# Patient Record
Sex: Male | Born: 1960 | Race: White | Hispanic: No | Marital: Married | Smoking: Former smoker
Health system: Southern US, Community
[De-identification: ages and names within clinical notes are randomized; demographics above are authoritative.]

## PROBLEM LIST (undated history)

## (undated) DIAGNOSIS — J84112 Idiopathic pulmonary fibrosis: Secondary | ICD-10-CM

## (undated) DIAGNOSIS — I639 Cerebral infarction, unspecified: Secondary | ICD-10-CM

## (undated) DIAGNOSIS — C801 Malignant (primary) neoplasm, unspecified: Secondary | ICD-10-CM

## (undated) DIAGNOSIS — Z9289 Personal history of other medical treatment: Secondary | ICD-10-CM

## (undated) DIAGNOSIS — Z85118 Personal history of other malignant neoplasm of bronchus and lung: Secondary | ICD-10-CM

## (undated) DIAGNOSIS — G43909 Migraine, unspecified, not intractable, without status migrainosus: Secondary | ICD-10-CM

## (undated) DIAGNOSIS — I2510 Atherosclerotic heart disease of native coronary artery without angina pectoris: Secondary | ICD-10-CM

## (undated) DIAGNOSIS — J8410 Pulmonary fibrosis, unspecified: Secondary | ICD-10-CM

## (undated) DIAGNOSIS — J449 Chronic obstructive pulmonary disease, unspecified: Secondary | ICD-10-CM

## (undated) DIAGNOSIS — T63301A Toxic effect of unspecified spider venom, accidental (unintentional), initial encounter: Secondary | ICD-10-CM

## (undated) DIAGNOSIS — J45909 Unspecified asthma, uncomplicated: Secondary | ICD-10-CM

## (undated) DIAGNOSIS — M549 Dorsalgia, unspecified: Secondary | ICD-10-CM

## (undated) DIAGNOSIS — R1310 Dysphagia, unspecified: Secondary | ICD-10-CM

## (undated) DIAGNOSIS — G1221 Amyotrophic lateral sclerosis: Secondary | ICD-10-CM

## (undated) DIAGNOSIS — M5136 Other intervertebral disc degeneration, lumbar region: Secondary | ICD-10-CM

## (undated) DIAGNOSIS — F329 Major depressive disorder, single episode, unspecified: Secondary | ICD-10-CM

## (undated) DIAGNOSIS — M51369 Other intervertebral disc degeneration, lumbar region without mention of lumbar back pain or lower extremity pain: Secondary | ICD-10-CM

## (undated) DIAGNOSIS — F32A Depression, unspecified: Secondary | ICD-10-CM

## (undated) HISTORY — DX: Migraine, unspecified, not intractable, without status migrainosus: G43.909

## (undated) HISTORY — PX: CARDIAC CATHETERIZATION: SHX172

## (undated) HISTORY — DX: Other intervertebral disc degeneration, lumbar region without mention of lumbar back pain or lower extremity pain: M51.369

## (undated) HISTORY — DX: Idiopathic pulmonary fibrosis: J84.112

## (undated) HISTORY — DX: Amyotrophic lateral sclerosis: G12.21

## (undated) HISTORY — DX: Malignant (primary) neoplasm, unspecified: C80.1

## (undated) HISTORY — DX: Personal history of other malignant neoplasm of bronchus and lung: Z85.118

## (undated) HISTORY — DX: Other intervertebral disc degeneration, lumbar region: M51.36

## (undated) HISTORY — DX: Cerebral infarction, unspecified: I63.9

## (undated) HISTORY — PX: BACK SURGERY: SHX140

## (undated) HISTORY — DX: Atherosclerotic heart disease of native coronary artery without angina pectoris: I25.10

---
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
## 1988-08-01 HISTORY — PX: OTHER SURGICAL HISTORY: SHX169

## 1988-08-01 HISTORY — PX: VASECTOMY: SHX75

## 1998-08-01 HISTORY — PX: LUMBAR FUSION: SHX111

## 1999-07-11 ENCOUNTER — Encounter: Payer: Self-pay | Admitting: Emergency Medicine

## 1999-07-11 ENCOUNTER — Emergency Department (HOSPITAL_COMMUNITY): Admission: EM | Admit: 1999-07-11 | Discharge: 1999-07-11 | Payer: Self-pay | Admitting: Emergency Medicine

## 1999-08-02 HISTORY — PX: CORONARY ANGIOPLASTY: SHX604

## 1999-09-27 ENCOUNTER — Encounter: Admission: RE | Admit: 1999-09-27 | Discharge: 1999-09-27 | Payer: Self-pay | Admitting: *Deleted

## 1999-10-27 ENCOUNTER — Encounter: Payer: Self-pay | Admitting: Neurosurgery

## 1999-10-29 ENCOUNTER — Encounter: Payer: Self-pay | Admitting: Neurosurgery

## 1999-10-29 ENCOUNTER — Inpatient Hospital Stay (HOSPITAL_COMMUNITY): Admission: RE | Admit: 1999-10-29 | Discharge: 1999-11-01 | Payer: Self-pay | Admitting: Neurosurgery

## 1999-10-31 ENCOUNTER — Encounter: Payer: Self-pay | Admitting: Neurosurgery

## 1999-12-14 ENCOUNTER — Encounter: Payer: Self-pay | Admitting: Neurosurgery

## 1999-12-14 ENCOUNTER — Encounter: Admission: RE | Admit: 1999-12-14 | Discharge: 1999-12-14 | Payer: Self-pay | Admitting: Neurosurgery

## 2000-04-25 ENCOUNTER — Encounter: Payer: Self-pay | Admitting: Neurosurgery

## 2000-04-25 ENCOUNTER — Encounter: Admission: RE | Admit: 2000-04-25 | Discharge: 2000-04-25 | Payer: Self-pay | Admitting: Neurosurgery

## 2000-06-05 ENCOUNTER — Encounter: Payer: Self-pay | Admitting: Neurosurgery

## 2000-06-05 ENCOUNTER — Encounter: Admission: RE | Admit: 2000-06-05 | Discharge: 2000-06-05 | Payer: Self-pay | Admitting: Neurosurgery

## 2000-06-20 ENCOUNTER — Encounter: Admission: RE | Admit: 2000-06-20 | Discharge: 2000-06-20 | Payer: Self-pay | Admitting: Neurosurgery

## 2000-06-20 ENCOUNTER — Encounter: Payer: Self-pay | Admitting: Neurosurgery

## 2001-02-06 ENCOUNTER — Emergency Department (HOSPITAL_COMMUNITY): Admission: EM | Admit: 2001-02-06 | Discharge: 2001-02-06 | Payer: Self-pay | Admitting: *Deleted

## 2001-02-16 ENCOUNTER — Emergency Department (HOSPITAL_COMMUNITY): Admission: EM | Admit: 2001-02-16 | Discharge: 2001-02-16 | Payer: Self-pay

## 2001-05-28 ENCOUNTER — Emergency Department (HOSPITAL_COMMUNITY): Admission: EM | Admit: 2001-05-28 | Discharge: 2001-05-29 | Payer: Self-pay | Admitting: Unknown Physician Specialty

## 2003-07-09 ENCOUNTER — Emergency Department (HOSPITAL_COMMUNITY): Admission: EM | Admit: 2003-07-09 | Discharge: 2003-07-09 | Payer: Self-pay | Admitting: Emergency Medicine

## 2004-08-04 ENCOUNTER — Ambulatory Visit: Payer: Self-pay | Admitting: Cardiology

## 2004-11-01 ENCOUNTER — Emergency Department (HOSPITAL_COMMUNITY): Admission: EM | Admit: 2004-11-01 | Discharge: 2004-11-01 | Payer: Self-pay | Admitting: Emergency Medicine

## 2005-04-21 ENCOUNTER — Emergency Department (HOSPITAL_COMMUNITY): Admission: EM | Admit: 2005-04-21 | Discharge: 2005-04-22 | Payer: Self-pay | Admitting: Emergency Medicine

## 2007-08-02 HISTORY — PX: CHOLECYSTECTOMY: SHX55

## 2010-08-01 DIAGNOSIS — T63301A Toxic effect of unspecified spider venom, accidental (unintentional), initial encounter: Secondary | ICD-10-CM

## 2010-08-01 HISTORY — DX: Toxic effect of unspecified spider venom, accidental (unintentional), initial encounter: T63.301A

## 2011-08-29 ENCOUNTER — Encounter (HOSPITAL_COMMUNITY): Payer: Self-pay | Admitting: *Deleted

## 2011-08-29 ENCOUNTER — Emergency Department (HOSPITAL_COMMUNITY)
Admission: EM | Admit: 2011-08-29 | Discharge: 2011-08-30 | Disposition: A | Discharge status: Home / Self Care | Payer: BC Managed Care – PPO | Attending: Emergency Medicine | Admitting: Emergency Medicine

## 2011-08-29 DIAGNOSIS — Z7982 Long term (current) use of aspirin: Secondary | ICD-10-CM | POA: Insufficient documentation

## 2011-08-29 DIAGNOSIS — W108XXA Fall (on) (from) other stairs and steps, initial encounter: Secondary | ICD-10-CM | POA: Insufficient documentation

## 2011-08-29 DIAGNOSIS — M549 Dorsalgia, unspecified: Secondary | ICD-10-CM

## 2011-08-29 DIAGNOSIS — M545 Low back pain, unspecified: Secondary | ICD-10-CM | POA: Insufficient documentation

## 2011-08-29 DIAGNOSIS — G8929 Other chronic pain: Secondary | ICD-10-CM | POA: Insufficient documentation

## 2011-08-29 DIAGNOSIS — W19XXXA Unspecified fall, initial encounter: Secondary | ICD-10-CM

## 2011-08-29 DIAGNOSIS — F17200 Nicotine dependence, unspecified, uncomplicated: Secondary | ICD-10-CM | POA: Insufficient documentation

## 2011-08-29 DIAGNOSIS — Z981 Arthrodesis status: Secondary | ICD-10-CM | POA: Insufficient documentation

## 2011-08-29 HISTORY — DX: Dorsalgia, unspecified: M54.9

## 2011-08-29 MED ORDER — IBUPROFEN 800 MG PO TABS
800.0000 mg | ORAL_TABLET | Freq: Once | ORAL | Status: AC
  Administered 2011-08-30: 800 mg via ORAL
  Filled 2011-08-29: qty 1

## 2011-08-29 MED ORDER — OXYCODONE-ACETAMINOPHEN 5-325 MG PO TABS
2.0000 | ORAL_TABLET | Freq: Once | ORAL | Status: AC
  Administered 2011-08-30: 2 via ORAL
  Filled 2011-08-29: qty 2

## 2011-08-29 NOTE — ED Notes (Signed)
C/o lower back pain s/p fall down six steps this afternoon; hx of chronic back pain/back surgery

## 2011-08-30 ENCOUNTER — Emergency Department (HOSPITAL_COMMUNITY): Payer: BC Managed Care – PPO

## 2011-08-30 MED ORDER — OXYCODONE-ACETAMINOPHEN 5-325 MG PO TABS: 1.0000 | ORAL_TABLET | ORAL | Status: AC | PRN

## 2011-08-30 MED ORDER — IBUPROFEN 800 MG PO TABS: 800.0000 mg | ORAL_TABLET | Freq: Three times a day (TID) | ORAL | Status: DC

## 2011-08-30 NOTE — ED Notes (Signed)
Pt reports he fell down stairs.  Reports severe pain in lower back.  Pt does grimace with movement and reports sharp stabbing pain with position changes. Medicated for pain.

## 2011-08-30 NOTE — ED Provider Notes (Addendum)
History     CSN: [id number]  Arrival date & time 08/29/11  2335   First MD Initiated Contact with Patient 08/29/11 2350      Chief Complaint  Patient presents with  . Fall  . Back Pain    lower    (Consider location/radiation/quality/duration/timing/severity/associated sxs/prior treatment) HPI Bradley Rice is a 51 y.o. male who presents to the Emergency Department complaining of back pain after falling down 6 stairs. He has a h/o chronic pain to the lower back after having back surgery.Has not taken any OTC medicines.  Past Medical History  Diagnosis Date  . Back pain     Past Surgical History  Procedure Date  . Cholecystectomy     No family history on file.  History  Substance Use Topics  . Smoking status: Current Everyday Smoker -- 0.5 packs/day    Types: Cigarettes  . Smokeless tobacco: Not on file  . Alcohol Use: No      Review of Systems A 10 review of systems reviewed and are negative for acute change except as noted in the HPI. Allergies  Review of patient's allergies indicates no known allergies.  Home Medications   Current Outpatient Rx  Name Route Sig Dispense Refill  . ASPIRIN 325 MG PO TABS Oral Take 325 mg by mouth daily.      Ht 6' (1.829 m)  Wt 185 lb (83.915 kg)  BMI 25.09 kg/m2  Physical Exam  Nursing note and vitals reviewed. Constitutional: He is oriented to person, place, and time. He appears well-developed and well-nourished.  HENT:  Head: Normocephalic.  Right Ear: External ear normal.  Nose: Nose normal.  Mouth/Throat: Oropharynx is clear and moist.  Eyes: EOM are normal. Pupils are equal, round, and reactive to light.  Neck: Normal range of motion. Neck supple.  Cardiovascular: Normal rate, normal heart sounds and intact distal pulses.   Pulmonary/Chest: Effort normal and breath sounds normal.  Abdominal: Soft. Bowel sounds are normal.  Musculoskeletal:       No spinal pain to percussion. Mild lumbar paraspinal tenderness  left greater than right with palpation.  Neurological: He is alert and oriented to person, place, and time. He has normal reflexes.  Skin: Skin is warm and dry.    ED Course  Procedures (including critical care time)  Labs Reviewed - No data to display Dg Lumbar Spine Complete  08/30/2011  *RADIOLOGY REPORT*  Clinical Data: Larey Seat on steps, prior surgery  LUMBAR SPINE - COMPLETE 4+ VIEW  Comparison: None available  Findings: Five non-rib bearing lumbar vertebrae. Osseous demineralization. Prior L5-S1 fusion and L5 laminectomy. Mild scattered end plate spur formation. Disc space narrowing L2-L3, L3-L4. Vertebral body heights maintained without fracture or subluxation. Hardware appears intact. No bone destruction. SI joints symmetric.  IMPRESSION: Prior L5-S1 fusion. Mild degenerative disc disease changes as above. No acute bony abnormalities.  Original Report Authenticated By: Lollie Marrow, M.D.       MDM  Patient with chronic back pain, s/p fusion, who fell and is having lowe back pain. Xray with no acute findings. Given analgesics and antiinflammatory.Pt stable in ED with no significant deterioration in condition.The patient appears reasonably screened and/or stabilized for discharge and I doubt any other medical condition or other [facility] requiring further screening, evaluation, or treatment in the ED at this time prior to discharge. Referral to orthopedics if needed.  MDM Reviewed: nursing note and vitals Interpretation: x-ray          Nicoletta Dress.  Colon Branch, MD 08/30/11 0050  Nicoletta Dress. Colon Branch, MD 09/20/11 1724

## 2011-08-30 NOTE — ED Notes (Signed)
Patient transported to X-ray 

## 2011-09-02 ENCOUNTER — Emergency Department (HOSPITAL_COMMUNITY): Payer: BC Managed Care – PPO

## 2011-09-02 ENCOUNTER — Emergency Department (HOSPITAL_COMMUNITY)
Admission: EM | Admit: 2011-09-02 | Discharge: 2011-09-02 | Disposition: A | Discharge status: Home / Self Care | Payer: BC Managed Care – PPO | Attending: Emergency Medicine | Admitting: Emergency Medicine

## 2011-09-02 ENCOUNTER — Encounter (HOSPITAL_COMMUNITY): Payer: Self-pay | Admitting: *Deleted

## 2011-09-02 DIAGNOSIS — M545 Low back pain, unspecified: Secondary | ICD-10-CM | POA: Insufficient documentation

## 2011-09-02 DIAGNOSIS — F17200 Nicotine dependence, unspecified, uncomplicated: Secondary | ICD-10-CM | POA: Insufficient documentation

## 2011-09-02 DIAGNOSIS — M25559 Pain in unspecified hip: Secondary | ICD-10-CM | POA: Insufficient documentation

## 2011-09-02 DIAGNOSIS — R52 Pain, unspecified: Secondary | ICD-10-CM | POA: Insufficient documentation

## 2011-09-02 MED ORDER — PREDNISONE 20 MG PO TABS
60.0000 mg | ORAL_TABLET | Freq: Once | ORAL | Status: AC
  Administered 2011-09-02: 60 mg via ORAL
  Filled 2011-09-02: qty 3

## 2011-09-02 MED ORDER — OXYCODONE-ACETAMINOPHEN 7.5-325 MG PO TABS: 1.0000 | ORAL_TABLET | ORAL | Status: DC | PRN

## 2011-09-02 MED ORDER — PREDNISONE 20 MG PO TABS: ORAL_TABLET | ORAL | Status: DC

## 2011-09-02 MED ORDER — OXYCODONE-ACETAMINOPHEN 5-325 MG PO TABS
2.0000 | ORAL_TABLET | Freq: Once | ORAL | Status: AC
  Administered 2011-09-02: 2 via ORAL
  Filled 2011-09-02: qty 2

## 2011-09-02 MED ORDER — HYDROMORPHONE HCL PF 1 MG/ML IJ SOLN
1.0000 mg | Freq: Once | INTRAMUSCULAR | Status: AC
  Administered 2011-09-02: 1 mg via INTRAMUSCULAR
  Filled 2011-09-02: qty 1

## 2011-09-02 NOTE — ED Notes (Signed)
Pt to return on Monday at 4 pm for MRI. Pt verbalized understanding.

## 2011-09-02 NOTE — ED Notes (Signed)
Pt a/ox4. Resp even and unlabored. NAD at this time. D/C instructions reviewed with pt. Pt verbalized understanding. Pt ambulated to lobby with steady gate.  

## 2011-09-02 NOTE — ED Notes (Signed)
Pt states that he fell on Monday, states that he was he coming down steps and fell, was seen in er on Monday, but is not able to be seen by [facility] specialist in [place] until two weeks from today, pt states that he continues to have back pain,

## 2011-09-04 NOTE — ED Provider Notes (Signed)
History     CSN: [id number]  Arrival date & time 09/02/11  7829   First MD Initiated Contact with Patient 09/02/11 1934      Chief Complaint  Patient presents with  . Back Pain    (Consider location/radiation/quality/duration/timing/severity/associated sxs/prior treatment) Patient is a 51 y.o. male presenting with back pain. The history is provided by the patient.  Back Pain  Chronicity: History of prior back injury and surgery, fell 4 days ago coming down a flight of 6 steps.  He landed first on his feet,  then fell backward onto his buttocks. The problem occurs constantly. The problem has not changed (He was seen here the day of the fall,  back xrays negative for acute injury,  but reports oxycodone prescribed has not relieved his pain.  He is scheduled to see [facility] neurosurgeon in 2 weeks.) since onset.The pain is present in the lumbar spine. The quality of the pain is described as stabbing and shooting. The pain radiates to the left foot (He also reports that if he sits straight with pressure on his coccyx,  he has pain that radiates to his posterior head.). The pain is at a severity of 10/10. The pain is severe. The symptoms are aggravated by certain positions. The pain is the same all the time. Pertinent negatives include no chest pain, no fever, no numbness, no headaches, no abdominal pain, no bowel incontinence, no perianal numbness, no bladder incontinence, no dysuria, no paresthesias, no paresis and no weakness. He has tried bed rest and analgesics for the symptoms. The treatment provided no relief.    Past Medical History  Diagnosis Date  . Back pain     Past Surgical History  Procedure Date  . Cholecystectomy   . Back surgery     History reviewed. No pertinent family history.  History  Substance Use Topics  . Smoking status: Current Everyday Smoker -- 0.5 packs/day    Types: Cigarettes  . Smokeless tobacco: Not on file  . Alcohol Use: No      Review of Systems   Constitutional: Negative for fever.  HENT: Negative for congestion, sore throat and neck pain.   Eyes: Negative.   Respiratory: Negative for chest tightness and shortness of breath.   Cardiovascular: Negative for chest pain.  Gastrointestinal: Negative for nausea, abdominal pain and bowel incontinence.  Genitourinary: Negative.  Negative for bladder incontinence and dysuria.  Musculoskeletal: Positive for back pain. Negative for joint swelling and arthralgias.  Skin: Negative.  Negative for rash and wound.  Neurological: Negative for dizziness, weakness, light-headedness, numbness, headaches and paresthesias.  Hematological: Negative.   Psychiatric/Behavioral: Negative.     Allergies  Review of patient's allergies indicates no known allergies.  Home Medications   Current Outpatient Rx  Name Route Sig Dispense Refill  . ASPIRIN EC 325 MG PO TBEC Oral Take 325 mg by mouth daily.    . IBUPROFEN 200 MG PO TABS Oral Take 800 mg by mouth 3 (three) times daily as needed. For pain    . OXYCODONE-ACETAMINOPHEN 5-325 MG PO TABS Oral Take 1 tablet by mouth every 4 (four) hours as needed for pain. 20 tablet 0  . OXYCODONE-ACETAMINOPHEN 7.5-325 MG PO TABS Oral Take 1 tablet by mouth every 4 (four) hours as needed for pain. 30 tablet 0  . PREDNISONE 20 MG PO TABS  Take 6 tabs daily by mouth for 2 day,  Then 5 tabs daily for 2 days,  4 tabs daily for 2 days,  3 tabs daily for 2 days,  2 tabs daily for 2 days,  Then 1 tab daily for 2 days. 42 tablet 0    BP 114/73  Pulse 92  Temp(Src) 98.1 F (36.7 C) (Oral)  Resp 20  Ht 6' (1.829 m)  Wt 180 lb (81.647 kg)  BMI 24.41 kg/m2  SpO2 98%  Physical Exam  Nursing note and vitals reviewed. Constitutional: He is oriented to person, place, and time. He appears well-developed and well-nourished.  HENT:  Head: Normocephalic and atraumatic.  Eyes: Conjunctivae are normal.  Neck: Normal range of motion. Neck supple.  Cardiovascular: Normal rate,  regular rhythm, normal heart sounds and intact distal pulses.        Pedal pulses normal.  Pulmonary/Chest: Effort normal and breath sounds normal. He has no wheezes.  Abdominal: Soft. Bowel sounds are normal. He exhibits no distension and no mass. There is no tenderness.  Musculoskeletal: Normal range of motion. He exhibits no edema.       Lumbar back: He exhibits tenderness. He exhibits no swelling, no edema and no spasm.  Neurological: He is alert and oriented to person, place, and time. He has normal strength. He displays no atrophy, no tremor and normal reflexes. No cranial nerve deficit or sensory deficit. Coordination and gait normal.  Reflex Scores:      Patellar reflexes are 2+ on the right side and 2+ on the left side.      Achilles reflexes are 2+ on the right side and 2+ on the left side.      No strength deficit noted in hip and knee flexor and extensor muscle groups.  Ankle flexion and extension intact.  Skin: Skin is warm and dry.  Psychiatric: He has a normal mood and affect.    ED Course  Procedures (including critical care time)  Labs Reviewed - No data to display Dg Hip Bilateral W/pelvis  09/02/2011  *RADIOLOGY REPORT*  Clinical Data: Bilateral hip pain status post fall.  BILATERAL HIP WITH PELVIS - 4+ VIEW  Comparison: 08/30/2011 lumbar spine radiograph  Findings: Partially imaged L5 S1 fusion hardware.  No acute fracture or aggressive osseous lesion.  Bone island within the left femoral neck.  No dislocation.  IMPRESSION: No acute osseous abnormality identified.  Original Report Authenticated By: Waneta Martins, M.D.     1. Lumbosacral pain   2. Pain    Patient given Dilaudid 1 mg IM injection with moderate relief of symptoms with pain 6/10 from 10 out of 10 at arrival. Prednisone 60 mg by mouth, oxycodone 2 tablets given prior to discharge home.  MDM  The patient was scheduled for outpatient MRI in 2 days here. Plan for follow up with a [facility] neurology as  already scheduled. Strict instructions on symptoms which should prompt an emergent reevaluation.        Candis Musa, PA 09/04/11 [phone]

## 2011-09-05 ENCOUNTER — Ambulatory Visit (HOSPITAL_COMMUNITY)
Admit: 2011-09-05 | Discharge: 2011-09-05 | Disposition: A | Discharge status: Home / Self Care | Payer: BC Managed Care – PPO | Source: Ambulatory Visit | Attending: Emergency Medicine | Admitting: Emergency Medicine

## 2011-09-05 DIAGNOSIS — M545 Low back pain, unspecified: Secondary | ICD-10-CM | POA: Insufficient documentation

## 2011-09-05 DIAGNOSIS — IMO0002 Reserved for concepts with insufficient information to code with codable children: Secondary | ICD-10-CM | POA: Insufficient documentation

## 2011-09-05 DIAGNOSIS — M5126 Other intervertebral disc displacement, lumbar region: Secondary | ICD-10-CM | POA: Insufficient documentation

## 2011-09-05 DIAGNOSIS — W19XXXA Unspecified fall, initial encounter: Secondary | ICD-10-CM | POA: Insufficient documentation

## 2011-09-05 NOTE — ED Provider Notes (Signed)
Patient with fall several days PTA that is not improving. He has chronic back pain and is scheduled to see a neurologist this coming week. Cor: RRR Chest: Clear Back: pulls away with percussion to the lower spine, no pain with palpation.   Medical screening examination/treatment/procedure(s) were conducted as a shared visit with non-physician practitioner(s) and myself.  I personally evaluated the patient during the encounter  Nicoletta Dress. Colon Branch, MD 09/05/11 8119

## 2011-12-16 ENCOUNTER — Encounter (HOSPITAL_COMMUNITY): Payer: Self-pay | Admitting: Pharmacy Technician

## 2011-12-19 ENCOUNTER — Inpatient Hospital Stay (HOSPITAL_COMMUNITY): Admission: RE | Admit: 2011-12-19 | Discharge: 2011-12-19 | Payer: BC Managed Care – PPO | Source: Ambulatory Visit

## 2011-12-19 ENCOUNTER — Encounter (HOSPITAL_COMMUNITY): Payer: Self-pay

## 2011-12-19 HISTORY — DX: Toxic effect of unspecified spider venom, accidental (unintentional), initial encounter: T63.301A

## 2011-12-19 NOTE — H&P (Signed)
Bradley Rice 12/19/2011 9:15 AM Location: [facility] Patient #: [id number] DOB: 11-Apr-1961 Married / Language: English / Race: White Male   History of Present Illness(Bradley Rice [name], PA-C; 12/19/2011 9:58 AM) The patient is a 51 year old male who comes in today for a preoperative History and Physical. The patient is scheduled for a ACDF C5- T1 (cervical spondylotic radiculopathy) to be performed by Dr. Debria Garret D. Shon Baton, MD at [facility] on Thursday, Dec 22, 2011 at 1130AM . Please see the hospital record for complete dictated history and physical.    Allergies(Lori Zipporah Plants; 12/19/2011 9:18 AM) No Known Drug Allergies. 09/13/2011   Social History(Lori W Randa Lynn; 12/19/2011 9:18 AM) Tobacco use. current every day smoker; smoke(d) 1 pack(s) per day Alcohol use. never consumed alcohol Children. 2 Current work status. working full time Drug/Alcohol Rehab (Currently). no Exercise. Exercises rarely; does running / walking Illicit drug use. no Living situation. live with spouse Marital status. married Most recent primary occupation. Warehouse Work Number of flights of stairs before winded. 2-3 Pain Contract. no Previously in rehab. no Tobacco / smoke exposure. yes   Medication History(Lori W Randa Lynn; 12/19/2011 9:19 AM) Percocet (5-325MG  Tablet, 1 (one) Oral every 8 hours, Taken starting 12/09/2011) Active. Aspirin EC (325MG  Tablet DR, Oral) Active. (was taken on 051913; instrusted not to take another dose)   Past Surgical History(Lori W Lamb; 12/19/2011 9:18 AM) Gallbladder Surgery. laporoscopic Spinal Fusion. lower back Spinal Surgery   Review of Systems(Donnisha Besecker J [facility], PA-C; 12/19/2011 9:59 AM) General:Present- Appetite Loss and Weight Loss. Not Present- Chills, Fever, Night Sweats, Fatigue, Feeling sick and Weight Gain. Skin:Not Present- Itching, Rash, Skin Color Changes, Ulcer, Psoriasis and Change in Hair or Nails. HEENT:Not Present-  Sensitivity to light, Nose Bleed, Visual Loss, Decreased Hearing and Ringing in the Ears. Neck:Not Present- Swollen Glands and Neck Mass. Cardiovascular:Present- Swelling of Extremities. Not Present- Shortness of Breath, Chest Pain, Leg Cramps and Palpitations. Gastrointestinal:Not Present- Bloody Stool, Heartburn, Abdominal Pain, Vomiting, Nausea and Incontinence of Stool. Male Genitourinary:Not Present- Blood in Urine, Frequency, Incontinence and Nocturia. Musculoskeletal:Present- Muscle Pain, Joint Pain and Back Pain. Not Present- Muscle Weakness, Joint Stiffness and Joint Swelling. Neurological:Present- Tingling, Numbness and Headaches. Not Present- Burning, Tremor and Dizziness. Psychiatric:Not Present- Anxiety, Depression and Memory Loss. Endocrine:Not Present- Cold Intolerance, Heat Intolerance, Excessive hunger and Excessive Thirst. Hematology:Not Present- Abnormal Bleeding, Abnormal bruising, Anemia and Blood Clots.   Vitals(Lori W Lamb; 12/19/2011 9:21 AM) 12/19/2011 9:19 AM Weight: 172 lb Height: 72 in Body Surface Area: 1.99 m Body Mass Index: 23.33 kg/m Pulse: 90 (Regular) BP: 119/85 (Sitting, Left Arm, Standard)    Physical Exam(Noella Kipnis J Clarisse Rodriges, PA-C; 12/19/2011 10:40 AM) The physical exam findings are as follows:   General General Appearance- pleasant. Not in acute distress. Orientation- Oriented X3. Build & Nutrition- Well nourished and Well developed. Posture- Normal posture. Gait- Normal. Mental Status- Alert.   Integumentary General Characteristics:Surgical Scars- no surgical scar evidence of previous cervical surgery. Cervical Spine- Skin examination of the cervical spine is without deformity, skin lesions, lacerations or abrasions.   Head and Neck Neck Global Assessment- supple. no lymphadenopathy and no nucchal rigidty.   Eye Pupil- Bilateral- Normal, Direct reaction to light normal, Equal and Regular. Motion-  Bilateral- EOMI.   Chest and Lung Exam Auscultation: Breath sounds:- Clear.   Cardiovascular Auscultation:Rhythm- Regular rate and rhythm. Heart Sounds- Normal heart sounds.   Abdomen Palpation/Percussion:Palpation and Percussion of the abdomen reveal - Non Tender, No Rebound tenderness and Soft.  Peripheral Vascular Upper Extremity: Palpation:Radial pulse- Bilateral- 2+. Lower Extremity:Inspection- Bilateral- Inspection Normal. Palpation:Posterior tibial pulse- Bilateral- 2+. Dorsalis pedis pulse- Bilateral- 2+.   Neurologic Sensation:Upper Extremity- Bilateral- sensation is intact in the upper extremity. Lower Extremity- Bilateral- sensation is intact in the lower extremity. Reflexes:Biceps Reflex- Bilateral- 2+. Brachioradialis Reflex- Bilateral- 2+. Triceps Reflex- Bilateral- 2+. Patellar Reflex- Bilateral- 2+. Achilles Reflex- Bilateral- 2+. Babinski- Bilateral- Babinski not present. Clonus- Bilateral- clonus not present. Hoffman's Sign- Bilateral- Hoffman's sign not present.   Musculoskeletal Spine/Ribs/Pelvis Cervical Spine : Inspection and Palpation:Tenderness- no soft tissue tenderness to palpation and no bony tenderness to palpation. bony/soft tissue palpation of the cervical spine and shoulders does not recreate their typical pain. Strength and Tone: Strength:Deltoid- Left- 4/5. Right- 5/5. Biceps- Left- 4/5. Right- 5/5. Triceps- Left- 4/5. Right- 5/5. Wrist Extension- Bilateral- 5/5. Hand Grip- Bilateral- 5/5. Heel walk- Bilateral- able to heel walk without difficulty. Toe Walk- Bilateral- able to walk on toes without difficulty. Heel-Toe Walk- Bilateral- able to heel-toe walk without difficulty. ROM- Flexion- Moderately Decreased and painful. Extension- Moderately Decreased and painful. Left Rotation - Moderately Decreased and painful. Right Rotation - Moderately Decreased and painful. Pain:-  neither flexion or extension is more painful than the other. Non-Anatomic Signs- No non-anatomic signs present. Upper Extremity Range of Motion:- No truesholder pain with IR/ER of the shoulders.   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  Pain, Cervical (723.1)  Note: unfortunately conservative measures consisting of observation, activity modification, physical therapy, oral pain medications and injection therapy have failed to alleviate his symptoms and given the ongoing nature of his pain and the decrease in his quality of life, he wishes to proceed with surgery. Risks/benefits/alternatives to surgery/ expectations following the procedure have been discussed with the patient by D. Brooks.   MRI of the cervical spine dated 09/22/11 demonstrates C5-6 mild disc bulge with endplate spur effaces the thecal sac without cord deformity. Moderate to severe left foraminal narrowing due to uncovertebral hypertrophy affects the left C7 dorsal root ganglion. C6-7 disc bulging with endplate spurs does not deform the cord. Moderate to severe left foraminal narrowing due to uncovertebral hypertrophy with mass effect on the left C7 dorsal root ganglion. C7-T1 small left posterolateral and lateral disc herniation with moderate left foraminal stenosis likely affects the left C8 root. Asymmetric disc bulging with endplate spur to the right with moderate right foraminal stenosis affects the C8 root. No midline canal stenosis or cord deformity. Severe disc degeneration.  He has not been fitted for an ASPEN collar and is scheduled to do so tomorrow at 2pm. He will also complete his pre-op hospital requirements tomorrow at 3 PM. All of his questions have been encouraged, addressed and answered. Plan, at this time is to proceed with surgery as  scheduled.   Signed electronically by Gwinda Maine, PA-C (12/19/2011 10:50 AM)    Bradley Rice 11/14/2011 9:43 AM Location: [facility] Patient #: [id number] DOB: [birth year] Married / Language: [place] / Race: White Male   History of Present Illness(Lori Zipporah Plants; 11/14/2011 9:48 AM) The patient is a 51 year old male who presents today for follow up of their neck. The patient is being followed for their central (radiating into the right upper ext. to the level of the elbow. Also states the pain radiates into the lumbar region and left lower ext. ) neck pain.  Symptoms reported today include: pain, weakness, numbness (radiating into the left upper ext. the level of the elbow) and leg pain (left ). The patient feels that they are doing poorly. Current treatment includes: activity modification and pain medications. The following medication has been used for pain control: Percocet (5/325). The patient presents today following ESI (cervical [id number] performed by Dr. Ethelene Hal ). The patient has not gotten any relief of their symptoms with Cortisone injections (had headache after injection with no relief ).    Subjective Transcription(DAHARI Sheela Stack, MD; 11/15/2011 1:51 PM)  Bradley Rice returns today for forward flexion. The injection (cervical epidural steroid injection) did not provide any significant relief, in fact, he states that he had a significant headache following the injection. At this point, because of the failure of conservative management which has consisted of physical therapy, injection therapy, observation, pain medication and activity modification, the patient would like to proceed with a surgical solution for his neck.    Allergies(Lori W Randa Lynn; 11/14/2011 9:47 AM) No Known Drug Allergies. 09/13/2011   Social History(Lori W Randa Lynn; 11/14/2011 9:47 AM) Tobacco use. current every day smoker; smoke(d) 1 pack(s) per day   Medication History(Lori W Lamb; 11/14/2011  9:47 AM) Percocet (5-325MG  Tablet, 1 (one) Oral every 8 hours, Taken starting 10/10/2011) Active.   Past Surgical History(Lori Zipporah Plants; 11/14/2011 9:48 AM) Gallbladder Surgery. laporoscopic Spinal Fusion. lower back Spinal Surgery   Objective Transcription(DAHARI Sheela Stack, MD; 11/15/2011 1:51 PM)  RADIOGRAPHS:  We did get plain x-rays today. They show loss of cervical lordosis with multilevel degenerative cervical spondylitic disease C5-6, C6-7, C7-T1.    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  Pain, Cervical (723.1)   Assessments Transcription(DAHARI D BROOKS, MD; 11/15/2011 1:51 PM)  At this point, I do think the patient has symptomatic C5-6, C6-7 disease and possible C7-T1 disease.    Plans Transcription(DAHARI D BROOKS, MD; 11/15/2011 1:51 PM)  I would like to get a nerve conduction study to the upper extremity simply to rule out the evidence of a peripheral neuropathy. If the patient does have a double crush phenomenon, then we may need to consider a staged procedure where we address the peripheral neuropathy first and then the central neuropathy. Right now the patient would require either a two-level or three-level anterior cervical diskectomy and fusion C5-6, C6-7 and possibly C7-T1. The risks, as I have explained to the patient, include infection, bleeding, nerve damage, death, stroke, paralysis, loss of bowel and bladder control, need for further surgery, ongoing or worse pain, nonunion meaning it does not fuse, the levels above and below can breakdown and the requirement for additional surgery including posterior cervical surgery. The goal of this operation would be to reduce not eliminate the patient's pain so that we could improve his quality of life.  At this point, all of the patient's questions were encouraged and addressed. I will see the patient back after the nerve conduction study. If that does not demonstrate any significant evidence of peripheral neuropathy, then we will proceed with the cervical multilevel anterior cervical diskectomy and fusion. Since the patient is a smoker and this is a multilevel procedure he will need an external bone stimulator following the procedure.    Miscellaneous Transcription(DAHARI Sheela Stack, MD; 11/15/2011 1:51 PM)  Debria Garret D. Shon Baton, MD/kro  T: 11/15/2011  D: 11/14/2011    Signed electronically by Alvy Beal, MD (11/14/2011 4:17 PM)

## 2011-12-20 ENCOUNTER — Encounter (HOSPITAL_COMMUNITY)
Admission: RE | Admit: 2011-12-20 | Discharge: 2011-12-20 | Disposition: A | Discharge status: Home / Self Care | Payer: BC Managed Care – PPO | Source: Ambulatory Visit | Attending: Orthopedic Surgery | Admitting: Orthopedic Surgery

## 2011-12-20 ENCOUNTER — Encounter (HOSPITAL_COMMUNITY)
Admission: RE | Admit: 2011-12-20 | Discharge: 2011-12-20 | Disposition: A | Discharge status: Home / Self Care | Payer: BC Managed Care – PPO | Source: Ambulatory Visit | Attending: Physician Assistant | Admitting: Physician Assistant

## 2011-12-20 LAB — BASIC METABOLIC PANEL
BUN: 6 mg/dL (ref 6–23)
Chloride: 100 mEq/L (ref 96–112)
GFR calc Af Amer: >90 mL/min (ref >90)
Glucose, Bld: 81 mg/dL (ref 70–99)
Potassium: 3.7 mEq/L (ref 3.5–5.1)
Sodium: 137 mEq/L (ref 135–145)

## 2011-12-20 LAB — SURGICAL PCR SCREEN
MRSA, PCR: NEGATIVE
Staphylococcus aureus: NEGATIVE

## 2011-12-20 LAB — CBC
HCT: 42 % (ref 39.0–52.0)
Hemoglobin: 14.6 g/dL (ref 13.0–17.0)
MCH: 32.8 pg (ref 26.0–34.0)
MCHC: 34.8 g/dL (ref 30.0–36.0)
RBC: 4.45 MIL/uL (ref 4.22–5.81)

## 2011-12-20 LAB — ABO/RH: ABO/RH(D): O POS

## 2011-12-20 NOTE — Pre-Procedure Instructions (Signed)
20 Bradley Rice  12/20/2011   Your procedure is scheduled on:  Thursday, May 23  Report to [facility] at 9:30 AM.  Call this number if you have problems the morning of surgery: [phone]   Remember:   Do not eat food:After Midnight.  May have clear liquids: up to 4 Hours before arrival.(0530 am)  Clear liquids include soda, tea, black coffee, apple or grape juice, broth.  Take these medicines the morning of surgery with A SIP OF WATER: Percocet, if needed   Do not wear jewelry, make-up or nail polish.  Do not wear lotions, powders, or perfumes. You may wear deodorant.  Do not shave 48 hours prior to surgery. Men may shave face and neck.  Do not bring valuables to the hospital.  Contacts, dentures or bridgework may not be worn into surgery.  Leave suitcase in the car. After surgery it may be brought to your room.  For patients admitted to the hospital, checkout time is 11:00 AM the day of discharge.   Patients discharged the day of surgery will not be allowed to drive home.  Name and phone number of your driver: n/a  Special Instructions: Incentive Spirometry - Practice and bring it with you on the day of surgery. and CHG Shower Use Special Wash: 1/2 bottle night before surgery and 1/2 bottle morning of surgery.   Please read over the following fact sheets that you were given: Pain Booklet, Coughing and Deep Breathing, Blood Transfusion Information and Surgical Site Infection Prevention

## 2011-12-21 LAB — TYPE AND SCREEN
ABO/RH(D): O POS
Antibody Screen: NEGATIVE

## 2011-12-21 MED ORDER — CEFAZOLIN SODIUM-DEXTROSE 2-3 GM-% IV SOLR
2.0000 g | INTRAVENOUS | Status: AC
  Administered 2011-12-22: 2 g via INTRAVENOUS
  Filled 2011-12-21: qty 50

## 2011-12-21 MED ORDER — LACTATED RINGERS IV SOLN: INTRAVENOUS | Status: DC

## 2011-12-22 ENCOUNTER — Ambulatory Visit (HOSPITAL_COMMUNITY): Payer: BC Managed Care – PPO

## 2011-12-22 ENCOUNTER — Encounter (HOSPITAL_COMMUNITY): Payer: Self-pay | Admitting: Anesthesiology

## 2011-12-22 ENCOUNTER — Ambulatory Visit (HOSPITAL_COMMUNITY)
Admission: RE | Admit: 2011-12-22 | Discharge: 2011-12-23 | DRG: 865 | Disposition: A | Discharge status: Home / Self Care | Payer: BC Managed Care – PPO | Source: Ambulatory Visit | Attending: Orthopedic Surgery | Admitting: Orthopedic Surgery

## 2011-12-22 ENCOUNTER — Inpatient Hospital Stay (HOSPITAL_COMMUNITY): Payer: BC Managed Care – PPO

## 2011-12-22 ENCOUNTER — Encounter (HOSPITAL_COMMUNITY)
Admission: RE | Disposition: A | Discharge status: Home / Self Care | Payer: Self-pay | Source: Ambulatory Visit | Attending: Orthopedic Surgery

## 2011-12-22 ENCOUNTER — Ambulatory Visit (HOSPITAL_COMMUNITY): Payer: BC Managed Care – PPO | Admitting: Anesthesiology

## 2011-12-22 DIAGNOSIS — M47812 Spondylosis without myelopathy or radiculopathy, cervical region: Secondary | ICD-10-CM | POA: Insufficient documentation

## 2011-12-22 DIAGNOSIS — M5412 Radiculopathy, cervical region: Secondary | ICD-10-CM

## 2011-12-22 DIAGNOSIS — Z01812 Encounter for preprocedural laboratory examination: Secondary | ICD-10-CM | POA: Insufficient documentation

## 2011-12-22 DIAGNOSIS — Z01818 Encounter for other preprocedural examination: Secondary | ICD-10-CM | POA: Insufficient documentation

## 2011-12-22 DIAGNOSIS — F17200 Nicotine dependence, unspecified, uncomplicated: Secondary | ICD-10-CM | POA: Insufficient documentation

## 2011-12-22 HISTORY — PX: ANTERIOR CERVICAL DECOMP/DISCECTOMY FUSION: SHX1161

## 2011-12-22 SURGERY — ANTERIOR CERVICAL DECOMPRESSION/DISCECTOMY FUSION 3 LEVELS
Anesthesia: General | Site: Neck | Wound class: Clean

## 2011-12-22 MED ORDER — ACETAMINOPHEN 10 MG/ML IV SOLN
1000.0000 mg | Freq: Four times a day (QID) | INTRAVENOUS | Status: DC
  Administered 2011-12-22 – 2011-12-23 (×3): 1000 mg via INTRAVENOUS
  Filled 2011-12-22 (×4): qty 100

## 2011-12-22 MED ORDER — DEXAMETHASONE 4 MG PO TABS
4.0000 mg | ORAL_TABLET | Freq: Four times a day (QID) | ORAL | Status: DC
  Administered 2011-12-23: 4 mg via ORAL
  Filled 2011-12-22 (×7): qty 1

## 2011-12-22 MED ORDER — LIDOCAINE HCL (CARDIAC) 20 MG/ML IV SOLN
INTRAVENOUS | Status: DC | PRN
  Administered 2011-12-22: 80 mg via INTRAVENOUS
  Administered 2011-12-22: 50 mg via INTRAVENOUS

## 2011-12-22 MED ORDER — MENTHOL 3 MG MT LOZG
1.0000 | LOZENGE | OROMUCOSAL | Status: DC | PRN
  Filled 2011-12-22: qty 9

## 2011-12-22 MED ORDER — MIDAZOLAM HCL 5 MG/5ML IJ SOLN
INTRAMUSCULAR | Status: DC | PRN
  Administered 2011-12-22: 2 mg via INTRAVENOUS

## 2011-12-22 MED ORDER — ROCURONIUM BROMIDE 100 MG/10ML IV SOLN: INTRAVENOUS | Status: DC | PRN

## 2011-12-22 MED ORDER — GLYCOPYRROLATE 0.2 MG/ML IJ SOLN
INTRAMUSCULAR | Status: DC | PRN
  Administered 2011-12-22: .8 mg via INTRAVENOUS

## 2011-12-22 MED ORDER — ONDANSETRON HCL 4 MG/2ML IJ SOLN
INTRAMUSCULAR | Status: DC | PRN
  Administered 2011-12-22: 4 mg via INTRAVENOUS

## 2011-12-22 MED ORDER — NEOSTIGMINE METHYLSULFATE 1 MG/ML IJ SOLN
INTRAMUSCULAR | Status: DC | PRN
  Administered 2011-12-22: 5 mg via INTRAVENOUS

## 2011-12-22 MED ORDER — MORPHINE SULFATE 4 MG/ML IJ SOLN: 0.0500 mg/kg | INTRAMUSCULAR | Status: DC | PRN

## 2011-12-22 MED ORDER — PHENOL 1.4 % MT LIQD: 1.0000 | OROMUCOSAL | Status: DC | PRN

## 2011-12-22 MED ORDER — FENTANYL CITRATE 0.05 MG/ML IJ SOLN
INTRAMUSCULAR | Status: DC | PRN
  Administered 2011-12-22: 50 ug via INTRAVENOUS
  Administered 2011-12-22: 250 ug via INTRAVENOUS
  Administered 2011-12-22: 50 ug via INTRAVENOUS

## 2011-12-22 MED ORDER — VECURONIUM BROMIDE 10 MG IV SOLR
INTRAVENOUS | Status: DC | PRN
  Administered 2011-12-22: 8 mg via INTRAVENOUS
  Administered 2011-12-22: 9 mg via INTRAVENOUS
  Administered 2011-12-22 (×2): 2 mg via INTRAVENOUS

## 2011-12-22 MED ORDER — OXYCODONE HCL 5 MG PO TABS
10.0000 mg | ORAL_TABLET | ORAL | Status: DC | PRN
  Administered 2011-12-22 – 2011-12-23 (×3): 10 mg via ORAL
  Filled 2011-12-22 (×4): qty 2

## 2011-12-22 MED ORDER — ACETAMINOPHEN 10 MG/ML IV SOLN
1000.0000 mg | Freq: Once | INTRAVENOUS | Status: AC
  Filled 2011-12-22: qty 100

## 2011-12-22 MED ORDER — EPHEDRINE SULFATE 50 MG/ML IJ SOLN
INTRAMUSCULAR | Status: DC | PRN
  Administered 2011-12-22 (×3): 10 mg via INTRAVENOUS

## 2011-12-22 MED ORDER — METHOCARBAMOL 500 MG PO TABS
500.0000 mg | ORAL_TABLET | Freq: Four times a day (QID) | ORAL | Status: DC | PRN
  Administered 2011-12-23 (×2): 500 mg via ORAL
  Filled 2011-12-22 (×3): qty 1

## 2011-12-22 MED ORDER — SODIUM CHLORIDE 0.9 % IJ SOLN: 3.0000 mL | INTRAMUSCULAR | Status: DC | PRN

## 2011-12-22 MED ORDER — LACTATED RINGERS IV SOLN
INTRAVENOUS | Status: DC | PRN
  Administered 2011-12-22 (×3): via INTRAVENOUS

## 2011-12-22 MED ORDER — ZOLPIDEM TARTRATE 10 MG PO TABS
10.0000 mg | ORAL_TABLET | Freq: Every evening | ORAL | Status: DC | PRN
  Administered 2011-12-23: 10 mg via ORAL
  Filled 2011-12-22: qty 1

## 2011-12-22 MED ORDER — SODIUM CHLORIDE 0.9 % IV SOLN: 250.0000 mL | INTRAVENOUS | Status: DC

## 2011-12-22 MED ORDER — ACETAMINOPHEN 10 MG/ML IV SOLN
INTRAVENOUS | Status: DC | PRN
  Administered 2011-12-22: 1000 mg via INTRAVENOUS

## 2011-12-22 MED ORDER — 0.9 % SODIUM CHLORIDE (POUR BTL) OPTIME
TOPICAL | Status: DC | PRN
  Administered 2011-12-22: 1000 mL

## 2011-12-22 MED ORDER — ALPRAZOLAM 0.5 MG PO TABS
0.5000 mg | ORAL_TABLET | Freq: Three times a day (TID) | ORAL | Status: DC
  Administered 2011-12-22 – 2011-12-23 (×3): 0.5 mg via ORAL
  Filled 2011-12-22 (×3): qty 1

## 2011-12-22 MED ORDER — HYDROMORPHONE HCL PF 1 MG/ML IJ SOLN
0.2500 mg | INTRAMUSCULAR | Status: DC | PRN
  Administered 2011-12-22 (×4): 0.5 mg via INTRAVENOUS

## 2011-12-22 MED ORDER — LACTATED RINGERS IV SOLN: INTRAVENOUS | Status: DC

## 2011-12-22 MED ORDER — SODIUM CHLORIDE 0.9 % IV SOLN
10.0000 mg | INTRAVENOUS | Status: DC | PRN
  Administered 2011-12-22: 50 ug/min via INTRAVENOUS

## 2011-12-22 MED ORDER — ONDANSETRON HCL 4 MG/2ML IJ SOLN: 4.0000 mg | INTRAMUSCULAR | Status: DC | PRN

## 2011-12-22 MED ORDER — METHOCARBAMOL 100 MG/ML IJ SOLN
500.0000 mg | Freq: Four times a day (QID) | INTRAVENOUS | Status: DC | PRN
  Administered 2011-12-22: 500 mg via INTRAVENOUS
  Filled 2011-12-22 (×2): qty 5

## 2011-12-22 MED ORDER — DEXAMETHASONE SODIUM PHOSPHATE 4 MG/ML IJ SOLN
4.0000 mg | Freq: Four times a day (QID) | INTRAMUSCULAR | Status: DC
  Administered 2011-12-22 (×2): 4 mg via INTRAVENOUS
  Filled 2011-12-22 (×7): qty 1

## 2011-12-22 MED ORDER — SODIUM CHLORIDE 0.9 % IJ SOLN: 3.0000 mL | Freq: Two times a day (BID) | INTRAMUSCULAR | Status: DC

## 2011-12-22 MED ORDER — PROPOFOL 10 MG/ML IV EMUL
INTRAVENOUS | Status: DC | PRN
  Administered 2011-12-22: 200 mg via INTRAVENOUS
  Administered 2011-12-22: 100 mg via INTRAVENOUS

## 2011-12-22 MED ORDER — THROMBIN 20000 UNITS EX KIT
PACK | CUTANEOUS | Status: DC | PRN
  Administered 2011-12-22: 12:00:00 via TOPICAL

## 2011-12-22 MED ORDER — MORPHINE SULFATE 2 MG/ML IJ SOLN
1.0000 mg | INTRAMUSCULAR | Status: DC | PRN
  Administered 2011-12-22: 2 mg via INTRAVENOUS
  Administered 2011-12-22 – 2011-12-23 (×2): 4 mg via INTRAVENOUS
  Filled 2011-12-22 (×2): qty 2
  Filled 2011-12-22: qty 1

## 2011-12-22 MED ORDER — DEXAMETHASONE SODIUM PHOSPHATE 10 MG/ML IJ SOLN
10.0000 mg | Freq: Once | INTRAMUSCULAR | Status: DC
  Filled 2011-12-22: qty 1

## 2011-12-22 MED ORDER — CEFAZOLIN SODIUM 1-5 GM-% IV SOLN
1.0000 g | Freq: Three times a day (TID) | INTRAVENOUS | Status: AC
  Administered 2011-12-22 (×2): 1 g via INTRAVENOUS
  Filled 2011-12-22 (×2): qty 50

## 2011-12-22 MED ORDER — BUPIVACAINE HCL (PF) 0.25 % IJ SOLN
INTRAMUSCULAR | Status: DC | PRN
  Administered 2011-12-22: 4 mL

## 2011-12-22 SURGICAL SUPPLY — 72 items
ADH SKN CLS APL DERMABOND .7 (GAUZE/BANDAGES/DRESSINGS) ×1
BLADE SURG ROTATE 9660 (MISCELLANEOUS) IMPLANT
BUR EGG ELITE 4.0 (BURR) IMPLANT
BUR MATCHSTICK NEURO 3.0 LAGG (BURR) IMPLANT
CANISTER SUCTION 2500CC (MISCELLANEOUS) ×2 IMPLANT
CLOTH BEACON ORANGE TIMEOUT ST (SAFETY) ×2 IMPLANT
CLSR STERI-STRIP ANTIMIC 1/2X4 (GAUZE/BANDAGES/DRESSINGS) ×1 IMPLANT
COLLAR CERV LO CONTOUR FIRM DE (SOFTGOODS) IMPLANT
CORDS BIPOLAR (ELECTRODE) ×2 IMPLANT
COVER SURGICAL LIGHT HANDLE (MISCELLANEOUS) ×4 IMPLANT
CRADLE DONUT ADULT HEAD (MISCELLANEOUS) ×2 IMPLANT
DERMABOND ADVANCED (GAUZE/BANDAGES/DRESSINGS) ×1
DERMABOND ADVANCED .7 DNX12 (GAUZE/BANDAGES/DRESSINGS) ×1 IMPLANT
DEVICE ENDSKLTN MED 6 7MM (Orthopedic Implant) IMPLANT
DEVICE ENDSKLTN TC MED 8MM (Orthopedic Implant) IMPLANT
DRAPE C-ARM 42X72 X-RAY (DRAPES) ×2 IMPLANT
DRAPE POUCH INSTRU U-SHP 10X18 (DRAPES) ×2 IMPLANT
DRAPE SURG 17X23 STRL (DRAPES) ×2 IMPLANT
DRAPE U-SHAPE 47X51 STRL (DRAPES) ×2 IMPLANT
DRILL BIT ×1 IMPLANT
DRSG MEPILEX BORDER 4X4 (GAUZE/BANDAGES/DRESSINGS) ×2 IMPLANT
DURAPREP 26ML APPLICATOR (WOUND CARE) ×2 IMPLANT
ELECT COATED BLADE 2.86 ST (ELECTRODE) ×2 IMPLANT
ELECT REM PT RETURN 9FT ADLT (ELECTROSURGICAL) ×2
ELECTRODE REM PT RTRN 9FT ADLT (ELECTROSURGICAL) ×1 IMPLANT
ENDOSKELETON MED 6 7MM (Orthopedic Implant) ×4 IMPLANT
ENDOSKELTON TC IMPLANT 8MM MED (Orthopedic Implant) ×2 IMPLANT
GLOVE BIOGEL PI IND STRL 6.5 (GLOVE) ×1 IMPLANT
GLOVE BIOGEL PI IND STRL 8.5 (GLOVE) ×1 IMPLANT
GLOVE BIOGEL PI INDICATOR 6.5 (GLOVE) ×1
GLOVE BIOGEL PI INDICATOR 8.5 (GLOVE) ×1
GLOVE ECLIPSE 6.0 STRL STRAW (GLOVE) ×2 IMPLANT
GLOVE ECLIPSE 8.5 STRL (GLOVE) ×2 IMPLANT
GOWN PREVENTION PLUS XXLARGE (GOWN DISPOSABLE) ×1 IMPLANT
GOWN STRL NON-REIN LRG LVL3 (GOWN DISPOSABLE) ×2 IMPLANT
KIT BASIN OR (CUSTOM PROCEDURE TRAY) ×2 IMPLANT
KIT ROOM TURNOVER OR (KITS) ×2 IMPLANT
MIX DBX 10CC 35% BONE (Bone Implant) ×1 IMPLANT
NDL SPNL 18GX3.5 QUINCKE PK (NEEDLE) ×1 IMPLANT
NEEDLE SPNL 18GX3.5 QUINCKE PK (NEEDLE) ×2 IMPLANT
NS IRRIG 1000ML POUR BTL (IV SOLUTION) ×2 IMPLANT
PACK ORTHO CERVICAL (CUSTOM PROCEDURE TRAY) ×2 IMPLANT
PACK UNIVERSAL I (CUSTOM PROCEDURE TRAY) ×2 IMPLANT
PAD ARMBOARD 7.5X6 YLW CONV (MISCELLANEOUS) ×4 IMPLANT
PATTIES SURGICAL .5 X.5 (GAUZE/BANDAGES/DRESSINGS) IMPLANT
PIN FIXATION TEMP (PIN) ×1 IMPLANT
PIN RETAINER PRODISC 14 MM (PIN) ×2 IMPLANT
RETAINER SCREW ×2 IMPLANT
SCREW 4.0X14MM (Screw) ×4 IMPLANT
SCREW BN 14X4XSLF DRL VA SLF (Screw) IMPLANT
SCREW SELF DRILL 14MM (Screw) ×4 IMPLANT
SCREW SELF DRILL 4.0X16MM (Screw) ×2 IMPLANT
SPONGE INTESTINAL PEANUT (DISPOSABLE) ×2 IMPLANT
SPONGE LAP 4X18 X RAY DECT (DISPOSABLE) IMPLANT
SPONGE SURGIFOAM ABS GEL 100 (HEMOSTASIS) ×2 IMPLANT
STRIP CLOSURE SKIN 1/2X4 (GAUZE/BANDAGES/DRESSINGS) ×2 IMPLANT
SURGIFLO TRUKIT (HEMOSTASIS) IMPLANT
SUT MNCRL AB 3-0 PS2 18 (SUTURE) ×2 IMPLANT
SUT SILK 2 0 (SUTURE) ×2
SUT SILK 2-0 18XBRD TIE 12 (SUTURE) ×1 IMPLANT
SUT VIC AB 2-0 CT1 18 (SUTURE) ×2 IMPLANT
SUT VIC AB 3-0 54X BRD REEL (SUTURE) IMPLANT
SUT VIC AB 3-0 BRD 54 (SUTURE)
SYR BULB IRRIGATION 50ML (SYRINGE) ×2 IMPLANT
SYR CONTROL 10ML LL (SYRINGE) ×2 IMPLANT
TAPE CLOTH 4X10 WHT NS (GAUZE/BANDAGES/DRESSINGS) ×2 IMPLANT
TAPE UMBILICAL COTTON 1/8X30 (MISCELLANEOUS) ×2 IMPLANT
TOWEL OR 17X24 6PK STRL BLUE (TOWEL DISPOSABLE) ×1 IMPLANT
TOWEL OR 17X26 10 PK STRL BLUE (TOWEL DISPOSABLE) ×1 IMPLANT
TRAY FOLEY CATH 14FR (SET/KITS/TRAYS/PACK) IMPLANT
Vectra-T Plate 54mm ×1 IMPLANT
WATER STERILE IRR 1000ML POUR (IV SOLUTION) ×2 IMPLANT

## 2011-12-22 NOTE — Op Note (Signed)
NAMEADEL, BURCH NO.:  [id number]  MEDICAL RECORD NO.:  [id number]  LOCATION:  5018                         FACILITY:  [facility]  PHYSICIAN:  Alvy Beal, MD    DATE OF BIRTH:  July 22, 1961  DATE OF PROCEDURE:  12/22/2011 DATE OF DISCHARGE:                              OPERATIVE REPORT   PREOPERATIVE DIAGNOSIS:  Cervical spondylotic radiculopathy.  POSTOPERATIVE DIAGNOSIS:  Cervical spondylotic radiculopathy.  OPERATIVE PROCEDURE:  Anterior cervical diskectomy and fusion C5-6, C6- 7, C7-T1.  FIRST ASSISTANT:  Norval Gable, PA.  INSTRUMENTATION SYSTEM USED:  Titan Titanium intervertebral spacer at C5- 6, was a 7 mm lordotic medium spacer and C6-7, was an 8 mm medium lordotic, and at C7-T1, was a 7 mm medium lordotic.  All cages were packed with DBX mix.  Anterior cervical Vectra translational plate (Vectra-T) was used, was locked superiorly with 16 mm fixed-angle screws, inferiorly with 14 mm angle screws and then 14 mm screws in the intervening segment.  COMPLICATIONS:  None.  CONDITION:  Stable.  HISTORY:  This is a very pleasant gentleman who has been having severe debilitating neck, shoulder, scapular, and arm pain.  Despite conservative management, his symptoms persisted.  Because of the ongoing severe pain, he elected to proceed with surgery.  All appropriate risks, benefits, and alternatives were discussed with the patient.  Consent was obtained.  OPERATIVE NOTE:  The patient was brought to the operating room and placed supine on the operating table.  After successful induction of general anesthesia and endotracheal intubation, TEDs, SCDs, and Foley were inserted.  Rolled towels were placed between the shoulder blades. The neck was properly positioned for an anterior cervical diskectomy and fusion.  The patient's neck was then prepped and draped in standard fashion.  Time-out was done to confirm patient, procedure, and all other  pertinent important data.  Once this was completed, we proceeded with surgery.  A longitudinal incision was made on the left side of the neck in line with the sternocleidomastoid.  Incision started at the superior aspect of the midbody of C5 and extended into the midbody of the T1.  A sharp dissection was carried out down to and through the platysma.  Once the platysmas was divided, I then sharply dissected through the deep cervical and prevertebral fascia, sweeping the carotid sheath laterally and protecting with a finger.  The esophagus and trachea were swept to the right and protected with a retractor.  At this point, once I was through the deep cervical and prevertebral fascia, I could visualize the anterior cervical spine.  A needle was placed into the C5-6 disk space, an x-ray was taken and confirmed that I was at the appropriate level.  Using bipolar electrocautery, I then mobilized the longus colli muscles from the midbody of C5 to the midbody of the T1.  Once I had mobilized this, I then resected the anterior osteophytes from the 5-6, 6-7, 7-1 disk space with a Leksell rongeur.  Once this was done, a Caspar retracting blade was placed underneath the longus coli muscle and expanded.  The endotracheal cuff was deflated and reinflated after the retractor was placed.  Distraction pins were placed at the 6-7 vertebral body and the disk space was distracted.  An annulotomy was performed with a 15 blade scalpel, then using a combination of pituitary rongeurs, curettes, and Kerrison rongeurs, I removed all the disk material.  I then released the anulus posteriorly with a small nerve hook and I then used a 1 mm Kerrison to resect the osteophytes from the uncovertebral joint and from the posterior aspect of the vertebral bodies.  Once I had the osteophytes trimmed down from C7 to T1, I rasped the endplates so I had bleeding subchondral bone.  Once this was accomplished, I then trialed  intervertebral spacer and used 7 medium lordotic spacer.  I obtained the implant and packed it with DBX mix and malleted it to the appropriate depth.  Once this level was done, I removed the distraction pins and I then placed them into the T1 vertebral body and moved into the C6 vertebral body.  I now distracted the C6-7 disk space and using the same technique that I used at C7-T1, I performed a complete diskectomy.  Again, care was taken to resect the posterior osteophytes with a 1 mm Kerrison and to ensure that I had an adequate decompression and diskectomy.  I rasped the endplates again and then at this time, I placed an 8 mm medium lordotic spacer as this provided the best fit.  Again it was packed with DBX mix and malleted to the appropriate depth.  I repositioned the retractors to the C5-6 disk space and again performed the diskectomy in a similar fashion.  Once I had the complete disk out and I released the osteophytes and the posterior anulus, I reamed the endplates and placed a 7-mm lordotic medium spacer packed with DBX mixed.  The wound was then copiously irrigated.  I then contoured a 54- mm anterior cervical Vectra-T plate and secured it with self-drilling fixed-angle screws.  16 mm screws were used to the body of C5.  14 mm screws were used to the body of T1.  Once the superior and inferior ends of the plate were fixed, I then took an x-ray to confirm I had satisfactory position.  Once this was confirmed, I then placed my intervening segments.  Once all 8 screws were properly placed, they were all tightened down appropriately so that they were locked to the plate. I then checked to ensure the esophagus and trachea were not inadvertently entrapped beneath the plate and they were not.  Once I was sure that all soft tissue was freed from underneath the plate, I removed the translational blocks on the plate and then irrigated copiously with normal saline.  I then returned the  trachea and then esophagus back to midline.  I closed the platysma with interrupted 2-0 Vicryl suture and a 3-0 Monocryl for the skin.  Steri-Strips and dry dressing were applied. The patient was extubated, transferred to PACU without incident.  At the end of the case, all needle and sponge counts were correct.  There was no adverse intraoperative events.     Alvy Beal, MD     DDB/MEDQ  D:  12/22/2011  T:  12/22/2011  Job:  [id number]

## 2011-12-22 NOTE — Transfer of Care (Signed)
Immediate Anesthesia Transfer of Care Note  Patient: Bradley Rice  Procedure(s) Performed: Procedure(s) (LRB): ANTERIOR CERVICAL DECOMPRESSION/DISCECTOMY FUSION 3 LEVELS (N/A)  Patient Location: PACU  Anesthesia Type: General  Level of Consciousness: sedated  Airway & Oxygen Therapy: Patient Spontanous Breathing and Patient connected to face mask oxygen  Post-op Assessment: Report given to PACU RN, Post -op Vital signs reviewed and stable and Patient moving all extremities X 4  Post vital signs: Reviewed and stable  Complications: No apparent anesthesia complications

## 2011-12-22 NOTE — Brief Op Note (Signed)
12/22/2011  2:49 PM  PATIENT:  [name]  51 y.o. male  PRE-OPERATIVE DIAGNOSIS:  CERVICLE SPONDYLOTIC RADICULOPATHY  POST-OPERATIVE DIAGNOSIS:  CERVICLE SPONDYLOTIC RADICULOPATH  PROCEDURE:  Procedure(s) (LRB): ANTERIOR CERVICAL DECOMPRESSION/DISCECTOMY FUSION 3 LEVELS (N/A)  SURGEON:  Surgeon(s) and Role:    * Venita Lick, MD - Primary  PHYSICIAN ASSISTANT:   ASSISTANTS: Norval Gable   ANESTHESIA:   none  EBL:  Total I/O In: 2600 [I.V.:2600] Out: 600 [Urine:500; Blood:100]  BLOOD ADMINISTERED:none  DRAINS: none   LOCAL MEDICATIONS USED:  MARCAINE     SPECIMEN:  No Specimen  DISPOSITION OF SPECIMEN:  N/A  COUNTS:  YES  TOURNIQUET:  * No tourniquets in log *  DICTATION: .Other Dictation: Dictation Number [id number]  PLAN OF CARE: Admit to inpatient   PATIENT DISPOSITION:  PACU - hemodynamically stable.

## 2011-12-22 NOTE — Preoperative (Signed)
Beta Blockers   Reason not to administer Beta Blockers:Not Applicable 

## 2011-12-22 NOTE — Anesthesia Procedure Notes (Signed)
Procedure Name: Intubation Date/Time: 12/22/2011 10:54 AM Performed by: Carmela Rima Pre-anesthesia Checklist: Emergency Drugs available, Patient identified, Timeout performed, Suction available and Patient being monitored Patient Re-evaluated:Patient Re-evaluated prior to inductionOxygen Delivery Method: Circle system utilized Preoxygenation: Pre-oxygenation with 100% oxygen Intubation Type: IV induction Ventilation: Mask ventilation without difficulty Grade View: Grade I Tube size: 7.5 mm Number of attempts: 1 Airway Equipment and Method: Video-laryngoscopy Placement Confirmation: ETT inserted through vocal cords under direct vision,  breath sounds checked- equal and bilateral and positive ETCO2 Secured at: 23 cm Tube secured with: Tape Dental Injury: Teeth and Oropharynx as per pre-operative assessment

## 2011-12-22 NOTE — H&P (Signed)
No change in clinical exam H+P reviewed  

## 2011-12-22 NOTE — Anesthesia Preprocedure Evaluation (Addendum)
Anesthesia Evaluation  Patient identified by MRN, date of birth, ID band Patient awake    Reviewed: Allergy & Precautions, H&P , NPO status , Patient's Chart, lab work & pertinent test results  Airway Mallampati: II      Dental  (+) Dental Advidsory Given   Pulmonary Current Smoker,  breath sounds clear to auscultation        Cardiovascular negative cardio ROS  Rhythm:Regular Rate:Normal     Neuro/Psych    GI/Hepatic negative GI ROS, Neg liver ROS,   Endo/Other  negative endocrine ROS  Renal/GU negative Renal ROS     Musculoskeletal   Abdominal   Peds  Hematology negative hematology ROS (+)   Anesthesia Other Findings   Reproductive/Obstetrics                          Anesthesia Physical Anesthesia Plan  ASA: II  Anesthesia Plan: General   Post-op Pain Management:    Induction: Intravenous  Airway Management Planned: Oral ETT  Additional Equipment:   Intra-op Plan:   Post-operative Plan: Extubation in OR  Informed Consent:   Dental Advisory Given and History available from chart only  Plan Discussed with: CRNA and Anesthesiologist  Anesthesia Plan Comments:        Anesthesia Quick Evaluation

## 2011-12-22 NOTE — Anesthesia Postprocedure Evaluation (Signed)
  Anesthesia Post-op Note  Patient: Bradley Rice  Procedure(s) Performed: Procedure(s) (LRB): ANTERIOR CERVICAL DECOMPRESSION/DISCECTOMY FUSION 3 LEVELS (N/A)  Patient Location: PACU  Anesthesia Type: General  Level of Consciousness: awake  Airway and Oxygen Therapy: Patient Spontanous Breathing  Post-op Pain: mild  Post-op Assessment: Post-op Vital signs reviewed  Post-op Vital Signs: Reviewed  Complications: No apparent anesthesia complications

## 2011-12-23 MED ORDER — POLYETHYLENE GLYCOL 3350 17 G PO PACK: 17.0000 g | PACK | Freq: Every day | ORAL | Status: AC

## 2011-12-23 MED ORDER — METHOCARBAMOL 500 MG PO TABS: 500.0000 mg | ORAL_TABLET | Freq: Three times a day (TID) | ORAL | Status: AC

## 2011-12-23 MED ORDER — OXYCODONE-ACETAMINOPHEN 10-325 MG PO TABS: 1.0000 | ORAL_TABLET | Freq: Four times a day (QID) | ORAL | Status: AC | PRN

## 2011-12-23 MED ORDER — ONDANSETRON HCL 4 MG PO TABS: 4.0000 mg | ORAL_TABLET | Freq: Three times a day (TID) | ORAL | Status: AC | PRN

## 2011-12-23 NOTE — Care Management Note (Signed)
    Page 1 of 2   12/23/2011     10:10:21 AM   CARE MANAGEMENT NOTE 12/23/2011  Patient:  Bradley Rice, Bradley Rice   Account Number:  [id number]  Date Initiated:  12/23/2011  Documentation initiated by:  Anette Guarneri  Subjective/Objective Assessment:   POD#1 s/p ACDF,  OT recommends HHOT     Action/Plan:   obtained order from physician for HHOT/PT  contact HH agency   Anticipated DC Date:  12/23/2011   Anticipated DC Plan:  HOME W HOME HEALTH SERVICES      DC Planning Services  CM consult      [facility] Choice  HOME HEALTH   Choice offered to / List presented to:  C-1 Patient        HH arranged  HH-2 PT  HH-3 OT      [facility] agency  [facility]   Status of service:  Completed, signed off Medicare Important Message given?  NO (If response is "NO", the following Medicare IM given date fields will be blank) Date Medicare IM given:   Date Additional Medicare IM given:    Discharge Disposition:  HOME W HOME HEALTH SERVICES  Per UR Regulation:  Reviewed for med. necessity/level of care/duration of stay  If discussed at Long Length of Stay Meetings, dates discussed:    Comments:  12/23/11  10:08 Anette Guarneri RN/CM spoke with patient regarding [facility] services, patient choice is [place] contacted [place] via [facility], arranged for HHPT/OT

## 2011-12-23 NOTE — Evaluation (Signed)
Physical Therapy Evaluation Patient Details Name: Bradley Rice MRN: [id number] DOB: 04-20-1961 Today's Date: 12/23/2011 Time: 7829-5621 PT Time Calculation (min): 30 min  PT Assessment / Plan / Recommendation Clinical Impression  pt presents s/p C5-T1 Fusion.  pt notes history of back pain and plan for Lumbar surgery in near future.  pt unsteady with mobility secondary to pain in back.  Strongly encouraged pt to use a RW instead of a cane for safety and stability.  pt notes he got a wide RW from [facility] ~31months ago and was given the wide one because he was told it was the only one they had.  pt needs a standard width RW.  RN made aware of situation with RW as CM not on unit yet.      PT Assessment  All further PT needs can be met in the next venue of care    Follow Up Recommendations  Home health PT;Supervision/Assistance - 24 hour    Barriers to Discharge None      lEquipment Recommendations  Rolling walker with 5" wheels    Recommendations for Other Services     Frequency      Precautions / Restrictions Precautions Precautions: Cervical Required Braces or Orthoses: Cervical Brace Cervical Brace: Hard collar Restrictions Weight Bearing Restrictions: Yes   Pertinent Vitals/Pain Terrible back worse than neck.        Mobility  Bed Mobility Bed Mobility: Rolling Left;Left Sidelying to Sit;Sitting - Scoot to Edge of Bed Rolling Left: 5: Supervision;With rail Left Sidelying to Sit: 5: Supervision;With rails Sitting - Scoot to Edge of Bed: 5: Supervision;With rail Details for Bed Mobility Assistance: Min verbal cues for hand placement and technique for log rolling Transfers Transfers: Sit to Stand;Stand to Sit Sit to Stand: 4: Min assist;From bed;With upper extremity assist Stand to Sit: 4: Min assist;To bed;With upper extremity assist Details for Transfer Assistance: Mod verbal cues for hand placement and technique  Ambulation/Gait Ambulation/Gait Assistance: 4: Min  guard;4: Min assist Ambulation Distance (Feet): 180 Feet Assistive device: Straight cane;Rolling walker Ambulation/Gait Assistance Details: Initially amb with RW, however pt wanting to use cane secondary to feeling it decreased his back pain.  pt unsteady with cane and with flexed trunk, R lateral lean, and narrow BOS.   Gait Pattern: Step-through pattern;Decreased stride length;Narrow base of support;Trunk flexed Stairs: Yes Stairs Assistance: 4: Min assist Stairs Assistance Details (indicate cue type and reason): cues for stair gait with cane and no rails.  pt generally unsteady and encouraged pt to have son present to A with stairs.   Stair Management Technique: No rails;Forwards;With cane Number of Stairs: 3  Wheelchair Mobility Wheelchair Mobility: No    Exercises     PT Diagnosis: Difficulty walking;Acute pain  PT Problem List: Decreased strength;Decreased activity tolerance;Decreased balance;Decreased mobility;Decreased knowledge of use of DME;Decreased knowledge of precautions;Pain PT Treatment Interventions:     PT Goals    Visit Information  Last PT Received On: 12/23/11 Assistance Needed: +1 PT/OT Co-Evaluation/Treatment: Yes    Subjective Data  Subjective: I've been up, it just hurts.   Patient Stated Goal: Back to normal   Prior Functioning  Home Living Lives With: Spouse;Son Available Help at Discharge: Family;Available 24 hours/day Type of Home: House Home Access: Stairs to enter [employer] of Steps: 4 Entrance Stairs-Rails: None Home Layout: One level Bathroom Shower/Tub: Engineer, manufacturing systems: Standard Bathroom Accessibility: No Home Adaptive Equipment: Hand-held shower hose Additional Comments: Pt. can borrow shower chair from mother-in-law Prior  Function Level of Independence: Independent Able to Take Stairs?: Yes Driving: Yes Vocation: Full time employment Communication Communication: No difficulties Dominant Hand: Left      Cognition  Overall Cognitive Status: Appears within functional limits for tasks assessed/performed Arousal/Alertness: Awake/alert Orientation Level: Oriented X4 / Intact Behavior During Session: [facility] for tasks performed    Extremity/Trunk Assessment Right Upper Extremity Assessment RUE ROM/Strength/Tone: Within functional levels RUE Sensation: WFL - Light Touch RUE Coordination: WFL - gross/fine motor Left Upper Extremity Assessment LUE ROM/Strength/Tone: Within functional levels LUE Sensation: Deficits (decreased light touch) LUE Coordination: WFL - gross/fine motor Right Lower Extremity Assessment RLE ROM/Strength/Tone: Deficits;Due to pain RLE ROM/Strength/Tone Deficits: AROM WFL, Strength limited by back pain, grossly 4-/5 RLE Sensation: WFL - Light Touch Left Lower Extremity Assessment LLE ROM/Strength/Tone: WFL for tasks assessed LLE Sensation: WFL - Light Touch   Balance Balance Balance Assessed: No  End of Session PT - End of Session Equipment Utilized During Treatment: Gait belt;Cervical collar Activity Tolerance: Patient tolerated treatment well Patient left: in chair;with call bell/phone within reach;with family/visitor present Nurse Communication: Mobility status (DME for home)   Bradley Rice, [place] [phone] 12/23/2011, 10:12 AM

## 2011-12-23 NOTE — Evaluation (Signed)
Occupational Therapy Evaluation Patient Details Name: Bradley Rice MRN: [id number] DOB: 08-04-1960 Today's Date: 12/23/2011 Time: 6295-2841 OT Time Calculation (min): 39 min  OT Assessment / Plan / Recommendation Clinical Impression  Pt. presents s/p cervical fusion and with increased pain. Pt. with balance deficits and cervical precautions limiting functional independence with ADLs. Pt. will benefit from skilled OT to increase level to supervision with ADLs.    OT Assessment  Patient needs continued OT Services    Follow Up Recommendations  Home health OT;Supervision - Intermittent    Barriers to Discharge None    Equipment Recommendations  Rolling walker with 5" wheels       Frequency  Min 2X/week    Precautions / Restrictions Precautions Precautions: Cervical Required Braces or Orthoses: Cervical Brace Cervical Brace: Hard collar Restrictions Weight Bearing Restrictions: No   Pertinent Vitals/Pain 9/10 neck    ADL  Eating/Feeding: Simulated;Set up Where Assessed - Eating/Feeding: Chair Grooming: Performed;Wash/dry hands;Wash/dry face;Set up;Min guard Where Assessed - Grooming: Unsupported standing Upper Body Bathing: Simulated;Set up Where Assessed - Upper Body Bathing: Unsupported sitting Lower Body Bathing: Simulated;Set up Where Assessed - Lower Body Bathing: Unsupported sitting Upper Body Dressing: Performed;Minimal assistance Where Assessed - Upper Body Dressing: Unsupported sitting Lower Body Dressing: Performed;Minimal assistance Where Assessed - Lower Body Dressing: Unsupported sitting Toilet Transfer: Performed;Minimal assistance Toilet Transfer Method: Sit to stand Toilet Transfer Equipment: Regular height toilet;Grab bars Toileting - Clothing Manipulation and Hygiene: Simulated;Set up Where Assessed - Toileting Clothing Manipulation and Hygiene: Sit on 3-in-1 or toilet Tub/Shower Transfer: Simulated;Minimal assistance Tub/Shower Transfer Method:  Ambulating Tub/Shower Transfer Equipment: Shower seat with back;Grab bars Equipment Used: Rolling walker;Cane Transfers/Ambulation Related to ADLs: Min assist ~50' with cane/RW ADL Comments: Pt. educated on techniques for completing LB ADLs by crossing foot over opposite knee to prevent bend. Pt. educated on all precautions for cervical precautions and donning of brace/doffing.     OT Diagnosis: Generalized weakness;Acute pain  OT Problem List: Decreased strength;Decreased activity tolerance;Impaired balance (sitting and/or standing);Decreased safety awareness;Decreased knowledge of use of DME or AE;Decreased knowledge of precautions;Pain OT Treatment Interventions: Self-care/ADL training;Energy conservation;DME and/or AE instruction;Therapeutic activities;Patient/family education;Balance training   OT Goals Acute Rehab OT Goals OT Goal Formulation: With patient Time For Goal Achievement: 12/30/11 Potential to Achieve Goals: Good ADL Goals Pt Will Transfer to Toilet: with supervision;Ambulation;with DME;Regular height toilet;Grab bars ADL Goal: Toilet Transfer - Progress: Goal set today Pt Will Perform Tub/Shower Transfer: Tub transfer;with supervision;Shower seat with back;with DME ADL Goal: Tub/Shower Transfer - Progress: Goal set today  Visit Information  Last OT Received On: 12/23/11 Assistance Needed: +1    Subjective Data  Subjective: "I am moving fine" Patient Stated Goal: "Go home"   Prior Functioning  Home Living Lives With: Spouse;Son Available Help at Discharge: Family;Available 24 hours/day Type of Home: House Home Access: Stairs to enter [employer] of Steps: 4 Entrance Stairs-Rails: None Home Layout: One level Bathroom Shower/Tub: Engineer, manufacturing systems: Standard Bathroom Accessibility: No Home Adaptive Equipment: Hand-held shower hose Additional Comments: Pt. can borrow shower chair from mother-in-law Prior Function Level of Independence:  Independent Able to Take Stairs?: Yes Driving: Yes Vocation: Full time employment Communication Communication: No difficulties Dominant Hand: Left    Cognition  Overall Cognitive Status: Appears within functional limits for tasks assessed/performed Arousal/Alertness: Awake/alert Orientation Level: Oriented X4 / Intact Behavior During Session: [facility] for tasks performed    Extremity/Trunk Assessment Right Upper Extremity Assessment RUE ROM/Strength/Tone: Within functional levels RUE Sensation: WFL -  Light Touch RUE Coordination: WFL - gross/fine motor Left Upper Extremity Assessment LUE ROM/Strength/Tone: Within functional levels LUE Sensation: Deficits (decreased light touch) LUE Coordination: WFL - gross/fine motor Right Lower Extremity Assessment RLE ROM/Strength/Tone: Deficits;Due to pain RLE ROM/Strength/Tone Deficits: AROM WFL, Strength limited by back pain, grossly 4-/5 RLE Sensation: WFL - Light Touch Left Lower Extremity Assessment LLE ROM/Strength/Tone: WFL for tasks assessed LLE Sensation: WFL - Light Touch   Mobility Bed Mobility Bed Mobility: Rolling Left;Left Sidelying to Sit;Sitting - Scoot to Edge of Bed Rolling Left: 5: Supervision;With rail Left Sidelying to Sit: 5: Supervision;With rails Sitting - Scoot to Edge of Bed: 5: Supervision;With rail Details for Bed Mobility Assistance: Min verbal cues for hand placement and technique for log rolling Transfers Transfers: Sit to Stand;Stand to Sit Sit to Stand: 4: Min assist;From bed;With upper extremity assist Stand to Sit: 4: Min assist;To bed;With upper extremity assist Details for Transfer Assistance: Mod verbal cues for hand placement and technique          End of Session OT - End of Session Equipment Utilized During Treatment: Gait belt;Cervical collar Activity Tolerance: Patient limited by pain Patient left: in chair;with call bell/phone within reach;with family/visitor present Nurse Communication: Mobility  status   Willem Klingensmith, OTR/L Pager [phone] 12/23/2011, 9:06 AM

## 2011-12-23 NOTE — Progress Notes (Signed)
Bradley Rice 51 y.o. 12/22/2011  CERVICLE SPONDYLOTIC RADICULOPATH  1 Day Post-Op   Subjective Patient complaints:doing well without problems  Objective Back:  no straight leg raising pain Neck:numbness of the arm(s) and no swelling.    Neuro exam: grossly normal Vascular: peripheral pulses symmetrical Abdomen: abdomen is soft without significant tenderness, masses, organomegaly or guarding Wound: dressing C/D/I and no drainage  Lab. Results: Basename   12/20/11             1523      WBC        15.4*     HGB        14.6      HCT        42.0      PLT        250       BMET Basename   12/20/11             1523      NA         137       K          3.7       CL         100       CO2        27        GLUCOSE    81        BUN        6         CREATININE 0.75      CALCIUM    9.2        No results found for this basename: inr VITALS ---------------------------              12/23/11                     0508        ---------------------------  BP:          106/63        Pulse:         90          Temp:   98.1 F (36.7 C)  Resp:          18         ---------------------------  Dg Cervical Spine 2-3 Views  12/22/2011  *RADIOLOGY REPORT*  Clinical Data: ACDF.  DG C-ARM GT 120 MIN,CERVICAL SPINE - 2-3 VIEW  Comparison: None.  Findings: Changes of three-level anterior fusion. It is difficult to determine the exact level.  On the frontal view, this appears to be from C5-T1.  IMPRESSION: Three-level anterior cervical fusion as above.  Original Report Authenticated By: Cyndie Chime, M.D.   Dg Cervical Spine 2-3 Views  12/22/2011  *RADIOLOGY REPORT*  Clinical Data: Postop cervical spine fusion.  CERVICAL SPINE - 2-3 VIEW  Comparison: 12/22/2011.  Findings: Intraoperative AP and lateral views of the cervical spine show C5-T1 anterior cervical fusion with interbody spacers.  No hardware complications.  Alignment is anatomic.  Mild endplate degenerative changes  are seen anteriorly at C4-5.  Minimal prevertebral soft tissue swelling.  IMPRESSION: C5-T1 anterior cervical fusion without complicating feature.  Original Report Authenticated By: Reyes Ivan, M.D.   Dg Cervical Spine 2-3 Views  12/20/2011  *RADIOLOGY REPORT*  Clinical Data: Preop  CERVICAL SPINE - 2-3 VIEW  Comparison: 04/21/2005  Findings: Two views of cervical spine submitted.  No acute fracture or  subluxation.  Again noted degenerative changes with disc space flattening and mild anterior spurring C4-C5 and C5-C6 C6-C7 and C 7T1 level.  Most significant disc space flattening at C6-C7 level. No prevertebral soft tissue swelling.  Cervical airway is patent.  IMPRESSION: No acute fracture or subluxation.  Degenerative changes as described above.  Original Report Authenticated By: Natasha Mead, M.D.   Dg C-arm Gt 120 Min  12/22/2011  *RADIOLOGY REPORT*  Clinical Data: ACDF.  DG C-ARM GT 120 MIN,CERVICAL SPINE - 2-3 VIEW  Comparison: None.  Findings: Changes of three-level anterior fusion. It is difficult to determine the exact level.  On the frontal view, this appears to be from C5-T1.  IMPRESSION: Three-level anterior cervical fusion as above.  Original Report Authenticated By: Cyndie Chime, M.D.     Assessment/ Plan Patient: Doing well postoperatively. Plan: Encourage ambulation & incentive spirometer Xrays: satisfactory Disposition:  D/c to home today Discharge condition: Good Discharge destination: Home Plan for follow-up: 1: in 2 weeks    2: instructions provided (pre-printed)     3: scripts in chart    Bradley Rice D 5/24/20137:55 AM

## 2011-12-23 NOTE — Discharge Summary (Signed)
Patient ID: Bradley Rice MRN: [id number] DOB/AGE: [birth year] 51 y.o.  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    Past Medical History  Diagnosis Date  . Back pain   . Spider bite 2012  . No pertinent past medical history     Surgeries: Procedure(s): ANTERIOR CERVICAL DECOMPRESSION/DISCECTOMY FUSION 3 LEVELS on 12/22/2011   Consultants:  none  Discharged Condition: Improved  Hospital Course: Bradley Rice is an 50 y.o. male who was admitted 12/22/2011 for operative treatment of cervical spondylotic radiculopathy . Patient failed conservative treatments (please see the history and physical for the specifics) and had severe unremitting pain that affects sleep, daily activities, and work/hobbies. After pre-op clearance the patient was taken to the operating room on 12/22/2011 and underwent  Procedure(s): ANTERIOR CERVICAL DECOMPRESSION/DISCECTOMY FUSION 3 LEVELS.    Patient was given perioperative antibiotics: Anti-infectives     Start     Dose/Rate Route Frequency Ordered Stop   12/22/11 1745   ceFAZolin (ANCEF) IVPB 1 g/50 mL premix        1 g 100 mL/hr over 30 Minutes Intravenous Every 8 hours 12/22/11 1732 12/22/11 1910   12/21/11 1306   ceFAZolin (ANCEF) IVPB 2 g/50 mL premix        2 g 100 mL/hr over 30 Minutes Intravenous 60 min pre-op 12/21/11 1306 12/22/11 1054           Patient was given sequential compression devices and early ambulation to prevent DVT.   Patient benefited maximally from hospital stay and there were no complications. At the time of discharge, the patient was moving their bowels without difficulty, tolerating a regular diet, pain is controlled with oral pain medications and they have been cleared by PT/OT.   Recent vital signs: Patient Vitals for the past 24 hrs:  BP Temp Temp src Pulse Resp SpO2  12/23/11 0508 106/63 mmHg 98.1 F (36.7 C) - 90  18  98 %  12/23/11 0132 112/72 mmHg 98.3 F (36.8 C) Oral 92  20  -  12/22/11 2124 116/71 mmHg 97.9 F (36.6 C) - 89  18  97 %  12/22/11 1630 109/61 mmHg 99.5 F (37.5 C) - 100  16  98 %  12/22/11 1615 125/67 mmHg 97.2 F (36.2 C) - 104  14  97 %  12/22/11 1600 117/62 mmHg - - 84  17  98 %  12/22/11 1545 122/69 mmHg - - 91  18  97 %  12/22/11 1535 - - - 99  20  98 %  12/22/11 1530 106/73 mmHg - - 99  21  97 %  12/22/11 1515 126/67 mmHg - - 108  11  97 %  12/22/11 1500 129/64 mmHg 97.8 F (36.6 C) - 110  22  96 %  12/22/11 0943 111/73 mmHg 98.3 F (36.8 C) Oral 89  18  97 %     Recent laboratory studies:  Basename 12/20/11 1523  WBC 15.4*  HGB 14.6  HCT 42.0  PLT 250  NA 137  K 3.7  CL 100  CO2 27  BUN 6  CREATININE 0.75  GLUCOSE 81  INR --  CALCIUM 9.2     Discharge Medications:   Medication List  As of 12/23/2011  7:50 AM   STOP taking these medications         oxyCODONE-acetaminophen 7.5-325 MG per  tablet         TAKE these medications         ALPRAZolam 0.5 MG tablet   Commonly known as: XANAX   Take 0.5 mg by mouth every 8 (eight) hours.      methocarbamol 500 MG tablet   Commonly known as: ROBAXIN   Take 1 tablet (500 mg total) by mouth 3 (three) times daily. MAX 3 pills daily      ondansetron 4 MG tablet   Commonly known as: ZOFRAN   Take 1 tablet (4 mg total) by mouth every 8 (eight) hours as needed for nausea. MAX 3 pills daily      oxyCODONE-acetaminophen 10-325 MG per tablet   Commonly known as: PERCOCET   Take 1 tablet by mouth every 6 (six) hours as needed for pain. MAX 4 pills daily      polyethylene glycol packet   Commonly known as: MIRALAX / GLYCOLAX   Take 17 g by mouth daily. Take 1 packet daily until bowels become regular            Diagnostic Studies: Dg Cervical Spine 2-3 Views  12/22/2011  *RADIOLOGY REPORT*  Clinical Data: ACDF.  DG C-ARM  GT 120 MIN,CERVICAL SPINE - 2-3 VIEW  Comparison: None.  Findings: Changes of three-level anterior fusion. It is difficult to determine the exact level.  On the frontal view, this appears to be from C5-T1.  IMPRESSION: Three-level anterior cervical fusion as above.  Original Report Authenticated By: Cyndie Chime, M.D.   Dg Cervical Spine 2-3 Views  12/22/2011  *RADIOLOGY REPORT*  Clinical Data: Postop cervical spine fusion.  CERVICAL SPINE - 2-3 VIEW  Comparison: 12/22/2011.  Findings: Intraoperative AP and lateral views of the cervical spine show C5-T1 anterior cervical fusion with interbody spacers.  No hardware complications.  Alignment is anatomic.  Mild endplate degenerative changes are seen anteriorly at C4-5.  Minimal prevertebral soft tissue swelling.  IMPRESSION: C5-T1 anterior cervical fusion without complicating feature.  Original Report Authenticated By: Reyes Ivan, M.D.   Dg Cervical Spine 2-3 Views  12/20/2011  *RADIOLOGY REPORT*  Clinical Data: Preop  CERVICAL SPINE - 2-3 VIEW  Comparison: 04/21/2005  Findings: Two views of cervical spine submitted.  No acute fracture or subluxation.  Again noted degenerative changes with disc space flattening and mild anterior spurring C4-C5 and C5-C6 C6-C7 and C 7T1 level.  Most significant disc space flattening at C6-C7 level. No prevertebral soft tissue swelling.  Cervical airway is patent.  IMPRESSION: No acute fracture or subluxation.  Degenerative changes as described above.  Original Report Authenticated By: Natasha Mead, M.D.   Dg C-arm Gt 120 Min  12/22/2011  *RADIOLOGY REPORT*  Clinical Data: ACDF.  DG C-ARM GT 120 MIN,CERVICAL SPINE - 2-3 VIEW  Comparison: None.  Findings: Changes of three-level anterior fusion. It is difficult to determine the exact level.  On the frontal view, this appears to be from C5-T1.  IMPRESSION: Three-level anterior cervical fusion as above.  Original Report Authenticated By: Cyndie Chime, M.D.    Discharge  Orders    Future Orders Please Complete By Expires   Diet - low sodium heart healthy      Call MD / Call 911      Comments:   If you experience chest pain or shortness of breath, CALL 911 and be transported to the hospital emergency room.  If you develope a fever above 101 F, pus (white drainage) or increased drainage or redness  at the wound, or calf pain, call your surgeon's office.   Constipation Prevention      Comments:   Drink plenty of fluids.  Prune juice may be helpful.  You may use a stool softener, such as Colace (over the counter) 100 mg twice a day.  Use MiraLax (over the counter) for constipation as needed.   Increase activity slowly as tolerated      Discharge instructions      Comments:   Keep incision clean and dry.  Leave steri strips in place.  May shower 5 days from surgery; pat to dry following shower.  May redress with clean, dry dressing if you would like.  Do not apply any lotion/cream/ointment to the incision.      Driving restrictions      Comments:   No driving for 2 weeks.  Dr Shon Baton will discuss addition driving restrictions at your first post-op visit in 2 weeks.      Lifting restrictions      Comments:   No lifting anything greater than 5 pounds.  DO NOT reach overhead (above shoulder height).  NO bending, stooping or squatting.  Dr. Shon Baton will discuss additional lifting restrictions at your first post-op visit in 2 weeks.       Follow-up Information    Follow up with Alvy Beal, MD in 2 weeks.   Contact information:   [facility] [address] [place] [address] [phone]          Discharge Plan:  discharge to Home   Disposition: STABLE    Signed: Gwinda Maine 12/23/2011, 7:50 AM

## 2011-12-23 NOTE — Progress Notes (Signed)
Clinical Social Worker received routine referral for new snf placement. Clinical Social Worker discussed with RNCM who stated recommendation is for home health and pt plans to discharge today. Inappropriate Clinical Social Worker referral. Visual merchandiser signed off.   Jacklynn Lewis, MSW, LCSWA  Clinical Social Work [phone]

## 2011-12-23 NOTE — Discharge Instructions (Signed)
Home Health PT/OT arranged with [facility]/[phone]

## 2011-12-27 ENCOUNTER — Encounter (HOSPITAL_COMMUNITY): Payer: Self-pay | Admitting: Orthopedic Surgery

## 2012-02-16 ENCOUNTER — Emergency Department (HOSPITAL_COMMUNITY)
Admission: EM | Admit: 2012-02-16 | Discharge: 2012-02-17 | Disposition: A | Discharge status: Home / Self Care | Payer: BC Managed Care – PPO | Attending: Emergency Medicine | Admitting: Emergency Medicine

## 2012-02-16 ENCOUNTER — Encounter (HOSPITAL_COMMUNITY): Payer: Self-pay | Admitting: *Deleted

## 2012-02-16 DIAGNOSIS — F17200 Nicotine dependence, unspecified, uncomplicated: Secondary | ICD-10-CM | POA: Insufficient documentation

## 2012-02-16 DIAGNOSIS — M542 Cervicalgia: Secondary | ICD-10-CM | POA: Insufficient documentation

## 2012-02-16 DIAGNOSIS — Z981 Arthrodesis status: Secondary | ICD-10-CM | POA: Insufficient documentation

## 2012-02-16 NOTE — ED Notes (Signed)
Pain in neck since 5pm.  Had surgery on neck in May.    Pt had turned his head and felt a pop in his neck.

## 2012-02-17 ENCOUNTER — Emergency Department (HOSPITAL_COMMUNITY): Payer: BC Managed Care – PPO

## 2012-02-17 MED ORDER — HYDROMORPHONE HCL PF 1 MG/ML IJ SOLN
1.0000 mg | Freq: Once | INTRAMUSCULAR | Status: AC
  Administered 2012-02-17: 1 mg via INTRAMUSCULAR
  Filled 2012-02-17: qty 1

## 2012-02-17 MED ORDER — ONDANSETRON 8 MG PO TBDP
8.0000 mg | ORAL_TABLET | Freq: Once | ORAL | Status: AC
  Administered 2012-02-17: 8 mg via ORAL
  Filled 2012-02-17: qty 1

## 2012-02-17 MED ORDER — OXYCODONE-ACETAMINOPHEN 10-325 MG PO TABS: 1.0000 | ORAL_TABLET | ORAL | Status: AC | PRN

## 2012-02-17 NOTE — ED Notes (Signed)
Pt given a coke to drink.  Pt drinking coke through straw no difficulty noted.

## 2012-02-17 NOTE — ED Provider Notes (Signed)
History     CSN: [id number]  Arrival date & time 02/16/12  2224   First MD Initiated Contact with Patient 02/17/12 0005      Chief Complaint  Patient presents with  . Neck Pain    (Consider location/radiation/quality/duration/timing/severity/associated sxs/prior treatment) HPI Comments: Patient that had a cervical fusion in May 2013 c/o sharp pain to his left anterior neck that began suddenly when he turned his head.  States he felt a "pop" in his neck and the pain has been severe since that time. Also having pain to his cervical spine.   Pain is reproduced with movement , swallowing and palpation.  He states that he can swallow fluids, but cannot swallow solid food due to level of pain.  Also reports occasional vomiting after eating food.  He denies fever, numbness, or headache. States he cannot see his surgeon, Dr. Shon Baton, until Wednesday.    Patient is a 51 y.o. male presenting with neck pain. The history is provided by the patient.  Neck Pain  This is a new problem. The current episode started 6 to 12 hours ago. The problem occurs constantly. The problem has not changed since onset.Associated with: turning his head. There has been no fever. The pain is present in the left side. The quality of the pain is described as aching (sharp). The pain does not radiate. The pain is moderate. The symptoms are aggravated by swallowing, position and twisting. Pertinent negatives include no photophobia, no visual change, no chest pain, no syncope, no numbness, no headaches, no bowel incontinence, no bladder incontinence, no leg pain, no paresis, no tingling and no weakness. He has tried nothing for the symptoms. The treatment provided no relief.    Past Medical History  Diagnosis Date  . Back pain   . Spider bite 2012  . No pertinent past medical history     Past Surgical History  Procedure Date  . Back surgery   . Lumbar fusion 2000  . Cholecystectomy 2009  . Anterior cervical  decomp/discectomy fusion 12/22/2011    Procedure: ANTERIOR CERVICAL DECOMPRESSION/DISCECTOMY FUSION 3 LEVELS;  Surgeon: Venita Lick, MD;  Location: [facility] OR;  Service: Orthopedics;  Laterality: N/A;  ACDF C5-T1    Family History  Problem Relation Age of Onset  . Anesthesia problems Neg Hx     History  Substance Use Topics  . Smoking status: Current Everyday Smoker -- 1.0 packs/day for 30 years    Types: Cigarettes  . Smokeless tobacco: Never Used  . Alcohol Use: No      Review of Systems  Constitutional: Negative for fever and chills.  HENT: Positive for trouble swallowing and neck pain. Negative for facial swelling, neck stiffness and voice change.   Eyes: Negative for photophobia.  Cardiovascular: Negative for chest pain and syncope.  Gastrointestinal: Negative for bowel incontinence.  Genitourinary: Negative for bladder incontinence, dysuria and difficulty urinating.  Musculoskeletal: Positive for joint swelling and arthralgias.  Skin: Negative for color change and wound.  Neurological: Negative for dizziness, tingling, facial asymmetry, weakness, numbness and headaches.  All other systems reviewed and are negative.    Allergies  Review of patient's allergies indicates no known allergies.  Home Medications   Current Outpatient Rx  Name Route Sig Dispense Refill  . ALPRAZOLAM 0.5 MG PO TABS Oral Take 0.5 mg by mouth every 8 (eight) hours.      BP 112/79  Pulse 88  Temp 98.3 F (36.8 C) (Oral)  Resp 20  Ht 6' (1.829  m)  Wt 180 lb (81.647 kg)  BMI 24.41 kg/m2  SpO2 100%  Physical Exam  Nursing note and vitals reviewed. Constitutional: He is oriented to person, place, and time. He appears well-developed and well-nourished. No distress.  HENT:  Head: Normocephalic and atraumatic. No trismus in the jaw.  Mouth/Throat: Uvula is midline, oropharynx is clear and moist and mucous membranes are normal. No uvula swelling.  Eyes: EOM are normal. Pupils are equal, round,  and reactive to light.  Neck: Phonation normal. Neck supple. Spinous process tenderness and muscular tenderness present. No rigidity. No tracheal deviation present. No Brudzinski's sign and no Kernig's sign noted. No mass present.    Cardiovascular: Normal rate, regular rhythm, normal heart sounds and intact distal pulses.   No murmur heard. Pulmonary/Chest: Effort normal and breath sounds normal. No stridor.  Musculoskeletal: Normal range of motion.  Lymphadenopathy:    He has no cervical adenopathy.  Neurological: He is alert and oriented to person, place, and time. No cranial nerve deficit or sensory deficit. He exhibits normal muscle tone. Coordination and gait normal.  Skin: Skin is warm and dry.    ED Course  Procedures (including critical care time)  Labs Reviewed - No data to display  Dg Cervical Spine Complete  02/17/2012  *RADIOLOGY REPORT*  Clinical Data: Cervical fusion on Dec 22, 2011.  Acute onset of severe neck pain.  CERVICAL SPINE - COMPLETE 4+ VIEW  Comparison: 12/22/2011  Findings: Fusion of C5-T1 with anterior plate and screw and interbody metallic spacers.  Normal alignment of cervical vertebra. No change in alignment a fused segments.  Fusion hardware appears intact without change in appearance.  No prevertebral soft tissue swelling.  Degenerative changes with narrowed interspaces and endplate hypertrophic changes at C3-4 and C4-5.  Normal alignment of the facet joints.  No focal bone lesion or bone destruction. Lateral masses of C1 appear symmetrical.  The odontoid process appears intact.  No significant changes since previous study.  IMPRESSION: Postoperative changes with anterior fusion and interbody spacers from C5-T1.  Fused segments appear intact and hardware appears well seated.  No change since prior study.  Original Report Authenticated By: Marlon Pel, M.D.      MDM    Pt has ttp of the left anterior neck. Pain also reproduced with swallowing.  No  obvious deformity.  No focal neuro deficits.  Airway is patent.  No speech difficulties.  Swallowing fluids w/o difficulty  I consulted radiology regarding patient's complaints and need for further imaging, advised to get non-contrast CT of C spine.  Patient medicated with IM Dilaudid, ODT zofran.     Discussed patient's care with EDP. Patient agrees to close f/u with his Careers adviser.  The patient appears reasonably screened and/or stabilized for discharge and I doubt any other medical condition or other [facility] requiring further screening, evaluation, or treatment in the ED at this time prior to discharge.   Prescribed: Percocet 10 #20  Bradley Rice L. [place], [place] 02/17/12 0202

## 2012-02-17 NOTE — ED Provider Notes (Signed)
Medical screening examination/treatment/procedure(s) were performed by non-physician practitioner and as supervising physician I was immediately available for consultation/collaboration.  Nicoletta Dress. Colon Branch, MD 02/17/12 1945

## 2012-03-12 ENCOUNTER — Other Ambulatory Visit: Payer: Self-pay | Admitting: Orthopedic Surgery

## 2012-03-12 DIAGNOSIS — M542 Cervicalgia: Secondary | ICD-10-CM

## 2012-03-14 ENCOUNTER — Ambulatory Visit
Admission: RE | Admit: 2012-03-14 | Discharge: 2012-03-14 | Disposition: A | Discharge status: Home / Self Care | Payer: BC Managed Care – PPO | Source: Ambulatory Visit | Attending: Orthopedic Surgery | Admitting: Orthopedic Surgery

## 2012-03-14 VITALS — BP 113/63 | HR 73 | Ht 72.0 in | Wt 174.0 lb

## 2012-03-14 DIAGNOSIS — M542 Cervicalgia: Secondary | ICD-10-CM

## 2012-03-14 MED ORDER — HYDROMORPHONE HCL PF 2 MG/ML IJ SOLN
2.0000 mg | Freq: Once | INTRAMUSCULAR | Status: AC
  Administered 2012-03-14: 2 mg via INTRAMUSCULAR

## 2012-03-14 MED ORDER — OXYCODONE-ACETAMINOPHEN 5-325 MG PO TABS
2.0000 | ORAL_TABLET | Freq: Once | ORAL | Status: AC
  Administered 2012-03-14: 2 via ORAL

## 2012-03-14 MED ORDER — HYDROXYZINE HCL 50 MG/ML IM SOLN
25.0000 mg | Freq: Four times a day (QID) | INTRAMUSCULAR | Status: DC | PRN
  Administered 2012-03-14: 25 mg via INTRAMUSCULAR

## 2012-03-14 MED ORDER — IOHEXOL 300 MG/ML  SOLN
10.0000 mL | Freq: Once | INTRAMUSCULAR | Status: AC | PRN
  Administered 2012-03-14: 10 mL via INTRATHECAL

## 2012-03-14 MED ORDER — DIAZEPAM 5 MG PO TABS
10.0000 mg | ORAL_TABLET | Freq: Once | ORAL | Status: AC
  Administered 2012-03-14: 10 mg via ORAL

## 2012-03-14 NOTE — Progress Notes (Signed)
Patient medicated again for continued severe headache.  Ice pack to back of neck.  Patient allowed to sit straight up in bed for about five minutes to try to relieve headache.  Minimal relief.  jkl

## 2012-03-14 NOTE — Progress Notes (Signed)
Ice pack to back of neck, curtain pulled around patient to reduce light in eyes, repositioned for pain.

## 2012-03-29 ENCOUNTER — Other Ambulatory Visit: Payer: Self-pay | Admitting: Specialist

## 2012-03-29 DIAGNOSIS — M4310 Spondylolisthesis, site unspecified: Secondary | ICD-10-CM

## 2012-04-05 ENCOUNTER — Ambulatory Visit (HOSPITAL_COMMUNITY)
Admission: RE | Admit: 2012-04-05 | Discharge: 2012-04-05 | Disposition: A | Discharge status: Home / Self Care | Payer: BC Managed Care – PPO | Source: Ambulatory Visit | Attending: Specialist | Admitting: Specialist

## 2012-04-05 DIAGNOSIS — R1310 Dysphagia, unspecified: Secondary | ICD-10-CM

## 2012-04-05 DIAGNOSIS — R1319 Other dysphagia: Secondary | ICD-10-CM | POA: Insufficient documentation

## 2012-04-05 NOTE — Procedures (Signed)
Objective Swallowing Evaluation: Modified Barium Swallowing Study  Patient Details  Name: Bradley Rice MRN: [id number] Date of Birth: [birth year]  Today's Date: 04/05/2012 Time: 1110-1200 SLP Time Calculation (min): 50 min  Past Medical History:  Past Medical History  Diagnosis Date  . Back pain   . Spider bite 2012  . No pertinent past medical history    Past Surgical History:  Past Surgical History  Procedure Date  . Back surgery   . Lumbar fusion 2000  . Cholecystectomy 2009  . Anterior cervical decomp/discectomy fusion 12/22/2011    Procedure: ANTERIOR CERVICAL DECOMPRESSION/DISCECTOMY FUSION 3 LEVELS;  Surgeon: Venita Lick, MD;  Location: [facility] OR;  Service: Orthopedics;  Laterality: N/A;  ACDF C5-T1   HPI:  Pt is a 51 year old who underwent ACDF from C5-6, C6-7, C7-T1 in May of 2013. Pt reports that since surgery he has ahad significant difficutly eating and drinking with frequent episodes of coughing and choking, in some instances requiring the heimlich maneuver. He also reports that frequently after choking spells he has excessive vomiting. He avoids meats, crushes his pills and has lost over 20 lbs since his surgery. There are plans to performs a posterior fusion as well.      Assessment / Plan / Recommendation Clinical Impression  Dysphagia Diagnosis: Moderate cervical esophageal phase dysphagia Clinical impression: [name] presents with a moderate cervical esophageal dysphagia secondary to cervical hardware impingeing on opening of UES and and proximal esophagus. Despite adequate strength of pharyngeal musculature, pt has a mild to moderate residual with all boluses due to imcomplete transit of bolus through tight UES. Pt independently utilized a supraglottic swallow with earlya nd prolonged laryngeal elevation and multiple swallows with each attempt. A chin tuck was found to improve opening of UES for imporved transit of bolus. Pt reports he may lose mobility for chin tuck with  next surgery. No aspiraiton or penetration occurred during this study, nor did any vomiting. PT is at risk of aspiraiton though feel risk of aspiration pna is low due to pts age and health. Pt likely to have progressive chronic dysphagia with age. At this time recommend a mehcanical soft diet with ground meats and thin liquids. Explained to pt. Strategies and education provided.     Treatment Recommendation  No treatment recommended at this time    Diet Recommendation Dysphagia 3 (Mechanical Soft);Thin liquid   Liquid Administration via: Cup;Straw Medication Administration: Crushed with puree Supervision: Patient able to self feed Compensations: Slow rate;Small sips/bites;Multiple dry swallows after each bite/sip;Effortful swallow Postural Changes and/or Swallow Maneuvers: Seated upright 90 degrees;Upright 30-60 min after meal;Chin tuck;Hold breath before and during swallow (Supraglottic swallow)    Other  Recommendations     Follow Up Recommendations       Frequency and Duration        Pertinent Vitals/Pain NA    SLP Swallow Goals     General HPI: Pt is a 51 year old who underwent ACDF from C5-6, C6-7, C7-T1 in May of 2013. Pt reports that since surgery he has ahad significant difficutly eating and drinking with frequent episodes of coughing and choking, in some instances requiring the heimlich maneuver. He also reports that frequently after choking spells he has excessive vomiting. He avoids meats, crushes his pills and has lost over 20 lbs since his surgery. There are plans to performs a posterior fusion as well.  Type of Study: Modified Barium Swallowing Study Reason for Referral: Objectively evaluate swallowing function Diet Prior to this  Study: Regular;Thin liquids Temperature Spikes Noted: N/A Respiratory Status: Room air History of Recent Intubation: No Behavior/Cognition: Alert;Cooperative;Pleasant mood Oral Cavity - Dentition: Adequate natural dentition Oral Motor /  Sensory Function: Within functional limits Self-Feeding Abilities: Able to feed self Patient Positioning: Upright in chair Baseline Vocal Quality: Clear Volitional Cough: Strong Volitional Swallow: Able to elicit Anatomy: Other (Comment) (long, large plate on anterior spine from C5 to T1. )    Reason for Referral Objectively evaluate swallowing function   Oral Phase     Pharyngeal Phase Pharyngeal Phase: Impaired   Cervical Esophageal Phase    GO Functional Assessment Tool Used:  (clinical judgement) Functional Limitations: Swallowing Swallow Current Status (W2956): At least 20 percent but less than 40 percent impaired, limited or restricted Swallow Discharge Status [phone]): At least 20 percent but less than 40 percent impaired, limited or restricted  Cervical Esophageal Phase: Impaired    Bradley Rice, Bradley Rice 04/05/2012, 2:17 PM

## 2012-04-06 ENCOUNTER — Ambulatory Visit
Admission: RE | Admit: 2012-04-06 | Discharge: 2012-04-06 | Disposition: A | Discharge status: Home / Self Care | Payer: BC Managed Care – PPO | Source: Ambulatory Visit | Attending: Specialist | Admitting: Specialist

## 2012-04-06 VITALS — BP 118/76 | HR 85

## 2012-04-06 DIAGNOSIS — M4310 Spondylolisthesis, site unspecified: Secondary | ICD-10-CM

## 2012-04-06 MED ORDER — DIAZEPAM 5 MG PO TABS
10.0000 mg | ORAL_TABLET | Freq: Once | ORAL | Status: AC
  Administered 2012-04-06: 10 mg via ORAL

## 2012-04-09 ENCOUNTER — Ambulatory Visit
Admission: RE | Admit: 2012-04-09 | Discharge: 2012-04-09 | Disposition: A | Discharge status: Home / Self Care | Payer: BC Managed Care – PPO | Source: Ambulatory Visit | Attending: Specialist | Admitting: Specialist

## 2012-04-09 VITALS — BP 84/56 | HR 104

## 2012-04-09 DIAGNOSIS — M4310 Spondylolisthesis, site unspecified: Secondary | ICD-10-CM

## 2012-04-09 MED ORDER — HYDROMORPHONE HCL PF 2 MG/ML IJ SOLN
2.0000 mg | Freq: Once | INTRAMUSCULAR | Status: AC
  Administered 2012-04-09: 2 mg via INTRAMUSCULAR

## 2012-04-09 MED ORDER — IOHEXOL 180 MG/ML  SOLN
15.0000 mL | Freq: Once | INTRAMUSCULAR | Status: AC | PRN
  Administered 2012-04-09: 15 mL via INTRATHECAL

## 2012-04-09 MED ORDER — OXYCODONE-ACETAMINOPHEN 5-325 MG PO TABS
2.0000 | ORAL_TABLET | Freq: Once | ORAL | Status: AC
  Administered 2012-04-09: 2 via ORAL

## 2012-04-09 MED ORDER — DIAZEPAM 5 MG PO TABS
10.0000 mg | ORAL_TABLET | Freq: Once | ORAL | Status: AC
  Administered 2012-04-09: 10 mg via ORAL

## 2012-04-09 MED ORDER — ONDANSETRON HCL 4 MG/2ML IJ SOLN
4.0000 mg | Freq: Once | INTRAMUSCULAR | Status: AC
  Administered 2012-04-09: 4 mg via INTRAMUSCULAR

## 2012-04-09 NOTE — Progress Notes (Signed)
Pt c/o headache post myelo that was unrelieved with his pain shot. Percocet given and cold pack to neck. Pt felt some better at discharge, stated that the cold pack helped the most.

## 2012-05-14 ENCOUNTER — Other Ambulatory Visit: Payer: Self-pay | Admitting: Otolaryngology

## 2012-05-14 DIAGNOSIS — R1319 Other dysphagia: Secondary | ICD-10-CM

## 2012-06-18 ENCOUNTER — Other Ambulatory Visit (HOSPITAL_COMMUNITY): Payer: Self-pay | Admitting: Specialist

## 2012-06-18 ENCOUNTER — Encounter (HOSPITAL_COMMUNITY): Payer: Self-pay | Admitting: Respiratory Therapy

## 2012-06-20 ENCOUNTER — Encounter (HOSPITAL_COMMUNITY): Payer: Self-pay

## 2012-06-20 ENCOUNTER — Ambulatory Visit (HOSPITAL_COMMUNITY)
Admission: RE | Admit: 2012-06-20 | Discharge: 2012-06-20 | Disposition: A | Discharge status: Home / Self Care | Payer: BC Managed Care – PPO | Source: Ambulatory Visit | Attending: Anesthesiology | Admitting: Anesthesiology

## 2012-06-20 ENCOUNTER — Encounter (HOSPITAL_COMMUNITY)
Admission: RE | Admit: 2012-06-20 | Discharge: 2012-06-20 | Disposition: A | Discharge status: Home / Self Care | Payer: BC Managed Care – PPO | Source: Ambulatory Visit | Attending: Specialist | Admitting: Specialist

## 2012-06-20 DIAGNOSIS — F17200 Nicotine dependence, unspecified, uncomplicated: Secondary | ICD-10-CM | POA: Insufficient documentation

## 2012-06-20 DIAGNOSIS — Z01818 Encounter for other preprocedural examination: Secondary | ICD-10-CM | POA: Insufficient documentation

## 2012-06-20 HISTORY — DX: Dysphagia, unspecified: R13.10

## 2012-06-20 HISTORY — DX: Chronic obstructive pulmonary disease, unspecified: J44.9

## 2012-06-20 LAB — BASIC METABOLIC PANEL
BUN: 5 mg/dL — ABNORMAL LOW (ref 6–23)
CO2: 29 mEq/L (ref 19–32)
Glucose, Bld: 115 mg/dL — ABNORMAL HIGH (ref 70–99)
Potassium: 4 mEq/L (ref 3.5–5.1)
Sodium: 139 mEq/L (ref 135–145)

## 2012-06-20 LAB — URINALYSIS, ROUTINE W REFLEX MICROSCOPIC
Glucose, UA: NEGATIVE mg/dL
Leukocytes, UA: NEGATIVE
Specific Gravity, Urine: 1.016 (ref 1.005–1.030)
pH: 6 (ref 5.0–8.0)

## 2012-06-20 LAB — CBC
Hemoglobin: 14.6 g/dL (ref 13.0–17.0)
MCH: 31.4 pg (ref 26.0–34.0)
MCHC: 34.1 g/dL (ref 30.0–36.0)
MCV: 92 fL (ref 78.0–100.0)
RBC: 4.65 MIL/uL (ref 4.22–5.81)

## 2012-06-20 NOTE — Progress Notes (Signed)
Patient reports that he has had difficulty swallowing since last surgery. Patient reports that he has to modify diet and that there are certain things that he cannot eat. Patient reports that he was told that this was due to the plate being to low and that it may resolve with the additional surgery. Will send chart to anesthesia for review.

## 2012-06-20 NOTE — Pre-Procedure Instructions (Signed)
20 NAHSHON GENO  06/20/2012   Your procedure is scheduled on:  November 25  Report to [facility] at 05:30 AM.  Call this number if you have problems the morning of surgery: [phone]   Remember:   Do not eat or drink:After Midnight.  Take these medicines the morning of surgery with A SIP OF WATER: Oxycodone,    Do not wear jewelry, make-up or nail polish.  Do not wear lotions, powders, or perfumes. You may wear deodorant.  Do not shave 48 hours prior to surgery. Men may shave face and neck.  Do not bring valuables to the hospital.  Contacts, dentures or bridgework may not be worn into surgery.  Leave suitcase in the car. After surgery it may be brought to your room.  For patients admitted to the hospital, checkout time is 11:00 AM the day of discharge.   Special Instructions: Shower using CHG 2 nights before surgery and the night before surgery.  If you shower the day of surgery use CHG.  Use special wash - you have one bottle of CHG for all showers.  You should use approximately 1/3 of the bottle for each shower.   Please read over the following fact sheets that you were given: Pain Booklet, Coughing and Deep Breathing, Blood Transfusion Information and Surgical Site Infection Prevention

## 2012-06-21 NOTE — Consult Note (Signed)
Anesthesia chart review: Patient is a 51 year old male scheduled for removal of anterior plate W0-J8, explore fusion, left C5-6, C6-7, C7-T1 redo foraminotomies, left open carpal tunnel release with release of the ulna at [facility], posterior cervical fusion with lateral mass screws and rods C5-T1, iliac crest bone graft by Dr. Otelia Sergeant on 06/25/2012. He is status post C5-6, C6-7, C7-T1 ACDF on 12/22/2011. He reports some dysphagia since his last surgery that has required some diet modification. Other history includes smoking, COPD. PCP is Dr. Rudi Heap.  EKG from 06/20/2012 showed normal sinus rhythm.  Chest x-ray from 06/20/2012 showed chronic perihilar and bibasilar interstitial changes without acute or superimposed abnormality.  Labs noted.  He will be evaluated by his assigned anesthesiologist on the day of surgery, but if no significant change in his status then anticipate he can proceed as planned.  Shonna Chock, PA-C

## 2012-06-21 NOTE — H&P (Signed)
Patient examined, lab, and H&P reviewed with Dondra Spry.

## 2012-06-21 NOTE — H&P (Addendum)
Bradley Rice is an 51 y.o. male.   Chief Complaint: neck and arm pain,  Difficulty swallowing HPI: This patient has had previous 3-level anterior cervical diskectomy and fusion at the C5-6, C6-7, and C7-T1 levels with titanium cages implants that make it somewhat difficult to tell the healing of the interbody surfaces.  It showed no loosening with flexion and extension X-rays done in August, there is no motion noted across any of the segments.  He is still having multiple sematic complaints referred to his neck, to his arms and shoulders.  The results of his evaluation by Dr. Lazarus Salines in office evaluation of a separate airway, trachea, esophagus noted that he did have some impression on the posterior aspect of the retropharynx and esophagus relative to the plate and screws and these were prominent. He has some persistent complaints of pain into his left arm at C5-6 distribution into his left hand.  Studies in the past have not really demonstrated any significant radicular findings preoperatively prior to his 3-level fusion.  This patient's upper airway has been evaluated by Dr. Lazarus Salines.  He assessed and felt as though the primary problem was plate and its impingement on the retropharyngeal area.  In reviewing this patient's previous MRI scan, there was some note of mild dilatation of the esophagus in the superior mediastinum and this is below the level of his plate.  A note indicated that consideration of endoscopy or barium swallow was indicated.  Dr. Lazarus Salines has evaluated the patient and had no further recommendations in regards to further testing, so that I think he is comfortable with current diagnosis.  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  Cords were noted to be normal.  A electrodiagnostic study is ABNORMAL and reveals evidence of: 1. 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  2. 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  With these findings and the pts continued symptoms we will proceed with surgical intervention.  He will undergo removal of anterior cervical plate Z6-X0 ,exploration of fusion, redo foraminotomies at C5-6,C6-7 and C7-T1.  Left open carpal tunnel release with release of the ulnar nerve at Guyon's canal.  Posterior cervical fusion with lateral mass screws and rods C5-T1 with iliac crest bone graft. Past Medical History  Diagnosis Date  . Back pain   . Spider bite 2012  . COPD (chronic obstructive pulmonary disease)   . Dysphagia     patient reports d/t surgery, has modified diet, has to tilt head to swallow     Past Surgical History  Procedure Date  . Back surgery   . Lumbar fusion 2000  . Cholecystectomy 2009  . Anterior cervical decomp/discectomy fusion 12/22/2011    Procedure: ANTERIOR CERVICAL DECOMPRESSION/DISCECTOMY FUSION 3 LEVELS;  Surgeon: Venita Lick, MD;  Location: [facility] OR;  Service: Orthopedics;  Laterality: N/A;  ACDF C5-T1    Family History  Problem Relation Age of Onset  . Anesthesia problems Neg Hx    Social History:  reports that he has been smoking Cigarettes.  He has a 30 pack-year smoking history. He has never used smokeless tobacco. He reports that he does not drink alcohol or use illicit drugs.  Allergies: No Known Allergies  Medications Prior to Admission  Medication Sig Dispense Refill  . Cyanocobalamin (VITAMIN B-12 PO) Take 1,200 mcg by mouth  daily.      . methocarbamol (ROBAXIN) 500 MG tablet Take 500 mg by mouth every 6 (six) hours as needed. For muscle spasms      . oxyCODONE (OXYCONTIN) 20 MG 12 hr tablet Take 20 mg by mouth every 12 (twelve) hours.      Marland Kitchen oxyCODONE-acetaminophen (PERCOCET/ROXICET) 5-325 MG per tablet Take 1 tablet by mouth every 4 (four) hours as needed. For pain        No results found for this or any previous  visit (from the past 48 hour(s)). No results found.  Review of Systems  Constitutional: Negative.   HENT: Positive for sore throat and neck pain.   Eyes: Negative.   Respiratory: Negative.   Cardiovascular: Negative.   Gastrointestinal:       Difficulty swallowing with choking  Genitourinary: Negative.   Musculoskeletal: Positive for back pain and joint pain.  Skin: Negative.   Neurological: Positive for tingling and headaches.  Endo/Heme/Allergies: Negative.   Psychiatric/Behavioral: Negative.     Blood pressure 102/66, pulse 96, temperature 98.4 F (36.9 C), temperature source Oral, resp. rate 18, SpO2 97.00%. Physical Exam  Constitutional: He is oriented to person, place, and time. He appears well-developed and well-nourished.  HENT:  Head: Normocephalic and atraumatic.  Eyes: EOM are normal. Pupils are equal, round, and reactive to light.  Neck:       Stiffness with ROM in all planes of the neck  Cardiovascular: Normal rate and regular rhythm.   Respiratory: Effort normal and breath sounds normal.  GI: Soft.  Musculoskeletal:        upper extremity motor is normal.  His reflexes at the biceps are 2+ and symmetric, triceps 2+ and symmetric, brachioradialis 2+ and symmetric.  Hoffmann sign is negative.  The incision, left neck, is well healed.  The patient's lumbar exam shows his straight leg raise is negative both sides, popliteal compression sign negative both sides.  Motor in both lower extremities is entirely normal.  Sciatic stretch tests are negative.  There is no clonus, no spasticity noted in either lower extremity.    Neurological: He is alert and oriented to person, place, and time.  Skin: Skin is warm and dry.  Psychiatric: He has a normal mood and affect.     Assessment/Plan 1. Prominent cervical plate anterior neck 2. 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 Left carpal tunnel syndrome 5. 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  Left open carpal tunnel release with release of the ulnar nerve at Guyon's canal.  Posterior cervical fusion with lateral mass screws and rods C5-T1 with iliac crest bone graft.  Ivee Poellnitz M 06/25/2012, 7:52 AM

## 2012-06-24 MED ORDER — CEFAZOLIN SODIUM-DEXTROSE 2-3 GM-% IV SOLR
2.0000 g | INTRAVENOUS | Status: AC
  Administered 2012-06-25 (×2): 2 g via INTRAVENOUS
  Filled 2012-06-24 (×2): qty 50

## 2012-06-25 ENCOUNTER — Inpatient Hospital Stay (HOSPITAL_COMMUNITY): Payer: BC Managed Care – PPO

## 2012-06-25 ENCOUNTER — Encounter (HOSPITAL_COMMUNITY)
Admission: RE | Disposition: A | Discharge status: Home / Self Care | Payer: Self-pay | Source: Ambulatory Visit | Attending: Specialist

## 2012-06-25 ENCOUNTER — Encounter (HOSPITAL_COMMUNITY): Payer: Self-pay | Admitting: Vascular Surgery

## 2012-06-25 ENCOUNTER — Inpatient Hospital Stay (HOSPITAL_COMMUNITY)
Admission: RE | Admit: 2012-06-25 | Discharge: 2012-06-26 | DRG: 864 | Disposition: A | Discharge status: Home / Self Care | Payer: BC Managed Care – PPO | Source: Ambulatory Visit | Attending: Specialist | Admitting: Specialist

## 2012-06-25 ENCOUNTER — Encounter (HOSPITAL_COMMUNITY): Payer: Self-pay | Admitting: *Deleted

## 2012-06-25 ENCOUNTER — Inpatient Hospital Stay (HOSPITAL_COMMUNITY): Payer: BC Managed Care – PPO | Admitting: Vascular Surgery

## 2012-06-25 DIAGNOSIS — Z9889 Other specified postprocedural states: Secondary | ICD-10-CM

## 2012-06-25 DIAGNOSIS — M960 Pseudarthrosis after fusion or arthrodesis: Secondary | ICD-10-CM | POA: Diagnosis present

## 2012-06-25 DIAGNOSIS — M47819 Spondylosis without myelopathy or radiculopathy, site unspecified: Secondary | ICD-10-CM | POA: Diagnosis present

## 2012-06-25 DIAGNOSIS — J449 Chronic obstructive pulmonary disease, unspecified: Secondary | ICD-10-CM | POA: Diagnosis present

## 2012-06-25 DIAGNOSIS — G5600 Carpal tunnel syndrome, unspecified upper limb: Secondary | ICD-10-CM | POA: Diagnosis present

## 2012-06-25 DIAGNOSIS — T8489XA Other specified complication of internal orthopedic prosthetic devices, implants and grafts, initial encounter: Secondary | ICD-10-CM | POA: Diagnosis present

## 2012-06-25 DIAGNOSIS — G5602 Carpal tunnel syndrome, left upper limb: Secondary | ICD-10-CM | POA: Diagnosis present

## 2012-06-25 DIAGNOSIS — Z9089 Acquired absence of other organs: Secondary | ICD-10-CM

## 2012-06-25 DIAGNOSIS — J4489 Other specified chronic obstructive pulmonary disease: Secondary | ICD-10-CM | POA: Diagnosis present

## 2012-06-25 DIAGNOSIS — M47812 Spondylosis without myelopathy or radiculopathy, cervical region: Principal | ICD-10-CM | POA: Diagnosis present

## 2012-06-25 DIAGNOSIS — IMO0002 Reserved for concepts with insufficient information to code with codable children: Secondary | ICD-10-CM | POA: Diagnosis present

## 2012-06-25 DIAGNOSIS — Y838 Other surgical procedures as the cause of abnormal reaction of the patient, or of later complication, without mention of misadventure at the time of the procedure: Secondary | ICD-10-CM | POA: Diagnosis present

## 2012-06-25 DIAGNOSIS — Z79899 Other long term (current) drug therapy: Secondary | ICD-10-CM

## 2012-06-25 HISTORY — PX: CARPAL TUNNEL RELEASE: SHX101

## 2012-06-25 HISTORY — PX: ANTERIOR CERVICAL DECOMP/DISCECTOMY FUSION: SHX1161

## 2012-06-25 HISTORY — PX: POSTERIOR CERVICAL FUSION/FORAMINOTOMY: SHX5038

## 2012-06-25 SURGERY — ANTERIOR CERVICAL DECOMPRESSION/DISCECTOMY FUSION 1 LEVEL/HARDWARE REMOVAL
Anesthesia: General | Site: Wrist | Wound class: Clean

## 2012-06-25 MED ORDER — SODIUM CHLORIDE 0.9 % IJ SOLN
3.0000 mL | Freq: Two times a day (BID) | INTRAMUSCULAR | Status: DC
  Administered 2012-06-25: 3 mL via INTRAVENOUS

## 2012-06-25 MED ORDER — LACTATED RINGERS IV SOLN
INTRAVENOUS | Status: DC | PRN
  Administered 2012-06-25 (×4): via INTRAVENOUS

## 2012-06-25 MED ORDER — MIDAZOLAM HCL 5 MG/5ML IJ SOLN
INTRAMUSCULAR | Status: DC | PRN
  Administered 2012-06-25: 2 mg via INTRAVENOUS

## 2012-06-25 MED ORDER — HYDROMORPHONE HCL PF 1 MG/ML IJ SOLN
INTRAMUSCULAR | Status: AC
  Filled 2012-06-25: qty 1

## 2012-06-25 MED ORDER — BISACODYL 10 MG RE SUPP: 10.0000 mg | Freq: Every day | RECTAL | Status: DC | PRN

## 2012-06-25 MED ORDER — VECURONIUM BROMIDE 10 MG IV SOLR
INTRAVENOUS | Status: DC | PRN
  Administered 2012-06-25: 3 mg via INTRAVENOUS
  Administered 2012-06-25: 2 mg via INTRAVENOUS
  Administered 2012-06-25: 1 mg via INTRAVENOUS
  Administered 2012-06-25 (×2): 2 mg via INTRAVENOUS
  Administered 2012-06-25: 3 mg via INTRAVENOUS
  Administered 2012-06-25: 1 mg via INTRAVENOUS
  Administered 2012-06-25 (×2): 3 mg via INTRAVENOUS

## 2012-06-25 MED ORDER — METHOCARBAMOL 500 MG PO TABS
500.0000 mg | ORAL_TABLET | Freq: Four times a day (QID) | ORAL | Status: DC | PRN
  Administered 2012-06-26 (×2): 500 mg via ORAL
  Filled 2012-06-25 (×3): qty 1

## 2012-06-25 MED ORDER — GLYCOPYRROLATE 0.2 MG/ML IJ SOLN
INTRAMUSCULAR | Status: DC | PRN
  Administered 2012-06-25: .8 mg via INTRAVENOUS

## 2012-06-25 MED ORDER — SODIUM CHLORIDE 0.9 % IV SOLN
10.0000 mg | INTRAVENOUS | Status: DC | PRN
  Administered 2012-06-25: 40 ug/min via INTRAVENOUS

## 2012-06-25 MED ORDER — DIPHENHYDRAMINE HCL 12.5 MG/5ML PO ELIX: 12.5000 mg | ORAL_SOLUTION | Freq: Four times a day (QID) | ORAL | Status: DC | PRN

## 2012-06-25 MED ORDER — PANTOPRAZOLE SODIUM 40 MG IV SOLR
40.0000 mg | Freq: Every day | INTRAVENOUS | Status: DC
  Administered 2012-06-25: 40 mg via INTRAVENOUS
  Filled 2012-06-25 (×2): qty 40

## 2012-06-25 MED ORDER — SENNOSIDES-DOCUSATE SODIUM 8.6-50 MG PO TABS: 1.0000 | ORAL_TABLET | Freq: Every evening | ORAL | Status: DC | PRN

## 2012-06-25 MED ORDER — DEXAMETHASONE SODIUM PHOSPHATE 10 MG/ML IJ SOLN
INTRAMUSCULAR | Status: DC | PRN
  Administered 2012-06-25: 10 mg via INTRAVENOUS

## 2012-06-25 MED ORDER — KCL IN DEXTROSE-NACL 20-5-0.45 MEQ/L-%-% IV SOLN
INTRAVENOUS | Status: DC
  Administered 2012-06-25: 19:00:00 via INTRAVENOUS
  Filled 2012-06-25 (×3): qty 1000

## 2012-06-25 MED ORDER — BUPIVACAINE-EPINEPHRINE PF 0.25-1:200000 % IJ SOLN
INTRAMUSCULAR | Status: DC | PRN
  Administered 2012-06-25: 6.5 mL

## 2012-06-25 MED ORDER — ONDANSETRON HCL 4 MG/2ML IJ SOLN
INTRAMUSCULAR | Status: DC | PRN
  Administered 2012-06-25: 4 mg via INTRAVENOUS

## 2012-06-25 MED ORDER — ONDANSETRON HCL 4 MG/2ML IJ SOLN: 4.0000 mg | Freq: Four times a day (QID) | INTRAMUSCULAR | Status: DC | PRN

## 2012-06-25 MED ORDER — ROCURONIUM BROMIDE 100 MG/10ML IV SOLN
INTRAVENOUS | Status: DC | PRN
  Administered 2012-06-25: 50 mg via INTRAVENOUS

## 2012-06-25 MED ORDER — HYDROMORPHONE 0.3 MG/ML IV SOLN
INTRAVENOUS | Status: AC
  Filled 2012-06-25: qty 25

## 2012-06-25 MED ORDER — SODIUM CHLORIDE 0.9 % IV SOLN: 250.0000 mL | INTRAVENOUS | Status: DC

## 2012-06-25 MED ORDER — SODIUM CHLORIDE 0.9 % IJ SOLN: 3.0000 mL | INTRAMUSCULAR | Status: DC | PRN

## 2012-06-25 MED ORDER — METHOCARBAMOL 100 MG/ML IJ SOLN
500.0000 mg | Freq: Four times a day (QID) | INTRAVENOUS | Status: DC | PRN
  Administered 2012-06-25: 500 mg via INTRAVENOUS
  Filled 2012-06-25: qty 5

## 2012-06-25 MED ORDER — FLEET ENEMA 7-19 GM/118ML RE ENEM: 1.0000 | ENEMA | Freq: Once | RECTAL | Status: AC | PRN

## 2012-06-25 MED ORDER — LORAZEPAM 0.5 MG PO TABS
0.2500 mg | ORAL_TABLET | Freq: Every day | ORAL | Status: DC
  Administered 2012-06-25: 0.25 mg via ORAL
  Filled 2012-06-25: qty 1

## 2012-06-25 MED ORDER — CEFAZOLIN SODIUM 1-5 GM-% IV SOLN
INTRAVENOUS | Status: AC
  Filled 2012-06-25: qty 100

## 2012-06-25 MED ORDER — ACETAMINOPHEN 650 MG RE SUPP: 650.0000 mg | RECTAL | Status: DC | PRN

## 2012-06-25 MED ORDER — ZOLPIDEM TARTRATE 5 MG PO TABS: 5.0000 mg | ORAL_TABLET | Freq: Every evening | ORAL | Status: DC | PRN

## 2012-06-25 MED ORDER — THROMBIN 20000 UNITS EX SOLR
CUTANEOUS | Status: AC
  Filled 2012-06-25: qty 20000

## 2012-06-25 MED ORDER — MIDAZOLAM HCL 2 MG/2ML IJ SOLN: 1.0000 mg | INTRAMUSCULAR | Status: DC | PRN

## 2012-06-25 MED ORDER — DIPHENHYDRAMINE HCL 50 MG/ML IJ SOLN: 12.5000 mg | Freq: Four times a day (QID) | INTRAMUSCULAR | Status: DC | PRN

## 2012-06-25 MED ORDER — NEOSTIGMINE METHYLSULFATE 1 MG/ML IJ SOLN
INTRAMUSCULAR | Status: DC | PRN
  Administered 2012-06-25: 5 mg via INTRAVENOUS

## 2012-06-25 MED ORDER — 0.9 % SODIUM CHLORIDE (POUR BTL) OPTIME
TOPICAL | Status: DC | PRN
  Administered 2012-06-25: 1000 mL

## 2012-06-25 MED ORDER — ONDANSETRON HCL 4 MG/2ML IJ SOLN: 4.0000 mg | INTRAMUSCULAR | Status: DC | PRN

## 2012-06-25 MED ORDER — MENTHOL 3 MG MT LOZG: 1.0000 | LOZENGE | OROMUCOSAL | Status: DC | PRN

## 2012-06-25 MED ORDER — FENTANYL CITRATE 0.05 MG/ML IJ SOLN: 50.0000 ug | Freq: Once | INTRAMUSCULAR | Status: DC

## 2012-06-25 MED ORDER — OXYCODONE HCL ER 10 MG PO T12A: 10.0000 mg | EXTENDED_RELEASE_TABLET | Freq: Two times a day (BID) | ORAL | Status: DC

## 2012-06-25 MED ORDER — CEFAZOLIN SODIUM 1-5 GM-% IV SOLN
1.0000 g | Freq: Three times a day (TID) | INTRAVENOUS | Status: AC
  Administered 2012-06-25 – 2012-06-26 (×2): 1 g via INTRAVENOUS
  Filled 2012-06-25 (×3): qty 50

## 2012-06-25 MED ORDER — OXYCODONE-ACETAMINOPHEN 5-325 MG PO TABS
1.0000 | ORAL_TABLET | ORAL | Status: DC | PRN
  Administered 2012-06-26: 2 via ORAL
  Filled 2012-06-25: qty 2

## 2012-06-25 MED ORDER — HYDROMORPHONE 0.3 MG/ML IV SOLN
INTRAVENOUS | Status: DC
  Administered 2012-06-25: 16:00:00 via INTRAVENOUS
  Administered 2012-06-25: 0.9 mg via INTRAVENOUS
  Administered 2012-06-26: 4.11 mg via INTRAVENOUS
  Administered 2012-06-26: 1.5 mg via INTRAVENOUS
  Administered 2012-06-26: 09:00:00 via INTRAVENOUS
  Administered 2012-06-26: 1.5 mg via INTRAVENOUS
  Administered 2012-06-26: 2.4 mg via INTRAVENOUS
  Filled 2012-06-25: qty 25

## 2012-06-25 MED ORDER — INFLUENZA VIRUS VACC SPLIT PF IM SUSP
0.5000 mL | INTRAMUSCULAR | Status: AC
  Administered 2012-06-26: 0.5 mL via INTRAMUSCULAR
  Filled 2012-06-25: qty 0.5

## 2012-06-25 MED ORDER — PROPOFOL 10 MG/ML IV BOLUS
INTRAVENOUS | Status: DC | PRN
  Administered 2012-06-25: 180 mg via INTRAVENOUS

## 2012-06-25 MED ORDER — CHLORHEXIDINE GLUCONATE 4 % EX LIQD: 60.0000 mL | Freq: Once | CUTANEOUS | Status: DC

## 2012-06-25 MED ORDER — PROMETHAZINE HCL 25 MG/ML IJ SOLN: 6.2500 mg | INTRAMUSCULAR | Status: DC | PRN

## 2012-06-25 MED ORDER — SODIUM CHLORIDE 0.9 % IJ SOLN: 9.0000 mL | INTRAMUSCULAR | Status: DC | PRN

## 2012-06-25 MED ORDER — DOCUSATE SODIUM 100 MG PO CAPS
100.0000 mg | ORAL_CAPSULE | Freq: Two times a day (BID) | ORAL | Status: DC
  Administered 2012-06-25 – 2012-06-26 (×2): 100 mg via ORAL
  Filled 2012-06-25 (×4): qty 1

## 2012-06-25 MED ORDER — OXYCODONE HCL 5 MG PO TABS: 5.0000 mg | ORAL_TABLET | Freq: Once | ORAL | Status: DC | PRN

## 2012-06-25 MED ORDER — NALOXONE HCL 0.4 MG/ML IJ SOLN: 0.4000 mg | INTRAMUSCULAR | Status: DC | PRN

## 2012-06-25 MED ORDER — PHENOL 1.4 % MT LIQD
1.0000 | OROMUCOSAL | Status: DC | PRN
  Filled 2012-06-25: qty 177

## 2012-06-25 MED ORDER — HYDROMORPHONE HCL PF 1 MG/ML IJ SOLN: 0.2500 mg | INTRAMUSCULAR | Status: DC | PRN

## 2012-06-25 MED ORDER — OXYCODONE HCL 20 MG PO TB12
20.0000 mg | ORAL_TABLET | Freq: Two times a day (BID) | ORAL | Status: DC
  Filled 2012-06-25 (×2): qty 1

## 2012-06-25 MED ORDER — LIDOCAINE HCL (CARDIAC) 20 MG/ML IV SOLN
INTRAVENOUS | Status: DC | PRN
  Administered 2012-06-25: 100 mg via INTRAVENOUS

## 2012-06-25 MED ORDER — FENTANYL CITRATE 0.05 MG/ML IJ SOLN
INTRAMUSCULAR | Status: DC | PRN
  Administered 2012-06-25 (×2): 50 ug via INTRAVENOUS
  Administered 2012-06-25: 100 ug via INTRAVENOUS
  Administered 2012-06-25 (×2): 50 ug via INTRAVENOUS
  Administered 2012-06-25: 100 ug via INTRAVENOUS
  Administered 2012-06-25: 50 ug via INTRAVENOUS

## 2012-06-25 MED ORDER — BUPIVACAINE-EPINEPHRINE PF 0.25-1:200000 % IJ SOLN
INTRAMUSCULAR | Status: AC
  Filled 2012-06-25: qty 30

## 2012-06-25 MED ORDER — ACETAMINOPHEN 325 MG PO TABS: 650.0000 mg | ORAL_TABLET | ORAL | Status: DC | PRN

## 2012-06-25 MED ORDER — OXYCODONE HCL 5 MG/5ML PO SOLN: 5.0000 mg | Freq: Once | ORAL | Status: DC | PRN

## 2012-06-25 MED ORDER — OXYCODONE HCL ER 10 MG PO T12A
20.0000 mg | EXTENDED_RELEASE_TABLET | Freq: Two times a day (BID) | ORAL | Status: DC
  Administered 2012-06-25 – 2012-06-26 (×2): 20 mg via ORAL
  Filled 2012-06-25 (×4): qty 2

## 2012-06-25 MED ORDER — THROMBIN 20000 UNITS EX SOLR
CUTANEOUS | Status: DC | PRN
  Administered 2012-06-25 (×2): via TOPICAL

## 2012-06-25 SURGICAL SUPPLY — 109 items
ADH SKN CLS APL DERMABOND .7 (GAUZE/BANDAGES/DRESSINGS) ×4
BANDAGE ELASTIC 3 VELCRO ST LF (GAUZE/BANDAGES/DRESSINGS) ×3 IMPLANT
BANDAGE GAUZE ELAST BULKY 4 IN (GAUZE/BANDAGES/DRESSINGS) ×3 IMPLANT
BIT DRILL MOUNTAINER FXED 12MM (DRILL) IMPLANT
BLADE SURG 10 STRL SS (BLADE) ×1 IMPLANT
BLADE SURG 15 STRL LF DISP TIS (BLADE) IMPLANT
BLADE SURG 15 STRL SS (BLADE) ×9
BLADE SURG ROTATE 9660 (MISCELLANEOUS) ×1 IMPLANT
BNDG CMPR 9X4 STRL LF SNTH (GAUZE/BANDAGES/DRESSINGS) ×4
BNDG ESMARK 4X9 LF (GAUZE/BANDAGES/DRESSINGS) ×4 IMPLANT
BUR MATCHSTICK NEURO 3.0 LAGG (BURR) ×3 IMPLANT
BUR ROUND FLUTED 4 SOFT TCH (BURR) ×2 IMPLANT
BUR SABER RD CUTTING 3.0 (BURR) ×3 IMPLANT
CANISTER SUCTION 2500CC (MISCELLANEOUS) ×1 IMPLANT
CLOTH BEACON ORANGE TIMEOUT ST (SAFETY) ×3 IMPLANT
COLLAR CERV LO CONTOUR FIRM DE (SOFTGOODS) ×3 IMPLANT
CORDS BIPOLAR (ELECTRODE) ×6 IMPLANT
COVER SURGICAL LIGHT HANDLE (MISCELLANEOUS) ×3 IMPLANT
CUFF TOURNIQUET SINGLE 18IN (TOURNIQUET CUFF) ×4 IMPLANT
CUFF TOURNIQUET SINGLE 24IN (TOURNIQUET CUFF) IMPLANT
DERMABOND ADVANCED (GAUZE/BANDAGES/DRESSINGS) ×2
DERMABOND ADVANCED .7 DNX12 (GAUZE/BANDAGES/DRESSINGS) ×2 IMPLANT
DRAIN TLS ROUND 10FR (DRAIN) ×1 IMPLANT
DRAPE C-ARM 42X72 X-RAY (DRAPES) ×6 IMPLANT
DRAPE EXTREMITY T 121X128X90 (DRAPE) ×1 IMPLANT
DRAPE INCISE IOBAN 66X45 STRL (DRAPES) IMPLANT
DRAPE LAPAROTOMY 100X72X124 (DRAPES) ×1 IMPLANT
DRAPE LAPAROTOMY T 102X78X121 (DRAPES) ×3 IMPLANT
DRAPE MICROSCOPE LEICA (MISCELLANEOUS) ×3 IMPLANT
DRAPE PROXIMA HALF (DRAPES) ×6 IMPLANT
DRAPE SURG 17X23 STRL (DRAPES) ×24 IMPLANT
DRAPE U-SHAPE 47X51 STRL (DRAPES) ×3 IMPLANT
DRILL MOUNTAINEER FIXED 12MM (DRILL) ×3
DRSG EMULSION OIL 3X3 NADH (GAUZE/BANDAGES/DRESSINGS) ×1 IMPLANT
DRSG MEPILEX BORDER 4X4 (GAUZE/BANDAGES/DRESSINGS) IMPLANT
DRSG MEPILEX BORDER 4X8 (GAUZE/BANDAGES/DRESSINGS) ×3 IMPLANT
DRSG PAD ABDOMINAL 8X10 ST (GAUZE/BANDAGES/DRESSINGS) ×1 IMPLANT
DURAPREP 26ML APPLICATOR (WOUND CARE) ×4 IMPLANT
DURAPREP 6ML APPLICATOR 50/CS (WOUND CARE) ×3 IMPLANT
ELECT BLADE 4.0 EZ CLEAN MEGAD (MISCELLANEOUS)
ELECT COATED BLADE 2.86 ST (ELECTRODE) ×3 IMPLANT
ELECT REM PT RETURN 9FT ADLT (ELECTROSURGICAL) ×3
ELECTRODE BLDE 4.0 EZ CLN MEGD (MISCELLANEOUS) IMPLANT
ELECTRODE REM PT RTRN 9FT ADLT (ELECTROSURGICAL) ×2 IMPLANT
EVACUATOR 1/8 PVC DRAIN (DRAIN) IMPLANT
GAUZE XEROFORM 1X8 LF (GAUZE/BANDAGES/DRESSINGS) ×3 IMPLANT
GLOVE BIOGEL PI IND STRL 7.5 (GLOVE) ×2 IMPLANT
GLOVE BIOGEL PI INDICATOR 7.5 (GLOVE) ×2
GLOVE ECLIPSE 7.0 STRL STRAW (GLOVE) ×4 IMPLANT
GLOVE ECLIPSE 8.5 STRL (GLOVE) ×4 IMPLANT
GLOVE SURG 8.5 LATEX PF (GLOVE) ×4 IMPLANT
GOWN PREVENTION PLUS LG XLONG (DISPOSABLE) ×1 IMPLANT
GOWN PREVENTION PLUS XXLARGE (GOWN DISPOSABLE) ×4 IMPLANT
GOWN STRL NON-REIN LRG LVL3 (GOWN DISPOSABLE) ×6 IMPLANT
HEAD HALTER (SOFTGOODS) ×3 IMPLANT
KIT BASIN OR (CUSTOM PROCEDURE TRAY) ×3 IMPLANT
KIT ROOM TURNOVER OR (KITS) ×3 IMPLANT
MANIFOLD NEPTUNE II (INSTRUMENTS) ×2 IMPLANT
MIX DBX 10CC 35% BONE (Bone Implant) ×1 IMPLANT
NDL HYPO 25GX1X1/2 BEV (NEEDLE) ×2 IMPLANT
NDL SPNL 20GX3.5 QUINCKE YW (NEEDLE) ×4 IMPLANT
NEEDLE HYPO 25GX1X1/2 BEV (NEEDLE) ×3 IMPLANT
NEEDLE SPNL 20GX3.5 QUINCKE YW (NEEDLE) ×6 IMPLANT
NS IRRIG 1000ML POUR BTL (IV SOLUTION) ×3 IMPLANT
PACK ORTHO CERVICAL (CUSTOM PROCEDURE TRAY) ×3 IMPLANT
PACK ORTHO EXTREMITY (CUSTOM PROCEDURE TRAY) ×3 IMPLANT
PAD ARMBOARD 7.5X6 YLW CONV (MISCELLANEOUS) ×6 IMPLANT
PAD CAST 4YDX4 CTTN HI CHSV (CAST SUPPLIES) ×2 IMPLANT
PADDING CAST COTTON 4X4 STRL (CAST SUPPLIES) ×3
PATTIES SURGICAL .25X.25 (GAUZE/BANDAGES/DRESSINGS) ×2 IMPLANT
PATTIES SURGICAL .5 X.5 (GAUZE/BANDAGES/DRESSINGS) ×1 IMPLANT
PATTIES SURGICAL .75X.75 (GAUZE/BANDAGES/DRESSINGS) ×3 IMPLANT
PENCIL BUTTON HOLSTER BLD 10FT (ELECTRODE) ×1 IMPLANT
PIN DISTRACTION 14 (PIN) ×2 IMPLANT
PIN DISTRACTION 14MM (PIN) ×4 IMPLANT
ROD MOUNTAINEER 120MM (Rod) ×3 IMPLANT
SCREW F A 3.5X12 (Screw) ×6 IMPLANT
SCREW INNER (Screw) ×8 IMPLANT
SCREW MOUNTAINEER ML 4X18MM (Screw) ×2 IMPLANT
SPONGE GAUZE 4X4 12PLY (GAUZE/BANDAGES/DRESSINGS) ×3 IMPLANT
SPONGE INTESTINAL PEANUT (DISPOSABLE) ×2 IMPLANT
SPONGE LAP 4X18 X RAY DECT (DISPOSABLE) ×8 IMPLANT
SPONGE SURGIFOAM ABS GEL 100 (HEMOSTASIS) ×1 IMPLANT
STRIP CLOSURE SKIN 1/2X4 (GAUZE/BANDAGES/DRESSINGS) ×3 IMPLANT
SUCTION FRAZIER TIP 10 FR DISP (SUCTIONS) ×3 IMPLANT
SUT ETHIBOND CT1 BRD #0 30IN (SUTURE) ×3 IMPLANT
SUT ETHIBOND NAB CT1 #1 30IN (SUTURE) ×1 IMPLANT
SUT ETHILON 4 0 PS 2 18 (SUTURE) ×2 IMPLANT
SUT PROLENE 3 0 PS 2 (SUTURE) ×4 IMPLANT
SUT VIC AB 0 CT1 27 (SUTURE) ×3
SUT VIC AB 0 CT1 27XBRD ANBCTR (SUTURE) IMPLANT
SUT VIC AB 2-0 CT1 27 (SUTURE) ×9
SUT VIC AB 2-0 CT1 TAPERPNT 27 (SUTURE) IMPLANT
SUT VIC AB 2-0 UR6 27 (SUTURE) IMPLANT
SUT VIC AB 3-0 FS2 27 (SUTURE) ×1 IMPLANT
SUT VIC AB 3-0 PS2 18 (SUTURE) ×3
SUT VIC AB 3-0 PS2 18XBRD (SUTURE) ×2 IMPLANT
SUT VIC AB 4-0 PS2 27 (SUTURE) ×1 IMPLANT
SUT VICRYL 0 UR6 27IN ABS (SUTURE) ×3 IMPLANT
SUT VICRYL 4-0 PS2 18IN ABS (SUTURE) ×4 IMPLANT
SYR CONTROL 10ML LL (SYRINGE) ×3 IMPLANT
SYSTEM CHEST DRAIN TLS 7FR (DRAIN) IMPLANT
TOWEL OR 17X24 6PK STRL BLUE (TOWEL DISPOSABLE) ×3 IMPLANT
TOWEL OR 17X26 10 PK STRL BLUE (TOWEL DISPOSABLE) ×3 IMPLANT
TRAY FOLEY CATH 14FR (SET/KITS/TRAYS/PACK) ×1 IMPLANT
TUBE CONNECTING 12X1/4 (SUCTIONS) ×4 IMPLANT
UNDERPAD 30X30 INCONTINENT (UNDERPADS AND DIAPERS) ×3 IMPLANT
WATER STERILE IRR 1000ML POUR (IV SOLUTION) ×2 IMPLANT
YANKAUER SUCT BULB TIP NO VENT (SUCTIONS) ×1 IMPLANT

## 2012-06-25 NOTE — Anesthesia Procedure Notes (Addendum)
Procedure Name: Intubation Date/Time: 06/25/2012 7:52 AM Performed by: Rogelia Boga Pre-anesthesia Checklist: Patient identified, Emergency Drugs available, Suction available, Patient being monitored and Timeout performed Patient Re-evaluated:Patient Re-evaluated prior to inductionOxygen Delivery Method: Circle system utilized Preoxygenation: Pre-oxygenation with 100% oxygen Intubation Type: IV induction Ventilation: Mask ventilation without difficulty Laryngoscope Size: Mac and 4 Grade View: Grade I Tube type: Oral Tube size: 7.5 mm Number of attempts: 1 Airway Equipment and Method: Stylet Placement Confirmation: ETT inserted through vocal cords under direct vision,  positive ETCO2 and breath sounds checked- equal and bilateral Secured at: 21 cm Tube secured with: Tape Dental Injury: Teeth and Oropharynx as per pre-operative assessment

## 2012-06-25 NOTE — H&P (Signed)
Patient examined and H&P reviewed with Vernon PA-C.  

## 2012-06-25 NOTE — Progress Notes (Signed)
Orthopedic Tech Progress Note Patient Details:  Bradley Rice [birth year] [id number]  Patient ID: Bradley Rice, male   DOB: 1961/01/07, 51 y.o.   MRN: [id number]   [name] 06/25/2012, 5:48 PM Brace order fitted by bio-tech vendor Greg at 3:54 pm.

## 2012-06-25 NOTE — Brief Op Note (Signed)
06/25/2012  3:17 PM  PATIENT:  Bradley Rice  51 y.o. 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, C6-7,C7-T1, Left carpal tunnel syndrome and ulnar nerve  entrapment at wrist and [place]  PROCEDURE:  Procedure(s) (LRB) with comments: ANTERIOR CERVICAL DECOMPRESSION/DISCECTOMY FUSION 1 LEVEL/HARDWARE REMOVAL (N/A) - Removal anterior cervical plate I6-N6, Explore Fusion, Left C5-6, C6-7, C7-T1 Re-do Foraminotomy, Left open carpal tunnel release with release of ulnar nerve at [employer], Posterior Cervical Fusion with lateral mass screws and rods C5-T1 CARPAL TUNNEL RELEASE (Left) - as above POSTERIOR CERVICAL FUSION/FORAMINOTOMY LEVEL 1 (N/A) - as above  SURGEON:  Surgeon(s) and Role:    * Kerrin Champagne, MD - Primary    * Eldred Manges, MD - Assisting  PHYSICIAN ASSISTANT: Ermalene Postin  ANESTHESIA:   local, general and Local anesthetic supplement Marcaine quarter percent with 1-200,000 epinephrine total of 27 cc Dr. Diamantina Monks  EBL:  Total I/O In: 3000 [I.V.:3000] Out: 2100 [Urine:1550; Blood:550]  BLOOD ADMINISTERED:none  DRAINS: Urinary Catheter (Foley) and (10 [place]) TLS Drain(s) to suction in the Left anterior neck   LOCAL MEDICATIONS USED:  MARCAINE    and Amount: 27 ml  SPECIMEN:  No Specimen  DISPOSITION OF SPECIMEN:  Anterior cervical plate and 8 screws are given the patient  COUNTS:  YES  TOURNIQUET:   Total Tourniquet Time Documented: Forearm (Left) - 8 minutes  DICTATION: .Reubin Milan Dictation  PLAN OF CARE: Admit to inpatient   PATIENT DISPOSITION:  PACU - hemodynamically stable.   Delay start of Pharmacological VTE agent (>24hrs) due to surgical blood loss or risk of  bleeding: yes

## 2012-06-25 NOTE — Progress Notes (Signed)
Orthopedic Tech Progress Note Patient Details:  Bradley Rice 23-Nov-1960 [id number]  Patient ID: [name], male   DOB: August 01, 1961, 51 y.o.   MRN: [id number]   [name] 06/25/2012, 4:39 PM Called bio-tech for aspen collar at 3:50.

## 2012-06-25 NOTE — Transfer of Care (Signed)
Immediate Anesthesia Transfer of Care Note  Patient: Bradley Rice  Procedure(s) Performed: Procedure(s) (LRB) with comments: ANTERIOR CERVICAL DECOMPRESSION/DISCECTOMY FUSION 1 LEVEL/HARDWARE REMOVAL (N/A) - Removal anterior cervical plate R6-E4, Explore Fusion, Left C5-6, C6-7, C7-T1 Re-do Foraminotomy, Left open carpal tunnel release with release of ulnar nerve at [facility], Posterior Cervical Fusion with lateral mass screws and rods C5-T1 CARPAL TUNNEL RELEASE (Left) - as above POSTERIOR CERVICAL FUSION/FORAMINOTOMY LEVEL 1 (N/A) - as above  Patient Location: PACU  Anesthesia Type:General  Level of Consciousness: awake, alert , oriented and patient cooperative  Airway & Oxygen Therapy: Patient Spontanous Breathing and Patient connected to nasal cannula oxygen  Post-op Assessment: Report given to PACU RN, Post -op Vital signs reviewed and stable and Patient moving all extremities X 4  Post vital signs: Reviewed and stable  Complications: No apparent anesthesia complications

## 2012-06-25 NOTE — Anesthesia Postprocedure Evaluation (Signed)
  Anesthesia Post-op Note  Patient: Bradley Rice  Procedure(s) Performed: Procedure(s) (LRB) with comments: ANTERIOR CERVICAL DECOMPRESSION/DISCECTOMY FUSION 1 LEVEL/HARDWARE REMOVAL (N/A) - Removal anterior cervical plate L2-G4, Explore Fusion, Left C5-6, C6-7, C7-T1 Re-do Foraminotomy, Left open carpal tunnel release with release of ulnar nerve at [facility], Posterior Cervical Fusion with lateral mass screws and rods C5-T1 CARPAL TUNNEL RELEASE (Left) - as above POSTERIOR CERVICAL FUSION/FORAMINOTOMY LEVEL 1 (N/A) - as above  Patient Location: PACU  Anesthesia Type:General  Level of Consciousness: awake and alert   Airway and Oxygen Therapy: Patient Spontanous Breathing  Post-op Pain: mild  Post-op Assessment: Post-op Vital signs reviewed, Patient's Cardiovascular Status Stable, Respiratory Function Stable, Patent Airway, No signs of Nausea or vomiting and Pain level controlled  Post-op Vital Signs: stable  Complications: No apparent anesthesia complications

## 2012-06-25 NOTE — H&P (Signed)
Patient was seen and examined in the preop holding area. There has been no interval  Change in this patient's exam preop  history and physical exam  Lab tests and images have been examined and reviewed.  The Risks benefits and alternative treatments have been discussed  extensively,questions answered.  The patient has elected to undergo the discussed surgical treatment. 

## 2012-06-25 NOTE — Anesthesia Preprocedure Evaluation (Addendum)
Anesthesia Evaluation  Patient identified by MRN, date of birth, ID band Patient awake    Reviewed: Allergy & Precautions, H&P , NPO status , Patient's Chart, lab work & pertinent test results  Airway Mallampati: II TM Distance: >3 FB Neck ROM: Limited    Dental  (+) Dental Advidsory Given   Pulmonary COPD breath sounds clear to auscultation+ rhonchi         Cardiovascular negative cardio ROS  Rhythm:Regular Rate:Normal     Neuro/Psych    GI/Hepatic negative GI ROS, Neg liver ROS, dysphagia   Endo/Other  negative endocrine ROS  Renal/GU negative Renal ROS     Musculoskeletal   Abdominal   Peds  Hematology negative hematology ROS (+)   Anesthesia Other Findings   Reproductive/Obstetrics                           Anesthesia Physical Anesthesia Plan  ASA: II  Anesthesia Plan: General   Post-op Pain Management:    Induction: Intravenous  Airway Management Planned: Oral ETT  Additional Equipment:   Intra-op Plan:   Post-operative Plan: Extubation in OR  Informed Consent: I have reviewed the patients History and Physical, chart, labs and discussed the procedure including the risks, benefits and alternatives for the proposed anesthesia with the patient or authorized representative who has indicated his/her understanding and acceptance.     Plan Discussed with: CRNA, Surgeon and Anesthesiologist  Anesthesia Plan Comments:        Anesthesia Quick Evaluation

## 2012-06-25 NOTE — Preoperative (Signed)
Beta Blockers   Reason not to administer Beta Blockers:Not Applicable 

## 2012-06-26 ENCOUNTER — Encounter (HOSPITAL_COMMUNITY): Payer: Self-pay | Admitting: Specialist

## 2012-06-26 LAB — CBC WITH DIFFERENTIAL/PLATELET
Basophils Absolute: 0 10*3/uL (ref 0.0–0.1)
Basophils Relative: 0 % (ref 0–1)
Eosinophils Absolute: 0.1 10*3/uL (ref 0.0–0.7)
Eosinophils Relative: 0 % (ref 0–5)
MCH: 32 pg (ref 26.0–34.0)
MCHC: 34 g/dL (ref 30.0–36.0)
MCV: 94.1 fL (ref 78.0–100.0)
Neutrophils Relative %: 76 % (ref 43–77)
Platelets: 243 10*3/uL (ref 150–400)
RBC: 3.56 MIL/uL — ABNORMAL LOW (ref 4.22–5.81)
RDW: 13.6 % (ref 11.5–15.5)

## 2012-06-26 LAB — BASIC METABOLIC PANEL
Calcium: 8.6 mg/dL (ref 8.4–10.5)
GFR calc Af Amer: >90 mL/min (ref >90)
GFR calc non Af Amer: >90 mL/min (ref >90)
Potassium: 3.8 mEq/L (ref 3.5–5.1)
Sodium: 138 mEq/L (ref 135–145)

## 2012-06-26 MED ORDER — VARENICLINE TARTRATE 0.5 MG PO TABS
0.5000 mg | ORAL_TABLET | Freq: Every day | ORAL | Status: DC
  Filled 2012-06-26 (×2): qty 1

## 2012-06-26 MED ORDER — OXYCODONE-ACETAMINOPHEN 7.5-325 MG PO TABS
ORAL_TABLET | ORAL | Status: DC
Start: 2012-06-26 — End: 2013-05-11

## 2012-06-26 MED ORDER — OXYCODONE HCL ER 20 MG PO T12A: 20.0000 mg | EXTENDED_RELEASE_TABLET | Freq: Two times a day (BID) | ORAL | Status: DC

## 2012-06-26 MED ORDER — VARENICLINE TARTRATE 0.5 MG PO TABS: 0.5000 mg | ORAL_TABLET | Freq: Every day | ORAL | Status: DC

## 2012-06-26 MED ORDER — METHOCARBAMOL 500 MG PO TABS
500.0000 mg | ORAL_TABLET | Freq: Four times a day (QID) | ORAL | Status: DC | PRN
Start: 2012-06-26 — End: 2013-12-06

## 2012-06-26 NOTE — Progress Notes (Signed)
PT EVALUATION NOTE.     06/26/12 1100  PT Visit Information  Last PT Received On 06/26/12  PT Time Calculation  PT Start Time 1056  PT Stop Time 1120  PT Time Calculation (min) 24 min  Subjective Data  Subjective Agree to PT eval  Precautions  Precautions Cervical  Restrictions  Weight Bearing Restrictions No  Home Living  Lives With Spouse  Available Help at Discharge Family  Type of Home House  Home Access Stairs to enter  Entrance Stairs-Number of Steps 3  Entrance Stairs-Rails None  Home Layout One level  Bathroom Shower/Tub Tub/shower unit;Curtain  Horticulturist, commercial No  Home Adaptive Equipment Straight cane  Prior Function  Level of Independence Independent  Able to Take Stairs? Yes  Driving Yes  Vocation Unemployed  Cognition  Overall Cognitive Status Appears within functional limits for tasks assessed/performed  Arousal/Alertness Awake/alert  Orientation Level Appears intact for tasks assessed  Behavior During Session [facility] for tasks performed  Right Upper Extremity Assessment  RUE ROM/Strength/Tone [facility] for tasks assessed  Left Upper Extremity Assessment  LUE ROM/Strength/Tone Unable to fully assess (Carpal tunnel surgery in L wrist. )  Right Lower Extremity Assessment  RLE ROM/Strength/Tone WFL  Bed Mobility  Bed Mobility Supine to Sit  Supine to Sit 7: Independent;HOB flat  Transfers  Transfers Sit to Stand;Stand to Sit  Sit to Stand 5: Supervision  Stand to Sit 5: Supervision  Details for Transfer Assistance supervision for safety./   Ambulation/Gait  Ambulation/Gait Assistance 5: Supervision  Ambulation Distance (Feet) 150 Feet  Assistive device None  Gait Pattern WFL  Stairs Yes  Stairs Assistance 7: Independent  Stair Management Technique No rails  Number of Stairs 4   Wheelchair Mobility  Wheelchair Mobility No  PT - End of Session  Equipment Utilized During Treatment Cervical collar  Activity Tolerance Patient  tolerated treatment well  Patient left in chair;with call bell/phone within reach;with family/visitor present  Nurse Communication Mobility status  PT Assessment  Clinical Impression Statement Pt is a 51 y/o male s/p cervical surgery and Carpal tunnel release in L wrist.  Pt present with no acute PT needs.  Educated pt on cervical precautions and use of Cervical collar.  MD in room with pt at end of session.   PT Recommendation/Assessment Patent does not need any further PT services  No Skilled PT All education completed  PT Recommendation  Follow Up Recommendations No PT follow up  Equipment Recommended None recommended by PT  Acute Rehab PT Goals  PT Goal Formulation With patient  PT General Charges  $$ ACUTE PT VISIT 1 Procedure  PT Evaluation  $Initial PT Evaluation Tier I 1 Procedure  PT Treatments  $Therapeutic Activity 8-22 mins  $Self Care/Home Management 8-22   Tris Howell L. Auston Halfmann DPT [phone]

## 2012-06-26 NOTE — Op Note (Signed)
06/25/2012  11:39 AM  PATIENT:  Bradley Rice  51 y.o. 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 POSTERIOR CERVICAL FUSION 3 LEVEL, (4 FIXATION POINTS) WITH DEPUY POSTERIOR CERVICAL LATERAL MASS SCREW AND ROD FIXATION, DBX BONE PUTTY 20 CC POSTERIOR CERVICAL FUSION.OR MICROSCOPE.  SURGEON:  Kerrin Champagne, MD / Annell Greening, MD     ASSISTANT:  Maud Deed, PA-C  (Present throughout the anterior cervical and left open carpal tunnel procedures and necessary for completion of procedures in a timely manner)     ANESTHESIA:  General, with local infiltration of the anterior and posterior neck incision sites total of 27 cc Marcaine quarter percent with 1-200,000 epinephrine. Dr. Gypsy Balsam.    COMPLICATIONS:  None.   DRAINS: 10 French TLS drain anterior inferior neck. Urinary Foley catheter to straight drain.  EBL: 500cc    COMPONENTS: Depuy posterior cervical lateral mass screws C5,C6 and C7. Pedicle screws T1 and Rods C5-T1. DBX bone putty 20cc.  PROCEDURE::The patient was met in the holding area, and the appropriate Left cervical C5-6, C6-7 and C7-T1level identified and marked with an "X" and my initials. The patient was then transported to OR and was placed on the operative table in a supine position. The patient was then placed under  general anesthesia without difficulty. The patient  received appropriate preoperative antibiotic prophylaxis 2 grams ancef.  . Nursing staff inserted a Foley catheter under sterile conditionsCervical spine was positioned with a Mayfield horseshoe and 5 pound cervical halter traction. All pressure points well padded and semi-beach chair position. Arm holder both arms. Standard prep with DuraPrep solution the anterior cervical spine chest. Draped in the usual manner. Iodine vi drape was used. Standard timeout protocol was carried out identifying the patient procedure side of the procedure and level. The skin the left neck old incision scar was infiltrated with Marcaine with epinephrine total of 10 cc. . Incision ellipsing the old incision scar along the anterior aspect of the left sternocleidomastoid muscle and carried down to the level of the platysma and then medial to sternocleidomastoid muscle. The interval between the trachea and esophagus medially and the carotid sheath laterally was developed as a Metzenbaum scissors and blunt dissection exposing the anterior aspect of cervical spine at the C5-6, C6-7 and C7-T1 levels. The omohyoid muscle divided. Attention then turned to the C5-6, C6-7 and C7-T1levels The previous vectra cervical plate was palpable just medial to the longus colli muscle. Handheld Cloward retraction of the soft tissues while identifying the interval between the longus colli muscle and the anterior prevertebral fascia. The fascia then elevated over the cervical plate using a key elevator and freer Elevator. The cervical plate screws were each carefully exposed and removed, total of eight screws and the plate lifted free of The anterior cervical spine. Careful evaluation of the fusion segments showed motion present at each of the the 3 levels with Well seated Titanium cages at all three levels. A cobb used to carefully apply pressure  on either side of the disc space to observe if motion was present. Medial border of the longus collie muscles  was carefully elevated bilaterally and self-retaining retractors were introduced the foot of the blade beneath the medial border of the longus colli muscles. Soft tissue overlying the anterior borders of the disc space at C5-6, C6-7 and  C7-T1levels carefully debridement of soft tissue back to bony edges. The anterior vertebral osteophytes were then resected using rongeur. This bone was preserved for later bone grafting purposes.Distraction pins placed first at the C5 level.The C5-6disc space was then first prepared using loupe magnification and headlight with drilling along the left side of the titanium cage within the lateral aspect of the C5-6 disc space using a high-speed bur. A 3 mm bur was used to carefully remove bone over the inferior aspect of the left side of the vertebral body of C5 and superior left side of C6 back to the posterior aspect of the disc space along the left C5-6. Here a microcurette was used to carefully debride the margin of the posterior aspect of the vertebral body of C5 and C6 identify the anterior aspect of the spinal canal. 1 mm and 2 mm Kerrisons then used to carefully debride the bony edges representing the posterior inferior left osteophytes along the lateral aspect of the left side of the posterior aspect of the C5-6 disc space. The uncovertebral joints on the left side was then able to be resected using 1 and 2 mm Kerrison Kerrisons and thinning the bone where necessary using a high-speed bur. Care taken not to penetrate the bony ledge presenting the lateral aspect of the vertebral body to avoid vertebral artery. The loupe magnification and headlight was used for the initial portion of this procedure. Similarly drilling was carried out over the left side of the disc space at the C6-7 and C7-T1 levels similarly removing bone over the inferior aspect of the vertebral body and over the superior aspect of the inferior vertebral body back to the posterior aspect of the disc space on  the left side of the titanium cage present. The operating room microscope was draped sterilely and brought into the field. Under the operating room microscope and posterior aspect of the disc was excised using micro-curettes pituitary rongeur this was carried out first at the C5-6 level and then at C6-7 and finally at C7-T. Posterior lip osteophytes were drilled using a high-speed bur and a carefully resected with 1 and 2 mm Kerrison foraminotomy performed over the left C6, C7 and C8 nerve roots using 1 and 2 mm Kerrisons disc herniation material was noted within the left C8 foramen some of which represented reflected portion of the patient's annulus into the neuroforamen. Following decompression of the  cord at  C 6, C7 and T1 nerve roots irrigation was carried out. Hemostasis obtained using thrombin-soaked Gelfoam. Esophagus examined and the upper cervical level as well as at the lower cervical level and found to be normal. When the operative field appeared to be dry all excess Gelfoam was removed and only a single layer of Gelfoam remained within the foraminotomies sites. A 10 French TLS drain was placed in in the anterior cervical spine exiting out the inferior aspect of the neck below the incision site. This was sewn in place with a 4-0 nylon stitch The incisions were then closed by reapproximating the omohyoid muscle with 2-0 Vicryl sutures, approximating the deep subcutaneous layers the platysma layer with interrupted 2-0 Vicryl suture and the superficial  fascia overlying the sternocleidomastoid muscle with interrupted 3-0 Vicryl sutures. The subcutaneous layers were approximated with interrupted 3-0 Vicryl sutures as were the superficial layers. The skin was closed with a running subcutaneous stitch of 4-0 Vicryl. Skin was approximated with Dermabond. Mepilex bandage was applied.  Drapes were removed. Patient's OR table was then returned to the flat position. At the end of the cervical spine surgery case all  instrument and sponge counts were correct.  The left upper extremity was then prepped using sterile conditions and draped using sterile technique.  Time-out procedure was called and correct tourniquet about the left upper forearm. Using loope magnification and head lamp a 1.5 inch incision curved at the wrist crease with 15 blade scalpel.  Incision through skin and subcutaneous tissue to the volar distal  forearm fascia and  transverse carpal ligament. Fascia then carefully lifted and incised with Stevens scissors inline with the fourth digit. The skin and subcutaneous tissue retracted and the volar fascia divided under direct vision from distal to proximal. A freer elevator then carefully placed between the median nerve and the transverse carpal ligament protecting the  median nerve as the transverse carpal ligament was divided with a 15 blade scalpel in line with the fourth digit. Retracting the distal skin and subcutaneous tissues distally under direct visualization the remaining portions of the transverse carpal ligament were divided with tenotomy scissors again in line with the fourth digit. The palmar fascia was then divided until the traversing superficial palmar arch was identified and preserved intact.  The motor branch of the median nerve was carefully examined and identified intact. Next the ulnar neurovascular bundle identified proximal to the ulna aspect of the transverse carpal ligament. The transverse carpal ligament fibers along the radial side of the bundle were then carefully dissected away from the ulnar neurovascular bundle and divideTourniquet was then released. Bleeding controlled with bipolar electrocautery. The incision was then irrigated with copious amounts of irrigant solution, No active bleeding was present. The incision closed with a single layer skin closure of 4-0 nylon horizontal mattress sutures. Dry dressing of adaptic, 4x4s held in place with sterile webril.  A well  padded volar splint applied with ace wrap.  All instrument counts were correct.  The patient was then transferred to his stretcher and the OR table arranged for a posterior approach to the cervical spine With chest rolls and the padded Mayfield horse shoe.The patient was then transferred to the operating room table prone position Mayfield horseshoe with chest rolls. All pressure points well-padded PAS stockings.Time-out procedure was called and correct.   Sterile prep with DuraPrep and draped in the usual manner the shoulders were taped downwards and skin traction over the skin of the neck. Following DuraPrep draped in the usual manner. After timeout protocol incision was made approximately T2 to C4 in the midline. This following infiltration of skin and subcutaneous layers with lidocaine 1% with 1-200,000 epinephrine total of 10 cc. Incision carried through skin and subcutaneous layers using 10 blade scalpel and electrocautery down to the level ligamentum nuchae. Incision made centered on the spinous process of T1-C4 Clamps then placed at the spinous process of C 5 and C4-5 interspace intraoperative C-arm fluoroscopy identified the clamps at the C5 and C4-5 levels. A single 0 Vicryl suture was used to mark the spinous process of C 5. Electrocautery then used to carefully incise the cervical muscles off the bilateral lateral aspect of the spinous process of C5, C6, C7 and T1. Carefully removing spinous  muscles off of the inferior aspect lamina at C 5 through T1 exposing the posterior aspect of the interlaminar spaces. The loupe magnification and headlamp were used during this portion procedure. Boss McCollough retractor was inserted. Careful exposure carried out to the lateral aspect of the lateral mass is from C5-T1 bilaterally. Careful hemostasis using bipolar electrocautery and electrocautery unit as needed. The Viper retractor was then inserted. First lateral mass screw was placed on the left side at the  C5 level using a point just superior and medial to the center point of the lateral mass. Then directing a 10 mm positive stop drill 25 cephalad and 30 anterior lateral. The drill hole was then tested using a ball-tipped probe and then 12 mm screw placed following tapping with a 3.5 tap. Similarly drill holes were placed then at C6-C7 levels using the same 25 cephalad angle and a lateral 30 angulation or placement of holes checking each with ball-tip probe to ensure patency and no sign of broaching of cortex. Then placing 12 mm screws at the C6 and C7 levels. The left T1 pedicle screw was placed in a fashion utilizing the mid point of the transverse process of T1 and directing the drill 35 of convergence and about 10 caudally. First a drill hole was placed using a 10 mm drill and then a hand-held pedicle probe used to probe the pedicle to 18 mm. An 18 mm screw was chosen and this was then placed after tapping the hole with a 3.5 tap. This obtained excellent purchase of T1 level. Attention then turned to the right side were similarly C5-C6 and C7 to mass screws were placed using the point just superior to the medial to the center point of the lateral mass at each level and to initiate the placement of the screws and directing it cephalad about 25 and lateral 30. Each of these screw holes were placed and checked for patency in order to determine if there is any broaching of cortex that was present. 12 mm lateral mass screws were then placed at C5-7 right side area similarly on the right side of the T1-1 pedicle screw was placed at the midpoint of the lateral process of T1 directed 35 of convergence and about 10 caudally. 10 mm drill used to drill the initial hole and then a hand-held pedicle probe used to probe the pedicle and 18 mm screw then placed on the right side at T1 within its pedicle. Excellent purchase again obtained. Screws of the left C6 level was removed as it was lateral and was some comminution  lateral portion of the lateral mass and this was removed. Similarly the screw on the right side shows some lateral positioning and some comminution the lateral aspect lateral mass at C6 the screw was a be repositioned for medial and directed more lateral provided excellent fixation C6 on the right side. This point rod and the appropriate length and then determine the cut and carefully contoured to fit within the lateral mass screws on the right side. She is unable was placed and then each of the fixation screw caps placed on the right at T1 C7 C6 and C5 rod and felt to fit quite nicely and these were then tightened using the torque wrench provided. Left side rod was then placed after carefully contouring able to be reduced fasteners T1-C7 and C5 with ease seated and screw heads were then carefully tightened and torqued to the appropriate torque using the torque  screwdriver and counter  torque handle.  High-speed bur was then used to decorticate the posterior aspect of the posterior elements from C5-T1 and along the inferior aspect of each joint and C5-6 C6-7 and C7-T1. 20 cc of DBX putty and brought into the field and then this was carefully applied on the decorticated portions of the posterior aspect of the elements extending from C5 toT1 bilaterally. DBX demineralized bone matrix was also applied on the left side after irrigation. Throughout the case C-arm fluoroscopy was used to help determine the position alignment of the lateral mass screws and the pedicle screw placement. Final C-arm views in AP lateral like position were obtained for documentation purposes. The incision was closed by approximating the ligamentum nuchae with #1 Ethibond sutures. The subcutaneous layers approximated with interrupted 0 Vicryl suture more superficial layers with interrupted 2-0 Vicryl sutures and the skin closed with interrupted 4-0 Vicryl sutures. Dermabond was applied then [employer] bandage. Soft cervical collar all instrument  and sponge counts were correct.  [place] collar was then applied. Patient was then returned to supine position on her stretcher. Returned to recovery room in satisfactory condition. all instrument and sponge counts were correct. Dr. Annell Greening perform the duties of assistant surgeon during the posterior cervical lateral mass screw and rod fixation from C5-T1 and posterior cervical fusion C5-T1.    Physician assistant's responsibilities: Maud Deed PA-C perform the duties of assistant physician and surgeon during the anterior cervical portion of the case present from the beginning of the case to the end of the anterior cervical portion and the left open carpal tunnel release portion of the  case. She assisted with careful retraction of neural structures suctioning about her elements including cervical cord and C 5 through C8  nerve roots. Performed closure of the incision on the anterior cervical spine  to the skin and application of dressing. She assisted in positioning the patient had removal the patient from the OR table to her stretcher.        NITKA,JAMES E  06/26/2012, 11:39 AM

## 2012-06-26 NOTE — Progress Notes (Signed)
Patient discharged with instructions. Education completed with patient and significant other. All questions answered. Prescriptions given to patient. Patient wheelchaired to car by volunteer services.

## 2012-06-26 NOTE — Progress Notes (Signed)
Subjective: 1 Day Post-Op Procedure(s) (LRB): ANTERIOR CERVICAL DECOMPRESSION/DISCECTOMY FUSION 1 LEVEL/HARDWARE REMOVAL (N/A) CARPAL TUNNEL RELEASE (Left) POSTERIOR CERVICAL FUSION/FORAMINOTOMY LEVEL 1 (N/A) I feel good, I'm able to eat without any trouble. Left arm pain is relieved. Patient reports pain as moderate.    Objective:   VITALS:  Temp:  [97.6 F (36.4 C)-99.2 F (37.3 C)] 98.6 F (37 C) (11/26 0546) Pulse Rate:  [105-130] 111  (11/26 0546) Resp:  [12-20] 18  (11/26 0546) BP: (92-121)/(53-81) 92/53 mmHg (11/26 0546) SpO2:  [93 %-99 %] 95 % (11/26 0546)  Neurologically intact ABD soft Neurovascular intact Sensation intact distally Intact pulses distally Dorsiflexion/Plantar flexion intact Incision: dressing C/D/I and moderate drainage No cellulitis present posterior neck incision dressing dry, anterior neck with moderate drainage. Drain anterior neck TLS removed, suture removed.  LABS  Basename 06/26/12 0625  HGB 11.4*  WBC 22.1*  PLT 243    Basename 06/26/12 0625  NA 138  K 3.8  CL 102  CO2 27  BUN 7  CREATININE 0.70  GLUCOSE 122*   No results found for this basename: LABPT:2,INR:2 in the last 72 hours   Assessment/Plan: 1 Day Post-Op Procedure(s) (LRB): ANTERIOR CERVICAL DECOMPRESSION/DISCECTOMY FUSION 1 LEVEL/HARDWARE REMOVAL (N/A) CARPAL TUNNEL RELEASE (Left) POSTERIOR CERVICAL FUSION/FORAMINOTOMY LEVEL 1 (N/A)  Advance diet Up with therapy D/C IV fluids Discharge home with home health  Lanyla Costello E 06/26/2012, 10:54 AM

## 2012-06-26 NOTE — Progress Notes (Signed)
UR COMPLETED  

## 2012-07-03 NOTE — Discharge Summary (Signed)
Patient H&P reviewed with Dondra Spry.

## 2012-07-03 NOTE — Discharge Summary (Signed)
Physician Discharge Summary  Patient ID: Bradley Rice MRN: [id number] DOB/AGE: 12-16-1960 51 y.o.  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 Back pain   . Spider bite 2012  . COPD (chronic obstructive pulmonary disease)   . Dysphagia     patient reports d/t surgery, has modified diet, has to tilt head to swallow     Surgeries: Procedure(s): ANTERIOR CERVICAL DECOMPRESSION/DISCECTOMY FUSION , C5-6,C6-7 and C7-T1, 3 LEVEL/HARDWARE REMOVAL  Left CARPAL TUNNEL RELEASE with release of ulnar nerve at Guyon's canal. POSTERIOR CERVICAL FUSION/FORAMINOTOMY 3 LEVEL with 4 fixation point using DEPUY posterior cervical lateral mass screw and rod fixation, DBX bone putty  on 06/25/2012   Consultants (if any):  none  Discharged Condition: Improved  Hospital Course: Bradley Rice is an 51 y.o. male who was admitted 06/25/2012 with a diagnosis of Failed cervical fusion and went to the operating room on 06/25/2012 and underwent the above named procedures.  His left arm pain and dysphagia were relieved significantly post op. Pt tolerated po analgesics at discharge. He was taking a regular diet also.  He was given perioperative antibiotics:  Anti-infectives     Start     Dose/Rate Route Frequency Ordered Stop   06/25/12 2000   ceFAZolin (ANCEF) IVPB 1 g/50 mL premix        1 g 100 mL/hr over 30 Minutes Intravenous Every 8 hours 06/25/12 1716 06/26/12 0514   06/24/12 1336   ceFAZolin (ANCEF) IVPB 2 g/50 mL premix        2 g 100 mL/hr over 30 Minutes Intravenous 60 min pre-op 06/24/12 1336 06/25/12 1200         .  He was given sequential compression devices, early ambulation for DVT prophylaxis.  He benefited maximally from the hospital stay and there were no complications.    Recent vital signs:  Filed Vitals:   06/26/12 0546  BP: 92/53  Pulse: 111  Temp: 98.6 F (37 C)  Resp: 18    Recent laboratory studies:  Lab Results  Component Value Date   HGB 11.4* 06/26/2012   HGB 14.6 06/20/2012   HGB 14.6 12/20/2011   Lab Results  Component Value Date   WBC 22.1* 06/26/2012   PLT 243 06/26/2012   No results found for this basename: INR   Lab Results  Component Value Date   NA 138 06/26/2012   K 3.8 06/26/2012   CL 102 06/26/2012   CO2 27 06/26/2012   BUN 7 06/26/2012   CREATININE 0.70 06/26/2012   GLUCOSE 122* 06/26/2012    Discharge Medications:     Medication List     As of 07/03/2012 12:05 PM    STOP taking these medications         oxyCODONE-acetaminophen 5-325 MG per tablet   Commonly known as: PERCOCET/ROXICET      TAKE these medications         methocarbamol 500 MG tablet   Commonly known as: ROBAXIN   Take 1 tablet (500 mg total) by mouth every 6 (six) hours as needed.      methocarbamol 500 MG tablet   Commonly known as: ROBAXIN  Take 500 mg by mouth every 6 (six) hours as needed. For muscle spasms      OxyCODONE 20 mg T12a   Commonly known as: OXYCONTIN   Take 1 tablet (20 mg total) by mouth every 12 (twelve) hours.      oxyCODONE 20 MG 12 hr tablet   Commonly known as: OXYCONTIN   Take 20 mg by mouth every 12 (twelve) hours.      oxyCODONE-acetaminophen 7.5-325 MG per tablet   Commonly known as: PERCOCET   1-2 tabs q 4-6 hrs as needed for severe breakthru pain      varenicline 0.5 MG tablet   Commonly known as: CHANTIX   Take 1 tablet (0.5 mg total) by mouth daily.      VITAMIN B-12 PO   Take 1,200 mcg by mouth daily.        Diagnostic Studies: Dg Chest 2 View  06/20/2012  *RADIOLOGY REPORT*  Clinical Data: Tobacco use,  scheduled cervical surgery  CHEST - 2 VIEW  Comparison: 06/02/2008  Findings: Cervical fixation hardware partially seen.  Heart size upper limits normal.  Mild perihilar and bibasilar interstitial prominence as before.  No confluent airspace infiltrate.  No effusion.  IMPRESSION:  1.  Chronic perihilar and bibasilar interstitial changes without acute or superimposed abnormality.   Original Report Authenticated By: D. Andria Rhein, MD    Dg Cervical Spine 2-3 Views  06/25/2012  *RADIOLOGY REPORT*  Clinical Data: Postop.  CERVICAL SPINE - 2-3 VIEW  Comparison: CT 03/14/2012  Findings: Two views of the cervical spine were obtained.  The anterior cervical plate has been removed.  Again noted are interbody devices at C5-C6, C6-C7 and C7-T1. There are now posterior screws and rods from C5-T1.  All of the fused levels have a posterior screw with exception of the left side of C6. There is prevertebral soft tissue swelling which is expected.  Evidence for a surgical drain.  IMPRESSION: Removal of the anterior cervical plate.  Posterior cervical fusion at C5-T1.   Original Report Authenticated By: Richarda Overlie, M.D.    Dg Cervical Spine Complete  06/25/2012  *RADIOLOGY REPORT*  Clinical Data: Posterior cervical fusion  CERVICAL SPINE - COMPLETE 4+ VIEW,DG C-ARM GT 120 MIN  Comparison: Cervical spine CT - 03/14/2012  Findings: Five spot intraoperative radiographic images of the cervical spine are provided for review.  C1 to the superior endplate of C5 is visualized on the lateral radiograph.  There is suboptimal visualization of the inferior aspect of the cervical spine as well as the cervical thoracic junction secondary to overlying osseous to soft tissue structures.  The patient has undergone a removal of previously noted ACDF hardware extending from C5-T1.  Post bilateral paraspinal rod fusion extending from C5-T1.  Pedicle screws are seen bilaterally at all levels except the left-sided C6 pedicle.  Grossly unchanged  positioning of the intervertebral disc space replacements at C5 - C6, C6-C7 and C7 - T1.  An endotracheal tube overlies tracheal air column with tip excluded from view.  Enteric tube tip is excluded from view.  Additional radiopaque surgical instruments overlying the superior aspect of the thorax on the provided AP radiographs.  No definite radiopaque foreign body.  IMPRESSION:  Post removal of previously noted ACDF hardware with subsequent bilateral paraspinal rod fusion extending from C5-T1 as detailed above.   Original Report Authenticated By: Tacey Ruiz, MD    Dg C-arm Gt 120 Min  06/25/2012  *RADIOLOGY REPORT*  Clinical Data: Posterior cervical fusion  CERVICAL SPINE - COMPLETE 4+ VIEW,DG C-ARM GT 120 MIN  Comparison: Cervical spine CT - 03/14/2012  Findings: Five spot intraoperative radiographic images of the cervical spine are provided for review.  C1 to the superior endplate of C5 is visualized on the lateral radiograph.  There is suboptimal visualization of the inferior aspect of the cervical spine as well as the cervical thoracic junction secondary to overlying osseous to soft tissue structures.  The patient has undergone a removal of previously noted ACDF hardware extending from C5-T1.  Post bilateral paraspinal rod fusion extending from C5-T1.  Pedicle screws are seen bilaterally at all levels except the left-sided C6 pedicle.  Grossly unchanged positioning of the intervertebral disc space replacements at C5 - C6, C6-C7 and C7 - T1.  An endotracheal tube overlies tracheal air column with tip excluded from view.  Enteric tube tip is excluded from view.  Additional radiopaque surgical instruments overlying the superior aspect of the thorax on the provided AP radiographs.  No definite radiopaque foreign body.  IMPRESSION:  Post removal of previously noted ACDF hardware with subsequent bilateral paraspinal rod fusion extending from C5-T1 as detailed above.   Original Report Authenticated By: Tacey Ruiz, MD     Disposition: 01-Home or Self Care      Discharge Orders    Future Orders Please Complete By Expires   Diet - low sodium heart healthy      Call MD / Call 911      Comments:   If you experience chest pain or shortness of breath, CALL 911 and be transported to the hospital emergency room.  If you develope a fever above 101 F, pus (white drainage) or increased drainage or redness at the wound, or calf pain, call your surgeon's office.   Constipation Prevention      Comments:   Drink plenty of fluids.  Prune juice may be helpful.  You may use a stool softener, such as Colace (over the counter) 100 mg twice a day.  Use MiraLax (over the counter) for constipation as needed.   Increase activity slowly as tolerated      Discharge instructions      Comments:   No lifting greater than 10 lbs. No overhead use of arms. Avoid bending,and twisting neck. No overhead use of arms. Walk in house for first week them may start to get out slowly increasing distance up to one mile by 3 weeks post op. Keep incision dry for 3 days, may then bathe and wet incision using a [place] collar when showering. Do not get incision wet if there is any drainage. Call if any fevers >101, chills, or increasing numbness or weakness or increased swelling or drainage. Keep incision of left hand dry and clean.  Cover for showering.  Wear splint at all times.  Keep elevated above heart when at rest. Move fingers frequently. Ice packs may be used to neck or wrist incision as needed for pain.  Do not apply heat to neck.   Lifting restrictions      Comments:   No lifting          Pt was given handouts, RX for Chantix, and instruction for smoking cessation.  Follow-up Information    Follow up with NITKA,JAMES E, MD. Schedule an appointment as soon as possible for a visit in 2 weeks.   Contact information:   [address] [place] [address] [phone]           Signed:  Jakevious Hollister  M 07/03/2012, 12:05 PM

## 2012-08-01 DIAGNOSIS — Z9289 Personal history of other medical treatment: Secondary | ICD-10-CM

## 2012-08-01 HISTORY — DX: Personal history of other medical treatment: Z92.89

## 2012-09-10 ENCOUNTER — Other Ambulatory Visit: Payer: Self-pay | Admitting: Specialist

## 2012-09-10 DIAGNOSIS — M542 Cervicalgia: Secondary | ICD-10-CM

## 2012-09-12 ENCOUNTER — Ambulatory Visit
Admission: RE | Admit: 2012-09-12 | Discharge: 2012-09-12 | Disposition: A | Discharge status: Home / Self Care | Payer: BC Managed Care – PPO | Source: Ambulatory Visit | Attending: Specialist | Admitting: Specialist

## 2012-09-12 DIAGNOSIS — M542 Cervicalgia: Secondary | ICD-10-CM

## 2012-11-26 ENCOUNTER — Other Ambulatory Visit: Payer: Self-pay | Admitting: Specialist

## 2012-11-26 DIAGNOSIS — M545 Low back pain: Secondary | ICD-10-CM

## 2012-11-26 DIAGNOSIS — M542 Cervicalgia: Secondary | ICD-10-CM

## 2012-11-26 DIAGNOSIS — M79605 Pain in left leg: Secondary | ICD-10-CM

## 2012-11-30 ENCOUNTER — Other Ambulatory Visit: Payer: BC Managed Care – PPO

## 2012-11-30 ENCOUNTER — Ambulatory Visit
Admission: RE | Admit: 2012-11-30 | Discharge: 2012-11-30 | Disposition: A | Discharge status: Home / Self Care | Payer: BC Managed Care – PPO | Source: Ambulatory Visit | Attending: Specialist | Admitting: Specialist

## 2012-11-30 DIAGNOSIS — M545 Low back pain, unspecified: Secondary | ICD-10-CM

## 2012-11-30 DIAGNOSIS — M542 Cervicalgia: Secondary | ICD-10-CM

## 2012-11-30 DIAGNOSIS — M79605 Pain in left leg: Secondary | ICD-10-CM

## 2012-12-05 ENCOUNTER — Other Ambulatory Visit: Payer: BC Managed Care – PPO

## 2012-12-05 ENCOUNTER — Inpatient Hospital Stay
Admission: RE | Admit: 2012-12-05 | Discharge: 2012-12-05 | Disposition: A | Discharge status: Home / Self Care | Payer: BC Managed Care – PPO | Source: Ambulatory Visit | Attending: Specialist | Admitting: Specialist

## 2012-12-11 ENCOUNTER — Ambulatory Visit
Admission: RE | Admit: 2012-12-11 | Discharge: 2012-12-11 | Disposition: A | Discharge status: Home / Self Care | Payer: BC Managed Care – PPO | Source: Ambulatory Visit | Attending: Specialist | Admitting: Specialist

## 2012-12-11 VITALS — BP 97/56 | HR 57

## 2012-12-11 DIAGNOSIS — M47819 Spondylosis without myelopathy or radiculopathy, site unspecified: Secondary | ICD-10-CM

## 2012-12-11 MED ORDER — DIAZEPAM 5 MG PO TABS
10.0000 mg | ORAL_TABLET | Freq: Once | ORAL | Status: AC
  Administered 2012-12-11: 10 mg via ORAL

## 2012-12-11 MED ORDER — IOHEXOL 300 MG/ML  SOLN
10.0000 mL | Freq: Once | INTRAMUSCULAR | Status: AC | PRN
  Administered 2012-12-11: 10 mL via INTRATHECAL

## 2012-12-11 MED ORDER — ONDANSETRON HCL 4 MG/2ML IJ SOLN
4.0000 mg | Freq: Once | INTRAMUSCULAR | Status: AC
  Administered 2012-12-11: 4 mg via INTRAMUSCULAR

## 2012-12-11 MED ORDER — HYDROMORPHONE HCL PF 2 MG/ML IJ SOLN
2.0000 mg | Freq: Once | INTRAMUSCULAR | Status: AC
  Administered 2012-12-11: 2 mg via INTRAMUSCULAR

## 2012-12-11 NOTE — Progress Notes (Signed)
Pt states he has been off his amitriptyline for past 2 days.

## 2013-03-15 ENCOUNTER — Emergency Department (HOSPITAL_COMMUNITY): Payer: BC Managed Care – PPO

## 2013-03-15 ENCOUNTER — Emergency Department (HOSPITAL_COMMUNITY)
Admission: EM | Admit: 2013-03-15 | Discharge: 2013-03-16 | Disposition: A | Discharge status: Home / Self Care | Payer: BC Managed Care – PPO | Attending: Emergency Medicine | Admitting: Emergency Medicine

## 2013-03-15 ENCOUNTER — Encounter (HOSPITAL_COMMUNITY): Payer: Self-pay

## 2013-03-15 DIAGNOSIS — R05 Cough: Secondary | ICD-10-CM | POA: Insufficient documentation

## 2013-03-15 DIAGNOSIS — W102XXA Fall (on)(from) incline, initial encounter: Secondary | ICD-10-CM

## 2013-03-15 DIAGNOSIS — M549 Dorsalgia, unspecified: Secondary | ICD-10-CM | POA: Insufficient documentation

## 2013-03-15 DIAGNOSIS — W1809XA Striking against other object with subsequent fall, initial encounter: Secondary | ICD-10-CM | POA: Insufficient documentation

## 2013-03-15 DIAGNOSIS — F17200 Nicotine dependence, unspecified, uncomplicated: Secondary | ICD-10-CM | POA: Insufficient documentation

## 2013-03-15 DIAGNOSIS — Z79899 Other long term (current) drug therapy: Secondary | ICD-10-CM | POA: Insufficient documentation

## 2013-03-15 DIAGNOSIS — J029 Acute pharyngitis, unspecified: Secondary | ICD-10-CM | POA: Insufficient documentation

## 2013-03-15 DIAGNOSIS — J40 Bronchitis, not specified as acute or chronic: Secondary | ICD-10-CM | POA: Insufficient documentation

## 2013-03-15 DIAGNOSIS — Y929 Unspecified place or not applicable: Secondary | ICD-10-CM | POA: Insufficient documentation

## 2013-03-15 DIAGNOSIS — J449 Chronic obstructive pulmonary disease, unspecified: Secondary | ICD-10-CM | POA: Insufficient documentation

## 2013-03-15 DIAGNOSIS — J4489 Other specified chronic obstructive pulmonary disease: Secondary | ICD-10-CM | POA: Insufficient documentation

## 2013-03-15 DIAGNOSIS — R059 Cough, unspecified: Secondary | ICD-10-CM | POA: Insufficient documentation

## 2013-03-15 DIAGNOSIS — S1093XA Contusion of unspecified part of neck, initial encounter: Secondary | ICD-10-CM | POA: Insufficient documentation

## 2013-03-15 DIAGNOSIS — S0003XA Contusion of scalp, initial encounter: Secondary | ICD-10-CM | POA: Insufficient documentation

## 2013-03-15 DIAGNOSIS — W108XXA Fall (on) (from) other stairs and steps, initial encounter: Secondary | ICD-10-CM | POA: Insufficient documentation

## 2013-03-15 DIAGNOSIS — G8929 Other chronic pain: Secondary | ICD-10-CM | POA: Insufficient documentation

## 2013-03-15 DIAGNOSIS — Y9389 Activity, other specified: Secondary | ICD-10-CM | POA: Insufficient documentation

## 2013-03-15 DIAGNOSIS — Z9889 Other specified postprocedural states: Secondary | ICD-10-CM | POA: Insufficient documentation

## 2013-03-15 NOTE — ED Provider Notes (Signed)
CSN: [id number]     Arrival date & time 03/15/13  2242 History  This chart was scribed for Benny Lennert, MD by Shari Heritage, ED Scribe. The patient was seen in room APA03/APA03. Patient's care was started at 2314.   First MD Initiated Contact with Patient 03/15/13 2314     Chief Complaint  Patient presents with  . Neck Injury  . Sore Throat  . Cough   Patient is a 52 y.o. male presenting with neck injury. The history is provided by the patient. No language interpreter was used.  Neck Injury This is a new problem. The current episode started 1 to 2 hours ago. The problem occurs constantly. The problem has not changed since onset.Pertinent negatives include no chest pain, no abdominal pain and no headaches. Exacerbated by: movement. Treatments tried: immobilization.   HPI Comments: Bradley Rice is a 52 y.o. male with history of COPD, dysphagia secondary to discectomy, and chronic back pain who presents to the Emergency Department complaining of constant, moderate to severe, posterior neck pain that began 1-2 hours ago. Pain is worse with movement. Patient is also complaining of an intermittent, moderate, nonproductive cough. He states that he injured his neck causing his current pain when he coughed so much that he lost his balance, fell down several steps and hit his head. He also reports sore throat. He denies loss of consciousness or any other symptoms at this time.    Past Medical History  Diagnosis Date  . Back pain   . Spider bite 2012  . COPD (chronic obstructive pulmonary disease)   . Dysphagia     patient reports d/t surgery, has modified diet, has to tilt head to swallow    Past Surgical History  Procedure Laterality Date  . Back surgery    . Lumbar fusion  2000  . Cholecystectomy  2009  . Anterior cervical decomp/discectomy fusion  12/22/2011    Procedure: ANTERIOR CERVICAL DECOMPRESSION/DISCECTOMY FUSION 3 LEVELS;  Surgeon: Venita Lick, MD;  Location: [facility] OR;  Service:  Orthopedics;  Laterality: N/A;  ACDF C5-T1  . Anterior cervical decomp/discectomy fusion  06/25/2012    Procedure: ANTERIOR CERVICAL DECOMPRESSION/DISCECTOMY FUSION 1 LEVEL/HARDWARE REMOVAL;  Surgeon: Kerrin Champagne, MD;  Location: [facility] OR;  Service: Orthopedics;  Laterality: N/A;  Removal anterior cervical plate W0-J8, Explore Fusion, Left C5-6, C6-7, C7-T1 Re-do Foraminotomy, Left open carpal tunnel release with release of ulnar nerve at [facility], Posterior Cervical Fusion with lateral mass screw  . Carpal tunnel release  06/25/2012    Procedure: CARPAL TUNNEL RELEASE;  Surgeon: Kerrin Champagne, MD;  Location: [facility] OR;  Service: Orthopedics;  Laterality: Left;  as above  . Posterior cervical fusion/foraminotomy  06/25/2012    Procedure: POSTERIOR CERVICAL FUSION/FORAMINOTOMY LEVEL 1;  Surgeon: Kerrin Champagne, MD;  Location: [facility] OR;  Service: Orthopedics;  Laterality: N/A;  as above   Family History  Problem Relation Age of Onset  . Anesthesia problems Neg Hx    History  Substance Use Topics  . Smoking status: Current Every Day Smoker -- 1.00 packs/day for 30 years    Types: Cigarettes  . Smokeless tobacco: Never Used  . Alcohol Use: No    Review of Systems  Constitutional: Negative for appetite change and fatigue.  HENT: Positive for sore throat and neck pain. Negative for congestion, sinus pressure and ear discharge.   Eyes: Negative for discharge.  Respiratory: Positive for cough.   Cardiovascular: Negative for chest pain.  Gastrointestinal:  Negative for abdominal pain and diarrhea.  Genitourinary: Negative for frequency and hematuria.  Musculoskeletal: Negative for back pain.  Skin: Negative for rash.  Neurological: Negative for seizures and headaches.  Psychiatric/Behavioral: Negative for hallucinations.    Allergies  Review of patient's allergies indicates no known allergies.  Home Medications   Current Outpatient Rx  Name  Route  Sig  Dispense  Refill  . clonazePAM  (KLONOPIN) 0.5 MG tablet   Oral   Take 0.5 mg by mouth 2 (two) times daily as needed for anxiety.         . methocarbamol (ROBAXIN) 500 MG tablet   Oral   Take 1 tablet (500 mg total) by mouth every 6 (six) hours as needed.   60 tablet   0   . nortriptyline (PAMELOR) 10 MG capsule   Oral   Take 10 mg by mouth at bedtime.         . OxyCODONE (OXYCONTIN) 20 mg T12A   Oral   Take 1 tablet (20 mg total) by mouth every 12 (twelve) hours.   15 tablet   0   . oxyCODONE-acetaminophen (PERCOCET) 7.5-325 MG per tablet      1-2 tabs q 4-6 hrs as needed for severe breakthru pain   60 tablet   0    Triage Vitals: BP 128/91  Pulse 98  Temp(Src) 98.7 F (37.1 C) (Oral)  Resp 18  Ht 6' (1.829 m)  Wt 179 lb (81.194 kg)  BMI 24.27 kg/m2  SpO2 98% Physical Exam  Constitutional: He is oriented to person, place, and time. He appears well-developed.  HENT:  Head: Normocephalic.  Eyes: Conjunctivae and EOM are normal. No scleral icterus.  Neck: No thyromegaly present.  Well healed scar to center of the posterior neck. Tender along length of posterior neck.   Cardiovascular: Normal rate and regular rhythm.  Exam reveals no gallop and no friction rub.   No murmur heard. Pulmonary/Chest: No stridor. He has no wheezes. He has no rales. He exhibits no tenderness.  Abdominal: He exhibits no distension. There is no tenderness. There is no rebound.  Musculoskeletal: Normal range of motion. He exhibits tenderness. He exhibits no edema.  Lymphadenopathy:    He has no cervical adenopathy.  Neurological: He is oriented to person, place, and time. Coordination normal.  Skin: No rash noted. No erythema.  Psychiatric: He has a normal mood and affect. His behavior is normal.    ED Course   Procedures (including critical care time) DIAGNOSTIC STUDIES: Oxygen Saturation is 98% on room air, normal by my interpretation.    COORDINATION OF CARE: 11:18 PM- Patient informed of current plan for  treatment and evaluation and agrees with plan at this time.    Labs Reviewed - No data to display No results found. No diagnosis found.  MDM       The chart was scribed for me under my direct supervision.  I personally performed the history, physical, and medical decision making and all procedures in the evaluation of this patient.Benny Lennert, MD 03/16/13 0110

## 2013-03-15 NOTE — ED Notes (Signed)
Coughed so much that I fell and hit the back of my head causing my neck to hurt per pt.

## 2013-03-16 MED ORDER — OXYCODONE-ACETAMINOPHEN 5-325 MG PO TABS: 1.0000 | ORAL_TABLET | ORAL | Status: DC | PRN

## 2013-03-16 MED ORDER — OXYCODONE-ACETAMINOPHEN 5-325 MG PO TABS
2.0000 | ORAL_TABLET | Freq: Once | ORAL | Status: AC
  Administered 2013-03-16: 2 via ORAL

## 2013-03-16 MED ORDER — OXYCODONE-ACETAMINOPHEN 5-325 MG PO TABS
ORAL_TABLET | ORAL | Status: AC
  Filled 2013-03-16: qty 2

## 2013-03-19 MED FILL — Oxycodone w/ Acetaminophen Tab 5-325 MG: ORAL | Qty: 6 | Status: AC

## 2013-04-12 ENCOUNTER — Emergency Department (HOSPITAL_COMMUNITY)
Admission: EM | Admit: 2013-04-12 | Discharge: 2013-04-12 | Disposition: A | Discharge status: Home / Self Care | Payer: BC Managed Care – PPO | Attending: Emergency Medicine | Admitting: Emergency Medicine

## 2013-04-12 ENCOUNTER — Emergency Department (HOSPITAL_COMMUNITY): Payer: BC Managed Care – PPO

## 2013-04-12 ENCOUNTER — Encounter (HOSPITAL_COMMUNITY): Payer: Self-pay | Admitting: *Deleted

## 2013-04-12 DIAGNOSIS — R42 Dizziness and giddiness: Secondary | ICD-10-CM | POA: Insufficient documentation

## 2013-04-12 DIAGNOSIS — J449 Chronic obstructive pulmonary disease, unspecified: Secondary | ICD-10-CM | POA: Insufficient documentation

## 2013-04-12 DIAGNOSIS — F17200 Nicotine dependence, unspecified, uncomplicated: Secondary | ICD-10-CM | POA: Insufficient documentation

## 2013-04-12 DIAGNOSIS — R51 Headache: Secondary | ICD-10-CM | POA: Insufficient documentation

## 2013-04-12 DIAGNOSIS — M542 Cervicalgia: Secondary | ICD-10-CM | POA: Insufficient documentation

## 2013-04-12 DIAGNOSIS — Z9889 Other specified postprocedural states: Secondary | ICD-10-CM | POA: Insufficient documentation

## 2013-04-12 DIAGNOSIS — J4489 Other specified chronic obstructive pulmonary disease: Secondary | ICD-10-CM | POA: Insufficient documentation

## 2013-04-12 DIAGNOSIS — G8929 Other chronic pain: Secondary | ICD-10-CM | POA: Insufficient documentation

## 2013-04-12 LAB — BASIC METABOLIC PANEL
BUN: 6 mg/dL (ref 6–23)
CO2: 29 mEq/L (ref 19–32)
Chloride: 102 mEq/L (ref 96–112)
Creatinine, Ser: 0.73 mg/dL (ref 0.50–1.35)
GFR calc Af Amer: >90 mL/min (ref >90)
Glucose, Bld: 110 mg/dL — ABNORMAL HIGH (ref 70–99)
Potassium: 3.7 mEq/L (ref 3.5–5.1)

## 2013-04-12 LAB — CBC
HCT: 42.9 % (ref 39.0–52.0)
Hemoglobin: 14.7 g/dL (ref 13.0–17.0)
MCV: 94.9 fL (ref 78.0–100.0)
RBC: 4.52 MIL/uL (ref 4.22–5.81)
RDW: 13.6 % (ref 11.5–15.5)
WBC: 15.6 10*3/uL — ABNORMAL HIGH (ref 4.0–10.5)

## 2013-04-12 MED ORDER — ACETAMINOPHEN 325 MG PO TABS
650.0000 mg | ORAL_TABLET | Freq: Once | ORAL | Status: AC
  Administered 2013-04-12: 650 mg via ORAL

## 2013-04-12 MED ORDER — ACETAMINOPHEN 325 MG PO TABS
ORAL_TABLET | ORAL | Status: AC
  Filled 2013-04-12: qty 2

## 2013-04-12 NOTE — ED Notes (Signed)
Patient has not taken his percocet in 3 days but has taken oxyconitn today. States his md was in emergency surgery

## 2013-04-12 NOTE — ED Notes (Signed)
Pt alert & oriented x4, stable gait. Patient  given discharge instructions, paperwork & prescription(s). Patient verbalized understanding. Pt left department w/ no further questions. 

## 2013-04-12 NOTE — ED Provider Notes (Signed)
CSN: [id number]     Arrival date & time 04/12/13  0241 History   First MD Initiated Contact with Patient 04/12/13 0241     Chief Complaint  Patient presents with  . Dizziness  . Headache    HPI Patient presents the emergency department because he states that he had an episode where he passed out today.  He was walking out of his house and onto the front porch.  He states he was sitting in his chair in the next thing he knew he passed out.  This occurred much earlier this evening.  He drove himself to the emergency department tonight at 2:41 AM on his motorcycle.  He states she has a history of chronic neck pain and has had multiple neck surgeries and is on Percocet and OxyContin for his pain.  He states he's been out of his OxyContin for the past 3 days and was unable to get this filled because his physician was in "emergency surgery".  He was able to take a Percocet tonight.  He states he has a headache at this time.  He states he had a headache prior to his episode of passing out on the porch.  His headache came on gradually through the day and was not acute in onset.  He denies any new neck pain but states he has his ongoing chronic neck pain.  He denies weakness of his arms or his leg.  No chest pain shortness of breath.  No palpitations preceding the episode of passing out.  He denies melena or hematochezia.  Denies abdominal pain.  Is not on any anticoagulants.  No recent fever or chills.  He denies recent upper respiratory symptoms or nasal congestion. His headache at this time is mild in severity.  He requests a Tylenol for the pain.   Past Medical History  Diagnosis Date  . Back pain   . Spider bite 2012  . COPD (chronic obstructive pulmonary disease)   . Dysphagia     patient reports d/t surgery, has modified diet, has to tilt head to swallow    Past Surgical History  Procedure Laterality Date  . Back surgery    . Lumbar fusion  2000  . Cholecystectomy  2009  . Anterior cervical  decomp/discectomy fusion  12/22/2011    Procedure: ANTERIOR CERVICAL DECOMPRESSION/DISCECTOMY FUSION 3 LEVELS;  Surgeon: Venita Lick, MD;  Location: [facility] OR;  Service: Orthopedics;  Laterality: N/A;  ACDF C5-T1  . Anterior cervical decomp/discectomy fusion  06/25/2012    Procedure: ANTERIOR CERVICAL DECOMPRESSION/DISCECTOMY FUSION 1 LEVEL/HARDWARE REMOVAL;  Surgeon: Kerrin Champagne, MD;  Location: [facility] OR;  Service: Orthopedics;  Laterality: N/A;  Removal anterior cervical plate W0-J8, Explore Fusion, Left C5-6, C6-7, C7-T1 Re-do Foraminotomy, Left open carpal tunnel release with release of ulnar nerve at [facility], Posterior Cervical Fusion with lateral mass screw  . Carpal tunnel release  06/25/2012    Procedure: CARPAL TUNNEL RELEASE;  Surgeon: Kerrin Champagne, MD;  Location: [facility] OR;  Service: Orthopedics;  Laterality: Left;  as above  . Posterior cervical fusion/foraminotomy  06/25/2012    Procedure: POSTERIOR CERVICAL FUSION/FORAMINOTOMY LEVEL 1;  Surgeon: Kerrin Champagne, MD;  Location: [facility] OR;  Service: Orthopedics;  Laterality: N/A;  as above   Family History  Problem Relation Age of Onset  . Anesthesia problems Neg Hx    History  Substance Use Topics  . Smoking status: Current Every Day Smoker -- 1.00 packs/day for 30 years    Types:  Cigarettes  . Smokeless tobacco: Never Used  . Alcohol Use: No    Review of Systems  All other systems reviewed and are negative.    Allergies  Review of patient's allergies indicates no known allergies.  Home Medications   Current Outpatient Rx  Name  Route  Sig  Dispense  Refill  . clonazePAM (KLONOPIN) 0.5 MG tablet   Oral   Take 0.5 mg by mouth 2 (two) times daily as needed for anxiety.         . nortriptyline (PAMELOR) 10 MG capsule   Oral   Take 10 mg by mouth at bedtime.         . OxyCODONE (OXYCONTIN) 20 mg T12A   Oral   Take 1 tablet (20 mg total) by mouth every 12 (twelve) hours.   15 tablet   0   . oxyCODONE-acetaminophen  (PERCOCET) 7.5-325 MG per tablet      1-2 tabs q 4-6 hrs as needed for severe breakthru pain   60 tablet   0   . methocarbamol (ROBAXIN) 500 MG tablet   Oral   Take 1 tablet (500 mg total) by mouth every 6 (six) hours as needed.   60 tablet   0   . oxyCODONE-acetaminophen (PERCOCET/ROXICET) 5-325 MG per tablet   Oral   Take 1 tablet by mouth every 4 (four) hours as needed for pain.   20 tablet   0   . oxyCODONE-acetaminophen (PERCOCET/ROXICET) 5-325 MG per tablet   Oral   Take 1 tablet by mouth every 4 (four) hours as needed for pain.   6 tablet   0    BP 131/85  Pulse 87  Temp(Src) 97.9 F (36.6 C) (Oral)  Resp 20  Ht 6' (1.829 m)  Wt 170 lb (77.111 kg)  BMI 23.05 kg/m2  SpO2 97% Physical Exam  Nursing note and vitals reviewed. Constitutional: He is oriented to person, place, and time. He appears well-developed and well-nourished.  HENT:  Head: Normocephalic and atraumatic.  Eyes: EOM are normal.  Neck: Normal range of motion.  Cardiovascular: Normal rate, regular rhythm, normal heart sounds and intact distal pulses.   Pulmonary/Chest: Effort normal and breath sounds normal. No respiratory distress.  Abdominal: Soft. He exhibits no distension. There is no tenderness.  Musculoskeletal: Normal range of motion.  Neurological: He is alert and oriented to person, place, and time.  Skin: Skin is warm and dry.  Psychiatric: He has a normal mood and affect. Judgment normal.    ED Course  Procedures (including critical care time)  ECG interpretation   Date: 04/12/2013  Rate: 83  Rhythm: normal sinus rhythm  QRS Axis: normal  Intervals: normal  ST/T Wave abnormalities: normal  Conduction Disutrbances: none  Narrative Interpretation:   Old EKG Reviewed: No significant changes noted     Labs Review Labs Reviewed  CBC - Abnormal; Notable for the following:    WBC 15.6 (*)    All other components within normal limits  BASIC METABOLIC PANEL - Abnormal;  Notable for the following:    Glucose, Bld 110 (*)    All other components within normal limits   Imaging Review No results found.  MDM  No diagnosis found. Patient has normal neurologic exam at this time.  I doubt that he has a subarachnoid hemorrhage.  CT scan of his head was obtained because he states headaches are not typical for him and given the fact that he had a syncopal episode on his  porch today and fell on to make sure that there is no active bleeding in his head.  Unclear etiology of syncope today.  EKG and labs are normal.  Patient was observed on the cardiac monitor here in the emergency department no arrhythmias were noted.  Have asked that he follow up with cardiology as he may benefit from outpatient Holter monitoring.  Entire time the patient is in the emergency department he is more concentrated on the fact that he is out of his OxyContin and that he will not have this refilled until September 24 this for next appointment with his physician who prescribes chronic pain medicine.  He ambulated out of the emergency room without any difficulty.  CT scan questions acute sinusitis however the patient has no upper respiratory symptoms and sinus pain.    Lyanne Co, MD 04/12/13 [phone]

## 2013-04-26 ENCOUNTER — Other Ambulatory Visit (HOSPITAL_COMMUNITY): Payer: Self-pay | Admitting: Specialist

## 2013-04-26 DIAGNOSIS — R1310 Dysphagia, unspecified: Secondary | ICD-10-CM

## 2013-04-29 ENCOUNTER — Other Ambulatory Visit (HOSPITAL_COMMUNITY): Payer: Self-pay | Admitting: Specialist

## 2013-04-29 ENCOUNTER — Ambulatory Visit (HOSPITAL_COMMUNITY)
Admission: RE | Admit: 2013-04-29 | Discharge: 2013-04-29 | Disposition: A | Discharge status: Home / Self Care | Payer: BC Managed Care – PPO | Source: Ambulatory Visit | Attending: Specialist | Admitting: Specialist

## 2013-04-29 DIAGNOSIS — R1310 Dysphagia, unspecified: Secondary | ICD-10-CM | POA: Insufficient documentation

## 2013-04-29 NOTE — Procedures (Signed)
Objective Swallowing Evaluation: Modified Barium Swallowing Study  Patient Details  Name: Bradley Rice MRN: [id number] Date of Birth: [birth year]  Today's Date: 04/29/2013 Time: 1200-1230 SLP Time Calculation (min): 30 min  Past Medical History:  Past Medical History  Diagnosis Date  . Back pain   . Spider bite 2012  . COPD (chronic obstructive pulmonary disease)   . Dysphagia     patient reports d/t surgery, has modified diet, has to tilt head to swallow    Past Surgical History:  Past Surgical History  Procedure Laterality Date  . Back surgery    . Lumbar fusion  2000  . Cholecystectomy  2009  . Anterior cervical decomp/discectomy fusion  12/22/2011    Procedure: ANTERIOR CERVICAL DECOMPRESSION/DISCECTOMY FUSION 3 LEVELS;  Surgeon: Venita Lick, MD;  Location: [facility] OR;  Service: Orthopedics;  Laterality: N/A;  ACDF C5-T1  . Anterior cervical decomp/discectomy fusion  06/25/2012    Procedure: ANTERIOR CERVICAL DECOMPRESSION/DISCECTOMY FUSION 1 LEVEL/HARDWARE REMOVAL;  Surgeon: Kerrin Champagne, MD;  Location: [facility] OR;  Service: Orthopedics;  Laterality: N/A;  Removal anterior cervical plate [id number], Explore Fusion, Left C5-6, C6-7, C7-T1 Re-do Foraminotomy, Left open carpal tunnel release with release of ulnar nerve at [facility], Posterior Cervical Fusion with lateral mass screw  . Carpal tunnel release  06/25/2012    Procedure: CARPAL TUNNEL RELEASE;  Surgeon: Kerrin Champagne, MD;  Location: [facility] OR;  Service: Orthopedics;  Laterality: Left;  as above  . Posterior cervical fusion/foraminotomy  06/25/2012    Procedure: POSTERIOR CERVICAL FUSION/FORAMINOTOMY LEVEL 1;  Surgeon: Kerrin Champagne, MD;  Location: [facility] OR;  Service: Orthopedics;  Laterality: N/A;  as above   HPI:  Pt is a 52 year old who underwent ACDF from C5-6, C6-7, C7-T1 in May of 2013 with subsequent dysphagia. MBS complete 9/5 recommended mechanical soft, thin liquids with precautions and compensatory strategies reporting that  cervical hardware impeding opening of UES likely origin. Pt reports that time, he has undergone a second surgery to remove plates with improved swallowing function and weight gain. He reports however that over the past two months, dysphagia has resumed. Reports choking episode on hamburger only days ago requirng the heimlich manuever and a 40 lb weight loss. He now avoids solids and crushes his pills.      Assessment / Plan / Recommendation Clinical Impression  Dysphagia Diagnosis: Moderate cervical esophageal phase dysphagia Clinical impression: Bradley Rice presents with a moderate cervical esophageal dysphagia characterized by decreased UES relaxation, suspected due to a combination of cervical hardware (although plates removed), possible post-op scar tissue, and questionable esophageal dysmotility, resulting in mild to moderate residue which clears with independent use of a supraglottic swallow with early and prolongued laryngeal elevations and multiple dry swallows. Strength of pharyngeal musculature appears intact.  A left head turn was found to significantly improve opening of UES for improved transit of bolus with only intermittent trace residue remaining. Despite this improved clearance however, patient with continued c/o pharyngeal residuals, grimacing with each swallow raising suspicion for mid-lower esophageal deficits causing a globus sensation greater than what the degree of patient's residue may cause.  No aspiraiton or penetration occurred during this study. Pt is at risk of aspiration although feel that patient's sensation of difficulty is greater than actual deficit. Recommend moistened soft solids, small bites, chewed thoroughly and swallowed the head turned to the left. Liquid wash to follow. Advised patient to discuss with ordering physician prior to resuming solids which he reported he has  not consumed since most recent choking episode. May wish to consider evaluation of mid and lower  esophagus as dysmotility may be a contributing factor. Thorough education including use of demonstration complete with patient regarding above.     Treatment Recommendation  No treatment recommended at this time (pending further intervention by MD)    Diet Recommendation Dysphagia 3 (Mechanical Soft);Thin liquid (moist solids)   Liquid Administration via: Cup;Straw Medication Administration: Crushed with puree Supervision: Patient able to self feed;Full supervision/cueing for compensatory strategies Compensations: Slow rate;Small sips/bites;Multiple dry swallows after each bite/sip;Follow solids with liquid Postural Changes and/or Swallow Maneuvers: Head turn left during swallow;Seated upright 90 degrees    Other  Recommendations Recommended Consults: Consider esophageal assessment (see HPI) Oral Care Recommendations: Oral care BID   Follow Up Recommendations  None               General HPI: Pt is a 52 year old who underwent ACDF from C5-6, C6-7, C7-T1 in May of 2013 with subsequent dysphagia. MBS complete 9/5 recommended mechanical soft, thin liquids with precautions and compensatory strategies reporting that cervical hardware impeding opening of UES likely origin. Pt reports that time, he has undergone a second surgery to remove plates with improved swallowing function and weight gain. He reports however that over the past two months, dysphagia has resumed. Reports choking episode on hamburger only days ago requirng the heimlich manuever and a 40 lb weight loss. He now avoids solids and crushes his pills.  Type of Study: Modified Barium Swallowing Study Reason for Referral: Objectively evaluate swallowing function Previous Swallow Assessment: see HPI Diet Prior to this Study: Thin liquids (per patient, MD advised not to consume solids) Temperature Spikes Noted: No Respiratory Status: Room air History of Recent Intubation: No Behavior/Cognition: Alert;Cooperative;Pleasant mood Oral  Cavity - Dentition: Adequate natural dentition Oral Motor / Sensory Function: Within functional limits Self-Feeding Abilities: Able to feed self Patient Positioning: Upright in chair Baseline Vocal Quality: Clear Volitional Cough: Strong Volitional Swallow: Able to elicit Anatomy: Other (Comment) (cervical hardware noted anteriorly and posteriorly) Pharyngeal Secretions: Not observed secondary MBS    Reason for Referral Objectively evaluate swallowing function   Oral Phase Oral Preparation/Oral Phase Oral Phase: WFL   Pharyngeal Phase Pharyngeal Phase Pharyngeal Phase: Impaired Pharyngeal - Thin Pharyngeal - Thin Cup: Pharyngeal residue - valleculae;Pharyngeal residue - pyriform sinuses Pharyngeal - Thin Straw: Pharyngeal residue - valleculae;Pharyngeal residue - pyriform sinuses Pharyngeal - Solids Pharyngeal - Puree: Pharyngeal residue - valleculae;Pharyngeal residue - pyriform sinuses Pharyngeal - Mechanical Soft: Pharyngeal residue - valleculae;Pharyngeal residue - pyriform sinuses Pharyngeal Phase - Comment Pharyngeal Comment: head turn to left assists to greatly reduce residue  Cervical Esophageal Phase    GO    Cervical Esophageal Phase Cervical Esophageal Phase: Impaired Cervical Esophageal Phase - Thin Thin Cup: Reduced cricopharyngeal relaxation Thin Straw: Reduced cricopharyngeal relaxation Cervical Esophageal Phase - Solids Puree: Reduced cricopharyngeal relaxation Mechanical Soft: Reduced cricopharyngeal relaxation    Functional Assessment Tool Used: skilled clinical judgement Functional Limitations: Swallowing Swallow Current Status ([id number]): At least 40 percent but less than 60 percent impaired, limited or restricted Swallow Goal Status [phone]): At least 40 percent but less than 60 percent impaired, limited or restricted Swallow Discharge Status [phone]): At least 40 percent but less than 60 percent impaired, limited or restricted   [facility] [place],  CCC-SLP [phone]  Ferdinand Lango Meryl 04/29/2013, 4:25 PM

## 2013-05-11 ENCOUNTER — Emergency Department (HOSPITAL_COMMUNITY): Payer: BC Managed Care – PPO

## 2013-05-11 ENCOUNTER — Emergency Department (HOSPITAL_COMMUNITY)
Admission: EM | Admit: 2013-05-11 | Discharge: 2013-05-12 | Disposition: A | Discharge status: Home / Self Care | Payer: BC Managed Care – PPO | Attending: Emergency Medicine | Admitting: Emergency Medicine

## 2013-05-11 ENCOUNTER — Encounter (HOSPITAL_COMMUNITY): Payer: Self-pay | Admitting: Emergency Medicine

## 2013-05-11 DIAGNOSIS — S7000XA Contusion of unspecified hip, initial encounter: Secondary | ICD-10-CM | POA: Insufficient documentation

## 2013-05-11 DIAGNOSIS — H538 Other visual disturbances: Secondary | ICD-10-CM | POA: Insufficient documentation

## 2013-05-11 DIAGNOSIS — Z79899 Other long term (current) drug therapy: Secondary | ICD-10-CM | POA: Insufficient documentation

## 2013-05-11 DIAGNOSIS — IMO0001 Reserved for inherently not codable concepts without codable children: Secondary | ICD-10-CM

## 2013-05-11 DIAGNOSIS — S7001XA Contusion of right hip, initial encounter: Secondary | ICD-10-CM

## 2013-05-11 DIAGNOSIS — H539 Unspecified visual disturbance: Secondary | ICD-10-CM

## 2013-05-11 DIAGNOSIS — J4489 Other specified chronic obstructive pulmonary disease: Secondary | ICD-10-CM | POA: Insufficient documentation

## 2013-05-11 DIAGNOSIS — R319 Hematuria, unspecified: Secondary | ICD-10-CM | POA: Insufficient documentation

## 2013-05-11 DIAGNOSIS — J449 Chronic obstructive pulmonary disease, unspecified: Secondary | ICD-10-CM | POA: Insufficient documentation

## 2013-05-11 DIAGNOSIS — S8000XA Contusion of unspecified knee, initial encounter: Secondary | ICD-10-CM | POA: Insufficient documentation

## 2013-05-11 DIAGNOSIS — F17200 Nicotine dependence, unspecified, uncomplicated: Secondary | ICD-10-CM | POA: Insufficient documentation

## 2013-05-11 DIAGNOSIS — S060X9A Concussion with loss of consciousness of unspecified duration, initial encounter: Secondary | ICD-10-CM | POA: Insufficient documentation

## 2013-05-11 DIAGNOSIS — IMO0002 Reserved for concepts with insufficient information to code with codable children: Secondary | ICD-10-CM | POA: Insufficient documentation

## 2013-05-11 DIAGNOSIS — S161XXA Strain of muscle, fascia and tendon at neck level, initial encounter: Secondary | ICD-10-CM

## 2013-05-11 DIAGNOSIS — Y9355 Activity, bike riding: Secondary | ICD-10-CM | POA: Insufficient documentation

## 2013-05-11 DIAGNOSIS — J441 Chronic obstructive pulmonary disease with (acute) exacerbation: Secondary | ICD-10-CM | POA: Insufficient documentation

## 2013-05-11 DIAGNOSIS — S8001XA Contusion of right knee, initial encounter: Secondary | ICD-10-CM

## 2013-05-11 DIAGNOSIS — T07XXXA Unspecified multiple injuries, initial encounter: Secondary | ICD-10-CM

## 2013-05-11 DIAGNOSIS — Y92410 Unspecified street and highway as the place of occurrence of the external cause: Secondary | ICD-10-CM | POA: Insufficient documentation

## 2013-05-11 DIAGNOSIS — S40019A Contusion of unspecified shoulder, initial encounter: Secondary | ICD-10-CM | POA: Insufficient documentation

## 2013-05-11 DIAGNOSIS — S139XXA Sprain of joints and ligaments of unspecified parts of neck, initial encounter: Secondary | ICD-10-CM | POA: Insufficient documentation

## 2013-05-11 LAB — CBC WITH DIFFERENTIAL/PLATELET
Basophils Absolute: 0.1 10*3/uL (ref 0.0–0.1)
Basophils Relative: 1 % (ref 0–1)
Eosinophils Relative: 2 % (ref 0–5)
HCT: 42.3 % (ref 39.0–52.0)
Hemoglobin: 14.4 g/dL (ref 13.0–17.0)
MCH: 32.2 pg (ref 26.0–34.0)
MCHC: 34 g/dL (ref 30.0–36.0)
MCV: 94.6 fL (ref 78.0–100.0)
Monocytes Absolute: 1.2 10*3/uL — ABNORMAL HIGH (ref 0.1–1.0)
Monocytes Relative: 7 % (ref 3–12)
Neutro Abs: 11 10*3/uL — ABNORMAL HIGH (ref 1.7–7.7)
RDW: 13.1 % (ref 11.5–15.5)

## 2013-05-11 LAB — COMPREHENSIVE METABOLIC PANEL
Albumin: 3.7 g/dL (ref 3.5–5.2)
BUN: 5 mg/dL — ABNORMAL LOW (ref 6–23)
Calcium: 9.7 mg/dL (ref 8.4–10.5)
Chloride: 100 mEq/L (ref 96–112)
Creatinine, Ser: 0.75 mg/dL (ref 0.50–1.35)
GFR calc non Af Amer: >90 mL/min (ref >90)
Total Bilirubin: 0.5 mg/dL (ref 0.3–1.2)

## 2013-05-11 LAB — URINALYSIS, ROUTINE W REFLEX MICROSCOPIC
Bilirubin Urine: NEGATIVE
Ketones, ur: NEGATIVE mg/dL
Leukocytes, UA: NEGATIVE
Nitrite: NEGATIVE
Specific Gravity, Urine: 1.01 (ref 1.005–1.030)
Urobilinogen, UA: 0.2 mg/dL (ref 0.0–1.0)
pH: 7 (ref 5.0–8.0)

## 2013-05-11 MED ORDER — OXYCODONE-ACETAMINOPHEN 7.5-325 MG PO TABS: 1.0000 | ORAL_TABLET | Freq: Four times a day (QID) | ORAL | Status: DC | PRN

## 2013-05-11 MED ORDER — HYDROMORPHONE HCL PF 2 MG/ML IJ SOLN
2.0000 mg | Freq: Once | INTRAMUSCULAR | Status: AC
  Administered 2013-05-11: 2 mg via INTRAVENOUS
  Filled 2013-05-11: qty 1

## 2013-05-11 MED ORDER — IOHEXOL 300 MG/ML  SOLN
100.0000 mL | Freq: Once | INTRAMUSCULAR | Status: AC | PRN
  Administered 2013-05-11: 100 mL via INTRAVENOUS

## 2013-05-11 MED ORDER — BACITRACIN ZINC 500 UNIT/GM EX OINT
TOPICAL_OINTMENT | CUTANEOUS | Status: AC
  Filled 2013-05-11: qty 0.9

## 2013-05-11 MED ORDER — OXYCODONE-ACETAMINOPHEN 5-325 MG PO TABS
1.0000 | ORAL_TABLET | Freq: Once | ORAL | Status: AC
  Administered 2013-05-11: 1 via ORAL
  Filled 2013-05-11: qty 1

## 2013-05-11 MED ORDER — HYDROMORPHONE HCL PF 1 MG/ML IJ SOLN
1.0000 mg | Freq: Once | INTRAMUSCULAR | Status: AC
  Administered 2013-05-11: 1 mg via INTRAVENOUS
  Filled 2013-05-11: qty 1

## 2013-05-11 MED ORDER — SODIUM CHLORIDE 0.9 % IV BOLUS (SEPSIS)
500.0000 mL | Freq: Once | INTRAVENOUS | Status: AC
  Administered 2013-05-11: 500 mL via INTRAVENOUS
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
## 2013-05-11 NOTE — ED Notes (Signed)
From triage by w/c with c-collar in place. Back checked while in w/c. Pt was undressed and moved to stretcher with c-spine maintained manually by staff. Pt able to stand briefly weighbearing on left leg. Not placed on log-board per Dr. Rubin Payor.

## 2013-05-12 NOTE — ED Notes (Signed)
For trial ambulation per dr pickering. Pt tolerated same fairly well. Walked stooped over slightly and very slowly but states he thinks he's good to go home. Fitted with crutches. Ambulates better with same.

## 2013-05-20 ENCOUNTER — Emergency Department (HOSPITAL_COMMUNITY): Payer: BC Managed Care – PPO

## 2013-05-20 ENCOUNTER — Encounter (HOSPITAL_COMMUNITY): Payer: Self-pay | Admitting: Emergency Medicine

## 2013-05-20 ENCOUNTER — Emergency Department (HOSPITAL_COMMUNITY)
Admission: EM | Admit: 2013-05-20 | Discharge: 2013-05-21 | Disposition: A | Discharge status: Home / Self Care | Payer: BC Managed Care – PPO | Attending: Emergency Medicine | Admitting: Emergency Medicine

## 2013-05-20 DIAGNOSIS — Y92009 Unspecified place in unspecified non-institutional (private) residence as the place of occurrence of the external cause: Secondary | ICD-10-CM | POA: Insufficient documentation

## 2013-05-20 DIAGNOSIS — J449 Chronic obstructive pulmonary disease, unspecified: Secondary | ICD-10-CM | POA: Insufficient documentation

## 2013-05-20 DIAGNOSIS — S79919A Unspecified injury of unspecified hip, initial encounter: Secondary | ICD-10-CM | POA: Insufficient documentation

## 2013-05-20 DIAGNOSIS — J4489 Other specified chronic obstructive pulmonary disease: Secondary | ICD-10-CM | POA: Insufficient documentation

## 2013-05-20 DIAGNOSIS — F17200 Nicotine dependence, unspecified, uncomplicated: Secondary | ICD-10-CM | POA: Insufficient documentation

## 2013-05-20 DIAGNOSIS — G8929 Other chronic pain: Secondary | ICD-10-CM | POA: Insufficient documentation

## 2013-05-20 DIAGNOSIS — M25551 Pain in right hip: Secondary | ICD-10-CM

## 2013-05-20 DIAGNOSIS — Y9301 Activity, walking, marching and hiking: Secondary | ICD-10-CM | POA: Insufficient documentation

## 2013-05-20 DIAGNOSIS — M549 Dorsalgia, unspecified: Secondary | ICD-10-CM | POA: Insufficient documentation

## 2013-05-20 DIAGNOSIS — Z79899 Other long term (current) drug therapy: Secondary | ICD-10-CM | POA: Insufficient documentation

## 2013-05-20 DIAGNOSIS — X500XXA Overexertion from strenuous movement or load, initial encounter: Secondary | ICD-10-CM | POA: Insufficient documentation

## 2013-05-20 MED ORDER — OXYCODONE-ACETAMINOPHEN 5-325 MG PO TABS
1.0000 | ORAL_TABLET | Freq: Once | ORAL | Status: AC
  Administered 2013-05-20: 1 via ORAL
  Filled 2013-05-20: qty 1
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
## 2013-05-21 MED ORDER — OXYCODONE-ACETAMINOPHEN 5-325 MG PO TABS: 1.0000 | ORAL_TABLET | ORAL | Status: DC | PRN

## 2013-05-21 MED ORDER — NAPROXEN 500 MG PO TABS: 500.0000 mg | ORAL_TABLET | Freq: Two times a day (BID) | ORAL | Status: DC

## 2013-05-21 NOTE — ED Notes (Signed)
MD at bedside. 

## 2013-05-23 ENCOUNTER — Other Ambulatory Visit: Payer: Self-pay | Admitting: Specialist

## 2013-05-23 ENCOUNTER — Ambulatory Visit
Admission: RE | Admit: 2013-05-23 | Discharge: 2013-05-23 | Disposition: A | Discharge status: Home / Self Care | Payer: 59 | Source: Ambulatory Visit | Attending: Specialist | Admitting: Specialist

## 2013-05-23 DIAGNOSIS — M25551 Pain in right hip: Secondary | ICD-10-CM

## 2013-05-23 DIAGNOSIS — R1031 Right lower quadrant pain: Secondary | ICD-10-CM

## 2013-05-24 ENCOUNTER — Other Ambulatory Visit: Payer: Self-pay | Admitting: Specialist

## 2013-05-24 DIAGNOSIS — M25551 Pain in right hip: Secondary | ICD-10-CM

## 2013-05-27 MED FILL — Oxycodone w/ Acetaminophen Tab 5-325 MG: ORAL | Qty: 6 | Status: AC

## 2013-06-04 ENCOUNTER — Other Ambulatory Visit: Payer: BC Managed Care – PPO

## 2013-06-08 ENCOUNTER — Ambulatory Visit
Admission: RE | Admit: 2013-06-08 | Discharge: 2013-06-08 | Disposition: A | Discharge status: Home / Self Care | Payer: 59 | Source: Ambulatory Visit | Attending: Specialist | Admitting: Specialist

## 2013-06-08 ENCOUNTER — Other Ambulatory Visit: Payer: BC Managed Care – PPO

## 2013-06-08 DIAGNOSIS — M25551 Pain in right hip: Secondary | ICD-10-CM

## 2013-08-01 DIAGNOSIS — Z8673 Personal history of transient ischemic attack (TIA), and cerebral infarction without residual deficits: Secondary | ICD-10-CM

## 2013-08-01 HISTORY — DX: Personal history of transient ischemic attack (TIA), and cerebral infarction without residual deficits: Z86.73

## 2013-09-09 ENCOUNTER — Telehealth: Payer: Self-pay | Admitting: Family Medicine

## 2013-11-13 ENCOUNTER — Other Ambulatory Visit (HOSPITAL_COMMUNITY): Payer: Self-pay | Admitting: Specialist

## 2013-11-17 IMAGING — CR DG CERVICAL SPINE COMPLETE 4+V
5 series · 5 of 5 positions shown · non-contrast
Comparison: Prior radiograph from 12/22/2011

CLINICAL DATA: Neck pain, sore throat

CERVICAL SPINE - COMPLETE 4+ VIEW

[view not recorded (1 of 5)]
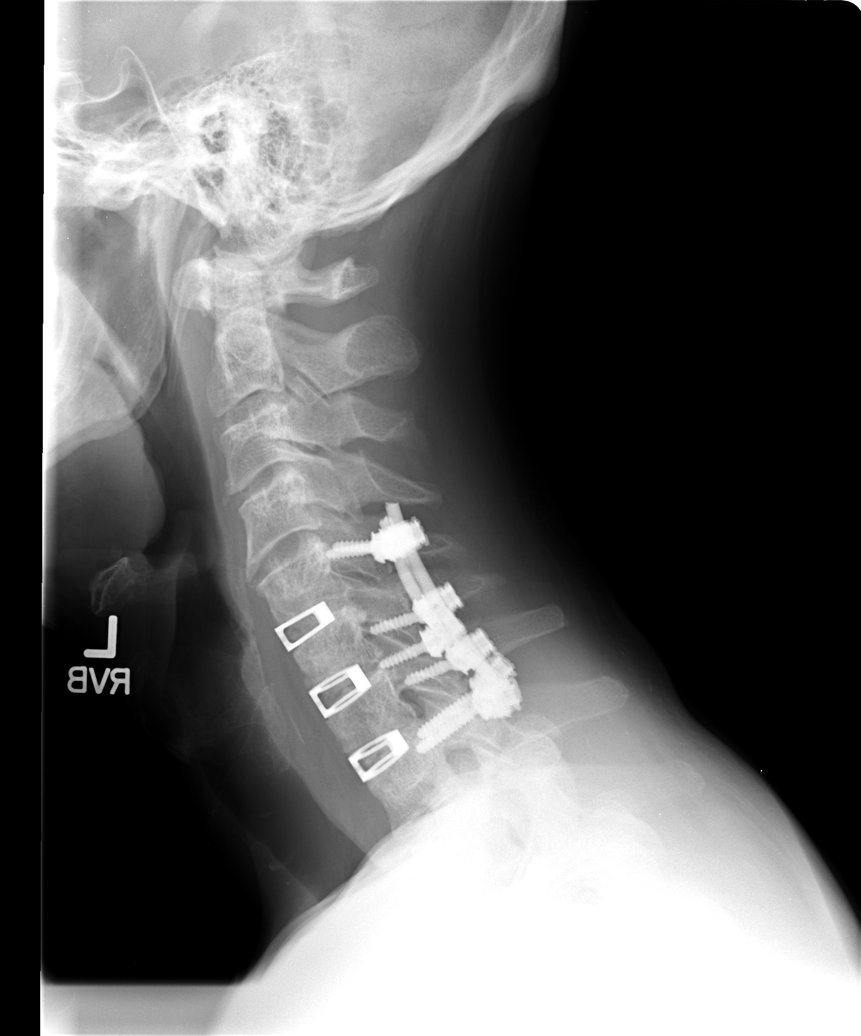

[view not recorded (2 of 5)]
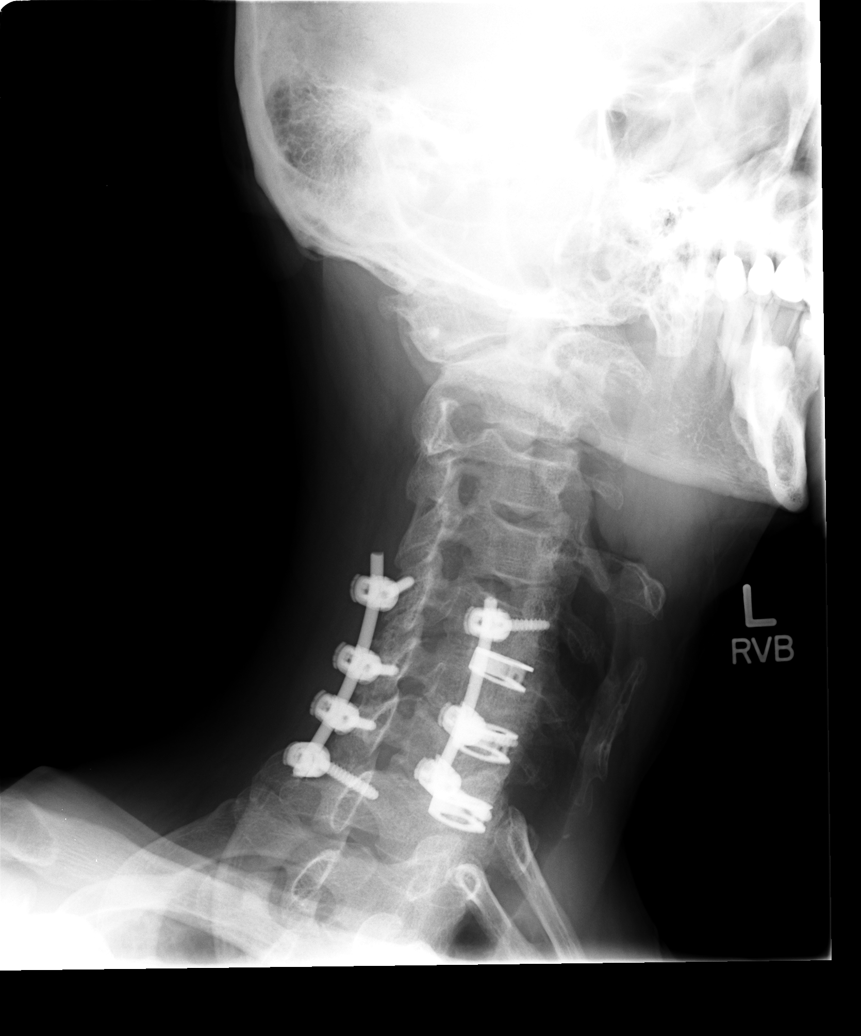

[view not recorded (3 of 5)]
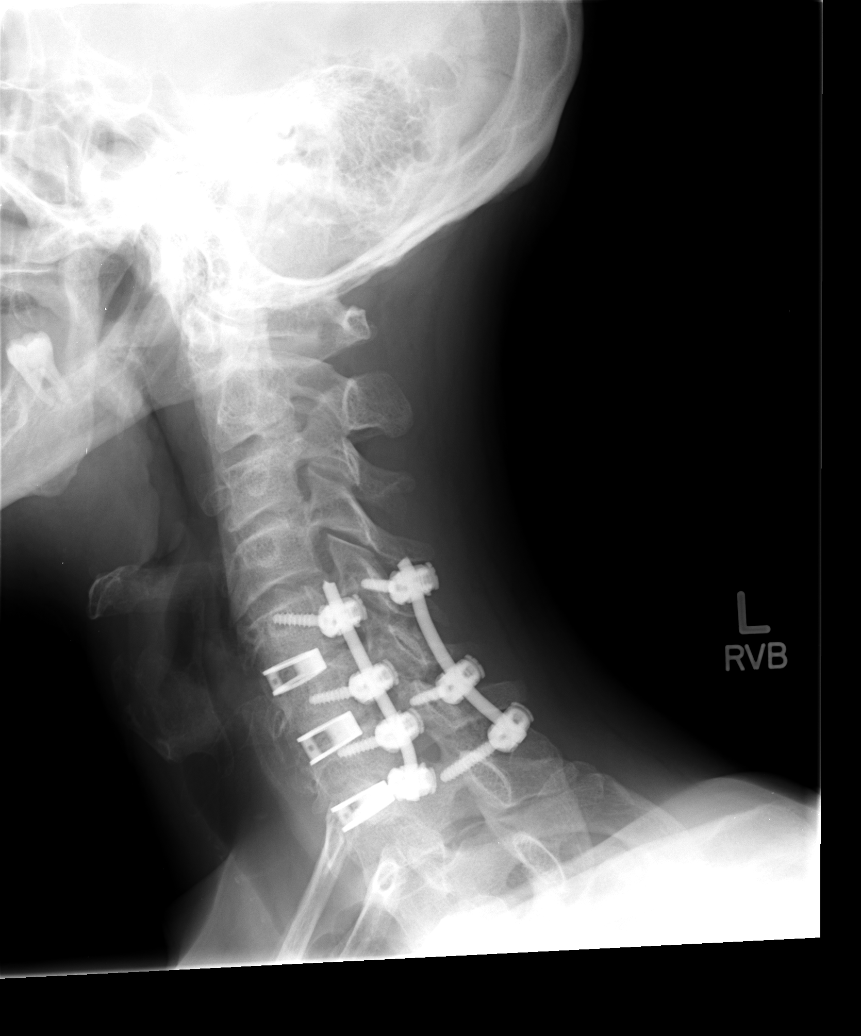

[view not recorded (4 of 5)]
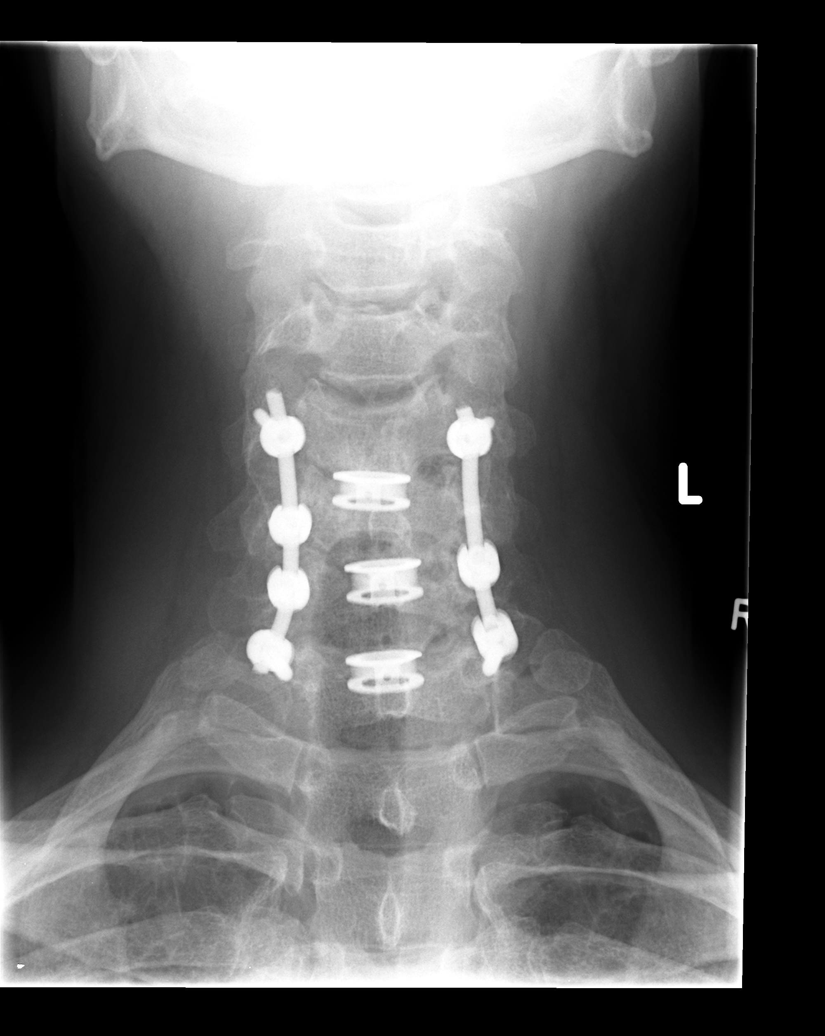

[view not recorded (5 of 5)]
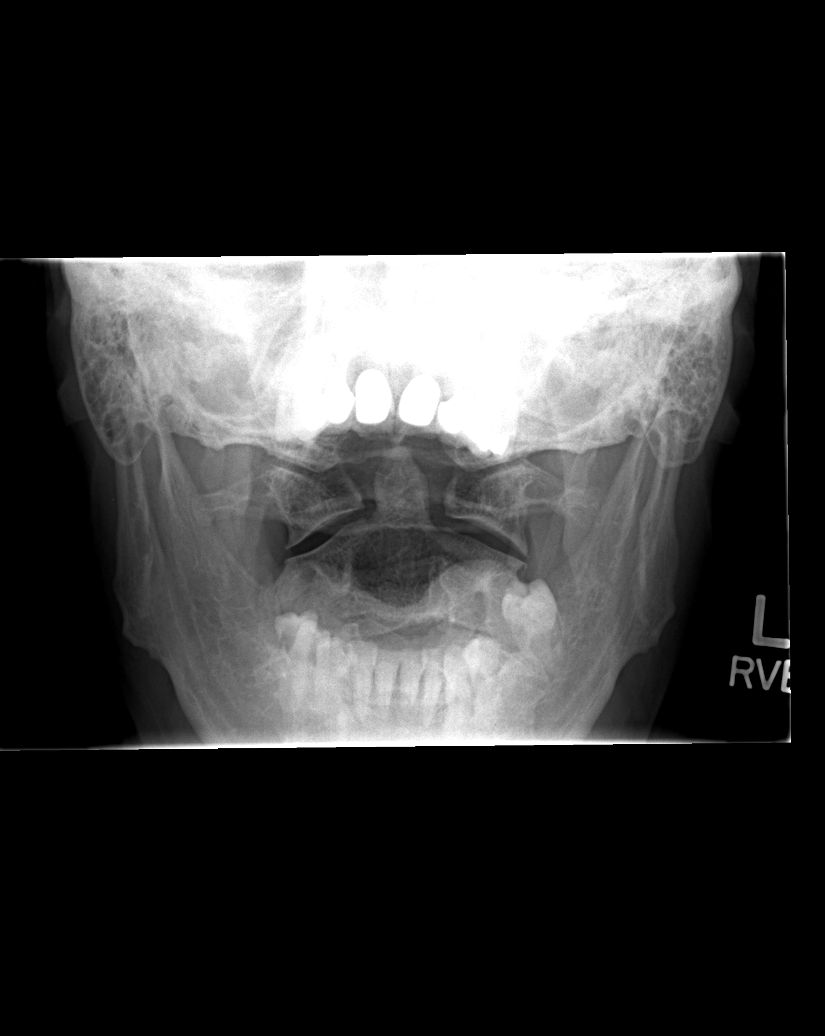

[5 of 5 positions shown; findings below may reference images not displayed]

FINDINGS: Postoperative changes from prior posterior spinal
fixation with interbody fusion are seen at the C5-3 T1 levels
without complication.  There is preservation of the normal cervical
lordosis.  No fracture or listhesis.  Normal C1-2 articulations are
intact.

Multilevel foraminal narrowing is seen within the visualized spine,
most prominent at C6-7..  Intervertebral disc space narrowing with
endplate spurring is present at C3-4 and C4-5.  No soft tissue
abnormality.

Visualized lung apices are grossly clear.
IMPRESSION: 1.  Postoperative changes from prior posterior spinal fixation with
interbody fusion at C5-T1 without complication.
2.  Degenerative disc disease at C3-4, C4-5, and C6-7 as above.

## 2013-11-21 ENCOUNTER — Ambulatory Visit (INDEPENDENT_AMBULATORY_CARE_PROVIDER_SITE_OTHER): Payer: BC Managed Care – PPO | Admitting: Family Medicine

## 2013-11-21 ENCOUNTER — Encounter: Payer: Self-pay | Admitting: Family Medicine

## 2013-11-21 ENCOUNTER — Encounter (INDEPENDENT_AMBULATORY_CARE_PROVIDER_SITE_OTHER): Payer: Self-pay

## 2013-11-21 ENCOUNTER — Telehealth: Payer: Self-pay | Admitting: Family Medicine

## 2013-11-21 ENCOUNTER — Telehealth: Payer: Self-pay

## 2013-11-21 VITALS — BP 128/85 | HR 95 | Temp 98.6°F | Ht 72.0 in | Wt 168.2 lb

## 2013-11-21 DIAGNOSIS — M47819 Spondylosis without myelopathy or radiculopathy, site unspecified: Secondary | ICD-10-CM

## 2013-11-21 DIAGNOSIS — M479 Spondylosis, unspecified: Secondary | ICD-10-CM

## 2013-11-21 DIAGNOSIS — M960 Pseudarthrosis after fusion or arthrodesis: Secondary | ICD-10-CM

## 2013-11-21 DIAGNOSIS — N529 Male erectile dysfunction, unspecified: Secondary | ICD-10-CM

## 2013-11-21 DIAGNOSIS — T84498A Other mechanical complication of other internal orthopedic devices, implants and grafts, initial encounter: Secondary | ICD-10-CM

## 2013-11-21 MED ORDER — SILDENAFIL CITRATE 100 MG PO TABS: 100.0000 mg | ORAL_TABLET | Freq: Every day | ORAL | Status: DC | PRN

## 2013-11-21 NOTE — Telephone Encounter (Signed)
I was seen today and I have new insurance that I need to give someone to put in computer

## 2013-11-21 NOTE — Patient Instructions (Signed)
Cervical Fusion The neck is the upper portion of your spine. The 7 bones in your neck are referred to as the cervical spine. Cervical fusion is a type of surgery that is done on the cervical spine to relieve pressure on the spinal cord or one or more nerve roots. There are two types of cervical fusion:  Anterior cervical fusion. This surgery is done through the front (anterior) part of your neck. During the surgery the affected intervertebral disk is removed to take pressure off the nerves or spinal cord. The area where the disc was removed is filled with a bone graft that causes the vertebral bodies to grow together (fuse) over time.  Posterior cervical fusion. This surgery is done through the back (posterior) of the neck. The surgery joins two or more neck vertebrae into one solid section of bone. Posterior cervical fusion is most commonly used to treat neck fractures and dislocations and to fix deformities in the curve of the neck. LET [facility] CARE PROVIDER KNOW ABOUT:  Any allergies you have.  All medicines you are taking, including vitamins, herbs, eyedrops, creams, and over-the-counter medicines.  Previous problems you or members of your family have had with the use of anesthetics.  Any blood disorders or blot clotting problems you have.  Previous surgeries you have had.  Medical conditions you have. RISKS AND COMPLICATIONS Generally, this is a safe procedure. However, as with any procedure, complications can occur. Possible complications include:   Infection.   Bleeding with possible need for blood transfusion.   Injury to surrounding structures, including nerves.   Leakage of cerebrospinal fluid.   Blood clots.  Temporary breathing difficulties after surgery.  Extended hospital stay, especially with posterior cervical fusion. BEFORE THE PROCEDURE  Do not eat or drink for 6 8 hours before the procedure.   Take medicines as directed by your surgeon. Ask your  surgeon about changing or stopping your regular medicines.   You will be given antibiotic medicines to keep the infection rate down.   The surgical cut (incision) site on your neck will be marked.   Your neck will be cleaned to reduce the risk of infection. PROCEDURE  The length of the procedure depends on what needs to be done. It usually takes 2 or more hours. For both procedures, you will be given medicine to make you sleep (general anesthetic). A breathing tube will be placed down your throat.  Anterior Cervical Fusion  An incision will usually be made in a skin fold line at the front of your neck, in the area where the fusion will be placed.  The neck muscles will be pushed aside.   The surgeon will remove the affected, degenerated disk and bone spurs (decompression). This helps to take the pressure off the nerves and spinal cord.   The area where the disk was removed is then filled with a plastic spacer implant, bone graft, or both. These implants and bone grafts take the place of the disk and keep the nerve passageway open and clear for the nerves and spinal cord.   In most cases, the surgeon will put metal plates, pins, or screws (hardware) in the neck to help stabilize the surgical site and to keep the implants and bone grafts in place. The hardware reduces motion at the surgical site, so the bones can grow together. This provides extra support to the neck.  Posterior Cervical Fusion  An incision will be made through the back of the neck.  Two or more neck vertebrae will be joined into one solid section of bone.   Metal plates and pins or screws may be placed in the neck. These help stabilize the neck, providing extra support to help the bones to grow together more easily. AFTER THE PROCEDURE  You will stay in a recovery area until the anesthesia has worn off. Your blood pressure and pulse will be checked often.   You may continue to receive fluids and medicines,  such as antibiotics, through the IV tube for several days after the surgery.   You may need to wear a neck or back brace for several weeks after surgery, especially when up and out of bed.   You may be given pain medicine while still in the recovery area. Some pain is normal, but if your pain gets worse, tell your surgeon or nurse.   Be up and moving as soon as possible after surgery. Physical therapists will help you start walking.   To prevent blood clots in your legs:  You may be given compression stockings to wear.   You may need to take medicine to prevent clots.  You may be asked to do breathing exercises. This is to prevent a lung infection.   Most people stay in the hospital for 1 3 days after this surgery.  Document Released: 01/07/2002 Document Revised: 03/20/2013 Document Reviewed: 01/17/2013 [facility] Patient Information 2014 [place], [place].

## 2013-11-21 NOTE — Progress Notes (Signed)
Subjective:    Patient ID: Bradley Rice, male    DOB: 02/01/1961, 53 y.o.   MRN: [id number]  HPI Pt here today needing a referral to see his Orthopedic. Pt has hx of two different neck surgeries. Pt has seen Dr Deno Etienne in the past, but has switched insurance companies and needs a new referral. No other complaints at this time Past Medical History  Diagnosis Date   Back pain    Spider bite 2012   COPD (chronic obstructive pulmonary disease)    Dysphagia     patient reports d/t surgery, has modified diet, has to tilt head to swallow    Past Surgical History  Procedure Laterality Date   Back surgery     Lumbar fusion  2000   Cholecystectomy  2009   Anterior cervical decomp/discectomy fusion  12/22/2011    Procedure: ANTERIOR CERVICAL DECOMPRESSION/DISCECTOMY FUSION 3 LEVELS;  Surgeon: Melina Schools, MD;  Location: [place];  Service: Orthopedics;  Laterality: N/A;  ACDF C5-T1   Anterior cervical decomp/discectomy fusion  06/25/2012    Procedure: ANTERIOR CERVICAL DECOMPRESSION/DISCECTOMY FUSION 1 LEVEL/HARDWARE REMOVAL;  Surgeon: [name], MD;  Location: [place];  Service: Orthopedics;  Laterality: N/A;  Removal anterior cervical plate C5-T1, Explore Fusion, Left C5-6, C6-7, C7-T1 Re-do Foraminotomy, Left open carpal tunnel release with release of ulnar nerve at [facility], Posterior Cervical Fusion with lateral mass screw   Carpal tunnel release  06/25/2012    Procedure: CARPAL TUNNEL RELEASE;  Surgeon: [name], MD;  Location: [place];  Service: Orthopedics;  Laterality: Left;  as above   Posterior cervical fusion/foraminotomy  06/25/2012    Procedure: POSTERIOR CERVICAL FUSION/FORAMINOTOMY LEVEL 1;  Surgeon: [name], MD;  Location: [place];  Service: Orthopedics;  Laterality: N/A;  as above   No Known Allergies Current Outpatient Prescriptions on File Prior to Visit  Medication Sig Dispense Refill   clonazePAM (KLONOPIN) 0.5 MG tablet Take 0.5 mg by mouth 2 (two)  times daily as needed for anxiety.       methocarbamol (ROBAXIN) 500 MG tablet Take 1 tablet (500 mg total) by mouth every 6 (six) hours as needed.  60 tablet  0   naproxen (NAPROSYN) 500 MG tablet Take 1 tablet (500 mg total) by mouth 2 (two) times daily.  30 tablet  0   nortriptyline (PAMELOR) 10 MG capsule Take 10 mg by mouth 2 (two) times daily.        OxyCODONE (OXYCONTIN) 20 mg T12A Take 1 tablet (20 mg total) by mouth every 12 (twelve) hours.  15 tablet  0   oxyCODONE-acetaminophen (PERCOCET) 7.5-325 MG per tablet Take 1 tablet by mouth every 6 (six) hours as needed for pain.  30 tablet  0   No current facility-administered medications on file prior to visit.      Review of Systems  Constitutional: Negative.   Respiratory: Negative.   Cardiovascular: Negative.   Gastrointestinal: Negative.   Musculoskeletal: Positive for back pain, neck pain and neck stiffness.  Neurological: Negative.   Hematological: Negative.   Psychiatric/Behavioral: Negative.   All other systems reviewed and are negative.      Objective:   Physical Exam  Vitals reviewed. Constitutional: He is oriented to person, place, and time. He appears well-developed and well-nourished. No distress.  HENT:  Head: Normocephalic.  Nose: Nose normal.  Eyes: Pupils are equal, round, and reactive to light.  Neck: No thyromegaly present.  Limited ROM due to hx of  cervical fusion in past  Cardiovascular: Normal rate, regular rhythm, normal heart sounds and intact distal pulses.   No murmur heard. Pulmonary/Chest: Effort normal. No respiratory distress. He has wheezes.  Pt states he has allergies    Abdominal: Soft. Bowel sounds are normal. He exhibits no distension. There is no tenderness.  Musculoskeletal: Normal range of motion.  Neurological: He is alert and oriented to person, place, and time.  Skin: Skin is warm and dry.  Psychiatric: He has a normal mood and affect. His behavior is normal. Judgment and  thought content normal.    BP 128/85   Pulse 95   Temp(Src) 98.6 F (37 C) (Oral)   Ht 6' (1.829 m)   Wt 168 lb 3.2 oz (76.295 kg)   BMI 22.81 kg/m2       Assessment & Plan:  1. Failed cervical fusion - Ambulatory referral to Orthopedic Surgery  2. Multilevel spondylosis Keep appointment with [facility] Dr Basil Dess for neck surgery  3. ED (erectile dysfunction)  - sildenafil (VIAGRA) 100 MG tablet; Take 1 tablet (100 mg total) by mouth daily as needed for erectile dysfunction.  Dispense: 30 tablet; Refill: 2   Continue all meds Diet and exercise encouraged  Evelina Dun, FNP

## 2013-11-26 ENCOUNTER — Other Ambulatory Visit (HOSPITAL_COMMUNITY): Payer: BC Managed Care – PPO

## 2013-12-02 ENCOUNTER — Encounter (HOSPITAL_COMMUNITY): Admission: RE | Payer: Self-pay | Source: Ambulatory Visit

## 2013-12-02 ENCOUNTER — Ambulatory Visit (HOSPITAL_COMMUNITY)
Admission: RE | Admit: 2013-12-02 | Payer: No Typology Code available for payment source | Source: Ambulatory Visit | Admitting: Specialist

## 2013-12-02 ENCOUNTER — Telehealth: Payer: Self-pay | Admitting: *Deleted

## 2013-12-02 SURGERY — POSTERIOR CERVICAL FUSION/FORAMINOTOMY LEVEL 1
Anesthesia: General

## 2013-12-02 NOTE — Telephone Encounter (Signed)
Patient called requesting appt for pain medication refill. He has surgery scheduled with Dr. Louanne Skye and said that his office instructed him to call us for refills until his surgery because they don't have any appts available until the day of his surgery.  I called Dr. Otho Ket office because this sounded very strange. They reported that he was given a script for Percocet #120, Klonopin, and Robaxin on 11/20/13 and that they didn't tell him to call us but that they told him it was too early for refills.  Spoke with patient again. He said the script was given to his wife and he hasn't checked on it yet. Explained that this should be enough to last until his surgery on 12/17/13.

## 2013-12-04 ENCOUNTER — Telehealth: Payer: Self-pay | Admitting: Family Medicine

## 2013-12-04 MED ORDER — VARDENAFIL HCL 10 MG PO TBDP: 10.0000 mg | ORAL_TABLET | Freq: Every day | ORAL | Status: DC | PRN

## 2013-12-04 NOTE — Telephone Encounter (Signed)
Patient insurance won't cover cialis.  Please order another medication.

## 2013-12-04 NOTE — Telephone Encounter (Signed)
Rx sent to pharmacy. Pharmacy will contact pt when RX is ready for pick-up.

## 2013-12-05 ENCOUNTER — Telehealth: Payer: Self-pay | Admitting: Family Medicine

## 2013-12-05 ENCOUNTER — Other Ambulatory Visit: Payer: Self-pay | Admitting: Specialist

## 2013-12-05 DIAGNOSIS — R200 Anesthesia of skin: Secondary | ICD-10-CM

## 2013-12-06 ENCOUNTER — Telehealth: Payer: Self-pay | Admitting: Family Medicine

## 2013-12-06 ENCOUNTER — Telehealth: Payer: Self-pay | Admitting: Family

## 2013-12-06 ENCOUNTER — Ambulatory Visit: Payer: 59 | Admitting: Neurology

## 2013-12-06 ENCOUNTER — Telehealth: Payer: Self-pay | Admitting: Neurology

## 2013-12-06 ENCOUNTER — Encounter (HOSPITAL_COMMUNITY): Payer: Self-pay | Admitting: Pharmacy Technician

## 2013-12-06 MED ORDER — TADALAFIL 5 MG PO TABS: 5.0000 mg | ORAL_TABLET | Freq: Every day | ORAL | Status: DC

## 2013-12-06 NOTE — Telephone Encounter (Signed)
Please advise 

## 2013-12-06 NOTE — Telephone Encounter (Signed)
30 day RX sent to pharm for erectile dysfunction.

## 2013-12-06 NOTE — Telephone Encounter (Signed)
Patient aware we are out of samples and that i will let Butch Penny know that he needs a prior authorization

## 2013-12-06 NOTE — Telephone Encounter (Signed)
The patient did not show for a new patient appointment today.

## 2013-12-09 ENCOUNTER — Other Ambulatory Visit: Payer: Self-pay | Admitting: Family

## 2013-12-09 MED ORDER — TADALAFIL 5 MG PO TABS: 5.0000 mg | ORAL_TABLET | Freq: Every day | ORAL | Status: DC

## 2013-12-09 NOTE — Telephone Encounter (Signed)
Pa done, pharmacy notified and message left for pt.

## 2013-12-11 ENCOUNTER — Encounter (HOSPITAL_COMMUNITY)
Admission: RE | Admit: 2013-12-11 | Discharge: 2013-12-11 | Disposition: A | Discharge status: Home / Self Care | Payer: 59 | Source: Ambulatory Visit | Attending: Specialist | Admitting: Specialist

## 2013-12-11 ENCOUNTER — Encounter (HOSPITAL_COMMUNITY): Payer: Self-pay

## 2013-12-11 ENCOUNTER — Ambulatory Visit (HOSPITAL_COMMUNITY)
Admission: RE | Admit: 2013-12-11 | Discharge: 2013-12-11 | Disposition: A | Discharge status: Home / Self Care | Payer: 59 | Source: Ambulatory Visit | Attending: Anesthesiology | Admitting: Anesthesiology

## 2013-12-11 DIAGNOSIS — I2510 Atherosclerotic heart disease of native coronary artery without angina pectoris: Secondary | ICD-10-CM | POA: Insufficient documentation

## 2013-12-11 DIAGNOSIS — Z01812 Encounter for preprocedural laboratory examination: Secondary | ICD-10-CM | POA: Insufficient documentation

## 2013-12-11 DIAGNOSIS — Z01818 Encounter for other preprocedural examination: Secondary | ICD-10-CM | POA: Insufficient documentation

## 2013-12-11 DIAGNOSIS — J438 Other emphysema: Secondary | ICD-10-CM | POA: Insufficient documentation

## 2013-12-11 HISTORY — DX: Major depressive disorder, single episode, unspecified: F32.9

## 2013-12-11 HISTORY — DX: Atherosclerotic heart disease of native coronary artery without angina pectoris: I25.10

## 2013-12-11 HISTORY — DX: Depression, unspecified: F32.A

## 2013-12-11 HISTORY — DX: Personal history of other medical treatment: Z92.89

## 2013-12-11 LAB — COMPREHENSIVE METABOLIC PANEL
ALBUMIN: 3.8 g/dL (ref 3.5–5.2)
ALT: 11 U/L (ref 0–53)
AST: 16 U/L (ref 0–37)
Alkaline Phosphatase: 88 U/L (ref 39–117)
BUN: 10 mg/dL (ref 6–23)
CO2: 27 mEq/L (ref 19–32)
CREATININE: 0.91 mg/dL (ref 0.50–1.35)
Calcium: 9.4 mg/dL (ref 8.4–10.5)
Chloride: 103 mEq/L (ref 96–112)
GFR calc Af Amer: >90 mL/min (ref >90)
GFR calc non Af Amer: >90 mL/min (ref >90)
Glucose, Bld: 113 mg/dL — ABNORMAL HIGH (ref 70–99)
Potassium: 4.9 mEq/L (ref 3.7–5.3)
Sodium: 141 mEq/L (ref 137–147)
TOTAL PROTEIN: 7.6 g/dL (ref 6.0–8.3)
Total Bilirubin: <0.2 mg/dL — ABNORMAL LOW (ref 0.3–1.2)

## 2013-12-11 LAB — CBC
HCT: 42.8 % (ref 39.0–52.0)
Hemoglobin: 14.5 g/dL (ref 13.0–17.0)
MCH: 32.2 pg (ref 26.0–34.0)
MCHC: 33.9 g/dL (ref 30.0–36.0)
MCV: 95.1 fL (ref 78.0–100.0)
Platelets: 310 10*3/uL (ref 150–400)
RBC: 4.5 MIL/uL (ref 4.22–5.81)
RDW: 13.2 % (ref 11.5–15.5)
WBC: 10.6 10*3/uL — ABNORMAL HIGH (ref 4.0–10.5)

## 2013-12-11 LAB — SURGICAL PCR SCREEN
MRSA, PCR: NEGATIVE
STAPHYLOCOCCUS AUREUS: NEGATIVE

## 2013-12-11 LAB — TYPE AND SCREEN
ABO/RH(D): O POS
ANTIBODY SCREEN: NEGATIVE

## 2013-12-11 NOTE — Pre-Procedure Instructions (Signed)
Bradley Rice  12/11/2013   Your procedure is scheduled on:  Tuesday, May 19 at 0730 AM  Report to [facility] at 0530 AM.  Call this number if you have problems the morning of surgery: [phone]   Remember:   Do not eat food or drink liquids after midnight.Monday night   Take these medicines the morning of surgery with A SIP OF WATER: clonazepam as needed for anxiety, Morphine or oxycodone as needed for pain, Nortriptyline (Pamelor)   Do not wear jewelry, make-up or nail polish.  Do not wear lotions, powders, or perfumes. Do not wear deodorant.  Do not shave 48 hours prior to surgery. Men may shave face and neck.  Do not bring valuables to the hospital.  [facility] is not responsible    for any belongings or valuables.               Contacts, dentures or bridgework may not be worn into surgery.  Leave suitcase in the car. After surgery it may be brought to your room.  For patients admitted to the hospital, discharge time is determined by your   treatment team.               Special Instructions: [place] - Preparing for Surgery  Before surgery, you can play an important role.  Because skin is not sterile, your skin needs to be as free of germs as possible.  You can reduce the number of germs on you skin by washing with CHG (chlorahexidine gluconate) soap before surgery.  CHG is an antiseptic cleaner which kills germs and bonds with the skin to continue killing germs even after washing.  Please DO NOT use if you have an allergy to CHG or antibacterial soaps.  If your skin becomes reddened/irritated stop using the CHG and inform your nurse when you arrive at Short Stay.  Do not shave (including legs and underarms) for at least 48 hours prior to the first CHG shower.  You may shave your face.  Please follow these instructions carefully:   1.  Shower with CHG Soap the night before surgery and the     morning of Surgery.  2.  If you choose to wash your hair,  wash your hair first as usual with your   normal shampoo.  3.  After you shampoo, rinse your hair and body thoroughly to remove the  Shampoo.  4.  Use CHG as you would any other liquid soap.  You can apply chg directly   to the skin and wash gently with scrungie or a clean washcloth.  5.  Apply the CHG Soap to your body ONLY FROM THE NECK DOWN.   Do not use on open wounds or open sores.  Avoid contact with your eyes,  ears, mouth and genitals (private parts).  Wash genitals (private parts)  with your normal soap.  6.  Wash thoroughly, paying special attention to the area where your surgery  will be performed.  7.  Thoroughly rinse your body with warm water from the neck down.  8.  DO NOT shower/wash with your normal soap after using and rinsing off   the CHG Soap.  9.  Pat yourself dry with a clean towel.            10.  Wear clean pajamas.            11.  Place clean sheets on your bed the night of your first  shower and do not   sleep with pets.  Day of Surgery  Do not apply any lotions/deoderants the morning of surgery.  Please wear clean clothes to the hospital/surgery center.     Please read over the following fact sheets that you were given: Pain Booklet, Coughing and Deep Breathing, Blood Transfusion Information and Surgical Site Infection Prevention

## 2013-12-12 ENCOUNTER — Ambulatory Visit
Admission: RE | Admit: 2013-12-12 | Discharge: 2013-12-12 | Disposition: A | Discharge status: Home / Self Care | Payer: 59 | Source: Ambulatory Visit | Attending: Specialist | Admitting: Specialist

## 2013-12-12 VITALS — BP 106/68 | HR 90

## 2013-12-12 DIAGNOSIS — R200 Anesthesia of skin: Secondary | ICD-10-CM

## 2013-12-12 DIAGNOSIS — M960 Pseudarthrosis after fusion or arthrodesis: Secondary | ICD-10-CM

## 2013-12-12 MED ORDER — IOHEXOL 300 MG/ML  SOLN
10.0000 mL | Freq: Once | INTRAMUSCULAR | Status: AC | PRN
  Administered 2013-12-12: 10 mL via INTRATHECAL

## 2013-12-12 MED ORDER — OXYCODONE-ACETAMINOPHEN 5-325 MG PO TABS
2.0000 | ORAL_TABLET | Freq: Once | ORAL | Status: AC
  Administered 2013-12-12: 2 via ORAL

## 2013-12-12 MED ORDER — DIAZEPAM 5 MG PO TABS
10.0000 mg | ORAL_TABLET | Freq: Once | ORAL | Status: AC
  Administered 2013-12-12: 10 mg via ORAL

## 2013-12-12 NOTE — Discharge Instructions (Signed)
Myelogram Discharge Instructions  1. Go home and rest quietly for the next 24 hours.  It is important to lie flat for the next 24 hours.  Get up only to go to the restroom.  You may lie in the bed or on a couch on your back, your stomach, your left side or your right side.  You may have one pillow under your head.  You may have pillows between your knees while you are on your side or under your knees while you are on your back.  2. DO NOT drive today.  Recline the seat as far back as it will go, while still wearing your seat belt, on the way home.  3. You may get up to go to the bathroom as needed.  You may sit up for 10 minutes to eat.  You may resume your normal diet and medications unless otherwise indicated.  Drink lots of extra fluids today and tomorrow.  4. The incidence of headache, nausea, or vomiting is about 5% (one in 20 patients).  If you develop a headache, lie flat and drink plenty of fluids until the headache goes away.  Caffeinated beverages may be helpful.  If you develop severe nausea and vomiting or a headache that does not go away with flat bed rest, call [phone].  5. You may resume normal activities after your 24 hours of bed rest is over; however, do not exert yourself strongly or do any heavy lifting tomorrow. If when you get up you have a headache when standing, go back to bed and force fluids for another 24 hours.  6. Call your physician for a follow-up appointment.  The results of your myelogram will be sent directly to your physician by the following day.  7. If you have any questions or if complications develop after you arrive home, please call [phone].  Discharge instructions have been explained to the patient.  The patient, or the person responsible for the patient, fully understands these instructions.      May resume Nortriptyline on Dec 13, 2012, after 11:00 am.

## 2013-12-12 NOTE — Progress Notes (Signed)
Pt c/o severe headache with photosensitivity. Skin warm and dry. Cold pack to neck and forehead. Also had pt sit up to allow contrast to drain out of neck to see if this would help as he is very sensitive and has had this before with his other myelograms.

## 2013-12-12 NOTE — Progress Notes (Addendum)
Pt states he has been off nortriptyline for the past 2 days. Discharge instructions explained to pt.

## 2013-12-13 ENCOUNTER — Ambulatory Visit: Payer: 59 | Admitting: Neurology

## 2013-12-13 NOTE — H&P (Signed)
[facility] is an 53 y.o. male.   Chief Complaint: neck pain HPI:  He is about 1-1/2 years out from having undergone a revision of anterior cervical spine surgery C4-5 through C7-T1 with removal of plate and screws due to problems with acute dysphagia.  He had foraminotomies performed along the left side at C5-6, C6-7, and C7-T1.  He had then a posterior fusion performed with lateral mass screws and rods.  He went on to heal and fuse this area.  Reports that he is still experiencing discomfort into his left arm.  Exam shows his motor in the left upper extremity is consistent with a left C7 radiculopathy with weakness in finger extension and left wrist volar flexion.  His triceps strength though is good.  Shoulder abduction strength and biceps strength are normal.  Finger extension or intrinsic motor is intact. Patient's myelogram and post-myelogram CT scan shows that he has some persistent, mild foraminal entrapment along the left side at C7 to the C6-7 level, and this probably should be decompressed at the time of removal of hardware from C5 to T1.   Past Medical History  Diagnosis Date  . Back pain   . Spider bite 2012  . COPD (chronic obstructive pulmonary disease)   . Dysphagia     patient reports d/t surgery, has modified diet, has to tilt head to swallow   . History of blood transfusion 2014    post op bleed  . Depression   . Coronary artery disease     Past Surgical History  Procedure Laterality Date  . Back surgery    . Lumbar fusion  2000  . Cholecystectomy  2009  . Anterior cervical decomp/discectomy fusion  12/22/2011    Procedure: ANTERIOR CERVICAL DECOMPRESSION/DISCECTOMY FUSION 3 LEVELS;  Surgeon: Melina Schools, MD;  Location: [facility];  Service: Orthopedics;  Laterality: N/A;  ACDF C5-T1  . Anterior cervical decomp/discectomy fusion  06/25/2012    Procedure: ANTERIOR CERVICAL DECOMPRESSION/DISCECTOMY FUSION 1 LEVEL/HARDWARE REMOVAL;  Surgeon: [name], MD;  Location: [facility];  Service: Orthopedics;  Laterality: N/A;  Removal anterior cervical plate C5-T1, Explore Fusion, Left C5-6, C6-7, C7-T1 Re-do Foraminotomy, Left open carpal tunnel release with release of ulnar nerve at [facility], Posterior Cervical Fusion with lateral mass screw  . Carpal tunnel release  06/25/2012    Procedure: CARPAL TUNNEL RELEASE;  Surgeon: [name], MD;  Location: [facility];  Service: Orthopedics;  Laterality: Left;  as above  . Posterior cervical fusion/foraminotomy  06/25/2012    Procedure: POSTERIOR CERVICAL FUSION/FORAMINOTOMY LEVEL 1;  Surgeon: [name], MD;  Location: [facility];  Service: Orthopedics;  Laterality: N/A;  as above  . Cardiac catheterization    . Coronary angioplasty  2001    Forestine Na    Family History  Problem Relation Age of Onset  . Anesthesia problems Neg Hx    Social History:  reports that he quit smoking about a year ago. His smoking use included Cigarettes. He started smoking about 12 months ago. He has a 30 pack-year smoking history. He has never used smokeless tobacco. He reports that he does not drink alcohol or use illicit drugs.  Allergies: No Known Allergies  Medications Prior to Admission  Medication Sig Dispense Refill  . clonazePAM (KLONOPIN) 0.5 MG tablet Take 0.5 mg by mouth 2 (two) times daily as needed for anxiety.      . methocarbamol (ROBAXIN) 500 MG tablet Take 500 mg by mouth every 6 (  six) hours as needed for muscle spasms.      Marland Kitchen morphine (MSIR) 15 MG tablet Take 15 mg by mouth every 6 (six) hours as needed for moderate pain or severe pain.      . naproxen (NAPROSYN) 500 MG tablet Take 1 tablet (500 mg total) by mouth 2 (two) times daily.  30 tablet  0  . nortriptyline (PAMELOR) 10 MG capsule Take 10 mg by mouth 2 (two) times daily.       . OxyCODONE (OXYCONTIN) 20 mg T12A Take 1 tablet (20 mg total) by mouth every 12 (twelve) hours.  15 tablet  0  . oxyCODONE-acetaminophen (PERCOCET) 7.5-325 MG per tablet Take 1 tablet by mouth  every 6 (six) hours as needed for pain.  30 tablet  0  . tadalafil (CIALIS) 5 MG tablet Take 1 tablet (5 mg total) by mouth daily.  30 tablet  6  . zolpidem (AMBIEN) 10 MG tablet Take 10 mg by mouth at bedtime as needed for sleep.        No results found for this or any previous visit (from the past 48 hour(s)). No results found.  Review of Systems  Constitutional: Negative.   Respiratory: Negative.   Cardiovascular: Negative.   Gastrointestinal: Negative.   Genitourinary: Negative.   Musculoskeletal: Positive for myalgias and neck pain.  Skin: Negative.   Neurological: Positive for headaches.  Psychiatric/Behavioral: The patient is nervous/anxious.     Blood pressure 122/80, pulse 85, temperature 98.4 F (36.9 C), temperature source Oral, resp. rate 20, height 6' (1.829 m), weight 76.658 kg (169 lb), SpO2 97.00%. Physical Exam  Constitutional: He is oriented to person, place, and time. He appears well-developed and well-nourished.  HENT:  Head: Normocephalic and atraumatic.  Eyes: EOM are normal. Pupils are equal, round, and reactive to light.  Cardiovascular: Normal rate and regular rhythm.   Respiratory: Effort normal.  GI: Soft.  Musculoskeletal:   motor in the left upper extremity is consistent with a left C7 radiculopathy with weakness in finger extension and left wrist volar flexion.  His triceps strength though is good.  Shoulder abduction strength and biceps strength are normal.  Finger extension or intrinsic motor is intact.    Neurological: He is alert and oriented to person, place, and time.  Skin: Skin is warm and dry.     Assessment/Plan Painful retained hardware posterior cervical spine C5-T1  PLAN:  Removal of posterior cervical lateral mass screw and rods with exploration of fusion C5-T1  [name] 12/17/2013, 7:22 AM

## 2013-12-16 ENCOUNTER — Other Ambulatory Visit (HOSPITAL_COMMUNITY): Payer: Self-pay | Admitting: Specialist

## 2013-12-16 MED ORDER — CHLORHEXIDINE GLUCONATE 4 % EX LIQD
60.0000 mL | Freq: Once | CUTANEOUS | Status: DC
  Filled 2013-12-16: qty 60

## 2013-12-16 MED ORDER — CEFAZOLIN SODIUM-DEXTROSE 2-3 GM-% IV SOLR
2.0000 g | INTRAVENOUS | Status: AC
  Administered 2013-12-17: 2 g via INTRAVENOUS
  Filled 2013-12-16: qty 50

## 2013-12-17 ENCOUNTER — Ambulatory Visit (HOSPITAL_COMMUNITY): Payer: 59 | Admitting: Anesthesiology

## 2013-12-17 ENCOUNTER — Encounter (HOSPITAL_COMMUNITY)
Admission: RE | Disposition: A | Discharge status: Home / Self Care | Payer: Self-pay | Source: Ambulatory Visit | Attending: Specialist

## 2013-12-17 ENCOUNTER — Encounter (HOSPITAL_COMMUNITY): Payer: Self-pay

## 2013-12-17 ENCOUNTER — Encounter (HOSPITAL_COMMUNITY): Payer: 59 | Admitting: Anesthesiology

## 2013-12-17 ENCOUNTER — Observation Stay (HOSPITAL_COMMUNITY)
Admission: RE | Admit: 2013-12-17 | Discharge: 2013-12-18 | Disposition: A | Discharge status: Home / Self Care | Payer: 59 | Source: Ambulatory Visit | Attending: Specialist | Admitting: Specialist

## 2013-12-17 DIAGNOSIS — M4802 Spinal stenosis, cervical region: Secondary | ICD-10-CM | POA: Insufficient documentation

## 2013-12-17 DIAGNOSIS — Z87891 Personal history of nicotine dependence: Secondary | ICD-10-CM | POA: Insufficient documentation

## 2013-12-17 DIAGNOSIS — M4722 Other spondylosis with radiculopathy, cervical region: Secondary | ICD-10-CM | POA: Diagnosis present

## 2013-12-17 DIAGNOSIS — Y831 Surgical operation with implant of artificial internal device as the cause of abnormal reaction of the patient, or of later complication, without mention of misadventure at the time of the procedure: Secondary | ICD-10-CM | POA: Insufficient documentation

## 2013-12-17 DIAGNOSIS — J4489 Other specified chronic obstructive pulmonary disease: Secondary | ICD-10-CM | POA: Insufficient documentation

## 2013-12-17 DIAGNOSIS — F329 Major depressive disorder, single episode, unspecified: Secondary | ICD-10-CM | POA: Insufficient documentation

## 2013-12-17 DIAGNOSIS — J449 Chronic obstructive pulmonary disease, unspecified: Secondary | ICD-10-CM | POA: Insufficient documentation

## 2013-12-17 DIAGNOSIS — Z981 Arthrodesis status: Secondary | ICD-10-CM | POA: Insufficient documentation

## 2013-12-17 DIAGNOSIS — F3289 Other specified depressive episodes: Secondary | ICD-10-CM | POA: Insufficient documentation

## 2013-12-17 DIAGNOSIS — T8489XA Other specified complication of internal orthopedic prosthetic devices, implants and grafts, initial encounter: Principal | ICD-10-CM | POA: Insufficient documentation

## 2013-12-17 DIAGNOSIS — I2510 Atherosclerotic heart disease of native coronary artery without angina pectoris: Secondary | ICD-10-CM | POA: Insufficient documentation

## 2013-12-17 HISTORY — PX: POSTERIOR CERVICAL FUSION/FORAMINOTOMY: SHX5038

## 2013-12-17 SURGERY — POSTERIOR CERVICAL FUSION/FORAMINOTOMY LEVEL 1
Anesthesia: General

## 2013-12-17 MED ORDER — LIDOCAINE HCL (CARDIAC) 20 MG/ML IV SOLN
INTRAVENOUS | Status: AC
  Filled 2013-12-17: qty 5

## 2013-12-17 MED ORDER — ARTIFICIAL TEARS OP OINT
TOPICAL_OINTMENT | OPHTHALMIC | Status: AC
  Filled 2013-12-17: qty 3.5

## 2013-12-17 MED ORDER — DEXTROSE 5 % IV SOLN
INTRAVENOUS | Status: DC | PRN
  Administered 2013-12-17: 08:00:00 via INTRAVENOUS

## 2013-12-17 MED ORDER — OXYCODONE HCL ER 40 MG PO T12A
40.0000 mg | EXTENDED_RELEASE_TABLET | ORAL | Status: AC
  Administered 2013-12-17: 40 mg via ORAL

## 2013-12-17 MED ORDER — DEXAMETHASONE SODIUM PHOSPHATE 10 MG/ML IJ SOLN
INTRAMUSCULAR | Status: DC | PRN
  Administered 2013-12-17: 10 mg via INTRAVENOUS

## 2013-12-17 MED ORDER — CLONAZEPAM 0.5 MG PO TABS
0.5000 mg | ORAL_TABLET | Freq: Two times a day (BID) | ORAL | Status: DC | PRN
  Administered 2013-12-17 – 2013-12-18 (×2): 0.5 mg via ORAL
  Filled 2013-12-17 (×2): qty 1

## 2013-12-17 MED ORDER — SODIUM CHLORIDE 0.9 % IJ SOLN
3.0000 mL | Freq: Two times a day (BID) | INTRAMUSCULAR | Status: DC
  Administered 2013-12-17: 3 mL via INTRAVENOUS

## 2013-12-17 MED ORDER — SODIUM CHLORIDE 0.9 % IV SOLN
10.0000 mg | INTRAVENOUS | Status: DC | PRN
  Administered 2013-12-17: 20 ug/min via INTRAVENOUS

## 2013-12-17 MED ORDER — BISACODYL 10 MG RE SUPP: 10.0000 mg | Freq: Every day | RECTAL | Status: DC | PRN

## 2013-12-17 MED ORDER — MAGNESIUM CITRATE PO SOLN
1.0000 | Freq: Once | ORAL | Status: AC | PRN
  Filled 2013-12-17: qty 296

## 2013-12-17 MED ORDER — OXYCODONE-ACETAMINOPHEN 5-325 MG PO TABS
ORAL_TABLET | ORAL | Status: AC
  Administered 2013-12-17: 2 via ORAL
  Filled 2013-12-17: qty 2

## 2013-12-17 MED ORDER — HEMOSTATIC AGENTS (NO CHARGE) OPTIME
TOPICAL | Status: DC | PRN
  Administered 2013-12-17: 1 via TOPICAL

## 2013-12-17 MED ORDER — THROMBIN 5000 UNITS EX SOLR
CUTANEOUS | Status: DC | PRN
  Administered 2013-12-17: 5000 [IU] via TOPICAL

## 2013-12-17 MED ORDER — FENTANYL CITRATE 0.05 MG/ML IJ SOLN
INTRAMUSCULAR | Status: AC
  Filled 2013-12-17: qty 5

## 2013-12-17 MED ORDER — OXYCODONE HCL 5 MG PO TABS
ORAL_TABLET | ORAL | Status: AC
  Filled 2013-12-17: qty 1

## 2013-12-17 MED ORDER — SODIUM CHLORIDE 0.9 % IJ SOLN: 3.0000 mL | INTRAMUSCULAR | Status: DC | PRN

## 2013-12-17 MED ORDER — MENTHOL 3 MG MT LOZG: 1.0000 | LOZENGE | OROMUCOSAL | Status: DC | PRN

## 2013-12-17 MED ORDER — MIDAZOLAM HCL 2 MG/2ML IJ SOLN
INTRAMUSCULAR | Status: AC
  Filled 2013-12-17: qty 2

## 2013-12-17 MED ORDER — MORPHINE SULFATE 2 MG/ML IJ SOLN
1.0000 mg | INTRAMUSCULAR | Status: DC | PRN
  Administered 2013-12-17 – 2013-12-18 (×4): 4 mg via INTRAVENOUS
  Filled 2013-12-17 (×3): qty 2

## 2013-12-17 MED ORDER — OXYCODONE HCL 5 MG/5ML PO SOLN: 5.0000 mg | Freq: Once | ORAL | Status: AC | PRN

## 2013-12-17 MED ORDER — ONDANSETRON HCL 4 MG/2ML IJ SOLN: 4.0000 mg | INTRAMUSCULAR | Status: DC | PRN

## 2013-12-17 MED ORDER — ACETAMINOPHEN 325 MG PO TABS: 650.0000 mg | ORAL_TABLET | ORAL | Status: DC | PRN

## 2013-12-17 MED ORDER — OXYCODONE HCL 5 MG PO TABS
5.0000 mg | ORAL_TABLET | Freq: Once | ORAL | Status: AC | PRN
  Administered 2013-12-17: 5 mg via ORAL

## 2013-12-17 MED ORDER — SODIUM CHLORIDE 0.9 % IV SOLN
INTRAVENOUS | Status: DC
  Administered 2013-12-17: 1000 mL via INTRAVENOUS

## 2013-12-17 MED ORDER — KCL IN DEXTROSE-NACL 40-5-0.9 MEQ/L-%-% IV SOLN
INTRAVENOUS | Status: DC
  Filled 2013-12-17: qty 1000

## 2013-12-17 MED ORDER — SODIUM CHLORIDE 0.9 % IV SOLN: 250.0000 mL | INTRAVENOUS | Status: DC

## 2013-12-17 MED ORDER — MORPHINE SULFATE 4 MG/ML IJ SOLN
INTRAMUSCULAR | Status: AC
  Administered 2013-12-17: 4 mg
  Filled 2013-12-17: qty 1

## 2013-12-17 MED ORDER — METHOCARBAMOL 500 MG PO TABS
500.0000 mg | ORAL_TABLET | Freq: Four times a day (QID) | ORAL | Status: DC | PRN
  Administered 2013-12-17: 500 mg via ORAL
  Filled 2013-12-17: qty 1

## 2013-12-17 MED ORDER — VECURONIUM BROMIDE 10 MG IV SOLR
INTRAVENOUS | Status: AC
  Filled 2013-12-17: qty 10

## 2013-12-17 MED ORDER — KETOROLAC TROMETHAMINE 30 MG/ML IJ SOLN
INTRAMUSCULAR | Status: AC
  Filled 2013-12-17: qty 1

## 2013-12-17 MED ORDER — KCL IN DEXTROSE-NACL 10-5-0.45 MEQ/L-%-% IV SOLN
INTRAVENOUS | Status: DC
  Filled 2013-12-17: qty 1000

## 2013-12-17 MED ORDER — ALBUMIN HUMAN 5 % IV SOLN
INTRAVENOUS | Status: DC | PRN
  Administered 2013-12-17: 09:00:00 via INTRAVENOUS

## 2013-12-17 MED ORDER — ONDANSETRON HCL 4 MG/2ML IJ SOLN
INTRAMUSCULAR | Status: AC
  Filled 2013-12-17: qty 2

## 2013-12-17 MED ORDER — METHOCARBAMOL 500 MG PO TABS
ORAL_TABLET | ORAL | Status: AC
  Filled 2013-12-17: qty 1

## 2013-12-17 MED ORDER — HYDROMORPHONE HCL PF 1 MG/ML IJ SOLN
0.2500 mg | INTRAMUSCULAR | Status: DC | PRN
  Administered 2013-12-17: 1 mg via INTRAVENOUS
  Administered 2013-12-17 (×2): 0.5 mg via INTRAVENOUS

## 2013-12-17 MED ORDER — TADALAFIL 5 MG PO TABS: 5.0000 mg | ORAL_TABLET | Freq: Every day | ORAL | Status: DC

## 2013-12-17 MED ORDER — LIDOCAINE HCL (CARDIAC) 20 MG/ML IV SOLN
INTRAVENOUS | Status: DC | PRN
  Administered 2013-12-17: 50 mg via INTRAVENOUS
  Administered 2013-12-17: 80 mg via INTRAVENOUS

## 2013-12-17 MED ORDER — THROMBIN 5000 UNITS EX SOLR
CUTANEOUS | Status: AC
  Filled 2013-12-17: qty 10000

## 2013-12-17 MED ORDER — PROPOFOL 10 MG/ML IV BOLUS
INTRAVENOUS | Status: AC
  Filled 2013-12-17: qty 20

## 2013-12-17 MED ORDER — BUPIVACAINE-EPINEPHRINE 0.5% -1:200000 IJ SOLN
INTRAMUSCULAR | Status: DC | PRN
  Administered 2013-12-17: 10 mL

## 2013-12-17 MED ORDER — CEFAZOLIN SODIUM 1-5 GM-% IV SOLN
1.0000 g | Freq: Three times a day (TID) | INTRAVENOUS | Status: AC
  Administered 2013-12-17 – 2013-12-18 (×2): 1 g via INTRAVENOUS
  Filled 2013-12-17 (×3): qty 50

## 2013-12-17 MED ORDER — NEOSTIGMINE METHYLSULFATE 10 MG/10ML IV SOLN
INTRAVENOUS | Status: AC
  Filled 2013-12-17: qty 3

## 2013-12-17 MED ORDER — GLYCOPYRROLATE 0.2 MG/ML IJ SOLN
INTRAMUSCULAR | Status: AC
  Filled 2013-12-17: qty 3

## 2013-12-17 MED ORDER — PHENOL 1.4 % MT LIQD
1.0000 | OROMUCOSAL | Status: DC | PRN
Start: 2013-12-17 — End: 2013-12-18

## 2013-12-17 MED ORDER — METHOCARBAMOL 1000 MG/10ML IJ SOLN
500.0000 mg | Freq: Four times a day (QID) | INTRAVENOUS | Status: DC | PRN
  Filled 2013-12-17: qty 5

## 2013-12-17 MED ORDER — DEXAMETHASONE SODIUM PHOSPHATE 10 MG/ML IJ SOLN
INTRAMUSCULAR | Status: AC
  Filled 2013-12-17: qty 1

## 2013-12-17 MED ORDER — ACETAMINOPHEN 650 MG RE SUPP: 650.0000 mg | RECTAL | Status: DC | PRN

## 2013-12-17 MED ORDER — KETOROLAC TROMETHAMINE 30 MG/ML IJ SOLN
30.0000 mg | Freq: Once | INTRAMUSCULAR | Status: AC
  Administered 2013-12-17: 30 mg via INTRAVENOUS

## 2013-12-17 MED ORDER — ARTIFICIAL TEARS OP OINT
TOPICAL_OINTMENT | OPHTHALMIC | Status: DC | PRN
Start: 2013-12-17 — End: 2013-12-17
  Administered 2013-12-17: 1 via OPHTHALMIC

## 2013-12-17 MED ORDER — GLYCOPYRROLATE 0.2 MG/ML IJ SOLN
INTRAMUSCULAR | Status: DC | PRN
  Administered 2013-12-17: 0.6 mg via INTRAVENOUS

## 2013-12-17 MED ORDER — HYDROCODONE-ACETAMINOPHEN 5-325 MG PO TABS
1.0000 | ORAL_TABLET | ORAL | Status: DC | PRN
  Administered 2013-12-18: 2 via ORAL
  Filled 2013-12-17: qty 2

## 2013-12-17 MED ORDER — NEOSTIGMINE METHYLSULFATE 10 MG/10ML IV SOLN
INTRAVENOUS | Status: DC | PRN
  Administered 2013-12-17: 5 mg via INTRAVENOUS

## 2013-12-17 MED ORDER — METHOCARBAMOL 500 MG PO TABS
ORAL_TABLET | ORAL | Status: AC
  Administered 2013-12-17: 500 mg
  Filled 2013-12-17: qty 1

## 2013-12-17 MED ORDER — VECURONIUM BROMIDE 10 MG IV SOLR
INTRAVENOUS | Status: DC | PRN
  Administered 2013-12-17: 2 mg via INTRAVENOUS
  Administered 2013-12-17: 4 mg via INTRAVENOUS

## 2013-12-17 MED ORDER — THROMBIN 5000 UNITS EX SOLR
CUTANEOUS | Status: AC
  Filled 2013-12-17: qty 5000

## 2013-12-17 MED ORDER — POLYETHYLENE GLYCOL 3350 17 G PO PACK
17.0000 g | PACK | Freq: Every day | ORAL | Status: DC | PRN
Start: 2013-12-17 — End: 2013-12-18
  Filled 2013-12-17: qty 1

## 2013-12-17 MED ORDER — MIDAZOLAM HCL 5 MG/5ML IJ SOLN
INTRAMUSCULAR | Status: DC | PRN
  Administered 2013-12-17: 2 mg via INTRAVENOUS

## 2013-12-17 MED ORDER — STERILE WATER FOR INJECTION IJ SOLN
INTRAMUSCULAR | Status: AC
Start: 2013-12-17 — End: 2013-12-17
  Filled 2013-12-17: qty 10

## 2013-12-17 MED ORDER — HYDROMORPHONE HCL PF 1 MG/ML IJ SOLN
INTRAMUSCULAR | Status: AC
  Administered 2013-12-17: 0.5 mg via INTRAVENOUS
  Filled 2013-12-17: qty 1

## 2013-12-17 MED ORDER — PROPOFOL 10 MG/ML IV BOLUS
INTRAVENOUS | Status: DC | PRN
  Administered 2013-12-17: 200 mg via INTRAVENOUS

## 2013-12-17 MED ORDER — STERILE WATER FOR INJECTION IJ SOLN
INTRAMUSCULAR | Status: AC
  Filled 2013-12-17: qty 10

## 2013-12-17 MED ORDER — THROMBIN 5000 UNITS EX SOLR
CUTANEOUS | Status: DC | PRN
  Administered 2013-12-17: 09:00:00 via TOPICAL

## 2013-12-17 MED ORDER — ZOLPIDEM TARTRATE 5 MG PO TABS: 10.0000 mg | ORAL_TABLET | Freq: Every evening | ORAL | Status: DC | PRN

## 2013-12-17 MED ORDER — FENTANYL CITRATE 0.05 MG/ML IJ SOLN
INTRAMUSCULAR | Status: DC | PRN
  Administered 2013-12-17: 100 ug via INTRAVENOUS
  Administered 2013-12-17 (×4): 50 ug via INTRAVENOUS
  Administered 2013-12-17: 100 ug via INTRAVENOUS
  Administered 2013-12-17 (×2): 50 ug via INTRAVENOUS

## 2013-12-17 MED ORDER — DOCUSATE SODIUM 100 MG PO CAPS
100.0000 mg | ORAL_CAPSULE | Freq: Two times a day (BID) | ORAL | Status: DC
  Administered 2013-12-17: 100 mg via ORAL
  Filled 2013-12-17: qty 1

## 2013-12-17 MED ORDER — ONDANSETRON HCL 4 MG/2ML IJ SOLN
INTRAMUSCULAR | Status: DC | PRN
  Administered 2013-12-17: 4 mg via INTRAVENOUS

## 2013-12-17 MED ORDER — OXYCODONE-ACETAMINOPHEN 5-325 MG PO TABS
1.0000 | ORAL_TABLET | ORAL | Status: DC | PRN
  Administered 2013-12-17 – 2013-12-18 (×2): 2 via ORAL
  Filled 2013-12-17: qty 2

## 2013-12-17 MED ORDER — NORTRIPTYLINE HCL 10 MG PO CAPS
10.0000 mg | ORAL_CAPSULE | Freq: Two times a day (BID) | ORAL | Status: DC
  Administered 2013-12-17 – 2013-12-18 (×2): 10 mg via ORAL
  Filled 2013-12-17 (×3): qty 1

## 2013-12-17 MED ORDER — OXYCODONE HCL ER 40 MG PO T12A
40.0000 mg | EXTENDED_RELEASE_TABLET | Freq: Two times a day (BID) | ORAL | Status: DC
  Administered 2013-12-18: 40 mg via ORAL
  Filled 2013-12-17: qty 1

## 2013-12-17 MED ORDER — BUPIVACAINE-EPINEPHRINE (PF) 0.5% -1:200000 IJ SOLN
INTRAMUSCULAR | Status: AC
  Filled 2013-12-17: qty 30

## 2013-12-17 MED ORDER — ROCURONIUM BROMIDE 50 MG/5ML IV SOLN
INTRAVENOUS | Status: AC
  Filled 2013-12-17: qty 1

## 2013-12-17 MED ORDER — ROCURONIUM BROMIDE 100 MG/10ML IV SOLN
INTRAVENOUS | Status: DC | PRN
  Administered 2013-12-17: 50 mg via INTRAVENOUS

## 2013-12-17 MED ORDER — ONDANSETRON HCL 4 MG/2ML IJ SOLN: 4.0000 mg | Freq: Four times a day (QID) | INTRAMUSCULAR | Status: DC | PRN

## 2013-12-17 MED ORDER — MORPHINE SULFATE 4 MG/ML IJ SOLN
INTRAMUSCULAR | Status: AC
  Filled 2013-12-17: qty 1

## 2013-12-17 MED ORDER — LACTATED RINGERS IV SOLN
INTRAVENOUS | Status: DC | PRN
  Administered 2013-12-17 (×2): via INTRAVENOUS

## 2013-12-17 SURGICAL SUPPLY — 55 items
ADH SKN CLS APL DERMABOND .7 (GAUZE/BANDAGES/DRESSINGS) ×1
BLADE SURG ROTATE 9660 (MISCELLANEOUS) IMPLANT
BUR MATCHSTICK NEURO 3.0 LAGG (BURR) ×3 IMPLANT
BUR ROUND FLUTED 4 SOFT TCH (BURR) ×1 IMPLANT
BUR ROUND FLUTED 4MM SOFT TCH (BURR) ×1
BUR SABER RD CUTTING 3.0 (BURR) IMPLANT
BUR SABER RD CUTTING 3.0MM (BURR)
COLLAR CERV LO CONTOUR FIRM DE (SOFTGOODS) ×3 IMPLANT
CORDS BIPOLAR (ELECTRODE) ×3 IMPLANT
COVER SURGICAL LIGHT HANDLE (MISCELLANEOUS) ×3 IMPLANT
DERMABOND ADVANCED (GAUZE/BANDAGES/DRESSINGS) ×2
DERMABOND ADVANCED .7 DNX12 (GAUZE/BANDAGES/DRESSINGS) ×1 IMPLANT
DRAPE C-ARM 42X72 X-RAY (DRAPES) ×4 IMPLANT
DRAPE INCISE IOBAN 66X45 STRL (DRAPES) IMPLANT
DRAPE LAPAROTOMY T 102X78X121 (DRAPES) ×3 IMPLANT
DRAPE MICROSCOPE LEICA (MISCELLANEOUS) ×2 IMPLANT
DRAPE PROXIMA HALF (DRAPES) ×6 IMPLANT
DRAPE SURG 17X23 STRL (DRAPES) ×10 IMPLANT
DRSG MEPILEX BORDER 4X4 (GAUZE/BANDAGES/DRESSINGS) IMPLANT
DRSG MEPILEX BORDER 4X8 (GAUZE/BANDAGES/DRESSINGS) ×2 IMPLANT
DURAPREP 26ML APPLICATOR (WOUND CARE) ×3 IMPLANT
ELECT COATED BLADE 2.86 ST (ELECTRODE) ×3 IMPLANT
ELECT REM PT RETURN 9FT ADLT (ELECTROSURGICAL) ×3
ELECTRODE REM PT RTRN 9FT ADLT (ELECTROSURGICAL) ×1 IMPLANT
EVACUATOR 1/8 PVC DRAIN (DRAIN) IMPLANT
GLOVE BIOGEL PI IND STRL 7.5 (GLOVE) ×1 IMPLANT
GLOVE BIOGEL PI INDICATOR 7.5 (GLOVE) ×2
GLOVE ECLIPSE 7.0 STRL STRAW (GLOVE) ×3 IMPLANT
GLOVE ECLIPSE 8.5 STRL (GLOVE) ×3 IMPLANT
GLOVE SURG 8.5 LATEX PF (GLOVE) ×3 IMPLANT
GOWN STRL REUS W/ TWL LRG LVL3 (GOWN DISPOSABLE) ×2 IMPLANT
GOWN STRL REUS W/TWL 2XL LVL3 (GOWN DISPOSABLE) ×3 IMPLANT
GOWN STRL REUS W/TWL LRG LVL3 (GOWN DISPOSABLE) ×6
KIT BASIN OR (CUSTOM PROCEDURE TRAY) ×3 IMPLANT
KIT ROOM TURNOVER OR (KITS) ×3 IMPLANT
MANIFOLD NEPTUNE II (INSTRUMENTS) ×3 IMPLANT
NS IRRIG 1000ML POUR BTL (IV SOLUTION) ×3 IMPLANT
PACK ORTHO CERVICAL (CUSTOM PROCEDURE TRAY) ×3 IMPLANT
PAD ARMBOARD 7.5X6 YLW CONV (MISCELLANEOUS) ×10 IMPLANT
PATTIES SURGICAL .25X.25 (GAUZE/BANDAGES/DRESSINGS) ×3 IMPLANT
PATTIES SURGICAL .75X.75 (GAUZE/BANDAGES/DRESSINGS) ×3 IMPLANT
SPONGE GAUZE 4X4 12PLY (GAUZE/BANDAGES/DRESSINGS) ×3 IMPLANT
SURGIFLO W/THROMBIN 8M KIT (HEMOSTASIS) ×2 IMPLANT
SUT ETHIBOND CT1 BRD #0 30IN (SUTURE) ×3 IMPLANT
SUT VIC AB 0 CT1 27 (SUTURE)
SUT VIC AB 0 CT1 27XBRD ANBCTR (SUTURE) IMPLANT
SUT VIC AB 2-0 CT1 27 (SUTURE) ×3
SUT VIC AB 2-0 CT1 TAPERPNT 27 (SUTURE) IMPLANT
SUT VIC AB 2-0 UR6 27 (SUTURE) ×2 IMPLANT
SUT VICRYL 0 UR6 27IN ABS (SUTURE) ×3 IMPLANT
SUT VICRYL 4-0 PS2 18IN ABS (SUTURE) ×3 IMPLANT
TOWEL OR 17X24 6PK STRL BLUE (TOWEL DISPOSABLE) ×5 IMPLANT
TOWEL OR 17X26 10 PK STRL BLUE (TOWEL DISPOSABLE) ×3 IMPLANT
TRAY FOLEY CATH 16FRSI W/METER (SET/KITS/TRAYS/PACK) IMPLANT
WATER STERILE IRR 1000ML POUR (IV SOLUTION) ×3 IMPLANT

## 2013-12-17 NOTE — Anesthesia Preprocedure Evaluation (Signed)
Anesthesia Evaluation  Patient identified by MRN, date of birth, ID band Patient awake    Reviewed: Allergy & Precautions, H&P , NPO status , Patient's Chart, lab work & pertinent test results  Airway Mallampati: II  Neck ROM: limited    Dental   Pulmonary COPDformer smoker,          Cardiovascular + CAD     Neuro/Psych Depression  Neuromuscular disease    GI/Hepatic   Endo/Other    Renal/GU      Musculoskeletal   Abdominal   Peds  Hematology   Anesthesia Other Findings   Reproductive/Obstetrics                           Anesthesia Physical Anesthesia Plan  ASA: II  Anesthesia Plan: General   Post-op Pain Management:    Induction: Intravenous  Airway Management Planned: Oral ETT  Additional Equipment:   Intra-op Plan:   Post-operative Plan: Extubation in OR  Informed Consent: I have reviewed the patients History and Physical, chart, labs and discussed the procedure including the risks, benefits and alternatives for the proposed anesthesia with the patient or authorized representative who has indicated his/her understanding and acceptance.     Plan Discussed with: CRNA, Anesthesiologist and Surgeon  Anesthesia Plan Comments:         Anesthesia Quick Evaluation

## 2013-12-17 NOTE — Anesthesia Procedure Notes (Addendum)
Procedure Name: Intubation Date/Time: 12/17/2013 7:40 AM Performed by: Jacquiline Doe A Pre-anesthesia Checklist: Patient identified, Timeout performed, Emergency Drugs available, Suction available and Patient being monitored Patient Re-evaluated:Patient Re-evaluated prior to inductionOxygen Delivery Method: Circle system utilized Preoxygenation: Pre-oxygenation with 100% oxygen Intubation Type: IV induction and Cricoid Pressure applied Ventilation: Mask ventilation without difficulty Laryngoscope Size: Mac Grade View: Grade I Tube type: Oral Tube size: 8.0 mm Number of attempts: 1 Airway Equipment and Method: Stylet,  Video-laryngoscopy and LTA kit utilized Placement Confirmation: ETT inserted through vocal cords under direct vision,  breath sounds checked- equal and bilateral and positive ETCO2 Secured at: 22 cm Tube secured with: Tape Dental Injury: Teeth and Oropharynx as per pre-operative assessment

## 2013-12-17 NOTE — Progress Notes (Signed)
Pt has chronic pain and takes several narcotics daily.  Pt says he did not take his oxycontin this a.m.  Given in PACU.

## 2013-12-17 NOTE — Anesthesia Postprocedure Evaluation (Signed)
Anesthesia Post Note  Patient: [facility]  Procedure(s) Performed: Procedure(s) (LRB): REMOVAL OF POSTERIOR CERVICAL FUSION LATERAL MASS SCREWS AND RODS C5-T1, EXPLORE FUSION, LEFT SIX-SEVEN FORAMINOTOMY (N/A)  Anesthesia type: General  Patient location: PACU  Post pain: Pain level controlled and Adequate analgesia  Post assessment: Post-op Vital signs reviewed, Patient's Cardiovascular Status Stable, Respiratory Function Stable, Patent Airway and Pain level controlled  Last Vitals:  Filed Vitals:   12/17/13 1215  BP: 89/51  Pulse: 92  Temp:   Resp: 13    Post vital signs: Reviewed and stable  Level of consciousness: awake, alert  and oriented  Complications: No apparent anesthesia complications

## 2013-12-17 NOTE — Telephone Encounter (Signed)
Taken care of in another encounter 

## 2013-12-17 NOTE — Interval H&P Note (Signed)
History and Physical Interval Note:  12/17/2013 7:23 AM  [facility]  has presented today for surgery, with the diagnosis of Painful, retained hardware posterior C5-T1  The various methods of treatment have been discussed with the patient and family. After consideration of risks, benefits and other options for treatment, the patient has consented to  Procedure(s): REMOVAL OF POSTERIOR CERVICAL FUSION LATERAL MASS SCREWS AND RODS C5-T1, EXPLORE FUSION, LEFT SIX-SEVEN FORAMINOTOMY (N/A) as a surgical intervention .  The patient's history has been reviewed, patient examined, no change in status, stable for surgery.  I have reviewed the patient's chart and labs.  Questions were answered to the patient's satisfaction.     [name]

## 2013-12-17 NOTE — Addendum Note (Signed)
Addendum created 12/17/13 1350 by Fidela Juneau, CRNA   Modules edited: Anesthesia Blocks and Procedures, Clinical Notes   Clinical Notes:  File: [id number]

## 2013-12-17 NOTE — Transfer of Care (Signed)
Immediate Anesthesia Transfer of Care Note  Patient: [facility]  Procedure(s) Performed: Procedure(s): REMOVAL OF POSTERIOR CERVICAL FUSION LATERAL MASS SCREWS AND RODS C5-T1, EXPLORE FUSION, LEFT SIX-SEVEN FORAMINOTOMY (N/A)  Patient Location: PACU  Anesthesia Type:General  Level of Consciousness: oriented, sedated, patient cooperative and responds to stimulation  Airway & Oxygen Therapy: Patient Spontanous Breathing and Patient connected to face mask oxygen  Post-op Assessment: Report given to PACU RN, Post -op Vital signs reviewed and stable, Patient moving all extremities and Patient moving all extremities X 4  Post vital signs: Reviewed and stable  Complications: No apparent anesthesia complications

## 2013-12-17 NOTE — Brief Op Note (Signed)
12/17/2013  10:19 AM  PATIENT:  [facility]  53 y.o. male  PRE-OPERATIVE DIAGNOSIS:  Painful, retained hardware posterior C5-T1  POST-OPERATIVE DIAGNOSIS:  same  PROCEDURE:  Procedure(s): REMOVAL OF POSTERIOR CERVICAL FUSION LATERAL MASS SCREWS AND RODS C5-T1, EXPLORE FUSION, LEFT SIX-SEVEN FORAMINOTOMY (N/A)  SURGEON:  Surgeon(s) and Role:    * [name], MD - Primary  PHYSICIAN ASSISTANT: Phillips Hay, PA-C   ANESTHESIA:   local and general, Dr. Marcie Bal.  EBL:  Total I/O In: 1300 [I.V.:1050; IV Piggyback:250] Out: 200 [Blood:200]  BLOOD ADMINISTERED:none  DRAINS: Urinary Catheter (Foley) In and Out at the end of the case.  LOCAL MEDICATIONS USED:  MARCAINE    and Amount: 10 ml  SPECIMEN:  No Specimen  DISPOSITION OF SPECIMEN:  N/A  COUNTS:  YES  TOURNIQUET:  * No tourniquets in log *  DICTATION: .Dragon Dictation  PLAN OF CARE: Admit for overnight observation  PATIENT DISPOSITION:  PACU - hemodynamically stable.   Delay start of Pharmacological VTE agent (>24hrs) due to surgical blood loss or risk of bleeding: yes

## 2013-12-17 NOTE — Op Note (Signed)
12/17/2013  10:23 AM  PATIENT:  [facility]  53 y.o. 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    PROCEDURE:  Procedure(s): REMOVAL OF POSTERIOR CERVICAL FUSION LATERAL MASS SCREWS AND RODS C5-T1, EXPLORE FUSION, LEFT SIX-SEVEN FORAMINOTOMY    SURGEON:  [name], MD     ASSISTANT:  Phillips Hay, PA-C  (Present throughout the entire procedure and necessary for completion of procedure in a timely manner)     ANESTHESIA:  General,supplemented with local marcaine 1/2% with 1/200,000 epi total 10 cc. Dr. Marcie Bal.    COMPLICATIONS:  None.     COMPONENTS: 7 lateral mass screws and 7 fastener caps and 2 titanium rods removed.  PROCEDURE:The patient was met in the holding area, and the appropriate left C6-7 cervical level identified and marked with "x" and my initials. All questions were answered and informed consent signed. The patient was then transported to OR. The patient was then placed under general anesthesia without difficulty.  Transferred to the operating room table prone position Mayfield horseshoe with Oralia Manis. All pressure points well-padded PAS stockings.Shoulders taped down and skin over the posterior inferior aspect of the neck place in traction to decrease skin folds. The patient received appropriate preoperative antibiotic 2 grams ancef. prophylaxis.Time-out procedure was called and correct.  Sterile prep with DuraPrep and draped in the usual manner the shoulders were taped downwards and skin traction over the skin of the neck. Following DuraPrep draped in the usual manner. After timeout protocol. The old midline incision scar  C4 to T2 was marked for excision and then infiltrated with 10cc of marcaine 1/2% with 1/200,000 epi. The incision through  skin and subcutaneous skin down to the spinous processes of C4 to T2 ellipsing and removing the old incision scar. The previous ethibond sutures were removed and the incision carried along the laminae bilaterally exposing the posterior lateral mass screws and rods. 15 blade scalpel used to expose the hardware bilaterally.A marking pen was used to mark the spinous process of C7 the lamina associated with the lateral mass screw second proximal from the T1 pedicle screws. The posterior hardware then stripped of soft tissue and the scar released off the posterior hardware with scapel hand ronguer and pituitary. The locking screws for the fastener cap screw debrided and the hex screw driver used to remove each of the caps. The rods then further debrided off soft tissue off the rods. The rods then lifed away and each of the screws removed using the insertion screw driver. The fusion then explored and the C5-T1 segments noted to move as a single segment there was no motion between spinous processes or laminae with stressing the posterior elements. Electrocautery then used to carefully incise the cervical muscles off the left lateral aspect of the spinous process of C7 and C6. Dividing the spinous muscles off of the inferior aspect lamina at C6 exposing the C6-7 posterior aspect of the interlaminar space. The magnification headlamp were used during this portion procedure. Boss McCollough retractor was inserted. High-speed bur was used to remove a small portion of bone from the inferior aspect of lamina of C6 and the medial 20% of the inferior articular process of C6. Further thinning the superior aspect of the lamina left C7. A 1 mm Kerrison was then used to remove him from superior aspect of  the lamina C7 and the medial aspect of the infra-articular process of C6 resecting 50%, exposing the superior articular process of C7. A 1 mm Kerrison was removed bone off the medial aspect of the superior articular process of C7  resecting 50% of the medial aspect of the superior articular process of C7. Ligamentum flavum then easily lifted superiorly electrocautery unit cauterizing epidural veins deep to the ligamentum flavum then resecting the ligamentum flavum. The operating room microscope was draped sterilely and brought into the field. Under the operating room microscope the epidural vein layer overlying the posterior aspect of the thecal sac and the C7 nerve root was then carefully lifted using a micro-titanium then cauterized using bipolar electrocautery a # 15 blade scalpel then used to incise this overlying the C7nerve root releasing the vascular leash a backward angle 3-0 microcurette then used to remove a small portion of bone off the superior and medial aspect of the pedicle further mobilizing the C7 nerve root bipolar electrocautery to control all bleeding within the axillary area of the C7 nerve. Bone wax was applied to bleeding cancellus bone surfaces are excellent hemostasis obtained The disc explored using a Penfield 4 found to be protruded. No significant disc material found to be herniated and uncovertebral joint found to be prominent resulting in left C7 foramenal narrowing. Following this then hemostasis was obtained using thrombin-soaked Gelfoam and micro-pledgettes. When complete hemostasis was obtained using thrombin soaked gel foam and flow seal.  All gel foam was removed and a nerve hook could be easily passed out the neuroforamen without the lateral aspect of the C7 pedicle demonstrate the C7 neuroforamen completely decompressed. Irrigation was carried out no active bleeding was present. The incision was closed by approximating the ligamentum nuchae with 0 Vicryl sutures. The subcutaneous layers approximated with interrupted 0 Vicryl suture more superficial layers with interrupted 2-0 Vicryl sutures and the skin closed with interrupted 4-0 Vicryl sutures.  Dermabond was applied then a long MedPlex bandage  bridging both incision sites. Soft cervical collar placed.  All instrument and sponge counts were correct. Patient was then returned to supine position on her stretcher. In and out foley catheter was place and removed. The patient then reactivated and Returned to recovery room in satisfactory condition.  Physician assistant's responsibilities: Phillips Hay PA-C perform the duties of assistant physician and surgeon during this case present from the beginning of the case to the end of the case. She assisted with careful retraction of neural structures suctioning about her elements including cervical cord and C7 nerve roots. Performed closure of the incision on the ligamentum nuchae to the skin and application of dressing. She assisted in positioning the patient had removal the patient from the OR table to her stretcher.     [name]  12/17/2013, 10:23 AM

## 2013-12-17 NOTE — OR Nursing (Signed)
LR infusing at 86ml/hr. Has received 500 ml from present bag.

## 2013-12-17 NOTE — Discharge Instructions (Signed)
No lifting greater than 10 lbs. Avoid bending, stooping and twisting. Walking in house for first week then may start to get out slowly increasing activity using arms. Keep incision dry for 3 days, may use tegaderm or similar water impervious dressing. Avoid overhead use of arms and overhead lifting. Wear collar for comfort. Use ice as needed for comfort.

## 2013-12-18 MED ORDER — KETOROLAC TROMETHAMINE 30 MG/ML IJ SOLN: 30.0000 mg | Freq: Four times a day (QID) | INTRAMUSCULAR | Status: DC | PRN

## 2013-12-18 MED ORDER — CLONAZEPAM 0.5 MG PO TABS: 0.5000 mg | ORAL_TABLET | Freq: Two times a day (BID) | ORAL | Status: DC | PRN

## 2013-12-18 MED ORDER — OXYCODONE HCL ER 40 MG PO T12A: 40.0000 mg | EXTENDED_RELEASE_TABLET | Freq: Two times a day (BID) | ORAL | Status: DC

## 2013-12-18 MED ORDER — OXYCODONE-ACETAMINOPHEN 5-325 MG PO TABS: 1.0000 | ORAL_TABLET | ORAL | Status: DC | PRN

## 2013-12-18 NOTE — Progress Notes (Signed)
Subjective: 1 Day Post-Op Procedure(s) (LRB): REMOVAL OF POSTERIOR CERVICAL FUSION LATERAL MASS SCREWS AND RODS C5-T1, EXPLORE FUSION, LEFT SIX-SEVEN FORAMINOTOMY (N/A) Patient reports pain as moderate.   Anxious to go home.  Wants to use Aspen collar at home. Complains mostly of low back pain.  Will further address at office as his studies do not indicate a surgical problem.  Objective: Vital signs in last 24 hours: Temp:  [95.6 F (35.3 C)-98.7 F (37.1 C)] 98.7 F (37.1 C) (05/20 0555) Pulse Rate:  [64-108] 101 (05/20 0555) Resp:  [10-20] 18 (05/20 0555) BP: (81-155)/(44-87) 115/66 mmHg (05/20 0555) SpO2:  [93 %-100 %] 97 % (05/20 0555) Weight:  [79.107 kg (174 lb 6.4 oz)] 79.107 kg (174 lb 6.4 oz) (05/19 2031)  Intake/Output from previous day: 05/19 0701 - 05/20 0700 In: 2753 [P.O.:400; I.V.:2053; IV Piggyback:300] Out: 2605 [Urine:2305; Blood:300] Intake/Output this shift:    No results found for this basename: HGB,  in the last 72 hours No results found for this basename: WBC, RBC, HCT, PLT,  in the last 72 hours No results found for this basename: NA, K, CL, CO2, BUN, CREATININE, GLUCOSE, CALCIUM,  in the last 72 hours No results found for this basename: LABPT, INR,  in the last 72 hours  Neurovascular intact Sensation intact distally Intact pulses distally Incision: no drainage  Assessment/Plan: 1 Day Post-Op Procedure(s) (LRB): REMOVAL OF POSTERIOR CERVICAL FUSION LATERAL MASS SCREWS AND RODS C5-T1, EXPLORE FUSION, LEFT SIX-SEVEN FORAMINOTOMY (N/A) Up with therapy Home today  Epimenio Foot 12/18/2013, 9:00 AM

## 2013-12-18 NOTE — Progress Notes (Signed)
Changed IV fluid to Normal saline at 50 ml/hr per Phone order with MD

## 2013-12-18 NOTE — Progress Notes (Signed)
Patient arrived around 2150 vitals within normal range, he was voiding and requesting pain medication for lower back and neck; medicated patient for pain. SCD's on IV fluid changed to normal saline at 50 ml/hr, patient complaining about SCD's and neck brace being uncomfortable. Bandage on back of neck clean, dry and intact. Will continue to monitor.

## 2013-12-18 NOTE — Progress Notes (Signed)
OT Cancellation Note  Patient Details Name: Bradley Rice MRN: [id number] DOB: January 27, 1961   Cancelled Treatment:    Reason Eval/Treat Not Completed: OT screened, no needs identified, will sign off. Pt visited for OT assessment of ADLs this date and found to be up and and dressed in room Independently. Pt reports no ADL concerns. OT reviewed cervical precautions and provided handout. Educated pt on energy conservation and adaptations for ADLs to maintain precautions. Acute OT to sign off and pt ready for d/c.   Juluis Rainier [phone] 12/18/2013, 11:31 AM

## 2013-12-18 NOTE — Progress Notes (Signed)
Discharge orders received, pt for discharge home today,  IV D/C with dressing CDI to posterior neck.  D/C instructions and Rx given with verbalized understanding.  Family at bedside to assist pt with discharge. Staff brought pt downstairs via wheelchair.

## 2013-12-18 NOTE — Progress Notes (Signed)
Patient examined and note reviewed. 

## 2013-12-20 ENCOUNTER — Encounter (HOSPITAL_COMMUNITY): Payer: Self-pay | Admitting: Specialist

## 2013-12-24 NOTE — Discharge Summary (Signed)
Physician Discharge Summary  Patient ID: Bradley Rice MRN: [id number] DOB/AGE: 1960-12-30 53 y.o.  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  Discharge Diagnoses:  Principal Problem:   Cervical spondylosis with radiculopathy Painful, retained hardware posterior C5-T1, Left C6-7 foramenal stenosis, solid fusion C5-T1.   Past Medical History  Diagnosis Date  . Back pain   . Spider bite 2012  . COPD (chronic obstructive pulmonary disease)   . Dysphagia     patient reports d/t surgery, has modified diet, has to tilt head to swallow   . History of blood transfusion 2014    post op bleed  . Depression   . Coronary artery disease     Surgeries: Procedure(s): REMOVAL OF POSTERIOR CERVICAL FUSION LATERAL MASS SCREWS AND RODS C5-T1, EXPLORE FUSION, LEFT SIX-SEVEN FORAMINOTOMY on 12/17/2013   Consultants (if any):  none  Discharged Condition: Improved  Hospital Course: KING PINZON is an 53 y.o. male who was admitted 12/17/2013 with a diagnosis of Cervical spondylosis with radiculopathy and went to the operating room on 12/17/2013 and underwent the above named procedures.    He was given perioperative antibiotics:      Anti-infectives   Start     Dose/Rate Route Frequency Ordered Stop   12/17/13 1530  ceFAZolin (ANCEF) IVPB 1 g/50 mL premix     1 g 100 mL/hr over 30 Minutes Intravenous Every 8 hours 12/17/13 1506 12/18/13 0034   12/17/13 0600  ceFAZolin (ANCEF) IVPB 2 g/50 mL premix     2 g 100 mL/hr over 30 Minutes Intravenous On call to O.R. 12/16/13 1416 12/17/13 0745    .  He was given sequential compression devices, early ambulation  for DVT prophylaxis.  He benefited maximally from the hospital stay and there were no complications.    Recent vital signs:  Filed Vitals:   12/18/13 0945  BP: 112/66  Pulse:  88  Temp: 98.6 F (37 C)  Resp: 20    Recent laboratory studies:  Lab Results  Component Value Date   HGB 14.5 12/11/2013   HGB 14.4 05/11/2013   HGB 14.7 04/12/2013   Lab Results  Component Value Date   WBC 10.6* 12/11/2013   PLT 310 12/11/2013   No results found for this basename: INR   Lab Results  Component Value Date   NA 141 12/11/2013   K 4.9 12/11/2013   CL 103 12/11/2013   CO2 27 12/11/2013   BUN 10 12/11/2013   CREATININE 0.91 12/11/2013   GLUCOSE 113* 12/11/2013    Discharge Medications:     Medication List         clonazePAM 0.5 MG tablet  Commonly known as:  KLONOPIN  Take 1 tablet (0.5 mg total) by mouth 2 (two) times daily as needed (spasm).     clonazePAM 0.5 MG tablet  Commonly known as:  KLONOPIN  Take 0.5 mg by mouth 2 (two) times daily as needed for anxiety.     methocarbamol 500 MG tablet  Commonly known as:  ROBAXIN  Take 500 mg by mouth every 6 (six) hours as needed for muscle spasms.     morphine 15 MG tablet  Commonly known as:  MSIR  Take 15 mg by mouth every 6 (six) hours as needed for moderate pain or severe pain.     naproxen 500 MG tablet  Commonly known as:  NAPROSYN  Take 1 tablet (500 mg total) by mouth 2 (two) times daily.     nortriptyline 10 MG capsule  Commonly known as:  PAMELOR  Take 10 mg by mouth 2 (two) times daily.     OxyCODONE 20 mg T12a 12 hr tablet  Commonly known as:  OXYCONTIN  Take 1 tablet (20 mg total) by mouth every 12 (twelve) hours.     OxyCODONE 40 mg T12a 12 hr tablet  Commonly known as:  OXYCONTIN  Take 1 tablet (40 mg total) by mouth every 12 (twelve) hours.     oxyCODONE-acetaminophen 7.5-325 MG per tablet  Commonly known as:  PERCOCET  Take 1 tablet by mouth every 6 (six) hours as needed for pain.     oxyCODONE-acetaminophen 5-325 MG per tablet  Commonly known as:  PERCOCET/ROXICET  Take 1-2 tablets by mouth every 4 (four) hours as needed for moderate pain.     tadalafil 5 MG tablet   Commonly known as:  CIALIS  Take 1 tablet (5 mg total) by mouth daily.     zolpidem 10 MG tablet  Commonly known as:  AMBIEN  Take 10 mg by mouth at bedtime as needed for sleep.        Diagnostic Studies: Dg Chest 2 View  12/11/2013   CLINICAL DATA:  Coronary artery disease; preoperative evaluation  EXAM: CHEST  2 VIEW  COMPARISON:  May 11, 2013 chest radiograph and chest CT  FINDINGS: There is underlying emphysematous change with lower lobe bronchiectasis. There is mild scarring in both mid and lower lung zones. There is no frank edema or consolidation. The heart size is normal. The pulmonary vascularity reflects underlying emphysema. No adenopathy. There is postoperative change in the lower cervical spine.  IMPRESSION: Underlying emphysematous change with bilateral lower lobe bronchiectatic change and scattered areas of scarring. No edema or consolidation.   Electronically Signed   By: Lowella Grip M.D.   On: 12/11/2013 11:29   Ct Cervical Spine W Contrast  12/12/2013   CLINICAL DATA:  Recurrent left neck pain that burning sensation is back and numbness in the left upper extremity extending of the hand. Headaches.  FLUOROSCOPY TIME:  1 min 2 seconds  PROCEDURE: LUMBAR PUNCTURE FOR CERVICAL MYELOGRAM  After thorough discussion of risks and benefits of the procedure including bleeding, infection, injury to nerves, blood vessels, adjacent structures as well as headache and CSF leak, written and oral informed consent was obtained. Consent was obtained by Dr. Lawrence Santiago. We discussed the high likelihood of obtaining a diagnostic study.  Patient was positioned prone on the fluoroscopy table. Local anesthesia was provided with 1% lidocaine without epinephrine after prepped and draped in the usual sterile fashion. Puncture was performed at L2-3 using a 3 1/2 inch 22-gauge spinal needle via right paramedian approach. Using a single pass through the dura, the needle was placed within the thecal  sac, with return of clear CSF. 10 mL of Omnipaque-300 was injected into the thecal sac, with normal opacification of the nerve roots and cauda equina consistent with free flow within the subarachnoid space. The patient was then moved to the trendelenburg position and contrast flowed into the Cervical spine region.  I personally performed the lumbar puncture and administered the intrathecal contrast. I also personally supervised acquisition of the myelogram images.  TECHNIQUE: Contiguous axial images were obtained through the Cervical spine after the intrathecal infusion of infusion. Coronal and sagittal reconstructions were obtained of the axial image sets.  FINDINGS:  CERVICAL MYELOGRAM FINDINGS:  The patient is fused at C5-6 and C6-7 with anterior disc spacers. Lateral mass screw and rod fixation is present from C5-6 through C7-T1. There is no C6 screw on the left. The hardware is intact.  There is early truncation of the left C5 nerve root, better visualize than on the prior exam. The nerve roots fill normally through the fused segments.  CT CERVICAL MYELOGRAM FINDINGS:  And the cervical spine is imaged from the skullbase through T3. The patient is fused at C5-6, C6-7, and C7-T1. Solid bridging bone is evident across the disc space at these levels. Lateral mass and screw fixation is present at C5-6, C6-7, and C7-T1.  C2-3: Mild leftward disc protrusion and uncovertebral spurring is stable without significant stenosis.  C3-4: A broad-based disc protrusion partially effaces the ventral CSF. Mild right foraminal narrowing is stable due to uncovertebral disease.  C4-5: A broad-based disc osteophyte complex is again seen. Mild left foraminal narrowing is due to uncovertebral and facet hypertrophy.  C5-6: The patient is fused anteriorly and posteriorly. There is no significant stenosis. Previously seen lucency about the right L5 screw has resolved.  C6-7: Right pedicle screw extends into the facet joint without nerve  root compromise. The patient is fused anteriorly. Mild left foraminal narrowing is due to residual uncovertebral spurring. The patient is fused anteriorly. Solid bridging bone is evident.  C7-T1: Patient is fused anteriorly. Solid bridging bone is present across the disc space. Right-sided uncovertebral spurring leads to stable mild right foraminal narrowing. The central canal and left foramen are patent.  T1-2:  Negative.  Paraseptal emphysematous changes are evident.  IMPRESSION: 1. Fusion at C5-6, C6-7 and C7-T1. 2. Lateral mass screws at the same levels. The right C6 screw enters into the right C6-7 facet joint without nerve root compromise. 3. The lateral mass screws are otherwise contained within bone. 4. Mild central and right foraminal narrowing at C3-4 is stable. 5. Mild central and left foraminal stenosis at C4-5 is stable. 6. Mild right foraminal narrowing at C7-T1 due to right-sided uncovertebral spurring.   Electronically Signed   By: Lawrence Santiago M.D.   On: 12/12/2013 14:01   Dg Myelography Lumbar Inj Cervical  12/12/2013   CLINICAL DATA:  Recurrent left neck pain that burning sensation is back and numbness in the left upper extremity extending of the hand. Headaches.  FLUOROSCOPY TIME:  1 min 2 seconds  PROCEDURE: LUMBAR PUNCTURE FOR CERVICAL MYELOGRAM  After thorough discussion of risks and benefits of the procedure including bleeding, infection, injury to nerves, blood vessels, adjacent structures as well as headache and CSF leak, written and oral informed consent was obtained. Consent was obtained by Dr. Lawrence Santiago. We discussed the high likelihood of obtaining a diagnostic study.  Patient was positioned prone on the fluoroscopy table. Local anesthesia was provided with 1% lidocaine without epinephrine after prepped and draped in the usual sterile fashion. Puncture was performed at L2-3 using a 3 1/2 inch 22-gauge spinal needle via right paramedian approach. Using a single pass through the  dura, the needle was placed within the thecal sac, with return of clear CSF. 10 mL of Omnipaque-300 was injected into the thecal sac, with normal opacification of the nerve roots and cauda equina consistent with free flow within the subarachnoid space. The patient was then moved to the trendelenburg position and contrast flowed into the Cervical spine region.  I personally performed the lumbar puncture and administered the intrathecal contrast. I also personally supervised  acquisition of the myelogram images.  TECHNIQUE: Contiguous axial images were obtained through the Cervical spine after the intrathecal infusion of infusion. Coronal and sagittal reconstructions were obtained of the axial image sets.  FINDINGS: CERVICAL MYELOGRAM FINDINGS:  The patient is fused at C5-6 and C6-7 with anterior disc spacers. Lateral mass screw and rod fixation is present from C5-6 through C7-T1. There is no C6 screw on the left. The hardware is intact.  There is early truncation of the left C5 nerve root, better visualize than on the prior exam. The nerve roots fill normally through the fused segments.  CT CERVICAL MYELOGRAM FINDINGS:  And the cervical spine is imaged from the skullbase through T3. The patient is fused at C5-6, C6-7, and C7-T1. Solid bridging bone is evident across the disc space at these levels. Lateral mass and screw fixation is present at C5-6, C6-7, and C7-T1.  C2-3: Mild leftward disc protrusion and uncovertebral spurring is stable without significant stenosis.  C3-4: A broad-based disc protrusion partially effaces the ventral CSF. Mild right foraminal narrowing is stable due to uncovertebral disease.  C4-5: A broad-based disc osteophyte complex is again seen. Mild left foraminal narrowing is due to uncovertebral and facet hypertrophy.  C5-6: The patient is fused anteriorly and posteriorly. There is no significant stenosis. Previously seen lucency about the right L5 screw has resolved.  C6-7: Right pedicle screw  extends into the facet joint without nerve root compromise. The patient is fused anteriorly. Mild left foraminal narrowing is due to residual uncovertebral spurring. The patient is fused anteriorly. Solid bridging bone is evident.  C7-T1: Patient is fused anteriorly. Solid bridging bone is present across the disc space. Right-sided uncovertebral spurring leads to stable mild right foraminal narrowing. The central canal and left foramen are patent.  T1-2:  Negative.  Paraseptal emphysematous changes are evident.  IMPRESSION: 1. Fusion at C5-6, C6-7 and C7-T1. 2. Lateral mass screws at the same levels. The right C6 screw enters into the right C6-7 facet joint without nerve root compromise. 3. The lateral mass screws are otherwise contained within bone. 4. Mild central and right foraminal narrowing at C3-4 is stable. 5. Mild central and left foraminal stenosis at C4-5 is stable. 6. Mild right foraminal narrowing at C7-T1 due to right-sided uncovertebral spurring.   Electronically Signed   By: Lawrence Santiago M.D.   On: 12/12/2013 14:01    Disposition: 01-Home or Self Care  Discharge Instructions   Call MD / Call 911    Complete by:  As directed   If you experience chest pain or shortness of breath, CALL 911 and be transported to the hospital emergency room.  If you develope a fever above 101 F, pus (white drainage) or increased drainage or redness at the wound, or calf pain, call your surgeon's office.     Constipation Prevention    Complete by:  As directed   Drink plenty of fluids.  Prune juice may be helpful.  You may use a stool softener, such as Colace (over the counter) 100 mg twice a day.  Use MiraLax (over the counter) for constipation as needed.     Diet - low sodium heart healthy    Complete by:  As directed      Driving restrictions    Complete by:  As directed   No driving     Increase activity slowly as tolerated    Complete by:  As directed      Lifting restrictions    Complete  by:  As  directed   No lifting           Follow-up Information   Follow up with NITKA,JAMES E, MD In 2 weeks.   Specialty:  Orthopedic Surgery   Contact information:   [address] [place] [address] [phone]        Signed: Epimenio Foot 12/24/2013, 3:36 PM

## 2013-12-27 NOTE — Discharge Summary (Signed)
Patient d/c summary note and lab reviewed.  

## 2013-12-28 ENCOUNTER — Emergency Department (HOSPITAL_COMMUNITY): Payer: 59

## 2013-12-28 ENCOUNTER — Observation Stay (HOSPITAL_COMMUNITY)
Admission: EM | Admit: 2013-12-28 | Discharge: 2013-12-30 | DRG: 948 | Discharge status: Against Medical Advice | Payer: 59 | Attending: Internal Medicine | Admitting: Internal Medicine

## 2013-12-28 ENCOUNTER — Encounter (HOSPITAL_COMMUNITY): Payer: Self-pay | Admitting: Emergency Medicine

## 2013-12-28 DIAGNOSIS — I2510 Atherosclerotic heart disease of native coronary artery without angina pectoris: Secondary | ICD-10-CM | POA: Diagnosis present

## 2013-12-28 DIAGNOSIS — R4182 Altered mental status, unspecified: Principal | ICD-10-CM | POA: Diagnosis present

## 2013-12-28 DIAGNOSIS — Z981 Arthrodesis status: Secondary | ICD-10-CM

## 2013-12-28 DIAGNOSIS — G459 Transient cerebral ischemic attack, unspecified: Secondary | ICD-10-CM | POA: Diagnosis present

## 2013-12-28 DIAGNOSIS — G5602 Carpal tunnel syndrome, left upper limb: Secondary | ICD-10-CM

## 2013-12-28 DIAGNOSIS — M4712 Other spondylosis with myelopathy, cervical region: Secondary | ICD-10-CM

## 2013-12-28 DIAGNOSIS — M47819 Spondylosis without myelopathy or radiculopathy, site unspecified: Secondary | ICD-10-CM | POA: Diagnosis present

## 2013-12-28 DIAGNOSIS — N529 Male erectile dysfunction, unspecified: Secondary | ICD-10-CM | POA: Diagnosis present

## 2013-12-28 DIAGNOSIS — M4722 Other spondylosis with radiculopathy, cervical region: Secondary | ICD-10-CM | POA: Diagnosis present

## 2013-12-28 DIAGNOSIS — M960 Pseudarthrosis after fusion or arthrodesis: Secondary | ICD-10-CM

## 2013-12-28 DIAGNOSIS — Z87891 Personal history of nicotine dependence: Secondary | ICD-10-CM

## 2013-12-28 DIAGNOSIS — R930 Abnormal findings on diagnostic imaging of skull and head, not elsewhere classified: Secondary | ICD-10-CM

## 2013-12-28 DIAGNOSIS — M47812 Spondylosis without myelopathy or radiculopathy, cervical region: Secondary | ICD-10-CM | POA: Diagnosis present

## 2013-12-28 DIAGNOSIS — I959 Hypotension, unspecified: Secondary | ICD-10-CM

## 2013-12-28 LAB — URINALYSIS, ROUTINE W REFLEX MICROSCOPIC
Bilirubin Urine: NEGATIVE
GLUCOSE, UA: NEGATIVE mg/dL
HGB URINE DIPSTICK: NEGATIVE
Ketones, ur: NEGATIVE mg/dL
LEUKOCYTES UA: NEGATIVE
Nitrite: NEGATIVE
PH: 6 (ref 5.0–8.0)
Protein, ur: NEGATIVE mg/dL
SPECIFIC GRAVITY, URINE: 1.01 (ref 1.005–1.030)
Urobilinogen, UA: 1 mg/dL (ref 0.0–1.0)

## 2013-12-28 LAB — CBC
HEMATOCRIT: 39.4 % (ref 39.0–52.0)
Hemoglobin: 13.5 g/dL (ref 13.0–17.0)
MCH: 32.1 pg (ref 26.0–34.0)
MCHC: 34.3 g/dL (ref 30.0–36.0)
MCV: 93.8 fL (ref 78.0–100.0)
PLATELETS: 380 10*3/uL (ref 150–400)
RBC: 4.2 MIL/uL — ABNORMAL LOW (ref 4.22–5.81)
RDW: 13.1 % (ref 11.5–15.5)
WBC: 13.3 10*3/uL — ABNORMAL HIGH (ref 4.0–10.5)

## 2013-12-28 LAB — COMPREHENSIVE METABOLIC PANEL
ALK PHOS: 88 U/L (ref 39–117)
ALT: 11 U/L (ref 0–53)
AST: 12 U/L (ref 0–37)
Albumin: 3.9 g/dL (ref 3.5–5.2)
BILIRUBIN TOTAL: 0.5 mg/dL (ref 0.3–1.2)
BUN: 5 mg/dL — ABNORMAL LOW (ref 6–23)
CO2: 28 meq/L (ref 19–32)
Calcium: 9.4 mg/dL (ref 8.4–10.5)
Chloride: 97 mEq/L (ref 96–112)
Creatinine, Ser: 0.73 mg/dL (ref 0.50–1.35)
GLUCOSE: 127 mg/dL — AB (ref 70–99)
POTASSIUM: 3.7 meq/L (ref 3.7–5.3)
SODIUM: 138 meq/L (ref 137–147)
Total Protein: 8.2 g/dL (ref 6.0–8.3)

## 2013-12-28 LAB — PROTIME-INR
INR: 1.07 (ref 0.00–1.49)
PROTHROMBIN TIME: 13.7 s (ref 11.6–15.2)

## 2013-12-28 LAB — RAPID URINE DRUG SCREEN, HOSP PERFORMED
Amphetamines: NOT DETECTED
Barbiturates: NOT DETECTED
Benzodiazepines: NOT DETECTED
Cocaine: NOT DETECTED
OPIATES: NOT DETECTED
Tetrahydrocannabinol: NOT DETECTED

## 2013-12-28 LAB — DIFFERENTIAL
BASOS ABS: 0.1 10*3/uL (ref 0.0–0.1)
Basophils Relative: 1 % (ref 0–1)
EOS ABS: 0.1 10*3/uL (ref 0.0–0.7)
EOS PCT: 1 % (ref 0–5)
LYMPHS PCT: 10 % — AB (ref 12–46)
Lymphs Abs: 1.3 10*3/uL (ref 0.7–4.0)
MONO ABS: 0.7 10*3/uL (ref 0.1–1.0)
Monocytes Relative: 5 % (ref 3–12)
Neutro Abs: 11.2 10*3/uL — ABNORMAL HIGH (ref 1.7–7.7)
Neutrophils Relative %: 83 % — ABNORMAL HIGH (ref 43–77)

## 2013-12-28 LAB — I-STAT TROPONIN, ED: Troponin i, poc: 0.01 ng/mL (ref 0.00–0.08)

## 2013-12-28 LAB — ETHANOL

## 2013-12-28 LAB — APTT: aPTT: 34 seconds (ref 24–37)

## 2013-12-28 MED ORDER — TADALAFIL 5 MG PO TABS: 5.0000 mg | ORAL_TABLET | Freq: Every day | ORAL | Status: DC

## 2013-12-28 MED ORDER — OXYCODONE-ACETAMINOPHEN 5-325 MG PO TABS
1.0000 | ORAL_TABLET | ORAL | Status: DC | PRN
  Administered 2013-12-29 – 2013-12-30 (×2): 2 via ORAL
  Filled 2013-12-28 (×2): qty 2

## 2013-12-28 MED ORDER — NORTRIPTYLINE HCL 10 MG PO CAPS
10.0000 mg | ORAL_CAPSULE | Freq: Two times a day (BID) | ORAL | Status: DC
  Administered 2013-12-28 – 2013-12-30 (×4): 10 mg via ORAL
  Filled 2013-12-28 (×8): qty 1

## 2013-12-28 MED ORDER — MORPHINE SULFATE 15 MG PO TABS
15.0000 mg | ORAL_TABLET | Freq: Four times a day (QID) | ORAL | Status: DC | PRN
  Administered 2013-12-28 – 2013-12-29 (×2): 15 mg via ORAL
  Filled 2013-12-28 (×2): qty 1

## 2013-12-28 MED ORDER — ONDANSETRON HCL 4 MG/2ML IJ SOLN: 4.0000 mg | Freq: Four times a day (QID) | INTRAMUSCULAR | Status: DC | PRN

## 2013-12-28 MED ORDER — MORPHINE SULFATE 4 MG/ML IJ SOLN
4.0000 mg | Freq: Once | INTRAMUSCULAR | Status: AC
  Administered 2013-12-28: 4 mg via INTRAVENOUS
  Filled 2013-12-28: qty 1

## 2013-12-28 MED ORDER — OXYCODONE HCL ER 20 MG PO T12A
40.0000 mg | EXTENDED_RELEASE_TABLET | Freq: Two times a day (BID) | ORAL | Status: DC
  Administered 2013-12-28 – 2013-12-30 (×4): 40 mg via ORAL
  Filled 2013-12-28 (×4): qty 2

## 2013-12-28 MED ORDER — IOHEXOL 350 MG/ML SOLN
80.0000 mL | Freq: Once | INTRAVENOUS | Status: AC | PRN
  Administered 2013-12-28: 80 mL via INTRAVENOUS

## 2013-12-28 MED ORDER — CLONAZEPAM 0.5 MG PO TABS
0.5000 mg | ORAL_TABLET | Freq: Two times a day (BID) | ORAL | Status: DC | PRN
  Administered 2013-12-28 – 2013-12-30 (×3): 0.5 mg via ORAL
  Filled 2013-12-28 (×3): qty 1

## 2013-12-28 MED ORDER — METHOCARBAMOL 500 MG PO TABS
500.0000 mg | ORAL_TABLET | Freq: Four times a day (QID) | ORAL | Status: DC | PRN
  Administered 2013-12-29 – 2013-12-30 (×2): 500 mg via ORAL
  Filled 2013-12-28 (×2): qty 1

## 2013-12-28 MED ORDER — ONDANSETRON HCL 4 MG PO TABS: 4.0000 mg | ORAL_TABLET | Freq: Four times a day (QID) | ORAL | Status: DC | PRN

## 2013-12-28 MED ORDER — NORTRIPTYLINE HCL 10 MG PO CAPS
ORAL_CAPSULE | ORAL | Status: AC
  Filled 2013-12-28: qty 1

## 2013-12-28 MED ORDER — SODIUM CHLORIDE 0.9 % IJ SOLN
3.0000 mL | Freq: Two times a day (BID) | INTRAMUSCULAR | Status: DC
  Administered 2013-12-28 – 2013-12-30 (×2): 3 mL via INTRAVENOUS

## 2013-12-28 NOTE — ED Notes (Signed)
MD at bedside. 

## 2013-12-28 NOTE — H&P (Signed)
[facility] History and Physical  [name] VZC:[id number] DOB: Nov 19, 1960 DOA: 12/28/2013  Referring physician: ER. PCP: [name], MD   Chief Complaint: Altered mental status.  HPI: Bradley Rice is a 53 y.o. male  This is a 53 year old man who recently had neck surgery on 12/17/2013. This involved the following procedure:REMOVAL OF POSTERIOR CERVICAL FUSION LATERAL MASS SCREWS AND RODS C5-T1, EXPLORE FUSION, LEFT SIX-SEVEN FORAMINOTOMY . He was discharged home soon and seemed to do well. In the last 24 hours he has had bleeding from the incision site but this is resolved at the present time. However approximately 3 days ago he had an episode where he suddenly lost his memory and did not know where he was. After the episode he felt very drained and tired. He had another episode today. Again he felt drained and tired. He also tells me that he has noticed weakness in the left arm and left leg today. He tells me also that his speech has been altered-he is unable to say the words he wants and apparently it is slurred. He also tells me that people have said to him that they cannot understand what he is saying.  He is on multiple opioids and also benzodiazepines. He tells me that there has been absolutely no change in these doses.    Review of Systems:  Constitutional:  No weight loss, night sweats, Fevers, chills, fatigue.  HEENT:  No headaches, Difficulty swallowing,Tooth/dental problems,Sore throat,  No sneezing, itching, ear ache, nasal congestion, post nasal drip,  Cardio-vascular:  No chest pain, Orthopnea, PND, swelling in lower extremities, anasarca, dizziness, palpitations  GI:  No heartburn, indigestion, abdominal pain, nausea, vomiting, diarrhea, change in bowel habits, loss of appetite  Resp:  No shortness of breath with exertion or at rest. No excess mucus, no productive cough, No non-productive cough, No coughing up of blood.No change in color of mucus.No  wheezing.No chest wall deformity  Skin:  no rash or lesions.  GU:  no dysuria, change in color of urine, no urgency or frequency. No flank pain.  Musculoskeletal:  No joint pain or swelling. No decreased range of motion. No back pain.  Psych:  No change in mood or affect. No depression or anxiety. No memory loss.   Past Medical History  Diagnosis Date  . Back pain   . Spider bite 2012  . COPD (chronic obstructive pulmonary disease)   . Dysphagia     patient reports d/t surgery, has modified diet, has to tilt head to swallow   . History of blood transfusion 2014    post op bleed  . Depression   . Coronary artery disease    Past Surgical History  Procedure Laterality Date  . Back surgery    . Lumbar fusion  2000  . Cholecystectomy  2009  . Anterior cervical decomp/discectomy fusion  12/22/2011    Procedure: ANTERIOR CERVICAL DECOMPRESSION/DISCECTOMY FUSION 3 LEVELS;  Surgeon: Melina Schools, MD;  Location: [place];  Service: Orthopedics;  Laterality: N/A;  ACDF C5-T1  . Anterior cervical decomp/discectomy fusion  06/25/2012    Procedure: ANTERIOR CERVICAL DECOMPRESSION/DISCECTOMY FUSION 1 LEVEL/HARDWARE REMOVAL;  Surgeon: [name], MD;  Location: [place];  Service: Orthopedics;  Laterality: N/A;  Removal anterior cervical plate C5-T1, Explore Fusion, Left C5-6, C6-7, C7-T1 Re-do Foraminotomy, Left open carpal tunnel release with release of ulnar nerve at [facility], Posterior Cervical Fusion with lateral mass screw  . Carpal tunnel release  06/25/2012    Procedure:  CARPAL TUNNEL RELEASE;  Surgeon: [name], MD;  Location: [place];  Service: Orthopedics;  Laterality: Left;  as above  . Posterior cervical fusion/foraminotomy  06/25/2012    Procedure: POSTERIOR CERVICAL FUSION/FORAMINOTOMY LEVEL 1;  Surgeon: [name], MD;  Location: [place];  Service: Orthopedics;  Laterality: N/A;  as above  . Cardiac catheterization    . Coronary angioplasty  2001    [facility]  . Posterior  cervical fusion/foraminotomy N/A 12/17/2013    Procedure: REMOVAL OF POSTERIOR CERVICAL FUSION LATERAL MASS SCREWS AND RODS C5-T1, EXPLORE FUSION, LEFT SIX-SEVEN FORAMINOTOMY;  Surgeon: [name], MD;  Location: [place];  Service: Orthopedics;  Laterality: N/A;   Social History:  reports that he quit smoking about 12 months ago. His smoking use included Cigarettes. He started smoking about 13 months ago. He has a 30 pack-year smoking history. He has never used smokeless tobacco. He reports that he does not drink alcohol or use illicit drugs.  No Known Allergies  Family History  Problem Relation Age of Onset  . Anesthesia problems Neg Hx   . ALS Mother      Prior to Admission medications   Medication Sig Start Date End Date Taking? Authorizing Provider  clonazePAM (KLONOPIN) 0.5 MG tablet Take 0.5 mg by mouth 2 (two) times daily as needed for anxiety.   Yes Historical Provider, MD  methocarbamol (ROBAXIN) 500 MG tablet Take 500 mg by mouth every 6 (six) hours as needed for muscle spasms. 06/26/12  Yes Epimenio Foot, PA-C  morphine (MSIR) 15 MG tablet Take 15 mg by mouth every 6 (six) hours as needed for moderate pain or severe pain.   Yes Historical Provider, MD  nortriptyline (PAMELOR) 10 MG capsule Take 10 mg by mouth 2 (two) times daily.    Yes Historical Provider, MD  OxyCODONE (OXYCONTIN) 40 mg T12A 12 hr tablet Take 1 tablet (40 mg total) by mouth every 12 (twelve) hours. 12/18/13  Yes Epimenio Foot, PA-C  oxyCODONE-acetaminophen (PERCOCET) 7.5-325 MG per tablet Take 1 tablet by mouth every 6 (six) hours as needed for pain. 05/11/13  Yes Nathan R. Pickering, MD  tadalafil (CIALIS) 5 MG tablet Take 1 tablet (5 mg total) by mouth daily. 12/09/13  Yes Sharion Balloon, FNP  zolpidem (AMBIEN) 10 MG tablet Take 10 mg by mouth at bedtime as needed for sleep.   Yes Historical Provider, MD  oxyCODONE-acetaminophen (PERCOCET/ROXICET) 5-325 MG per tablet Take 1-2 tablets by mouth every 4 (four)  hours as needed for moderate pain. 12/18/13   Epimenio Foot, PA-C   Physical Exam: Filed Vitals:   12/28/13 1834  BP: 106/74  Pulse: 76  Temp:   Resp: 18    BP 106/74  Pulse 76  Temp(Src) 98.1 F (36.7 C) (Oral)  Resp 18  SpO2 100%  General:  Appears calm and comfortable Eyes: PERRL, normal lids, irises & conjunctiva ENT: grossly normal hearing, lips & tongue Neck: no LAD, masses or thyromegaly Cardiovascular: RRR, no m/r/g. No LE edema. Telemetry: SR, no arrhythmias  Respiratory: CTA bilaterally, no w/r/r. Normal respiratory effort. Abdomen: soft, ntnd Skin: no rash or induration seen on limited exam Musculoskeletal: grossly normal tone BUE/BLE Psychiatric: grossly normal mood and affect, speech fluent and appropriate Neurologic: Possibly he is slightly weaker on the left arm. His legs bilaterally have normal strength and equal. There is no facial asymmetry. His speech is entirely normal. There is no dysarthria nor is there any dysphaia.  Labs on Admission:  Basic Metabolic Panel:  Recent Labs Lab 12/28/13 1534  NA 138  K 3.7  CL 97  CO2 28  GLUCOSE 127*  BUN 5*  CREATININE 0.73  CALCIUM 9.4   Liver Function Tests:  Recent Labs Lab 12/28/13 1534  AST 12  ALT 11  ALKPHOS 88  BILITOT 0.5  PROT 8.2  ALBUMIN 3.9   No results found for this basename: LIPASE, AMYLASE,  in the last 168 hours No results found for this basename: AMMONIA,  in the last 168 hours CBC:  Recent Labs Lab 12/28/13 1534  WBC 13.3*  NEUTROABS 11.2*  HGB 13.5  HCT 39.4  MCV 93.8  PLT 380   Cardiac Enzymes: No results found for this basename: CKTOTAL, CKMB, CKMBINDEX, TROPONINI,  in the last 168 hours  BNP (last 3 results) No results found for this basename: PROBNP,  in the last 8760 hours CBG: No results found for this basename: GLUCAP,  in the last 168 hours  Radiological Exams on Admission: Ct Head Wo Contrast  12/28/2013   CLINICAL DATA:  LOSS OF  CONSCIOUSNESS LOSS OF CONSCIOUSNESS  EXAM: CT HEAD WITHOUT CONTRAST  TECHNIQUE: Contiguous axial images were obtained from the base of the skull through the vertex without intravenous contrast.  COMPARISON:  None.  FINDINGS: No acute intracranial hemorrhage. No focal mass lesion. No CT evidence of acute infarction. No midline shift or mass effect. No hydrocephalus. Basilar cisterns are patent. Paranasal sinuses and mastoid air cells are clear. Prior sinus surgery. Mild mucosal inflammation the sphenoid sinus  IMPRESSION: No acute intracranial findings.   Electronically Signed   By: Suzy Bouchard M.D.   On: 12/28/2013 16:58   Ct Angio Neck W/cm &/or Wo/cm  12/28/2013   CLINICAL DATA:  Passed out.  Recent neck surgery.  EXAM: CT ANGIOGRAPHY NECK  TECHNIQUE: Multidetector CT imaging of the neck was performed using the standard protocol during bolus administration of intravenous contrast. Multiplanar CT image reconstructions and MIPs were obtained to evaluate the vascular anatomy. Carotid stenosis measurements (when applicable) are obtained utilizing NASCET criteria, using the distal internal carotid diameter as the denominator.  CONTRAST:  75mL OMNIPAQUE IOHEXOL 350 MG/ML SOLN  COMPARISON:  12/28/2013 head CT. 12/12/2013 neck CT. 05/11/2013 chest CT.  FINDINGS: Left vertebral artery is dominant arising directly from the aortic arch. Mild narrowing proximal left vertebral artery. Otherwise left vertebral artery is unremarkable.  Right vertebral artery is patent. At the C1-2 level there are veins associated with the right vertebral artery (asymmetric compared to left) probably related to asymmetric venous drainage rather than a fistula.  Carotid arteries without significant narrowing or irregularity.  Interbody fusion C5-6 through C7-T1. Posterior hardware has been removed when compared to 12/12/2013 CT. Partial left laminectomy and facetectomy C6-7 level. Small amount of fluid surrounds the operative site most  notable spinous process region. This may represent expected findings. Infection would be difficult to exclude if of high clinical concern.  Cervical spondylotic changes without significant spinal stenosis/cord compression. If postoperative complication is of concern, MR of the cervical spine may be considered.  No prevertebral abnormality. No neck abscess or fluid collection. Scattered normal/top-normal size lymph nodes.  Bullae lung apices. Septal thickening/ scarring appears more prominent than on the 05/11/2013 exam (see series 5, image 41). This may be related to the dependent edema. Follow-up chest CTA in 3-6 months recommended.  Review of the MIP images confirms the above findings.  IMPRESSION: Left vertebral artery is dominant arising  directly from the aortic arch. Mild narrowing proximal left vertebral artery. Otherwise left vertebral artery is unremarkable.  Right vertebral artery is patent. At the C1-2 level there are veins associated with the right vertebral artery (asymmetric compared to left) probably related to asymmetric venous drainage rather than a fistula.  Carotid arteries without significant narrowing or irregularity.  Interbody fusion C5-6 through C7-T1. Posterior hardware has been removed when compared to 12/12/2013 CT. Partial left laminectomy and facetectomy C6-7 level. Small amount of fluid surrounds the operative site most notable spinous process region. This may represent expected findings. Infection would be difficult to exclude if of high clinical concern.  Cervical spondylotic changes without significant spinal stenosis/cord compression. If postoperative complication is of concern, MR of the cervical spine may be considered.  Bullae lung apices. Septal thickening/ scarring appears more prominent than on the 05/11/2013 exam (see series 5, image 41). This may be related to the dependent edema. Follow-up chest CTA in 3-6 months recommended.   Electronically Signed   By: Chauncey Cruel M.D.    On: 12/28/2013 17:33      Assessment/Plan   1. Possible TIA/CVA. I'm not entirely convinced of this clinically. However his history may point towards this. 2. Cervical spondylosis with radiculopathy, recent surgery as indicated above.  Plan: 1. Admit to telemetry floor. 2. MRI brain scan. 3. Carotid Dopplers. 4. Neurology consultation.  Other recommendations will depend on patient's hospital progress.   Code Status: Full code.   Family Communication: I discussed the plan with the patient at the bedside.   Disposition Plan: Home when medically stable.  Time spent: 60 minutes.  [facility] Pager [phone].  **Disclaimer: This note may have been dictated with voice recognition software. Similar sounding words can inadvertently be transcribed and this note may contain transcription errors which may not have been corrected upon publication of note.**
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
## 2013-12-28 NOTE — ED Notes (Signed)
Patient reports  syncopal episode today. Per patient "blackouts started yesterday." Per patient multiple episodes starting yesterday, in which he will get diaphoretic and forget what he is doing or where he is. Patient also reports increase in heart rate. Patient had neck surgery and it started bleeding at incision site yesterday in which patient placed steri stripes.

## 2013-12-29 ENCOUNTER — Inpatient Hospital Stay (HOSPITAL_COMMUNITY): Payer: 59

## 2013-12-29 DIAGNOSIS — I959 Hypotension, unspecified: Secondary | ICD-10-CM

## 2013-12-29 LAB — COMPREHENSIVE METABOLIC PANEL
ALT: 10 U/L (ref 0–53)
AST: 11 U/L (ref 0–37)
Albumin: 3.3 g/dL — ABNORMAL LOW (ref 3.5–5.2)
Alkaline Phosphatase: 74 U/L (ref 39–117)
BUN: 6 mg/dL (ref 6–23)
CO2: 27 mEq/L (ref 19–32)
Calcium: 9.3 mg/dL (ref 8.4–10.5)
Chloride: 101 mEq/L (ref 96–112)
Creatinine, Ser: 0.77 mg/dL (ref 0.50–1.35)
GFR calc Af Amer: >90 mL/min (ref >90)
GFR calc non Af Amer: >90 mL/min (ref >90)
Glucose, Bld: 98 mg/dL (ref 70–99)
Potassium: 4 mEq/L (ref 3.7–5.3)
Sodium: 142 mEq/L (ref 137–147)
Total Bilirubin: 0.3 mg/dL (ref 0.3–1.2)
Total Protein: 7.1 g/dL (ref 6.0–8.3)

## 2013-12-29 LAB — CBC
HCT: 37.8 % — ABNORMAL LOW (ref 39.0–52.0)
Hemoglobin: 12.6 g/dL — ABNORMAL LOW (ref 13.0–17.0)
MCH: 31.6 pg (ref 26.0–34.0)
MCHC: 33.3 g/dL (ref 30.0–36.0)
MCV: 94.7 fL (ref 78.0–100.0)
Platelets: 375 10*3/uL (ref 150–400)
RBC: 3.99 MIL/uL — ABNORMAL LOW (ref 4.22–5.81)
RDW: 13.1 % (ref 11.5–15.5)
WBC: 7.9 10*3/uL (ref 4.0–10.5)

## 2013-12-29 MED ORDER — DOCUSATE SODIUM 100 MG PO CAPS
200.0000 mg | ORAL_CAPSULE | Freq: Two times a day (BID) | ORAL | Status: DC
  Administered 2013-12-29 – 2013-12-30 (×2): 200 mg via ORAL
  Filled 2013-12-29 (×2): qty 2

## 2013-12-29 MED ORDER — NICOTINE 14 MG/24HR TD PT24
14.0000 mg | MEDICATED_PATCH | Freq: Every day | TRANSDERMAL | Status: DC
  Administered 2013-12-29 – 2013-12-30 (×2): 14 mg via TRANSDERMAL
  Filled 2013-12-29 (×2): qty 1

## 2013-12-29 MED ORDER — SODIUM CHLORIDE 0.9 % IV BOLUS (SEPSIS): 500.0000 mL | Freq: Once | INTRAVENOUS | Status: AC

## 2013-12-29 MED ORDER — SODIUM CHLORIDE 0.9 % IV SOLN
INTRAVENOUS | Status: DC
  Administered 2013-12-29: 21:00:00 via INTRAVENOUS

## 2013-12-29 NOTE — Progress Notes (Signed)
[facility] PROGRESS NOTE  Bradley Rice HGD:[id number] DOB: Jan 06, 1961 DOA: 12/28/2013 PCP: [name], MD  Assessment/Plan:  1.AMS- ?TIA vs Meds.  -Await MRI and carotid dopplers -await Neuro input -consult PT/OT 2.Cervical spondylosis with radiculopathy, s/p recent surgery per Dr Louanne Skye -continue pain management 3.Hypotension -IVF, hgb stable -d/c cialis -follow and recheck  Code Status: Full Family Communication:none at bedside Disposition Plan: to home when medically ready   Consultants:  none  Procedures:  none  Antibiotics:  none  HPI/Subjective: Pt states he feels better, speech back to baseline. Denies focal weakness. Objective: Filed Vitals:   12/29/13 0630  BP: 88/59  Pulse: 80  Temp: 97.9 F (36.6 C)  Resp:     Intake/Output Summary (Last 24 hours) at 12/29/13 1011 Last data filed at 12/29/13 0930  Gross per 24 hour  Intake    240 ml  Output      0 ml  Net    240 ml   Filed Weights   12/28/13 2235 12/29/13 0630  Weight: 78.926 kg (174 lb) 73.528 kg (162 lb 1.6 oz)    Exam:  General: alert & oriented x 3 In NAD HEENT:posterior cervical incision clean and dry, healing well Cardiovascular: RRR, nl S1 s2 Respiratory: CTAB Abdomen: soft +BS NT/ND, no masses palpable Extremities: No cyanosis and no edema Neuro: non focal    Data Reviewed: Basic Metabolic Panel:  Recent Labs Lab 12/28/13 1534 12/29/13 0603  NA 138 142  K 3.7 4.0  CL 97 101  CO2 28 27  GLUCOSE 127* 98  BUN 5* 6  CREATININE 0.73 0.77  CALCIUM 9.4 9.3   Liver Function Tests:  Recent Labs Lab 12/28/13 1534 12/29/13 0603  AST 12 11  ALT 11 10  ALKPHOS 88 74  BILITOT 0.5 0.3  PROT 8.2 7.1  ALBUMIN 3.9 3.3*   No results found for this basename: LIPASE, AMYLASE,  in the last 168 hours No results found for this basename: AMMONIA,  in the last 168 hours CBC:  Recent Labs Lab 12/28/13 1534 12/29/13 0603  WBC 13.3* 7.9  NEUTROABS 11.2*   --   HGB 13.5 12.6*  HCT 39.4 37.8*  MCV 93.8 94.7  PLT 380 375   Cardiac Enzymes: No results found for this basename: CKTOTAL, CKMB, CKMBINDEX, TROPONINI,  in the last 168 hours BNP (last 3 results) No results found for this basename: PROBNP,  in the last 8760 hours CBG: No results found for this basename: GLUCAP,  in the last 168 hours  No results found for this or any previous visit (from the past 240 hour(s)).   Studies: Ct Head Wo Contrast  12/28/2013   CLINICAL DATA:  LOSS OF CONSCIOUSNESS LOSS OF CONSCIOUSNESS  EXAM: CT HEAD WITHOUT CONTRAST  TECHNIQUE: Contiguous axial images were obtained from the base of the skull through the vertex without intravenous contrast.  COMPARISON:  None.  FINDINGS: No acute intracranial hemorrhage. No focal mass lesion. No CT evidence of acute infarction. No midline shift or mass effect. No hydrocephalus. Basilar cisterns are patent. Paranasal sinuses and mastoid air cells are clear. Prior sinus surgery. Mild mucosal inflammation the sphenoid sinus  IMPRESSION: No acute intracranial findings.   Electronically Signed   By: Suzy Bouchard M.D.   On: 12/28/2013 16:58   Ct Angio Neck W/cm &/or Wo/cm  12/28/2013   CLINICAL DATA:  Passed out.  Recent neck surgery.  EXAM: CT ANGIOGRAPHY NECK  TECHNIQUE: Multidetector CT imaging of the neck was performed  using the standard protocol during bolus administration of intravenous contrast. Multiplanar CT image reconstructions and MIPs were obtained to evaluate the vascular anatomy. Carotid stenosis measurements (when applicable) are obtained utilizing NASCET criteria, using the distal internal carotid diameter as the denominator.  CONTRAST:  40mL OMNIPAQUE IOHEXOL 350 MG/ML SOLN  COMPARISON:  12/28/2013 head CT. 12/12/2013 neck CT. 05/11/2013 chest CT.  FINDINGS: Left vertebral artery is dominant arising directly from the aortic arch. Mild narrowing proximal left vertebral artery. Otherwise left vertebral artery is  unremarkable.  Right vertebral artery is patent. At the C1-2 level there are veins associated with the right vertebral artery (asymmetric compared to left) probably related to asymmetric venous drainage rather than a fistula.  Carotid arteries without significant narrowing or irregularity.  Interbody fusion C5-6 through C7-T1. Posterior hardware has been removed when compared to 12/12/2013 CT. Partial left laminectomy and facetectomy C6-7 level. Small amount of fluid surrounds the operative site most notable spinous process region. This may represent expected findings. Infection would be difficult to exclude if of high clinical concern.  Cervical spondylotic changes without significant spinal stenosis/cord compression. If postoperative complication is of concern, MR of the cervical spine may be considered.  No prevertebral abnormality. No neck abscess or fluid collection. Scattered normal/top-normal size lymph nodes.  Bullae lung apices. Septal thickening/ scarring appears more prominent than on the 05/11/2013 exam (see series 5, image 41). This may be related to the dependent edema. Follow-up chest CTA in 3-6 months recommended.  Review of the MIP images confirms the above findings.  IMPRESSION: Left vertebral artery is dominant arising directly from the aortic arch. Mild narrowing proximal left vertebral artery. Otherwise left vertebral artery is unremarkable.  Right vertebral artery is patent. At the C1-2 level there are veins associated with the right vertebral artery (asymmetric compared to left) probably related to asymmetric venous drainage rather than a fistula.  Carotid arteries without significant narrowing or irregularity.  Interbody fusion C5-6 through C7-T1. Posterior hardware has been removed when compared to 12/12/2013 CT. Partial left laminectomy and facetectomy C6-7 level. Small amount of fluid surrounds the operative site most notable spinous process region. This may represent expected findings.  Infection would be difficult to exclude if of high clinical concern.  Cervical spondylotic changes without significant spinal stenosis/cord compression. If postoperative complication is of concern, MR of the cervical spine may be considered.  Bullae lung apices. Septal thickening/ scarring appears more prominent than on the 05/11/2013 exam (see series 5, image 41). This may be related to the dependent edema. Follow-up chest CTA in 3-6 months recommended.   Electronically Signed   By: Chauncey Cruel M.D.   On: 12/28/2013 17:33    Scheduled Meds: . nortriptyline  10 mg Oral BID  . OxyCODONE  40 mg Oral Q12H  . sodium chloride  3 mL Intravenous Q12H  . tadalafil  5 mg Oral Daily   Continuous Infusions:   Active Problems:   Multilevel spondylosis   ED (erectile dysfunction)   Cervical spondylosis with radiculopathy   TIA (transient ischemic attack)    Time spent: [facility] Pager [phone]. If 7PM-7AM, please contact night-coverage at [email or web address], password [email or web address] 12/29/2013, 10:11 AM  LOS: 1 day

## 2013-12-30 ENCOUNTER — Inpatient Hospital Stay (HOSPITAL_COMMUNITY): Payer: 59

## 2013-12-30 DIAGNOSIS — R930 Abnormal findings on diagnostic imaging of skull and head, not elsewhere classified: Secondary | ICD-10-CM

## 2013-12-30 LAB — CBC
HCT: 36.4 % — ABNORMAL LOW (ref 39.0–52.0)
HEMOGLOBIN: 12 g/dL — AB (ref 13.0–17.0)
MCH: 31.3 pg (ref 26.0–34.0)
MCHC: 33 g/dL (ref 30.0–36.0)
MCV: 94.8 fL (ref 78.0–100.0)
PLATELETS: 367 10*3/uL (ref 150–400)
RBC: 3.84 MIL/uL — AB (ref 4.22–5.81)
RDW: 13.1 % (ref 11.5–15.5)
WBC: 8.3 10*3/uL (ref 4.0–10.5)

## 2013-12-30 LAB — BASIC METABOLIC PANEL
BUN: 7 mg/dL (ref 6–23)
CHLORIDE: 104 meq/L (ref 96–112)
CO2: 25 meq/L (ref 19–32)
Calcium: 8.8 mg/dL (ref 8.4–10.5)
Creatinine, Ser: 0.77 mg/dL (ref 0.50–1.35)
GFR calc Af Amer: >90 mL/min (ref >90)
GFR calc non Af Amer: >90 mL/min (ref >90)
GLUCOSE: 108 mg/dL — AB (ref 70–99)
Potassium: 3.9 mEq/L (ref 3.7–5.3)
SODIUM: 143 meq/L (ref 137–147)

## 2013-12-30 MED ORDER — ASPIRIN 81 MG PO CHEW: 81.0000 mg | CHEWABLE_TABLET | Freq: Every day | ORAL | Status: DC

## 2013-12-30 NOTE — Progress Notes (Signed)
PT Cancellation Note  Patient Details Name: Bradley Rice MRN: [id number] DOB: 1961-03-21   Cancelled Treatment:     PT order received. Attempted to see pt and he was gone for a test. Will try again later today.   Colon Flattery Ernestine Conrad 12/30/2013, 10:41 AM

## 2013-12-30 NOTE — Evaluation (Signed)
Occupational Therapy Evaluation Patient Details Name: Bradley Rice MRN: [id number] DOB: 08/31/1960 Today's Date: 12/30/2013        Clinical Impression   Patient is a 53 y/o male s/p TIA symptoms presenting to acute OT with left side weakness. Patient is completing ADL tasks at baseline with some weakness noted on left side. Recommend Home Health OT to generalized strengthening program.   Follow Up Recommendations  Home health OT    Equipment Recommendations  Tub/shower seat    Recommendations for Other Services  None     Precautions / Restrictions Precautions Precautions: Fall Restrictions Weight Bearing Restrictions: No      Mobility Bed Mobility Overal bed mobility: Independent               Transfers Overall transfer level: Independent Equipment used: None             General transfer comment: Patient states he would feel safer using a walker to go to and from bathroom although no assistive device was used during OT eval.    Balance                                            ADL Overall ADL's : Independent                                       General ADL Comments: Patient sat on edge of the bed and walked back and forth from bed to bathroom than to recliner without assistance or assistive device. Very slow moving as to not lose balance.                Pertinent Vitals/Pain 5/10 posterior neck     Hand Dominance Left   Extremity/Trunk Assessment Upper Extremity Assessment Upper Extremity Assessment: LUE deficits/detail LUE Deficits / Details: MMT: 3+/5 shoulder ranges. Decreased gross grasp strength.  LUE: Unable to fully assess due to pain (neck pain hindered full assessement.)   Lower Extremity Assessment Lower Extremity Assessment: Defer to PT evaluation       Communication Communication Communication: No difficulties   Cognition Arousal/Alertness: Awake/alert Behavior During Therapy: WFL for  tasks assessed/performed Overall Cognitive Status: Within Functional Limits for tasks assessed               Home Living Family/patient expects to be discharged to:: Private residence Living Arrangements: Spouse/significant other Available Help at Discharge: Family;Friend(s) (Neighbors are home next door) Type of Home: House Home Access: Stairs to enter [employer] of Steps: 4 Entrance Stairs-Rails: None Home Layout: One level     Bathroom Shower/Tub: Corporate investment banker: Standard     Home Equipment: Cane - single point   Additional Comments: Uses cane 90% of the time.       Prior Functioning/Environment Level of Independence: Independent with assistive device(s)             OT Diagnosis: Generalized weakness   OT Problem List: Decreased strength;Impaired balance (sitting and/or standing);Decreased coordination            Barriers to D/C:  None             End of Session    Activity Tolerance: Patient tolerated treatment well Patient left: in chair;with call bell/phone within reach  Time: 1464-3142 OT Time Calculation (min): 18 min Charges:  OT General Charges $OT Visit: 1 Procedure OT Evaluation $Initial OT Evaluation Tier I: 1 Procedure  Ailene Ravel, OTR/L,CBIS   12/30/2013, 10:12 AM

## 2013-12-30 NOTE — Progress Notes (Signed)
Dr. Dillard Essex in to see patient. MD states conversation with patient was pleasant, and she explained that she would then discharge him home.  MD also made patient aware that she would complete discharge paperwork and then RN would be in to go over paperwork with him prior to discharge. Pt verbalized understanding. Upon entering patient's room, pt noted to not be in room and all belongings missing from room. Housekeeper stated that she saw a man and woman leave from the room carrying patient belongings bags.  Dr. Dillard Essex made aware.

## 2013-12-30 NOTE — Discharge Summary (Signed)
Physician Discharge Summary  Bradley Rice BDZ:[id number] DOB: Mar 23, 1961 DOA: 12/28/2013  PCP: [name], MD  Admit date: 12/28/2013 Discharge date: 12/30/2013  Time spent: <30 minutes  Recommendations for Outpatient Follow-up:  Follow-up Information   Follow up with [name], MD. (next week, call for appt upon discharge)    Specialty:  Family Medicine   Contact information:   [place] [address] [phone]       Follow up with [name], MD On 01/02/2014. (as scheduled)    Specialty:  Orthopedic Surgery   Contact information:   [place] [address] [phone]        Discharge Diagnoses:  Active Problems:   Multilevel spondylosis   ED (erectile dysfunction)   Cervical spondylosis with radiculopathy   TIA (transient ischemic attack)   Discharge Condition: improved/stable  Diet recommendation: heart healthy  Filed Weights   12/28/13 2235 12/29/13 0630  Weight: 78.926 kg (174 lb) 73.528 kg (162 lb 1.6 oz)    History of present illness:  Pt was admitted 53 year old man who recently had neck surgery on 12/17/2013. This involved the following procedure:REMOVAL OF POSTERIOR CERVICAL FUSION LATERAL MASS SCREWS AND RODS C5-T1, EXPLORE FUSION, LEFT SIX-SEVEN FORAMINOTOMY .  He was discharged home soon and seemed to do well. In the last 24 hours he has had bleeding from the incision site but this is resolved at the present time. However approximately 3 days ago he had an episode where he suddenly lost his memory and did not know where he was. After the episode he felt very drained and tired. He had another episode today. Again he felt drained and tired. He also tells me that he has noticed weakness in the left arm and left leg today. He tells me also that his speech has been altered-he is unable to say the words he wants and apparently it is slurred. He also tells me that people have said to him that they cannot  understand what he is saying.  He is on multiple opioids and also benzodiazepines. He tells me that there has been absolutely no change in these doses.   Hospital Course:  1.AMS- ?TIA vs demyelinating process - As discussed above upon admission carotid Dopplers were done and showed no significant ICA stenosis. Bradley Rice was started on aspirin today. MRI was done today and showed no acute intracranial abnormality, multiple bilateral sub-cortical T2 hyperintensities are advanced for age-? demyelinating process versus infectious versus inflammatory process. Neurology was consulted on admission and eval still pending at this time, I called Dr. Merlene Laughter as Bradley Rice was insisting that he had to leave before none today and he states at the earliest he can see Bradley Rice would be after 5pm. I discussed the MRI results and he/Dr Merlene Laughter states that in order to give any recommendations he would have to examined Bradley Rice and review MRI himself, and that if Bradley Rice insists on being discharged-to discharge him and have him follow up at his office outpatient. I discussed this with the Bradley Rice and his significant other and he voiced understanding and states that he prefers to followup with his outpatient MDs/Dr.Nitka and be referred to a neurologist in [place] as his other  M.Ds are in [place]. -He was seen by PT OT and no further skilled needs recommended 2.Cervical spondylosis with radiculopathy, s/p recent surgery per Dr Louanne Skye  -continue pain management  -He is to followup with Dr. Louanne Skye on 6/4 as scheduled 3.Hypotension  -hgb stable  -  cialis was DC'd in the hospital - hypotension resolved with IV fluids -He is to followup with his PCP on discharge.   Procedures:  Carotid Doppler as IMPRESSION:  Minimal amount of intimal thickening and atherosclerotic plaque,  right subjectively greater than left, not resulting in a  hemodynamically significant stenosis.   Consultations:  Neuro via phone as  above  Discharge Exam: Filed Vitals:   12/30/13 0453  BP: 110/75  Pulse: 86  Temp: 98.7 F (37.1 C)  Resp: 20   Exam:  General: alert & oriented x 3 In NAD  HEENT:posterior cervical incision clean and dry, healing well  Cardiovascular: RRR, nl S1 s2  Respiratory: CTAB  Abdomen: soft +BS NT/ND, no masses palpable  Extremities: No cyanosis and no edema  Neuro: non focal    Discharge Instructions You were cared for by a hospitalist during your hospital stay. If you have any questions about your discharge medications or the care you received while you were in the hospital after you are discharged, you can call the unit and asked to speak with the hospitalist on call if the hospitalist that took care of you is not available. Once you are discharged, your primary care physician will handle any further medical issues. Please note that NO REFILLS for any discharge medications will be authorized once you are discharged, as it is imperative that you return to your primary care physician (or establish a relationship with a primary care physician if you do not have one) for your aftercare needs so that they can reassess your need for medications and monitor your lab values.  Discharge Instructions   Diet general    Complete by:  As directed      Increase activity slowly    Complete by:  As directed             Medication List         aspirin 81 MG chewable tablet  Chew 1 tablet (81 mg total) by mouth daily.     clonazePAM 0.5 MG tablet  Commonly known as:  KLONOPIN  Take 0.5 mg by mouth 2 (two) times daily as needed for anxiety.     methocarbamol 500 MG tablet  Commonly known as:  ROBAXIN  Take 500 mg by mouth every 6 (six) hours as needed for muscle spasms.     morphine 15 MG tablet  Commonly known as:  MSIR  Take 15 mg by mouth every 6 (six) hours as needed for moderate pain or severe pain.     nortriptyline 10 MG capsule  Commonly known as:  PAMELOR  Take 10 mg by mouth 2  (two) times daily.     OxyCODONE 40 mg T12a 12 hr tablet  Commonly known as:  OXYCONTIN  Take 1 tablet (40 mg total) by mouth every 12 (twelve) hours.     oxyCODONE-acetaminophen 7.5-325 MG per tablet  Commonly known as:  PERCOCET  Take 1 tablet by mouth every 6 (six) hours as needed for pain.     tadalafil 5 MG tablet  Commonly known as:  CIALIS  Take 1 tablet (5 mg total) by mouth daily.     zolpidem 10 MG tablet  Commonly known as:  AMBIEN  Take 10 mg by mouth at bedtime as needed for sleep.       No Known Allergies     Follow-up Information   Follow up with [name], MD. (next week, call for appt upon discharge)    Specialty:  Family Medicine   Contact information:   [place] [address] [phone]       Follow up with [name], MD On 01/02/2014. (as scheduled)    Specialty:  Orthopedic Surgery   Contact information:   [place] [address] [phone]        The results of significant diagnostics from this hospitalization (including imaging, microbiology, ancillary and laboratory) are listed below for reference.    Significant Diagnostic Studies: Dg Chest 2 View  12/11/2013   CLINICAL DATA:  Coronary artery disease; preoperative evaluation  EXAM: CHEST  2 VIEW  COMPARISON:  May 11, 2013 chest radiograph and chest CT  FINDINGS: There is underlying emphysematous change with lower lobe bronchiectasis. There is mild scarring in both mid and lower lung zones. There is no frank edema or consolidation. The heart size is normal. The pulmonary vascularity reflects underlying emphysema. No adenopathy. There is postoperative change in the lower cervical spine.  IMPRESSION: Underlying emphysematous change with bilateral lower lobe bronchiectatic change and scattered areas of scarring. No edema or consolidation.   Electronically Signed   By: Lowella Grip M.D.   On: 12/11/2013 11:29   Ct Head Wo  Contrast  12/28/2013   CLINICAL DATA:  LOSS OF CONSCIOUSNESS LOSS OF CONSCIOUSNESS  EXAM: CT HEAD WITHOUT CONTRAST  TECHNIQUE: Contiguous axial images were obtained from the base of the skull through the vertex without intravenous contrast.  COMPARISON:  None.  FINDINGS: No acute intracranial hemorrhage. No focal mass lesion. No CT evidence of acute infarction. No midline shift or mass effect. No hydrocephalus. Basilar cisterns are patent. Paranasal sinuses and mastoid air cells are clear. Prior sinus surgery. Mild mucosal inflammation the sphenoid sinus  IMPRESSION: No acute intracranial findings.   Electronically Signed   By: Suzy Bouchard M.D.   On: 12/28/2013 16:58   Ct Angio Neck W/cm &/or Wo/cm  12/28/2013   CLINICAL DATA:  Passed out.  Recent neck surgery.  EXAM: CT ANGIOGRAPHY NECK  TECHNIQUE: Multidetector CT imaging of the neck was performed using the standard protocol during bolus administration of intravenous contrast. Multiplanar CT image reconstructions and MIPs were obtained to evaluate the vascular anatomy. Carotid stenosis measurements (when applicable) are obtained utilizing NASCET criteria, using the distal internal carotid diameter as the denominator.  CONTRAST:  70mL OMNIPAQUE IOHEXOL 350 MG/ML SOLN  COMPARISON:  12/28/2013 head CT. 12/12/2013 neck CT. 05/11/2013 chest CT.  FINDINGS: Left vertebral artery is dominant arising directly from the aortic arch. Mild narrowing proximal left vertebral artery. Otherwise left vertebral artery is unremarkable.  Right vertebral artery is patent. At the C1-2 level there are veins associated with the right vertebral artery (asymmetric compared to left) probably related to asymmetric venous drainage rather than a fistula.  Carotid arteries without significant narrowing or irregularity.  Interbody fusion C5-6 through C7-T1. Posterior hardware has been removed when compared to 12/12/2013 CT. Partial left laminectomy and facetectomy C6-7 level. Small  amount of fluid surrounds the operative site most notable spinous process region. This may represent expected findings. Infection would be difficult to exclude if of high clinical concern.  Cervical spondylotic changes without significant spinal stenosis/cord compression. If postoperative complication is of concern, MR of the cervical spine may be considered.  No prevertebral abnormality. No neck abscess or fluid collection. Scattered normal/top-normal size lymph nodes.  Bullae lung apices. Septal thickening/ scarring appears more prominent than on the 05/11/2013 exam (see series 5, image 41). This may be  related to the dependent edema. Follow-up chest CTA in 3-6 months recommended.  Review of the MIP images confirms the above findings.  IMPRESSION: Left vertebral artery is dominant arising directly from the aortic arch. Mild narrowing proximal left vertebral artery. Otherwise left vertebral artery is unremarkable.  Right vertebral artery is patent. At the C1-2 level there are veins associated with the right vertebral artery (asymmetric compared to left) probably related to asymmetric venous drainage rather than a fistula.  Carotid arteries without significant narrowing or irregularity.  Interbody fusion C5-6 through C7-T1. Posterior hardware has been removed when compared to 12/12/2013 CT. Partial left laminectomy and facetectomy C6-7 level. Small amount of fluid surrounds the operative site most notable spinous process region. This may represent expected findings. Infection would be difficult to exclude if of high clinical concern.  Cervical spondylotic changes without significant spinal stenosis/cord compression. If postoperative complication is of concern, MR of the cervical spine may be considered.  Bullae lung apices. Septal thickening/ scarring appears more prominent than on the 05/11/2013 exam (see series 5, image 41). This may be related to the dependent edema. Follow-up chest CTA in 3-6 months recommended.    Electronically Signed   By: Chauncey Cruel M.D.   On: 12/28/2013 17:33   Ct Cervical Spine W Contrast  12/12/2013   CLINICAL DATA:  Recurrent left neck pain that burning sensation is back and numbness in the left upper extremity extending of the hand. Headaches.  FLUOROSCOPY TIME:  1 min 2 seconds  PROCEDURE: LUMBAR PUNCTURE FOR CERVICAL MYELOGRAM  After thorough discussion of risks and benefits of the procedure including bleeding, infection, injury to nerves, blood vessels, adjacent structures as well as headache and CSF leak, written and oral informed consent was obtained. Consent was obtained by Dr. Lawrence Santiago. We discussed the high likelihood of obtaining a diagnostic study.  Bradley Rice was positioned prone on the fluoroscopy table. Local anesthesia was provided with 1% lidocaine without epinephrine after prepped and draped in the usual sterile fashion. Puncture was performed at L2-3 using a 3 1/2 inch 22-gauge spinal needle via right paramedian approach. Using a single pass through the dura, the needle was placed within the thecal sac, with return of clear CSF. 10 mL of Omnipaque-300 was injected into the thecal sac, with normal opacification of the nerve roots and cauda equina consistent with free flow within the subarachnoid space. The Bradley Rice was then moved to the trendelenburg position and contrast flowed into the Cervical spine region.  I personally performed the lumbar puncture and administered the intrathecal contrast. I also personally supervised acquisition of the myelogram images.  TECHNIQUE: Contiguous axial images were obtained through the Cervical spine after the intrathecal infusion of infusion. Coronal and sagittal reconstructions were obtained of the axial image sets.  FINDINGS: CERVICAL MYELOGRAM FINDINGS:  The Bradley Rice is fused at C5-6 and C6-7 with anterior disc spacers. Lateral mass screw and rod fixation is present from C5-6 through C7-T1. There is no C6 screw on the left. The hardware is  intact.  There is early truncation of the left C5 nerve root, better visualize than on the prior exam. The nerve roots fill normally through the fused segments.  CT CERVICAL MYELOGRAM FINDINGS:  And the cervical spine is imaged from the skullbase through T3. The Bradley Rice is fused at C5-6, C6-7, and C7-T1. Solid bridging bone is evident across the disc space at these levels. Lateral mass and screw fixation is present at C5-6, C6-7, and C7-T1.  C2-3: Mild leftward disc  protrusion and uncovertebral spurring is stable without significant stenosis.  C3-4: A broad-based disc protrusion partially effaces the ventral CSF. Mild right foraminal narrowing is stable due to uncovertebral disease.  C4-5: A broad-based disc osteophyte complex is again seen. Mild left foraminal narrowing is due to uncovertebral and facet hypertrophy.  C5-6: The Bradley Rice is fused anteriorly and posteriorly. There is no significant stenosis. Previously seen lucency about the right L5 screw has resolved.  C6-7: Right pedicle screw extends into the facet joint without nerve root compromise. The Bradley Rice is fused anteriorly. Mild left foraminal narrowing is due to residual uncovertebral spurring. The Bradley Rice is fused anteriorly. Solid bridging bone is evident.  C7-T1: Bradley Rice is fused anteriorly. Solid bridging bone is present across the disc space. Right-sided uncovertebral spurring leads to stable mild right foraminal narrowing. The central canal and left foramen are patent.  T1-2:  Negative.  Paraseptal emphysematous changes are evident.  IMPRESSION: 1. Fusion at C5-6, C6-7 and C7-T1. 2. Lateral mass screws at the same levels. The right C6 screw enters into the right C6-7 facet joint without nerve root compromise. 3. The lateral mass screws are otherwise contained within bone. 4. Mild central and right foraminal narrowing at C3-4 is stable. 5. Mild central and left foraminal stenosis at C4-5 is stable. 6. Mild right foraminal narrowing at C7-T1 due to  right-sided uncovertebral spurring.   Electronically Signed   By: Lawrence Santiago M.D.   On: 12/12/2013 14:01   Mr Brain Wo Contrast  12/30/2013   CLINICAL DATA:  Loss of consciousness. Syncopal episode. Recent cervical spine surgery.  EXAM: MRI HEAD WITHOUT CONTRAST  TECHNIQUE: Multiplanar, multiecho pulse sequences of the brain and surrounding structures were obtained without intravenous contrast.  COMPARISON:  CT head without contrast 12/28/2013.  FINDINGS: Multiple subcortical T2 hyperintensities are present bilaterally. The majority these are subcortical with 2 or 3 lesions involving the colossal septal margin. A corpus callosum is intact.  No acute infarct, hemorrhage, or mass lesion is present. Insert past ventricle No significant extraaxial fluid collection is present.  Flow is present in the major intracranial arteries. The globes and orbits are intact. Mild mucosal thickening is present in the and maxillary sinus, worse on the left. The left sphenoid sinus is partially opacified. There is scattered opacification of ethmoid air cells. Mucosal thickening in the frontal sinuses is worse on the right. The mastoid air cells are clear.  IMPRESSION: 1. No acute intracranial abnormality. 2. Multiple bilateral subcortical T2 hyperintensities are advanced for age. The finding is nonspecific but can be seen in the setting of chronic microvascular ischemia, a demyelinating process such as multiple sclerosis, vasculitis, complicated migraine headaches, or as the sequelae of a prior infectious or inflammatory process. 3. Scattered sinus disease as described.   Electronically Signed   By: Lawrence Santiago M.D.   On: 12/30/2013 09:03   US Carotid Bilateral  12/29/2013   CLINICAL DATA:  Syncopal episode, possible TIA, history of CAD  EXAM: BILATERAL CAROTID DUPLEX ULTRASOUND  TECHNIQUE: Pearline Cables scale imaging, color Doppler and duplex ultrasound were performed of bilateral carotid and vertebral arteries in the neck.   COMPARISON:  None.  FINDINGS: Criteria: Quantification of carotid stenosis is based on velocity parameters that correlate the residual internal carotid diameter with NASCET-based stenosis levels, using the diameter of the distal internal carotid lumen as the denominator for stenosis measurement.  The following velocity measurements were obtained:  RIGHT  ICA:  121/36 cm/sec  CCA:  237/62 cm/sec  SYSTOLIC ICA/CCA  RATIO:  1.3  DIASTOLIC ICA/CCA RATIO:  1.5  ECA:  164 cm/sec  LEFT  ICA:  98/27 cm/sec  CCA:  277/41 cm/sec  SYSTOLIC ICA/CCA RATIO:  0.5  DIASTOLIC ICA/CCA RATIO:  2.87  ECA:  112 cm/sec  RIGHT CAROTID ARTERY: There is a minimal amount of intimal wall thickening and mixed echogenic, largely hypoechoic plaque within the right carotid bulb (image 19), not resulting in elevated peak systolic velocities within the interrogated course of the right internal carotid artery to suggest a hemodynamically significant stenosis.  RIGHT VERTEBRAL ARTERY:  Antegrade flow  LEFT CAROTID ARTERY: There is a minimal amount of intimal thickening in hypoechoic plaque within the left carotid bulb (image 54), not resulting in elevated peak systolic velocities within interrogated course of the left internal carotid artery to suggest a hemodynamically significant stenosis.  LEFT VERTEBRAL ARTERY:  Antegrade flow  IMPRESSION: Minimal amount of intimal thickening and atherosclerotic plaque, right subjectively greater than left, not resulting in a hemodynamically significant stenosis.   Electronically Signed   By: Sandi Mariscal M.D.   On: 12/29/2013 12:39   Dg Myelography Lumbar Inj Cervical  12/12/2013   CLINICAL DATA:  Recurrent left neck pain that burning sensation is back and numbness in the left upper extremity extending of the hand. Headaches.  FLUOROSCOPY TIME:  1 min 2 seconds  PROCEDURE: LUMBAR PUNCTURE FOR CERVICAL MYELOGRAM  After thorough discussion of risks and benefits of the procedure including bleeding, infection,  injury to nerves, blood vessels, adjacent structures as well as headache and CSF leak, written and oral informed consent was obtained. Consent was obtained by Dr. Lawrence Santiago. We discussed the high likelihood of obtaining a diagnostic study.  Bradley Rice was positioned prone on the fluoroscopy table. Local anesthesia was provided with 1% lidocaine without epinephrine after prepped and draped in the usual sterile fashion. Puncture was performed at L2-3 using a 3 1/2 inch 22-gauge spinal needle via right paramedian approach. Using a single pass through the dura, the needle was placed within the thecal sac, with return of clear CSF. 10 mL of Omnipaque-300 was injected into the thecal sac, with normal opacification of the nerve roots and cauda equina consistent with free flow within the subarachnoid space. The Bradley Rice was then moved to the trendelenburg position and contrast flowed into the Cervical spine region.  I personally performed the lumbar puncture and administered the intrathecal contrast. I also personally supervised acquisition of the myelogram images.  TECHNIQUE: Contiguous axial images were obtained through the Cervical spine after the intrathecal infusion of infusion. Coronal and sagittal reconstructions were obtained of the axial image sets.  FINDINGS: CERVICAL MYELOGRAM FINDINGS:  The Bradley Rice is fused at C5-6 and C6-7 with anterior disc spacers. Lateral mass screw and rod fixation is present from C5-6 through C7-T1. There is no C6 screw on the left. The hardware is intact.  There is early truncation of the left C5 nerve root, better visualize than on the prior exam. The nerve roots fill normally through the fused segments.  CT CERVICAL MYELOGRAM FINDINGS:  And the cervical spine is imaged from the skullbase through T3. The Bradley Rice is fused at C5-6, C6-7, and C7-T1. Solid bridging bone is evident across the disc space at these levels. Lateral mass and screw fixation is present at C5-6, C6-7, and C7-T1.   C2-3: Mild leftward disc protrusion and uncovertebral spurring is stable without significant stenosis.  C3-4: A broad-based disc protrusion partially effaces the ventral CSF. Mild right foraminal narrowing is stable  due to uncovertebral disease.  C4-5: A broad-based disc osteophyte complex is again seen. Mild left foraminal narrowing is due to uncovertebral and facet hypertrophy.  C5-6: The Bradley Rice is fused anteriorly and posteriorly. There is no significant stenosis. Previously seen lucency about the right L5 screw has resolved.  C6-7: Right pedicle screw extends into the facet joint without nerve root compromise. The Bradley Rice is fused anteriorly. Mild left foraminal narrowing is due to residual uncovertebral spurring. The Bradley Rice is fused anteriorly. Solid bridging bone is evident.  C7-T1: Bradley Rice is fused anteriorly. Solid bridging bone is present across the disc space. Right-sided uncovertebral spurring leads to stable mild right foraminal narrowing. The central canal and left foramen are patent.  T1-2:  Negative.  Paraseptal emphysematous changes are evident.  IMPRESSION: 1. Fusion at C5-6, C6-7 and C7-T1. 2. Lateral mass screws at the same levels. The right C6 screw enters into the right C6-7 facet joint without nerve root compromise. 3. The lateral mass screws are otherwise contained within bone. 4. Mild central and right foraminal narrowing at C3-4 is stable. 5. Mild central and left foraminal stenosis at C4-5 is stable. 6. Mild right foraminal narrowing at C7-T1 due to right-sided uncovertebral spurring.   Electronically Signed   By: Lawrence Santiago M.D.   On: 12/12/2013 14:01    Microbiology: No results found for this or any previous visit (from the past 240 hour(s)).   Labs: Basic Metabolic Panel:  Recent Labs Lab 12/28/13 1534 12/29/13 0603 12/30/13 0551  NA 138 142 143  K 3.7 4.0 3.9  CL 97 101 104  CO2 28 27 25   GLUCOSE 127* 98 108*  BUN 5* 6 7  CREATININE 0.73 0.77 0.77  CALCIUM 9.4  9.3 8.8   Liver Function Tests:  Recent Labs Lab 12/28/13 1534 12/29/13 0603  AST 12 11  ALT 11 10  ALKPHOS 88 74  BILITOT 0.5 0.3  PROT 8.2 7.1  ALBUMIN 3.9 3.3*   No results found for this basename: LIPASE, AMYLASE,  in the last 168 hours No results found for this basename: AMMONIA,  in the last 168 hours CBC:  Recent Labs Lab 12/28/13 1534 12/29/13 0603 12/30/13 0551  WBC 13.3* 7.9 8.3  NEUTROABS 11.2*  --   --   HGB 13.5 12.6* 12.0*  HCT 39.4 37.8* 36.4*  MCV 93.8 94.7 94.8  PLT 380 375 367   Cardiac Enzymes: No results found for this basename: CKTOTAL, CKMB, CKMBINDEX, TROPONINI,  in the last 168 hours BNP: BNP (last 3 results) No results found for this basename: PROBNP,  in the last 8760 hours CBG: No results found for this basename: GLUCAP,  in the last 168 hours     Signed:  Lavona Norsworthy C Jamarious Febo  [facility] 12/30/2013, 12:07 PM

## 2013-12-30 NOTE — Evaluation (Signed)
Physical Therapy Evaluation Patient Details Name: Bradley Rice MRN: [id number] DOB: 28-Jul-1961 Today's Date: 12/30/2013   History of Present Illness  53 year old man who recently had neck surgery on 12/17/2013. This involved the following procedure:REMOVAL OF POSTERIOR CERVICAL FUSION LATERAL MASS SCREWS AND RODS C5-T1, EXPLORE FUSION, LEFT SIX-SEVEN FORAMINOTOMY .  Clinical Impression  Pt demonstrated independence with mobility on the unit and no lose of balance noted. Pt reported he ambulates with a single point cane and it was misplaced here in the hospital. Pt reports he will not buy another one and he wants his cane to be located or replaced by the hospital. Pt did ambulate on the unit without a cane and was independent. Pt reports his balance does fluctuate requiring the cane for safety and independence. Pt reported he is not suppose do do any cervical ROM exercises until he return to his surgeon. Pt stated he is suppose to wear a hard collar for driving and a soft collar for home. He reported his soft collar was also misplaced here int he hospital. Pt does not have a skilled PT need at this time and recommend d/c home when medically stable and no follow-up PT indicated.    Follow Up Recommendations No PT follow up    Equipment Recommendations  Cane    Recommendations for Other Services       Precautions / Restrictions Precautions Precautions: Cervical Precaution Comments: recent cervical surgery. Pt reports no ROM unitl follow-up visit and should be using a hard collar for driving and soft collar at home. Pt reported the soft collar was misplaced in the hospital. Required Braces or Orthoses: Cervical Brace Cervical Brace: Hard collar;Soft collar (per patient no order found) Restrictions Weight Bearing Restrictions: No      Mobility  Bed Mobility Overal bed mobility: Independent                Transfers Overall transfer level: Independent Equipment used: None              General transfer comment: Patient states he would feel safer using a walker to go to and from bathroom although no assistive device was used during OT eval.  Ambulation/Gait Ambulation/Gait assistance: Independent Ambulation Distance (Feet): 300 Feet Assistive device: None Gait Pattern/deviations:  (pigeon toed gate pattern)   Gait velocity interpretation: at or above normal speed for age/gender General Gait Details: Pt reports he uses a cance for balance. Pt demonstrated good balance on level surfaces on the unit with no assistive device. Pt reports he lost his cane in the hospital and he is not going to buy another one.   Stairs            Wheelchair Mobility    Modified Rankin (Stroke Patients Only)       Balance Overall balance assessment: No apparent balance deficits (not formally assessed)                                           Pertinent Vitals/Pain     Home Living Family/patient expects to be discharged to:: Private residence Living Arrangements: Spouse/significant other Available Help at Discharge: Family Type of Home: House Home Access: Stairs to enter Entrance Stairs-Rails: None Entrance Stairs-Number of Steps: 4 Home Layout: One level Home Equipment: Cane - single point Additional Comments: Uses cane 90% of the time.     Prior Function Level of  Independence: Independent with assistive device(s)               Hand Dominance   Dominant Hand: Left    Extremity/Trunk Assessment   Upper Extremity Assessment: Defer to OT evaluation       LUE Deficits / Details: MMT: 3+/5 shoulder ranges. Decreased gross grasp strength.    Lower Extremity Assessment: Overall WFL for tasks assessed      Cervical / Trunk Assessment: Other exceptions  Communication   Communication: No difficulties  Cognition Arousal/Alertness: Awake/alert Behavior During Therapy: WFL for tasks assessed/performed Overall Cognitive Status: Within  Functional Limits for tasks assessed                      General Comments      Exercises        Assessment/Plan    PT Assessment Patent does not need any further PT services  PT Diagnosis     PT Problem List    PT Treatment Interventions     PT Goals (Current goals can be found in the Care Plan section)      Frequency     Barriers to discharge        Co-evaluation               End of Session   Activity Tolerance: Patient tolerated treatment well Patient left: in chair;with family/visitor present Nurse Communication:  (pt is independent)         Time: 4008-6761 PT Time Calculation (min): 16 min   Charges:   PT Evaluation $Initial PT Evaluation Tier I: 1 Procedure     PT G Codes:          Lelon Mast 12/30/2013, 11:31 AM

## 2014-02-28 ENCOUNTER — Ambulatory Visit (INDEPENDENT_AMBULATORY_CARE_PROVIDER_SITE_OTHER): Payer: 59 | Admitting: Family

## 2014-02-28 ENCOUNTER — Encounter: Payer: Self-pay | Admitting: Family

## 2014-02-28 VITALS — BP 135/78 | HR 96 | Temp 97.8°F | Ht 72.0 in | Wt 167.0 lb

## 2014-02-28 DIAGNOSIS — T889XXS Complication of surgical and medical care, unspecified, sequela: Secondary | ICD-10-CM

## 2014-02-28 DIAGNOSIS — M479 Spondylosis, unspecified: Secondary | ICD-10-CM

## 2014-02-28 DIAGNOSIS — M47812 Spondylosis without myelopathy or radiculopathy, cervical region: Secondary | ICD-10-CM

## 2014-02-28 DIAGNOSIS — M4722 Other spondylosis with radiculopathy, cervical region: Secondary | ICD-10-CM

## 2014-02-28 DIAGNOSIS — M47819 Spondylosis without myelopathy or radiculopathy, site unspecified: Secondary | ICD-10-CM

## 2014-02-28 DIAGNOSIS — IMO0001 Reserved for inherently not codable concepts without codable children: Secondary | ICD-10-CM

## 2014-02-28 DIAGNOSIS — G44009 Cluster headache syndrome, unspecified, not intractable: Secondary | ICD-10-CM

## 2014-02-28 DIAGNOSIS — T849XXS Unspecified complication of internal orthopedic prosthetic device, implant and graft, sequela: Secondary | ICD-10-CM

## 2014-02-28 NOTE — Patient Instructions (Signed)
Cluster Headache Cluster headaches are recognized by their pattern of deep, intense head pain. They normally occur on one side of your head, but they may "switch sides" in subsequent episodes. Typically, cluster headaches:   Are severe in nature.   Occur repeatedly over weeks to months and are followed by periods of no headaches.   Can last from 15 minutes to 3 hours.   Occur at the same time each day, often at night.   Occur several times a day. CAUSES The exact cause of cluster headaches is not known. Alcohol use may be associated with cluster headaches. SIGNS AND SYMPTOMS   Severe pain that begins in or around your eye or temple.   One-sided head pain.   Feeling sick to your stomach (nauseous).   Sensitivity to light.   Runny nose.   Eye redness, tearing, and nasal stuffiness on the side of your head where you are experiencing pain.   Sweaty, pale skin of the face.   Droopy or swollen eyelid.   Restlessness. DIAGNOSIS  Cluster headaches are diagnosed based on symptoms and a physical exam. Your health care provider may order a CT scan or an MRI of your head or lab tests to see if your headaches are caused by other medical conditions.  TREATMENT   Medicines for pain relief and to prevent recurrent attacks. Some people may need a combination of medicines.  Oxygen for pain relief.   Biofeedback programs to help reduce headache pain.  It may be helpful to keep a headache diary. This may help you find a trend for what is triggering your headaches. Your health care provider can develop a treatment plan.  HOME CARE INSTRUCTIONS  During cluster periods:   Follow a regular sleep schedule. Do not vary the amount and time that you sleep from day to day. It is important to stay on the same schedule during a cluster period to help prevent headaches.   Avoid alcohol.   Stop smoking if you smoke.  SEEK MEDICAL CARE IF:  You have any changes from your previous  cluster headaches either in intensity or frequency.   You are not getting relief from medicines you are taking.  SEEK IMMEDIATE MEDICAL CARE IF:   You faint.   You have weakness or numbness, especially on one side of your body or face.   You have double vision.   You have nausea or vomiting that is not relieved within several hours.   You cannot keep your balance or have difficulty talking or walking.   You have neck pain or stiffness.   You have a fever. MAKE SURE YOU:  Understand these instructions.   Will watch your condition.   Will get help right away if you are not doing well or get worse. Document Released: 07/18/2005 Document Revised: 05/08/2013 Document Reviewed: 02/07/2013 [facility] Patient Information 2015 [place], [place]. This information is not intended to replace advice given to you by your health care provider. Make sure you discuss any questions you have with your health care provider.

## 2014-02-28 NOTE — Progress Notes (Signed)
   Subjective:    Patient ID: [name], male    DOB: Apr 21, 1961, 53 y.o.   MRN: [id number]  Headache  This is a new problem. The current episode started more than 1 month ago. The problem occurs intermittently. The problem has been gradually worsening. The pain is located in the right unilateral region. The pain radiates to the right neck and left neck. The pain quality is not similar to prior headaches. The quality of the pain is described as throbbing. The pain is at a severity of 10/10. The pain is severe. Associated symptoms include back pain, neck pain, scalp tenderness and weakness. Pertinent negatives include no abdominal pain, blurred vision, dizziness, ear pain, eye pain, eye redness, phonophobia, photophobia or visual change. He has tried oral narcotics for the symptoms. The treatment provided mild relief. His past medical history is significant for cluster headaches. There is no history of migraine headaches or migraines in the family.   *Pt states he has been told he had cluster headaches since been admitted to the hospital for a CVA. Pt see's Dr. Louanne Skye neurology for cervical spondylosis with radiculaopathy . Pt states his pain is not controlled and wants referral to pain clinic. Pt states he has had 7 different surgeries in the last 2 years and it has not helped the pain.     Review of Systems  HENT: Negative for ear pain.   Eyes: Negative for blurred vision, photophobia, pain and redness.  Respiratory: Negative.   Cardiovascular: Negative.   Gastrointestinal: Negative.  Negative for abdominal pain.  Endocrine: Negative.   Genitourinary: Negative.   Musculoskeletal: Positive for back pain, gait problem, joint swelling, myalgias, neck pain and neck stiffness.  Neurological: Positive for weakness and headaches. Negative for dizziness.  Hematological: Negative.   Psychiatric/Behavioral: Negative.   All other systems reviewed and are negative.      Objective:   Physical Exam    Vitals reviewed. Constitutional: He is oriented to person, place, and time. He appears well-developed and well-nourished. No distress.  Eyes: Pupils are equal, round, and reactive to light. Right eye exhibits no discharge. Left eye exhibits no discharge.  Neck: Normal range of motion. Neck supple. No thyromegaly present.  Cardiovascular: Normal rate, regular rhythm, normal heart sounds and intact distal pulses.   No murmur heard. Pulmonary/Chest: Effort normal and breath sounds normal. No respiratory distress. He has no wheezes.  Abdominal: Soft. Bowel sounds are normal. He exhibits no distension. There is no tenderness.  Musculoskeletal: Normal range of motion. He exhibits no edema and no tenderness.  Neurological: He is alert and oriented to person, place, and time. He has normal reflexes. No cranial nerve deficit.  Skin: Skin is warm and dry. No rash noted. No erythema.  Psychiatric: He has a normal mood and affect. His behavior is normal. Judgment and thought content normal.     BP 135/78  Pulse 96  Temp(Src) 97.8 F (36.6 C) (Oral)  Ht 6' (1.829 m)  Wt 167 lb (75.751 kg)  BMI 22.64 kg/m2      Assessment & Plan:  1. Cervical spondylosis with radiculopathy - Ambulatory referral to Pain Clinic  2. Multilevel spondylosis - Ambulatory referral to Pain Clinic  3. Failed cervical fusion, sequela - Ambulatory referral to Pain Clinic  4. Cluster headache, not intractable, unspecified chronicity pattern - Ambulatory referral to Pain Clinic  RTO prn  Keep all  Appointments with neurologists   Evelina Dun, FNP

## 2014-04-15 ENCOUNTER — Other Ambulatory Visit: Payer: Self-pay | Admitting: Specialist

## 2014-04-15 DIAGNOSIS — M549 Dorsalgia, unspecified: Secondary | ICD-10-CM

## 2014-04-15 DIAGNOSIS — M79605 Pain in left leg: Secondary | ICD-10-CM

## 2014-04-15 DIAGNOSIS — M79604 Pain in right leg: Secondary | ICD-10-CM

## 2014-04-21 ENCOUNTER — Ambulatory Visit
Admission: RE | Admit: 2014-04-21 | Discharge: 2014-04-21 | Disposition: A | Discharge status: Home / Self Care | Payer: 59 | Source: Ambulatory Visit | Attending: Specialist | Admitting: Specialist

## 2014-04-21 VITALS — BP 84/48 | HR 81

## 2014-04-21 DIAGNOSIS — M79604 Pain in right leg: Secondary | ICD-10-CM

## 2014-04-21 DIAGNOSIS — M549 Dorsalgia, unspecified: Secondary | ICD-10-CM

## 2014-04-21 DIAGNOSIS — M79605 Pain in left leg: Secondary | ICD-10-CM

## 2014-04-21 MED ORDER — ONDANSETRON HCL 4 MG/2ML IJ SOLN
4.0000 mg | Freq: Once | INTRAMUSCULAR | Status: AC
  Administered 2014-04-21: 4 mg via INTRAMUSCULAR

## 2014-04-21 MED ORDER — DIAZEPAM 5 MG PO TABS
10.0000 mg | ORAL_TABLET | Freq: Once | ORAL | Status: AC
  Administered 2014-04-21: 10 mg via ORAL

## 2014-04-21 MED ORDER — HYDROMORPHONE HCL 2 MG/ML IJ SOLN
1.5000 mg | Freq: Once | INTRAMUSCULAR | Status: AC
  Administered 2014-04-21: 1.5 mg via INTRAMUSCULAR

## 2014-04-21 MED ORDER — IOHEXOL 180 MG/ML  SOLN
15.0000 mL | Freq: Once | INTRAMUSCULAR | Status: AC | PRN
  Administered 2014-04-21: 15 mL via INTRATHECAL

## 2014-04-21 NOTE — Progress Notes (Signed)
Patient states he has been off Nortriptyline and Pamelor for at least the past two days.  Brita Romp, RN

## 2014-04-21 NOTE — Progress Notes (Signed)
Pt c/o severe headache but thinks it's his cluster headache. 2 l/[place] applied as this is what he uses at home. No c/o back pain.

## 2014-04-21 NOTE — Discharge Instructions (Signed)
Myelogram Discharge Instructions  1. Go home and rest quietly for the next 24 hours.  It is important to lie flat for the next 24 hours.  Get up only to go to the restroom.  You may lie in the bed or on a couch on your back, your stomach, your left side or your right side.  You may have one pillow under your head.  You may have pillows between your knees while you are on your side or under your knees while you are on your back.  2. DO NOT drive today.  Recline the seat as far back as it will go, while still wearing your seat belt, on the way home.  3. You may get up to go to the bathroom as needed.  You may sit up for 10 minutes to eat.  You may resume your normal diet and medications unless otherwise indicated.  Drink lots of extra fluids today and tomorrow.  4. The incidence of headache, nausea, or vomiting is about 5% (one in 20 patients).  If you develop a headache, lie flat and drink plenty of fluids until the headache goes away.  Caffeinated beverages may be helpful.  If you develop severe nausea and vomiting or a headache that does not go away with flat bed rest, call [phone].  5. You may resume normal activities after your 24 hours of bed rest is over; however, do not exert yourself strongly or do any heavy lifting tomorrow. If when you get up you have a headache when standing, go back to bed and force fluids for another 24 hours.  6. Call your physician for a follow-up appointment.  The results of your myelogram will be sent directly to your physician by the following day.  7. If you have any questions or if complications develop after you arrive home, please call [phone].  Discharge instructions have been explained to the patient.  The patient, or the person responsible for the patient, fully understands these instructions.      May resume Nortriptyline and Pamelor on Sept. 22, 2015, after 8:30 am

## 2014-04-28 ENCOUNTER — Ambulatory Visit: Payer: 59 | Admitting: Diagnostic Neuroimaging

## 2014-05-05 ENCOUNTER — Ambulatory Visit: Payer: 59 | Admitting: Diagnostic Neuroimaging

## 2014-05-09 ENCOUNTER — Encounter: Payer: Self-pay | Admitting: Diagnostic Neuroimaging

## 2014-05-09 ENCOUNTER — Ambulatory Visit (INDEPENDENT_AMBULATORY_CARE_PROVIDER_SITE_OTHER): Payer: 59 | Admitting: Diagnostic Neuroimaging

## 2014-05-09 ENCOUNTER — Encounter (INDEPENDENT_AMBULATORY_CARE_PROVIDER_SITE_OTHER): Payer: Self-pay

## 2014-05-09 VITALS — BP 110/78 | HR 98 | Ht 73.0 in | Wt 165.2 lb

## 2014-05-09 DIAGNOSIS — R51 Headache: Secondary | ICD-10-CM

## 2014-05-09 DIAGNOSIS — M4722 Other spondylosis with radiculopathy, cervical region: Secondary | ICD-10-CM

## 2014-05-09 DIAGNOSIS — G4486 Cervicogenic headache: Secondary | ICD-10-CM | POA: Insufficient documentation

## 2014-05-09 NOTE — Progress Notes (Signed)
[facility]  PATIENT: Bradley Rice DOB: 1960-09-08  REFERRING CLINICIAN: Louanne Skye HISTORY FROM: patient  REASON FOR VISIT: new consult    HISTORICAL  CHIEF COMPLAINT:  Chief Complaint  Patient presents with  . Headache  . Neck Pain    HISTORY OF PRESENT ILLNESS:   53 year old left-handed male with multiple neck and low back surgeries, here for evaluation of headaches.  Patient had last neck surgery 12/17/2013. Following this patient developed onset of right-sided headaches, spreading to the left side, with associated neck pain, left arm numbness and left arm weakness. No nausea vomiting. Patient has pre-existing photosensitivity which significantly worsened in May/June 2015. Patient has 2-3 severe headaches per day up to 2-3 hours that time. Patient has tried topiramate, lidocaine nasal spray, verapamil 80 mg 3 times a day without relief. He has seen another neurologist/headache specialist in the past.  Patient also having significant sleep problems, insomnia, frequent waking, as well as significant depression, poor energy, disinterest in activities.  REVIEW OF SYSTEMS: Full 14 system review of systems performed and notable only for weight loss fatigue swelling in legs ringing in ears trouble swallowing cough wheezing snoring blurred vision eye pain flashing joint pain joint swelling dizziness headache numbness weakness slurred speech trouble swallowing.  ALLERGIES: No Known Allergies  HOME MEDICATIONS: Outpatient Prescriptions Prior to Visit  Medication Sig Dispense Refill  . clonazePAM (KLONOPIN) 0.5 MG tablet Take 0.5 mg by mouth 2 (two) times daily as needed for anxiety.      . methocarbamol (ROBAXIN) 500 MG tablet Take 500 mg by mouth every 6 (six) hours as needed for muscle spasms.      . nortriptyline (PAMELOR) 10 MG capsule Take 10 mg by mouth 2 (two) times daily.       Marland Kitchen oxyCODONE-acetaminophen (PERCOCET) 7.5-325 MG per tablet Take 1 tablet by mouth  every 6 (six) hours as needed for pain.  30 tablet  0  . tadalafil (CIALIS) 5 MG tablet Take 1 tablet (5 mg total) by mouth daily.  30 tablet  6  . aspirin 81 MG chewable tablet Chew 1 tablet (81 mg total) by mouth daily.  30 tablet  0  . OxyCODONE (OXYCONTIN) 40 mg T12A 12 hr tablet Take 1 tablet (40 mg total) by mouth every 12 (twelve) hours.  60 tablet  0  . zolpidem (AMBIEN) 10 MG tablet Take 10 mg by mouth at bedtime as needed for sleep.       No facility-administered medications prior to visit.    PAST MEDICAL HISTORY: Past Medical History  Diagnosis Date  . Back pain   . Spider bite 2012  . COPD (chronic obstructive pulmonary disease)   . Dysphagia     patient reports d/t surgery, has modified diet, has to tilt head to swallow   . History of blood transfusion 2014    post op bleed  . Depression   . Coronary artery disease   . Stroke     11/2013  . Migraine     PAST SURGICAL HISTORY: Past Surgical History  Procedure Laterality Date  . Back surgery    . Lumbar fusion  2000  . Cholecystectomy  2009  . Anterior cervical decomp/discectomy fusion  12/22/2011    Procedure: ANTERIOR CERVICAL DECOMPRESSION/DISCECTOMY FUSION 3 LEVELS;  Surgeon: Melina Schools, MD;  Location: [place];  Service: Orthopedics;  Laterality: N/A;  ACDF C5-T1  . Anterior cervical decomp/discectomy fusion  06/25/2012    Procedure: ANTERIOR CERVICAL DECOMPRESSION/DISCECTOMY FUSION 1  LEVEL/HARDWARE REMOVAL;  Surgeon: [name], MD;  Location: [place];  Service: Orthopedics;  Laterality: N/A;  Removal anterior cervical plate C5-T1, Explore Fusion, Left C5-6, C6-7, C7-T1 Re-do Foraminotomy, Left open carpal tunnel release with release of ulnar nerve at [facility], Posterior Cervical Fusion with lateral mass screw  . Carpal tunnel release  06/25/2012    Procedure: CARPAL TUNNEL RELEASE;  Surgeon: [name], MD;  Location: [place];  Service: Orthopedics;  Laterality: Left;  as above  . Posterior cervical  fusion/foraminotomy  06/25/2012    Procedure: POSTERIOR CERVICAL FUSION/FORAMINOTOMY LEVEL 1;  Surgeon: [name], MD;  Location: [place];  Service: Orthopedics;  Laterality: N/A;  as above  . Cardiac catheterization    . Coronary angioplasty  2001    [facility]  . Posterior cervical fusion/foraminotomy N/A 12/17/2013    Procedure: REMOVAL OF POSTERIOR CERVICAL FUSION LATERAL MASS SCREWS AND RODS C5-T1, EXPLORE FUSION, LEFT SIX-SEVEN FORAMINOTOMY;  Surgeon: [name], MD;  Location: [place];  Service: Orthopedics;  Laterality: N/A;    FAMILY HISTORY: Family History  Problem Relation Age of Onset  . Anesthesia problems Neg Hx   . ALS Mother     SOCIAL HISTORY:  History   Social History  . Marital Status: Married    Spouse Name: Hassan Rowan    Number of Children: 2  . Years of Education: 10th   Occupational History  .     Social History Main Topics  . Smoking status: Former Smoker -- 1.00 packs/day for 30 years    Types: Cigarettes    Start date: 11/21/2012    Quit date: 12/11/2012  . Smokeless tobacco: Never Used  . Alcohol Use: No  . Drug Use: No  . Sexual Activity: Yes   Other Topics Concern  . Not on file   Social History Narrative   Patient lives at home with his spouse.   Caffeine Use: coffee     PHYSICAL EXAM  Filed Vitals:   05/09/14 1059  BP: 110/78  Pulse: 98  Height: 6\' 1"  (1.854 m)  Weight: 165 lb 3.2 oz (74.934 kg)    Not recorded    Body mass index is 21.8 kg/(m^2).  GENERAL EXAM: Patient is in no distress; well developed, nourished and groomed; neck is supple; SMELLS OF CIGARETTE SMOKE; FINGERNAIL CLUBBING  CARDIOVASCULAR: Regular rate and rhythm, no murmurs, no carotid bruits  NEUROLOGIC: MENTAL STATUS: awake, alert, oriented to person, place and time, recent and remote memory intact, normal attention and concentration, language fluent, comprehension intact, naming intact, fund of knowledge appropriate CRANIAL NERVE: no papilledema on  fundoscopic exam, pupils equal and reactive to light, visual fields full to confrontation, extraocular muscles intact, no nystagmus, facial sensation and strength symmetric, hearing intact, palate elevates symmetrically, uvula midline, shoulder shrug symmetric, tongue midline. MOTOR: normal bulk and tone; LIMITED IN LEFT DELTOID (4/5) AND RIGHT HIP FLEXOR (4/5) DUE TO PAIN. OTHERWISE 5/5. SENSORY: normal and symmetric to light touch, pinprick, temperature, vibration  COORDINATION: finger-nose-finger, fine finger movements normal REFLEXES: BUE 1, BLE TRACE GAIT/STATION: narrow based gait; able to walk on toes, heels and tandem; romberg is negative    DIAGNOSTIC DATA (LABS, IMAGING, TESTING) - I reviewed patient records, labs, notes, testing and imaging myself where available.  Lab Results  Component Value Date   WBC 8.3 12/30/2013   HGB 12.0* 12/30/2013   HCT 36.4* 12/30/2013   MCV 94.8 12/30/2013   PLT 367 12/30/2013  Component Value Date/Time   NA 143 12/30/2013 0551   K 3.9 12/30/2013 0551   CL 104 12/30/2013 0551   CO2 25 12/30/2013 0551   GLUCOSE 108* 12/30/2013 0551   BUN 7 12/30/2013 0551   CREATININE 0.77 12/30/2013 0551   CALCIUM 8.8 12/30/2013 0551   PROT 7.1 12/29/2013 0603   ALBUMIN 3.3* 12/29/2013 0603   AST 11 12/29/2013 0603   ALT 10 12/29/2013 0603   ALKPHOS 74 12/29/2013 0603   BILITOT 0.3 12/29/2013 0603   GFRNONAA >90 12/30/2013 0551   GFRAA >90 12/30/2013 0551   No results found for this basename: CHOL, HDL, LDLCALC, LDLDIRECT, TRIG, CHOLHDL   No results found for this basename: HGBA1C   No results found for this basename: VITAMINB12   No results found for this basename: TSH    I reviewed images myself and agree with interpretation. -VRP  04/21/14 CT LUMBAR MYELOGRAM 1. Unchanged appearance of the lumbar spine. Prior posterior decompression and fusion at L5-S1 without recurrent stenosis.  2. Mild bilateral neural foraminal stenosis at L3-4 due to mild disc bulging,  unchanged.  12/30/13 MRI BRAIN 1. No acute intracranial abnormality.  2. Multiple bilateral subcortical T2 hyperintensities are advanced for age. The finding is nonspecific but can be seen in the setting of chronic microvascular ischemia, a demyelinating process such as multiple sclerosis, vasculitis, complicated migraine headaches, or as the sequelae of a prior infectious or inflammatory process.  3. Scattered sinus disease as described.  12/12/13 CT CERVICAL  1. Fusion at C5-6, C6-7 and C7-T1.  2. Lateral mass screws at the same levels. The right C6 screw enters into the right C6-7 facet joint without nerve root compromise.  3. The lateral mass screws are otherwise contained within bone.  4. Mild central and right foraminal narrowing at C3-4 is stable.  5. Mild central and left foraminal stenosis at C4-5 is stable.  6. Mild right foraminal narrowing at C7-T1 due to right-sided uncovertebral spurring.     ASSESSMENT AND PLAN  53 y.o. year old male here with daily headaches since last cervical spine surgery in May 2015. 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 Patient has tried topiramate, verapamil, lidocaine nasal spray without relief. Patient is planning to see pain management specialist for neck pain and low back pain. I suspect with improvement in neck and low back pain, patient sleeping improved as well as depression problems, and this in turn may improve headache control. Regarding MRI brain findings, I think these are small vessel ischemic disease changes related to prior smoking, and incidental to patient's headaches.  Dx: cervicogenic headaches with migraine features  PLAN: - agree with pain management consultation  Return if symptoms worsen or fail to improve, for return to Dr. Louanne Skye.    Penni Bombard, MD 67/09/4191, 79:02 AM Certified in Neurology, Neurophysiology and Neuroimaging  [facility] [address] [place], [place] [address] [phone]

## 2014-05-09 NOTE — Patient Instructions (Signed)
Agree with pain management consultation.

## 2014-06-13 ENCOUNTER — Ambulatory Visit (HOSPITAL_BASED_OUTPATIENT_CLINIC_OR_DEPARTMENT_OTHER): Payer: 59 | Admitting: Physical Medicine & Rehabilitation

## 2014-06-13 ENCOUNTER — Encounter: Payer: 59 | Attending: Physical Medicine & Rehabilitation

## 2014-06-13 ENCOUNTER — Encounter: Payer: Self-pay | Admitting: Physical Medicine & Rehabilitation

## 2014-06-13 VITALS — BP 151/77 | HR 89 | Resp 14 | Ht 72.0 in | Wt 168.0 lb

## 2014-06-13 DIAGNOSIS — Z5181 Encounter for therapeutic drug level monitoring: Secondary | ICD-10-CM

## 2014-06-13 DIAGNOSIS — M961 Postlaminectomy syndrome, not elsewhere classified: Secondary | ICD-10-CM | POA: Insufficient documentation

## 2014-06-13 DIAGNOSIS — M542 Cervicalgia: Secondary | ICD-10-CM | POA: Diagnosis not present

## 2014-06-13 DIAGNOSIS — M47819 Spondylosis without myelopathy or radiculopathy, site unspecified: Secondary | ICD-10-CM

## 2014-06-13 DIAGNOSIS — G8928 Other chronic postprocedural pain: Secondary | ICD-10-CM | POA: Insufficient documentation

## 2014-06-13 DIAGNOSIS — M436 Torticollis: Secondary | ICD-10-CM | POA: Diagnosis not present

## 2014-06-13 DIAGNOSIS — Y838 Other surgical procedures as the cause of abnormal reaction of the patient, or of later complication, without mention of misadventure at the time of the procedure: Secondary | ICD-10-CM | POA: Insufficient documentation

## 2014-06-13 DIAGNOSIS — G249 Dystonia, unspecified: Secondary | ICD-10-CM | POA: Diagnosis not present

## 2014-06-13 DIAGNOSIS — T849XXS Unspecified complication of internal orthopedic prosthetic device, implant and graft, sequela: Secondary | ICD-10-CM

## 2014-06-13 DIAGNOSIS — G243 Spasmodic torticollis: Secondary | ICD-10-CM

## 2014-06-13 DIAGNOSIS — Z79899 Other long term (current) drug therapy: Secondary | ICD-10-CM

## 2014-06-13 DIAGNOSIS — M4722 Other spondylosis with radiculopathy, cervical region: Secondary | ICD-10-CM

## 2014-06-13 NOTE — Progress Notes (Signed)
Subjective:    Patient ID: Bradley Rice, male    DOB: Apr 07, 1961, 53 y.o.   MRN: [id number]  HPI 53 year old male who tripped and fell down steps 12/22/2011.Taken to the emergency department at [facility] but then transferred to [facility] for C5-T1 ACDF by Dr. Rolena Infante. Continued to have postoperative pain and discomfort. Patient also feels like he has been weak on the left side since that time.  Normal appearance of fusion on postoperative CT scans 2 Had second opinion Dr. Louanne Skye who performed revision surgery for ACDF as well as posterior spinal fusion 06/25/2012. Had continued neck discomfort, CT myelogram 12/12/2013 Showed intact fusion no other levels above fusion were involved, Removal of hardware posteriorCervical fusion 12/17/2013. No significant improvement in neck pain since that time.     No postoperative therapy. Has been on oxycodone 7.5 mg 4 times a day Since most recent surgery. Was on OxyContin after the initial surgery but made him feel funny and he took himself off of these.   Pain Inventory Average Pain 9 Pain Right Now 9 My pain is sharp and stabbing  In the last 24 hours, has pain interfered with the following? General activity 8 Relation with others 9 Enjoyment of life 8 What TIME of day is your pain at its worst? morning and daytime Sleep (in general) Poor  Pain is worse with: walking, bending, sitting, standing and some activites Pain improves with: medication Relief from Meds: 3  Mobility use a cane how many minutes can you walk? 5 ability to climb steps?  no do you drive?  yes  Function disabled: date disabled 09/22/11  Neuro/Psych weakness numbness tingling trouble walking spasms confusion loss of taste or smell  Prior Studies Any changes since last visit?  no  Physicians involved in your care Orthopedist [place] and [place]   Family History  Problem Relation Age of Onset  . Anesthesia problems Neg Hx   . ALS Mother      History   Social History  . Marital Status: Married    Spouse Name: Hassan Rowan    Number of Children: 2  . Years of Education: 10th   Occupational History  .     Social History Main Topics  . Smoking status: Former Smoker -- 1.00 packs/day for 30 years    Types: Cigarettes    Start date: 11/21/2012    Quit date: 12/11/2012  . Smokeless tobacco: Never Used  . Alcohol Use: No  . Drug Use: No  . Sexual Activity: Yes   Other Topics Concern  . None   Social History Narrative   Patient lives at home with his spouse.   Caffeine Use: coffee   Past Surgical History  Procedure Laterality Date  . Back surgery    . Lumbar fusion  2000  . Cholecystectomy  2009  . Anterior cervical decomp/discectomy fusion  12/22/2011    Procedure: ANTERIOR CERVICAL DECOMPRESSION/DISCECTOMY FUSION 3 LEVELS;  Surgeon: Melina Schools, MD;  Location: [place];  Service: Orthopedics;  Laterality: N/A;  ACDF C5-T1  . Anterior cervical decomp/discectomy fusion  06/25/2012    Procedure: ANTERIOR CERVICAL DECOMPRESSION/DISCECTOMY FUSION 1 LEVEL/HARDWARE REMOVAL;  Surgeon: [name], MD;  Location: [place];  Service: Orthopedics;  Laterality: N/A;  Removal anterior cervical plate C5-T1, Explore Fusion, Left C5-6, C6-7, C7-T1 Re-do Foraminotomy, Left open carpal tunnel release with release of ulnar nerve at [facility], Posterior Cervical Fusion with lateral mass screw  . Carpal tunnel release  06/25/2012  Procedure: CARPAL TUNNEL RELEASE;  Surgeon: [name], MD;  Location: [place];  Service: Orthopedics;  Laterality: Left;  as above  . Posterior cervical fusion/foraminotomy  06/25/2012    Procedure: POSTERIOR CERVICAL FUSION/FORAMINOTOMY LEVEL 1;  Surgeon: [name], MD;  Location: [place];  Service: Orthopedics;  Laterality: N/A;  as above  . Cardiac catheterization    . Coronary angioplasty  2001    [facility]  . Posterior cervical fusion/foraminotomy N/A 12/17/2013    Procedure: REMOVAL OF POSTERIOR  CERVICAL FUSION LATERAL MASS SCREWS AND RODS C5-T1, EXPLORE FUSION, LEFT SIX-SEVEN FORAMINOTOMY;  Surgeon: [name], MD;  Location: [place];  Service: Orthopedics;  Laterality: N/A;   Past Medical History  Diagnosis Date  . Back pain   . Spider bite 2012  . COPD (chronic obstructive pulmonary disease)   . Dysphagia     patient reports d/t surgery, has modified diet, has to tilt head to swallow   . History of blood transfusion 2014    post op bleed  . Depression   . Coronary artery disease   . Stroke     11/2013  . Migraine    BP 151/77 mmHg  Pulse 89  Resp 14  Ht 6' (1.829 m)  Wt 168 lb (76.204 kg)  BMI 22.78 kg/m2  SpO2 99%  Opioid Risk Score: 0 Fall Risk Score: Moderate Fall Risk (6-13 points) (educated and given handout)  Review of Systems  Respiratory: Positive for cough and shortness of breath.   Musculoskeletal:       Spasms  Neurological: Positive for weakness, numbness and headaches.       Tingling  Hematological: Bruises/bleeds easily.  Psychiatric/Behavioral: Positive for confusion.  All other systems reviewed and are negative.      Objective:   Physical Exam  Constitutional: He is oriented to person, place, and time.  Neurological: He is alert and oriented to person, place, and time. Gait abnormal.  Reflex Scores:      Tricep reflexes are 2+ on the right side and 2+ on the left side.      Bicep reflexes are 2+ on the right side and 2+ on the left side.      Brachioradialis reflexes are 2+ on the right side and 2+ on the left side.      Patellar reflexes are 2+ on the right side and 2+ on the left side.      Achilles reflexes are 2+ on the right side and 2+ on the left side. 4/5 strength in the left deltoid, biceps, triceps, grip 5/5 in the right deltoid, biceps, triceps, grip 5/5 bilateral hip flexor and extensor ankle dorsal flexor Ambulates with a flexed knee on the left side. Small step length, shuffling gait     Left hand decreased sensation  in all 5 digitsTo light touch and pinprick  Slight "torticollis to the Right side Tender areas Right levator, left Left trapezius, left occipital,  Mild shoulder elevation on the left side Left. Mastoid Bilateral scalenes  TWISTRS score 33    Assessment & Plan:  1. Cervical postlaminectomy syndrome with chronic postoperative pain. No significant radiculopathy. Has had modest relief with narcotic analgesics. Opioid risk is 0  We'll check urine drug screen And if appropriate will Complete controlled substance agreement and opioid consent Would consider changing oxycodone 7.5 4 times a day to a long acting morphine sulfate 15 twice a day  2. Cervical dystonia, tenderness and mild torticollis. Cervical fusion is limiting  range of motion and likely masking some physical signs. Would recommend trial of DYsport 500 units 50 units into each of the following muscles Right levator Left Levator Left trapezius Mid Left trapezius upper Left semispinalis capitis Left sternocleidomastoid Left scalenes Median Left scalenes posterior Right scalenes Middle Right scalenes posterior

## 2014-06-13 NOTE — Patient Instructions (Signed)
Next appointment with me is for neck injections using botulinum toxin Please see the literature I have provided

## 2014-06-16 ENCOUNTER — Other Ambulatory Visit: Payer: Self-pay | Admitting: Physical Medicine & Rehabilitation

## 2014-06-17 LAB — PMP ALCOHOL METABOLITE (ETG): ETGU: NEGATIVE ng/mL

## 2014-06-19 LAB — OXYCODONE, URINE (LC/MS-MS)
NOROXYCODONE, UR: 1815 ng/mL (ref <50)
OXYCODONE, UR: 1550 ng/mL (ref <50)
OXYMORPHONE, URINE: 1241 ng/mL (ref <50)

## 2014-06-19 LAB — OPIATES/OPIOIDS (LC/MS-MS)
CODEINE URINE: NEGATIVE ng/mL (ref <50)
HYDROCODONE: NEGATIVE ng/mL (ref <50)
HYDROMORPHONE: NEGATIVE ng/mL (ref <50)
MORPHINE: NEGATIVE ng/mL (ref <50)
NORHYDROCODONE, UR: NEGATIVE ng/mL (ref <50)
Noroxycodone, Ur: 1815 ng/mL (ref <50)
Oxycodone, ur: 1550 ng/mL (ref <50)
Oxymorphone: 1241 ng/mL (ref <50)

## 2014-06-20 LAB — PRESCRIPTION MONITORING PROFILE (SOLSTAS)
Amphetamine/Meth: NEGATIVE ng/mL
BARBITURATE SCREEN, URINE: NEGATIVE ng/mL
BENZODIAZEPINE SCREEN, URINE: NEGATIVE ng/mL
BUPRENORPHINE, URINE: NEGATIVE ng/mL
Cannabinoid Scrn, Ur: NEGATIVE ng/mL
Carisoprodol, Urine: NEGATIVE ng/mL
Cocaine Metabolites: NEGATIVE ng/mL
Creatinine, Urine: 43.05 mg/dL (ref >20.0)
FENTANYL URINE: NEGATIVE ng/mL
MDMA URINE: NEGATIVE ng/mL
Meperidine, Ur: NEGATIVE ng/mL
Methadone Screen, Urine: NEGATIVE ng/mL
NITRITES URINE, INITIAL: NEGATIVE ug/mL
Propoxyphene: NEGATIVE ng/mL
Tapentadol, urine: NEGATIVE ng/mL
Tramadol Scrn, Ur: NEGATIVE ng/mL
ZOLPIDEM, URINE: NEGATIVE ng/mL
pH, Initial: 6.4 pH (ref 4.5–8.9)

## 2014-07-15 ENCOUNTER — Encounter: Payer: 59 | Attending: Physical Medicine & Rehabilitation

## 2014-07-15 ENCOUNTER — Encounter: Payer: Self-pay | Admitting: Physical Medicine & Rehabilitation

## 2014-07-15 ENCOUNTER — Ambulatory Visit (HOSPITAL_BASED_OUTPATIENT_CLINIC_OR_DEPARTMENT_OTHER): Payer: 59 | Admitting: Physical Medicine & Rehabilitation

## 2014-07-15 VITALS — BP 123/71 | HR 96 | Resp 14 | Wt 170.4 lb

## 2014-07-15 DIAGNOSIS — M542 Cervicalgia: Secondary | ICD-10-CM | POA: Insufficient documentation

## 2014-07-15 DIAGNOSIS — G8928 Other chronic postprocedural pain: Secondary | ICD-10-CM | POA: Insufficient documentation

## 2014-07-15 DIAGNOSIS — G249 Dystonia, unspecified: Secondary | ICD-10-CM | POA: Insufficient documentation

## 2014-07-15 DIAGNOSIS — M436 Torticollis: Secondary | ICD-10-CM | POA: Insufficient documentation

## 2014-07-15 DIAGNOSIS — M961 Postlaminectomy syndrome, not elsewhere classified: Secondary | ICD-10-CM | POA: Insufficient documentation

## 2014-07-15 DIAGNOSIS — G243 Spasmodic torticollis: Secondary | ICD-10-CM

## 2014-07-15 MED ORDER — MORPHINE SULFATE ER 15 MG PO TBCR: 15.0000 mg | EXTENDED_RELEASE_TABLET | Freq: Two times a day (BID) | ORAL | Status: DC

## 2014-07-15 NOTE — Progress Notes (Signed)
Dysport Injection for spasticity using needle EMG guidance  Dilution: 500 Units/ml Indication: Severe spasticity which interferes with ADL,mobility and/or  hygiene and is unresponsive to medication management and other conservative care Informed consent was obtained after describing risks and benefits of the procedure with the patient. This includes bleeding, bruising, infection, excessive weakness, or medication side effects. A REMS form is on file and signed. Needle: 27g 1" needle electrode Number of units per muscle 50 units into each of the following muscles Right levator Left Levator Left trapezius Mid Left trapezius upper Left semispinalis capitis Left sternocleidomastoid x2 Left scalenes Median Left scalenes posterior Right scalenes Middle Right scalenes posterior  All injections were done after obtaining appropriate EMG activity and after negative drawback for blood. The patient tolerated the procedure well. Post procedure instructions were given. A followup appointment was made.

## 2014-07-15 NOTE — Patient Instructions (Signed)
You received abobotulinum toxin injection today. You may experience soreness at the needle injection sites. Please call us if any of the injection sites turns red after a couple days or if there is any drainage. You may experience muscle weakness as a result of this medication.You may experience trouble swallowing This would improve with time but can take several weeks to improve. The Botox should start working in about one week. The Botox usually last 3 months. The injection can be repeated every 3 months as needed.

## 2014-08-04 ENCOUNTER — Telehealth: Payer: Self-pay | Admitting: *Deleted

## 2014-08-04 NOTE — Telephone Encounter (Signed)
Bradley Rice called from [facility] [phone] needs clarification on RX - sig and quantity on medication that was recently prescribed. She did not leave name of medication.

## 2014-08-15 ENCOUNTER — Encounter: Payer: 59 | Attending: Physical Medicine & Rehabilitation

## 2014-08-15 ENCOUNTER — Ambulatory Visit (HOSPITAL_BASED_OUTPATIENT_CLINIC_OR_DEPARTMENT_OTHER): Payer: 59 | Admitting: Physical Medicine & Rehabilitation

## 2014-08-15 ENCOUNTER — Encounter: Payer: Self-pay | Admitting: Physical Medicine & Rehabilitation

## 2014-08-15 VITALS — BP 128/72 | HR 86 | Resp 14

## 2014-08-15 DIAGNOSIS — M436 Torticollis: Secondary | ICD-10-CM | POA: Insufficient documentation

## 2014-08-15 DIAGNOSIS — G249 Dystonia, unspecified: Secondary | ICD-10-CM | POA: Diagnosis not present

## 2014-08-15 DIAGNOSIS — G8928 Other chronic postprocedural pain: Secondary | ICD-10-CM | POA: Insufficient documentation

## 2014-08-15 DIAGNOSIS — M542 Cervicalgia: Secondary | ICD-10-CM | POA: Insufficient documentation

## 2014-08-15 DIAGNOSIS — M961 Postlaminectomy syndrome, not elsewhere classified: Secondary | ICD-10-CM | POA: Insufficient documentation

## 2014-08-15 MED ORDER — MORPHINE SULFATE ER 15 MG PO TBCR: 15.0000 mg | EXTENDED_RELEASE_TABLET | Freq: Three times a day (TID) | ORAL | Status: DC | PRN

## 2014-08-15 NOTE — Progress Notes (Signed)
Subjective:    Patient ID: Bradley Rice, male    DOB: June 17, 1961, 54 y.o.   MRN: [id number]  HPI 54 year old male with history of failed cervical fusion has had both anterior fusion, revision anterior fusion as well as posterior fusion. Chronic pain for the last 2+ years. Head initial rehabilitation evaluation prostate 2 month ago.Urine drug screen was appropriate. Opioid risk is 0 Head symptoms of cervical dystonia.Twister score of 33 Botulinum toxin injection performed on12/15/2015 50 units into each of the following muscles Right levator Left Levator Left trapezius Mid Left trapezius upper Left semispinalis capitis Left sternocleidomastoid x2 Left scalenes Median Left scalenes posterior Right scalenes Middle Right scalenes posterior  No change in range of motion, no change in pain.  Pain Inventory Average Pain 7   Pain Right Now 7 My pain is constant, sharp, stabbing and aching  In the last 24 hours, has pain interfered with the following? General activity 3 Relation with others 8 Enjoyment of life 7 What TIME of day is your pain at its worst? evening Sleep (in general) Fair  Pain is worse with: walking, bending, standing and some activites Pain improves with: rest, heat/ice, medication and injections Relief from Meds: 4  Mobility walk without assistance how many minutes can you walk? 15 ability to climb steps?  no do you drive?  yes  Function disabled: date disabled .  Neuro/Psych weakness numbness tremor trouble walking spasms  Prior Studies Any changes since last visit?  no  Physicians involved in your care Any changes since last visit?  no   Family History  Problem Relation Age of Onset  . Anesthesia problems Neg Hx   . ALS Mother    History   Social History  . Marital Status: Married    Spouse Name: Hassan Rowan    Number of Children: 2  . Years of Education: 10th   Occupational History  .     Social History Main Topics  . Smoking  status: Former Smoker -- 1.00 packs/day for 30 years    Types: Cigarettes    Start date: 11/21/2012    Quit date: 12/11/2012  . Smokeless tobacco: Never Used  . Alcohol Use: No  . Drug Use: No  . Sexual Activity: Yes   Other Topics Concern  . None   Social History Narrative   Patient lives at home with his spouse.   Caffeine Use: coffee   Past Surgical History  Procedure Laterality Date  . Back surgery    . Lumbar fusion  2000  . Cholecystectomy  2009  . Anterior cervical decomp/discectomy fusion  12/22/2011    Procedure: ANTERIOR CERVICAL DECOMPRESSION/DISCECTOMY FUSION 3 LEVELS;  Surgeon: Melina Schools, MD;  Location: [place];  Service: Orthopedics;  Laterality: N/A;  ACDF C5-T1  . Anterior cervical decomp/discectomy fusion  06/25/2012    Procedure: ANTERIOR CERVICAL DECOMPRESSION/DISCECTOMY FUSION 1 LEVEL/HARDWARE REMOVAL;  Surgeon: [name], MD;  Location: [place];  Service: Orthopedics;  Laterality: N/A;  Removal anterior cervical plate C5-T1, Explore Fusion, Left C5-6, C6-7, C7-T1 Re-do Foraminotomy, Left open carpal tunnel release with release of ulnar nerve at [facility], Posterior Cervical Fusion with lateral mass screw  . Carpal tunnel release  06/25/2012    Procedure: CARPAL TUNNEL RELEASE;  Surgeon: [name], MD;  Location: [place];  Service: Orthopedics;  Laterality: Left;  as above  . Posterior cervical fusion/foraminotomy  06/25/2012    Procedure: POSTERIOR CERVICAL FUSION/FORAMINOTOMY LEVEL 1;  Surgeon: [name], MD;  Location: [place];  Service: Orthopedics;  Laterality: N/A;  as above  . Cardiac catheterization    . Coronary angioplasty  2001    [facility]  . Posterior cervical fusion/foraminotomy N/A 12/17/2013    Procedure: REMOVAL OF POSTERIOR CERVICAL FUSION LATERAL MASS SCREWS AND RODS C5-T1, EXPLORE FUSION, LEFT SIX-SEVEN FORAMINOTOMY;  Surgeon: [name], MD;  Location: [place];  Service: Orthopedics;  Laterality: N/A;   Past Medical History    Diagnosis Date  . Back pain   . Spider bite 2012  . COPD (chronic obstructive pulmonary disease)   . Dysphagia     patient reports d/t surgery, has modified diet, has to tilt head to swallow   . History of blood transfusion 2014    post op bleed  . Depression   . Coronary artery disease   . Stroke     11/2013  . Migraine    BP 128/72 mmHg  Pulse 86  Resp 14  SpO2 98%  Opioid Risk Score:   Fall Risk Score: Low Fall Risk (0-5 points)  Review of Systems  HENT: Negative.   Eyes: Negative.   Respiratory: Negative.   Cardiovascular: Negative.   Gastrointestinal: Negative.   Endocrine: Negative.   Genitourinary: Negative.   Musculoskeletal: Positive for myalgias, arthralgias, gait problem and neck pain.  Skin: Negative.   Allergic/Immunologic: Negative.   Neurological: Positive for weakness and numbness.       Trouble walking, tremor  Hematological: Negative.   Psychiatric/Behavioral: Negative.        Objective:   Physical Exam  Motor strength is 4/5 in right deltoid, bicep, tricep and grip 5/5 in the left deltoid, biceps, triceps, grip 5/5 bilateral hip flexor, knee extensor, ankle dorsi flexors and plantar flexors Straight leg raising test is normal Cervical range of motion is reduced E has less than 50% range of motion Road rotation for the right, 75% to the left. 50% flexion and extension      Assessment & Plan:  1. 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 He's had anterior cervical decompression and fusion 2 as well as posterior spinal fusion. He gets temporary relief with MS Contin. Will increase to every 8 hours. Recheck in 1 month. Nurse practitioner visit. If patient still gets in adequate relief would consider dosage increased to 30 mg twice a day of MS Contin

## 2014-08-15 NOTE — Patient Instructions (Signed)
If this dose of morphine is not adequate, we'll need to increase it next month.

## 2014-08-29 ENCOUNTER — Other Ambulatory Visit: Payer: Self-pay | Admitting: Family

## 2014-09-12 ENCOUNTER — Encounter: Payer: 59 | Attending: Physical Medicine & Rehabilitation | Admitting: Registered Nurse

## 2014-09-12 ENCOUNTER — Encounter: Payer: Self-pay | Admitting: Registered Nurse

## 2014-09-12 VITALS — BP 140/82 | HR 88 | Resp 14

## 2014-09-12 DIAGNOSIS — M5442 Lumbago with sciatica, left side: Secondary | ICD-10-CM

## 2014-09-12 DIAGNOSIS — G8928 Other chronic postprocedural pain: Secondary | ICD-10-CM | POA: Insufficient documentation

## 2014-09-12 DIAGNOSIS — M961 Postlaminectomy syndrome, not elsewhere classified: Secondary | ICD-10-CM | POA: Diagnosis not present

## 2014-09-12 DIAGNOSIS — G249 Dystonia, unspecified: Secondary | ICD-10-CM | POA: Insufficient documentation

## 2014-09-12 DIAGNOSIS — Z79899 Other long term (current) drug therapy: Secondary | ICD-10-CM

## 2014-09-12 DIAGNOSIS — M4722 Other spondylosis with radiculopathy, cervical region: Secondary | ICD-10-CM

## 2014-09-12 DIAGNOSIS — M5441 Lumbago with sciatica, right side: Secondary | ICD-10-CM

## 2014-09-12 DIAGNOSIS — M436 Torticollis: Secondary | ICD-10-CM | POA: Insufficient documentation

## 2014-09-12 DIAGNOSIS — M542 Cervicalgia: Secondary | ICD-10-CM | POA: Insufficient documentation

## 2014-09-12 DIAGNOSIS — G243 Spasmodic torticollis: Secondary | ICD-10-CM

## 2014-09-12 DIAGNOSIS — Z5181 Encounter for therapeutic drug level monitoring: Secondary | ICD-10-CM

## 2014-09-12 MED ORDER — MORPHINE SULFATE ER 30 MG PO TBCR: 30.0000 mg | EXTENDED_RELEASE_TABLET | Freq: Two times a day (BID) | ORAL | Status: DC

## 2014-09-12 NOTE — Progress Notes (Signed)
Subjective:    Patient ID: Bradley Rice, male    DOB: 1960/11/14, 54 y.o.   MRN: [id number]  HPI: Mr. Bradley Rice is a 54 year old male who returns for follow up for mobility assessment, chronic pain and medication refill. He says his pain is located in his neck and lower back. He rates his pain 9.He's complaining of intensity of pain  The weather is having a great impact on him as well. He's only able to walk short distances in his home. He states the pain has impacted his life and mobility. We discussed motorized wheelchair to assist with his activity in the home. He verbalizes understanding. We will send him for PT evaluation for motorized wheelchair he verbalizes understanding. Spoke with Andria Rhein ( [facility]) documents will be faxed to [facility].  Past Surgical History: 12/22/11: Anterior Cervical Decompression Discectomy Fusion  06/25/2012: Failed Cervical Fusion: Anterior Cervical Decompression/ Discectomy Fusion 1 Level Hardware removal. 12/17/2013 Removal of Posterior Cervical Fusion Lateral Mass Screws and Rods C-5- T-1 Explore Fusion Left 6-7 Foraminotomy.  Had dental work performed and received Percocet, educated on the narcotic contract. All Narcotics have to be approved from this office he verbalizes understanding.  Pain Inventory Average Pain 9 Pain Right Now 9 My pain is constant, sharp and aching  In the last 24 hours, has pain interfered with the following? General activity 7 Relation with others 9 Enjoyment of life 9 What TIME of day is your pain at its worst? evening Sleep (in general) Fair  Pain is worse with: walking, bending, standing and some activites Pain improves with: pacing activities and medication Relief from Meds: 3  Mobility use a cane how many minutes can you walk? 5-10 ability to climb steps?  no do you drive?  yes  Function disabled: date disabled .  Neuro/Psych weakness trouble walking anxiety  Prior Studies Any changes  since last visit?  no  Physicians involved in your care Any changes since last visit?  no   Family History  Problem Relation Age of Onset  . Anesthesia problems Neg Hx   . ALS Mother    History   Social History  . Marital Status: Married    Spouse Name: Hassan Rowan  . Number of Children: 2  . Years of Education: 10th   Occupational History  .     Social History Main Topics  . Smoking status: Former Smoker -- 1.00 packs/day for 30 years    Types: Cigarettes    Start date: 11/21/2012    Quit date: 12/11/2012  . Smokeless tobacco: Never Used  . Alcohol Use: No  . Drug Use: No  . Sexual Activity: Yes   Other Topics Concern  . None   Social History Narrative   Patient lives at home with his spouse.   Caffeine Use: coffee   Past Surgical History  Procedure Laterality Date  . Back surgery    . Lumbar fusion  2000  . Cholecystectomy  2009  . Anterior cervical decomp/discectomy fusion  12/22/2011    Procedure: ANTERIOR CERVICAL DECOMPRESSION/DISCECTOMY FUSION 3 LEVELS;  Surgeon: Melina Schools, MD;  Location: [place];  Service: Orthopedics;  Laterality: N/A;  ACDF C5-T1  . Anterior cervical decomp/discectomy fusion  06/25/2012    Procedure: ANTERIOR CERVICAL DECOMPRESSION/DISCECTOMY FUSION 1 LEVEL/HARDWARE REMOVAL;  Surgeon: [name], MD;  Location: [place];  Service: Orthopedics;  Laterality: N/A;  Removal anterior cervical plate C5-T1, Explore Fusion, Left C5-6, C6-7, C7-T1 Re-do Foraminotomy, Left  open carpal tunnel release with release of ulnar nerve at [facility], Posterior Cervical Fusion with lateral mass screw  . Carpal tunnel release  06/25/2012    Procedure: CARPAL TUNNEL RELEASE;  Surgeon: [name], MD;  Location: [place];  Service: Orthopedics;  Laterality: Left;  as above  . Posterior cervical fusion/foraminotomy  06/25/2012    Procedure: POSTERIOR CERVICAL FUSION/FORAMINOTOMY LEVEL 1;  Surgeon: [name], MD;  Location: [place];  Service: Orthopedics;   Laterality: N/A;  as above  . Cardiac catheterization    . Coronary angioplasty  2001    [facility]  . Posterior cervical fusion/foraminotomy N/A 12/17/2013    Procedure: REMOVAL OF POSTERIOR CERVICAL FUSION LATERAL MASS SCREWS AND RODS C5-T1, EXPLORE FUSION, LEFT SIX-SEVEN FORAMINOTOMY;  Surgeon: [name], MD;  Location: [place];  Service: Orthopedics;  Laterality: N/A;   Past Medical History  Diagnosis Date  . Back pain   . Spider bite 2012  . COPD (chronic obstructive pulmonary disease)   . Dysphagia     patient reports d/t surgery, has modified diet, has to tilt head to swallow   . History of blood transfusion 2014    post op bleed  . Depression   . Coronary artery disease   . Stroke     11/2013  . Migraine    BP 140/82 mmHg  Pulse 88  Resp 14  SpO2 94%  Opioid Risk Score:   Fall Risk Score: Moderate Fall Risk (6-13 points)  Review of Systems  HENT: Negative.   Eyes: Negative.   Respiratory: Positive for cough.   Cardiovascular: Negative.   Gastrointestinal: Negative.   Endocrine: Negative.   Genitourinary: Negative.   Musculoskeletal: Positive for myalgias, back pain, arthralgias, gait problem, neck pain and neck stiffness.  Skin: Negative.   Allergic/Immunologic: Negative.   Neurological: Positive for weakness.       Trouble walking  Hematological: Negative.   Psychiatric/Behavioral: The patient is nervous/anxious.        Objective:   Physical Exam  Constitutional: He is oriented to person, place, and time. He appears well-developed and well-nourished.  HENT:  Head: Normocephalic and atraumatic.  Neck: Normal range of motion. Neck supple.  Cervical Paraspinal Tenderness: C-1- C-6  Cardiovascular: Normal rate and regular rhythm.   Pulmonary/Chest: Effort normal and breath sounds normal.  Musculoskeletal:  Thin Stature: Muscle Testing Reveals: Upper Extremities: Decreased ROM and Muscle Strength 4/5 on the left. Right 5/5/ Thoracic and Lumbar  Hypersensitivity  ( With Palpation Facial Grimacing Noted) Lower Extremities: Flexion Produces pain into Lumbar Arises from chair with difficulty/ Using straight cane for support. Antalgic Gait  Neurological: He is alert and oriented to person, place, and time.  Skin: Skin is warm and dry.  Psychiatric: He has a normal mood and affect.  Nursing note and vitals reviewed.         Assessment & Plan:  1. 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 He's had anterior cervical decompression and fusion 2 as well as posterior spinal fusion. RX: Increased to  MS Contin 30 mg one tablet every 12 #60.   40 minutes of face to face patient care time was spent during this visit. All questions were encouraged and answered.  F/U in 1 month

## 2014-09-16 ENCOUNTER — Telehealth: Payer: Self-pay | Admitting: *Deleted

## 2014-09-16 MED ORDER — OXYCODONE HCL ER 20 MG PO T12A: 20.0000 mg | EXTENDED_RELEASE_TABLET | Freq: Two times a day (BID) | ORAL | Status: DC

## 2014-09-16 NOTE — Telephone Encounter (Signed)
Spoke with Bradley Rice, He is still having vomiting after MS Contin. He has taken medication with a full meal and he spoke to his pharmacist has tried taking milk first. Still experiencing vomiting. We will change his medication to Oxycontin 20 mg BID, spoke with Dr. Letta Pate.  Bradley Rice understanding.

## 2014-09-16 NOTE — Telephone Encounter (Signed)
Pt says he is having a bad reaction to morphine 30 mg, causing emesis with or without food, was told that if he had problems like these he could bring the meds back and exchange for a different script

## 2014-09-24 ENCOUNTER — Telehealth: Payer: Self-pay | Admitting: *Deleted

## 2014-09-24 NOTE — Telephone Encounter (Signed)
Patients insurance denied  OxyContin 20 mg ER Tablets q12h.  I called the insurance company and spoke to a [place] and she informed me that on this plan the Opana ER is covered on tier 2.  Can we RX for patient instead of OxyContin? And if yes which dose?  Please advise

## 2014-09-25 ENCOUNTER — Other Ambulatory Visit: Payer: Self-pay | Admitting: *Deleted

## 2014-09-25 MED ORDER — OXYMORPHONE HCL ER 10 MG PO TB12: 10.0000 mg | ORAL_TABLET | Freq: Two times a day (BID) | ORAL | Status: DC

## 2014-09-25 NOTE — Telephone Encounter (Signed)
Printed RX for Opana 10 #60 as per Dr. Letta Pate. Will call patient.

## 2014-09-25 NOTE — Telephone Encounter (Signed)
Opana ER 10mg  BID #60  In place of morphine, oxycontin

## 2014-09-26 NOTE — Telephone Encounter (Signed)
Called patient this morning to explain that he must bring back the old RX back to be destroyed and we are giving him Opana 10 mg #60. Left message

## 2014-09-26 NOTE — Telephone Encounter (Signed)
Pt came into the office 09/26/2014 and picked up his script for Opana at the front desk. I asked him if he had his paper script for oxycontin and he informed that it is on file at [facility] in [place]. I phoned the pharmacy to confirm and then asked for them to destroy the script.

## 2014-10-10 ENCOUNTER — Other Ambulatory Visit: Payer: Self-pay | Admitting: Physical Medicine & Rehabilitation

## 2014-10-10 ENCOUNTER — Encounter: Payer: 59 | Attending: Physical Medicine & Rehabilitation | Admitting: Registered Nurse

## 2014-10-10 ENCOUNTER — Encounter: Payer: Self-pay | Admitting: Registered Nurse

## 2014-10-10 VITALS — BP 136/77 | HR 97 | Resp 14

## 2014-10-10 DIAGNOSIS — G894 Chronic pain syndrome: Secondary | ICD-10-CM

## 2014-10-10 DIAGNOSIS — M436 Torticollis: Secondary | ICD-10-CM | POA: Insufficient documentation

## 2014-10-10 DIAGNOSIS — M961 Postlaminectomy syndrome, not elsewhere classified: Secondary | ICD-10-CM | POA: Diagnosis not present

## 2014-10-10 DIAGNOSIS — G8928 Other chronic postprocedural pain: Secondary | ICD-10-CM | POA: Diagnosis present

## 2014-10-10 DIAGNOSIS — M5442 Lumbago with sciatica, left side: Secondary | ICD-10-CM

## 2014-10-10 DIAGNOSIS — M4722 Other spondylosis with radiculopathy, cervical region: Secondary | ICD-10-CM

## 2014-10-10 DIAGNOSIS — G243 Spasmodic torticollis: Secondary | ICD-10-CM

## 2014-10-10 DIAGNOSIS — G249 Dystonia, unspecified: Secondary | ICD-10-CM | POA: Diagnosis not present

## 2014-10-10 DIAGNOSIS — Z79899 Other long term (current) drug therapy: Secondary | ICD-10-CM

## 2014-10-10 DIAGNOSIS — M542 Cervicalgia: Secondary | ICD-10-CM | POA: Diagnosis not present

## 2014-10-10 DIAGNOSIS — Z5181 Encounter for therapeutic drug level monitoring: Secondary | ICD-10-CM

## 2014-10-10 DIAGNOSIS — M5441 Lumbago with sciatica, right side: Secondary | ICD-10-CM

## 2014-10-10 MED ORDER — OXYMORPHONE HCL ER 20 MG PO TB12: 20.0000 mg | ORAL_TABLET | Freq: Two times a day (BID) | ORAL | Status: DC

## 2014-10-10 NOTE — Progress Notes (Signed)
Subjective:    Patient ID: Bradley Rice, male    DOB: 09/27/1960, 54 y.o.   MRN: [id number]  HPI: Mr. Bradley Rice is a 54 year old male who returns for follow up for chronic pain and medication refill. He says his pain is located in his neck and lower back. He's having increase intensity of pain in his mid-lower back facial grimacing noted. Changing positions frequently during assessment with facial grimacing noted with position changes. He rates his pain 8. His current exercise regime is walking short distances with straight cane. He's wearing his TENS Unit daily. He was ordered MS Contin 30 mg every 12 hours adverse effects. Changes to Oxycontin 20 mg every 12 hours insurance wouldn't cover the oxycontin and was changed to Opana. He was placed on Opana 10 mg one tablet every 12 hours ineffective. Spoke with Dr. Naaman Plummer covering for Dr. Letta Pate we will increase Opana to 20 mg one tablet every 12 hours. Will schedule next visit with Dr. Letta Pate for further recommendations with analgesics. Bradley Rice states around [phone] he had a spinal fusion, according to MRI L-5- S1- Fusion.  Pain Inventory Average Pain 8 Pain Right Now 8 My pain is sharp, burning, stabbing and aching  In the last 24 hours, has pain interfered with the following? General activity 8 Relation with others 7 Enjoyment of life 5 What TIME of day is your pain at its worst? all Sleep (in general) Fair  Pain is worse with: walking, bending, inactivity, standing and some activites Pain improves with: medication and TENS Relief from Meds: 7  Mobility walk with assistance use a cane how many minutes can you walk? 5 ability to climb steps?  no do you drive?  yes transfers alone Do you have any goals in this area?  no  Function not employed: date last employed . retired  Neuro/Psych weakness tremor tingling trouble walking  Prior Studies Any changes since last visit?  no  Physicians involved in your care Any  changes since last visit?  no   Family History  Problem Relation Age of Onset  . Anesthesia problems Neg Hx   . ALS Mother    History   Social History  . Marital Status: Married    Spouse Name: Hassan Rowan  . Number of Children: 2  . Years of Education: 10th   Occupational History  .     Social History Main Topics  . Smoking status: Former Smoker -- 1.00 packs/day for 30 years    Types: Cigarettes    Start date: 11/21/2012    Quit date: 12/11/2012  . Smokeless tobacco: Never Used  . Alcohol Use: No  . Drug Use: No  . Sexual Activity: Yes   Other Topics Concern  . None   Social History Narrative   Patient lives at home with his spouse.   Caffeine Use: coffee   Past Surgical History  Procedure Laterality Date  . Back surgery    . Lumbar fusion  2000  . Cholecystectomy  2009  . Anterior cervical decomp/discectomy fusion  12/22/2011    Procedure: ANTERIOR CERVICAL DECOMPRESSION/DISCECTOMY FUSION 3 LEVELS;  Surgeon: Melina Schools, MD;  Location: [place];  Service: Orthopedics;  Laterality: N/A;  ACDF C5-T1  . Anterior cervical decomp/discectomy fusion  06/25/2012    Procedure: ANTERIOR CERVICAL DECOMPRESSION/DISCECTOMY FUSION 1 LEVEL/HARDWARE REMOVAL;  Surgeon: [name], MD;  Location: [place];  Service: Orthopedics;  Laterality: N/A;  Removal anterior cervical plate C5-T1, Explore Fusion, Left  C5-6, C6-7, C7-T1 Re-do Foraminotomy, Left open carpal tunnel release with release of ulnar nerve at [facility], Posterior Cervical Fusion with lateral mass screw  . Carpal tunnel release  06/25/2012    Procedure: CARPAL TUNNEL RELEASE;  Surgeon: [name], MD;  Location: [place];  Service: Orthopedics;  Laterality: Left;  as above  . Posterior cervical fusion/foraminotomy  06/25/2012    Procedure: POSTERIOR CERVICAL FUSION/FORAMINOTOMY LEVEL 1;  Surgeon: [name], MD;  Location: [place];  Service: Orthopedics;  Laterality: N/A;  as above  . Cardiac catheterization    . Coronary  angioplasty  2001    [facility]  . Posterior cervical fusion/foraminotomy N/A 12/17/2013    Procedure: REMOVAL OF POSTERIOR CERVICAL FUSION LATERAL MASS SCREWS AND RODS C5-T1, EXPLORE FUSION, LEFT SIX-SEVEN FORAMINOTOMY;  Surgeon: [name], MD;  Location: [place];  Service: Orthopedics;  Laterality: N/A;   Past Medical History  Diagnosis Date  . Back pain   . Spider bite 2012  . COPD (chronic obstructive pulmonary disease)   . Dysphagia     patient reports d/t surgery, has modified diet, has to tilt head to swallow   . History of blood transfusion 2014    post op bleed  . Depression   . Coronary artery disease   . Stroke     11/2013  . Migraine    There were no vitals taken for this visit.  Opioid Risk Score:   Fall Risk Score: Moderate Fall Risk (6-13 points) (patient previously educated)  Review of Systems  Musculoskeletal: Positive for gait problem.  Neurological: Positive for tremors and weakness.       Tingling  All other systems reviewed and are negative.      Objective:   Physical Exam  Constitutional: He is oriented to person, place, and time. He appears well-developed and well-nourished.  HENT:  Head: Normocephalic and atraumatic.  Neck: Normal range of motion. Neck supple.  Cardiovascular: Normal rate and regular rhythm.   Pulmonary/Chest: Effort normal and breath sounds normal.  Musculoskeletal:  Normal Muscle Bulk and Muscle Testing Reveals: Upper Extremities: Decreased ROM 45 degrees and Muscle Strength  Right 5/5 and Left 4/5 Thoracic and Lumbar Hypersensitivity Lower Extremities: Decreased ROM and Muscle Strength 4/5 Bilateral Lower Extremities Flexion Produces Pain into Lower back and Neck Arises from chair with difficulty using straight cane for support Antalgic Gait   Neurological: He is alert and oriented to person, place, and time.  Skin: Skin is warm and dry.  Psychiatric: He has a normal mood and affect.  Nursing note and vitals  reviewed.         Assessment & Plan:  1. 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 He had anterior cervical decompression and fusion 2 as well as posterior spinal fusion. RX: Increased: Opana 20 mg one tablet every 12 hours #60 40 minutes of face to face patient care time was spent during this visit. All questions were encouraged and answered.  F/U in 1 month

## 2014-10-11 LAB — PMP ALCOHOL METABOLITE (ETG): ETGU: NEGATIVE ng/mL

## 2014-10-14 LAB — PRESCRIPTION MONITORING PROFILE (SOLSTAS)
AMPHETAMINE/METH: NEGATIVE ng/mL
BARBITURATE SCREEN, URINE: NEGATIVE ng/mL
Benzodiazepine Screen, Urine: NEGATIVE ng/mL
Buprenorphine, Urine: NEGATIVE ng/mL
CANNABINOID SCRN UR: NEGATIVE ng/mL
CREATININE, URINE: 48.15 mg/dL (ref >20.0)
Carisoprodol, Urine: NEGATIVE ng/mL
Cocaine Metabolites: NEGATIVE ng/mL
Fentanyl, Ur: NEGATIVE ng/mL
MDMA URINE: NEGATIVE ng/mL
MEPERIDINE UR: NEGATIVE ng/mL
Methadone Screen, Urine: NEGATIVE ng/mL
NITRITES URINE, INITIAL: NEGATIVE ug/mL
Propoxyphene: NEGATIVE ng/mL
TAPENTADOLUR: NEGATIVE ng/mL
Tramadol Scrn, Ur: NEGATIVE ng/mL
Zolpidem, Urine: NEGATIVE ng/mL
pH, Initial: 6.6 pH (ref 4.5–8.9)

## 2014-10-14 LAB — OXYCODONE, URINE (LC/MS-MS)
NOROXYCODONE, UR: 1880 ng/mL — AB (ref <50)
OXYCODONE, UR: 1965 ng/mL — AB (ref <50)
OXYMORPHONE, URINE: 3330 ng/mL — AB (ref <50)

## 2014-10-14 LAB — OPIATES/OPIOIDS (LC/MS-MS)
Codeine Urine: NEGATIVE ng/mL (ref <50)
HYDROMORPHONE: NEGATIVE ng/mL — AB (ref <50)
Hydrocodone: NEGATIVE ng/mL (ref <50)
Morphine Urine: NEGATIVE ng/mL (ref <50)
NOROXYCODONE, UR: 1880 ng/mL — AB (ref <50)
Norhydrocodone, Ur: NEGATIVE ng/mL (ref <50)
OXYCODONE, UR: 1965 ng/mL — AB (ref <50)
Oxymorphone: 3330 ng/mL — AB (ref <50)

## 2014-10-15 ENCOUNTER — Encounter (HOSPITAL_COMMUNITY): Payer: Self-pay

## 2014-10-15 ENCOUNTER — Ambulatory Visit (HOSPITAL_COMMUNITY): Payer: 59 | Attending: Registered Nurse

## 2014-10-15 DIAGNOSIS — M25651 Stiffness of right hip, not elsewhere classified: Secondary | ICD-10-CM | POA: Diagnosis not present

## 2014-10-15 DIAGNOSIS — M545 Low back pain: Secondary | ICD-10-CM | POA: Diagnosis not present

## 2014-10-15 DIAGNOSIS — M542 Cervicalgia: Secondary | ICD-10-CM | POA: Insufficient documentation

## 2014-10-15 DIAGNOSIS — M25652 Stiffness of left hip, not elsewhere classified: Secondary | ICD-10-CM | POA: Insufficient documentation

## 2014-10-15 DIAGNOSIS — R531 Weakness: Secondary | ICD-10-CM

## 2014-10-15 DIAGNOSIS — M6281 Muscle weakness (generalized): Secondary | ICD-10-CM | POA: Insufficient documentation
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
## 2014-10-17 NOTE — Progress Notes (Signed)
Urine drug screen for this encounter is consistent for prescribed medication 

## 2014-11-14 ENCOUNTER — Ambulatory Visit (HOSPITAL_BASED_OUTPATIENT_CLINIC_OR_DEPARTMENT_OTHER): Payer: 59 | Admitting: Physical Medicine & Rehabilitation

## 2014-11-14 ENCOUNTER — Other Ambulatory Visit: Payer: Self-pay | Admitting: Physical Medicine & Rehabilitation

## 2014-11-14 ENCOUNTER — Encounter: Payer: Self-pay | Admitting: Physical Medicine & Rehabilitation

## 2014-11-14 ENCOUNTER — Encounter: Payer: 59 | Attending: Physical Medicine & Rehabilitation

## 2014-11-14 VITALS — BP 117/81 | HR 99 | Resp 14

## 2014-11-14 DIAGNOSIS — Z5181 Encounter for therapeutic drug level monitoring: Secondary | ICD-10-CM

## 2014-11-14 DIAGNOSIS — G249 Dystonia, unspecified: Secondary | ICD-10-CM | POA: Insufficient documentation

## 2014-11-14 DIAGNOSIS — M961 Postlaminectomy syndrome, not elsewhere classified: Secondary | ICD-10-CM | POA: Diagnosis not present

## 2014-11-14 DIAGNOSIS — Z79899 Other long term (current) drug therapy: Secondary | ICD-10-CM

## 2014-11-14 DIAGNOSIS — M4722 Other spondylosis with radiculopathy, cervical region: Secondary | ICD-10-CM

## 2014-11-14 DIAGNOSIS — G8928 Other chronic postprocedural pain: Secondary | ICD-10-CM | POA: Insufficient documentation

## 2014-11-14 DIAGNOSIS — M542 Cervicalgia: Secondary | ICD-10-CM | POA: Diagnosis not present

## 2014-11-14 DIAGNOSIS — G894 Chronic pain syndrome: Secondary | ICD-10-CM | POA: Diagnosis not present

## 2014-11-14 DIAGNOSIS — M436 Torticollis: Secondary | ICD-10-CM | POA: Diagnosis not present

## 2014-11-14 MED ORDER — FENTANYL 50 MCG/HR TD PT72: 50.0000 ug | MEDICATED_PATCH | TRANSDERMAL | Status: DC

## 2014-11-14 NOTE — Progress Notes (Signed)
Subjective:    Patient ID: Bradley Rice, male    DOB: [birth year], 54 y.o.   MRN: [id number] 54 year old male with history of failed cervical fusion has had both anterior fusion, revision anterior fusion as well as posterior fusion. Chronic pain for the last 2+ years.  Head initial rehabilitation evaluation prostate 2 month ago.Urine drug screen was appropriate.  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 He states his pain relief from Opana is minimal. We discussed that chronic opioids only give a 10-30% reduction in pain. Pain Inventory Average Pain 10 Pain Right Now 8 My pain is sharp, stabbing and aching  In the last 24 hours, has pain interfered with the following? General activity 7 Relation with others 6 Enjoyment of life 8 What TIME of day is your pain at its worst? daytime and evening Sleep (in general) Good  Pain is worse with: walking, bending and sitting Pain improves with: medication, TENS and other Relief from Meds: 2  Mobility walk without assistance use a cane how many minutes can you walk? 5 ability to climb steps?  no do you drive?  yes  Function disabled: date disabled 2001  Neuro/Psych weakness numbness tingling trouble walking  Prior Studies Any changes since last visit?  no  Physicians involved in your care Any changes since last visit?  no   Family History  Problem Relation Age of Onset  . Anesthesia problems Neg Hx   . ALS Mother    History   Social History  . Marital Status: Married    Spouse Name: Hassan Rowan  . Number of Children: 2  . Years of Education: 10th   Occupational History  .     Social History Main Topics  . Smoking status:  Former Smoker -- 1.00 packs/day for 30 years    Types: Cigarettes    Start date: 11/21/2012    Quit date: 12/11/2012  . Smokeless tobacco: Never Used  . Alcohol Use: No  . Drug Use: No  . Sexual Activity: Yes   Other Topics Concern  . None   Social History Narrative   Patient lives at home with his spouse.   Caffeine Use: coffee   Past Surgical History  Procedure Laterality Date  . Back surgery    . Lumbar fusion  2000  . Cholecystectomy  2009  . Anterior cervical decomp/discectomy fusion  12/22/2011    Procedure: ANTERIOR CERVICAL DECOMPRESSION/DISCECTOMY FUSION 3 LEVELS;  Surgeon: Melina Schools, MD;  Location: [place];  Service: Orthopedics;  Laterality: N/A;  ACDF C5-T1  . Anterior cervical decomp/discectomy fusion  06/25/2012    Procedure: ANTERIOR CERVICAL DECOMPRESSION/DISCECTOMY FUSION 1 LEVEL/HARDWARE REMOVAL;  Surgeon: [name], MD;  Location: [place];  Service: Orthopedics;  Laterality: N/A;  Removal anterior cervical plate C5-T1, Explore Fusion, Left C5-6, C6-7, C7-T1 Re-do Foraminotomy, Left open carpal tunnel release with release of ulnar nerve at [facility], Posterior Cervical Fusion with lateral mass screw  . Carpal tunnel release  06/25/2012    Procedure: CARPAL TUNNEL RELEASE;  Surgeon: [name], MD;  Location: [place];  Service: Orthopedics;  Laterality: Left;  as above  . Posterior cervical fusion/foraminotomy  06/25/2012  Procedure: POSTERIOR CERVICAL FUSION/FORAMINOTOMY LEVEL 1;  Surgeon: [name], MD;  Location: [place];  Service: Orthopedics;  Laterality: N/A;  as above  . Cardiac catheterization    . Coronary angioplasty  2001    [facility]  . Posterior cervical fusion/foraminotomy N/A 12/17/2013    Procedure: REMOVAL OF POSTERIOR CERVICAL FUSION LATERAL MASS SCREWS AND RODS C5-T1, EXPLORE FUSION, LEFT SIX-SEVEN FORAMINOTOMY;  Surgeon: [name], MD;  Location: [place];  Service: Orthopedics;  Laterality: N/A;   Past Medical History  Diagnosis  Date  . Back pain   . Spider bite 2012  . COPD (chronic obstructive pulmonary disease)   . Dysphagia     patient reports d/t surgery, has modified diet, has to tilt head to swallow   . History of blood transfusion 2014    post op bleed  . Depression   . Coronary artery disease   . Stroke     11/2013  . Migraine    BP 117/81 mmHg  Pulse 99  Resp 14  SpO2 99%  Opioid Risk Score:   Fall Risk Score: Moderate Fall Risk (6-13 points) (previously educated and given handout)`1  Depression screen PHQ 2/9  Depression screen PHQ 2/9 10/10/2014  Decreased Interest 3  Down, Depressed, Hopeless 0  PHQ - 2 Score 3  Altered sleeping 0  Tired, decreased energy 2  Change in appetite 0  Feeling bad or failure about yourself  0  Trouble concentrating 0  Moving slowly or fidgety/restless 0  Suicidal thoughts 0  PHQ-9 Score 5     Review of Systems  Gastrointestinal: Positive for nausea.  Neurological: Positive for weakness and numbness.       Tingling  Hematological: Bruises/bleeds easily.  All other systems reviewed and are negative.      Objective:   Physical Exam  Constitutional: He is oriented to person, place, and time. He appears well-developed and well-nourished.  HENT:  Head: Normocephalic and atraumatic.  Eyes: Conjunctivae and EOM are normal. Pupils are equal, round, and reactive to light.  Musculoskeletal:  Cervical range of motion 50% rotation to left, 25% to right, 25% extension, 25% flexion  Neurological: He is alert and oriented to person, place, and time.  Motor strength is 4/5 in the left deltoid, biceps, triceps, grip 5/5 in the right deltoid, biceps, triceps, grip 5/5 bilateral hip flexor and extensor ankle dorsi flexors and plantar flexor Straight leg raising test is normal  Psychiatric: He has a normal mood and affect.  Nursing note and vitals reviewed.         Assessment & Plan:  1.  Cervical post laminectomy syndrome history of failed cervical  fusion. He had both anterior and posterior fusions. His chronic pain has lasted over 2 years. He has not responded to Botox injection despite some symptoms that suggested cervical dystonia.  We discussed medication management, he wishes to try a new medication. We discussed that this may not result in any better pain relief. We'll trial a fentanyl 50 g patch every 3 days in place of Opana. He states he is off his verapamil so that there is no interaction. Practitioner follow-up 1 month Repeat urine drug screen today given the patient is requesting changes to his medication regimen. Opioid consent form signed today Controlled substance agreement reviewed and signed today  In terms of power chair really don't think he needs it in the home environment. He is ambulatory with a cane.

## 2014-11-14 NOTE — Patient Instructions (Signed)
Trial of fentanyl patch- all narcotics give only a 10-30% reduction of pain when used long term

## 2014-11-15 LAB — PMP ALCOHOL METABOLITE (ETG): ETGU: NEGATIVE ng/mL

## 2014-11-17 ENCOUNTER — Ambulatory Visit (INDEPENDENT_AMBULATORY_CARE_PROVIDER_SITE_OTHER): Payer: 59 | Admitting: Family

## 2014-11-17 ENCOUNTER — Encounter: Payer: Self-pay | Admitting: Family

## 2014-11-17 VITALS — BP 139/84 | HR 94 | Temp 97.2°F | Ht 72.0 in | Wt 171.0 lb

## 2014-11-17 DIAGNOSIS — M4722 Other spondylosis with radiculopathy, cervical region: Secondary | ICD-10-CM

## 2014-11-17 DIAGNOSIS — M961 Postlaminectomy syndrome, not elsewhere classified: Secondary | ICD-10-CM

## 2014-11-17 DIAGNOSIS — G243 Spasmodic torticollis: Secondary | ICD-10-CM

## 2014-11-17 DIAGNOSIS — G5602 Carpal tunnel syndrome, left upper limb: Secondary | ICD-10-CM | POA: Diagnosis not present

## 2014-11-17 DIAGNOSIS — M47819 Spondylosis without myelopathy or radiculopathy, site unspecified: Secondary | ICD-10-CM

## 2014-11-17 DIAGNOSIS — T849XXS Unspecified complication of internal orthopedic prosthetic device, implant and graft, sequela: Secondary | ICD-10-CM

## 2014-11-17 DIAGNOSIS — Z720 Tobacco use: Secondary | ICD-10-CM

## 2014-11-17 DIAGNOSIS — F411 Generalized anxiety disorder: Secondary | ICD-10-CM | POA: Diagnosis not present

## 2014-11-17 DIAGNOSIS — R0989 Other specified symptoms and signs involving the circulatory and respiratory systems: Secondary | ICD-10-CM

## 2014-11-17 DIAGNOSIS — Z716 Tobacco abuse counseling: Secondary | ICD-10-CM

## 2014-11-17 DIAGNOSIS — K224 Dyskinesia of esophagus: Secondary | ICD-10-CM | POA: Diagnosis not present

## 2014-11-17 DIAGNOSIS — IMO0001 Reserved for inherently not codable concepts without codable children: Secondary | ICD-10-CM

## 2014-11-17 MED ORDER — METHOCARBAMOL 500 MG PO TABS: 500.0000 mg | ORAL_TABLET | Freq: Four times a day (QID) | ORAL | Status: DC | PRN

## 2014-11-17 MED ORDER — OMEPRAZOLE 20 MG PO CPDR: 20.0000 mg | DELAYED_RELEASE_CAPSULE | Freq: Every day | ORAL | Status: DC

## 2014-11-17 MED ORDER — VARENICLINE TARTRATE 1 MG PO TABS: 1.0000 mg | ORAL_TABLET | Freq: Two times a day (BID) | ORAL | Status: DC

## 2014-11-17 MED ORDER — FENTANYL 50 MCG/HR TD PT72: 50.0000 ug | MEDICATED_PATCH | TRANSDERMAL | Status: DC

## 2014-11-17 MED ORDER — CLONAZEPAM 0.5 MG PO TABS
0.5000 mg | ORAL_TABLET | Freq: Two times a day (BID) | ORAL | Status: DC | PRN
Start: 2014-11-17 — End: 2015-02-17

## 2014-11-17 MED ORDER — VARENICLINE TARTRATE 0.5 MG X 11 & 1 MG X 42 PO MISC: ORAL | Status: DC

## 2014-11-17 NOTE — Progress Notes (Signed)
Subjective:    Patient ID: Bradley Rice, male    DOB: [birth year], 54 y.o.   MRN: [id number]  HPI Pt presents  to the office today to discuss problems with his is throat. Pt states he feels like it is "closing up". Pt states this started two weeks ago. Pt states eating or drinking anything makes it worse. Pt states when it starts to "close up" it makes him have "couging spells". Pt states he has had trouble in the past swallowing in the past, but seems to have gotten worse over the last couple of weeks.   Pt has had multiple surgeries on his cervical spine. Pt has history of cervical spondylosis, DDD and had failed cervical fusion. Pt states he has been getting his medication from his neurologist, but pt states he would like to start getting them from [place].    Review of Systems  Constitutional: Negative.   HENT: Negative.   Respiratory: Negative.   Cardiovascular: Negative.   Gastrointestinal: Negative.   Endocrine: Negative.   Genitourinary: Negative.   Musculoskeletal: Negative.   Neurological: Negative.   Hematological: Negative.   Psychiatric/Behavioral: Negative.   All other systems reviewed and are negative.      Objective:   Physical Exam  Constitutional: He is oriented to person, place, and time. He appears well-developed and well-nourished. No distress.  HENT:  Head: Normocephalic.  Right Ear: External ear normal.  Left Ear: External ear normal.  Mouth/Throat: Oropharynx is clear and moist.  Eyes: Pupils are equal, round, and reactive to light. Right eye exhibits no discharge. Left eye exhibits no discharge.  Neck: Normal range of motion. Neck supple. No thyromegaly present.  Cardiovascular: Normal rate, regular rhythm, normal heart sounds and intact distal pulses.   No murmur heard. Pulmonary/Chest: Effort normal. No respiratory distress. He has wheezes.  Abdominal: Soft. Bowel sounds are normal. He exhibits no distension. There is no tenderness.  Musculoskeletal:  Normal range of motion. He exhibits no edema or tenderness.  Pt walking with assistance of a cane  Neurological: He is alert and oriented to person, place, and time. He has normal reflexes. No cranial nerve deficit.  Skin: Skin is warm and dry. No rash noted. No erythema.  Psychiatric: He has a normal mood and affect. His behavior is normal. Judgment and thought content normal.  Vitals reviewed.   BP 139/84 mmHg  Pulse 94  Temp(Src) 97.2 F (36.2 C) (Oral)  Ht 6' (1.829 m)  Wt 171 lb (77.565 kg)  BMI 23.19 kg/m2       Assessment & Plan:  1. Postlaminectomy syndrome, cervical region - CMP14+EGFR  2. Failed cervical fusion, sequela - CMP14+EGFR  3. Carpal tunnel syndrome of left wrist - CMP14+EGFR - clonazePAM (KLONOPIN) 0.5 MG tablet; Take 1 tablet (0.5 mg total) by mouth 2 (two) times daily as needed for anxiety.  Dispense: 60 tablet; Refill: 3 - fentaNYL (DURAGESIC - DOSED MCG/HR) 50 MCG/HR; Place 1 patch (50 mcg total) onto the skin every 3 (three) days.  Dispense: 10 patch; Refill: 0 - methocarbamol (ROBAXIN) 500 MG tablet; Take 1 tablet (500 mg total) by mouth every 6 (six) hours as needed for muscle spasms.  Dispense: 90 tablet; Refill: 3  4. Cervical spondylosis with radiculopathy - CMP14+EGFR - clonazePAM (KLONOPIN) 0.5 MG tablet; Take 1 tablet (0.5 mg total) by mouth 2 (two) times daily as needed for anxiety.  Dispense: 60 tablet; Refill: 3 - fentaNYL (DURAGESIC - DOSED MCG/HR) 50 MCG/HR; Place 1 patch (  50 mcg total) onto the skin every 3 (three) days.  Dispense: 10 patch; Refill: 0 - methocarbamol (ROBAXIN) 500 MG tablet; Take 1 tablet (500 mg total) by mouth every 6 (six) hours as needed for muscle spasms.  Dispense: 90 tablet; Refill: 3  5. Cervical dystonia - CMP14+EGFR - clonazePAM (KLONOPIN) 0.5 MG tablet; Take 1 tablet (0.5 mg total) by mouth 2 (two) times daily as needed for anxiety.  Dispense: 60 tablet; Refill: 3 - fentaNYL (DURAGESIC - DOSED MCG/HR) 50  MCG/HR; Place 1 patch (50 mcg total) onto the skin every 3 (three) days.  Dispense: 10 patch; Refill: 0 - methocarbamol (ROBAXIN) 500 MG tablet; Take 1 tablet (500 mg total) by mouth every 6 (six) hours as needed for muscle spasms.  Dispense: 90 tablet; Refill: 3  6. Multilevel spondylosis - CMP14+EGFR - clonazePAM (KLONOPIN) 0.5 MG tablet; Take 1 tablet (0.5 mg total) by mouth 2 (two) times daily as needed for anxiety.  Dispense: 60 tablet; Refill: 3 - fentaNYL (DURAGESIC - DOSED MCG/HR) 50 MCG/HR; Place 1 patch (50 mcg total) onto the skin every 3 (three) days.  Dispense: 10 patch; Refill: 0 - methocarbamol (ROBAXIN) 500 MG tablet; Take 1 tablet (500 mg total) by mouth every 6 (six) hours as needed for muscle spasms.  Dispense: 90 tablet; Refill: 3  7. Encounter for smoking cessation counseling - CMP14+EGFR - varenicline (CHANTIX STARTING MONTH PAK) 0.5 MG X 11 & 1 MG X 42 tablet; Take one 0.5 mg tablet by mouth once daily for 3 days, then increase to one 0.5 mg tablet twice daily for 4 days, then increase to one 1 mg tablet twice daily.  Dispense: 53 tablet; Refill: 0 - varenicline (CHANTIX CONTINUING MONTH PAK) 1 MG tablet; Take 1 tablet (1 mg total) by mouth 2 (two) times daily.  Dispense: 60 tablet; Refill: 1  8. Esophageal spasm - CMP14+EGFR Pt started on omeprazole 20 mg today - Thyroid Panel With TSH - Ambulatory referral to Gastroenterology  9. Choking episode - CMP14+EGFR - Thyroid Panel With TSH - Ambulatory referral to Gastroenterology   11. GAD (generalized anxiety disorder) - CMP14+EGFR - clonazePAM (KLONOPIN) 0.5 MG tablet; Take 1 tablet (0.5 mg total) by mouth 2 (two) times daily as needed for anxiety.  Dispense: 60 tablet; Refill: 3 - Thyroid Panel With TSH   Continue all meds, Will call with GI appt Labs pending Health Maintenance reviewed Diet and exercise encouraged RTO 3 months  Evelina Dun, FNP

## 2014-11-17 NOTE — Patient Instructions (Signed)
Health Maintenance A healthy lifestyle and preventative care can promote health and wellness.  Maintain regular health, dental, and eye exams.  Eat a healthy diet. Foods like vegetables, fruits, whole grains, low-fat dairy products, and lean protein foods contain the nutrients you need and are low in calories. Decrease your intake of foods high in solid fats, added sugars, and salt. Get information about a proper diet from your health care provider, if necessary.  Regular physical exercise is one of the most important things you can do for your health. Most adults should get at least 150 minutes of moderate-intensity exercise (any activity that increases your heart rate and causes you to sweat) each week. In addition, most adults need muscle-strengthening exercises on 2 or more days a week.   Maintain a healthy weight. The body mass index (BMI) is a screening tool to identify possible weight problems. It provides an estimate of body fat based on height and weight. Your health care provider can find your BMI and can help you achieve or maintain a healthy weight. For males 20 years and older:  A BMI below 18.5 is considered underweight.  A BMI of 18.5 to 24.9 is normal.  A BMI of 25 to 29.9 is considered overweight.  A BMI of 30 and above is considered obese.  Maintain normal blood lipids and cholesterol by exercising and minimizing your intake of saturated fat. Eat a balanced diet with plenty of fruits and vegetables. Blood tests for lipids and cholesterol should begin at age 20 and be repeated every 5 years. If your lipid or cholesterol levels are high, you are over age 50, or you are at high risk for heart disease, you may need your cholesterol levels checked more frequently.Ongoing high lipid and cholesterol levels should be treated with medicines if diet and exercise are not working.  If you smoke, find out from your health care provider how to quit. If you do not use tobacco, do not  start.  Lung cancer screening is recommended for adults aged 55-80 years who are at high risk for developing lung cancer because of a history of smoking. A yearly low-dose CT scan of the lungs is recommended for people who have at least a 30-pack-year history of smoking and are current smokers or have quit within the past 15 years. A pack year of smoking is smoking an average of 1 pack of cigarettes a day for 1 year (for example, a 30-pack-year history of smoking could mean smoking 1 pack a day for 30 years or 2 packs a day for 15 years). Yearly screening should continue until the smoker has stopped smoking for at least 15 years. Yearly screening should be stopped for people who develop a health problem that would prevent them from having lung cancer treatment.  If you choose to drink alcohol, do not have more than 2 drinks per day. One drink is considered to be 12 oz (360 mL) of beer, 5 oz (150 mL) of wine, or 1.5 oz (45 mL) of liquor.  Avoid the use of street drugs. Do not share needles with anyone. Ask for help if you need support or instructions about stopping the use of drugs.  High blood pressure causes heart disease and increases the risk of stroke. Blood pressure should be checked at least every 1-2 years. Ongoing high blood pressure should be treated with medicines if weight loss and exercise are not effective.  If you are 45-79 years old, ask your health care provider if   you should take aspirin to prevent heart disease.  Diabetes screening involves taking a blood sample to check your fasting blood sugar level. This should be done once every 3 years after age 45 if you are at a normal weight and without risk factors for diabetes. Testing should be considered at a younger age or be carried out more frequently if you are overweight and have at least 1 risk factor for diabetes.  Colorectal cancer can be detected and often prevented. Most routine colorectal cancer screening begins at the age of 50  and continues through age 75. However, your health care provider may recommend screening at an earlier age if you have risk factors for colon cancer. On a yearly basis, your health care provider may provide home test kits to check for hidden blood in the stool. A small camera at the end of a tube may be used to directly examine the colon (sigmoidoscopy or colonoscopy) to detect the earliest forms of colorectal cancer. Talk to your health care provider about this at age 50 when routine screening begins. A direct exam of the colon should be repeated every 5-10 years through age 75, unless early forms of precancerous polyps or small growths are found.  People who are at an increased risk for hepatitis B should be screened for this virus. You are considered at high risk for hepatitis B if:  You were born in a country where hepatitis B occurs often. Talk with your health care provider about which countries are considered high risk.  Your parents were born in a high-risk country and you have not received a shot to protect against hepatitis B (hepatitis B vaccine).  You have HIV or AIDS.  You use needles to inject street drugs.  You live with, or have sex with, someone who has hepatitis B.  You are a man who has sex with other men (MSM).  You get hemodialysis treatment.  You take certain medicines for conditions like cancer, organ transplantation, and autoimmune conditions.  Hepatitis C blood testing is recommended for all people born from 1945 through 1965 and any individual with known risk factors for hepatitis C.  Healthy men should no longer receive prostate-specific antigen (PSA) blood tests as part of routine cancer screening. Talk to your health care provider about prostate cancer screening.  Testicular cancer screening is not recommended for adolescents or adult males who have no symptoms. Screening includes self-exam, a health care provider exam, and other screening tests. Consult with your  health care provider about any symptoms you have or any concerns you have about testicular cancer.  Practice safe sex. Use condoms and avoid high-risk sexual practices to reduce the spread of sexually transmitted infections (STIs).  You should be screened for STIs, including gonorrhea and chlamydia if:  You are sexually active and are younger than 24 years.  You are older than 24 years, and your health care provider tells you that you are at risk for this type of infection.  Your sexual activity has changed since you were last screened, and you are at an increased risk for chlamydia or gonorrhea. Ask your health care provider if you are at risk.  If you are at risk of being infected with HIV, it is recommended that you take a prescription medicine daily to prevent HIV infection. This is called pre-exposure prophylaxis (PrEP). You are considered at risk if:  You are a man who has sex with other men (MSM).  You are a heterosexual man who   is sexually active with multiple partners.  You take drugs by injection.  You are sexually active with a partner who has HIV.  Talk with your health care provider about whether you are at high risk of being infected with HIV. If you choose to begin PrEP, you should first be tested for HIV. You should then be tested every 3 months for as long as you are taking PrEP.  Use sunscreen. Apply sunscreen liberally and repeatedly throughout the day. You should seek shade when your shadow is shorter than you. Protect yourself by wearing long sleeves, pants, a wide-brimmed hat, and sunglasses year round whenever you are outdoors.  Tell your health care provider of new moles or changes in moles, especially if there is a change in shape or color. Also, tell your health care provider if a mole is larger than the size of a pencil eraser.  A one-time screening for abdominal aortic aneurysm (AAA) and surgical repair of large AAAs by ultrasound is recommended for men aged  61-75 years who are current or former smokers.  Stay current with your vaccines (immunizations). Document Released: 01/14/2008 Document Revised: 07/23/2013 Document Reviewed: 12/13/2010 [facility] Patient Information 2015 [place], [place]. This information is not intended to replace advice given to you by your health care provider. Make sure you discuss any questions you have with your health care provider. Esophageal Spasm Esophageal spasm is an uncoordinated contraction of the muscles of the esophagus (the tube which carries food from your mouth to your stomach). Normally, the muscles of the esophagus alternate between contraction and relaxation starting from the top of the esophagus and working down to the bottom. This moves the food from the mouth to the stomach. In esophageal spasm, all the muscles contract at once. This causes pain and fails to move the food along. As a result, you may have trouble swallowing.  Women are more likely than men to have esophageal spasm. The cause of the spasms is not known. Sometimes eating hot or cold foods triggers the condition and this may be due to an overly sensitive esophagus. This is not an infectious disease and cannot be passed to others. SYMPTOMS  Symptoms of esophageal spasm may include: chest pain, burning or pain with swallowing, and difficulty swallowing.  DIAGNOSIS  Esophageal spasm can be diagnosed by a test called manometry (pressure studies of the esophagus). In this test, a special tube is inserted down the esophagus. The tube measures the muscle activity of the esophagus. Abnormal contractions mixed with normal movement helps confirm the diagnosis.  A person with a hypersensitive esophagus may be diagnosed by inflating a long balloon in the person's esophagus. If this causes the same symptoms, preventive methods may work. PREVENTION  Avoid hot or cold foods if that seems to be a trigger. PROGNOSIS  This condition does not go away, nor is  treatment entirely satisfactory. Patients need to be careful of what they eat. They need to continue on medication if a useful one is found. Fortunately, the condition does not get progressively worse as time passes. Esophageal spasm does not usually lead to more serious problems but sometimes the pain can be disabling. If a person becomes afraid to eat they may become malnourished and lose weight.  TREATMENT   A procedure in which instruments of increasing size are inserted through the esophagus to enlarge (dilate) it are used.  Medications that decrease acid-production of the stomach may be used such as proton-pump inhibitors or H2-blockers.  Medications of several  types can be used to relax the muscles of the esophagus.  An individual with a hypersensitive esophagus sometimes improves with low doses of medications normally used for depression.  No treatment for esophageal spasm is effective for everyone. Often several approaches will be tried before one works. In many cases, the symptoms will improve, but will not go away completely.  For severe cases, relief is obtained two-thirds of the time by cutting the muscles along the entire length of the esophagus. This is a major surgical procedure.  Your symptoms are usually the best guide to how well the treatment for esophageal spasm works. SIDE EFFECTS OF TREATMENTS  Nitrates can cause headaches and low blood pressure.  Calcium channel blockers can cause:  Feeling sick to your stomach (nausea).  Constipation and other side effects.  Antidepressants can cause side effects that depend on the medication used. HOME CARE INSTRUCTIONS   Let your caregiver know if problems are getting worse, or if you get food stuck in your esophagus for longer than 1 hour or as directed and are unable to swallow liquid.  Take medications as directed and with permission of your caregiver. Ask about what to do if a medication seems to get stuck in your  esophagus. Only take over-the-counter or prescription medicines for pain, discomfort, or fever as directed by your caregiver.  Soft and liquid foods pass more easily than solid pieces. SEEK IMMEDIATE MEDICAL CARE IF:   You develop severe chest pain, especially if the pain is crushing or pressure-like and spreads to the arms, back, neck, or jaw, or if you have sweating, nausea, or shortness of breath. THIS COULD BE AN EMERGENCY. Do not wait to see if the pain will go away. Get medical help at once. Call 911 or 0 (operator). DO NOT drive yourself to the hospital.  Your chest pain gets worse and does not go away with rest.  You have an attack of chest pain lasting longer than usual despite rest and treatment with the medications your physician has prescribed.  You wake from sleep with chest pain or shortness of breath.  You feel dizzy or faint.  You have chest pain, not typical of your usual pain, caused by your esophagus for which you originally saw your caregiver. MAKE SURE YOU:   Understand these instructions.  Will watch your condition.  Will get help right away if you are not doing well or get worse. Document Released: 10/08/2002 Document Revised: 10/10/2011 Document Reviewed: 10/11/2013 [facility] Patient Information 2015 [place], [place]. This information is not intended to replace advice given to you by your health care provider. Make sure you discuss any questions you have with your health care provider.

## 2014-11-18 ENCOUNTER — Telehealth: Payer: Self-pay

## 2014-11-18 ENCOUNTER — Telehealth: Payer: Self-pay | Admitting: Family

## 2014-11-18 LAB — OPIATES/OPIOIDS (LC/MS-MS)
Codeine Urine: NEGATIVE ng/mL (ref <50)
HYDROMORPHONE: NEGATIVE ng/mL (ref <50)
Hydrocodone: NEGATIVE ng/mL (ref <50)
Morphine Urine: NEGATIVE ng/mL (ref <50)
NOROXYCODONE, UR: 5141 ng/mL — AB (ref <50)
Norhydrocodone, Ur: NEGATIVE ng/mL (ref <50)
Oxycodone, ur: 4931 ng/mL — AB (ref <50)
Oxymorphone: 3757 ng/mL (ref <50)

## 2014-11-18 LAB — OXYCODONE, URINE (LC/MS-MS)
Noroxycodone, Ur: 5141 ng/mL — AB (ref <50)
OXYMORPHONE, URINE: 3757 ng/mL (ref <50)
Oxycodone, ur: 4931 ng/mL — AB (ref <50)

## 2014-11-18 LAB — CMP14+EGFR
A/G RATIO: 1.7 (ref 1.1–2.5)
ALK PHOS: 83 IU/L (ref 39–117)
ALT: 18 IU/L (ref 0–44)
AST: 14 IU/L (ref 0–40)
Albumin: 4.4 g/dL (ref 3.5–5.5)
BUN/Creatinine Ratio: 13 (ref 9–20)
BUN: 9 mg/dL (ref 6–24)
Bilirubin Total: 0.3 mg/dL (ref 0.0–1.2)
CALCIUM: 9.3 mg/dL (ref 8.7–10.2)
CO2: 24 mmol/L (ref 18–29)
Chloride: 99 mmol/L (ref 97–108)
Creatinine, Ser: 0.71 mg/dL — ABNORMAL LOW (ref 0.76–1.27)
GFR calc non Af Amer: 108 mL/min/{1.73_m2} (ref >59)
GFR, EST AFRICAN AMERICAN: 125 mL/min/{1.73_m2} (ref >59)
GLOBULIN, TOTAL: 2.6 g/dL (ref 1.5–4.5)
GLUCOSE: 92 mg/dL (ref 65–99)
POTASSIUM: 4.8 mmol/L (ref 3.5–5.2)
Sodium: 138 mmol/L (ref 134–144)
TOTAL PROTEIN: 7 g/dL (ref 6.0–8.5)

## 2014-11-18 LAB — BENZODIAZEPINES (GC/LC/MS), URINE
Alprazolam metabolite (GC/LC/MS), ur confirm: NEGATIVE ng/mL (ref <25)
CLONAZEPAU: 178 ng/mL — AB (ref <25)
FLURAZEPAMU: NEGATIVE ng/mL (ref <50)
Lorazepam (GC/LC/MS), ur confirm: NEGATIVE ng/mL (ref <50)
Midazolam (GC/LC/MS), ur confirm: NEGATIVE ng/mL (ref <50)
NORDIAZEPAMU: NEGATIVE ng/mL (ref <50)
Oxazepam (GC/LC/MS), ur confirm: NEGATIVE ng/mL (ref <50)
TRIAZOLAMU: NEGATIVE ng/mL (ref <50)
Temazepam (GC/LC/MS), ur confirm: NEGATIVE ng/mL (ref <50)

## 2014-11-18 LAB — THYROID PANEL WITH TSH
Free Thyroxine Index: 2.8 (ref 1.2–4.9)
T3 UPTAKE RATIO: 27 % (ref 24–39)
T4, Total: 10.3 ug/dL (ref 4.5–12.0)
TSH: 0.889 u[IU]/mL (ref 0.450–4.500)

## 2014-11-18 NOTE — Telephone Encounter (Signed)
-----   Message from Sharion Balloon, [place] sent at 11/18/2014  8:25 AM EDT ----- Kidney and liver function stable Thyroid stable

## 2014-11-19 LAB — PRESCRIPTION MONITORING PROFILE (SOLSTAS)
Amphetamine/Meth: NEGATIVE ng/mL
Barbiturate Screen, Urine: NEGATIVE ng/mL
Buprenorphine, Urine: NEGATIVE ng/mL
CARISOPRODOL, URINE: NEGATIVE ng/mL
COCAINE METABOLITES: NEGATIVE ng/mL
Cannabinoid Scrn, Ur: NEGATIVE ng/mL
Creatinine, Urine: 150.66 mg/dL (ref >20.0)
Fentanyl, Ur: NEGATIVE ng/mL
MDMA URINE: NEGATIVE ng/mL
Meperidine, Ur: NEGATIVE ng/mL
Methadone Screen, Urine: NEGATIVE ng/mL
Nitrites, Initial: NEGATIVE ug/mL
Propoxyphene: NEGATIVE ng/mL
TAPENTADOLUR: NEGATIVE ng/mL
TRAMADOL UR: NEGATIVE ng/mL
Zolpidem, Urine: NEGATIVE ng/mL
pH, Initial: 5.6 pH (ref 4.5–8.9)

## 2014-11-20 ENCOUNTER — Telehealth: Payer: Self-pay

## 2014-11-20 MED ORDER — BUPROPION HCL ER (SR) 150 MG PO TB12: 150.0000 mg | ORAL_TABLET | Freq: Two times a day (BID) | ORAL | Status: DC

## 2014-11-20 NOTE — Telephone Encounter (Signed)
x

## 2014-11-20 NOTE — Telephone Encounter (Signed)
Insurance denied prior authorization for Chantix   Needs to try Bupropion

## 2014-11-24 ENCOUNTER — Telehealth: Payer: Self-pay | Admitting: *Deleted

## 2014-11-24 NOTE — Progress Notes (Signed)
Urine drug screen for this encounter is inconsistent.  He is prescribed oxymorphone (Opana ER) which is present but he also is positive oxycodone in his urine.

## 2014-11-24 NOTE — Telephone Encounter (Signed)
Left voice mail for Bradley Rice to call and speak with me so that i can discuss his UDS and his receiving narcotics from Dr Louanne Skye while being prescribed narcotics from this office. He is being discharged from this office.

## 2014-11-25 NOTE — Telephone Encounter (Signed)
Attempted to reach Bradley Rice again this morning and no answer. I left message yesterday on home phone.  Mobile phone does not have a voicemail set up and no answer.

## 2014-11-25 NOTE — Telephone Encounter (Signed)
Rangel returned my call and I have notified him he is being discharged from our clinic for receiving narcotics from another provider while receiving narcotics from this office.  He replied "ok".  A letter is being mailed.  Dr Otho Ket office was made aware since they were the office that continued to prescribe.

## 2014-11-28 ENCOUNTER — Ambulatory Visit: Payer: 59 | Admitting: Internal Medicine

## 2014-12-02 ENCOUNTER — Encounter: Payer: Self-pay | Admitting: Internal Medicine

## 2014-12-02 NOTE — Progress Notes (Signed)
Patient ID: Bradley Rice, male   DOB: [birth year], 54 y.o.   MRN: [id number] The patient's chart has been reviewed by Dr. Carlean Purl  and the recommendations are noted below.  .   Follow-up advised. Schedule patient for next available appointment. Outcome of communication with the patient: letter sent

## 2014-12-09 ENCOUNTER — Encounter: Payer: Self-pay | Admitting: Family

## 2014-12-09 ENCOUNTER — Ambulatory Visit (INDEPENDENT_AMBULATORY_CARE_PROVIDER_SITE_OTHER): Payer: 59 | Admitting: Family

## 2014-12-09 VITALS — BP 126/79 | HR 101 | Temp 97.2°F | Ht 72.0 in | Wt 172.2 lb

## 2014-12-09 DIAGNOSIS — G5602 Carpal tunnel syndrome, left upper limb: Secondary | ICD-10-CM

## 2014-12-09 DIAGNOSIS — M4722 Other spondylosis with radiculopathy, cervical region: Secondary | ICD-10-CM | POA: Diagnosis not present

## 2014-12-09 DIAGNOSIS — M47819 Spondylosis without myelopathy or radiculopathy, site unspecified: Secondary | ICD-10-CM | POA: Diagnosis not present

## 2014-12-09 DIAGNOSIS — G243 Spasmodic torticollis: Secondary | ICD-10-CM | POA: Diagnosis not present

## 2014-12-09 MED ORDER — OXYCODONE-ACETAMINOPHEN 7.5-325 MG PO TABS: 1.0000 | ORAL_TABLET | Freq: Four times a day (QID) | ORAL | Status: DC | PRN

## 2014-12-09 MED ORDER — FENTANYL 50 MCG/HR TD PT72: 50.0000 ug | MEDICATED_PATCH | TRANSDERMAL | Status: DC

## 2014-12-09 MED ORDER — METHOCARBAMOL 500 MG PO TABS
500.0000 mg | ORAL_TABLET | Freq: Four times a day (QID) | ORAL | Status: DC | PRN
Start: 2014-12-09 — End: 2015-02-17

## 2014-12-09 NOTE — Patient Instructions (Signed)

## 2014-12-09 NOTE — Progress Notes (Signed)
Subjective:    Patient ID: Bradley Rice, male    DOB: [birth year], 54 y.o.   MRN: [id number]  Back Pain This is a chronic problem. The current episode started more than 1 year ago. The problem occurs constantly. The problem is unchanged. The pain is present in the lumbar spine. The quality of the pain is described as aching. The pain radiates to the left thigh and right thigh. The pain is at a severity of 10/10. The pain is moderate. The symptoms are aggravated by bending, twisting and standing. Associated symptoms include leg pain, numbness, tingling and weakness. Pertinent negatives include no bladder incontinence, bowel incontinence or dysuria. Risk factors include recent trauma. He has tried analgesics, ice, muscle relaxant and NSAIDs (Pt was going to orthopedics, but insurance states he needs to "go to another place") for the symptoms. The treatment provided mild relief.      Review of Systems  HENT: Negative.   Respiratory: Negative.   Cardiovascular: Negative.   Gastrointestinal: Negative.  Negative for bowel incontinence.  Endocrine: Negative.   Genitourinary: Negative.  Negative for bladder incontinence and dysuria.  Musculoskeletal: Positive for back pain.  Neurological: Positive for tingling, weakness and numbness.  Hematological: Negative.   Psychiatric/Behavioral: Negative.   All other systems reviewed and are negative.      Objective:   Physical Exam  Constitutional: He is oriented to person, place, and time. He appears well-developed and well-nourished. No distress.  HENT:  Head: Normocephalic.  Eyes: Pupils are equal, round, and reactive to light. Right eye exhibits no discharge. Left eye exhibits no discharge.  Neck: Normal range of motion. Neck supple. No thyromegaly present.  Cardiovascular: Normal rate, regular rhythm, normal heart sounds and intact distal pulses.   No murmur heard. Pulmonary/Chest: Effort normal and breath sounds normal. No respiratory distress.  He has no wheezes.  Abdominal: Soft. Bowel sounds are normal. He exhibits no distension. There is no tenderness.  Musculoskeletal: He exhibits no edema or tenderness.  Pt walking with cane, Limited ROM of lower back with twisting and movement r/t pain   Neurological: He is alert and oriented to person, place, and time. He has normal reflexes. No cranial nerve deficit.  Skin: Skin is warm and dry. No rash noted. No erythema.  Psychiatric: He has a normal mood and affect. His behavior is normal. Judgment and thought content normal.  Vitals reviewed.     BP 126/79 mmHg  Pulse 101  Temp(Src) 97.2 F (36.2 C) (Oral)  Ht 6' (1.829 m)  Wt 172 lb 3.2 oz (78.109 kg)  BMI 23.35 kg/m2     Assessment & Plan:  1. Multilevel spondylosis - Ambulatory referral to Orthopedic Surgery - fentaNYL (DURAGESIC - DOSED MCG/HR) 50 MCG/HR; Place 1 patch (50 mcg total) onto the skin every 3 (three) days.  Dispense: 10 patch; Refill: 0 - methocarbamol (ROBAXIN) 500 MG tablet; Take 1 tablet (500 mg total) by mouth every 6 (six) hours as needed for muscle spasms.  Dispense: 90 tablet; Refill: 3  2. Cervical spondylosis with radiculopathy - Ambulatory referral to Orthopedic Surgery - fentaNYL (DURAGESIC - DOSED MCG/HR) 50 MCG/HR; Place 1 patch (50 mcg total) onto the skin every 3 (three) days.  Dispense: 10 patch; Refill: 0 - methocarbamol (ROBAXIN) 500 MG tablet; Take 1 tablet (500 mg total) by mouth every 6 (six) hours as needed for muscle spasms.  Dispense: 90 tablet; Refill: 3  3. Carpal tunnel syndrome of left wrist - fentaNYL (DURAGESIC - DOSED  MCG/HR) 50 MCG/HR; Place 1 patch (50 mcg total) onto the skin every 3 (three) days.  Dispense: 10 patch; Refill: 0 - methocarbamol (ROBAXIN) 500 MG tablet; Take 1 tablet (500 mg total) by mouth every 6 (six) hours as needed for muscle spasms.  Dispense: 90 tablet; Refill: 3  4. Cervical dystonia - fentaNYL (DURAGESIC - DOSED MCG/HR) 50 MCG/HR; Place 1 patch (50  mcg total) onto the skin every 3 (three) days.  Dispense: 10 patch; Refill: 0 - methocarbamol (ROBAXIN) 500 MG tablet; Take 1 tablet (500 mg total) by mouth every 6 (six) hours as needed for muscle spasms.  Dispense: 90 tablet; Refill: 3   Rest Continue current medications- Pt told we would write one month supply of pain medications. He will need to follow up with new orthopedic or pain clinic Follow up with Orthopedics- Possible surgery? RTO prn   Evelina Dun, FNP

## 2014-12-12 ENCOUNTER — Ambulatory Visit: Payer: 59 | Admitting: Physical Medicine & Rehabilitation

## 2014-12-22 ENCOUNTER — Telehealth: Payer: Self-pay | Admitting: Family

## 2014-12-23 ENCOUNTER — Telehealth: Payer: Self-pay

## 2014-12-23 NOTE — Telephone Encounter (Signed)
Insurance is requesting prior authorization for Cialis.  I can not find a diagnosis for this   He takes it daily    Please let me know so I can prior authorize it  Thanks

## 2014-12-23 NOTE — Telephone Encounter (Signed)
Pt takes Cialis for Erectile dysfunction

## 2014-12-25 ENCOUNTER — Telehealth: Payer: Self-pay

## 2014-12-25 NOTE — Telephone Encounter (Signed)
Insurance denied prior authorization for Cialis 5 mg  Needs to have a dx of BPH  And have hx of failure to 4 weeks trial of Cardura, Flomax, Hytrin, Rapaflo or Uroxatrol or has a contraindication to the above meds

## 2014-12-26 ENCOUNTER — Telehealth: Payer: Self-pay | Admitting: *Deleted

## 2014-12-26 NOTE — Telephone Encounter (Signed)
Bradley Rice called from [facility] about a power wheelchair eval that was initiated and some orders were signed by Bradley Rice but they are needing more information and forms signed.  I called Bradley Rice back and told her that unfortunately Bradley Rice was discharged on 11/24/14 and based on Bradley Rice note 11/14/14 that he did not think he needed a PWC.  But, because he has been discharged we can not process the order any further and will have to be addressed by his new physician.  She understands.

## 2014-12-31 ENCOUNTER — Telehealth: Payer: Self-pay

## 2014-12-31 NOTE — Telephone Encounter (Signed)
[employer] called and said patient called them about the rejection of his Cialis and [employer] asked if it could be put through as Sildenafil 5 mg

## 2014-12-31 NOTE — Telephone Encounter (Signed)
This medication does not come in 5 mg. I could write the script for [facility] for five 20 mg of Sildenafil.

## 2015-01-01 MED ORDER — SILDENAFIL CITRATE 20 MG PO TABS: ORAL_TABLET | ORAL | Status: DC

## 2015-01-01 NOTE — Telephone Encounter (Signed)
Pt aware and 20 mg sent to Aaron Edelman at the drug store - [facility].  He will match [facility] price for this med.   FYI to [place].

## 2015-01-13 ENCOUNTER — Telehealth: Payer: Self-pay

## 2015-01-13 ENCOUNTER — Other Ambulatory Visit: Payer: Self-pay | Admitting: Family

## 2015-01-13 NOTE — Telephone Encounter (Signed)
Insurance denied Sildenafil   Patient is trying to run through a Management consultant and using his insurance not [facility]

## 2015-02-17 ENCOUNTER — Ambulatory Visit (INDEPENDENT_AMBULATORY_CARE_PROVIDER_SITE_OTHER): Payer: 59 | Admitting: Family

## 2015-02-17 ENCOUNTER — Encounter: Payer: Self-pay | Admitting: Family

## 2015-02-17 VITALS — BP 105/71 | HR 109 | Temp 98.1°F | Ht 72.0 in | Wt 167.4 lb

## 2015-02-17 DIAGNOSIS — F411 Generalized anxiety disorder: Secondary | ICD-10-CM | POA: Diagnosis not present

## 2015-02-17 DIAGNOSIS — M4722 Other spondylosis with radiculopathy, cervical region: Secondary | ICD-10-CM

## 2015-02-17 DIAGNOSIS — G1221 Amyotrophic lateral sclerosis: Secondary | ICD-10-CM

## 2015-02-17 DIAGNOSIS — F32A Depression, unspecified: Secondary | ICD-10-CM

## 2015-02-17 DIAGNOSIS — F329 Major depressive disorder, single episode, unspecified: Secondary | ICD-10-CM

## 2015-02-17 DIAGNOSIS — Z720 Tobacco use: Secondary | ICD-10-CM | POA: Diagnosis not present

## 2015-02-17 DIAGNOSIS — F17200 Nicotine dependence, unspecified, uncomplicated: Secondary | ICD-10-CM | POA: Insufficient documentation

## 2015-02-17 DIAGNOSIS — G243 Spasmodic torticollis: Secondary | ICD-10-CM | POA: Diagnosis not present

## 2015-02-17 DIAGNOSIS — Z87891 Personal history of nicotine dependence: Secondary | ICD-10-CM | POA: Insufficient documentation

## 2015-02-17 DIAGNOSIS — N529 Male erectile dysfunction, unspecified: Secondary | ICD-10-CM | POA: Diagnosis not present

## 2015-02-17 DIAGNOSIS — G5602 Carpal tunnel syndrome, left upper limb: Secondary | ICD-10-CM | POA: Diagnosis not present

## 2015-02-17 DIAGNOSIS — M47819 Spondylosis without myelopathy or radiculopathy, site unspecified: Secondary | ICD-10-CM

## 2015-02-17 DIAGNOSIS — K224 Dyskinesia of esophagus: Secondary | ICD-10-CM | POA: Diagnosis not present

## 2015-02-17 MED ORDER — CLONAZEPAM 0.5 MG PO TABS: 0.5000 mg | ORAL_TABLET | Freq: Two times a day (BID) | ORAL | Status: DC | PRN

## 2015-02-17 MED ORDER — METHOCARBAMOL 500 MG PO TABS: 500.0000 mg | ORAL_TABLET | Freq: Four times a day (QID) | ORAL | Status: DC | PRN

## 2015-02-17 MED ORDER — FENTANYL 50 MCG/HR TD PT72
50.0000 ug | MEDICATED_PATCH | TRANSDERMAL | Status: DC
Start: 2015-02-17 — End: 2015-03-16

## 2015-02-17 MED ORDER — OMEPRAZOLE 20 MG PO CPDR: 20.0000 mg | DELAYED_RELEASE_CAPSULE | Freq: Every day | ORAL | Status: DC

## 2015-02-17 MED ORDER — OXYCODONE-ACETAMINOPHEN 7.5-325 MG PO TABS: 1.0000 | ORAL_TABLET | Freq: Four times a day (QID) | ORAL | Status: DC | PRN

## 2015-02-17 NOTE — Patient Instructions (Signed)
Amyotrophic Lateral Sclerosis Amyotrophic lateral sclerosis (ALS), also called Lou Gehrig disease, is a nervous system disease. ALS causes a gradual loss of the nerve cells (neurons) that move your voluntary muscles. Voluntary muscles are the muscles you can control, such as the muscles in your arms, legs, and lungs. In ALS, the loss of neurons causes the muscles to eventually stop working and waste away (atrophy). ALS does not affect the muscles that control your heart, digestive system, bladder, or bowels. ALS cannot be passed from one person to another. There is no cure for ALS, but treatment can help you live longer and improve your quality of life. CAUSES  The cause of most cases of ALS is not known. A small number of cases are caused by a gene. RISK FACTORS  Having a family history of the disease. A very small number of people with ALS have a family history of the disease. Your risk of developing ALS may be increased if you inherit genes for the disorder.  Being a man. Men are affected slightly more than women.  Being between the age of 60 and 69. Anyone at any age can get ALS, but people are at greatest risk between these ages. SIGNS AND SYMPTOMS  Signs and symptoms of ALS start slowly. The first symptoms may include:   Cramps.  Muscle twitches.  Clumsiness.  Stiffness. Over time, signs and symptoms become more noticeable. These may include:  Inability to walk.  Slurred speech.  Trouble swallowing.  Inability to move arms or legs.  Muscle atrophy.  Involuntary laughing or crying.  Trouble breathing.  Anxiety and depression. DIAGNOSIS  Your health care provider may diagnose ALS based on your symptoms and a physical exam. Your health care provider may also do some tests to help make a diagnosis. These may include:   Nerve conduction studies. These record muscle activity and check how well your muscle nerves send signals.  Genetic testing. You may have this test to  check for a genetic cause that may require genetic counseling.  Imaging studies of the brain and spinal cord, such as an MRI or CT scan. You may have these to rule out other causes for your symptoms. You may be diagnosed with ALS if your signs and symptoms get worse over time and involve both the neurons in the brain (upper neurons) and those in the spinal cord (lower neurons).  TREATMENT  There is no cure for ALS, but treatment can slow down the progression of the disease and improve your quality of life. Treatment may include:  Medicines. These may be taken to:  Reduce damage to neurons.  Reduce muscle cramps or spasms.  Relieve anxiety or depression.  Occupational and physical therapy to improve quality of life.  Speech therapy to improve speech and the ability to swallow.  Devices to help you breathe more easily. These may include:  Intermittent or positive pressure ventilation.  A diaphragm pacing system. You may need to work with a team of health care providers that includes doctors, therapists, and nutrition specialists. Together you will come up with a plan for home care to meet your needs. These needs may change over time.  HOME CARE INSTRUCTIONS  Do not smoke.  Only take medicines as directed by your health care provider.  Eat small meals often. Work with a nutrition specialist to maintain a healthy diet.  Exercise daily if you are able. Work with a physical therapist to come up with an exercise program that includes stretching   and range-of-motion exercises. Aerobic exercises like swimming or walking can help strengthen muscles that are not affected by ALS.  Make your home safe and easy for you to get around.  An occupational therapist can show you how to use ramps, braces, or a walker.  These devices can help you conserve energy.  Make sure you have a strong support system at home.  Work with a mental health caregiver if you are struggling with symptoms of  anxiety or depression.  Work with a Education officer, museum to get the support you need. Your needs may change over time. Work with your health care providers to make sure your needs are being met. SEEK MEDICAL CARE IF:  You have a fever.  You are struggling with any part of home care.  You are struggling with anxiety, depression, or lack of support at home. SEEK IMMEDIATE MEDICAL CARE IF:  You have a fever for more than 3 days.  You cannot swallow food or liquids.  You choke on foods or liquids.  You have chest pain.  You have trouble breathing. MAKE SURE YOU:  Understand these instructions.  Will watch your condition.  Will get help right away if you are not doing well or get worse. Document Released: 07/12/2001 Document Revised: 07/23/2013 Document Reviewed: 07/01/2013 [facility] Patient Information 2015 [place], [place]. This information is not intended to replace advice given to you by your health care provider. Make sure you discuss any questions you have with your health care provider.

## 2015-02-17 NOTE — Addendum Note (Signed)
Addended by: Evelina Dun A on: 02/17/2015 04:25 PM   Modules accepted: Orders

## 2015-02-17 NOTE — Progress Notes (Signed)
Subjective:    Patient ID: Bradley Rice, male    DOB: 1960/12/12, 54 y.o.   MRN: [id number]    HPI Pt presents to the office today for chronic follow up. 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 Pt states he now has a power wheel chair to his home yesterday and a walker he can use at home for weakness. Pt states he has fallen several times at home if he does not use his walker or crutches. I told pt on 12/09/14 that pt needed to follow up with Ortho or Pain Clinic for pain medication. Pt states he was never called about a pain clinic and have followed up with his Ortho, but was told he needed to go to Pain Management.    Review of Systems  Constitutional: Negative.   HENT: Negative.   Respiratory: Negative.   Cardiovascular: Negative.   Gastrointestinal: Negative.   Endocrine: Negative.   Genitourinary: Negative.   Musculoskeletal: Negative.   Neurological: Negative.   Hematological: Negative.   Psychiatric/Behavioral: Negative.   All other systems reviewed and are negative.      Objective:   Physical Exam  Constitutional: He is oriented to person, place, and time. He appears well-developed and well-nourished. No distress.  HENT:  Head: Normocephalic.  Right Ear: External ear normal.  Left Ear: External ear normal.  Nose: Nose normal.  Mouth/Throat: Oropharynx is clear and moist.  Eyes: Pupils are equal, round, and reactive to light. Right eye exhibits no discharge. Left eye exhibits no discharge.  Neck: Normal range of motion. Neck supple. No thyromegaly present.  Cardiovascular: Normal rate, regular rhythm, normal heart sounds and intact distal pulses.   No murmur heard. Pulmonary/Chest: Effort normal and breath sounds normal. No respiratory distress. He has no wheezes.  Abdominal: Soft. Bowel sounds are normal. He exhibits no distension. There is no tenderness.  Musculoskeletal: Normal range of motion. He exhibits no edema or tenderness.   Bilateral LE weakness- Pt using crutches today   Neurological: He is alert and oriented to person, place, and time. He has normal reflexes. No cranial nerve deficit.  Skin: Skin is warm and dry. No rash noted. No erythema.  Psychiatric: He has a normal mood and affect. His behavior is normal. Judgment and thought content normal.  Vitals reviewed.    BP 105/71 mmHg  Pulse 109  Temp(Src) 98.1 F (36.7 C) (Oral)  Ht 6' (1.829 m)  Wt 167 lb 6.4 oz (75.932 kg)  BMI 22.70 kg/m2      Assessment & Plan:  1. Leta Baptist disease - Ambulatory referral to [facility]  2. Erectile dysfunction, unspecified erectile dysfunction type - CMP14+EGFR  3. GAD (generalized anxiety disorder) - Ambulatory referral to Pain Clinic - CMP14+EGFR - clonazePAM (KLONOPIN) 0.5 MG tablet; Take 1 tablet (0.5 mg total) by mouth 2 (two) times daily as needed for anxiety.  Dispense: 60 tablet; Refill: 3  4. Depression - Ambulatory referral to Pain Clinic - CMP14+EGFR  5. Cervical dystonia - Ambulatory referral to Pain Clinic - CMP14+EGFR - methocarbamol (ROBAXIN) 500 MG tablet; Take 1 tablet (500 mg total) by mouth every 6 (six) hours as needed for muscle spasms.  Dispense: 90 tablet; Refill: 3 - fentaNYL (DURAGESIC - DOSED MCG/HR) 50 MCG/HR; Place 1 patch (50 mcg total) onto the skin every 3 (three) days.  Dispense: 10 patch; Refill: 0 - clonazePAM (KLONOPIN) 0.5 MG tablet; Take 1 tablet (0.5 mg total)  by mouth 2 (two) times daily as needed for anxiety.  Dispense: 60 tablet; Refill: 3  6. Cervical spondylosis with radiculopathy - Ambulatory referral to Pain Clinic - CMP14+EGFR - methocarbamol (ROBAXIN) 500 MG tablet; Take 1 tablet (500 mg total) by mouth every 6 (six) hours as needed for muscle spasms.  Dispense: 90 tablet; Refill: 3 - fentaNYL (DURAGESIC - DOSED MCG/HR) 50 MCG/HR; Place 1 patch (50 mcg total) onto the skin every 3 (three) days.  Dispense: 10 patch; Refill: 0 - clonazePAM  (KLONOPIN) 0.5 MG tablet; Take 1 tablet (0.5 mg total) by mouth 2 (two) times daily as needed for anxiety.  Dispense: 60 tablet; Refill: 3  7. Multilevel spondylosis - Ambulatory referral to Pain Clinic - CMP14+EGFR - methocarbamol (ROBAXIN) 500 MG tablet; Take 1 tablet (500 mg total) by mouth every 6 (six) hours as needed for muscle spasms.  Dispense: 90 tablet; Refill: 3 - fentaNYL (DURAGESIC - DOSED MCG/HR) 50 MCG/HR; Place 1 patch (50 mcg total) onto the skin every 3 (three) days.  Dispense: 10 patch; Refill: 0 - clonazePAM (KLONOPIN) 0.5 MG tablet; Take 1 tablet (0.5 mg total) by mouth 2 (two) times daily as needed for anxiety.  Dispense: 60 tablet; Refill: 3  8. Esophageal spasm - CMP14+EGFR - omeprazole (PRILOSEC) 20 MG capsule; Take 1 capsule (20 mg total) by mouth daily.  Dispense: 90 capsule; Refill: 3  9. Carpal tunnel syndrome of left wrist - CMP14+EGFR - methocarbamol (ROBAXIN) 500 MG tablet; Take 1 tablet (500 mg total) by mouth every 6 (six) hours as needed for muscle spasms.  Dispense: 90 tablet; Refill: 3 - fentaNYL (DURAGESIC - DOSED MCG/HR) 50 MCG/HR; Place 1 patch (50 mcg total) onto the skin every 3 (three) days.  Dispense: 10 patch; Refill: 0 - clonazePAM (KLONOPIN) 0.5 MG tablet; Take 1 tablet (0.5 mg total) by mouth 2 (two) times daily as needed for anxiety.  Dispense: 60 tablet; Refill: 3  10. Current smoker -Smoking cessation discussed   Continue all meds Labs pending Health Maintenance reviewed Diet and exercise encouraged RTO 6 months- Pt needs to follow up with Pain Clinic-I told pt I could not continue to write his pain medications  Evelina Dun, FNP

## 2015-02-18 LAB — CMP14+EGFR
A/G RATIO: 1.3 (ref 1.1–2.5)
ALT: 15 IU/L (ref 0–44)
AST: 14 IU/L (ref 0–40)
Albumin: 4.2 g/dL (ref 3.5–5.5)
Alkaline Phosphatase: 94 IU/L (ref 39–117)
BUN/Creatinine Ratio: 5 — ABNORMAL LOW (ref 9–20)
BUN: 4 mg/dL — ABNORMAL LOW (ref 6–24)
Bilirubin Total: 0.3 mg/dL (ref 0.0–1.2)
CO2: 24 mmol/L (ref 18–29)
Calcium: 9.9 mg/dL (ref 8.7–10.2)
Chloride: 98 mmol/L (ref 97–108)
Creatinine, Ser: 0.84 mg/dL (ref 0.76–1.27)
GFR calc non Af Amer: 101 mL/min/{1.73_m2} (ref >59)
GFR, EST AFRICAN AMERICAN: 116 mL/min/{1.73_m2} (ref >59)
GLUCOSE: 107 mg/dL — AB (ref 65–99)
Globulin, Total: 3.3 g/dL (ref 1.5–4.5)
Potassium: 4.6 mmol/L (ref 3.5–5.2)
SODIUM: 139 mmol/L (ref 134–144)
TOTAL PROTEIN: 7.5 g/dL (ref 6.0–8.5)

## 2015-03-16 ENCOUNTER — Telehealth: Payer: Self-pay | Admitting: *Deleted

## 2015-03-16 DIAGNOSIS — M4722 Other spondylosis with radiculopathy, cervical region: Secondary | ICD-10-CM

## 2015-03-16 DIAGNOSIS — M47819 Spondylosis without myelopathy or radiculopathy, site unspecified: Secondary | ICD-10-CM

## 2015-03-16 DIAGNOSIS — G5602 Carpal tunnel syndrome, left upper limb: Secondary | ICD-10-CM

## 2015-03-16 DIAGNOSIS — G243 Spasmodic torticollis: Secondary | ICD-10-CM

## 2015-03-16 MED ORDER — FENTANYL 50 MCG/HR TD PT72: 50.0000 ug | MEDICATED_PATCH | TRANSDERMAL | Status: DC

## 2015-03-16 NOTE — Telephone Encounter (Signed)
Rx ready for pick up. 

## 2015-03-16 NOTE — Telephone Encounter (Signed)
Pharmacy called and stated that rxs that were given at last visit percocet, klonopin, and fentanyl were all printed on same piece of rx paper. He took the rx to [facility] on Saturday and had percocet filled. His insurance is not in network with [facility] so he paid cash for the percocet but couldn't pay for others.. He has found that [facility] is in his network with his insurance and [facility] called klonopin there but because they filled the percocet they can't release the original rx back to patient. They want to know if another rx for fentanyl can be printed so he can take to [facility]. Please advise

## 2015-04-10 ENCOUNTER — Telehealth: Payer: Self-pay | Admitting: Family

## 2015-04-10 DIAGNOSIS — M47819 Spondylosis without myelopathy or radiculopathy, site unspecified: Secondary | ICD-10-CM

## 2015-04-10 DIAGNOSIS — G243 Spasmodic torticollis: Secondary | ICD-10-CM

## 2015-04-10 DIAGNOSIS — G5602 Carpal tunnel syndrome, left upper limb: Secondary | ICD-10-CM

## 2015-04-10 DIAGNOSIS — F411 Generalized anxiety disorder: Secondary | ICD-10-CM

## 2015-04-10 DIAGNOSIS — M4722 Other spondylosis with radiculopathy, cervical region: Secondary | ICD-10-CM

## 2015-04-10 DIAGNOSIS — K224 Dyskinesia of esophagus: Secondary | ICD-10-CM

## 2015-04-10 MED ORDER — CLONAZEPAM 0.5 MG PO TABS: 0.5000 mg | ORAL_TABLET | Freq: Two times a day (BID) | ORAL | Status: DC | PRN

## 2015-04-10 MED ORDER — OXYCODONE-ACETAMINOPHEN 7.5-325 MG PO TABS: 1.0000 | ORAL_TABLET | Freq: Four times a day (QID) | ORAL | Status: DC | PRN

## 2015-04-10 MED ORDER — BUPROPION HCL ER (SR) 150 MG PO TB12: 150.0000 mg | ORAL_TABLET | Freq: Two times a day (BID) | ORAL | Status: DC

## 2015-04-10 MED ORDER — FENTANYL 50 MCG/HR TD PT72: 50.0000 ug | MEDICATED_PATCH | TRANSDERMAL | Status: DC

## 2015-04-10 MED ORDER — OMEPRAZOLE 20 MG PO CPDR: 20.0000 mg | DELAYED_RELEASE_CAPSULE | Freq: Every day | ORAL | Status: DC

## 2015-04-10 NOTE — Telephone Encounter (Signed)
Pt notified of RXs 

## 2015-04-10 NOTE — Telephone Encounter (Signed)
RX ready for pick up 

## 2015-04-13 ENCOUNTER — Other Ambulatory Visit: Payer: Self-pay | Admitting: Family

## 2015-04-13 DIAGNOSIS — M4722 Other spondylosis with radiculopathy, cervical region: Secondary | ICD-10-CM

## 2015-04-13 DIAGNOSIS — G243 Spasmodic torticollis: Secondary | ICD-10-CM

## 2015-04-13 DIAGNOSIS — M47819 Spondylosis without myelopathy or radiculopathy, site unspecified: Secondary | ICD-10-CM

## 2015-04-13 DIAGNOSIS — F411 Generalized anxiety disorder: Secondary | ICD-10-CM

## 2015-04-13 DIAGNOSIS — G5602 Carpal tunnel syndrome, left upper limb: Secondary | ICD-10-CM

## 2015-04-13 MED ORDER — CLONAZEPAM 0.5 MG PO TABS
0.5000 mg | ORAL_TABLET | Freq: Two times a day (BID) | ORAL | Status: DC | PRN
Start: 2015-04-13 — End: 2015-08-24

## 2015-04-13 MED ORDER — OXYCODONE-ACETAMINOPHEN 7.5-325 MG PO TABS: 1.0000 | ORAL_TABLET | Freq: Four times a day (QID) | ORAL | Status: DC | PRN

## 2015-04-13 MED ORDER — FENTANYL 50 MCG/HR TD PT72: 50.0000 ug | MEDICATED_PATCH | TRANSDERMAL | Status: DC

## 2015-04-13 NOTE — Progress Notes (Signed)
Left message rx ready

## 2015-05-18 ENCOUNTER — Telehealth: Payer: Self-pay | Admitting: Family

## 2015-05-18 MED ORDER — SILDENAFIL CITRATE 20 MG PO TABS: ORAL_TABLET | ORAL | Status: DC

## 2015-05-18 NOTE — Telephone Encounter (Signed)
Detailed message left that rx is ready to be picked up.

## 2015-05-18 NOTE — Telephone Encounter (Signed)
RX ready for pick up 

## 2015-08-12 ENCOUNTER — Ambulatory Visit (HOSPITAL_COMMUNITY): Payer: BLUE CROSS/BLUE SHIELD | Attending: Physical Medicine and Rehabilitation

## 2015-08-12 ENCOUNTER — Encounter (HOSPITAL_COMMUNITY): Payer: Self-pay

## 2015-08-12 DIAGNOSIS — R531 Weakness: Secondary | ICD-10-CM | POA: Diagnosis not present

## 2015-08-12 DIAGNOSIS — G1221 Amyotrophic lateral sclerosis: Secondary | ICD-10-CM | POA: Diagnosis present
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
## 2015-08-24 ENCOUNTER — Ambulatory Visit (INDEPENDENT_AMBULATORY_CARE_PROVIDER_SITE_OTHER): Payer: Medicaid Other | Admitting: Family

## 2015-08-24 ENCOUNTER — Encounter: Payer: Self-pay | Admitting: Family

## 2015-08-24 VITALS — BP 123/81 | HR 120 | Temp 97.3°F | Ht 72.0 in | Wt 179.4 lb

## 2015-08-24 DIAGNOSIS — M4722 Other spondylosis with radiculopathy, cervical region: Secondary | ICD-10-CM | POA: Diagnosis not present

## 2015-08-24 DIAGNOSIS — M47819 Spondylosis without myelopathy or radiculopathy, site unspecified: Secondary | ICD-10-CM | POA: Diagnosis not present

## 2015-08-24 DIAGNOSIS — C3492 Malignant neoplasm of unspecified part of left bronchus or lung: Secondary | ICD-10-CM

## 2015-08-24 DIAGNOSIS — Z720 Tobacco use: Secondary | ICD-10-CM

## 2015-08-24 DIAGNOSIS — K224 Dyskinesia of esophagus: Secondary | ICD-10-CM | POA: Diagnosis not present

## 2015-08-24 DIAGNOSIS — F411 Generalized anxiety disorder: Secondary | ICD-10-CM | POA: Diagnosis not present

## 2015-08-24 DIAGNOSIS — N529 Male erectile dysfunction, unspecified: Secondary | ICD-10-CM | POA: Diagnosis not present

## 2015-08-24 DIAGNOSIS — Z23 Encounter for immunization: Secondary | ICD-10-CM | POA: Diagnosis not present

## 2015-08-24 DIAGNOSIS — F32A Depression, unspecified: Secondary | ICD-10-CM

## 2015-08-24 DIAGNOSIS — G5602 Carpal tunnel syndrome, left upper limb: Secondary | ICD-10-CM | POA: Diagnosis not present

## 2015-08-24 DIAGNOSIS — G459 Transient cerebral ischemic attack, unspecified: Secondary | ICD-10-CM

## 2015-08-24 DIAGNOSIS — F17200 Nicotine dependence, unspecified, uncomplicated: Secondary | ICD-10-CM

## 2015-08-24 DIAGNOSIS — G243 Spasmodic torticollis: Secondary | ICD-10-CM

## 2015-08-24 DIAGNOSIS — F329 Major depressive disorder, single episode, unspecified: Secondary | ICD-10-CM | POA: Diagnosis not present

## 2015-08-24 DIAGNOSIS — R911 Solitary pulmonary nodule: Secondary | ICD-10-CM | POA: Insufficient documentation

## 2015-08-24 MED ORDER — METHOCARBAMOL 500 MG PO TABS: 500.0000 mg | ORAL_TABLET | Freq: Four times a day (QID) | ORAL | Status: DC | PRN

## 2015-08-24 MED ORDER — CLONAZEPAM 0.5 MG PO TABS: 0.5000 mg | ORAL_TABLET | Freq: Two times a day (BID) | ORAL | Status: DC | PRN

## 2015-08-24 MED ORDER — OMEPRAZOLE 20 MG PO CPDR: 20.0000 mg | DELAYED_RELEASE_CAPSULE | Freq: Every day | ORAL | Status: DC

## 2015-08-24 MED ORDER — NORTRIPTYLINE HCL 50 MG PO CAPS: 100.0000 mg | ORAL_CAPSULE | Freq: Every day | ORAL | Status: DC

## 2015-08-24 NOTE — Progress Notes (Signed)
Subjective:    Patient ID: Bradley Rice, male    DOB: 1960-09-04, 55 y.o.   MRN: [id number]   HPI PT presents to the office today for chronic follow up. PT has Leta Baptist and was diagnosed with Left lung cancer about 4 months ago. PT states he is getting chemotherapy three times a week. Pt is followed by Pain Managment once a month.    Review of Systems  Constitutional: Negative.   HENT: Negative.   Respiratory: Negative.   Cardiovascular: Negative.   Gastrointestinal: Negative.   Endocrine: Negative.   Genitourinary: Negative.   Musculoskeletal: Negative.   Neurological: Negative.   Hematological: Negative.   Psychiatric/Behavioral: Negative.   All other systems reviewed and are negative.      Objective:   Physical Exam  Constitutional: He is oriented to person, place, and time. He appears well-developed and well-nourished. No distress.  HENT:  Head: Normocephalic.  Right Ear: External ear normal.  Left Ear: External ear normal.  Nose: Nose normal.  Mouth/Throat: Oropharynx is clear and moist.  Eyes: Pupils are equal, round, and reactive to light. Right eye exhibits no discharge. Left eye exhibits no discharge.  Neck: Normal range of motion. Neck supple. No thyromegaly present.  Cardiovascular: Normal rate, regular rhythm, normal heart sounds and intact distal pulses.   No murmur heard. Pulmonary/Chest: Effort normal and breath sounds normal. No respiratory distress. He has no wheezes.  Abdominal: Soft. Bowel sounds are normal. He exhibits no distension. There is no tenderness.  Musculoskeletal: Normal range of motion. He exhibits no edema or tenderness.  Neurological: He is alert and oriented to person, place, and time. He has normal reflexes. No cranial nerve deficit.  Skin: Skin is warm and dry. No rash noted. No erythema.  Psychiatric: He has a normal mood and affect. His behavior is normal. Judgment and thought content normal.  Vitals reviewed.   BP 123/81 mmHg   Pulse 120  Temp(Src) 97.3 F (36.3 C) (Oral)  Ht 6' (1.829 m)  Wt 179 lb 6.4 oz (81.375 kg)  BMI 24.33 kg/m2       Assessment & Plan:  1. Transient cerebral ischemia, unspecified transient cerebral ischemia type - CMP14+EGFR  2. Carpal tunnel syndrome of left wrist - clonazePAM (KLONOPIN) 0.5 MG tablet; Take 1 tablet (0.5 mg total) by mouth 2 (two) times daily as needed for anxiety.  Dispense: 60 tablet; Refill: 3 - methocarbamol (ROBAXIN) 500 MG tablet; Take 1 tablet (500 mg total) by mouth every 6 (six) hours as needed for muscle spasms.  Dispense: 90 tablet; Refill: 3 - CMP14+EGFR  3. Multilevel spondylosis - clonazePAM (KLONOPIN) 0.5 MG tablet; Take 1 tablet (0.5 mg total) by mouth 2 (two) times daily as needed for anxiety.  Dispense: 60 tablet; Refill: 3 - methocarbamol (ROBAXIN) 500 MG tablet; Take 1 tablet (500 mg total) by mouth every 6 (six) hours as needed for muscle spasms.  Dispense: 90 tablet; Refill: 3 - CMP14+EGFR  4. Erectile dysfunction, unspecified erectile dysfunction type - CMP14+EGFR  5. GAD (generalized anxiety disorder) - clonazePAM (KLONOPIN) 0.5 MG tablet; Take 1 tablet (0.5 mg total) by mouth 2 (two) times daily as needed for anxiety.  Dispense: 60 tablet; Refill: 3 - CMP14+EGFR  6. Depression - CMP14+EGFR  7. Current smoke - CMP14+EGFR  8. Cervical spondylosis with radiculopathy - clonazePAM (KLONOPIN) 0.5 MG tablet; Take 1 tablet (0.5 mg total) by mouth 2 (two) times daily as needed for anxiety.  Dispense: 60 tablet; Refill: 3 -  methocarbamol (ROBAXIN) 500 MG tablet; Take 1 tablet (500 mg total) by mouth every 6 (six) hours as needed for muscle spasms.  Dispense: 90 tablet; Refill: 3 - CMP14+EGFR  9. Cervical dystonia - clonazePAM (KLONOPIN) 0.5 MG tablet; Take 1 tablet (0.5 mg total) by mouth 2 (two) times daily as needed for anxiety.  Dispense: 60 tablet; Refill: 3 - methocarbamol (ROBAXIN) 500 MG tablet; Take 1 tablet (500 mg total) by mouth  every 6 (six) hours as needed for muscle spasms.  Dispense: 90 tablet; Refill: 3 - CMP14+EGFR  10. Esophageal spasm - omeprazole (PRILOSEC) 20 MG capsule; Take 1 capsule (20 mg total) by mouth daily.  Dispense: 90 capsule; Refill: 3 - CMP14+EGFR  11. Malignant neoplasm of left lung, unspecified part of lung ([place]) -Keep all Oncologists appts!!!  Continue all meds Labs pending Health Maintenance reviewed-Flu vaccine given today Diet and exercise encouraged RTO 37month   CEvelina Dun FNP

## 2015-08-24 NOTE — Patient Instructions (Signed)
Lung Cancer °Lung cancer occurs when abnormal cells in the lung grow out of control and form a mass (tumor). There are several types of lung cancer. The two most common types are: °· Non-small cell. In this type of lung cancer, abnormal cells are larger and grow more slowly than those of small cell lung cancer. °· Small cell. In this type of lung cancer, abnormal cells are smaller than those of non-small cell lung cancer. Small cell lung cancer gets worse faster than non-small cell lung cancer. °CAUSES  °The leading cause of lung cancer is smoking tobacco. The second leading cause is radon exposure. °RISK FACTORS °· Smoking tobacco. °· Exposure to secondhand tobacco smoke. °· Exposure to radon gas. °· Exposure to asbestos. °· Exposure to arsenic in drinking water. °· Air pollution. °· Family or personal history of lung cancer. °· Lung radiation therapy. °· Being older than 65 years. °SIGNS AND SYMPTOMS  °In the early stages, symptoms may not be present. As the cancer progresses, symptoms may include: °· A lasting cough, possibly with blood. °· Fatigue. °· Unexplained weight loss. °· Shortness of breath. °· Wheezing. °· Chest pain. °· Loss of appetite. °Symptoms of advanced lung cancer include: °· Hoarseness. °· Bone or joint pain. °· Weakness. °· Nail problems. °· Face or arm swelling. °· Paralysis of the face. °· Drooping eyelids. °DIAGNOSIS  °Lung cancer can be identified with a physical exam and with tests such as: °· A chest X-ray. °· A CT scan. °· Blood tests. °· A biopsy. °After a diagnosis is made, you will have more tests to determine the stage of the cancer. The stages of non-small cell lung cancer are: °· Stage 0, also called carcinoma in situ. At this stage, abnormal cells are found in the inner lining of your lung or lungs. °· Stage I. At this stage, abnormal cells have grown into a tumor that is no larger than 5 cm across. The cancer has entered the deeper lung tissue but has not yet entered the lymph  nodes or other parts of the body. °· Stage II. At this stage, the tumor is 7 cm across or smaller and has entered nearby lymph nodes. Or, the tumor is 5 cm across or smaller and has invaded surrounding tissue but is not found in nearby lymph nodes. There may be more than one tumor present. °· Stage III. At this stage, the tumor may be any size. There may be more than one tumor in the lungs. The cancer cells have spread to the lymph nodes and possibly to other organs. °· Stage IV. At this stage, there are tumors in both lungs and the cancer has spread to other areas of the body. °The stages of small cell lung cancer are: °· Limited. At this stage, the cancer is found only on one side of the chest. °· Extensive. At this stage, the cancer is in the lungs and in tissues on the other side of the chest. The cancer has spread to other organs or is found in the fluid between the layers of your lungs. °TREATMENT  °Depending on the type and stage of your lung cancer, you may be treated with: °· Surgery. This is done to remove a tumor. °· Radiation therapy. This treatment destroys cancer cells using X-rays or other types of radiation. °· Chemotherapy. This treatment uses medicines to destroy cancer cells. °· Targeted therapy. This treatment aims to destroy only cancer cells instead of all cells as other therapies do. °You may   also have a combination of treatments. °HOME CARE INSTRUCTIONS  °· Do not use any tobacco products. This includes cigarettes, chewing tobacco, and electronic cigarettes. If you need help quitting, ask your health care provider. °· Take medicines only as directed by your health care provider. °· Eat a healthy diet. Work with a dietitian to make sure you are getting the nutrition you need. °· Consider joining a support group or seeking counseling to help you cope with the stress of having lung cancer. °· Let your cancer specialist (oncologist) know if you are admitted to the hospital. °· Keep all follow-up  visits as directed by your health care provider. This is important. °SEEK MEDICAL CARE IF:  °· You lose weight without trying. °· You have a persistent cough and wheezing. °· You feel short of breath. °· You tire easily. °· You experience bone or joint pain. °· You have difficulty swallowing. °· You feel hoarse or notice your voice changing. °· Your pain medicine is not helping. °SEEK IMMEDIATE MEDICAL CARE IF:  °· You cough up blood. °· You have new breathing problems. °· You develop chest pain. °· You develop swelling in: °¨ One or both ankles or legs. °¨ Your face, neck, or arms. °· You are confused. °· You experience paralysis in your face or a drooping eyelid. °  °This information is not intended to replace advice given to you by your health care provider. Make sure you discuss any questions you have with your health care provider. °  °Document Released: 10/24/2000 Document Revised: 04/08/2015 Document Reviewed: 11/21/2013 °Elsevier Interactive Patient Education ©2016 [employer] ° °

## 2015-08-25 LAB — CMP14+EGFR
ALK PHOS: 112 IU/L (ref 39–117)
ALT: 18 IU/L (ref 0–44)
AST: 14 IU/L (ref 0–40)
Albumin/Globulin Ratio: 1.4 (ref 1.1–2.5)
Albumin: 4.3 g/dL (ref 3.5–5.5)
BUN / CREAT RATIO: 11 (ref 9–20)
BUN: 9 mg/dL (ref 6–24)
CALCIUM: 9.3 mg/dL (ref 8.7–10.2)
CHLORIDE: 103 mmol/L (ref 96–106)
CO2: 24 mmol/L (ref 18–29)
Creatinine, Ser: 0.84 mg/dL (ref 0.76–1.27)
GFR calc Af Amer: 116 mL/min/{1.73_m2} (ref >59)
GFR calc non Af Amer: 101 mL/min/{1.73_m2} (ref >59)
GLOBULIN, TOTAL: 3 g/dL (ref 1.5–4.5)
Glucose: 104 mg/dL — ABNORMAL HIGH (ref 65–99)
Potassium: 4.1 mmol/L (ref 3.5–5.2)
SODIUM: 142 mmol/L (ref 134–144)
TOTAL PROTEIN: 7.3 g/dL (ref 6.0–8.5)

## 2015-10-05 ENCOUNTER — Encounter (HOSPITAL_COMMUNITY): Payer: Self-pay

## 2015-12-11 DIAGNOSIS — Z029 Encounter for administrative examinations, unspecified: Secondary | ICD-10-CM

## 2015-12-22 ENCOUNTER — Ambulatory Visit (INDEPENDENT_AMBULATORY_CARE_PROVIDER_SITE_OTHER): Payer: BLUE CROSS/BLUE SHIELD | Admitting: Family Medicine

## 2015-12-22 ENCOUNTER — Encounter (INDEPENDENT_AMBULATORY_CARE_PROVIDER_SITE_OTHER): Payer: Self-pay

## 2015-12-22 ENCOUNTER — Encounter: Payer: Self-pay | Admitting: Family Medicine

## 2015-12-22 VITALS — BP 133/78 | HR 100 | Temp 97.9°F | Ht 72.0 in | Wt 175.0 lb

## 2015-12-22 DIAGNOSIS — W57XXXA Bitten or stung by nonvenomous insect and other nonvenomous arthropods, initial encounter: Secondary | ICD-10-CM | POA: Diagnosis not present

## 2015-12-22 DIAGNOSIS — R509 Fever, unspecified: Secondary | ICD-10-CM | POA: Diagnosis not present

## 2015-12-22 DIAGNOSIS — T148 Other injury of unspecified body region: Secondary | ICD-10-CM

## 2015-12-22 DIAGNOSIS — G1221 Amyotrophic lateral sclerosis: Secondary | ICD-10-CM | POA: Insufficient documentation

## 2015-12-22 MED ORDER — ALBUTEROL SULFATE HFA 108 (90 BASE) MCG/ACT IN AERS: 2.0000 | INHALATION_SPRAY | Freq: Four times a day (QID) | RESPIRATORY_TRACT | Status: DC | PRN

## 2015-12-22 MED ORDER — FLUTICASONE FUROATE-VILANTEROL 100-25 MCG/INH IN AEPB: 1.0000 | INHALATION_SPRAY | Freq: Every day | RESPIRATORY_TRACT | Status: DC

## 2015-12-22 MED ORDER — DOXYCYCLINE HYCLATE 100 MG PO TABS: 100.0000 mg | ORAL_TABLET | Freq: Two times a day (BID) | ORAL | Status: DC

## 2015-12-22 NOTE — Progress Notes (Signed)
   HPI  Patient presents today here with tick bite and fever.  He has a complex past medical history with current small cell lung cancer which he states not being treated "because there is no cure" and Lou Gehrig's syndrome.  Patient explains that he has felt unwell for several days. He had a fever up to 102 last night and states that he had confusion at that time.  He states that over the last 6-8 weeks she's had worsening respirations and shortness of breath.  Over the last week or 2 he's had central chest pain that comes and goes and hurts worse with deep inspiration.  He also explains that he's been coughing up blood for the last week.  He feels that he may have a tick related illness, he's removed 3 takes in the last 3 weeks. The last one being one day ago and the one before that being 2-3 weeks ago. They were obviously engorged, and they were on for an unknown amount of time.  PMH: Smoking status noted ROS: Per HPI  Objective: BP 133/78 mmHg  Pulse 100  Temp(Src) 97.9 F (36.6 C) (Oral)  Ht 6' (1.829 m)  Wt 175 lb (79.379 kg)  BMI 23.73 kg/m2  SpO2 96% Gen: NAD, alert, cooperative with exam HEENT: NCAT CV: RRR, good S1/S2, no murmur Resp: Fine crackles to the midlung fields around the area below her scapula bilaterally, nonlabored, cough with deep inspiration Ext: No edema, warm Neuro: Alert and oriented, No gross deficits  Assessment and plan:  # Fever, dyspnea Given recent tick bites and high fever overnight is very reasonable to go ahead and cover with doxycycline. This would also be helpful as he is having worsening pulmonary symptoms and would cover pneumonia reasonably well.  Labs were ordered, however patient left without getting them collected, I feel by mistake, not purposefully.  Considering known malignancy, hemoptysis, worsening shortness of breath, and chest pain I recommended that he go to the emergency room for evaluation likely involving a CT  angiogram of the chest to rule out pulmonary embolism. It is very possible that he is only coughing up blood because of his lung malignancy, however given his constellation of symptoms I feel it's too high of a  likelihood to not recommend evaluation. I offered emergency room transfer with the ambulance which he declines, he states that he will drive over to the emergency room.  Wells score 3.5  Given his poor long-term prognosis I have offered him hospice referral tonight, he is resistant to this currently. He clearly is not seeking any additional care for lung cancer and with Leverne Humbles syndrome would likely easily warrant hospice   Greater than 25 minutes was spent with the patient, at least half of which was spent in direct consultation with the patient.  Laroy Apple, MD [facility] 12/22/2015, 6:16 PM

## 2015-12-22 NOTE — Patient Instructions (Signed)
Great to meet you!  Start doxycycline right away, this would cover a ticke borne illness like rocky mountain spotted fever, this would also treat pneumonia  I think you should go to the emegency room, I am concerned about a pulmonary embolism  We will call with labs within 1 week

## 2015-12-23 ENCOUNTER — Emergency Department (HOSPITAL_COMMUNITY): Payer: BLUE CROSS/BLUE SHIELD

## 2015-12-23 ENCOUNTER — Telehealth: Payer: Self-pay

## 2015-12-23 ENCOUNTER — Encounter (HOSPITAL_COMMUNITY): Payer: Self-pay | Admitting: Emergency Medicine

## 2015-12-23 ENCOUNTER — Other Ambulatory Visit: Payer: Self-pay

## 2015-12-23 ENCOUNTER — Emergency Department (HOSPITAL_COMMUNITY)
Admission: EM | Admit: 2015-12-23 | Discharge: 2015-12-23 | Disposition: A | Discharge status: Home / Self Care | Payer: BLUE CROSS/BLUE SHIELD | Attending: Emergency Medicine | Admitting: Emergency Medicine

## 2015-12-23 DIAGNOSIS — R042 Hemoptysis: Secondary | ICD-10-CM | POA: Diagnosis present

## 2015-12-23 DIAGNOSIS — I2510 Atherosclerotic heart disease of native coronary artery without angina pectoris: Secondary | ICD-10-CM | POA: Diagnosis not present

## 2015-12-23 DIAGNOSIS — J449 Chronic obstructive pulmonary disease, unspecified: Secondary | ICD-10-CM | POA: Insufficient documentation

## 2015-12-23 DIAGNOSIS — F329 Major depressive disorder, single episode, unspecified: Secondary | ICD-10-CM | POA: Insufficient documentation

## 2015-12-23 DIAGNOSIS — Z8673 Personal history of transient ischemic attack (TIA), and cerebral infarction without residual deficits: Secondary | ICD-10-CM | POA: Diagnosis not present

## 2015-12-23 DIAGNOSIS — Z87891 Personal history of nicotine dependence: Secondary | ICD-10-CM | POA: Insufficient documentation

## 2015-12-23 DIAGNOSIS — Z791 Long term (current) use of non-steroidal anti-inflammatories (NSAID): Secondary | ICD-10-CM | POA: Insufficient documentation

## 2015-12-23 DIAGNOSIS — Z85118 Personal history of other malignant neoplasm of bronchus and lung: Secondary | ICD-10-CM | POA: Insufficient documentation

## 2015-12-23 DIAGNOSIS — Z79899 Other long term (current) drug therapy: Secondary | ICD-10-CM | POA: Insufficient documentation

## 2015-12-23 DIAGNOSIS — Z79891 Long term (current) use of opiate analgesic: Secondary | ICD-10-CM | POA: Insufficient documentation

## 2015-12-23 LAB — CBC WITH DIFFERENTIAL/PLATELET
BASOS ABS: 0.2 10*3/uL — AB (ref 0.0–0.1)
Basophils Relative: 2 %
EOS ABS: 0.4 10*3/uL (ref 0.0–0.7)
EOS PCT: 4 %
HCT: 41.7 % (ref 39.0–52.0)
HEMOGLOBIN: 13.9 g/dL (ref 13.0–17.0)
LYMPHS ABS: 3 10*3/uL (ref 0.7–4.0)
Lymphocytes Relative: 30 %
MCH: 31.4 pg (ref 26.0–34.0)
MCHC: 33.3 g/dL (ref 30.0–36.0)
MCV: 94.1 fL (ref 78.0–100.0)
Monocytes Absolute: 1 10*3/uL (ref 0.1–1.0)
Monocytes Relative: 10 %
NEUTROS PCT: 54 %
Neutro Abs: 5.4 10*3/uL (ref 1.7–7.7)
PLATELETS: 284 10*3/uL (ref 150–400)
RBC: 4.43 MIL/uL (ref 4.22–5.81)
RDW: 13.4 % (ref 11.5–15.5)
WBC: 9.9 10*3/uL (ref 4.0–10.5)

## 2015-12-23 LAB — HEPATIC FUNCTION PANEL
ALBUMIN: 3.8 g/dL (ref 3.5–5.0)
ALT: 12 U/L — ABNORMAL LOW (ref 17–63)
AST: 18 U/L (ref 15–41)
Alkaline Phosphatase: 76 U/L (ref 38–126)
Bilirubin, Direct: 0.1 mg/dL (ref 0.1–0.5)
Indirect Bilirubin: 0.1 mg/dL — ABNORMAL LOW (ref 0.3–0.9)
TOTAL PROTEIN: 7.4 g/dL (ref 6.5–8.1)
Total Bilirubin: 0.2 mg/dL — ABNORMAL LOW (ref 0.3–1.2)

## 2015-12-23 LAB — BASIC METABOLIC PANEL
ANION GAP: 8 (ref 5–15)
BUN: 5 mg/dL — ABNORMAL LOW (ref 6–20)
CHLORIDE: 104 mmol/L (ref 101–111)
CO2: 26 mmol/L (ref 22–32)
Calcium: 8.7 mg/dL — ABNORMAL LOW (ref 8.9–10.3)
Creatinine, Ser: 0.7 mg/dL (ref 0.61–1.24)
Glucose, Bld: 114 mg/dL — ABNORMAL HIGH (ref 65–99)
POTASSIUM: 3.7 mmol/L (ref 3.5–5.1)
SODIUM: 138 mmol/L (ref 135–145)

## 2015-12-23 LAB — I-STAT TROPONIN, ED: TROPONIN I, POC: 0 ng/mL (ref 0.00–0.08)

## 2015-12-23 MED ORDER — SODIUM CHLORIDE 0.9 % IV BOLUS (SEPSIS)
500.0000 mL | Freq: Once | INTRAVENOUS | Status: AC
  Administered 2015-12-23: 500 mL via INTRAVENOUS

## 2015-12-23 MED ORDER — IOPAMIDOL (ISOVUE-370) INJECTION 76%
100.0000 mL | Freq: Once | INTRAVENOUS | Status: AC | PRN
  Administered 2015-12-23: 100 mL via INTRAVENOUS

## 2015-12-23 MED ORDER — HYDROCODONE-ACETAMINOPHEN 5-325 MG PO TABS
1.0000 | ORAL_TABLET | Freq: Once | ORAL | Status: AC
  Administered 2015-12-23: 1 via ORAL
  Filled 2015-12-23: qty 1

## 2015-12-23 NOTE — Discharge Instructions (Signed)
Take the doxycycline and follow up with your md next week.

## 2015-12-23 NOTE — ED Notes (Cosign Needed)
Pt expressing aggrevation with amount of time he has been here in the ED. Pt states he is ready to go home. EDP made aware

## 2015-12-23 NOTE — ED Provider Notes (Signed)
CSN: [id number]     Arrival date & time 12/23/15  1509 History   First MD Initiated Contact with Patient 12/23/15 1528     Chief Complaint  Patient presents with  . Hemoptysis     (Consider location/radiation/quality/duration/timing/severity/associated sxs/prior Treatment) Patient is a 55 y.o. male presenting with cough. The history is provided by the patient (Patient states he's been coughing up blood for a month. He saw the family doctor days who wanted him to get a CAT scan to rule out pulmonary embolus. Patient has lung cancer and aml disease).  Cough Cough characteristics: Coughing up blood. Severity:  Mild Onset quality:  Gradual Timing:  Intermittent Progression:  Waxing and waning Chronicity:  New Smoker: no   Context: not animal exposure   Associated symptoms: no chest pain, no eye discharge, no headaches and no rash     Past Medical History  Diagnosis Date  . Back pain   . Spider bite 2012  . COPD (chronic obstructive pulmonary disease) ([place])   . Dysphagia     patient reports d/t surgery, has modified diet, has to tilt head to swallow   . History of blood transfusion 2014    post op bleed  . Depression   . Coronary artery disease   . Stroke ([place])     11/2013  . Migraine   . ALS (amyotrophic lateral sclerosis) ([place])   . Cancer [facility])     lung cancer   Past Surgical History  Procedure Laterality Date  . Back surgery    . Lumbar fusion  2000  . Cholecystectomy  2009  . Anterior cervical decomp/discectomy fusion  12/22/2011    Procedure: ANTERIOR CERVICAL DECOMPRESSION/DISCECTOMY FUSION 3 LEVELS;  Surgeon: Melina Schools, MD;  Location: [place];  Service: Orthopedics;  Laterality: N/A;  ACDF C5-T1  . Anterior cervical decomp/discectomy fusion  06/25/2012    Procedure: ANTERIOR CERVICAL DECOMPRESSION/DISCECTOMY FUSION 1 LEVEL/HARDWARE REMOVAL;  Surgeon: [name], MD;  Location: [place];  Service: Orthopedics;  Laterality: N/A;  Removal anterior cervical plate  C1-Y6, Explore Fusion, Left C5-6, C6-7, C7-T1 Re-do Foraminotomy, Left open carpal tunnel release with release of ulnar nerve at [facility], Posterior Cervical Fusion with lateral mass screw  . Carpal tunnel release  06/25/2012    Procedure: CARPAL TUNNEL RELEASE;  Surgeon: [name], MD;  Location: [place];  Service: Orthopedics;  Laterality: Left;  as above  . Posterior cervical fusion/foraminotomy  06/25/2012    Procedure: POSTERIOR CERVICAL FUSION/FORAMINOTOMY LEVEL 1;  Surgeon: [name], MD;  Location: [place];  Service: Orthopedics;  Laterality: N/A;  as above  . Cardiac catheterization    . Coronary angioplasty  2001    [facility]  . Posterior cervical fusion/foraminotomy N/A 12/17/2013    Procedure: REMOVAL OF POSTERIOR CERVICAL FUSION LATERAL MASS SCREWS AND RODS C5-T1, EXPLORE FUSION, LEFT SIX-SEVEN FORAMINOTOMY;  Surgeon: [name], MD;  Location: [place];  Service: Orthopedics;  Laterality: N/A;   Family History  Problem Relation Age of Onset  . Anesthesia problems Neg Hx   . ALS Mother    Social History  Substance Use Topics  . Smoking status: Former Smoker -- 1.00 packs/day for 30 years    Types: Cigarettes    Start date: 11/21/2012    Quit date: 12/11/2012  . Smokeless tobacco: Never Used  . Alcohol Use: No    Review of Systems  Constitutional: Negative for appetite change and fatigue.  HENT: Negative for congestion, ear discharge  and sinus pressure.   Eyes: Negative for discharge.  Respiratory: Positive for cough.   Cardiovascular: Negative for chest pain.  Gastrointestinal: Negative for abdominal pain and diarrhea.  Genitourinary: Negative for frequency and hematuria.  Musculoskeletal: Negative for back pain.  Skin: Negative for rash.  Neurological: Negative for seizures and headaches.  Psychiatric/Behavioral: Negative for hallucinations.      Allergies  Review of patient's allergies indicates no known allergies.  Home Medications   Prior to  Admission medications   Medication Sig Start Date End Date Taking? Authorizing Provider  albuterol (PROVENTIL HFA;VENTOLIN HFA) 108 (90 Base) MCG/ACT inhaler Inhale 2 puffs into the lungs every 6 (six) hours as needed for wheezing or shortness of breath. 12/22/15  Yes Timmothy Euler, MD  buPROPion (WELLBUTRIN SR) 150 MG 12 hr tablet Take 1 tablet (150 mg total) by mouth 2 (two) times daily. 04/10/15  Yes Sharion Balloon, FNP  clonazePAM (KLONOPIN) 0.5 MG tablet Take 1 tablet (0.5 mg total) by mouth 2 (two) times daily as needed for anxiety. 08/24/15  Yes Sharion Balloon, FNP  doxycycline (VIBRA-TABS) 100 MG tablet Take 1 tablet (100 mg total) by mouth 2 (two) times daily. 1 po bid 12/22/15  Yes Timmothy Euler, MD  fentaNYL (DURAGESIC - DOSED MCG/HR) 75 MCG/HR Place 75 mcg onto the skin every 3 (three) days.  12/23/15  Yes Historical Provider, MD  fluticasone furoate-vilanterol (BREO ELLIPTA) 100-25 MCG/INH AEPB Inhale 1 puff into the lungs daily. 12/22/15  Yes Timmothy Euler, MD  ibuprofen (ADVIL,MOTRIN) 800 MG tablet Take 800 mg by mouth every 8 (eight) hours as needed for fever or moderate pain.  08/15/15  Yes Historical Provider, MD  methocarbamol (ROBAXIN) 500 MG tablet Take 1 tablet (500 mg total) by mouth every 6 (six) hours as needed for muscle spasms. 08/24/15  Yes Sharion Balloon, FNP  nortriptyline (PAMELOR) 50 MG capsule Take 2 capsules (100 mg total) by mouth at bedtime. 08/24/15  Yes Sharion Balloon, FNP  omeprazole (PRILOSEC) 20 MG capsule Take 1 capsule (20 mg total) by mouth daily. 08/24/15  Yes Sharion Balloon, FNP  oxyCODONE-acetaminophen (PERCOCET) 7.5-325 MG tablet TAKE ONE TABLET BY MOUTH FOUR TIMES DAILY AS NEEDED FOR PAIN. 12/04/15  Yes Historical Provider, MD  sildenafil (REVATIO) 20 MG tablet Take 2-5 tabs PRN prior to sexual activity. 05/18/15  Yes Sharion Balloon, FNP  tiZANidine (ZANAFLEX) 4 MG tablet Take 4 mg by mouth at bedtime.  02/13/15  Yes Historical Provider, MD   topiramate (TOPAMAX) 50 MG tablet 2 tablets 2 (two) times daily. 12/22/15 01/22/16 Yes Historical Provider, MD   BP 124/81 mmHg  Pulse 83  Temp(Src) 98.2 F (36.8 C)  Resp 16  Ht 6' (1.829 m)  Wt 178 lb (80.74 kg)  BMI 24.14 kg/m2  SpO2 96% Physical Exam  Constitutional: He is oriented to person, place, and time. He appears well-developed.  HENT:  Head: Normocephalic.  Eyes: Conjunctivae and EOM are normal. No scleral icterus.  Neck: Neck supple. No thyromegaly present.  Cardiovascular: Normal rate and regular rhythm.  Exam reveals no gallop and no friction rub.   No murmur heard. Pulmonary/Chest: No stridor. He has no wheezes. He has no rales. He exhibits no tenderness.  Abdominal: He exhibits no distension. There is no tenderness. There is no rebound.  Musculoskeletal: Normal range of motion. He exhibits no edema.  Lymphadenopathy:    He has no cervical adenopathy.  Neurological: He is oriented to person, place,  and time. He exhibits normal muscle tone. Coordination normal.  Skin: No rash noted. No erythema.  Psychiatric: He has a normal mood and affect. His behavior is normal.    ED Course  Procedures (including critical care time) Labs Review Labs Reviewed  CBC WITH DIFFERENTIAL/PLATELET - Abnormal; Notable for the following:    Basophils Absolute 0.2 (*)    All other components within normal limits  BASIC METABOLIC PANEL - Abnormal; Notable for the following:    Glucose, Bld 114 (*)    BUN 5 (*)    Calcium 8.7 (*)    All other components within normal limits  HEPATIC FUNCTION PANEL - Abnormal; Notable for the following:    ALT 12 (*)    Total Bilirubin 0.2 (*)    Indirect Bilirubin 0.1 (*)    All other components within normal limits  Randolm Idol, ED    Imaging Review Dg Chest 2 View  12/23/2015  CLINICAL DATA:  Hemoptysis. Tachycardia. Small cell lung cancer. Evaluation for pulmonary emboli. EXAM: CHEST  2 VIEW COMPARISON:  Radiography 12/11/2013.  CT  05/11/2013. FINDINGS: Heart size is normal. Mediastinal shadows are normal. Chronic abnormal interstitial markings are again seen throughout the lungs, progressive over time. The patient is known to have paraseptal emphysema based on the previous CT. No evidence of focal mass, infiltrate, collapse or effusion. No significant bone finding. IMPRESSION: Chronically prominent interstitial markings consistent with the patient's history of emphysema, paraseptal type. Findings are worsening over time. No acute or focal overlying process. Electronically Signed   By: Nelson Chimes M.D.   On: 12/23/2015 16:29   Ct Angio Chest Pe W/cm &/or Wo Cm  12/23/2015  CLINICAL DATA:  Hemoptysis beginning yesterday. History of small cell lung cancer and [place] Cerise Gerhrig's disease. Evaluate for pulmonary embolism. EXAM: CT ANGIOGRAPHY CHEST WITH CONTRAST TECHNIQUE: Multidetector CT imaging of the chest was performed using the standard protocol during bolus administration of intravenous contrast. Multiplanar CT image reconstructions and MIPs were obtained to evaluate the vascular anatomy. CONTRAST:  100 mL Isovue 370 IV COMPARISON:  05/11/2013 FINDINGS: Lungs are adequately inflated demonstrate mild paraseptal emphysematous disease over the apices. There is bilateral peripheral subpleural cystic change worse over the lung bases in worse compared to prior exam likely progressive fibrosis. Possible mixed hazy airspace density in increased image markings at of a lower lobes as cannot exclude an acute on chronic process due to an infectious or inflammatory etiology. Several small bilateral pulmonary nodules with only slight progression compared to the prior exam. Largest nodule over the lingula measures 8 mm (previously 7 mm). One new 5 mm nodule over the right upper lobe on image number 67. No evidence of effusion. Airways are within normal. Mild cardiomegaly. Mild calcified plaque over the left anterior descending coronary artery and left main  coronary. No evidence of pulmonary embolism. Minimal calcified plaque over the thoracic aorta. 1.1 cm right hilar lymph node 1.3 cm left hilar lymph node. 1.3 cm subcarinal lymph node. Remaining mediastinal structures are unremarkable. Images through the upper abdomen demonstrate minimal calcified plaque over the abdominal aorta. Mild degenerate change of the spine. Review of the MIP images confirms the above findings. IMPRESSION: No evidence of pulmonary embolism. Apical paraseptal emphysematous disease. Changes over the peripheral mid to lower lungs likely due to fibrosis with mild interval progression. Possible superimposed acute airspace process within the lower lobes which may be due to infectious or inflammatory nature. Mild reactive mediastinal/hilar adenopathy. Several bilateral pulmonary nodules largest over  they lingula measuring 8 mm. Slight progression compared to the prior exam including 1 new 5 mm nodule over the right upper lobe. Recommend an additional followup CT 1 year. Mild cardiomegaly.  Atherosclerotic coronary artery disease. Electronically Signed   By: Marin Olp M.D.   On: 12/23/2015 19:18   I have personally reviewed and evaluated these images and lab results as part of my medical decision-making.   EKG Interpretation   Date/Time:  Wednesday Dec 23 2015 15:45:31 EDT Ventricular Rate:  97 PR Interval:  128 QRS Duration: 91 QT Interval:  354 QTC Calculation: 450 R Axis:   64 Text Interpretation:  Sinus rhythm Confirmed by Greysen Swanton  MD, Detavious Rinn [id number])  on 12/23/2015 7:51:00 PM      MDM   Final diagnoses:  Hemoptysis    Patient's lab tests were unremarkable. CT scan of the chest did not show any PE but showed possible pneumonia. Patient was given doxycycline today by his family doctor he will continue that and follow-up with family doctor next week    Milton Ferguson, MD 12/23/15 2002

## 2015-12-23 NOTE — Telephone Encounter (Signed)
Pt was advised to go to the ER.

## 2015-12-23 NOTE — Telephone Encounter (Signed)
Pt thought Dr. Wendi Snipes was going to order a CT scan for him to have done today.After consulting with Dr. Wendi Snipes, pt

## 2015-12-23 NOTE — ED Notes (Signed)
Pt sent to ED by pcp to r/o PE. Pt states he was seen in pcp office yesterday for hemoptysis and elevated heart rate. Pt has small cell left lung cancer, lou gehrig's disease.

## 2016-01-08 ENCOUNTER — Other Ambulatory Visit: Payer: Self-pay | Admitting: *Deleted

## 2016-01-08 DIAGNOSIS — M47819 Spondylosis without myelopathy or radiculopathy, site unspecified: Secondary | ICD-10-CM

## 2016-01-08 DIAGNOSIS — F411 Generalized anxiety disorder: Secondary | ICD-10-CM

## 2016-01-08 DIAGNOSIS — G5602 Carpal tunnel syndrome, left upper limb: Secondary | ICD-10-CM

## 2016-01-08 DIAGNOSIS — M4722 Other spondylosis with radiculopathy, cervical region: Secondary | ICD-10-CM

## 2016-01-08 DIAGNOSIS — G243 Spasmodic torticollis: Secondary | ICD-10-CM

## 2016-01-08 NOTE — Telephone Encounter (Signed)
He will have to ask Bradley Rice when she is back about this refill. Controlled substances typically refilled by their PCP thanks

## 2016-01-27 ENCOUNTER — Ambulatory Visit (INDEPENDENT_AMBULATORY_CARE_PROVIDER_SITE_OTHER): Payer: BLUE CROSS/BLUE SHIELD | Admitting: Physician Assistant

## 2016-01-27 ENCOUNTER — Encounter: Payer: Self-pay | Admitting: Physician Assistant

## 2016-01-27 VITALS — BP 126/81 | HR 95 | Temp 98.0°F | Ht 72.0 in | Wt 174.2 lb

## 2016-01-27 DIAGNOSIS — E162 Hypoglycemia, unspecified: Secondary | ICD-10-CM | POA: Diagnosis not present

## 2016-01-27 NOTE — Progress Notes (Signed)
Subjective:     Patient ID: Bradley Rice, male   DOB: 11-Jan-1961, 55 y.o.   MRN: [id number]  HPI Pt with a recent hx of hypoglycemia States he was on a cruise when he felt really bad His wife says he started "talking out of his head" They went to the ship MD who took a BS reading and said it was very low Pt was given and injection and sx got better Since he has returned has had similar sx on occasion Sx improved with eating   Review of Systems  Constitutional: Positive for fatigue.  HENT: Negative.   Eyes: Negative.   Respiratory: Negative.   Cardiovascular: Negative.        Objective:   Physical Exam  Constitutional: He appears well-developed and well-nourished.  Using cane for ambulation  HENT:  Mouth/Throat: Oropharyngeal exudate present.  Cardiovascular: Normal rate, regular rhythm and normal heart sounds.   Pulmonary/Chest: Effort normal and breath sounds normal.  Nursing note and vitals reviewed.      Assessment:     1. Hypoglycemia        Plan:     Reviewed habits to increase change of episodes Needs more freq smaller meals Not to have a high sugar intake If he has a episode he needs to follow sugar intake with something more substantial There is a FH of diabetes so reviewed the S/S of diabetes with him F/U with his regular provider

## 2016-01-27 NOTE — Patient Instructions (Signed)

## 2016-01-29 ENCOUNTER — Other Ambulatory Visit: Payer: Self-pay | Admitting: *Deleted

## 2016-02-01 ENCOUNTER — Telehealth: Payer: Self-pay

## 2016-02-01 MED ORDER — BUDESONIDE-FORMOTEROL FUMARATE 160-4.5 MCG/ACT IN AERO: 2.0000 | INHALATION_SPRAY | Freq: Two times a day (BID) | RESPIRATORY_TRACT | Status: DC

## 2016-02-01 NOTE — Telephone Encounter (Signed)
[facility] trying to prior authorize [facility] requires a failure of Qvar first and I dont see that on his history med list   Last saw Lorenza Evangelist PCP

## 2016-02-01 NOTE — Telephone Encounter (Signed)
Insurance denied Breo. Symbicort Prescription sent to pharmacy

## 2016-02-01 NOTE — Telephone Encounter (Signed)
Patient aware that prescription has been changed

## 2016-02-01 NOTE — Telephone Encounter (Signed)
Medicaid prior authorized Breo x 1 year  [id number]

## 2016-02-03 ENCOUNTER — Ambulatory Visit (INDEPENDENT_AMBULATORY_CARE_PROVIDER_SITE_OTHER): Payer: BLUE CROSS/BLUE SHIELD | Admitting: Family

## 2016-02-03 ENCOUNTER — Other Ambulatory Visit: Payer: Self-pay | Admitting: Family

## 2016-02-03 ENCOUNTER — Encounter: Payer: Self-pay | Admitting: Family

## 2016-02-03 VITALS — BP 129/92 | HR 101 | Temp 98.0°F | Ht 72.0 in | Wt 168.0 lb

## 2016-02-03 DIAGNOSIS — R5383 Other fatigue: Secondary | ICD-10-CM | POA: Diagnosis not present

## 2016-02-03 DIAGNOSIS — M4722 Other spondylosis with radiculopathy, cervical region: Secondary | ICD-10-CM

## 2016-02-03 DIAGNOSIS — M47819 Spondylosis without myelopathy or radiculopathy, site unspecified: Secondary | ICD-10-CM | POA: Diagnosis not present

## 2016-02-03 DIAGNOSIS — G5602 Carpal tunnel syndrome, left upper limb: Secondary | ICD-10-CM | POA: Diagnosis not present

## 2016-02-03 DIAGNOSIS — F411 Generalized anxiety disorder: Secondary | ICD-10-CM

## 2016-02-03 DIAGNOSIS — R0681 Apnea, not elsewhere classified: Secondary | ICD-10-CM

## 2016-02-03 DIAGNOSIS — E162 Hypoglycemia, unspecified: Secondary | ICD-10-CM

## 2016-02-03 DIAGNOSIS — G243 Spasmodic torticollis: Secondary | ICD-10-CM

## 2016-02-03 LAB — BAYER DCA HB A1C WAIVED: HB A1C: 5.6 % (ref <7.0)

## 2016-02-03 MED ORDER — CLONAZEPAM 0.5 MG PO TABS
0.5000 mg | ORAL_TABLET | Freq: Two times a day (BID) | ORAL | Status: DC | PRN
Start: 2016-02-03 — End: 2016-02-03

## 2016-02-03 MED ORDER — CLONAZEPAM 0.5 MG PO TABS
0.5000 mg | ORAL_TABLET | Freq: Two times a day (BID) | ORAL | Status: DC | PRN
Start: 2016-02-03 — End: 2016-06-06

## 2016-02-03 NOTE — Progress Notes (Unsigned)
Please phone in Klonipin 0.5 mg

## 2016-02-03 NOTE — Progress Notes (Signed)
   Subjective:    Patient ID: Bradley Rice, male    DOB: 1960/10/12, 55 y.o.   MRN: [id number]  HPI Pt presents to the office today for fatigue and increased urinal frequency. Pt states he has had several "hypoglycemia episodes". Pt states he gets "extreme fatigue and feels out of it" and falls asleep. Pt states his symptoms feel better after he eats. PT states he "stops breathing" at night and that he is getting scared to sleep at night. 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    Review of Systems  Constitutional: Positive for fatigue.  Cardiovascular: Negative for palpitations and leg swelling.  Neurological: Positive for dizziness and syncope.       Objective:   Physical Exam  Constitutional: He is oriented to person, place, and time. He appears well-developed and well-nourished. No distress.  HENT:  Head: Normocephalic.  Eyes: Pupils are equal, round, and reactive to light. Right eye exhibits no discharge. Left eye exhibits no discharge.  Neck: Normal range of motion. Neck supple. No thyromegaly present.  Cardiovascular: Normal rate, regular rhythm, normal heart sounds and intact distal pulses.   No murmur heard. Pulmonary/Chest: Effort normal. No respiratory distress. He has no wheezes.  Diminished breath sounds  Abdominal: Soft. Bowel sounds are normal. He exhibits no distension. There is no tenderness.  Musculoskeletal: Normal range of motion. He exhibits no edema or tenderness.  Neurological: He is alert and oriented to person, place, and time.  Skin: Skin is warm and dry. No rash noted. No erythema.  Psychiatric: He has a normal mood and affect. His behavior is normal. Judgment and thought content normal.  Vitals reviewed.   BP 129/92 mmHg  Pulse 101  Temp(Src) 98 F (36.7 C) (Oral)  Ht 6' (1.829 m)  Wt 168 lb (76.204 kg)  BMI 22.78 kg/m2       Assessment & Plan:  1. Other fatigue - Bayer DCA Hb A1c Waived - CBC with Differential/Platelet -  CMP14+EGFR  2. Hypoglycemia - Bayer DCA Hb A1c Waived - CBC with Differential/Platelet - CMP14+EGFR  3. Apnea - Ambulatory referral to Sleep Studies  Labs pending Discussed that more likely that it was not hypoglycemia because blood glucose levels have been normal. Could be related to pain medication? Will send to sleep study since pt having apnea at night RTO prn  Evelina Dun, FNP

## 2016-02-03 NOTE — Progress Notes (Signed)
rx called in

## 2016-02-03 NOTE — Patient Instructions (Signed)

## 2016-02-04 LAB — CMP14+EGFR
A/G RATIO: 1.4 (ref 1.2–2.2)
ALK PHOS: 93 IU/L (ref 39–117)
ALT: 13 IU/L (ref 0–44)
AST: 12 IU/L (ref 0–40)
Albumin: 4.6 g/dL (ref 3.5–5.5)
BILIRUBIN TOTAL: 0.3 mg/dL (ref 0.0–1.2)
BUN/Creatinine Ratio: 9 (ref 9–20)
BUN: 8 mg/dL (ref 6–24)
CHLORIDE: 102 mmol/L (ref 96–106)
CO2: 23 mmol/L (ref 18–29)
Calcium: 9.5 mg/dL (ref 8.7–10.2)
Creatinine, Ser: 0.91 mg/dL (ref 0.76–1.27)
GFR calc Af Amer: 111 mL/min/{1.73_m2} (ref >59)
GFR calc non Af Amer: 96 mL/min/{1.73_m2} (ref >59)
Globulin, Total: 3.2 g/dL (ref 1.5–4.5)
Glucose: 101 mg/dL — ABNORMAL HIGH (ref 65–99)
Potassium: 4.1 mmol/L (ref 3.5–5.2)
Sodium: 141 mmol/L (ref 134–144)
Total Protein: 7.8 g/dL (ref 6.0–8.5)

## 2016-02-04 LAB — CBC WITH DIFFERENTIAL/PLATELET
BASOS ABS: 0.2 10*3/uL (ref 0.0–0.2)
BASOS: 2 %
EOS (ABSOLUTE): 0.2 10*3/uL (ref 0.0–0.4)
Eos: 2 %
Hematocrit: 45 % (ref 37.5–51.0)
Hemoglobin: 15.1 g/dL (ref 12.6–17.7)
IMMATURE GRANULOCYTES: 1 %
Immature Grans (Abs): 0.1 10*3/uL (ref 0.0–0.1)
Lymphocytes Absolute: 3.4 10*3/uL — ABNORMAL HIGH (ref 0.7–3.1)
Lymphs: 36 %
MCH: 31.2 pg (ref 26.6–33.0)
MCHC: 33.6 g/dL (ref 31.5–35.7)
MCV: 93 fL (ref 79–97)
MONOS ABS: 1.2 10*3/uL — AB (ref 0.1–0.9)
Monocytes: 12 %
NEUTROS ABS: 4.5 10*3/uL (ref 1.4–7.0)
NEUTROS PCT: 47 %
PLATELETS: 343 10*3/uL (ref 150–379)
RBC: 4.84 x10E6/uL (ref 4.14–5.80)
RDW: 14 % (ref 12.3–15.4)
WBC: 9.5 10*3/uL (ref 3.4–10.8)

## 2016-04-18 ENCOUNTER — Emergency Department (HOSPITAL_COMMUNITY): Payer: Medicaid Other

## 2016-04-18 ENCOUNTER — Emergency Department (HOSPITAL_COMMUNITY)
Admission: EM | Admit: 2016-04-18 | Discharge: 2016-04-18 | Disposition: A | Discharge status: Home / Self Care | Payer: Medicaid Other | Attending: Emergency Medicine | Admitting: Emergency Medicine

## 2016-04-18 ENCOUNTER — Encounter (HOSPITAL_COMMUNITY): Payer: Self-pay | Admitting: Emergency Medicine

## 2016-04-18 DIAGNOSIS — S61214A Laceration without foreign body of right ring finger without damage to nail, initial encounter: Secondary | ICD-10-CM | POA: Diagnosis not present

## 2016-04-18 DIAGNOSIS — Y939 Activity, unspecified: Secondary | ICD-10-CM | POA: Diagnosis not present

## 2016-04-18 DIAGNOSIS — S61411A Laceration without foreign body of right hand, initial encounter: Secondary | ICD-10-CM

## 2016-04-18 DIAGNOSIS — I2510 Atherosclerotic heart disease of native coronary artery without angina pectoris: Secondary | ICD-10-CM | POA: Insufficient documentation

## 2016-04-18 DIAGNOSIS — Z85118 Personal history of other malignant neoplasm of bronchus and lung: Secondary | ICD-10-CM | POA: Insufficient documentation

## 2016-04-18 DIAGNOSIS — W230XXA Caught, crushed, jammed, or pinched between moving objects, initial encounter: Secondary | ICD-10-CM | POA: Diagnosis not present

## 2016-04-18 DIAGNOSIS — Y999 Unspecified external cause status: Secondary | ICD-10-CM | POA: Insufficient documentation

## 2016-04-18 DIAGNOSIS — Z79899 Other long term (current) drug therapy: Secondary | ICD-10-CM | POA: Insufficient documentation

## 2016-04-18 DIAGNOSIS — S6991XA Unspecified injury of right wrist, hand and finger(s), initial encounter: Secondary | ICD-10-CM | POA: Diagnosis present

## 2016-04-18 DIAGNOSIS — Y929 Unspecified place or not applicable: Secondary | ICD-10-CM | POA: Diagnosis not present

## 2016-04-18 DIAGNOSIS — S61210A Laceration without foreign body of right index finger without damage to nail, initial encounter: Secondary | ICD-10-CM | POA: Insufficient documentation

## 2016-04-18 DIAGNOSIS — Z87891 Personal history of nicotine dependence: Secondary | ICD-10-CM | POA: Insufficient documentation

## 2016-04-18 DIAGNOSIS — J449 Chronic obstructive pulmonary disease, unspecified: Secondary | ICD-10-CM | POA: Insufficient documentation

## 2016-04-18 LAB — BASIC METABOLIC PANEL
ANION GAP: 9 (ref 5–15)
BUN: 9 mg/dL (ref 6–20)
CHLORIDE: 108 mmol/L (ref 101–111)
CO2: 22 mmol/L (ref 22–32)
Calcium: 9 mg/dL (ref 8.9–10.3)
Creatinine, Ser: 0.88 mg/dL (ref 0.61–1.24)
GFR calc Af Amer: >60 mL/min (ref >60)
GLUCOSE: 94 mg/dL (ref 65–99)
POTASSIUM: 3.5 mmol/L (ref 3.5–5.1)
Sodium: 139 mmol/L (ref 135–145)

## 2016-04-18 LAB — CBC WITH DIFFERENTIAL/PLATELET
Basophils Absolute: 0.1 10*3/uL (ref 0.0–0.1)
Basophils Relative: 1 %
EOS ABS: 0.4 10*3/uL (ref 0.0–0.7)
Eosinophils Relative: 3 %
HEMATOCRIT: 40.7 % (ref 39.0–52.0)
Hemoglobin: 13.5 g/dL (ref 13.0–17.0)
LYMPHS ABS: 3 10*3/uL (ref 0.7–4.0)
LYMPHS PCT: 27 %
MCH: 31.2 pg (ref 26.0–34.0)
MCHC: 33.2 g/dL (ref 30.0–36.0)
MCV: 94 fL (ref 78.0–100.0)
MONOS PCT: 8 %
Monocytes Absolute: 0.9 10*3/uL (ref 0.1–1.0)
NEUTROS PCT: 60 %
Neutro Abs: 6.7 10*3/uL (ref 1.7–7.7)
Platelets: 313 10*3/uL (ref 150–400)
RBC: 4.33 MIL/uL (ref 4.22–5.81)
RDW: 13.8 % (ref 11.5–15.5)
WBC: 11 10*3/uL — AB (ref 4.0–10.5)

## 2016-04-18 MED ORDER — CEFAZOLIN SODIUM 1 G IJ SOLR: 1.0000 g | Freq: Once | INTRAMUSCULAR | Status: DC

## 2016-04-18 MED ORDER — HYDROMORPHONE HCL 1 MG/ML IJ SOLN
1.0000 mg | Freq: Once | INTRAMUSCULAR | Status: AC
  Administered 2016-04-18: 1 mg via INTRAVENOUS
  Filled 2016-04-18: qty 1

## 2016-04-18 MED ORDER — CEFAZOLIN IN D5W 1 GM/50ML IV SOLN
1.0000 g | Freq: Once | INTRAVENOUS | Status: AC
  Administered 2016-04-18: 1 g via INTRAVENOUS
  Filled 2016-04-18: qty 50

## 2016-04-18 MED ORDER — ONDANSETRON HCL 4 MG/2ML IJ SOLN
4.0000 mg | Freq: Once | INTRAMUSCULAR | Status: AC
  Administered 2016-04-18: 4 mg via INTRAVENOUS
  Filled 2016-04-18: qty 2

## 2016-04-18 MED ORDER — OXYCODONE-ACETAMINOPHEN 5-325 MG PO TABS: 1.0000 | ORAL_TABLET | ORAL | 0 refills | Status: DC | PRN

## 2016-04-18 MED ORDER — SODIUM CHLORIDE 0.9 % IV BOLUS (SEPSIS)
1000.0000 mL | Freq: Once | INTRAVENOUS | Status: AC
  Administered 2016-04-18: 1000 mL via INTRAVENOUS

## 2016-04-18 MED ORDER — SODIUM CHLORIDE 0.9 % IV BOLUS (SEPSIS): 1000.0000 mL | Freq: Once | INTRAVENOUS | Status: DC
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
## 2016-04-18 NOTE — Discharge Instructions (Signed)
Nothing to eat or drink after midnight. You may take your morning medications with a sip of water. Arrival at the [place] office at 10 AM. Elevate hand tonight. Pain medication. Can also apply ice pack for pain.

## 2016-04-18 NOTE — ED Notes (Signed)
Pt taken to xray 

## 2016-04-18 NOTE — ED Notes (Signed)
Hand in basin to soakl in NS

## 2016-04-18 NOTE — ED Notes (Signed)
Attempted to clean pt right hand. Normal Saline poured over right hand. Pt unable to tolerate cleaning for very long. Pt reports "i am getting lightheaded." vital signs stable. nad noted. Cleaning stopped. Minimal bleeding noted at this time.

## 2016-04-22 ENCOUNTER — Institutional Professional Consult (permissible substitution): Payer: BLUE CROSS/BLUE SHIELD | Admitting: Pulmonary Disease

## 2016-04-27 ENCOUNTER — Institutional Professional Consult (permissible substitution): Payer: BLUE CROSS/BLUE SHIELD | Admitting: Pulmonary Disease

## 2016-06-06 ENCOUNTER — Other Ambulatory Visit: Payer: Self-pay | Admitting: *Deleted

## 2016-06-06 DIAGNOSIS — G243 Spasmodic torticollis: Secondary | ICD-10-CM

## 2016-06-06 DIAGNOSIS — G5602 Carpal tunnel syndrome, left upper limb: Secondary | ICD-10-CM

## 2016-06-06 DIAGNOSIS — M47819 Spondylosis without myelopathy or radiculopathy, site unspecified: Secondary | ICD-10-CM

## 2016-06-06 DIAGNOSIS — M4722 Other spondylosis with radiculopathy, cervical region: Secondary | ICD-10-CM

## 2016-06-06 DIAGNOSIS — F411 Generalized anxiety disorder: Secondary | ICD-10-CM

## 2016-06-06 MED ORDER — METHOCARBAMOL 500 MG PO TABS: 500.0000 mg | ORAL_TABLET | Freq: Four times a day (QID) | ORAL | 3 refills | Status: DC | PRN

## 2016-06-06 MED ORDER — CLONAZEPAM 0.5 MG PO TABS: 0.5000 mg | ORAL_TABLET | Freq: Two times a day (BID) | ORAL | 3 refills | Status: DC | PRN

## 2016-06-06 NOTE — Telephone Encounter (Signed)
Prescription called in to [facility]

## 2016-06-10 ENCOUNTER — Other Ambulatory Visit (INDEPENDENT_AMBULATORY_CARE_PROVIDER_SITE_OTHER): Payer: Medicaid Other

## 2016-06-10 ENCOUNTER — Ambulatory Visit (INDEPENDENT_AMBULATORY_CARE_PROVIDER_SITE_OTHER): Payer: Medicaid Other | Admitting: Pulmonary Disease

## 2016-06-10 ENCOUNTER — Encounter: Payer: Self-pay | Admitting: Pulmonary Disease

## 2016-06-10 VITALS — BP 128/84 | HR 92 | Ht 72.0 in | Wt 164.0 lb

## 2016-06-10 DIAGNOSIS — J8410 Pulmonary fibrosis, unspecified: Secondary | ICD-10-CM | POA: Diagnosis not present

## 2016-06-10 LAB — CBC WITH DIFFERENTIAL/PLATELET
BASOS ABS: 0.1 10*3/uL (ref 0.0–0.1)
BASOS PCT: 0.9 % (ref 0.0–3.0)
EOS ABS: 0.1 10*3/uL (ref 0.0–0.7)
Eosinophils Relative: 1 % (ref 0.0–5.0)
HEMATOCRIT: 43.5 % (ref 39.0–52.0)
Hemoglobin: 14.6 g/dL (ref 13.0–17.0)
LYMPHS ABS: 3.3 10*3/uL (ref 0.7–4.0)
Lymphocytes Relative: 27.4 % (ref 12.0–46.0)
MCHC: 33.7 g/dL (ref 30.0–36.0)
MCV: 92.5 fl (ref 78.0–100.0)
Monocytes Absolute: 0.9 10*3/uL (ref 0.1–1.0)
Monocytes Relative: 7.9 % (ref 3.0–12.0)
NEUTROS ABS: 7.5 10*3/uL (ref 1.4–7.7)
NEUTROS PCT: 62.8 % (ref 43.0–77.0)
PLATELETS: 356 10*3/uL (ref 150.0–400.0)
RBC: 4.7 Mil/uL (ref 4.22–5.81)
RDW: 14.2 % (ref 11.5–15.5)
WBC: 11.9 10*3/uL — ABNORMAL HIGH (ref 4.0–10.5)

## 2016-06-10 LAB — COMPREHENSIVE METABOLIC PANEL
ALT: 16 U/L (ref 0–53)
AST: 14 U/L (ref 0–37)
Albumin: 4.3 g/dL (ref 3.5–5.2)
Alkaline Phosphatase: 78 U/L (ref 39–117)
BILIRUBIN TOTAL: 0.4 mg/dL (ref 0.2–1.2)
BUN: 6 mg/dL (ref 6–23)
CALCIUM: 9.5 mg/dL (ref 8.4–10.5)
CHLORIDE: 105 meq/L (ref 96–112)
CO2: 29 meq/L (ref 19–32)
Creatinine, Ser: 0.66 mg/dL (ref 0.40–1.50)
GFR: 133.88 mL/min (ref >60.00)
GLUCOSE: 90 mg/dL (ref 70–99)
Potassium: 3.8 mEq/L (ref 3.5–5.1)
Sodium: 142 mEq/L (ref 135–145)
Total Protein: 7.9 g/dL (ref 6.0–8.3)

## 2016-06-10 NOTE — Progress Notes (Signed)
Past Surgical History He  has a past surgical history that includes Back surgery; Lumbar fusion (2000); Cholecystectomy (2009); Anterior cervical decomp/discectomy fusion (12/22/2011); Anterior cervical decomp/discectomy fusion (06/25/2012); Carpal tunnel release (06/25/2012); Posterior cervical fusion/foraminotomy (06/25/2012); Cardiac catheterization; Coronary angioplasty (2001); and Posterior cervical fusion/foraminotomy (N/A, 12/17/2013).  No Known Allergies  Family History His family history includes ALS in his mother; Cancer in his mother and sister.  Social History He  reports that he quit smoking about 4 years ago. His smoking use included Cigarettes. He has a 30.00 pack-year smoking history. He has never used smokeless tobacco. He reports that he does not drink alcohol or use drugs.  Review of systems Constitutional: Positive for unexpected weight change. Negative for fever.  HENT: Negative for congestion, dental problem, ear pain, nosebleeds, postnasal drip, rhinorrhea, sinus pressure, sneezing, sore throat and trouble swallowing.   Eyes: Negative for redness and itching.  Respiratory: Positive for shortness of breath. Negative for cough, chest tightness and wheezing.   Cardiovascular: Positive for chest pain. Negative for palpitations and leg swelling.  Gastrointestinal: Negative for nausea and vomiting.  Genitourinary: Negative for dysuria.  Musculoskeletal: Negative for joint swelling.  Skin: Negative for rash.  Neurological: Positive for headaches.  Hematological: Does not bruise/bleed easily.  Psychiatric/Behavioral: Negative for dysphoric mood. The patient is not nervous/anxious.     Current Outpatient Prescriptions on File Prior to Visit  Medication Sig  . albuterol (PROVENTIL HFA;VENTOLIN HFA) 108 (90 Base) MCG/ACT inhaler Inhale 2 puffs into the lungs every 6 (six) hours as needed for wheezing or shortness of breath.  . clonazePAM (KLONOPIN) 0.5 MG tablet Take 1 tablet  (0.5 mg total) by mouth 2 (two) times daily as needed for anxiety.  . fentaNYL (DURAGESIC - DOSED MCG/HR) 75 MCG/HR Place 75 mcg onto the skin every 3 (three) days.   . fluticasone furoate-vilanterol (BREO ELLIPTA) 100-25 MCG/INH AEPB Inhale 1 puff into the lungs daily.  . methocarbamol (ROBAXIN) 500 MG tablet Take 1 tablet (500 mg total) by mouth every 6 (six) hours as needed for muscle spasms.  . nortriptyline (PAMELOR) 50 MG capsule Take 2 capsules (100 mg total) by mouth at bedtime.  Marland Kitchen omeprazole (PRILOSEC) 20 MG capsule Take 1 capsule (20 mg total) by mouth daily.  . sildenafil (REVATIO) 20 MG tablet Take 2-5 tabs PRN prior to sexual activity.  Marland Kitchen tiZANidine (ZANAFLEX) 4 MG tablet Take 4 mg by mouth at bedtime.   . topiramate (TOPAMAX) 50 MG tablet Take 50 mg by mouth 2 (two) times daily.    No current facility-administered medications on file prior to visit.     Chief Complaint  Patient presents with  . SLEEP CONSULT    Referred by Dr Lenna Gilford. Unable to sleep. Epworth Score: 9    Pulmonary tests CT angio chest 12/23/15 >> mild paraseptal emphysema, progressive fibrosis, several b/l nodules up to 8 mm  Past medical history He  has a past medical history of ALS (amyotrophic lateral sclerosis) ([place]); Back pain; Cancer [facility]); COPD (chronic obstructive pulmonary disease) ([place]); Coronary artery disease; Depression; Dysphagia; History of blood transfusion (2014); Migraine; Spider bite (2012); and Stroke [facility]).  Vital signs BP 128/84 (BP Location: Left Arm, Cuff Size: Normal)   Pulse 92   Ht 6' (1.829 m)   Wt 164 lb (74.4 kg)   SpO2 97%   BMI 22.24 kg/m   History of Present Illness Bradley Rice is a 55 y.o. male for evaluation of sleep problems.  He has noticed trouble  falling asleep and staying asleep.  He feels some of this is related to neck and back pain.  His wife has told him that he snores, and will stop breathing while asleep.  He has a hard time slowing things down when he is  trying to sleep.  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  He reports not being seen by a neurologist for past 2 years.  He also reports history of non small cell lung cancer from about 2 years ago.  He was told the ALS therapy and cancer therapy couldn't be given together, so he stopped both therapies.  He reports seeing a lung doctor about 2 years ago, but was only told that he had lung cancer but no other lung disease that he is aware of.  He had all these appointments in [place], and he thinks these were at [facility].  He goes to sleep at 10 pm.  He falls asleep after 2 hours.  He wakes up 3 or 4 times, and then has trouble falling back to sleep.  He gets out of bed at 7 am.  He feels tired in the morning.  He denies morning headache.  He does not use anything to help him fall sleep or stay awake.  He denies sleep walking, sleep talking, bruxism, or nightmares.  There is no history of restless legs.  He denies sleep hallucinations, sleep paralysis, or cataplexy.  The Epworth score is 9 out of 24.  Physical Exam:  General - No distress, thin ENT - No sinus tenderness, no oral exudate, no LAN, no thyromegaly, TM clear, pupils equal/reactive Cardiac - s1s2 regular, no murmur, pulses symmetric Chest - prolonged exhalation, basilar crackles, no wheeze Back - No focal tenderness Abd - Soft, non-tender, no organomegaly, + bowel sounds Ext - No edema Neuro - Normal strength, cranial nerves intact Skin - No rashes Psych - Normal mood, and behavior  Discussion: His overall clinical picture is quite puzzling.  He was referred for assessment of difficulty falling asleep and staying asleep.  He reports having history of amyotrophic lateral sclerosis with diagnosis being made about 2 years ago, but his clinical exam today did not show significant evidence for muscle weakness.  He also reports history of non small cell lung  cancer, again from about 2 years ago.  His CT scan from May 2017 didn't show evidence for lung cancer, but did show emphysema and changes of pulmonary fibrosis.  He reports having seen a lung doctor about 2 years ago, but was never told he had any other lung condition besides lung cancer.  I believe these other issues need to be clarified more first before addressing his sleep difficulties.  Assessment/plan:  Sleep onset and sleep maintenance insomnia. - he reports snoring, and witnessed apneas - will arrange for overnight oximetry first, and then decide if he needs in lab sleep study  Hx of tobacco abuse with paraseptal emphysema and pulmonary fibrotic changes on recent CT chest. - will arrange for PFTs, lab tests and serology - further interventions based on above test results  Reported history of ALS. - will assess lung function further with PFT - might need further assessment with neurology to clarify if he has ALS    Patient Instructions  Lab tests today Will arrange for pulmonary function testing Will arrange for overnight oxygen test  Follow up in 4 weeks with Dr. Halford Chessman or  Nurse Practitioner    Chesley Mires, M.D. Pager [phone] 06/10/2016, 2:38 PM

## 2016-06-10 NOTE — Patient Instructions (Signed)
Lab tests today Will arrange for pulmonary function testing Will arrange for overnight oxygen test  Follow up in 4 weeks with Dr. Halford Chessman or Nurse Practitioner

## 2016-06-10 NOTE — Progress Notes (Signed)
   Subjective:    Patient ID: Bradley Rice, male    DOB: 1961/04/19, 55 y.o.   MRN: [id number]  HPI    Review of Systems  Constitutional: Positive for unexpected weight change. Negative for fever.  HENT: Negative for congestion, dental problem, ear pain, nosebleeds, postnasal drip, rhinorrhea, sinus pressure, sneezing, sore throat and trouble swallowing.   Eyes: Negative for redness and itching.  Respiratory: Positive for shortness of breath. Negative for cough, chest tightness and wheezing.   Cardiovascular: Positive for chest pain. Negative for palpitations and leg swelling.  Gastrointestinal: Negative for nausea and vomiting.  Genitourinary: Negative for dysuria.  Musculoskeletal: Negative for joint swelling.  Skin: Negative for rash.  Neurological: Positive for headaches.  Hematological: Does not bruise/bleed easily.  Psychiatric/Behavioral: Negative for dysphoric mood. The patient is not nervous/anxious.        Objective:   Physical Exam        Assessment & Plan:

## 2016-06-11 LAB — ANA W/REFLEX IF POSITIVE: Anti Nuclear Antibody(ANA): NEGATIVE

## 2016-06-13 LAB — ANTI-SCLERODERMA ANTIBODY: Scleroderma (Scl-70) (ENA) Antibody, IgG: <1

## 2016-06-13 LAB — SJOGREN'S SYNDROME ANTIBODS(SSA + SSB)
SSA (RO) (ENA) ANTIBODY, IGG: NEGATIVE
SSB (La) (ENA) Antibody, IgG: <1

## 2016-06-13 LAB — RHEUMATOID FACTOR: Rhuematoid fact SerPl-aCnc: <14 IU/mL (ref <14)

## 2016-06-13 LAB — JO-1 ANTIBODY-IGG: Jo-1 Antibody, IgG: <1

## 2016-06-13 LAB — ANCA SCREEN W REFLEX TITER: ANCA Screen: NEGATIVE

## 2016-06-13 LAB — CYCLIC CITRUL PEPTIDE ANTIBODY, IGG

## 2016-06-15 ENCOUNTER — Telehealth: Payer: Self-pay | Admitting: Pulmonary Disease

## 2016-06-15 NOTE — Telephone Encounter (Signed)
CMP Latest Ref Rng & Units 06/10/2016 04/18/2016 02/03/2016  Glucose 70 - 99 mg/dL 90 94 101(H)  BUN 6 - 23 mg/dL '6 9 8  '$ Creatinine 0.40 - 1.50 mg/dL 0.66 0.88 0.91  Sodium 135 - 145 mEq/L 142 139 141  Potassium 3.5 - 5.1 mEq/L 3.8 3.5 4.1  Chloride 96 - 112 mEq/L 105 108 102  CO2 19 - 32 mEq/L '29 22 23  '$ Calcium 8.4 - 10.5 mg/dL 9.5 9.0 9.5  Total Protein 6.0 - 8.3 g/dL 7.9 - 7.8  Total Bilirubin 0.2 - 1.2 mg/dL 0.4 - 0.3  Alkaline Phos 39 - 117 U/L 78 - 93  AST 0 - 37 U/L 14 - 12  ALT 0 - 53 U/L 16 - 13    CBC Latest Ref Rng & Units 06/10/2016 04/18/2016 02/03/2016  WBC 4.0 - 10.5 K/uL 11.9(H) 11.0(H) 9.5  Hemoglobin 13.0 - 17.0 g/dL 14.6 13.5 -  Hematocrit 39.0 - 52.0 % 43.5 40.7 45.0  Platelets 150.0 - 400.0 K/uL 356.0 313 343    Serology 06/10/16 >> ANA, RF, Anti CCP, Anti Jo, SCL 70, SSa/SSb, ANCA all negative   Will have my nurse inform pt that lab tests were all normal.

## 2016-06-17 NOTE — Telephone Encounter (Signed)
Results have been explained to patient, pt expressed understanding. Nothing further needed.  

## 2016-07-08 ENCOUNTER — Ambulatory Visit (HOSPITAL_COMMUNITY): Admission: RE | Admit: 2016-07-08 | Payer: Medicaid Other | Source: Ambulatory Visit

## 2016-07-08 ENCOUNTER — Ambulatory Visit: Payer: Medicaid Other | Admitting: Acute Care

## 2016-08-25 ENCOUNTER — Ambulatory Visit (INDEPENDENT_AMBULATORY_CARE_PROVIDER_SITE_OTHER): Payer: Medicaid Other | Admitting: Family

## 2016-08-25 ENCOUNTER — Encounter: Payer: Self-pay | Admitting: Family

## 2016-08-25 ENCOUNTER — Ambulatory Visit (INDEPENDENT_AMBULATORY_CARE_PROVIDER_SITE_OTHER): Payer: Medicaid Other

## 2016-08-25 VITALS — BP 125/84 | HR 93 | Temp 98.1°F | Ht 72.0 in | Wt 170.4 lb

## 2016-08-25 DIAGNOSIS — R0781 Pleurodynia: Secondary | ICD-10-CM

## 2016-08-25 DIAGNOSIS — G1221 Amyotrophic lateral sclerosis: Secondary | ICD-10-CM

## 2016-08-25 DIAGNOSIS — S2242XA Multiple fractures of ribs, left side, initial encounter for closed fracture: Secondary | ICD-10-CM

## 2016-08-25 DIAGNOSIS — Z23 Encounter for immunization: Secondary | ICD-10-CM

## 2016-08-25 DIAGNOSIS — F331 Major depressive disorder, recurrent, moderate: Secondary | ICD-10-CM | POA: Diagnosis not present

## 2016-08-25 MED ORDER — SILDENAFIL CITRATE 20 MG PO TABS: ORAL_TABLET | ORAL | 0 refills | Status: DC

## 2016-08-25 MED ORDER — BUDESONIDE-FORMOTEROL FUMARATE 80-4.5 MCG/ACT IN AERO: 2.0000 | INHALATION_SPRAY | Freq: Two times a day (BID) | RESPIRATORY_TRACT | 3 refills | Status: DC

## 2016-08-25 MED ORDER — SILDENAFIL CITRATE 20 MG PO TABS: ORAL_TABLET | ORAL | 2 refills | Status: DC

## 2016-08-25 MED ORDER — ALBUTEROL SULFATE HFA 108 (90 BASE) MCG/ACT IN AERS: 2.0000 | INHALATION_SPRAY | Freq: Four times a day (QID) | RESPIRATORY_TRACT | 0 refills | Status: DC | PRN

## 2016-08-25 MED ORDER — ESCITALOPRAM OXALATE 10 MG PO TABS: 10.0000 mg | ORAL_TABLET | Freq: Every day | ORAL | 3 refills | Status: DC

## 2016-08-25 MED ORDER — ALBUTEROL SULFATE (2.5 MG/3ML) 0.083% IN NEBU: 2.5000 mg | INHALATION_SOLUTION | Freq: Four times a day (QID) | RESPIRATORY_TRACT | 1 refills | Status: DC | PRN

## 2016-08-25 NOTE — Addendum Note (Signed)
Addended by: Evelina Dun A on: 08/25/2016 04:07 PM   Modules accepted: Orders

## 2016-08-25 NOTE — Progress Notes (Signed)
   Subjective:    Patient ID: [name], male    DOB: [birth year], 56 y.o.   MRN: [id number]  Chest Pain    Fall  The accident occurred 12 to 24 hours ago. The fall occurred while standing. He landed on hard floor. There was no blood loss. Point of impact: left rib. Pain location: left rib. The pain is at a severity of 10/10. The pain is moderate. The symptoms are aggravated by ambulation and pressure on injury. Pertinent negatives include no bowel incontinence, hearing loss, hematuria, loss of consciousness, tingling or visual change. He has tried rest, NSAID and acetaminophen (fentanyl, zanaflex) for the symptoms. The treatment provided mild relief.  Depression         This is a new problem.  The current episode started more than 1 year ago.   The onset quality is gradual.   The problem occurs every several days.  The problem has been rapidly worsening since onset.  Associated symptoms include fatigue, helplessness, hopelessness, irritable and sad.     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  Past treatments include nothing.     Review of Systems  Constitutional: Positive for fatigue.  Cardiovascular: Positive for chest pain.  Gastrointestinal: Negative for bowel incontinence.  Genitourinary: Negative for hematuria.  Neurological: Negative for tingling and loss of consciousness.  Psychiatric/Behavioral: Positive for depression.  All other systems reviewed and are negative.      Objective:   Physical Exam  Constitutional: He is oriented to person, place, and time. He appears well-developed and well-nourished. He is irritable. No distress.  HENT:  Head: Normocephalic.  Eyes: Pupils are equal, round, and reactive to light. Right eye exhibits no discharge. Left eye exhibits no discharge.  Neck: Normal range of motion. Neck supple. No thyromegaly present.  Cardiovascular: Normal rate, regular rhythm, normal heart sounds and intact distal pulses.   No murmur heard. Pulmonary/Chest: Effort  normal and breath sounds normal. No respiratory distress. He has no wheezes.  Abdominal: Soft. Bowel sounds are normal. He exhibits no distension. There is no tenderness.  Musculoskeletal: Normal range of motion. He exhibits tenderness (left rib). He exhibits no edema.  Neurological: He is alert and oriented to person, place, and time. He has normal reflexes. No cranial nerve deficit.  Skin: Skin is warm and dry. No rash noted. No erythema.  Psychiatric: He has a normal mood and affect. His behavior is normal. Judgment and thought content normal.  Vitals reviewed.   Chest x-ray- Left fracture noted. Preliminary reading by Evelina Dun, FNP [facility]   BP 125/84   Pulse 93   Temp 98.1 F (36.7 C) (Oral)   Ht 6' (1.829 m)   Wt 170 lb 6.4 oz (77.3 kg)   BMI 23.11 kg/m      Assessment & Plan:  1. Rib pain - DG Ribs Unilateral W/Chest Left; Future  2. Closed fracture of five ribs of left side, initial encounter -Continue pain medications -Encouraged deep breath and coughing  3. Moderate episode of recurrent major depressive disorder (HCC) -Pt started on lexapro 10 mg today Stress management discussed RTO in 6 weeks - escitalopram (LEXAPRO) 10 MG tablet; Take 1 tablet (10 mg total) by mouth daily.  Dispense: 90 tablet; Refill: 3  4. Leta Baptist disease [facility])  Evelina Dun, FNP

## 2016-08-25 NOTE — Patient Instructions (Signed)
Rib Fracture A rib fracture is a break or crack in one of the bones of the ribs. The ribs are a group of long, curved bones that wrap around your chest and attach to your spine. They protect your lungs and other organs in the chest cavity. A broken or cracked rib is often painful, but most do not cause other problems. Most rib fractures heal on their own over time. However, rib fractures can be more serious if multiple ribs are broken or if broken ribs move out of place and push against other structures. What are the causes?  A direct blow to the chest. For example, this could happen during contact sports, a car accident, or a fall against a hard object.  Repetitive movements with high force, such as pitching a baseball or having severe coughing spells. What are the signs or symptoms?  Pain when you breathe in or cough.  Pain when someone presses on the injured area. How is this diagnosed? Your caregiver will perform a physical exam. Various imaging tests may be ordered to confirm the diagnosis and to look for related injuries. These tests may include a chest X-ray, computed tomography (CT), magnetic resonance imaging (MRI), or a bone scan. How is this treated? Rib fractures usually heal on their own in 1-3 months. The longer healing period is often associated with a continued cough or other aggravating activities. During the healing period, pain control is very important. Medication is usually given to control pain. Hospitalization or surgery may be needed for more severe injuries, such as those in which multiple ribs are broken or the ribs have moved out of place. Follow these instructions at home:  Avoid strenuous activity and any activities or movements that cause pain. Be careful during activities and avoid bumping the injured rib.  Gradually increase activity as directed by your caregiver.  Only take over-the-counter or prescription medications as directed by your caregiver. Do not take  other medications without asking your caregiver first.  Apply ice to the injured area for the first 1-2 days after you have been treated or as directed by your caregiver. Applying ice helps to reduce inflammation and pain.  Put ice in a plastic bag.  Place a towel between your skin and the bag.  Leave the ice on for 15-20 minutes at a time, every 2 hours while you are awake.  Perform deep breathing as directed by your caregiver. This will help prevent pneumonia, which is a common complication of a broken rib. Your caregiver may instruct you to:  Take deep breaths several times a day.  Try to cough several times a day, holding a pillow against the injured area.  Use a device called an incentive spirometer to practice deep breathing several times a day.  Drink enough fluids to keep your urine clear or pale yellow. This will help you avoid constipation.  Do not wear a rib belt or binder. These restrict breathing, which can lead to pneumonia. Get help right away if:  You have a fever.  You have difficulty breathing or shortness of breath.  You develop a continual cough, or you cough up thick or bloody sputum.  You feel sick to your stomach (nausea), throw up (vomit), or have abdominal pain.  You have worsening pain not controlled with medications. This information is not intended to replace advice given to you by your health care provider. Make sure you discuss any questions you have with your health care provider. Document Released: 07/18/2005 Document  Revised: 12/24/2015 Document Reviewed: 09/19/2012 Elsevier Interactive Patient Education  2017 [employer].

## 2016-08-26 ENCOUNTER — Telehealth: Payer: Self-pay | Admitting: Family

## 2016-08-26 DIAGNOSIS — K224 Dyskinesia of esophagus: Secondary | ICD-10-CM

## 2016-08-26 MED ORDER — OMEPRAZOLE 20 MG PO CPDR: 20.0000 mg | DELAYED_RELEASE_CAPSULE | Freq: Every day | ORAL | 3 refills | Status: DC

## 2016-08-26 NOTE — Telephone Encounter (Signed)
Patient aware that I resent omeprazole.

## 2016-09-02 ENCOUNTER — Other Ambulatory Visit: Payer: Self-pay | Admitting: *Deleted

## 2016-09-02 DIAGNOSIS — K224 Dyskinesia of esophagus: Secondary | ICD-10-CM

## 2016-09-02 MED ORDER — OMEPRAZOLE 20 MG PO CPDR: 20.0000 mg | DELAYED_RELEASE_CAPSULE | Freq: Every day | ORAL | 3 refills | Status: DC

## 2016-09-29 ENCOUNTER — Ambulatory Visit: Payer: Medicaid Other | Admitting: Family

## 2016-09-30 ENCOUNTER — Ambulatory Visit (INDEPENDENT_AMBULATORY_CARE_PROVIDER_SITE_OTHER): Payer: Medicaid Other

## 2016-09-30 ENCOUNTER — Ambulatory Visit (INDEPENDENT_AMBULATORY_CARE_PROVIDER_SITE_OTHER): Payer: Medicaid Other | Admitting: Family

## 2016-09-30 ENCOUNTER — Encounter: Payer: Self-pay | Admitting: Family

## 2016-09-30 VITALS — BP 131/88 | HR 110 | Temp 97.9°F | Ht 72.0 in | Wt 175.0 lb

## 2016-09-30 DIAGNOSIS — Z1211 Encounter for screening for malignant neoplasm of colon: Secondary | ICD-10-CM | POA: Diagnosis not present

## 2016-09-30 DIAGNOSIS — G1221 Amyotrophic lateral sclerosis: Secondary | ICD-10-CM

## 2016-09-30 DIAGNOSIS — M79641 Pain in right hand: Secondary | ICD-10-CM | POA: Diagnosis not present

## 2016-09-30 DIAGNOSIS — F411 Generalized anxiety disorder: Secondary | ICD-10-CM

## 2016-09-30 DIAGNOSIS — F17200 Nicotine dependence, unspecified, uncomplicated: Secondary | ICD-10-CM | POA: Diagnosis not present

## 2016-09-30 DIAGNOSIS — C3492 Malignant neoplasm of unspecified part of left bronchus or lung: Secondary | ICD-10-CM

## 2016-09-30 DIAGNOSIS — I10 Essential (primary) hypertension: Secondary | ICD-10-CM | POA: Diagnosis not present

## 2016-09-30 DIAGNOSIS — F331 Major depressive disorder, recurrent, moderate: Secondary | ICD-10-CM | POA: Diagnosis not present

## 2016-09-30 MED ORDER — ESCITALOPRAM OXALATE 20 MG PO TABS: 20.0000 mg | ORAL_TABLET | Freq: Every day | ORAL | 5 refills | Status: DC

## 2016-09-30 MED ORDER — METOPROLOL SUCCINATE ER 50 MG PO TB24: 50.0000 mg | ORAL_TABLET | Freq: Every day | ORAL | 1 refills | Status: DC

## 2016-09-30 NOTE — Patient Instructions (Signed)
Stress and Stress Management Stress is a normal reaction to life events. It is what you feel when life demands more than you are used to or more than you can handle. Some stress can be useful. For example, the stress reaction can help you catch the last bus of the day, study for a test, or meet a deadline at work. But stress that occurs too often or for too long can cause problems. It can affect your emotional health and interfere with relationships and normal daily activities. Too much stress can weaken your immune system and increase your risk for physical illness. If you already have a medical problem, stress can make it worse. What are the causes? All sorts of life events may cause stress. An event that causes stress for one person may not be stressful for another person. Major life events commonly cause stress. These may be positive or negative. Examples include losing your job, moving into a new home, getting married, having a baby, or losing a loved one. Less obvious life events may also cause stress, especially if they occur day after day or in combination. Examples include working long hours, driving in traffic, caring for children, being in debt, or being in a difficult relationship. What are the signs or symptoms? Stress may cause emotional symptoms including, the following:  Anxiety. This is feeling worried, afraid, on edge, overwhelmed, or out of control.  Anger. This is feeling irritated or impatient.  Depression. This is feeling sad, down, helpless, or guilty.  Difficulty focusing, remembering, or making decisions. Stress may cause physical symptoms, including the following:  Aches and pains. These may affect your head, neck, back, stomach, or other areas of your body.  Tight muscles or clenched jaw.  Low energy or trouble sleeping. Stress may cause unhealthy behaviors, including the following:  Eating to feel better (overeating) or skipping meals.  Sleeping too little, too  much, or both.  Working too much or putting off tasks (procrastination).  Smoking, drinking alcohol, or using drugs to feel better. How is this diagnosed? Stress is diagnosed through an assessment by your health care provider. Your health care provider will ask questions about your symptoms and any stressful life events.Your health care provider will also ask about your medical history and may order blood tests or other tests. Certain medical conditions and medicine can cause physical symptoms similar to stress. Mental illness can cause emotional symptoms and unhealthy behaviors similar to stress. Your health care provider may refer you to a mental health professional for further evaluation. How is this treated? Stress management is the recommended treatment for stress.The goals of stress management are reducing stressful life events and coping with stress in healthy ways. Techniques for reducing stressful life events include the following:  Stress identification. Self-monitor for stress and identify what causes stress for you. These skills may help you to avoid some stressful events.  Time management. Set your priorities, keep a calendar of events, and learn to say "no." These tools can help you avoid making too many commitments. Techniques for coping with stress include the following:  Rethinking the problem. Try to think realistically about stressful events rather than ignoring them or overreacting. Try to find the positives in a stressful situation rather than focusing on the negatives.  Exercise. Physical exercise can release both physical and emotional tension. The key is to find a form of exercise you enjoy and do it regularly.  Relaxation techniques. These relax the body and mind. Examples include yoga,  meditation, tai chi, biofeedback, deep breathing, progressive muscle relaxation, listening to music, being out in nature, journaling, and other hobbies. Again, the key is to find one or  more that you enjoy and can do regularly.  Healthy lifestyle. Eat a balanced diet, get plenty of sleep, and do not smoke. Avoid using alcohol or drugs to relax.  Strong support network. Spend time with family, friends, or other people you enjoy being around.Express your feelings and talk things over with someone you trust. Counseling or talktherapy with a mental health professional may be helpful if you are having difficulty managing stress on your own. Medicine is typically not recommended for the treatment of stress.Talk to your health care provider if you think you need medicine for symptoms of stress. Follow these instructions at home:  Keep all follow-up visits as directed by your health care provider.  Take all medicines as directed by your health care provider. Contact a health care provider if:  Your symptoms get worse or you start having new symptoms.  You feel overwhelmed by your problems and can no longer manage them on your own. Get help right away if:  You feel like hurting yourself or someone else. This information is not intended to replace advice given to you by your health care provider. Make sure you discuss any questions you have with your health care provider. Document Released: 01/11/2001 Document Revised: 12/24/2015 Document Reviewed: 03/12/2013 Elsevier Interactive Patient Education  2017 [employer].

## 2016-09-30 NOTE — Progress Notes (Signed)
Subjective:    Patient ID: Bradley Rice, male    DOB: Dec 12, 1960, 56 y.o.   MRN: [id number]  PT presents to the office today for chronic follow up. PT has Leta Baptist and was diagnosed with Left lung cancer. PT going to start radiation in a few weeks. Pt is followed by Pain Managment once a month. Pt reports his depression is worse. PT was suppose to have surgery on his right hand, but pt states he was "put asleep, but the doctor did not do the surgery". PT is unsure why the surgery was not completed. PT's hand has increase pain and swelling.  Depression         This is a chronic problem.  The current episode started more than 1 year ago.   The onset quality is gradual.   The problem occurs every several days.  The problem has been gradually worsening since onset.  Associated symptoms include helplessness, hopelessness, irritable, restlessness, decreased interest and sad.  Associated symptoms include no headaches.  Past treatments include SSRIs - Selective serotonin reuptake inhibitors.  Past medical history includes chronic pain.   Hypertension  This is a chronic problem. The current episode started more than 1 year ago. The problem has been resolved since onset. The problem is controlled. Pertinent negatives include no headaches, peripheral edema or shortness of breath. Risk factors for coronary artery disease include male gender and sedentary lifestyle. The current treatment provides moderate improvement. There is no history of kidney disease, CAD/MI, CVA or heart failure.      Review of Systems  Constitutional: Negative.   HENT: Negative.   Respiratory: Negative.  Negative for shortness of breath.   Cardiovascular: Negative.   Gastrointestinal: Negative.   Endocrine: Negative.   Genitourinary: Negative.   Musculoskeletal: Negative.   Neurological: Negative.  Negative for headaches.  Hematological: Negative.   Psychiatric/Behavioral: Positive for depression.  All other systems reviewed  and are negative.      Objective:   Physical Exam  Constitutional: He is oriented to person, place, and time. He appears well-developed and well-nourished. He is irritable. No distress.  HENT:  Head: Normocephalic.  Right Ear: External ear normal.  Left Ear: External ear normal.  Nose: Nose normal.  Mouth/Throat: Oropharynx is clear and moist.  Eyes: Pupils are equal, round, and reactive to light. Right eye exhibits no discharge. Left eye exhibits no discharge.  Neck: Normal range of motion. Neck supple. No thyromegaly present.  Cardiovascular: Normal rate, regular rhythm, normal heart sounds and intact distal pulses.   No murmur heard. Pulmonary/Chest: Effort normal. No respiratory distress. He has decreased breath sounds. He has no wheezes.  Abdominal: Soft. Bowel sounds are normal. He exhibits no distension. There is no tenderness.  Musculoskeletal: He exhibits edema and tenderness.  Decrease ROM of lower back with flexion, extension, and rotation  Unable to move pinky and ring finger of right hand, moderate swelling in right hand   Neurological: He is alert and oriented to person, place, and time. No cranial nerve deficit.  Skin: Skin is warm and dry. No rash noted. No erythema.  Psychiatric: He has a normal mood and affect. His behavior is normal. Judgment and thought content normal.  Vitals reviewed.  Right hand- No fracture noted Preliminary reading by Evelina Dun, FNP [facility]   BP 131/88   Pulse (!) 110   Temp 97.9 F (36.6 C) (Oral)   Ht 6' (1.829 m)   Wt 175 lb (79.4 kg)  BMI 23.73 kg/m      Assessment & Plan:  1. Malignant neoplasm of left lung, unspecified part of lung ([place]) - CMP14+EGFR - CBC with Differential/Platelet  2. ALS (amyotrophic lateral sclerosis) (HCC) - CMP14+EGFR - CBC with Differential/Platelet  3. Leta Baptist disease [facility]) - CMP14+EGFR - CBC with Differential/Platelet  4. Current smoker - CMP14+EGFR - CBC with  Differential/Platelet  5. Moderate episode of recurrent major depressive disorder (HCC) -Lexapro increased to 20 mg today -Stress management discussed Pt has behavioral health appt next week - CMP14+EGFR - escitalopram (LEXAPRO) 20 MG tablet; Take 1 tablet (20 mg total) by mouth daily.  Dispense: 30 tablet; Refill: 5 - CBC with Differential/Platelet  6. GAD (generalized anxiety disorder) -Lexapro increased to 20 mg today -Stress management discussed Pt has behavioral health appt next week - CMP14+EGFR - escitalopram (LEXAPRO) 20 MG tablet; Take 1 tablet (20 mg total) by mouth daily.  Dispense: 30 tablet; Refill: 5 - CBC with Differential/Platelet  7. Essential hypertension -Metoprolol reordered today Tachycardia today-Pt has been several days without medication - CMP14+EGFR - metoprolol succinate (TOPROL-XL) 50 MG 24 hr tablet; Take 1 tablet (50 mg total) by mouth daily.  Dispense: 90 tablet; Refill: 1 - CBC with Differential/Platelet  8. Colon cancer screening - Fecal occult blood, imunochemical; Future - CBC with Differential/Platelet  9. Right hand pain -Keep appt with specialists  - DG Hand Complete Right; Future - CBC with Differential/Platelet   Continue all meds keep all appts with specialists  Labs pending Health Maintenance reviewed Diet and exercise encouraged RTO 6 weeks   Evelina Dun, FNP

## 2016-10-01 LAB — CMP14+EGFR
A/G RATIO: 1.4 (ref 1.2–2.2)
ALT: 13 IU/L (ref 0–44)
AST: 14 IU/L (ref 0–40)
Albumin: 4.4 g/dL (ref 3.5–5.5)
Alkaline Phosphatase: 95 IU/L (ref 39–117)
BUN/Creatinine Ratio: 14 (ref 9–20)
BUN: 11 mg/dL (ref 6–24)
Bilirubin Total: 0.2 mg/dL (ref 0.0–1.2)
CALCIUM: 9.5 mg/dL (ref 8.7–10.2)
CO2: 25 mmol/L (ref 18–29)
Chloride: 101 mmol/L (ref 96–106)
Creatinine, Ser: 0.79 mg/dL (ref 0.76–1.27)
GFR calc Af Amer: 119 mL/min/{1.73_m2} (ref >59)
GFR, EST NON AFRICAN AMERICAN: 103 mL/min/{1.73_m2} (ref >59)
Globulin, Total: 3.1 g/dL (ref 1.5–4.5)
Glucose: 95 mg/dL (ref 65–99)
POTASSIUM: 5.1 mmol/L (ref 3.5–5.2)
Sodium: 141 mmol/L (ref 134–144)
Total Protein: 7.5 g/dL (ref 6.0–8.5)

## 2016-10-01 LAB — CBC WITH DIFFERENTIAL/PLATELET
BASOS ABS: 0.1 10*3/uL (ref 0.0–0.2)
BASOS: 1 %
EOS (ABSOLUTE): 0.2 10*3/uL (ref 0.0–0.4)
Eos: 2 %
Hematocrit: 45.6 % (ref 37.5–51.0)
Hemoglobin: 15.3 g/dL (ref 13.0–17.7)
IMMATURE GRANS (ABS): 0.2 10*3/uL — AB (ref 0.0–0.1)
IMMATURE GRANULOCYTES: 2 %
LYMPHS: 28 %
Lymphocytes Absolute: 3.2 10*3/uL — ABNORMAL HIGH (ref 0.7–3.1)
MCH: 31.5 pg (ref 26.6–33.0)
MCHC: 33.6 g/dL (ref 31.5–35.7)
MCV: 94 fL (ref 79–97)
Monocytes Absolute: 0.7 10*3/uL (ref 0.1–0.9)
Monocytes: 6 %
NEUTROS PCT: 61 %
Neutrophils Absolute: 6.9 10*3/uL (ref 1.4–7.0)
PLATELETS: 287 10*3/uL (ref 150–379)
RBC: 4.86 x10E6/uL (ref 4.14–5.80)
RDW: 14 % (ref 12.3–15.4)
WBC: 11.2 10*3/uL — AB (ref 3.4–10.8)

## 2016-10-17 ENCOUNTER — Other Ambulatory Visit: Payer: Self-pay | Admitting: *Deleted

## 2016-10-17 MED ORDER — ALBUTEROL SULFATE (2.5 MG/3ML) 0.083% IN NEBU: 2.5000 mg | INHALATION_SOLUTION | Freq: Four times a day (QID) | RESPIRATORY_TRACT | 1 refills | Status: DC | PRN

## 2016-10-24 ENCOUNTER — Other Ambulatory Visit: Payer: Self-pay | Admitting: Family

## 2016-10-24 DIAGNOSIS — Q8789 Other specified congenital malformation syndromes, not elsewhere classified: Secondary | ICD-10-CM

## 2016-10-24 DIAGNOSIS — M79641 Pain in right hand: Secondary | ICD-10-CM

## 2016-10-24 NOTE — Progress Notes (Signed)
Referral to Orthopedic Surgery placed for right hand pain and pain syndrome

## 2016-10-25 ENCOUNTER — Telehealth: Payer: Self-pay

## 2016-10-25 NOTE — Telephone Encounter (Signed)
Referral was placed yesterday. His ortho doctor sent over office notes and I placed the order on 10/24/16

## 2016-10-25 NOTE — Telephone Encounter (Signed)
Patient aware.

## 2016-11-17 ENCOUNTER — Other Ambulatory Visit: Payer: Self-pay | Admitting: *Deleted

## 2016-11-17 MED ORDER — ALBUTEROL SULFATE HFA 108 (90 BASE) MCG/ACT IN AERS: 2.0000 | INHALATION_SPRAY | Freq: Four times a day (QID) | RESPIRATORY_TRACT | 5 refills | Status: DC | PRN

## 2016-11-25 ENCOUNTER — Other Ambulatory Visit: Payer: Self-pay | Admitting: *Deleted

## 2016-11-25 DIAGNOSIS — M4722 Other spondylosis with radiculopathy, cervical region: Secondary | ICD-10-CM

## 2016-11-25 DIAGNOSIS — F411 Generalized anxiety disorder: Secondary | ICD-10-CM

## 2016-11-25 DIAGNOSIS — G5602 Carpal tunnel syndrome, left upper limb: Secondary | ICD-10-CM

## 2016-11-25 DIAGNOSIS — G243 Spasmodic torticollis: Secondary | ICD-10-CM

## 2016-11-25 DIAGNOSIS — M47819 Spondylosis without myelopathy or radiculopathy, site unspecified: Secondary | ICD-10-CM

## 2016-11-28 DIAGNOSIS — M24541 Contracture, right hand: Secondary | ICD-10-CM | POA: Diagnosis not present

## 2016-11-28 DIAGNOSIS — M19041 Primary osteoarthritis, right hand: Secondary | ICD-10-CM | POA: Diagnosis not present

## 2016-11-28 MED ORDER — CLONAZEPAM 0.5 MG PO TABS: 0.5000 mg | ORAL_TABLET | Freq: Two times a day (BID) | ORAL | 3 refills | Status: DC | PRN

## 2017-03-17 ENCOUNTER — Ambulatory Visit (INDEPENDENT_AMBULATORY_CARE_PROVIDER_SITE_OTHER): Payer: Medicaid Other | Admitting: Family

## 2017-03-17 ENCOUNTER — Encounter: Payer: Self-pay | Admitting: Family

## 2017-03-17 ENCOUNTER — Ambulatory Visit (INDEPENDENT_AMBULATORY_CARE_PROVIDER_SITE_OTHER): Payer: Medicaid Other

## 2017-03-17 VITALS — BP 115/82 | HR 109 | Temp 97.8°F | Ht 72.0 in | Wt 160.2 lb

## 2017-03-17 DIAGNOSIS — Y92009 Unspecified place in unspecified non-institutional (private) residence as the place of occurrence of the external cause: Secondary | ICD-10-CM

## 2017-03-17 DIAGNOSIS — M25552 Pain in left hip: Secondary | ICD-10-CM | POA: Diagnosis not present

## 2017-03-17 DIAGNOSIS — R5383 Other fatigue: Secondary | ICD-10-CM

## 2017-03-17 DIAGNOSIS — C3492 Malignant neoplasm of unspecified part of left bronchus or lung: Secondary | ICD-10-CM | POA: Diagnosis not present

## 2017-03-17 DIAGNOSIS — W19XXXA Unspecified fall, initial encounter: Secondary | ICD-10-CM

## 2017-03-17 DIAGNOSIS — G1221 Amyotrophic lateral sclerosis: Secondary | ICD-10-CM | POA: Diagnosis not present

## 2017-03-17 NOTE — Patient Instructions (Signed)
  Chronic Fatigue Syndrome Chronic fatigue syndrome (CFS) is a condition that causes extreme tiredness (fatigue). This fatigue does not improve with rest, and it gets worse with physical or mental activity. You may have several other symptoms along with fatigue. Symptoms may come and go, but they generally last for months. Sometimes, CFS gets better over time, but it can be a lifelong condition. There is no cure, but there are many possible treatments. You will need to work with your health care providers to find a treatment plan that works best for you. What are the causes? The cause of CFS is not known. There may be more than one cause. Possible causes include:  An infection.  An abnormal body defense system (immune system).  Low blood pressure.  Poor diet.  Physical or emotional stress.  What increases the risk? You are more likely to develop this condition if:  You are male.  You are 40?54 years old.  You have a family history of CFS.  You live with a lot of emotional stress.  What are the signs or symptoms? The main symptom of CFS is fatigue that is severe enough to interfere with day-to-day activities. This fatigue does not get better with rest, and it gets worse with physical or mental activity. There are eight other major symptoms of CFS:  Lack of energy (malaise) that lasts more than 24 hours after physical exertion.  Sleep that does not relieve fatigue (unrefreshing sleep).  Short-term memory loss or confusion.  Joint pain without redness or swelling.  Muscle aches.  Headaches.  Painful and swollen glands (lymph nodes) in the neck or under the arms.  Sore throat.  You may also have:  Abdominal cramps, constipation, or diarrhea (irritable bowel).  Chills.  Night sweats.  Vision changes.  Dizziness.  Mental confusion (brain fog).  Clumsiness.  Sensitivity to food, noise, or odors.  Mood swings, depression, or anxiety attacks.  How is  this diagnosed? There are no tests that can diagnose this condition. Your health care provider will make the diagnosis based on your medical history, a physical exam, and a mental health exam. However, it is important to make sure that your symptoms are not caused by another medical condition. You may have lab tests or X-rays to rule out other conditions. For your health care provider to diagnose CFS:  You must have had fatigue for at least 6 straight months.  Fatigue must be your first symptom, and it must be severe enough to interfere with day-to-day activities.  There must be no other cause found for the fatigue.  You must also have at least four of the eight other major symptoms of CFS.  How is this treated? There is no cure for CFS. The condition affects everyone differently. You will need to work with your team of health care providers to find the best treatments for your symptoms. Your team may include your primary care provider, physical and exercise therapists, and mental health therapists. Treatment may include:  Improving sleep with a regular bedtime routine.  Avoiding caffeine, alcohol, and tobacco.  Doing light exercise and stretching during the day.  Taking medicines to help you sleep or to relieve joint or muscle pain.  Learning and practicing relaxation techniques.  Using memory aids or doing brainteasers to improve memory and concentration.  Seeing a mental health therapist to evaluate and treat depression, if necessary.  Trying massage therapy, acupuncture, and movement exercises, such as yoga or tai chi.  Follow   at home:  Activity  Exercise regularly, as told by your health care provider.  Avoid fatigue by pacing yourself during the day and getting enough sleep at night.  Go to bed and get up at the same time every day. Eating and drinking  Avoid caffeine and alcohol.  Avoid heavy meals in the evening.  Eat a well-balanced diet. General  instructions  Take over-the-counter and prescription medicines only as told by your health care provider.  Do not use herbal or dietary supplements unless they are approved by your health care provider.  Maintain a healthy weight.  Avoid stress and use stress-reducing techniques that you learn in therapy.  Do not use any products that contain nicotine or tobacco, such as cigarettes and e-cigarettes. If you need help quitting, ask your health care provider.  Consider joining a CFS support group.  Keep all follow-up visits as told by your health care provider. This is important. Contact a health care provider if:  Your symptoms do not get better or they get worse.  You feel angry, guilty, anxious, or depressed. This information is not intended to replace advice given to you by your health care provider. Make sure you discuss any questions you have with your health care provider. Document Released: 08/25/2004 Document Revised: 03/24/2016 Document Reviewed: 10/26/2015 Elsevier Interactive Patient Education  [employer].

## 2017-03-17 NOTE — Progress Notes (Signed)
   Subjective:    Patient ID: Bradley Rice, male    DOB: Jul 14, 1961, 56 y.o.   MRN: [id number]  PT presents to the office today for fatigue. Pt is currently being treated for ALS, Non-small cell lung cancer, and history of cervical and lumbar radiculopathy.   PT is followed by Pain Clinic every 2 months. Pt received radiation about 4 months, but states his nodule increased in size and stopped.  Fall  The accident occurred 2 days ago. The fall occurred while standing. He landed on concrete. There was no blood loss. The point of impact was the left hip. The pain is present in the left hip. The pain is at a severity of 10/10. The pain is moderate. The symptoms are aggravated by standing and pressure on injury. Pertinent negatives include no hearing loss, hematuria or loss of consciousness. He has tried acetaminophen, rest and NSAID for the symptoms. The treatment provided no relief.      Review of Systems  Constitutional: Positive for fatigue.  Genitourinary: Negative for hematuria.  Neurological: Negative for loss of consciousness.  All other systems reviewed and are negative.      Objective:   Physical Exam  Constitutional: He is oriented to person, place, and time. He appears well-developed and well-nourished. No distress.  HENT:  Head: Normocephalic.  Right Ear: External ear normal.  Left Ear: External ear normal.  Nose: Nose normal.  Mouth/Throat: Oropharynx is clear and moist.  Eyes: Pupils are equal, round, and reactive to light. Right eye exhibits no discharge. Left eye exhibits no discharge.  Neck: Normal range of motion. Neck supple. No thyromegaly present.  Cardiovascular: Normal rate, regular rhythm, normal heart sounds and intact distal pulses.   No murmur heard. Pulmonary/Chest: Effort normal and breath sounds normal. No respiratory distress. He has no wheezes.  Abdominal: Soft. Bowel sounds are normal. He exhibits no distension. There is no tenderness.  Musculoskeletal:  He exhibits no edema or tenderness.  Left hip pain external rotation or internal rotation and standing   Neurological: He is alert and oriented to person, place, and time.  Skin: Skin is warm and dry. No rash noted. No erythema.  Psychiatric: He has a normal mood and affect. His behavior is normal. Judgment and thought content normal.  Vitals reviewed.     BP 115/82   Pulse (!) 109   Temp 97.8 F (36.6 C) (Oral)   Ht 6' (1.829 m)   Wt 160 lb 3.2 oz (72.7 kg)   BMI 21.73 kg/m      Assessment & Plan:  1. Fatigue, unspecified type - Anemia Profile B - CMP14+EGFR - Thyroid Panel With TSH - VITAMIN D 25 Hydroxy (Vit-D Deficiency, Fractures)  2. ALS (amyotrophic lateral sclerosis) (HCC) - CMP14+EGFR  3. Malignant neoplasm of left lung, unspecified part of lung ([place]) - CMP14+EGFR  4. Pain of left hip joint - CMP14+EGFR - DG HIP UNILAT W OR W/O PELVIS 2-3 VIEWS LEFT; Future  5. Fall in home, initial encounter - CMP14+EGFR - DG HIP UNILAT W OR W/O PELVIS 2-3 VIEWS LEFT; Future  Keep all appts with [facility] pending [place] precaution discussed  Evelina Dun, FNP

## 2017-03-18 LAB — ANEMIA PROFILE B
Basophils Absolute: 0.1 10*3/uL (ref 0.0–0.2)
Basos: 1 %
EOS (ABSOLUTE): 0.2 10*3/uL (ref 0.0–0.4)
EOS: 2 %
Ferritin: 125 ng/mL (ref 30–400)
Folate: 6.8 ng/mL (ref >3.0)
HEMATOCRIT: 44.5 % (ref 37.5–51.0)
HEMOGLOBIN: 15.2 g/dL (ref 13.0–17.7)
IMMATURE GRANS (ABS): 0.1 10*3/uL (ref 0.0–0.1)
IMMATURE GRANULOCYTES: 1 %
IRON SATURATION: 15 % (ref 15–55)
Iron: 41 ug/dL (ref 38–169)
LYMPHS: 27 %
Lymphocytes Absolute: 2.7 10*3/uL (ref 0.7–3.1)
MCH: 32.3 pg (ref 26.6–33.0)
MCHC: 34.2 g/dL (ref 31.5–35.7)
MCV: 95 fL (ref 79–97)
MONOCYTES: 7 %
MONOS ABS: 0.7 10*3/uL (ref 0.1–0.9)
NEUTROS PCT: 62 %
Neutrophils Absolute: 6.2 10*3/uL (ref 1.4–7.0)
Platelets: 301 10*3/uL (ref 150–379)
RBC: 4.7 x10E6/uL (ref 4.14–5.80)
RDW: 14 % (ref 12.3–15.4)
RETIC CT PCT: 0.8 % (ref 0.6–2.6)
TIBC: 275 ug/dL (ref 250–450)
UIBC: 234 ug/dL (ref 111–343)
Vitamin B-12: 461 pg/mL (ref 232–1245)
WBC: 10 10*3/uL (ref 3.4–10.8)

## 2017-03-18 LAB — CMP14+EGFR
ALT: 9 IU/L (ref 0–44)
AST: 10 IU/L (ref 0–40)
Albumin/Globulin Ratio: 1.2 (ref 1.2–2.2)
Albumin: 4.2 g/dL (ref 3.5–5.5)
Alkaline Phosphatase: 84 IU/L (ref 39–117)
BUN/Creatinine Ratio: 10 (ref 9–20)
BUN: 9 mg/dL (ref 6–24)
Bilirubin Total: 0.2 mg/dL (ref 0.0–1.2)
CALCIUM: 9.3 mg/dL (ref 8.7–10.2)
CO2: 23 mmol/L (ref 20–29)
CREATININE: 0.9 mg/dL (ref 0.76–1.27)
Chloride: 102 mmol/L (ref 96–106)
GFR calc Af Amer: 112 mL/min/{1.73_m2} (ref >59)
GFR, EST NON AFRICAN AMERICAN: 96 mL/min/{1.73_m2} (ref >59)
GLOBULIN, TOTAL: 3.4 g/dL (ref 1.5–4.5)
Glucose: 84 mg/dL (ref 65–99)
Potassium: 4.5 mmol/L (ref 3.5–5.2)
Sodium: 142 mmol/L (ref 134–144)
TOTAL PROTEIN: 7.6 g/dL (ref 6.0–8.5)

## 2017-03-18 LAB — THYROID PANEL WITH TSH
Free Thyroxine Index: 1.8 (ref 1.2–4.9)
T3 Uptake Ratio: 22 % — ABNORMAL LOW (ref 24–39)
T4, Total: 8 ug/dL (ref 4.5–12.0)
TSH: 0.768 u[IU]/mL (ref 0.450–4.500)

## 2017-03-18 LAB — VITAMIN D 25 HYDROXY (VIT D DEFICIENCY, FRACTURES): VIT D 25 HYDROXY: 34.2 ng/mL (ref 30.0–100.0)

## 2017-03-27 ENCOUNTER — Telehealth: Payer: Self-pay

## 2017-03-27 DIAGNOSIS — M4722 Other spondylosis with radiculopathy, cervical region: Secondary | ICD-10-CM

## 2017-03-27 NOTE — Telephone Encounter (Signed)
Patient said he needs a referral to [facility] ASAP

## 2017-03-28 ENCOUNTER — Telehealth: Payer: Self-pay | Admitting: Family

## 2017-03-28 NOTE — Telephone Encounter (Signed)
Pt is having excessive day time sleepiness Concerned about sleep apnea or narcolepsy Wants sleep study Please advise

## 2017-03-28 NOTE — Addendum Note (Signed)
Addended by: Evelina Dun A on: 03/28/2017 12:17 PM   Modules accepted: Orders

## 2017-03-28 NOTE — Telephone Encounter (Signed)
Ortho referral sent

## 2017-03-30 NOTE — Telephone Encounter (Signed)
Does pt have any snoring or apnea while sleeping? This could be related to his chronic problems.He also takes pain medication and valium which can cause sleepiness.

## 2017-03-30 NOTE — Telephone Encounter (Signed)
Patient states his wife says that sometimes he snores and sometimes he does not.

## 2017-04-13 ENCOUNTER — Encounter (HOSPITAL_COMMUNITY): Payer: Self-pay

## 2017-04-13 ENCOUNTER — Emergency Department (HOSPITAL_COMMUNITY): Payer: Medicaid Other

## 2017-04-13 ENCOUNTER — Emergency Department (HOSPITAL_COMMUNITY)
Admission: EM | Admit: 2017-04-13 | Discharge: 2017-04-13 | Disposition: A | Discharge status: Home / Self Care | Payer: Medicaid Other | Attending: Emergency Medicine | Admitting: Emergency Medicine

## 2017-04-13 ENCOUNTER — Other Ambulatory Visit: Payer: Self-pay | Admitting: Family

## 2017-04-13 DIAGNOSIS — G43119 Migraine with aura, intractable, without status migrainosus: Secondary | ICD-10-CM | POA: Diagnosis not present

## 2017-04-13 DIAGNOSIS — Z8673 Personal history of transient ischemic attack (TIA), and cerebral infarction without residual deficits: Secondary | ICD-10-CM | POA: Insufficient documentation

## 2017-04-13 DIAGNOSIS — R55 Syncope and collapse: Secondary | ICD-10-CM | POA: Diagnosis not present

## 2017-04-13 DIAGNOSIS — Z79899 Other long term (current) drug therapy: Secondary | ICD-10-CM | POA: Insufficient documentation

## 2017-04-13 DIAGNOSIS — G43909 Migraine, unspecified, not intractable, without status migrainosus: Secondary | ICD-10-CM | POA: Diagnosis not present

## 2017-04-13 DIAGNOSIS — R4781 Slurred speech: Secondary | ICD-10-CM | POA: Insufficient documentation

## 2017-04-13 DIAGNOSIS — I10 Essential (primary) hypertension: Secondary | ICD-10-CM | POA: Insufficient documentation

## 2017-04-13 DIAGNOSIS — R4701 Aphasia: Secondary | ICD-10-CM | POA: Diagnosis present

## 2017-04-13 DIAGNOSIS — I2510 Atherosclerotic heart disease of native coronary artery without angina pectoris: Secondary | ICD-10-CM | POA: Insufficient documentation

## 2017-04-13 DIAGNOSIS — J449 Chronic obstructive pulmonary disease, unspecified: Secondary | ICD-10-CM | POA: Diagnosis not present

## 2017-04-13 DIAGNOSIS — Z87891 Personal history of nicotine dependence: Secondary | ICD-10-CM | POA: Insufficient documentation

## 2017-04-13 DIAGNOSIS — G43919 Migraine, unspecified, intractable, without status migrainosus: Secondary | ICD-10-CM

## 2017-04-13 LAB — PROTIME-INR
INR: 1
Prothrombin Time: 13.1 seconds (ref 11.4–15.2)

## 2017-04-13 LAB — URINALYSIS, ROUTINE W REFLEX MICROSCOPIC
BILIRUBIN URINE: NEGATIVE
GLUCOSE, UA: NEGATIVE mg/dL
HGB URINE DIPSTICK: NEGATIVE
KETONES UR: NEGATIVE mg/dL
Leukocytes, UA: NEGATIVE
Nitrite: NEGATIVE
PROTEIN: NEGATIVE mg/dL
Specific Gravity, Urine: 1.006 (ref 1.005–1.030)
pH: 7 (ref 5.0–8.0)

## 2017-04-13 LAB — DIFFERENTIAL
BASOS PCT: 0 %
Basophils Absolute: 0.1 10*3/uL (ref 0.0–0.1)
EOS ABS: 0.2 10*3/uL (ref 0.0–0.7)
Eosinophils Relative: 1 %
LYMPHS ABS: 3.2 10*3/uL (ref 0.7–4.0)
Lymphocytes Relative: 17 %
MONO ABS: 1.3 10*3/uL — AB (ref 0.1–1.0)
MONOS PCT: 7 %
NEUTROS ABS: 14.3 10*3/uL — AB (ref 1.7–7.7)
Neutrophils Relative %: 75 %

## 2017-04-13 LAB — APTT: aPTT: 28 seconds (ref 24–36)

## 2017-04-13 LAB — CBC
HEMATOCRIT: 45.7 % (ref 39.0–52.0)
HEMOGLOBIN: 15.4 g/dL (ref 13.0–17.0)
MCH: 32.2 pg (ref 26.0–34.0)
MCHC: 33.7 g/dL (ref 30.0–36.0)
MCV: 95.6 fL (ref 78.0–100.0)
Platelets: 295 10*3/uL (ref 150–400)
RBC: 4.78 MIL/uL (ref 4.22–5.81)
RDW: 13.4 % (ref 11.5–15.5)
WBC: 19.1 10*3/uL — ABNORMAL HIGH (ref 4.0–10.5)

## 2017-04-13 LAB — RAPID URINE DRUG SCREEN, HOSP PERFORMED
Amphetamines: NOT DETECTED
BARBITURATES: NOT DETECTED
Benzodiazepines: NOT DETECTED
COCAINE: NOT DETECTED
Opiates: NOT DETECTED
TETRAHYDROCANNABINOL: NOT DETECTED

## 2017-04-13 LAB — COMPREHENSIVE METABOLIC PANEL
ALT: 22 U/L (ref 17–63)
AST: 17 U/L (ref 15–41)
Albumin: 4.1 g/dL (ref 3.5–5.0)
Alkaline Phosphatase: 70 U/L (ref 38–126)
Anion gap: 7 (ref 5–15)
BUN: 12 mg/dL (ref 6–20)
CHLORIDE: 102 mmol/L (ref 101–111)
CO2: 28 mmol/L (ref 22–32)
Calcium: 9.3 mg/dL (ref 8.9–10.3)
Creatinine, Ser: 0.76 mg/dL (ref 0.61–1.24)
GLUCOSE: 108 mg/dL — AB (ref 65–99)
Potassium: 3.7 mmol/L (ref 3.5–5.1)
Sodium: 137 mmol/L (ref 135–145)
Total Bilirubin: 0.5 mg/dL (ref 0.3–1.2)
Total Protein: 7.7 g/dL (ref 6.5–8.1)

## 2017-04-13 LAB — I-STAT CHEM 8, ED
BUN: 12 mg/dL (ref 6–20)
CALCIUM ION: 1.22 mmol/L (ref 1.15–1.40)
CREATININE: 0.8 mg/dL (ref 0.61–1.24)
Chloride: 100 mmol/L — ABNORMAL LOW (ref 101–111)
GLUCOSE: 102 mg/dL — AB (ref 65–99)
HCT: 48 % (ref 39.0–52.0)
HEMOGLOBIN: 16.3 g/dL (ref 13.0–17.0)
Potassium: 3.6 mmol/L (ref 3.5–5.1)
Sodium: 139 mmol/L (ref 135–145)
TCO2: 27 mmol/L (ref 22–32)

## 2017-04-13 LAB — ETHANOL

## 2017-04-13 LAB — I-STAT TROPONIN, ED: Troponin i, poc: 0.01 ng/mL (ref 0.00–0.08)

## 2017-04-13 MED ORDER — SODIUM CHLORIDE 0.9 % IV BOLUS (SEPSIS)
1000.0000 mL | Freq: Once | INTRAVENOUS | Status: AC
  Administered 2017-04-13: 1000 mL via INTRAVENOUS

## 2017-04-13 MED ORDER — SODIUM CHLORIDE 0.9 % IV SOLN
INTRAVENOUS | Status: DC
  Administered 2017-04-13: 14:00:00 via INTRAVENOUS

## 2017-04-13 MED ORDER — HYDROMORPHONE HCL 1 MG/ML IJ SOLN
1.0000 mg | Freq: Once | INTRAMUSCULAR | Status: AC
  Administered 2017-04-13: 1 mg via INTRAVENOUS
  Filled 2017-04-13: qty 1

## 2017-04-13 MED ORDER — PROMETHAZINE HCL 25 MG/ML IJ SOLN
12.5000 mg | Freq: Once | INTRAMUSCULAR | Status: AC
  Administered 2017-04-13: 12.5 mg via INTRAVENOUS
  Filled 2017-04-13: qty 1

## 2017-04-13 MED ORDER — ONDANSETRON HCL 4 MG/2ML IJ SOLN
4.0000 mg | Freq: Once | INTRAMUSCULAR | Status: AC
  Administered 2017-04-13: 4 mg via INTRAVENOUS
  Filled 2017-04-13: qty 2

## 2017-04-13 MED ORDER — HYDROMORPHONE HCL 1 MG/ML IJ SOLN
0.5000 mg | Freq: Once | INTRAMUSCULAR | Status: AC
  Administered 2017-04-13: 0.5 mg via INTRAVENOUS
  Filled 2017-04-13: qty 1

## 2017-04-13 MED ORDER — HYDROCODONE-ACETAMINOPHEN 5-325 MG PO TABS: 1.0000 | ORAL_TABLET | Freq: Four times a day (QID) | ORAL | 0 refills | Status: DC | PRN

## 2017-04-13 MED ORDER — DEXAMETHASONE SODIUM PHOSPHATE 4 MG/ML IJ SOLN
10.0000 mg | Freq: Once | INTRAMUSCULAR | Status: AC
  Administered 2017-04-13: 10 mg via INTRAVENOUS
  Filled 2017-04-13: qty 3

## 2017-04-13 MED ORDER — DIPHENHYDRAMINE HCL 50 MG/ML IJ SOLN
25.0000 mg | Freq: Once | INTRAMUSCULAR | Status: AC
  Administered 2017-04-13: 25 mg via INTRAVENOUS
  Filled 2017-04-13: qty 1

## 2017-04-13 NOTE — ED Triage Notes (Signed)
Pt is complaining of slurred speech that started 2 days ago. Went to PCP yesterday. EMT's from [facility] were called and wanted pt to come to the ED because of this slurred speech but pt wouldn't. Pt said that yesterday speech got better, but then today speech has gotten much worse. Pt complaining of headache as well as chest tightness.

## 2017-04-13 NOTE — ED Notes (Signed)
Pt transported to MRI 

## 2017-04-13 NOTE — ED Notes (Signed)
Dr. Rogene Houston aware of pt.'s condition.

## 2017-04-13 NOTE — Discharge Instructions (Signed)
MRI of the brain and CT of the brain without any acute findings. There is some evidence of changes in the brain somewhat worse from previous MRI several years ago. Close follow-up with neurology is important: Make an appointment. Short course of pain medicine provided to help abate the headache. Return for any new or worse symptoms.

## 2017-04-13 NOTE — ED Provider Notes (Signed)
[facility] DEPT Provider Note   CSN: [id number] Arrival date & time: 04/13/17  1245     History   Chief Complaint Chief Complaint  Patient presents with  . Aphasia    HPI Bradley Rice is a 56 y.o. male.  Patient presents with a 2 day history of headache and slurred speech.  Patient was in [place] yesterday and had a syncopal episode. Patient did not want to go to the emergency department and refused transport. Patient said yesterday speech Better a little bit but then got much worse today. Associated the with kind of a global headache. Patient states she's had similar things at times over the past few years. Had an MRI several years ago which showed spots on the brain.      Past Medical History:  Diagnosis Date  . ALS (amyotrophic lateral sclerosis) ([place])   . Back pain   . Cancer ([place])    lung cancer  . COPD (chronic obstructive pulmonary disease) ([place])   . Coronary artery disease   . Depression   . Dysphagia    patient reports d/t surgery, has modified diet, has to tilt head to swallow   . History of blood transfusion 2014   post op bleed  . Migraine   . Spider bite 2012  . Stroke [facility])    11/2013    Patient Active Problem List   Diagnosis Date Noted  . Hypertension 09/30/2016  . ALS (amyotrophic lateral sclerosis) ([place])   . Lung cancer ([place]) 08/24/2015  . [facility] disease [facility]) 02/17/2015  . GAD (generalized anxiety disorder) 02/17/2015  . Depression 02/17/2015  . Current smoker 02/17/2015  . Postlaminectomy syndrome, cervical region 08/15/2014  . Cervical dystonia 06/13/2014  . Cervicogenic headache 05/09/2014  . TIA (transient ischemic attack) 12/28/2013  . Cervical spondylosis with radiculopathy 12/17/2013  . ED (erectile dysfunction) 11/21/2013  . Multilevel spondylosis 06/25/2012  . 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 Carpal tunnel syndrome of left wrist 06/25/2012    Class: Chronic    Past Surgical History:  Procedure Laterality Date    . ANTERIOR CERVICAL DECOMP/DISCECTOMY FUSION  12/22/2011   Procedure: ANTERIOR CERVICAL DECOMPRESSION/DISCECTOMY FUSION 3 LEVELS;  Surgeon: Melina Schools, MD;  Location: [place] [facility];  Service: Orthopedics;  Laterality: N/A;  ACDF C5-T1  . ANTERIOR CERVICAL DECOMP/DISCECTOMY FUSION  06/25/2012   Procedure: ANTERIOR CERVICAL DECOMPRESSION/DISCECTOMY FUSION 1 LEVEL/HARDWARE REMOVAL;  Surgeon: [name], MD;  Location: [facility];  Service: Orthopedics;  Laterality: N/A;  Removal anterior cervical plate C5-T1, Explore Fusion, Left C5-6, C6-7, C7-T1 Re-do Foraminotomy, Left open carpal tunnel release with release of ulnar nerve at [facility], Posterior Cervical Fusion with lateral mass screw  . BACK SURGERY    . CARDIAC CATHETERIZATION    . CARPAL TUNNEL RELEASE  06/25/2012   Procedure: CARPAL TUNNEL RELEASE;  Surgeon: [name], MD;  Location: [facility];  Service: Orthopedics;  Laterality: Left;  as above  . CHOLECYSTECTOMY  2009  . CORONARY ANGIOPLASTY  2001   [facility]  . LUMBAR FUSION  2000  . POSTERIOR CERVICAL FUSION/FORAMINOTOMY  06/25/2012   Procedure: POSTERIOR CERVICAL FUSION/FORAMINOTOMY LEVEL 1;  Surgeon: [name], MD;  Location: [facility];  Service: Orthopedics;  Laterality: N/A;  as above  . POSTERIOR CERVICAL FUSION/FORAMINOTOMY N/A 12/17/2013   Procedure: REMOVAL OF POSTERIOR CERVICAL FUSION LATERAL MASS SCREWS AND RODS C5-T1, EXPLORE FUSION, LEFT SIX-SEVEN FORAMINOTOMY;  Surgeon: [name], MD;  Location: [facility];  Service: Orthopedics;  Laterality:  N/A;       Home Medications    Prior to Admission medications   Medication Sig Start Date End Date Taking? Authorizing Provider  albuterol (PROVENTIL HFA;VENTOLIN HFA) 108 (90 Base) MCG/ACT inhaler Inhale 2 puffs into the lungs every 6 (six) hours as needed for wheezing or shortness of breath. 11/17/16  Yes Hawks, Christy A, FNP  albuterol (PROVENTIL) (2.5 MG/3ML) 0.083% nebulizer solution Take 3 mLs (2.5 mg total) by nebulization  every 6 (six) hours as needed for wheezing or shortness of breath. 10/17/16  Yes Hawks, Christy A, FNP  budesonide-formoterol (SYMBICORT) 80-4.5 MCG/ACT inhaler Inhale 2 puffs into the lungs 2 (two) times daily. 08/25/16  Yes Hawks, Christy A, FNP  clonazePAM (KLONOPIN) 0.5 MG tablet Take 1 tablet (0.5 mg total) by mouth 2 (two) times daily as needed for anxiety. 11/28/16  Yes Hawks, Christy A, FNP  escitalopram (LEXAPRO) 20 MG tablet Take 1 tablet (20 mg total) by mouth daily. 09/30/16  Yes Hawks, Christy A, FNP  fentaNYL (DURAGESIC - DOSED MCG/HR) 75 MCG/HR Place 75 mcg onto the skin every 3 (three) days.  12/23/15  Yes [provider]  fluticasone furoate-vilanterol (BREO ELLIPTA) 100-25 MCG/INH AEPB Inhale into the lungs.   Yes [provider]  methocarbamol (ROBAXIN) 500 MG tablet Take 1 tablet (500 mg total) by mouth every 6 (six) hours as needed for muscle spasms. 06/06/16  Yes Hawks, Christy A, FNP  metoprolol succinate (TOPROL-XL) 50 MG 24 hr tablet Take 1 tablet (50 mg total) by mouth daily. 09/30/16  Yes Hawks, Christy A, FNP  nortriptyline (PAMELOR) 75 MG capsule Take 75 mg by mouth at bedtime. 08/19/16  Yes [provider]  omeprazole (PRILOSEC) 20 MG capsule Take 1 capsule (20 mg total) by mouth daily. 09/02/16  Yes Hawks, Christy A, FNP  oxyCODONE-acetaminophen (PERCOCET) 10-325 MG tablet Take 1 tablet by mouth every 4 (four) hours as needed.    Yes [provider]  sildenafil (REVATIO) 20 MG tablet Take 2-5 tabs PRN prior to sexual activity. 08/25/16  Yes Hawks, Christy A, FNP  tiZANidine (ZANAFLEX) 4 MG tablet Take 4 mg by mouth at bedtime.  02/13/15  Yes [provider]  topiramate (TOPAMAX) 50 MG tablet Take 50 mg by mouth 2 (two) times daily.  01/12/16 04/13/17 Yes [provider]  HYDROcodone-acetaminophen (NORCO/VICODIN) 5-325 MG tablet Take 1-2 tablets by mouth every 6 (six) hours as needed. 04/13/17   Fredia Sorrow, MD    Family  History Family History  Problem Relation Age of Onset  . ALS Mother   . Cancer Mother   . Cancer Sister   . Anesthesia problems Neg Hx     Social History Social History  Substance Use Topics  . Smoking status: Former Smoker    Packs/day: 1.00    Years: 30.00    Types: Cigarettes    Quit date: 2013  . Smokeless tobacco: Never Used  . Alcohol use No     Allergies   Patient has no known allergies.   Review of Systems Review of Systems  Constitutional: Negative for fever.  HENT: Negative for congestion.   Eyes: Negative for visual disturbance.  Respiratory: Negative for shortness of breath.   Cardiovascular: Negative for chest pain.  Gastrointestinal: Negative for abdominal pain.  Genitourinary: Negative for dysuria.  Musculoskeletal: Negative for back pain and neck pain.  Skin: Negative for rash.  Neurological: Positive for syncope, speech difficulty and headaches. Negative for dizziness, facial asymmetry, weakness and numbness.  Hematological: Does not bruise/bleed easily.  Psychiatric/Behavioral: Negative for confusion.     Physical Exam Updated Vital Signs BP 102/69 (BP Location: Left Arm)   Pulse 81   Temp 98.1 F (36.7 C)   Resp 12   Ht 1.829 m (6')   Wt 72.6 kg (160 lb)   SpO2 99%   BMI 21.70 kg/m   Physical Exam  Constitutional: He is oriented to person, place, and time. He appears well-developed and well-nourished.  HENT:  Head: Normocephalic and atraumatic.  Mouth/Throat: Oropharynx is clear and moist.  Eyes: Pupils are equal, round, and reactive to light. Conjunctivae and EOM are normal.  Neck: Normal range of motion. Neck supple.  Cardiovascular: Normal rate, regular rhythm and normal heart sounds.   Pulmonary/Chest: Effort normal and breath sounds normal. No respiratory distress.  Abdominal: Soft. Bowel sounds are normal. He exhibits no distension.  Musculoskeletal: Normal range of motion.  Neurological: He is alert and oriented to person,  place, and time. A cranial nerve deficit is present. No sensory deficit. He exhibits normal muscle tone. Coordination normal.  Patient with slurred speech but mostly stuttering. No other significant neuro focal deficit.  Skin: Skin is warm.  Nursing note and vitals reviewed.    ED Treatments / Results  Labs (all labs ordered are listed, but only abnormal results are displayed) Labs Reviewed  CBC - Abnormal; Notable for the following:       Result Value   WBC 19.1 (*)    All other components within normal limits  DIFFERENTIAL - Abnormal; Notable for the following:    Neutro Abs 14.3 (*)    Monocytes Absolute 1.3 (*)    All other components within normal limits  COMPREHENSIVE METABOLIC PANEL - Abnormal; Notable for the following:    Glucose, Bld 108 (*)    All other components within normal limits  I-STAT CHEM 8, ED - Abnormal; Notable for the following:    Chloride 100 (*)    Glucose, Bld 102 (*)    All other components within normal limits  ETHANOL  PROTIME-INR  APTT  RAPID URINE DRUG SCREEN, HOSP PERFORMED  URINALYSIS, ROUTINE W REFLEX MICROSCOPIC  I-STAT TROPONIN, ED    EKG  EKG Interpretation  Date/Time:  Thursday April 13 2017 13:01:19 EDT Ventricular Rate:  88 PR Interval:    QRS Duration: 83 QT Interval:  351 QTC Calculation: 425 R Axis:   65 Text Interpretation:  Sinus rhythm Borderline short PR interval No significant change since last tracing Confirmed by Fredia Sorrow [phone]) on 04/13/2017 1:15:47 PM       Radiology Dg Chest 2 View  Result Date: 04/13/2017 CLINICAL DATA:  Slurred speech that started 2 days ago. Chest tightness. EXAM: CHEST  2 VIEW COMPARISON:  08/25/2016 FINDINGS: Borderline cardiomegaly. No aortic aneurysm. Chronic mild interstitial prominence is again seen bilaterally some which is subpleural location and may reflect interstitial fibrosis and/or scarring. No pneumonic consolidation, effusion or pneumothorax. The visualized  skeletal structures are unremarkable. IMPRESSION: No active cardiopulmonary disease. Chronic interstitial lung disease some of which may reflect areas of subpleural fibrosis. Electronically Signed   By: Ashley Royalty M.D.   On: 04/13/2017 14:12   Ct Head Wo Contrast  Result Date: 04/13/2017 CLINICAL DATA:  Slurred speech for 2 days and headaches EXAM: CT HEAD WITHOUT CONTRAST TECHNIQUE: Contiguous axial images were obtained from the base of the skull through the vertex without intravenous contrast. COMPARISON:  None. FINDINGS: Brain: No evidence of acute infarction, hemorrhage,  hydrocephalus, extra-axial collection or mass lesion/mass effect. Vascular: No hyperdense vessel or unexpected calcification. Skull: Normal. Negative for fracture or focal lesion. Sinuses/Orbits: Mild mucosal thickening is noted within the left maxillary and sphenoid sinuses. Other: None IMPRESSION: No acute intracranial abnormality noted. Mucosal changes are noted within the left maxillary and sphenoid sinuses. Electronically Signed   By: Inez Catalina M.D.   On: 04/13/2017 13:40   Mr Brain Wo Contrast (neuro Protocol)  Result Date: 04/13/2017 CLINICAL DATA:  Initial evaluation for acute slurred speech. EXAM: MRI HEAD WITHOUT CONTRAST TECHNIQUE: Multiplanar, multiecho pulse sequences of the brain and surrounding structures were obtained without intravenous contrast. COMPARISON:  Prior CT from earlier same day as well as previous MRI from 12/30/2013. FINDINGS: Brain: Study mildly degraded by motion artifact. Cerebral volume within normal limits for age. Patchy multifocal T2/FLAIR hyperintensities seen involving the periventricular, deep, and subcortical white matter both cerebral hemispheres, nonspecific, but progressed relative to previous MRI from 2015. No abnormal foci of restricted diffusion to suggest acute or subacute ischemia. Gray-white matter differentiation maintained. No encephalomalacia to suggest chronic infarction. No acute  or chronic intracranial hemorrhage. No mass lesion, midline shift or mass effect. Ventricles normal in size without hydrocephalus. No extra-axial fluid collection. Major dural sinuses grossly patent. A FLAIR signal abnormality within the proximal left transverse sinus felt to likely reflect slow flow. Incidental note made of a partially empty sella. Midline structures intact and normal. Vascular: Major intracranial vascular flow voids are maintained. Skull and upper cervical spine: Craniocervical junction within normal limits. Visualized upper cervical spine normal. Bone marrow signal intensity within normal limits. No scalp soft tissue abnormality. Sinuses/Orbits: Globes and orbital soft tissues within normal limits. Moderate mucosal thickening within the left ethmoidal air cells, left sphenoid sinus, and left maxillary sinus. Superimposed fluid levels noted within the maxillary and sphenoid sinuses. No mastoid effusion. Inner ear structures normal. Other: None. IMPRESSION: 1. No acute intracranial infarct or other process identified. 2. Scattered T2/FLAIR hyperintensities involving the supratentorial cerebral white matter, mildly advanced for age. Again, findings are nonspecific, but can be seen in the setting of accelerated chronic microvascular ischemic disease, demyelinating process, vasculitis, sequelae of complicated migraine, or previous infectious or inflammatory process. Changes are mildly progressed relative to 2015. 3. Acute left paranasal sinusitis. Electronically Signed   By: Jeannine Boga M.D.   On: 04/13/2017 15:27    Procedures Procedures (including critical care time)  Medications Ordered in ED Medications  0.9 %  sodium chloride infusion ( Intravenous New Bag/Given 04/13/17 1405)  ondansetron (ZOFRAN) injection 4 mg (4 mg Intravenous Given 04/13/17 1518)  HYDROmorphone (DILAUDID) injection 1 mg (1 mg Intravenous Given 04/13/17 1517)  sodium chloride 0.9 % bolus 1,000 mL (0 mLs  Intravenous Stopped 04/13/17 1640)  dexamethasone (DECADRON) injection 10 mg (10 mg Intravenous Given 04/13/17 1628)  promethazine (PHENERGAN) injection 12.5 mg (12.5 mg Intravenous Given 04/13/17 1633)  diphenhydrAMINE (BENADRYL) injection 25 mg (25 mg Intravenous Given 04/13/17 1632)  HYDROmorphone (DILAUDID) injection 0.5 mg (0.5 mg Intravenous Given 04/13/17 1634)     Initial Impression / Assessment and Plan / ED Course  I have reviewed the triage vital signs and the nursing notes.  Pertinent labs & imaging results that were available during my care of the patient were reviewed by me and considered in my medical decision making (see chart for details).      Patient approached as a stroke greater than 24 hours of age. Stroke order set initially ordered.  The patient presented  with headache and a aphasia. Symptoms started2 days ago. Suspect after extensive workup to include head CT MRI that this is a complicated migraine picture. Patient has some demyelination changes and some brain changes that were present in MRI a few years back they are slightly worse. No evidence of stroke no evidence of brain tumor. Patient treated here with pain medicine and migraine cocktail with significant improvement. Speech problem has resolved.  Patient stable for discharge home and close follow-up with neurology. Patient understands important to follow with neurology. Patient gives a history of similar things occurring on and off over the past the several years.   Final Clinical Impressions(s) / ED Diagnoses   Final diagnoses:  Intractable complicated migraine    New Prescriptions New Prescriptions   HYDROCODONE-ACETAMINOPHEN (NORCO/VICODIN) 5-325 MG TABLET    Take 1-2 tablets by mouth every 6 (six) hours as needed.     Fredia Sorrow, MD 04/13/17 1743

## 2017-04-18 ENCOUNTER — Encounter: Payer: Self-pay | Admitting: Family

## 2017-04-18 ENCOUNTER — Ambulatory Visit (INDEPENDENT_AMBULATORY_CARE_PROVIDER_SITE_OTHER): Payer: Medicaid Other | Admitting: Family

## 2017-04-18 VITALS — BP 137/88 | HR 104 | Temp 98.1°F | Ht 72.0 in | Wt 159.2 lb

## 2017-04-18 DIAGNOSIS — G43109 Migraine with aura, not intractable, without status migrainosus: Secondary | ICD-10-CM

## 2017-04-18 DIAGNOSIS — F411 Generalized anxiety disorder: Secondary | ICD-10-CM

## 2017-04-18 DIAGNOSIS — R911 Solitary pulmonary nodule: Secondary | ICD-10-CM

## 2017-04-18 DIAGNOSIS — I10 Essential (primary) hypertension: Secondary | ICD-10-CM

## 2017-04-18 NOTE — Progress Notes (Addendum)
   Subjective:    Patient ID: Bradley Rice, male    DOB: April 21, 1961, 56 y.o.   MRN: [id number]  HPI Pt presents to the office today for hospital follow up. Pt went to the ED on 04/13/17 with headache and slurred speech. PT had CT head, MR Brain that ruled out as a stroke and thought to be complicated migraine.   Pt was given migraine cocktail and pain medication and his symptoms improved. PT to follow up with neurologists.  I have seen patient over the last year regularly. Pt has told me that he had Non-small cell cancer that was being treating with radiation, but his radiation had been stopped due to increasing in tumor size. I reviewed patients scans and can not see where patient has ever been diagnosed with Small cell cancer. He does have pulmonary nodules that they are being followed annually.  Pt states he was seen at [facility].   [facility] notes reviewed  Review of Systems  Constitutional: Negative.   HENT: Negative.   Respiratory: Negative.   Cardiovascular: Negative.   Gastrointestinal: Negative.   Endocrine: Negative.   Genitourinary: Negative.   Musculoskeletal: Negative.   Neurological: Positive for headaches.  Hematological: Negative.   Psychiatric/Behavioral: Negative.        Forgetfulness   All other systems reviewed and are negative.      Objective:   Physical Exam  Constitutional: He is oriented to person, place, and time. He appears well-developed and well-nourished. No distress.  HENT:  Head: Normocephalic.  Right Ear: External ear normal.  Left Ear: External ear normal.  Nose: Nose normal.  Mouth/Throat: Oropharynx is clear and moist.  Eyes: Pupils are equal, round, and reactive to light. Right eye exhibits no discharge. Left eye exhibits no discharge.  Neck: Normal range of motion. Neck supple. No thyromegaly present.  Cardiovascular: Normal rate, regular rhythm, normal heart sounds and intact distal pulses.   No murmur heard. Pulmonary/Chest:  Effort normal. No respiratory distress. He has decreased breath sounds. He has no wheezes.  Abdominal: Soft. Bowel sounds are normal. He exhibits no distension. There is no tenderness.  Musculoskeletal: Normal range of motion. He exhibits no edema or tenderness.  Neurological: He is alert and oriented to person, place, and time.  Skin: Skin is warm and dry. No rash noted. No erythema.  Psychiatric: He has a normal mood and affect. His behavior is normal. Judgment and thought content normal.  Vitals reviewed.    BP 137/88   Pulse (!) 104   Temp 98.1 F (36.7 C) (Oral)   Ht 6' (1.829 m)   Wt 159 lb 3.2 oz (72.2 kg)   BMI 21.59 kg/m      Assessment & Plan:  1. Essential hypertension  2. Lung nodule  3. GAD (generalized anxiety disorder) Long discussion that we could not continue his Klonopin since patient is taking his narcotics Discussed we will wean these to once daily then every other day and then stop Pt's drug screen from ED was negative for Benzo's    4. Complicated migraine Long discussion pt to follow up with neurologist Limit alcohol and caffeine  Get plenty of rest  Avoid stress  - Ambulatory referral to Neurology  Evelina Dun, FNP

## 2017-04-18 NOTE — Patient Instructions (Signed)
Migraine Headache A migraine headache is an intense, throbbing pain on one side or both sides of the head. Migraines may also cause other symptoms, such as nausea, vomiting, and sensitivity to light and noise. What are the causes? Doing or taking certain things may also trigger migraines, such as:  Alcohol.  Smoking.  Medicines, such as: ? Medicine used to treat chest pain (nitroglycerine). ? Birth control pills. ? Estrogen pills. ? Certain blood pressure medicines.  Aged cheeses, chocolate, or caffeine.  Foods or drinks that contain nitrates, glutamate, aspartame, or tyramine.  Physical activity.  Other things that may trigger a migraine include:  Menstruation.  Pregnancy.  Hunger.  Stress, lack of sleep, too much sleep, or fatigue.  Weather changes.  What increases the risk? The following factors may make you more likely to experience migraine headaches:  Age. Risk increases with age.  Family history of migraine headaches.  Being Caucasian.  Depression and anxiety.  Obesity.  Being a woman.  Having a hole in the heart (patent foramen ovale) or other heart problems.  What are the signs or symptoms? The main symptom of this condition is pulsating or throbbing pain. Pain may:  Happen in any area of the head, such as on one side or both sides.  Interfere with daily activities.  Get worse with physical activity.  Get worse with exposure to bright lights or loud noises.  Other symptoms may include:  Nausea.  Vomiting.  Dizziness.  General sensitivity to bright lights, loud noises, or smells.  Before you get a migraine, you may get warning signs that a migraine is developing (aura). An aura may include:  Seeing flashing lights or having blind spots.  Seeing bright spots, halos, or zigzag lines.  Having tunnel vision or blurred vision.  Having numbness or a tingling feeling.  Having trouble talking.  Having muscle weakness.  How is this  diagnosed? A migraine headache can be diagnosed based on:  Your symptoms.  A physical exam.  Tests, such as CT scan or MRI of the head. These imaging tests can help rule out other causes of headaches.  Taking fluid from the spine (lumbar puncture) and analyzing it (cerebrospinal fluid analysis, or CSF analysis).  How is this treated? A migraine headache is usually treated with medicines that:  Relieve pain.  Relieve nausea.  Prevent migraines from coming back.  Treatment may also include:  Acupuncture.  Lifestyle changes like avoiding foods that trigger migraines.  Follow these instructions at home: Medicines  Take over-the-counter and prescription medicines only as told by your health care provider.  Do not drive or use heavy machinery while taking prescription pain medicine.  To prevent or treat constipation while you are taking prescription pain medicine, your health care provider may recommend that you: ? Drink enough fluid to keep your urine clear or pale yellow. ? Take over-the-counter or prescription medicines. ? Eat foods that are high in fiber, such as fresh fruits and vegetables, whole grains, and beans. ? Limit foods that are high in fat and processed sugars, such as fried and sweet foods. Lifestyle  Avoid alcohol use.  Do not use any products that contain nicotine or tobacco, such as cigarettes and e-cigarettes. If you need help quitting, ask your health care provider.  Get at least 8 hours of sleep every night.  Limit your stress. General instructions   Keep a journal to find out what may trigger your migraine headaches. For example, write down: ? What you eat and   drink. ? How much sleep you get. ? Any change to your diet or medicines.  If you have a migraine: ? Avoid things that make your symptoms worse, such as bright lights. ? It may help to lie down in a dark, quiet room. ? Do not drive or use heavy machinery. ? Ask your health care provider  what activities are safe for you while you are experiencing symptoms.  Keep all follow-up visits as told by your health care provider. This is important. Contact a health care provider if:  You develop symptoms that are different or more severe than your usual migraine symptoms. Get help right away if:  Your migraine becomes severe.  You have a fever.  You have a stiff neck.  You have vision loss.  Your muscles feel weak or like you cannot control them.  You start to lose your balance often.  You develop trouble walking.  You faint. This information is not intended to replace advice given to you by your health care provider. Make sure you discuss any questions you have with your health care provider. Document Released: 07/18/2005 Document Revised: 02/05/2016 Document Reviewed: 01/04/2016 Elsevier Interactive Patient Education  2017 [employer] Inc[employer]   

## 2017-04-19 ENCOUNTER — Encounter: Payer: Self-pay | Admitting: Neurology

## 2017-06-13 ENCOUNTER — Ambulatory Visit: Payer: Medicaid Other | Admitting: Family

## 2017-06-13 ENCOUNTER — Encounter: Payer: Self-pay | Admitting: Family

## 2017-06-13 VITALS — BP 127/84 | HR 86 | Temp 98.2°F | Ht 72.0 in | Wt 158.6 lb

## 2017-06-13 DIAGNOSIS — R413 Other amnesia: Secondary | ICD-10-CM

## 2017-06-13 DIAGNOSIS — N509 Disorder of male genital organs, unspecified: Secondary | ICD-10-CM

## 2017-06-13 DIAGNOSIS — G43109 Migraine with aura, not intractable, without status migrainosus: Secondary | ICD-10-CM | POA: Diagnosis not present

## 2017-06-13 DIAGNOSIS — N5089 Other specified disorders of the male genital organs: Secondary | ICD-10-CM | POA: Diagnosis not present

## 2017-06-13 DIAGNOSIS — N50819 Testicular pain, unspecified: Secondary | ICD-10-CM

## 2017-06-13 LAB — URINALYSIS, COMPLETE
Bilirubin, UA: NEGATIVE
GLUCOSE, UA: NEGATIVE
Ketones, UA: NEGATIVE
Leukocytes, UA: NEGATIVE
NITRITE UA: NEGATIVE
PH UA: 6 (ref 5.0–7.5)
Protein, UA: NEGATIVE
RBC, UA: NEGATIVE
Specific Gravity, UA: 1.015 (ref 1.005–1.030)
UUROB: 0.2 mg/dL (ref 0.2–1.0)

## 2017-06-13 LAB — MICROSCOPIC EXAMINATION
BACTERIA UA: NONE SEEN
EPITHELIAL CELLS (NON RENAL): NONE SEEN /HPF (ref 0–10)
RBC MICROSCOPIC, UA: NONE SEEN /HPF (ref 0–?)
Renal Epithel, UA: NONE SEEN /hpf
WBC UA: NONE SEEN /HPF (ref 0–?)

## 2017-06-13 NOTE — Progress Notes (Signed)
   Subjective:    Patient ID: Bradley Rice, male    DOB: 02-27-1961, 56 y.o.   MRN: [id number]  HPI Pt presents to the office today with a "knot" on right testicle that he noticed a month ago. PT states the area is painful that is worse when he takes a bath or "sits wrong". Pt states the intermittent pain is a 10 out 10. He thought this was from his vesicotomy 29 years ago, but just noticed it last month. Denies any urinary frequency, urgency, hematuria, or discharge.    Pt states he feels like his memory is becoming worse. Pt states he has a hard time remembering what he did yesterday at times. Pt states this weekend he was a race track with his grandson and could not remember how to get around the track.  Pt had MRI of brain that showed "No acute intracranial infarct or other process identified. 2. Scattered T2/FLAIR hyperintensities involving the supratentorial cerebral white matter, mildly advanced for age. Again, findings are nonspecific, but can be seen in the setting of accelerated chronic microvascular ischemic disease, demyelinating process, vasculitis,  sequelae of complicated migraine, or previous infectious or inflammatory process. Changes are mildly progressed relative to  2015. 3. Acute left paranasal sinusitis."  Pt has Neurologists appt in December 2018.   Review of Systems  All other systems reviewed and are negative.      Objective:   Physical Exam  Constitutional: He is oriented to person, place, and time. He appears well-developed and well-nourished. No distress.  HENT:  Head: Normocephalic.  Right Ear: External ear normal.  Left Ear: External ear normal.  Mouth/Throat: Oropharynx is clear and moist.  Eyes: Pupils are equal, round, and reactive to light. Right eye exhibits no discharge. Left eye exhibits no discharge.  Neck: Normal range of motion. Neck supple. No thyromegaly present.  Cardiovascular: Normal rate, regular rhythm, normal heart sounds and intact distal  pulses.  No murmur heard. Pulmonary/Chest: Effort normal and breath sounds normal. No respiratory distress. He has no wheezes.  Abdominal: Soft. Bowel sounds are normal. He exhibits no distension. There is no tenderness.  Genitourinary: Right testis shows tenderness. Left testis shows no mass and no tenderness.  Musculoskeletal: Normal range of motion. He exhibits no edema or tenderness.  Neurological: He is alert and oriented to person, place, and time.  Skin: Skin is warm and dry. No rash noted. No erythema.  Psychiatric: He has a normal mood and affect. His behavior is normal. Judgment and thought content normal.  Vitals reviewed.    BP 127/84   Pulse 86   Temp 98.2 F (36.8 C) (Oral)   Ht 6' (1.829 m)   Wt 158 lb 9.6 oz (71.9 kg)   BMI 21.51 kg/m      Assessment & Plan:  1. Testicle tenderness - US Scrotum; Future - [place] Art/Ven Flow Abd Pelv Doppler; Future - Urinalysis, Complete - Urine Culture - GC/Chlamydia Probe Amp  2. Memory loss  3. Complicated migraine  4. Testicular nodule  Urine pending Follow up with Neurologists, I feel like his memory is related to migraine. Continue Topamax  RTO prn and keep follow up appt  Evelina Dun, FNP

## 2017-06-13 NOTE — Patient Instructions (Signed)
Testicular Self-Exam A self-examination of your testicles (testicular self-exam) involves looking at and feeling your testicles for abnormal lumps or swelling. Several things can cause swelling, lumps, or pain in your testicles. Some of these causes are:  Injuries.  Inflammation.  Infection.  Buildup of fluids around your testicle (hydrocele).  Twisted testicles (testicular torsion).  Testicular cancer.  Why is it important to do a testicular self-exam? Self-examination of the testicles and the left and right groin areas may be recommended if you are at risk for testicular cancer. Your groin is where your lower abdomen meets your upper thighs. You may be at risk for testicular cancer if you have:  An undescended testicle (cryptorchidism).  A history of previous testicular cancer.  A family history of testicular cancer.  How to do a testicular self-exam The testicles are easiest to examine after a warm bath or shower. They are more difficult to examine when you are cold. This is because the muscles attached to the testicles retract and pull them up higher or into the abdomen. A normal testicle is egg-shaped and feels firm. It is smooth and not tender. The spermatic cord can be felt as a firm, spaghetti-like cord at the back of your testicle. Look and feel for changes  Stand and hold your penis away from your body.  Look at each testicle to check for lumps or swelling.  Roll each testicle between your thumb and forefinger, feeling the entire testicle. Feel for: ? Lumps. ? Swelling. ? Discomfort.  Check the groin area between your abdomen and upper thighs on both sides of your body. Look and feel for any swelling or bumps that are tender. These could be enlarged lymph nodes. Contact a health care provider if:  You find any bumps or lumps, such as a small, hard, pea-sized lump.  You find swelling, pain, or soreness.  You see or feel any other changes in your  testicles. Summary  A self-examination of your testicles (testicular self-exam) involves looking at and feeling your testicles for any changes.  Self-examination of the testicles and the left and right groin areas may be recommended if you are at risk for testicular cancer.  You should check each of your testicles for lumps, swelling, or discomfort.  You should check for swelling or tender bumps in your groin area between your lower abdomen and upper thighs. This information is not intended to replace advice given to you by your health care provider. Make sure you discuss any questions you have with your health care provider. Document Released: 10/24/2000 Document Revised: 06/13/2016 Document Reviewed: 06/13/2016 Elsevier Interactive Patient Education  2017 Elsevier Inc.  

## 2017-06-14 LAB — URINE CULTURE: ORGANISM ID, BACTERIA: NO GROWTH

## 2017-06-15 LAB — GC/CHLAMYDIA PROBE AMP
CHLAMYDIA, DNA PROBE: NEGATIVE
NEISSERIA GONORRHOEAE BY PCR: NEGATIVE

## 2017-06-16 ENCOUNTER — Other Ambulatory Visit: Payer: Self-pay | Admitting: *Deleted

## 2017-06-16 DIAGNOSIS — M4722 Other spondylosis with radiculopathy, cervical region: Secondary | ICD-10-CM

## 2017-06-16 DIAGNOSIS — G243 Spasmodic torticollis: Secondary | ICD-10-CM

## 2017-06-16 DIAGNOSIS — G5602 Carpal tunnel syndrome, left upper limb: Secondary | ICD-10-CM

## 2017-06-16 DIAGNOSIS — M47819 Spondylosis without myelopathy or radiculopathy, site unspecified: Secondary | ICD-10-CM

## 2017-06-19 MED ORDER — METHOCARBAMOL 500 MG PO TABS: 500.0000 mg | ORAL_TABLET | Freq: Four times a day (QID) | ORAL | 3 refills | Status: DC | PRN

## 2017-06-26 ENCOUNTER — Ambulatory Visit (HOSPITAL_COMMUNITY)
Admission: RE | Admit: 2017-06-26 | Discharge: 2017-06-26 | Disposition: A | Discharge status: Home / Self Care | Payer: Medicaid Other | Source: Ambulatory Visit | Attending: Family | Admitting: Family

## 2017-06-26 DIAGNOSIS — N433 Hydrocele, unspecified: Secondary | ICD-10-CM | POA: Diagnosis not present

## 2017-06-26 DIAGNOSIS — N503 Cyst of epididymis: Secondary | ICD-10-CM | POA: Diagnosis not present

## 2017-06-26 DIAGNOSIS — N509 Disorder of male genital organs, unspecified: Secondary | ICD-10-CM | POA: Insufficient documentation

## 2017-06-26 DIAGNOSIS — N50819 Testicular pain, unspecified: Secondary | ICD-10-CM

## 2017-07-07 DIAGNOSIS — G44001 Cluster headache syndrome, unspecified, intractable: Secondary | ICD-10-CM | POA: Diagnosis not present

## 2017-07-07 DIAGNOSIS — G894 Chronic pain syndrome: Secondary | ICD-10-CM | POA: Diagnosis not present

## 2017-07-07 DIAGNOSIS — M5416 Radiculopathy, lumbar region: Secondary | ICD-10-CM | POA: Diagnosis not present

## 2017-08-04 ENCOUNTER — Ambulatory Visit: Payer: Medicaid Other | Admitting: Neurology

## 2017-08-16 ENCOUNTER — Ambulatory Visit: Payer: Medicaid Other | Admitting: Family

## 2017-08-16 ENCOUNTER — Encounter: Payer: Self-pay | Admitting: Family

## 2017-08-16 ENCOUNTER — Ambulatory Visit (INDEPENDENT_AMBULATORY_CARE_PROVIDER_SITE_OTHER): Payer: Medicaid Other

## 2017-08-16 ENCOUNTER — Telehealth: Payer: Self-pay | Admitting: Family

## 2017-08-16 VITALS — BP 128/80 | HR 97 | Temp 98.2°F | Ht 72.0 in | Wt 160.0 lb

## 2017-08-16 DIAGNOSIS — J441 Chronic obstructive pulmonary disease with (acute) exacerbation: Secondary | ICD-10-CM | POA: Diagnosis not present

## 2017-08-16 DIAGNOSIS — N509 Disorder of male genital organs, unspecified: Secondary | ICD-10-CM | POA: Diagnosis not present

## 2017-08-16 DIAGNOSIS — L989 Disorder of the skin and subcutaneous tissue, unspecified: Secondary | ICD-10-CM | POA: Diagnosis not present

## 2017-08-16 DIAGNOSIS — N503 Cyst of epididymis: Secondary | ICD-10-CM | POA: Diagnosis not present

## 2017-08-16 DIAGNOSIS — F331 Major depressive disorder, recurrent, moderate: Secondary | ICD-10-CM

## 2017-08-16 DIAGNOSIS — N50819 Testicular pain, unspecified: Secondary | ICD-10-CM

## 2017-08-16 DIAGNOSIS — F411 Generalized anxiety disorder: Secondary | ICD-10-CM

## 2017-08-16 MED ORDER — FLUTICASONE FUROATE-VILANTEROL 100-25 MCG/INH IN AEPB: 1.0000 | INHALATION_SPRAY | Freq: Every day | RESPIRATORY_TRACT | 3 refills | Status: DC

## 2017-08-16 MED ORDER — PREDNISONE 10 MG (21) PO TBPK: ORAL_TABLET | ORAL | 0 refills | Status: DC

## 2017-08-16 MED ORDER — DOXYCYCLINE HYCLATE 100 MG PO TABS: 100.0000 mg | ORAL_TABLET | Freq: Two times a day (BID) | ORAL | 0 refills | Status: DC

## 2017-08-16 MED ORDER — ESCITALOPRAM OXALATE 20 MG PO TABS: 20.0000 mg | ORAL_TABLET | Freq: Every day | ORAL | 1 refills | Status: DC

## 2017-08-16 NOTE — Patient Instructions (Signed)

## 2017-08-16 NOTE — Telephone Encounter (Signed)
Per pt Memory Dance is not covered by Medicaid Please change to something else

## 2017-08-16 NOTE — Progress Notes (Signed)
   Subjective:    Patient ID: Bradley Rice, male    DOB: Jan 19, 1961, 57 y.o.   MRN: [id number]  Cough  This is a new problem. The current episode started in the past 7 days. The problem has been gradually worsening. The problem occurs every few minutes. The cough is non-productive. Associated symptoms include chills, a fever, headaches, myalgias, nasal congestion, shortness of breath and wheezing. Pertinent negatives include no ear congestion or ear pain. Risk factors for lung disease include smoking/tobacco exposure. He has tried rest for the symptoms. The treatment provided mild relief. His past medical history is significant for COPD.      Review of Systems  Constitutional: Positive for chills and fever.  HENT: Negative for ear pain.   Respiratory: Positive for cough, shortness of breath and wheezing.   Musculoskeletal: Positive for myalgias.  Neurological: Positive for headaches.  All other systems reviewed and are negative.      Objective:   Physical Exam  Constitutional: He is oriented to person, place, and time. He appears well-developed and well-nourished. No distress.  HENT:  Head: Normocephalic.  Right Ear: External ear normal. Tympanic membrane is erythematous (mildly).  Left Ear: External ear normal.  Mouth/Throat: Posterior oropharyngeal erythema present.  Eyes: Pupils are equal, round, and reactive to light. Right eye exhibits no discharge. Left eye exhibits no discharge.  Neck: Normal range of motion. Neck supple. No thyromegaly present.  Cardiovascular: Normal rate, regular rhythm, normal heart sounds and intact distal pulses.  No murmur heard. Pulmonary/Chest: Effort normal. No respiratory distress. He has wheezes.  Intermittent nonproductive cough   Abdominal: Soft. Bowel sounds are normal. He exhibits no distension. There is no tenderness.  Musculoskeletal: Normal range of motion. He exhibits no edema or tenderness.  Neurological: He is alert and oriented to  person, place, and time.  Skin: Skin is warm and dry. No rash noted. No erythema.  Small erythemas tender lesion on right outer ear   Psychiatric: He has a normal mood and affect. His behavior is normal. Judgment and thought content normal.  Vitals reviewed.     BP 128/80   Pulse 97   Temp 98.2 F (36.8 C) (Oral)   Ht 6' (1.829 m)   Wt 160 lb (72.6 kg)   SpO2 97%   BMI 21.70 kg/m      Assessment & Plan:  1. COPD with acute exacerbation (HCC) Continue Breo Mucinex  - doxycycline (VIBRA-TABS) 100 MG tablet; Take 1 tablet (100 mg total) by mouth 2 (two) times daily.  Dispense: 20 tablet; Refill: 0 - predniSONE (STERAPRED UNI-PAK 21 TAB) 10 MG (21) TBPK tablet; Use as directed  Dispense: 21 tablet; Refill: 0 - DG Chest 2 View; Future - fluticasone furoate-vilanterol (BREO ELLIPTA) 100-25 MCG/INH AEPB; Inhale 1 puff into the lungs daily.  Dispense: 1 each; Refill: 3  2. Moderate episode of recurrent major depressive disorder (HCC) - escitalopram (LEXAPRO) 20 MG tablet; Take 1 tablet (20 mg total) by mouth daily.  Dispense: 90 tablet; Refill: 1  3. GAD (generalized anxiety disorder) - escitalopram (LEXAPRO) 20 MG tablet; Take 1 tablet (20 mg total) by mouth daily.  Dispense: 90 tablet; Refill: 1  4. Epididymal cyst - Ambulatory referral to Urology  5. Testicle tenderness - Ambulatory referral to Urology  6. Skin lesion Do not pick or lay on this side of the ear - Ambulatory referral to Dermatology    Evelina Dun, FNP

## 2017-08-17 ENCOUNTER — Telehealth: Payer: Self-pay

## 2017-08-17 MED ORDER — TIOTROPIUM BROMIDE MONOHYDRATE 18 MCG IN CAPS: 18.0000 ug | ORAL_CAPSULE | Freq: Every day | RESPIRATORY_TRACT | 12 refills | Status: DC

## 2017-08-17 MED ORDER — BUDESONIDE-FORMOTEROL FUMARATE 160-4.5 MCG/ACT IN AERO: 2.0000 | INHALATION_SPRAY | Freq: Two times a day (BID) | RESPIRATORY_TRACT | 3 refills | Status: DC

## 2017-08-17 NOTE — Telephone Encounter (Signed)
Breo changed to Symbicort and Spiriva. Prescription sent to pharmacy

## 2017-08-17 NOTE — Telephone Encounter (Signed)
Patient aware.

## 2017-08-17 NOTE — Telephone Encounter (Signed)
Bradley Rice is non preferred with Medicaid   Preferred are Advair Diskus  Dulera inhaler and Symbicort inhaler

## 2017-09-13 ENCOUNTER — Other Ambulatory Visit: Payer: Self-pay | Admitting: *Deleted

## 2017-09-13 DIAGNOSIS — K224 Dyskinesia of esophagus: Secondary | ICD-10-CM

## 2017-09-13 MED ORDER — OMEPRAZOLE 20 MG PO CPDR: 20.0000 mg | DELAYED_RELEASE_CAPSULE | Freq: Every day | ORAL | 1 refills | Status: DC

## 2017-09-21 ENCOUNTER — Other Ambulatory Visit: Payer: Self-pay | Admitting: Family

## 2017-10-11 ENCOUNTER — Other Ambulatory Visit: Payer: Self-pay | Admitting: *Deleted

## 2017-10-11 DIAGNOSIS — I10 Essential (primary) hypertension: Secondary | ICD-10-CM

## 2017-10-11 MED ORDER — METOPROLOL SUCCINATE ER 50 MG PO TB24: 50.0000 mg | ORAL_TABLET | Freq: Every day | ORAL | 1 refills | Status: DC

## 2017-11-08 ENCOUNTER — Ambulatory Visit: Payer: Medicaid Other | Admitting: Urology

## 2017-11-08 DIAGNOSIS — N50811 Right testicular pain: Secondary | ICD-10-CM

## 2017-11-15 ENCOUNTER — Other Ambulatory Visit: Payer: Self-pay | Admitting: Urology

## 2017-11-15 DIAGNOSIS — N50811 Right testicular pain: Secondary | ICD-10-CM

## 2017-11-20 ENCOUNTER — Telehealth: Payer: Self-pay | Admitting: Family

## 2017-11-20 DIAGNOSIS — M4722 Other spondylosis with radiculopathy, cervical region: Secondary | ICD-10-CM

## 2017-11-21 NOTE — Telephone Encounter (Signed)
Referral placed.

## 2017-11-21 NOTE — Telephone Encounter (Signed)
Patient aware that referral has been placed to pain management

## 2017-12-04 ENCOUNTER — Ambulatory Visit (HOSPITAL_COMMUNITY): Admission: RE | Admit: 2017-12-04 | Payer: Medicaid Other | Source: Ambulatory Visit

## 2017-12-04 DIAGNOSIS — G8929 Other chronic pain: Secondary | ICD-10-CM | POA: Diagnosis not present

## 2017-12-04 DIAGNOSIS — M5412 Radiculopathy, cervical region: Secondary | ICD-10-CM | POA: Diagnosis not present

## 2017-12-04 DIAGNOSIS — M5416 Radiculopathy, lumbar region: Secondary | ICD-10-CM | POA: Diagnosis not present

## 2017-12-04 DIAGNOSIS — Z79899 Other long term (current) drug therapy: Secondary | ICD-10-CM | POA: Diagnosis not present

## 2017-12-11 ENCOUNTER — Encounter: Payer: Self-pay | Admitting: Family

## 2017-12-11 ENCOUNTER — Ambulatory Visit (INDEPENDENT_AMBULATORY_CARE_PROVIDER_SITE_OTHER): Payer: Medicaid Other | Admitting: Family

## 2017-12-11 VITALS — BP 129/80 | HR 88 | Temp 97.6°F | Ht 72.0 in | Wt 152.0 lb

## 2017-12-11 DIAGNOSIS — G894 Chronic pain syndrome: Secondary | ICD-10-CM | POA: Diagnosis not present

## 2017-12-11 DIAGNOSIS — R5383 Other fatigue: Secondary | ICD-10-CM

## 2017-12-11 DIAGNOSIS — F17200 Nicotine dependence, unspecified, uncomplicated: Secondary | ICD-10-CM | POA: Diagnosis not present

## 2017-12-11 NOTE — Progress Notes (Signed)
   Subjective:    Patient ID: Bradley Rice, male    DOB: 12-31-1960, 57 y.o.   MRN: [id number]  Chief Complaint  Patient presents with  . discuss lab results from another provider    HPI Pt is in the process of switching pain clinics and states he was told he needed to get CBC to rule out Leukemia.   PT has an appointment with [facility]  01/13/18. Pt states he is not going back to [facility] because he has an adverse reaction to some procedure he had there.   In the notes, I do not see any notes on this "procedure". Pt  has a history of elaborate stories without clinical evidence. See [facility] note on 11/15/17.    Review of Systems  Constitutional: Positive for fatigue.  Musculoskeletal: Positive for arthralgias and back pain.  All other systems reviewed and are negative.      Objective:   Physical Exam  Constitutional: He is oriented to person, place, and time. He appears well-developed and well-nourished. No distress.  HENT:  Head: Normocephalic.  Right Ear: External ear normal.  Left Ear: External ear normal.  Mouth/Throat: Oropharynx is clear and moist.  Eyes: Pupils are equal, round, and reactive to light. Right eye exhibits no discharge. Left eye exhibits no discharge.  Neck: Normal range of motion. Neck supple. No thyromegaly present.  Cardiovascular: Normal rate, regular rhythm, normal heart sounds and intact distal pulses.  No murmur heard. Pulmonary/Chest: Effort normal and breath sounds normal. No respiratory distress. He has no wheezes.  Abdominal: Soft. Bowel sounds are normal. He exhibits no distension. There is no tenderness.  Musculoskeletal: Normal range of motion. He exhibits no edema or tenderness.  Neurological: He is alert and oriented to person, place, and time. He has normal reflexes. No cranial nerve deficit.  Skin: Skin is warm and dry. No rash noted. No erythema.  Psychiatric: He has a normal mood and affect. His  behavior is normal. Judgment and thought content normal.  Vitals reviewed.     BP 129/80   Pulse 88   Temp 97.6 F (36.4 C) (Oral)   Ht 6' (1.829 m)   Wt 152 lb (68.9 kg)   BMI 20.61 kg/m      Assessment & Plan:  1. Fatigue, unspecified type - Anemia Profile B - CMP14+EGFR  2. Current smoker - Anemia Profile B - CMP14+EGFR  3. Chronic pain syndrome - Anemia Profile B - CMP14+EGFR  Labs pending Keep appt with Pain Clinic   Evelina Dun, FNP

## 2017-12-11 NOTE — Patient Instructions (Signed)

## 2017-12-12 LAB — ANEMIA PROFILE B
BASOS ABS: 0.1 10*3/uL (ref 0.0–0.2)
BASOS: 1 %
EOS (ABSOLUTE): 0.2 10*3/uL (ref 0.0–0.4)
Eos: 1 %
FOLATE: 9.3 ng/mL (ref >3.0)
Ferritin: 174 ng/mL (ref 30–400)
Hematocrit: 40.6 % (ref 37.5–51.0)
Hemoglobin: 13.6 g/dL (ref 13.0–17.7)
Immature Grans (Abs): 0.1 10*3/uL (ref 0.0–0.1)
Immature Granulocytes: 1 %
Iron Saturation: 20 % (ref 15–55)
Iron: 47 ug/dL (ref 38–169)
LYMPHS ABS: 3.2 10*3/uL — AB (ref 0.7–3.1)
Lymphs: 22 %
MCH: 31.8 pg (ref 26.6–33.0)
MCHC: 33.5 g/dL (ref 31.5–35.7)
MCV: 95 fL (ref 79–97)
Monocytes Absolute: 1.3 10*3/uL — ABNORMAL HIGH (ref 0.1–0.9)
Monocytes: 9 %
NEUTROS ABS: 9.8 10*3/uL — AB (ref 1.4–7.0)
Neutrophils: 66 %
PLATELETS: 358 10*3/uL (ref 150–379)
RBC: 4.28 x10E6/uL (ref 4.14–5.80)
RDW: 13.8 % (ref 12.3–15.4)
Retic Ct Pct: 1.2 % (ref 0.6–2.6)
TIBC: 232 ug/dL — AB (ref 250–450)
UIBC: 185 ug/dL (ref 111–343)
VITAMIN B 12: 361 pg/mL (ref 232–1245)
WBC: 14.7 10*3/uL — ABNORMAL HIGH (ref 3.4–10.8)

## 2017-12-12 LAB — CMP14+EGFR
ALBUMIN: 4 g/dL (ref 3.5–5.5)
ALT: 9 IU/L (ref 0–44)
AST: 13 IU/L (ref 0–40)
Albumin/Globulin Ratio: 1.4 (ref 1.2–2.2)
Alkaline Phosphatase: 85 IU/L (ref 39–117)
BILIRUBIN TOTAL: 0.5 mg/dL (ref 0.0–1.2)
BUN / CREAT RATIO: 6 — AB (ref 9–20)
BUN: 5 mg/dL — AB (ref 6–24)
CO2: 22 mmol/L (ref 20–29)
Calcium: 8.8 mg/dL (ref 8.7–10.2)
Chloride: 104 mmol/L (ref 96–106)
Creatinine, Ser: 0.84 mg/dL (ref 0.76–1.27)
GFR calc Af Amer: 114 mL/min/{1.73_m2} (ref >59)
GFR calc non Af Amer: 99 mL/min/{1.73_m2} (ref >59)
GLUCOSE: 114 mg/dL — AB (ref 65–99)
Globulin, Total: 2.8 g/dL (ref 1.5–4.5)
POTASSIUM: 3.7 mmol/L (ref 3.5–5.2)
Sodium: 140 mmol/L (ref 134–144)
Total Protein: 6.8 g/dL (ref 6.0–8.5)

## 2017-12-15 ENCOUNTER — Ambulatory Visit: Payer: Medicaid Other | Admitting: Urology

## 2018-01-24 ENCOUNTER — Encounter (HOSPITAL_COMMUNITY): Payer: Self-pay | Admitting: *Deleted

## 2018-01-24 ENCOUNTER — Emergency Department (HOSPITAL_COMMUNITY): Payer: Medicaid Other

## 2018-01-24 ENCOUNTER — Other Ambulatory Visit: Payer: Self-pay

## 2018-01-24 ENCOUNTER — Emergency Department (HOSPITAL_COMMUNITY)
Admission: EM | Admit: 2018-01-24 | Discharge: 2018-01-24 | Disposition: A | Discharge status: Home / Self Care | Payer: Medicaid Other | Attending: Emergency Medicine | Admitting: Emergency Medicine

## 2018-01-24 DIAGNOSIS — M542 Cervicalgia: Secondary | ICD-10-CM | POA: Insufficient documentation

## 2018-01-24 DIAGNOSIS — R079 Chest pain, unspecified: Secondary | ICD-10-CM | POA: Insufficient documentation

## 2018-01-24 DIAGNOSIS — Z79899 Other long term (current) drug therapy: Secondary | ICD-10-CM | POA: Diagnosis not present

## 2018-01-24 DIAGNOSIS — M25512 Pain in left shoulder: Secondary | ICD-10-CM | POA: Diagnosis present

## 2018-01-24 DIAGNOSIS — J449 Chronic obstructive pulmonary disease, unspecified: Secondary | ICD-10-CM | POA: Insufficient documentation

## 2018-01-24 DIAGNOSIS — I2510 Atherosclerotic heart disease of native coronary artery without angina pectoris: Secondary | ICD-10-CM | POA: Insufficient documentation

## 2018-01-24 DIAGNOSIS — Z87891 Personal history of nicotine dependence: Secondary | ICD-10-CM | POA: Diagnosis not present

## 2018-01-24 DIAGNOSIS — W11XXXA Fall on and from ladder, initial encounter: Secondary | ICD-10-CM | POA: Insufficient documentation

## 2018-01-24 DIAGNOSIS — I10 Essential (primary) hypertension: Secondary | ICD-10-CM | POA: Diagnosis not present

## 2018-01-24 DIAGNOSIS — G1221 Amyotrophic lateral sclerosis: Secondary | ICD-10-CM | POA: Diagnosis not present

## 2018-01-24 DIAGNOSIS — W19XXXA Unspecified fall, initial encounter: Secondary | ICD-10-CM

## 2018-01-24 LAB — CBC WITH DIFFERENTIAL/PLATELET
BASOS PCT: 1 %
Basophils Absolute: 0.2 10*3/uL — ABNORMAL HIGH (ref 0.0–0.1)
EOS ABS: 0.4 10*3/uL (ref 0.0–0.7)
EOS PCT: 4 %
HCT: 40.7 % (ref 39.0–52.0)
HEMOGLOBIN: 13.4 g/dL (ref 13.0–17.0)
LYMPHS ABS: 2.9 10*3/uL (ref 0.7–4.0)
Lymphocytes Relative: 28 %
MCH: 31.7 pg (ref 26.0–34.0)
MCHC: 32.9 g/dL (ref 30.0–36.0)
MCV: 96.2 fL (ref 78.0–100.0)
MONOS PCT: 9 %
Monocytes Absolute: 1 10*3/uL (ref 0.1–1.0)
Neutro Abs: 6 10*3/uL (ref 1.7–7.7)
Neutrophils Relative %: 58 %
PLATELETS: 260 10*3/uL (ref 150–400)
RBC: 4.23 MIL/uL (ref 4.22–5.81)
RDW: 13.3 % (ref 11.5–15.5)
WBC: 10.4 10*3/uL (ref 4.0–10.5)

## 2018-01-24 LAB — COMPREHENSIVE METABOLIC PANEL
ALK PHOS: 66 U/L (ref 38–126)
ALT: 13 U/L (ref 0–44)
ANION GAP: 7 (ref 5–15)
AST: 17 U/L (ref 15–41)
Albumin: 3.7 g/dL (ref 3.5–5.0)
BUN: 14 mg/dL (ref 6–20)
CALCIUM: 8.8 mg/dL — AB (ref 8.9–10.3)
CO2: 25 mmol/L (ref 22–32)
Chloride: 110 mmol/L (ref 98–111)
Creatinine, Ser: 0.72 mg/dL (ref 0.61–1.24)
GFR calc non Af Amer: >60 mL/min (ref >60)
Glucose, Bld: 114 mg/dL — ABNORMAL HIGH (ref 70–99)
POTASSIUM: 3.9 mmol/L (ref 3.5–5.1)
SODIUM: 142 mmol/L (ref 135–145)
Total Bilirubin: 0.4 mg/dL (ref 0.3–1.2)
Total Protein: 7.2 g/dL (ref 6.5–8.1)

## 2018-01-24 LAB — I-STAT CHEM 8, ED
BUN: 12 mg/dL (ref 6–20)
CALCIUM ION: 1.2 mmol/L (ref 1.15–1.40)
CHLORIDE: 107 mmol/L (ref 98–111)
Creatinine, Ser: 0.6 mg/dL — ABNORMAL LOW (ref 0.61–1.24)
GLUCOSE: 110 mg/dL — AB (ref 70–99)
HCT: 40 % (ref 39.0–52.0)
Hemoglobin: 13.6 g/dL (ref 13.0–17.0)
Potassium: 3.9 mmol/L (ref 3.5–5.1)
Sodium: 144 mmol/L (ref 135–145)
TCO2: 24 mmol/L (ref 22–32)

## 2018-01-24 MED ORDER — SODIUM CHLORIDE 0.9 % IV BOLUS
1000.0000 mL | Freq: Once | INTRAVENOUS | Status: AC
  Administered 2018-01-24: 1000 mL via INTRAVENOUS

## 2018-01-24 MED ORDER — IOPAMIDOL (ISOVUE-300) INJECTION 61%
100.0000 mL | Freq: Once | INTRAVENOUS | Status: AC | PRN
  Administered 2018-01-24: 100 mL via INTRAVENOUS

## 2018-01-24 MED ORDER — HYDROMORPHONE HCL 1 MG/ML IJ SOLN
0.5000 mg | Freq: Once | INTRAMUSCULAR | Status: AC
  Administered 2018-01-24: 0.5 mg via INTRAVENOUS
  Filled 2018-01-24: qty 1

## 2018-01-24 MED ORDER — HYDROCODONE-ACETAMINOPHEN 5-325 MG PO TABS: 1.0000 | ORAL_TABLET | Freq: Four times a day (QID) | ORAL | 0 refills | Status: DC | PRN

## 2018-01-24 NOTE — ED Triage Notes (Addendum)
Pt fell off a ladder when the ladder moved on pt while he was one story up from ground, pt c/o neck pain, left shoulder pain that radiates to left elbow area, positive left radial pulse, pt states that he hit his head as well, c-collar applied in triage,

## 2018-01-24 NOTE — ED Provider Notes (Signed)
[facility] EMERGENCY DEPARTMENT Provider Note   CSN: [id number] Arrival date & time: 01/24/18  1907     History   Chief Complaint Chief Complaint  Patient presents with  . Fall    HPI Bradley Rice is a 57 y.o. male.  Patient states that he fell off a ladder that was one-story up and he landed on his brother.  Patient complains of neck pain left shoulder pain and some left-sided chest pain.  No loss of consciousness  The history is provided by the patient. No language interpreter was used.  Fall  This is a new problem. The current episode started less than 1 hour ago. The problem occurs rarely. The problem has been resolved. Associated symptoms include chest pain. Pertinent negatives include no abdominal pain and no headaches. Exacerbated by: Movement. Nothing relieves the symptoms. He has tried nothing for the symptoms. The treatment provided no relief.    Past Medical History:  Diagnosis Date  . ALS (amyotrophic lateral sclerosis) ([place])   . Back pain   . Cancer ([place])    lung cancer  . COPD (chronic obstructive pulmonary disease) ([place])   . Coronary artery disease   . Depression   . Dysphagia    patient reports d/t surgery, has modified diet, has to tilt head to swallow   . History of blood transfusion 2014   post op bleed  . Migraine   . Spider bite 2012  . Stroke [facility])    11/2013    Patient Active Problem List   Diagnosis Date Noted  . Hypertension 09/30/2016  . ALS (amyotrophic lateral sclerosis) ([place])   . Lung nodule 08/24/2015  . [facility] disease [facility]) 02/17/2015  . GAD (generalized anxiety disorder) 02/17/2015  . Depression 02/17/2015  . Current smoker 02/17/2015  . Postlaminectomy syndrome, cervical region 08/15/2014  . Cervical dystonia 06/13/2014  . Cervicogenic headache 05/09/2014  . TIA (transient ischemic attack) 12/28/2013  . Cervical spondylosis with radiculopathy 12/17/2013  . ED (erectile dysfunction) 11/21/2013  . Multilevel spondylosis  06/25/2012  . 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 Carpal tunnel syndrome of left wrist 06/25/2012    Class: Chronic    Past Surgical History:  Procedure Laterality Date  . ANTERIOR CERVICAL DECOMP/DISCECTOMY FUSION  12/22/2011   Procedure: ANTERIOR CERVICAL DECOMPRESSION/DISCECTOMY FUSION 3 LEVELS;  Surgeon: Melina Schools, MD;  Location: [place];  Service: Orthopedics;  Laterality: N/A;  ACDF C5-T1  . ANTERIOR CERVICAL DECOMP/DISCECTOMY FUSION  06/25/2012   Procedure: ANTERIOR CERVICAL DECOMPRESSION/DISCECTOMY FUSION 1 LEVEL/HARDWARE REMOVAL;  Surgeon: [name], MD;  Location: [place];  Service: Orthopedics;  Laterality: N/A;  Removal anterior cervical plate C5-T1, Explore Fusion, Left C5-6, C6-7, C7-T1 Re-do Foraminotomy, Left open carpal tunnel release with release of ulnar nerve at [facility], Posterior Cervical Fusion with lateral mass screw  . BACK SURGERY    . CARDIAC CATHETERIZATION    . CARPAL TUNNEL RELEASE  06/25/2012   Procedure: CARPAL TUNNEL RELEASE;  Surgeon: [name], MD;  Location: [place];  Service: Orthopedics;  Laterality: Left;  as above  . CHOLECYSTECTOMY  2009  . CORONARY ANGIOPLASTY  2001   [facility]  . LUMBAR FUSION  2000  . POSTERIOR CERVICAL FUSION/FORAMINOTOMY  06/25/2012   Procedure: POSTERIOR CERVICAL FUSION/FORAMINOTOMY LEVEL 1;  Surgeon: [name], MD;  Location: [place];  Service: Orthopedics;  Laterality: N/A;  as above  . POSTERIOR CERVICAL FUSION/FORAMINOTOMY N/A 12/17/2013   Procedure: REMOVAL OF POSTERIOR CERVICAL FUSION LATERAL MASS  SCREWS AND RODS C5-T1, EXPLORE FUSION, LEFT SIX-SEVEN FORAMINOTOMY;  Surgeon: [name], MD;  Location: [place];  Service: Orthopedics;  Laterality: N/A;        Home Medications    Prior to Admission medications   Medication Sig Start Date End Date Taking? Authorizing Provider  albuterol (PROVENTIL HFA;VENTOLIN HFA) 108 (90 Base) MCG/ACT inhaler Inhale 2 puffs into the lungs every 6 (six) hours as needed  for wheezing or shortness of breath. 11/17/16  Yes Hawks, Christy A, FNP  albuterol (PROVENTIL) (2.5 MG/3ML) 0.083% nebulizer solution Take 3 mLs (2.5 mg total) by nebulization every 6 (six) hours as needed for wheezing or shortness of breath. 10/17/16  Yes Hawks, Christy A, FNP  escitalopram (LEXAPRO) 20 MG tablet Take 1 tablet (20 mg total) by mouth daily. 08/16/17  Yes Hawks, Christy A, FNP  methocarbamol (ROBAXIN) 500 MG tablet Take 1 tablet (500 mg total) every 6 (six) hours as needed by mouth for muscle spasms. 06/19/17  Yes Hawks, Christy A, FNP  omeprazole (PRILOSEC) 20 MG capsule Take 1 capsule (20 mg total) by mouth daily. 09/13/17  Yes Hawks, Christy A, FNP  HYDROcodone-acetaminophen (NORCO/VICODIN) 5-325 MG tablet Take 1 tablet by mouth every 6 (six) hours as needed for moderate pain. 01/24/18   Milton Ferguson, MD  metoprolol succinate (TOPROL-XL) 50 MG 24 hr tablet Take 1 tablet (50 mg total) by mouth daily. Patient not taking: Reported on 01/24/2018 10/11/17   Sharion Balloon, FNP    Family History Family History  Problem Relation Age of Onset  . ALS Mother   . Cancer Mother   . Cancer Sister   . Anesthesia problems Neg Hx     Social History Social History   Tobacco Use  . Smoking status: Former Smoker    Packs/day: 1.00    Years: 30.00    Pack years: 30.00    Types: Cigarettes    Last attempt to quit: 2013    Years since quitting: 6.4  . Smokeless tobacco: Never Used  Substance Use Topics  . Alcohol use: No    Alcohol/week: 0.0 oz  . Drug use: No     Allergies   Patient has no known allergies.   Review of Systems Review of Systems  Constitutional: Negative for appetite change and fatigue.  HENT: Negative for congestion, ear discharge and sinus pressure.   Eyes: Negative for discharge.  Respiratory: Negative for cough.   Cardiovascular: Positive for chest pain.  Gastrointestinal: Negative for abdominal pain and diarrhea.  Genitourinary: Negative for frequency  and hematuria.  Musculoskeletal: Negative for back pain.       Neck pain and left shoulder pain  Skin: Negative for rash.  Neurological: Negative for seizures and headaches.  Psychiatric/Behavioral: Negative for hallucinations.     Physical Exam Updated Vital Signs BP 135/81   Pulse 74   Temp 98 F (36.7 C)   Resp 10   Ht 6\' 1"  (1.854 m)   Wt 77.1 kg (170 lb)   SpO2 98%   BMI 22.43 kg/m   Physical Exam  Constitutional: He is oriented to person, place, and time. He appears well-developed.  HENT:  Head: Normocephalic.  Tenderness posterior neck  Eyes: Conjunctivae and EOM are normal. No scleral icterus.  Neck: Neck supple. No thyromegaly present.  Cardiovascular: Normal rate and regular rhythm. Exam reveals no gallop and no friction rub.  No murmur heard. Pulmonary/Chest: No stridor. He has no wheezes. He has no rales. He exhibits tenderness.  Abdominal: He exhibits no distension. There is no tenderness. There is no rebound.  Musculoskeletal: He exhibits no edema.  Tenderness to left shoulder  Lymphadenopathy:    He has no cervical adenopathy.  Neurological: He is oriented to person, place, and time. He exhibits normal muscle tone. Coordination normal.  Skin: No rash noted. No erythema.  Psychiatric: He has a normal mood and affect. His behavior is normal.     ED Treatments / Results  Labs (all labs ordered are listed, but only abnormal results are displayed) Labs Reviewed  CBC WITH DIFFERENTIAL/PLATELET - Abnormal; Notable for the following components:      Result Value   Basophils Absolute 0.2 (*)    All other components within normal limits  COMPREHENSIVE METABOLIC PANEL - Abnormal; Notable for the following components:   Glucose, Bld 114 (*)    Calcium 8.8 (*)    All other components within normal limits  I-STAT CHEM 8, ED - Abnormal; Notable for the following components:   Creatinine, Ser 0.60 (*)    Glucose, Bld 110 (*)    All other components within  normal limits    EKG None  Radiology Dg Forearm Left  Result Date: 01/24/2018 CLINICAL DATA:  Status post fall with left arm pain. EXAM: LEFT FOREARM - 2 VIEW COMPARISON:  None. FINDINGS: There is no evidence of fracture or other focal bone lesions. Soft tissues are unremarkable. IMPRESSION: Negative. Electronically Signed   By: Abelardo Diesel M.D.   On: 01/24/2018 20:44   Ct Head Wo Contrast  Result Date: 01/24/2018 CLINICAL DATA:  Fall from ladder now with left-sided neck pain. History of cervical fusion, stroke, lung cancer and COPD. EXAM: CT HEAD WITHOUT CONTRAST CT CERVICAL SPINE WITHOUT CONTRAST TECHNIQUE: Multidetector CT imaging of the head and cervical spine was performed following the standard protocol without intravenous contrast. Multiplanar CT image reconstructions of the cervical spine were also generated. COMPARISON:  CTA of the neck - 12/28/2013; Head CT - 04/13/2017 FINDINGS: CT HEAD FINDINGS Brain: Gray-white differentiation is maintained. No CT evidence of acute large territory infarct. No intraparenchymal or extra-axial mass or hemorrhage. Normal size and configuration of the ventricles and the basilar cisterns. No midline shift. Vascular: Minimal amount of intracranial atherosclerosis. Skull: No displaced calvarial fracture. Sinuses/Orbits: Air-fluid level seen within the left maxillary sinus, similar to the 04/2017 examination. The remaining paranasal sinuses and mastoid air cells remain well aerated. No air-fluid levels. Other: Regional soft tissues appear normal. _________________________________________________________ CT CERVICAL SPINE FINDINGS Alignment: C1 to the superior endplate of T3 is imaged. No anterolisthesis or retrolisthesis. The bilateral facets are normally aligned. The dens is normally positioned between the lateral masses of C1. Normal atlantodental and atlantoaxial articulations. Skull base and vertebrae: Cervical vertebral body heights are preserved. Prevertebral  soft tissues are normal. Soft tissues and spinal canal: Prevertebral soft tissues are normal. Disc levels: Post C5-C6, C6-C7 and C7-T1 intervertebral disc space replacement and bilateral facet fusion without evidence of hardware failure or loosening. Mild to moderate DDD of C3-C4 and C4-C5 with disc space height loss, endplate irregularity and primarily anteriorly directed disc osteophytosis at these locations. Upper chest: Limited visualization of lung apices demonstrates advanced biapical paraseptal emphysematous change and biapical pleuroparenchymal scarring. Other: No bulky cervical lymphadenopathy on this noncontrast examination. Normal noncontrast appearance of the thyroid gland. IMPRESSION: 1. Negative noncontrast head CT. 2. No fracture or static subluxation of the cervical spine. 3. Post C5-C6, C6-C7 and C7-T1 intervertebral disc space replacement without evidence of hardware  failure or loosening. 4. Mild to moderate DDD of C3-C4 and C4-C5. 5. Advanced emphysematous change within the imaged bilateral lung apices. Emphysema (ICD10-J43.9). Electronically Signed   By: Sandi Mariscal M.D.   On: 01/24/2018 20:39   Ct Chest W Contrast  Result Date: 01/24/2018 CLINICAL DATA:  Initial evaluation for acute trauma, fall. EXAM: CT CHEST, ABDOMEN, AND PELVIS WITH CONTRAST TECHNIQUE: Multidetector CT imaging of the chest, abdomen and pelvis was performed following the standard protocol during bolus administration of intravenous contrast. CONTRAST:  129mL ISOVUE-300 IOPAMIDOL (ISOVUE-300) INJECTION 61% COMPARISON:  Prior study from 04/21/2014. FINDINGS: CT CHEST FINDINGS Cardiovascular: Intrathoracic aorta of normal caliber without aneurysm or acute traumatic injury. Mild scattered plaque within the aortic arch. Partially visualized great vessels intact. Mild cardiomegaly. Scattered 3 vessel coronary artery calcifications. No pericardial effusion. Limited evaluation of the pulmonary arterial tree grossly unremarkable.  Mediastinum/Nodes: Thyroid within normal limits. 12 mm precarinal lymph node noted. 14 mm right hilar node. No other enlarged mediastinal, hilar, or axillary lymph nodes. No mediastinal hematoma. Esophagus within normal limits. Lungs/Pleura: Tracheobronchial tree intact. Small amount of layering secretions noted within the subglottic trachea. Basilar predominant fibrotic lung changes. No focal infiltrates or evidence for pulmonary contusion. No pulmonary edema or pleural effusion. No pneumothorax. 7 mm nodule present within the right middle lobe (series 4, image 96). 8 mm nodule at the peripheral right upper lobe (series 4, image 64). Approximate 1 cm spiculated nodular density at the right lung apex (series 4, image 32). 7 mm nodule present within the lingula (series 4, image 104). Additional left upper lobe nodules measure up to 1 cm (series 4, images 96, 82). Scattered left lower lobe nodules measure up to 6 mm (series 4, image 118). Musculoskeletal: External soft tissues demonstrate no acute finding. No acute fracture. No discrete lytic or blastic osseous lesions. Cervical ACDF partially visualized. CT ABDOMEN PELVIS FINDINGS Hepatobiliary: Liver demonstrates a normal contrast enhanced appearance. Gallbladder surgically absent. No biliary dilatation. Pancreas: Pancreas within normal limits. Spleen: Spleen intact and within normal limits. Adrenals/Urinary Tract: Adrenal glands are normal. Kidneys equal in size with symmetric enhancement. No nephrolithiasis, hydronephrosis, or focal enhancing renal mass. No hydroureter. Partially distended bladder within normal limits. Stomach/Bowel: Stomach within normal limits. No evidence for bowel obstruction or acute bowel injury. No acute inflammatory changes seen about the bowels. Mild sigmoid diverticulosis without evidence for acute diverticulitis. Vascular/Lymphatic: Moderate aorto bi-iliac atherosclerotic disease. No aneurysm or acute vascular injury. Mesenteric vessels  patent proximally. No adenopathy. Reproductive: Prostate enlarged measuring 5.4 cm in transverse diameter. Other: No free air or fluid. No mesenteric or retroperitoneal hematoma. Musculoskeletal: Prior PLIF at the L5-S1 level. No hyper complication. No acute fracture or other osseous abnormality. External soft tissues demonstrate no acute finding. IMPRESSION: 1. No CT evidence for acute traumatic injury within the chest, abdomen, and pelvis. 2. 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 3. Multiple scattered bilateral pulmonary nodules measuring up to 1 cm above, indeterminate. Non-contrast chest CT at 3-6 months is recommended. If the nodules are stable at time of repeat CT, then future CT at 18-24 months (from today's scan) is considered optional for low-risk patients, but is recommended for high-risk patients. This recommendation follows the consensus statement: Guidelines for Management of Incidental Pulmonary Nodules Detected on CT Images: From the Fleischner Society 2017; Radiology 2017; 284:228-243. 4. Mildly enlarged precarinal and right hilar lymph nodes, indeterminate, but may be reactive. Attention at follow-up recommended. 5. Moderate atherosclerosis. 6. Mild sigmoid diverticulosis  without evidence for acute diverticulitis. Electronically Signed   By: Jeannine Boga M.D.   On: 01/24/2018 20:56   Ct Cervical Spine Wo Contrast  Result Date: 01/24/2018 CLINICAL DATA:  Fall from ladder now with left-sided neck pain. History of cervical fusion, stroke, lung cancer and COPD. EXAM: CT HEAD WITHOUT CONTRAST CT CERVICAL SPINE WITHOUT CONTRAST TECHNIQUE: Multidetector CT imaging of the head and cervical spine was performed following the standard protocol without intravenous contrast. Multiplanar CT image reconstructions of the cervical spine were also generated. COMPARISON:  CTA of the neck - 12/28/2013; Head CT - 04/13/2017 FINDINGS: CT HEAD FINDINGS Brain: Gray-white  differentiation is maintained. No CT evidence of acute large territory infarct. No intraparenchymal or extra-axial mass or hemorrhage. Normal size and configuration of the ventricles and the basilar cisterns. No midline shift. Vascular: Minimal amount of intracranial atherosclerosis. Skull: No displaced calvarial fracture. Sinuses/Orbits: Air-fluid level seen within the left maxillary sinus, similar to the 04/2017 examination. The remaining paranasal sinuses and mastoid air cells remain well aerated. No air-fluid levels. Other: Regional soft tissues appear normal. _________________________________________________________ CT CERVICAL SPINE FINDINGS Alignment: C1 to the superior endplate of T3 is imaged. No anterolisthesis or retrolisthesis. The bilateral facets are normally aligned. The dens is normally positioned between the lateral masses of C1. Normal atlantodental and atlantoaxial articulations. Skull base and vertebrae: Cervical vertebral body heights are preserved. Prevertebral soft tissues are normal. Soft tissues and spinal canal: Prevertebral soft tissues are normal. Disc levels: Post C5-C6, C6-C7 and C7-T1 intervertebral disc space replacement and bilateral facet fusion without evidence of hardware failure or loosening. Mild to moderate DDD of C3-C4 and C4-C5 with disc space height loss, endplate irregularity and primarily anteriorly directed disc osteophytosis at these locations. Upper chest: Limited visualization of lung apices demonstrates advanced biapical paraseptal emphysematous change and biapical pleuroparenchymal scarring. Other: No bulky cervical lymphadenopathy on this noncontrast examination. Normal noncontrast appearance of the thyroid gland. IMPRESSION: 1. Negative noncontrast head CT. 2. No fracture or static subluxation of the cervical spine. 3. Post C5-C6, C6-C7 and C7-T1 intervertebral disc space replacement without evidence of hardware failure or loosening. 4. Mild to moderate DDD of  C3-C4 and C4-C5. 5. Advanced emphysematous change within the imaged bilateral lung apices. Emphysema (ICD10-J43.9). Electronically Signed   By: Sandi Mariscal M.D.   On: 01/24/2018 20:39   Ct Abdomen Pelvis W Contrast  Result Date: 01/24/2018 CLINICAL DATA:  Initial evaluation for acute trauma, fall. EXAM: CT CHEST, ABDOMEN, AND PELVIS WITH CONTRAST TECHNIQUE: Multidetector CT imaging of the chest, abdomen and pelvis was performed following the standard protocol during bolus administration of intravenous contrast. CONTRAST:  177mL ISOVUE-300 IOPAMIDOL (ISOVUE-300) INJECTION 61% COMPARISON:  Prior study from 04/21/2014. FINDINGS: CT CHEST FINDINGS Cardiovascular: Intrathoracic aorta of normal caliber without aneurysm or acute traumatic injury. Mild scattered plaque within the aortic arch. Partially visualized great vessels intact. Mild cardiomegaly. Scattered 3 vessel coronary artery calcifications. No pericardial effusion. Limited evaluation of the pulmonary arterial tree grossly unremarkable. Mediastinum/Nodes: Thyroid within normal limits. 12 mm precarinal lymph node noted. 14 mm right hilar node. No other enlarged mediastinal, hilar, or axillary lymph nodes. No mediastinal hematoma. Esophagus within normal limits. Lungs/Pleura: Tracheobronchial tree intact. Small amount of layering secretions noted within the subglottic trachea. Basilar predominant fibrotic lung changes. No focal infiltrates or evidence for pulmonary contusion. No pulmonary edema or pleural effusion. No pneumothorax. 7 mm nodule present within the right middle lobe (series 4, image 96). 8 mm nodule at the peripheral right upper  lobe (series 4, image 64). Approximate 1 cm spiculated nodular density at the right lung apex (series 4, image 32). 7 mm nodule present within the lingula (series 4, image 104). Additional left upper lobe nodules measure up to 1 cm (series 4, images 96, 82). Scattered left lower lobe nodules measure up to 6 mm (series 4,  image 118). Musculoskeletal: External soft tissues demonstrate no acute finding. No acute fracture. No discrete lytic or blastic osseous lesions. Cervical ACDF partially visualized. CT ABDOMEN PELVIS FINDINGS Hepatobiliary: Liver demonstrates a normal contrast enhanced appearance. Gallbladder surgically absent. No biliary dilatation. Pancreas: Pancreas within normal limits. Spleen: Spleen intact and within normal limits. Adrenals/Urinary Tract: Adrenal glands are normal. Kidneys equal in size with symmetric enhancement. No nephrolithiasis, hydronephrosis, or focal enhancing renal mass. No hydroureter. Partially distended bladder within normal limits. Stomach/Bowel: Stomach within normal limits. No evidence for bowel obstruction or acute bowel injury. No acute inflammatory changes seen about the bowels. Mild sigmoid diverticulosis without evidence for acute diverticulitis. Vascular/Lymphatic: Moderate aorto bi-iliac atherosclerotic disease. No aneurysm or acute vascular injury. Mesenteric vessels patent proximally. No adenopathy. Reproductive: Prostate enlarged measuring 5.4 cm in transverse diameter. Other: No free air or fluid. No mesenteric or retroperitoneal hematoma. Musculoskeletal: Prior PLIF at the L5-S1 level. No hyper complication. No acute fracture or other osseous abnormality. External soft tissues demonstrate no acute finding. IMPRESSION: 1. No CT evidence for acute traumatic injury within the chest, abdomen, and pelvis. 2. 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 3. Multiple scattered bilateral pulmonary nodules measuring up to 1 cm above, indeterminate. Non-contrast chest CT at 3-6 months is recommended. If the nodules are stable at time of repeat CT, then future CT at 18-24 months (from today's scan) is considered optional for low-risk patients, but is recommended for high-risk patients. This recommendation follows the consensus statement: Guidelines for  Management of Incidental Pulmonary Nodules Detected on CT Images: From the Fleischner Society 2017; Radiology 2017; 284:228-243. 4. Mildly enlarged precarinal and right hilar lymph nodes, indeterminate, but may be reactive. Attention at follow-up recommended. 5. Moderate atherosclerosis. 6. Mild sigmoid diverticulosis without evidence for acute diverticulitis. Electronically Signed   By: Jeannine Boga M.D.   On: 01/24/2018 20:56   Dg Shoulder Left  Result Date: 01/24/2018 CLINICAL DATA:  Status post fall with left shoulder pain. EXAM: LEFT SHOULDER - 2+ VIEW COMPARISON:  None. FINDINGS: There is no evidence of fracture or dislocation. Soft tissues are unremarkable. IMPRESSION: No acute fracture or dislocation. Electronically Signed   By: Abelardo Diesel M.D.   On: 01/24/2018 20:42   Dg Humerus Left  Result Date: 01/24/2018 CLINICAL DATA:  Status post fall with left shoulder pain. EXAM: LEFT HUMERUS - 2+ VIEW COMPARISON:  None. FINDINGS: There is no evidence of fracture or dislocation. Soft tissues are unremarkable. IMPRESSION: No acute fracture or dislocation. Electronically Signed   By: Abelardo Diesel M.D.   On: 01/24/2018 20:43    Procedures Procedures (including critical care time)  Medications Ordered in ED Medications  HYDROmorphone (DILAUDID) injection 0.5 mg (has no administration in time range)  sodium chloride 0.9 % bolus 1,000 mL (0 mLs Intravenous Stopped 01/24/18 2151)  iopamidol (ISOVUE-300) 61 % injection 100 mL (100 mLs Intravenous Contrast Given 01/24/18 1952)     Initial Impression / Assessment and Plan / ED Course  I have reviewed the triage vital signs and the nursing notes.  Pertinent labs & imaging results that were available during my care of the patient  were reviewed by me and considered in my medical decision making (see chart for details).    CRITICAL CARE Performed by: Milton Ferguson Total critical care time: 35 minutes Critical care time was exclusive of  separately billable procedures and treating other patients. Critical care was necessary to treat or prevent imminent or life-threatening deterioration. Critical care was time spent personally by me on the following activities: development of treatment plan with patient and/or surrogate as well as nursing, discussions with consultants, evaluation of patient's response to treatment, examination of patient, obtaining history from patient or surrogate, ordering and performing treatments and interventions, ordering and review of laboratory studies, ordering and review of radiographic studies, pulse oximetry and re-evaluation of patient's condition.  Patient with a fall off of a ladder.  Patient had a CT of his head neck chest and abdomen which were unremarkable and x-rays of his left arm which showed no fractures.  Patient has significant pain in that left shoulder and will be sent home with pain medicine and a sling.  He has been referred to orthopedics for follow-up.  Patient was also instructed to get rechecked in 3 months to repeat CT of his chest because he had some scarring that needs to be looked at again   Final Clinical Impressions(s) / ED Diagnoses   Final diagnoses:  Fall, initial encounter    ED Discharge Orders        Ordered    HYDROcodone-acetaminophen (NORCO/VICODIN) 5-325 MG tablet  Every 6 hours PRN     01/24/18 2225       Milton Ferguson, MD 01/24/18 2229

## 2018-01-24 NOTE — Discharge Instructions (Addendum)
Follow-up with Dr. Aline Brochure next week for your left arm and your neck injuries.

## 2018-01-24 NOTE — ED Notes (Signed)
Pt left with family driving

## 2018-01-26 ENCOUNTER — Telehealth: Payer: Self-pay | Admitting: Orthopedic Surgery

## 2018-01-26 NOTE — Telephone Encounter (Signed)
Patient called stating he has been seen at the ER for neck pain.  She said he was referred to Dr. Aline Brochure.  I told him that since he has Medicaid [place], we would need to get approval for him to be seen in our office.  He then proceeded to tell me about his previous surgeries regarding his neck.  I told him to ask his PCP whether they think he should go back to the previous doctor that did his surgery or come here.  I did let him know that our doctor's dont do neck fusions or neck cages.  He understood.  He will call back.

## 2018-03-15 ENCOUNTER — Encounter: Payer: Self-pay | Admitting: Family

## 2018-03-15 ENCOUNTER — Ambulatory Visit: Payer: Medicaid Other | Admitting: Family

## 2018-03-15 VITALS — BP 99/63 | HR 91 | Temp 97.7°F | Ht 73.0 in | Wt 156.0 lb

## 2018-03-15 DIAGNOSIS — M4722 Other spondylosis with radiculopathy, cervical region: Secondary | ICD-10-CM | POA: Diagnosis not present

## 2018-03-15 DIAGNOSIS — G8929 Other chronic pain: Secondary | ICD-10-CM | POA: Diagnosis not present

## 2018-03-15 DIAGNOSIS — M960 Pseudarthrosis after fusion or arthrodesis: Secondary | ICD-10-CM

## 2018-03-15 DIAGNOSIS — F1193 Opioid use, unspecified with withdrawal: Secondary | ICD-10-CM

## 2018-03-15 DIAGNOSIS — F1123 Opioid dependence with withdrawal: Secondary | ICD-10-CM

## 2018-03-15 MED ORDER — ONDANSETRON 4 MG PO TBDP: 4.0000 mg | ORAL_TABLET | Freq: Three times a day (TID) | ORAL | 2 refills | Status: DC | PRN

## 2018-03-15 MED ORDER — CLONIDINE HCL 0.1 MG PO TABS: ORAL_TABLET | ORAL | 3 refills | Status: DC

## 2018-03-15 MED ORDER — BACLOFEN 10 MG PO TABS: 10.0000 mg | ORAL_TABLET | Freq: Three times a day (TID) | ORAL | 0 refills | Status: DC

## 2018-03-15 MED ORDER — DIPHENOXYLATE-ATROPINE 2.5-0.025 MG PO TABS: 1.0000 | ORAL_TABLET | Freq: Four times a day (QID) | ORAL | 0 refills | Status: DC | PRN

## 2018-03-15 NOTE — Patient Instructions (Signed)

## 2018-03-15 NOTE — Addendum Note (Signed)
Addended by: Evelina Dun A on: 03/15/2018 12:17 PM   Modules accepted: Orders, Level of Service

## 2018-03-15 NOTE — Progress Notes (Addendum)
Subjective:    Patient ID: Bradley Rice, male    DOB: May 01, 1961, 57 y.o.   MRN: [id number]  Chief Complaint  Patient presents with  . discuss pain medication cessation    HPI PT presents to the office today to discuss stopping his pain medication. Pt states he was followed by [facility], but states last month he did not keep his appointment because he feels like his pain medication is not working.   He states he fell off a ladder about two months ago and went to the ED. He states he has increased pain in his right shoulder and neck pain.   He states he is in constant pain of 10 out 10. States he has not had any pain medication in two days.   *After, reviewing patients chart in Epic, it looks like Bradley Rice stopped his pain medication. "After consultation with his other providers, there is no documentation of him having any sorts of cancer or ALS. I have spoken with these physicians personally and I am choosing to discontinue his medications. In the last note from his PCP he claimed adverse reactions to recent procedure. "   Review of Systems  All other systems reviewed and are negative.      Objective:   Physical Exam  Constitutional: He is oriented to person, place, and time. He appears well-developed and well-nourished. No distress.  HENT:  Head: Normocephalic.  Right Ear: External ear normal.  Left Ear: External ear normal.  Mouth/Throat: Oropharynx is clear and moist.  Eyes: Pupils are equal, round, and reactive to light. Right eye exhibits no discharge. Left eye exhibits no discharge.  Neck: Normal range of motion. Neck supple. No thyromegaly present.  Cardiovascular: Normal rate, regular rhythm, normal heart sounds and intact distal pulses.  No murmur heard. Pulmonary/Chest: Effort normal and breath sounds normal. No respiratory distress. He has no wheezes.  Abdominal: Soft. Bowel sounds are normal. He exhibits no distension. There is no tenderness.    Musculoskeletal: He exhibits no edema or tenderness.  Pain in neck with rotation and flexion, pain in lower back with flexion  Neurological: He is alert and oriented to person, place, and time. He has normal reflexes. No cranial nerve deficit.  Skin: Skin is warm and dry. No rash noted. No erythema.  Psychiatric: He has a normal mood and affect. His behavior is normal. Judgment and thought content normal.  Vitals reviewed.   BP 99/63   Pulse 91   Temp 97.7 F (36.5 C) (Oral)   Ht 6\' 1"  (1.854 m)   Wt 156 lb (70.8 kg)   BMI 20.58 kg/m      Assessment & Plan:  Bradley Rice comes in today with chief complaint of discuss pain medication cessation   Diagnosis and orders addressed:  1. Cervical spondylosis with radiculopathy - Ambulatory referral to Pain Clinic  2. Failed cervical fusion - Ambulatory referral to Pain Clinic  3. Other chronic pain - Ambulatory referral to Pain Clinic  4. Opioid withdrawal (HCC) - ondansetron (ZOFRAN ODT) 4 MG disintegrating tablet; Take 1 tablet (4 mg total) by mouth every 8 (eight) hours as needed for nausea or vomiting.  Dispense: 30 tablet; Refill: 2 - cloNIDine (CATAPRES) 0.1 MG tablet; Take 0.1 mg every 8 hours for withdrawal symptoms  Dispense: 90 tablet; Refill: 3 - diphenoxylate-atropine (LOMOTIL) 2.5-0.025 MG tablet; Take 1 tablet by mouth 4 (four) times daily as needed for diarrhea or loose stools.  Dispense: 30  tablet; Refill: 0 - baclofen (LIORESAL) 10 MG tablet; Take 1 tablet (10 mg total) by mouth 3 (three) times daily.  Dispense: 30 each; Refill: 0   I will order another referral to Pain Clinic, however, pt has been to several and has been dismissed Discussed I could not write controlled medication today Greater than 30 mins spent with patient about withdrawal symptoms  RTO if symptoms worsen or do not improve   Evelina Dun, FNP

## 2018-03-27 ENCOUNTER — Telehealth: Payer: Self-pay | Admitting: Family

## 2018-04-10 DIAGNOSIS — M5412 Radiculopathy, cervical region: Secondary | ICD-10-CM | POA: Diagnosis not present

## 2018-04-10 DIAGNOSIS — Z79891 Long term (current) use of opiate analgesic: Secondary | ICD-10-CM | POA: Diagnosis not present

## 2018-04-10 DIAGNOSIS — M5416 Radiculopathy, lumbar region: Secondary | ICD-10-CM | POA: Diagnosis not present

## 2018-04-10 DIAGNOSIS — G8929 Other chronic pain: Secondary | ICD-10-CM | POA: Diagnosis not present

## 2018-04-10 DIAGNOSIS — G894 Chronic pain syndrome: Secondary | ICD-10-CM | POA: Diagnosis not present

## 2018-04-20 ENCOUNTER — Other Ambulatory Visit: Payer: Self-pay | Admitting: Family

## 2018-04-20 DIAGNOSIS — M4722 Other spondylosis with radiculopathy, cervical region: Secondary | ICD-10-CM

## 2018-04-20 DIAGNOSIS — G8929 Other chronic pain: Secondary | ICD-10-CM

## 2018-04-20 DIAGNOSIS — M960 Pseudarthrosis after fusion or arthrodesis: Secondary | ICD-10-CM

## 2018-04-20 DIAGNOSIS — R51 Headache: Secondary | ICD-10-CM

## 2018-04-20 DIAGNOSIS — G4486 Cervicogenic headache: Secondary | ICD-10-CM

## 2018-04-30 ENCOUNTER — Ambulatory Visit: Payer: Medicaid Other | Admitting: Family

## 2018-04-30 ENCOUNTER — Encounter: Payer: Self-pay | Admitting: Family

## 2018-04-30 ENCOUNTER — Telehealth: Payer: Self-pay | Admitting: Family

## 2018-04-30 VITALS — BP 127/79 | HR 99 | Temp 97.4°F | Ht 73.0 in | Wt 150.6 lb

## 2018-04-30 DIAGNOSIS — G8929 Other chronic pain: Secondary | ICD-10-CM

## 2018-04-30 DIAGNOSIS — M549 Dorsalgia, unspecified: Secondary | ICD-10-CM

## 2018-04-30 DIAGNOSIS — F17200 Nicotine dependence, unspecified, uncomplicated: Secondary | ICD-10-CM

## 2018-04-30 DIAGNOSIS — R059 Cough, unspecified: Secondary | ICD-10-CM

## 2018-04-30 DIAGNOSIS — R05 Cough: Secondary | ICD-10-CM | POA: Diagnosis not present

## 2018-04-30 DIAGNOSIS — R634 Abnormal weight loss: Secondary | ICD-10-CM

## 2018-04-30 DIAGNOSIS — M960 Pseudarthrosis after fusion or arthrodesis: Secondary | ICD-10-CM

## 2018-04-30 DIAGNOSIS — R918 Other nonspecific abnormal finding of lung field: Secondary | ICD-10-CM

## 2018-04-30 DIAGNOSIS — M545 Low back pain: Secondary | ICD-10-CM

## 2018-04-30 DIAGNOSIS — Z23 Encounter for immunization: Secondary | ICD-10-CM

## 2018-04-30 MED ORDER — BOOST HIGH PROTEIN PO POWD: 531.0000 g | Freq: Two times a day (BID) | ORAL | 3 refills | Status: DC

## 2018-04-30 MED ORDER — BENZONATATE 200 MG PO CAPS: 200.0000 mg | ORAL_CAPSULE | Freq: Three times a day (TID) | ORAL | 1 refills | Status: DC | PRN

## 2018-04-30 MED ORDER — ENSURE HIGH PROTEIN PO LIQD: 237.0000 mL | Freq: Two times a day (BID) | ORAL | 6 refills | Status: DC

## 2018-04-30 NOTE — Patient Instructions (Signed)
Cough, Adult  Coughing is a reflex that clears your throat and your airways. Coughing helps to heal and protect your lungs. It is normal to cough occasionally, but a cough that happens with other symptoms or lasts a long time may be a sign of a condition that needs treatment. A cough may last only 2-3 weeks (acute), or it may last longer than 8 weeks (chronic).  What are the causes?  Coughing is commonly caused by:   Breathing in substances that irritate your lungs.   A viral or bacterial respiratory infection.   Allergies.   Asthma.   Postnasal drip.   Smoking.   Acid backing up from the stomach into the esophagus (gastroesophageal reflux).   Certain medicines.   Chronic lung problems, including COPD (or rarely, lung cancer).   Other medical conditions such as heart failure.    Follow these instructions at home:  Pay attention to any changes in your symptoms. Take these actions to help with your discomfort:   Take medicines only as told by your health care provider.  ? If you were prescribed an antibiotic medicine, take it as told by your health care provider. Do not stop taking the antibiotic even if you start to feel better.  ? Talk with your health care provider before you take a cough suppressant medicine.   Drink enough fluid to keep your urine clear or pale yellow.   If the air is dry, use a cold steam vaporizer or humidifier in your bedroom or your home to help loosen secretions.   Avoid anything that causes you to cough at work or at home.   If your cough is worse at night, try sleeping in a semi-upright position.   Avoid cigarette smoke. If you smoke, quit smoking. If you need help quitting, ask your health care provider.   Avoid caffeine.   Avoid alcohol.   Rest as needed.    Contact a health care provider if:   You have new symptoms.   You cough up pus.   Your cough does not get better after 2-3 weeks, or your cough gets worse.   You cannot control your cough with suppressant  medicines and you are losing sleep.   You develop pain that is getting worse or pain that is not controlled with pain medicines.   You have a fever.   You have unexplained weight loss.   You have night sweats.  Get help right away if:   You cough up blood.   You have difficulty breathing.   Your heartbeat is very fast.  This information is not intended to replace advice given to you by your health care provider. Make sure you discuss any questions you have with your health care provider.  Document Released: 01/14/2011 Document Revised: 12/24/2015 Document Reviewed: 09/24/2014  [employer]vier Interactive Patient Education  2018 [employer]

## 2018-04-30 NOTE — Telephone Encounter (Signed)
Prescription sent to pharmacy.

## 2018-04-30 NOTE — Progress Notes (Addendum)
Subjective:    Patient ID: Bradley Rice, male    DOB: [birth year], 57 y.o.   MRN: [id number]  Chief Complaint  Patient presents with  . referral to a surgeon    HPI PT presents to the office today for referral to Neurosurgeon. He had a failed cervical fusion 2015 and lower lumbar fusion 2001. He states he has chronic back and neck pain of constant pain of 8-9 out 10. He is currently followed by Pain Clinic every 2 weeks.  He states he is losing about 2 lbs a week. He has lost 6 lbs since his last visit on 03/15/18. He states he has been trying drink boost with his meals.   HE reports he continues to cough and his cough is worse at night. He states at times he will cough so much he will throw up.    Review of Systems  Musculoskeletal: Positive for arthralgias.  All other systems reviewed and are negative.      Objective:   Physical Exam  Constitutional: He is oriented to person, place, and time. He appears well-developed and well-nourished. No distress.  HENT:  Head: Normocephalic.  Right Ear: External ear normal.  Left Ear: External ear normal.  Mouth/Throat: Oropharynx is clear and moist.  Eyes: Pupils are equal, round, and reactive to light. Right eye exhibits no discharge. Left eye exhibits no discharge.  Neck: Normal range of motion. Neck supple. No thyromegaly present.  Cardiovascular: Normal rate, regular rhythm, normal heart sounds and intact distal pulses.  No murmur heard. Pulmonary/Chest: Effort normal and breath sounds normal. No respiratory distress. He has no wheezes.  Abdominal: Soft. Bowel sounds are normal. He exhibits no distension. There is no tenderness.  Musculoskeletal: He exhibits no edema or tenderness.  Pain in neck and lower lumbar with flexion or extension  Neurological: He is alert and oriented to person, place, and time. He has normal reflexes. No cranial nerve deficit.  Skin: Skin is warm and dry. No rash noted. No erythema.  Psychiatric: He has a  normal mood and affect. His behavior is normal. Judgment and thought content normal.  Vitals reviewed.     BP 127/79   Pulse 99   Temp (!) 97.4 F (36.3 C) (Oral)   Ht 6\' 1"  (1.854 m)   Wt 150 lb 9.6 oz (68.3 kg)   BMI 19.87 kg/m      Assessment & Plan:  Bradley Rice comes in today with chief complaint of referral to a surgeon   Diagnosis and orders addressed:  1. Other chronic pain - Nutritional Supplements (BOOST HIGH PROTEIN) POWD; Take 531 g by mouth 2 (two) times daily.  Dispense: 12 Can; Refill: 3  2. Lung nodules - CT Chest Wo Contrast; Future - Nutritional Supplements (BOOST HIGH PROTEIN) POWD; Take 531 g by mouth 2 (two) times daily.  Dispense: 12 Can; Refill: 3  3. Cough - CT Chest Wo Contrast; Future - benzonatate (TESSALON) 200 MG capsule; Take 1 capsule (200 mg total) by mouth 3 (three) times daily as needed.  Dispense: 90 capsule; Refill: 1 - Nutritional Supplements (BOOST HIGH PROTEIN) POWD; Take 531 g by mouth 2 (two) times daily.  Dispense: 12 Can; Refill: 3  4. Current smoker  5. Failed cervical fusion  6. Chronic bilateral low back pain, with sciatica presence unspecified  7. Weight loss, unintentional - Nutritional Supplements (BOOST HIGH PROTEIN) POWD; Take 531 g by mouth 2 (two) times daily.  Dispense: 12 Can; Refill: 3  Referral still pending for Dr. Carloyn Manner We will repeat CT scan, worrisome for malignancy with weight loss and lung nodules Keep follow up with Pain Clinic     Evelina Dun, FNP

## 2018-05-01 ENCOUNTER — Other Ambulatory Visit: Payer: Self-pay | Admitting: Family

## 2018-05-01 DIAGNOSIS — R634 Abnormal weight loss: Secondary | ICD-10-CM

## 2018-05-01 DIAGNOSIS — F17200 Nicotine dependence, unspecified, uncomplicated: Secondary | ICD-10-CM

## 2018-05-01 DIAGNOSIS — R911 Solitary pulmonary nodule: Secondary | ICD-10-CM

## 2018-05-01 MED ORDER — ENSURE HIGH PROTEIN PO LIQD
237.0000 mL | Freq: Two times a day (BID) | ORAL | 5 refills | Status: DC
Start: 2018-05-01 — End: 2019-01-17

## 2018-05-02 ENCOUNTER — Encounter: Payer: Self-pay | Admitting: *Deleted

## 2018-05-02 ENCOUNTER — Ambulatory Visit: Payer: Medicaid Other | Admitting: Neurology

## 2018-05-02 ENCOUNTER — Ambulatory Visit: Payer: Medicaid Other | Admitting: Diagnostic Neuroimaging

## 2018-05-02 VITALS — BP 121/74 | HR 102 | Ht 72.0 in | Wt 150.0 lb

## 2018-05-02 DIAGNOSIS — M545 Low back pain, unspecified: Secondary | ICD-10-CM

## 2018-05-02 DIAGNOSIS — M542 Cervicalgia: Secondary | ICD-10-CM

## 2018-05-02 DIAGNOSIS — R634 Abnormal weight loss: Secondary | ICD-10-CM | POA: Diagnosis not present

## 2018-05-02 NOTE — Progress Notes (Signed)
[facility]  PATIENT: Bradley Rice DOB: 03-19-1961  REFERRING CLINICIAN: Hurshel Party HISTORY FROM: patient  REASON FOR VISIT: new consult / existing patient   HISTORICAL  CHIEF COMPLAINT:  Chief Complaint  Patient presents with  . ALS    rm 7, New Pt    HISTORY OF PRESENT ILLNESS:   UPDATE (05/02/18, VRP): Since last visit, referred her for ALS evaluation. He states that his mother and brother have ALS, but do not know which neurologists they saw. Apparently was told he may have ALS in 2016, but not sure of which doctor, what testing, or where this was done.  Patient saw a neurologist in 2018 for ALS evaluation  (Dr. William Hamburger) who reviewed the information and stated that patient did not likely have ALS.  Patient was offered EMG electrical testing but patient did not follow-up.  Patient continues with pain, muscle spasms.  Symptoms are severe. No alleviating or aggravating factors. Patient having 2 pound per month unintentional weight loss.  Patient has been diagnosed with "spots on the lungs" but patient does not want to proceed with biopsy or other testing.    PRIOR HPI (05/09/14): 57 year old left-handed male with multiple neck and low back surgeries, here for evaluation of headaches.  Patient had last neck surgery 12/17/2013. Following this patient developed onset of right-sided headaches, spreading to the left side, with associated neck pain, left arm numbness and left arm weakness. No nausea vomiting. Patient has pre-existing photosensitivity which significantly worsened in May/June 2015. Patient has 2-3 severe headaches per day up to 2-3 hours that time. Patient has tried topiramate, lidocaine nasal spray, verapamil 80 mg 3 times a day without relief. He has seen another neurologist/headache specialist in the past.  Patient also having significant sleep problems, insomnia, frequent waking, as well as significant depression, poor energy, disinterest in  activities.   REVIEW OF SYSTEMS: Full 14 system review of systems performed and negative except: Restless legs memory loss weakness difficulty swallowing passing out joint pain shortness of breath chest pain swelling in legs weight loss trouble swallowing easy bleeding.  ALLERGIES: No Known Allergies  HOME MEDICATIONS: Outpatient Medications Prior to Visit  Medication Sig Dispense Refill  . albuterol (PROVENTIL HFA;VENTOLIN HFA) 108 (90 Base) MCG/ACT inhaler Inhale 2 puffs into the lungs every 6 (six) hours as needed for wheezing or shortness of breath. 1 Inhaler 5  . albuterol (PROVENTIL) (2.5 MG/3ML) 0.083% nebulizer solution Take 3 mLs (2.5 mg total) by nebulization every 6 (six) hours as needed for wheezing or shortness of breath. 150 mL 1  . baclofen (LIORESAL) 10 MG tablet Take 1 tablet (10 mg total) by mouth 3 (three) times daily. 30 each 0  . benzonatate (TESSALON) 200 MG capsule Take 1 capsule (200 mg total) by mouth 3 (three) times daily as needed. 90 capsule 1  . budesonide-formoterol (SYMBICORT) 80-4.5 MCG/ACT inhaler Inhale into the lungs.    . cloNIDine (CATAPRES) 0.1 MG tablet Take 0.1 mg every 8 hours for withdrawal symptoms 90 tablet 3  . diphenoxylate-atropine (LOMOTIL) 2.5-0.025 MG tablet Take 1 tablet by mouth 4 (four) times daily as needed for diarrhea or loose stools. 30 tablet 0  . escitalopram (LEXAPRO) 20 MG tablet Take 1 tablet (20 mg total) by mouth daily. 90 tablet 1  . fluticasone furoate-vilanterol (BREO ELLIPTA) 100-25 MCG/INH AEPB Inhale into the lungs.    . methocarbamol (ROBAXIN) 750 MG tablet Take 750 mg by mouth 2 (two) times daily as needed.  0  .  metoprolol succinate (TOPROL-XL) 50 MG 24 hr tablet Take 1 tablet (50 mg total) by mouth daily. 90 tablet 1  . Nutritional Supplements (ENSURE HIGH PROTEIN) LIQD Take 237 mLs by mouth 2 (two) times daily. 60 Can 5  . omeprazole (PRILOSEC) 20 MG capsule Take 1 capsule (20 mg total) by mouth daily. 90 capsule 1  .  ondansetron (ZOFRAN ODT) 4 MG disintegrating tablet Take 1 tablet (4 mg total) by mouth every 8 (eight) hours as needed for nausea or vomiting. 30 tablet 2  . oxyCODONE-acetaminophen (PERCOCET) 10-325 MG tablet TAKE ONE TABLET BY MOUTH FIVE TIMES A DAY.  0  . sildenafil (REVATIO) 20 MG tablet Take 2-5 tabs PRN prior to sexual activity.    Marland Kitchen topiramate (TOPAMAX) 50 MG tablet Take 2 tabs BID     No facility-administered medications prior to visit.     PAST MEDICAL HISTORY: Past Medical History:  Diagnosis Date  . ALS (amyotrophic lateral sclerosis) ([place])   . Back pain   . Cancer ([place])    lung cancer  . COPD (chronic obstructive pulmonary disease) ([place])   . Coronary artery disease   . DDD (degenerative disc disease), lumbar   . Depression   . Dysphagia    patient reports d/t surgery, has modified diet, has to tilt head to swallow   . History of blood transfusion 2014   post op bleed  . Migraine   . Spider bite 2012  . Stroke [facility])    11/2013    PAST SURGICAL HISTORY: Past Surgical History:  Procedure Laterality Date  . ANTERIOR CERVICAL DECOMP/DISCECTOMY FUSION  12/22/2011   Procedure: ANTERIOR CERVICAL DECOMPRESSION/DISCECTOMY FUSION 3 LEVELS;  Surgeon: Melina Schools, MD;  Location: [place];  Service: Orthopedics;  Laterality: N/A;  ACDF C5-T1  . ANTERIOR CERVICAL DECOMP/DISCECTOMY FUSION  06/25/2012   Procedure: ANTERIOR CERVICAL DECOMPRESSION/DISCECTOMY FUSION 1 LEVEL/HARDWARE REMOVAL;  Surgeon: [name], MD;  Location: [place];  Service: Orthopedics;  Laterality: N/A;  Removal anterior cervical plate C5-T1, Explore Fusion, Left C5-6, C6-7, C7-T1 Re-do Foraminotomy, Left open carpal tunnel release with release of ulnar nerve at [facility], Posterior Cervical Fusion with lateral mass screw  . BACK SURGERY    . CARDIAC CATHETERIZATION    . CARPAL TUNNEL RELEASE  06/25/2012   Procedure: CARPAL TUNNEL RELEASE;  Surgeon: [name], MD;  Location: [place];  Service: Orthopedics;   Laterality: Left;  as above  . CHOLECYSTECTOMY  2009  . CORONARY ANGIOPLASTY  2001   [facility]  . LUMBAR FUSION  2000  . POSTERIOR CERVICAL FUSION/FORAMINOTOMY  06/25/2012   Procedure: POSTERIOR CERVICAL FUSION/FORAMINOTOMY LEVEL 1;  Surgeon: [name], MD;  Location: [place];  Service: Orthopedics;  Laterality: N/A;  as above  . POSTERIOR CERVICAL FUSION/FORAMINOTOMY N/A 12/17/2013   Procedure: REMOVAL OF POSTERIOR CERVICAL FUSION LATERAL MASS SCREWS AND RODS C5-T1, EXPLORE FUSION, LEFT SIX-SEVEN FORAMINOTOMY;  Surgeon: [name], MD;  Location: [place];  Service: Orthopedics;  Laterality: N/A;  . vasectomy  1990    FAMILY HISTORY: Family History  Problem Relation Age of Onset  . ALS Mother   . Cancer Mother   . Cancer Sister   . ALS Sister   . Cancer Brother        1 brother  . Anesthesia problems Neg Hx     SOCIAL HISTORY:  Social History   Socioeconomic History  . Marital status: Married    Spouse name: Hassan Rowan  . Number of  children: 2  . Years of education: 10th  . Highest education level: Not on file  Occupational History  . Occupation: Disability    Employer: [employer]  Social Needs  . Financial resource strain: Not on file  . Food insecurity:    Worry: Not on file    Inability: Not on file  . Transportation needs:    Medical: Not on file    Non-medical: Not on file  Tobacco Use  . Smoking status: Former Smoker    Packs/day: 1.00    Years: 30.00    Pack years: 30.00    Types: Cigarettes    Last attempt to quit: 2013    Years since quitting: 6.7  . Smokeless tobacco: Never Used  Substance and Sexual Activity  . Alcohol use: No    Alcohol/week: 0.0 standard drinks  . Drug use: No  . Sexual activity: Yes  Lifestyle  . Physical activity:    Days per week: Not on file    Minutes per session: Not on file  . Stress: Not on file  Relationships  . Social connections:    Talks on phone: Not on file    Gets together: Not on file    Attends religious  service: Not on file    Active member of club or organization: Not on file    Attends meetings of clubs or organizations: Not on file    Relationship status: Not on file  . Intimate partner violence:    Fear of current or ex partner: Not on file    Emotionally abused: Not on file    Physically abused: Not on file    Forced sexual activity: Not on file  Other Topics Concern  . Not on file  Social History Narrative   Patient lives at home with his spouse.   Caffeine Use: coffee     PHYSICAL EXAM  GENERAL EXAM/CONSTITUTIONAL: Vitals:  Vitals:   05/02/18 1128  BP: 121/74  Pulse: (!) 102  Weight: 150 lb (68 kg)  Height: 6' (1.829 m)     Body mass index is 20.34 kg/m. Wt Readings from Last 10 Encounters:  05/02/18 150 lb (68 kg)  04/30/18 150 lb 9.6 oz (68.3 kg)  03/15/18 156 lb (70.8 kg)  01/24/18 170 lb (77.1 kg)  12/11/17 152 lb (68.9 kg)  08/16/17 160 lb (72.6 kg)  06/13/17 158 lb 9.6 oz (71.9 kg)  04/18/17 159 lb 3.2 oz (72.2 kg)  04/13/17 160 lb (72.6 kg)  03/17/17 160 lb 3.2 oz (72.7 kg)    Patient is in no distress; well developed, nourished and groomed; neck is supple  CARDIOVASCULAR:  Examination of carotid arteries is normal; no carotid bruits  Regular rate and rhythm, no murmurs  Examination of peripheral vascular system by observation and palpation is normal  EYES:  Ophthalmoscopic exam of optic discs and posterior segments is normal; no papilledema or hemorrhages  Visual Acuity Screening   Right eye Left eye Both eyes  Without correction:     With correction: 20/30 20/20      MUSCULOSKELETAL:  Gait, strength, tone, movements noted in Neurologic exam below  NEUROLOGIC: MENTAL STATUS:  No flowsheet data found.  awake, alert, oriented to person, place and time  recent and remote memory intact  normal attention and concentration  language fluent, comprehension intact, naming intact  fund of knowledge appropriate  CRANIAL NERVE:     2nd - no papilledema on fundoscopic exam  2nd, 3rd, 4th, 6th - pupils  equal and reactive to light, visual fields full to confrontation, extraocular muscles intact, no nystagmus  5th - facial sensation symmetric  7th - facial strength symmetric  8th - hearing intact  9th - palate elevates symmetrically, uvula midline  11th - shoulder shrug symmetric  12th - tongue protrusion midline  MOTOR:   normal bulk and tone, full strength in the BUE, BLE  NO FASCICULATIONS  SENSORY:   normal and symmetric to light touch, temperature, vibration  COORDINATION:   finger-nose-finger, fine finger movements normal  REFLEXES:   deep tendon reflexes TRACE and symmetric  GAIT/STATION:   narrow based gait; ANTALGIC GAIT     DIAGNOSTIC DATA (LABS, IMAGING, TESTING) - I reviewed patient records, labs, notes, testing and imaging myself where available.  Lab Results  Component Value Date   WBC 10.4 01/24/2018   HGB 13.6 01/24/2018   HCT 40.0 01/24/2018   MCV 96.2 01/24/2018   PLT 260 01/24/2018      Component Value Date/Time   NA 144 01/24/2018 1940   NA 140 12/11/2017 1634   K 3.9 01/24/2018 1940   CL 107 01/24/2018 1940   CO2 25 01/24/2018 1929   GLUCOSE 110 (H) 01/24/2018 1940   BUN 12 01/24/2018 1940   BUN 5 (L) 12/11/2017 1634   CREATININE 0.60 (L) 01/24/2018 1940   CALCIUM 8.8 (L) 01/24/2018 1929   PROT 7.2 01/24/2018 1929   PROT 6.8 12/11/2017 1634   ALBUMIN 3.7 01/24/2018 1929   ALBUMIN 4.0 12/11/2017 1634   AST 17 01/24/2018 1929   ALT 13 01/24/2018 1929   ALKPHOS 66 01/24/2018 1929   BILITOT 0.4 01/24/2018 1929   BILITOT 0.5 12/11/2017 1634   GFRNONAA >60 01/24/2018 1929   GFRAA >60 01/24/2018 1929   No results found for: CHOL No results found for: HGBA1C Lab Results  Component Value Date   VITAMINB12 361 12/11/2017   Lab Results  Component Value Date   TSH 0.768 03/17/2017     04/21/14 CT LUMBAR MYELOGRAM 1. Unchanged appearance of the  lumbar spine. Prior posterior decompression and fusion at L5-S1 without recurrent stenosis.  2. Mild bilateral neural foraminal stenosis at L3-4 due to mild disc bulging, unchanged.  12/30/13 MRI BRAIN 1. No acute intracranial abnormality.  2. Multiple bilateral subcortical T2 hyperintensities are advanced for age. The finding is nonspecific but can be seen in the setting of chronic microvascular ischemia, a demyelinating process such as multiple sclerosis, vasculitis, complicated migraine headaches, or as the sequelae of a prior infectious or inflammatory process.  3. Scattered sinus disease as described.  12/12/13 CT CERVICAL  1. Fusion at C5-6, C6-7 and C7-T1.  2. Lateral mass screws at the same levels. The right C6 screw enters into the right C6-7 facet joint without nerve root compromise.  3. The lateral mass screws are otherwise contained within bone.  4. Mild central and right foraminal narrowing at C3-4 is stable.  5. Mild central and left foraminal stenosis at C4-5 is stable.  6. Mild right foraminal narrowing at C7-T1 due to right-sided uncovertebral spurring.  01/24/18 CT head / cervical  1. Negative noncontrast head CT. 2. No fracture or static subluxation of the cervical spine. 3. Post C5-C6, C6-C7 and C7-T1 intervertebral disc space replacement without evidence of hardware failure or loosening. 4. Mild to moderate DDD of C3-C4 and C4-C5. 5. Advanced emphysematous change within the imaged bilateral lung apices. Emphysema (ICD10-J43.9).   01/24/18 CT chest  1. No CT evidence for acute traumatic injury  within the chest, abdomen, and pelvis. 2. 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 3. Multiple scattered bilateral pulmonary nodules measuring up to 1 cm above, indeterminate. Non-contrast chest CT at 3-6 months is recommended. If the nodules are stable at time of repeat CT, then future CT at 18-24 months (from today's scan) is considered  optional for low-risk patients, but is recommended for high-risk patients. This recommendation follows the consensus statement: Guidelines for Management of Incidental Pulmonary Nodules Detected on CT Images: From the Fleischner Society 2017; Radiology 2017; 284:228-243.  4. Mildly enlarged precarinal and right hilar lymph nodes, indeterminate, but may be reactive. Attention at follow-up recommended.  5. Moderate atherosclerosis.  6. Mild sigmoid diverticulosis without evidence for acute diverticulitis.    ASSESSMENT AND PLAN  57 y.o. year old male here with daily headaches since last cervical spine surgery in May 2015. 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 Patient has tried topiramate, verapamil, lidocaine nasal spray without relief. Patient is planning to see pain management specialist for neck pain and low back pain. I suspect with improvement in neck and low back pain, patient sleeping improved as well as depression problems, and this in turn may improve headache control. Regarding MRI brain findings, I think these are small vessel ischemic disease changes related to prior smoking, and incidental to patient's headaches.  Patient states he may have ALS diagnosis, but does not know which clinic or doctor or when this was made. He states that his mother and brother have ALS, but do not know which neurologists they saw.   Dx: cervicogenic headaches with migraine features  1. Neck pain   2. Low back pain, unspecified back pain laterality, unspecified chronicity, unspecified whether sciatica present      PLAN:  - no clear evidence of ALS at this time (no fasciculations, no weakness, no hyperreflexia) - has significant pain and spasms due to degenerative spine issues (cervical and lumbar)  - consider referral to academic center ([facility]) for further evaluation (patient does not want to pursue additional testing; patient does not want EMG/NCS testing)  Return for  return to PCP.    Penni Bombard, MD 93/09/3555, 32:20 AM Certified in Neurology, Neurophysiology and Neuroimaging  [facility] [address], [place], [place] [address] [phone]

## 2018-05-03 DIAGNOSIS — R634 Abnormal weight loss: Secondary | ICD-10-CM | POA: Diagnosis not present

## 2018-05-08 DIAGNOSIS — R634 Abnormal weight loss: Secondary | ICD-10-CM | POA: Diagnosis not present

## 2018-05-09 ENCOUNTER — Telehealth: Payer: Self-pay

## 2018-05-09 NOTE — Telephone Encounter (Signed)
Said looking up pre cert and it says cancelled   He is having CT chest wo 05/15/18

## 2018-05-14 ENCOUNTER — Telehealth: Payer: Self-pay | Admitting: *Deleted

## 2018-05-14 NOTE — Telephone Encounter (Signed)
Left message, referral had been denied.

## 2018-05-15 ENCOUNTER — Ambulatory Visit (HOSPITAL_COMMUNITY)
Admission: RE | Admit: 2018-05-15 | Discharge: 2018-05-15 | Disposition: A | Discharge status: Home / Self Care | Payer: Medicaid Other | Source: Ambulatory Visit | Attending: Family | Admitting: Family

## 2018-05-15 DIAGNOSIS — I2510 Atherosclerotic heart disease of native coronary artery without angina pectoris: Secondary | ICD-10-CM | POA: Insufficient documentation

## 2018-05-15 DIAGNOSIS — R918 Other nonspecific abnormal finding of lung field: Secondary | ICD-10-CM | POA: Diagnosis not present

## 2018-05-15 DIAGNOSIS — R05 Cough: Secondary | ICD-10-CM | POA: Diagnosis not present

## 2018-05-15 DIAGNOSIS — Z87891 Personal history of nicotine dependence: Secondary | ICD-10-CM | POA: Insufficient documentation

## 2018-05-15 DIAGNOSIS — I700 Atherosclerosis of aorta: Secondary | ICD-10-CM | POA: Insufficient documentation

## 2018-05-15 DIAGNOSIS — R059 Cough, unspecified: Secondary | ICD-10-CM

## 2018-05-17 ENCOUNTER — Other Ambulatory Visit: Payer: Self-pay | Admitting: Family

## 2018-05-17 ENCOUNTER — Other Ambulatory Visit: Payer: Self-pay | Admitting: *Deleted

## 2018-05-17 DIAGNOSIS — R918 Other nonspecific abnormal finding of lung field: Secondary | ICD-10-CM

## 2018-05-17 MED ORDER — METHOCARBAMOL 750 MG PO TABS: 750.0000 mg | ORAL_TABLET | Freq: Two times a day (BID) | ORAL | 1 refills | Status: DC | PRN

## 2018-05-23 DIAGNOSIS — Z79899 Other long term (current) drug therapy: Secondary | ICD-10-CM | POA: Diagnosis not present

## 2018-06-09 DIAGNOSIS — R634 Abnormal weight loss: Secondary | ICD-10-CM | POA: Diagnosis not present

## 2018-06-15 ENCOUNTER — Encounter

## 2018-06-15 ENCOUNTER — Institutional Professional Consult (permissible substitution): Payer: Medicaid Other | Admitting: Diagnostic Neuroimaging

## 2018-06-22 DIAGNOSIS — Z79899 Other long term (current) drug therapy: Secondary | ICD-10-CM | POA: Diagnosis not present

## 2018-07-06 ENCOUNTER — Encounter: Payer: Self-pay | Admitting: Primary Care

## 2018-07-06 ENCOUNTER — Ambulatory Visit (INDEPENDENT_AMBULATORY_CARE_PROVIDER_SITE_OTHER): Payer: Medicaid Other | Admitting: Primary Care

## 2018-07-06 VITALS — BP 102/64 | HR 97 | Temp 98.1°F | Ht 72.0 in | Wt 153.8 lb

## 2018-07-06 DIAGNOSIS — R05 Cough: Secondary | ICD-10-CM

## 2018-07-06 DIAGNOSIS — R918 Other nonspecific abnormal finding of lung field: Secondary | ICD-10-CM | POA: Diagnosis not present

## 2018-07-06 DIAGNOSIS — J8410 Pulmonary fibrosis, unspecified: Secondary | ICD-10-CM | POA: Diagnosis not present

## 2018-07-06 DIAGNOSIS — R0681 Apnea, not elsewhere classified: Secondary | ICD-10-CM | POA: Diagnosis not present

## 2018-07-06 DIAGNOSIS — R059 Cough, unspecified: Secondary | ICD-10-CM

## 2018-07-06 LAB — SEDIMENTATION RATE: Sed Rate: 48 mm/hr — ABNORMAL HIGH (ref 0–20)

## 2018-07-06 LAB — C-REACTIVE PROTEIN: CRP: 2.5 mg/dL (ref 0.5–20.0)

## 2018-07-06 LAB — CK: Total CK: 22 U/L (ref 7–232)

## 2018-07-06 MED ORDER — BUDESONIDE-FORMOTEROL FUMARATE 160-4.5 MCG/ACT IN AERO: 2.0000 | INHALATION_SPRAY | Freq: Two times a day (BID) | RESPIRATORY_TRACT | 6 refills | Status: DC

## 2018-07-06 MED ORDER — BENZONATATE 200 MG PO CAPS: 200.0000 mg | ORAL_CAPSULE | Freq: Three times a day (TID) | ORAL | 1 refills | Status: DC | PRN

## 2018-07-06 MED ORDER — HYDROCODONE-HOMATROPINE 5-1.5 MG/5ML PO SYRP: 5.0000 mL | ORAL_SOLUTION | Freq: Every evening | ORAL | 0 refills | Status: DC | PRN

## 2018-07-06 NOTE — Patient Instructions (Addendum)
  Follow-up: Refer to lung cancer screening clinic in 6-12 months   Follow up CT chest wo contrast in 4 months   ILD clinic with Dr. Chase Caller or Dr. Vaughan Browner next available ( please mail ILD questionnaire to patient ) - 30 min apt (new patient)  RX: Refilling tessalon perles Hycodan cough syrup at bedtime only, may repeat once if needed (no refill)  Testing: HST YN:[id number]   ILD labs (ANA, RF, sed rate, sjogrens, ANCA, scleroderma)  Needs pulmonary function test  Spirometry today

## 2018-07-06 NOTE — Progress Notes (Signed)
@Patient  ID: Bradley Rice, male    DOB: [birth year], 57 y.o.   MRN: [id number]  Chief Complaint  Patient presents with  . Follow-up    SOB with exertion, cough with clear mucus, cough alot until pass out, pain left side with cough    Referring provider: Sharion Balloon, FNP  HPI: 57 year old male, former smoker. PMH significant for HTN, ALS, OSA, pulmonary nodules, post inflammatory pulmonary fibrosis. Patient of Dr. Halford Chessman, last seen in 2017. Epworth score 9/24. CT scan from May 2017 didn't show evidence for lung cancer, but did show emphysema and changes of pulmonary fibrosis.    07/06/2018  Patient presents today to follow-up on pulmonary nodules. He was seen back in 2017 by Dr. Halford Chessman and ordered for ONO, full pulmonary function testing, labs and serology. Former smoker, smoked 1ppd from the age [age]. Reports quitting in 2012.   Complains of coughing fits with clear mucus x 40months. He experiences shortness of breath when coughing. States that he coughs so much at night that he throws up and reports that he stops breathing. Rescue inhaler helps some. Unsure if he is using Symbicort twice daily as prescribed. Stopped breo.   CT Chest 05/15/18- Re-demonstration bilateral lung nodules, no new or enlarging nodules since June 2019. 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 Recommend follow up 6-12 months. Follow CT out for 2 years. Refer to ILD clinic. Refer to lung cancer screening.   Exposure history: Work- [employer], he was around fumes and gas without a mask (on disability for neck/back pain) NO birds, cats/dogs Has carpet No hot tub No beach house  Spirometry 07/06/2018 FVC 3.1 (59%), FEV1 2.7 (67%), ratio  87 (unable to complete)  Pulmonary tests CT angio chest 12/23/15 >> mild paraseptal emphysema, progressive fibrosis, several b/l nodules up to 8 mm   No Known Allergies  Immunization History  Administered Date(s) Administered    . Influenza Split 06/26/2012  . Influenza,inj,Quad PF,6+ Mos 08/24/2015, 08/25/2016, 04/30/2018  . Influenza-Unspecified 06/26/2012, 08/24/2015, 08/25/2016  . Tdap 06/01/2013    Past Medical History:  Diagnosis Date  . ALS (amyotrophic lateral sclerosis) ([place])   . Back pain   . Cancer ([place])    lung cancer  . COPD (chronic obstructive pulmonary disease) ([place])   . Coronary artery disease   . DDD (degenerative disc disease), lumbar   . Depression   . Dysphagia    patient reports d/t surgery, has modified diet, has to tilt head to swallow   . History of blood transfusion 2014   post op bleed  . Migraine   . Spider bite 2012  . Stroke [facility])    11/2013    Tobacco History: Social History   Tobacco Use  Smoking Status Former Smoker  . Packs/day: 1.00  . Years: 30.00  . Pack years: 30.00  . Types: Cigarettes  . Last attempt to quit: 2013  . Years since quitting: 6.9  Smokeless Tobacco Never Used   Counseling given: Not Answered   Outpatient Medications Prior to Visit  Medication Sig Dispense Refill  . albuterol (PROVENTIL HFA;VENTOLIN HFA) 108 (90 Base) MCG/ACT inhaler Inhale 2 puffs into the lungs every 6 (six) hours as needed for wheezing or shortness of breath. 1 Inhaler 5  . albuterol (PROVENTIL) (2.5 MG/3ML) 0.083% nebulizer solution Take 3 mLs (2.5 mg total) by nebulization every 6 (six) hours as needed for wheezing or shortness of breath. 150 mL 1  . baclofen (LIORESAL)  10 MG tablet Take 1 tablet (10 mg total) by mouth 3 (three) times daily. 30 each 0  . budesonide-formoterol (SYMBICORT) 80-4.5 MCG/ACT inhaler Inhale into the lungs.    . diphenoxylate-atropine (LOMOTIL) 2.5-0.025 MG tablet Take 1 tablet by mouth 4 (four) times daily as needed for diarrhea or loose stools. 30 tablet 0  . escitalopram (LEXAPRO) 20 MG tablet Take 1 tablet (20 mg total) by mouth daily. 90 tablet 1  . fluticasone furoate-vilanterol (BREO ELLIPTA) 100-25 MCG/INH AEPB Inhale into the lungs.     . methocarbamol (ROBAXIN) 750 MG tablet Take 1 tablet (750 mg total) by mouth 2 (two) times daily as needed. 180 tablet 1  . metoprolol succinate (TOPROL-XL) 50 MG 24 hr tablet Take 1 tablet (50 mg total) by mouth daily. 90 tablet 1  . Nutritional Supplements (ENSURE HIGH PROTEIN) LIQD Take 237 mLs by mouth 2 (two) times daily. 60 Can 5  . omeprazole (PRILOSEC) 20 MG capsule Take 1 capsule (20 mg total) by mouth daily. 90 capsule 1  . ondansetron (ZOFRAN ODT) 4 MG disintegrating tablet Take 1 tablet (4 mg total) by mouth every 8 (eight) hours as needed for nausea or vomiting. 30 tablet 2  . oxyCODONE (ROXICODONE) 15 MG immediate release tablet Take 15 mg by mouth 4 (four) times daily.  0  . sildenafil (REVATIO) 20 MG tablet Take 2-5 tabs PRN prior to sexual activity.    Marland Kitchen topiramate (TOPAMAX) 50 MG tablet Take 2 tabs BID    . benzonatate (TESSALON) 200 MG capsule Take 1 capsule (200 mg total) by mouth 3 (three) times daily as needed. 90 capsule 1  . cloNIDine (CATAPRES) 0.1 MG tablet Take 0.1 mg every 8 hours for withdrawal symptoms (Patient not taking: Reported on 07/06/2018) 90 tablet 3  . oxyCODONE-acetaminophen (PERCOCET) 10-325 MG tablet TAKE ONE TABLET BY MOUTH FIVE TIMES A DAY.  0   No facility-administered medications prior to visit.     Review of Systems  Review of Systems  Constitutional: Positive for appetite change and unexpected weight change.  HENT: Negative.   Respiratory: Positive for cough and shortness of breath. Negative for wheezing.   Cardiovascular: Negative.   Skin:       Finger/nail clubbing   Psychiatric/Behavioral: Negative.     Physical Exam  BP 102/64 (BP Location: Right Arm, Cuff Size: Normal)   Pulse 97   Temp 98.1 F (36.7 C)   Ht 6' (1.829 m)   Wt 153 lb 12.8 oz (69.8 kg)   SpO2 96%   BMI 20.86 kg/m  Physical Exam  Constitutional: He appears well-developed and well-nourished. No distress.  HENT:  Head: Normocephalic and atraumatic.  Eyes:  Pupils are equal, round, and reactive to light. EOM are normal.  Neck: Normal range of motion. Neck supple.  Cardiovascular: Normal rate and regular rhythm.  Pulmonary/Chest: Effort normal. No respiratory distress. He has no wheezes.  Fine crackles to bilateral bases R>L  Musculoskeletal: Normal range of motion.  Skin: Skin is warm and dry.  Nail clubbing   Psychiatric: He has a normal mood and affect. His behavior is normal. Judgment and thought content normal.     Lab Results:  CBC    Component Value Date/Time   WBC 10.4 01/24/2018 1929   RBC 4.23 01/24/2018 1929   HGB 13.6 01/24/2018 1940   HGB 13.6 12/11/2017 1634   HCT 40.0 01/24/2018 1940   HCT 40.6 12/11/2017 1634   PLT 260 01/24/2018 1929   PLT 358  12/11/2017 1634   MCV 96.2 01/24/2018 1929   MCV 95 12/11/2017 1634   MCH 31.7 01/24/2018 1929   MCHC 32.9 01/24/2018 1929   RDW 13.3 01/24/2018 1929   RDW 13.8 12/11/2017 1634   LYMPHSABS 2.9 01/24/2018 1929   LYMPHSABS 3.2 (H) 12/11/2017 1634   MONOABS 1.0 01/24/2018 1929   EOSABS 0.4 01/24/2018 1929   EOSABS 0.2 12/11/2017 1634   BASOSABS 0.2 (H) 01/24/2018 1929   BASOSABS 0.1 12/11/2017 1634    BMET    Component Value Date/Time   NA 144 01/24/2018 1940   NA 140 12/11/2017 1634   K 3.9 01/24/2018 1940   CL 107 01/24/2018 1940   CO2 25 01/24/2018 1929   GLUCOSE 110 (H) 01/24/2018 1940   BUN 12 01/24/2018 1940   BUN 5 (L) 12/11/2017 1634   CREATININE 0.60 (L) 01/24/2018 1940   CALCIUM 8.8 (L) 01/24/2018 1929   GFRNONAA >60 01/24/2018 1929   GFRAA >60 01/24/2018 1929    BNP No results found for: BNP  ProBNP No results found for: PROBNP  Imaging: No results found.   Assessment & Plan:   No problem-specific Assessment & Plan notes found for this encounter.     Martyn Ehrich, NP 07/06/2018

## 2018-07-09 LAB — SJOGRENS SYNDROME-B EXTRACTABLE NUCLEAR ANTIBODY: SSB (La) (ENA) Antibody, IgG: <1 AI

## 2018-07-09 LAB — RHEUMATOID FACTOR: Rheumatoid fact SerPl-aCnc: <14 IU/mL (ref <14)

## 2018-07-09 LAB — SJOGRENS SYNDROME-A EXTRACTABLE NUCLEAR ANTIBODY: SSA (Ro) (ENA) Antibody, IgG: <1 AI

## 2018-07-09 LAB — ANTI-SCLERODERMA ANTIBODY: SCLERODERMA (SCL-70) (ENA) ANTIBODY, IGG: NEGATIVE AI

## 2018-07-09 LAB — ANCA SCREEN W REFLEX TITER: ANCA Screen: NEGATIVE

## 2018-07-09 LAB — CYCLIC CITRUL PEPTIDE ANTIBODY, IGG: Cyclic Citrullin Peptide Ab: <16 UNITS

## 2018-07-09 LAB — ALDOLASE: ALDOLASE: 3.9 U/L (ref <=8.1)

## 2018-07-09 LAB — ANA: Anti Nuclear Antibody(ANA): NEGATIVE

## 2018-07-09 LAB — ANTI-DNA ANTIBODY, DOUBLE-STRANDED: ds DNA Ab: <1 IU/mL

## 2018-07-10 DIAGNOSIS — R634 Abnormal weight loss: Secondary | ICD-10-CM | POA: Diagnosis not present

## 2018-07-11 ENCOUNTER — Other Ambulatory Visit: Payer: Self-pay

## 2018-07-13 LAB — HYPERSENSITIVITY PNEUMONITIS
A. FUMIGATUS #1 ABS: NEGATIVE
A. Pullulans Abs: NEGATIVE
MICROPOLYSPORA FAENI IGG: NEGATIVE
Pigeon Serum Abs: NEGATIVE
Thermoact. Saccharii: NEGATIVE
Thermoactinomyces vulgaris, IgG: NEGATIVE

## 2018-07-15 ENCOUNTER — Telehealth: Payer: Self-pay | Admitting: Primary Care

## 2018-07-15 ENCOUNTER — Encounter: Payer: Self-pay | Admitting: Primary Care

## 2018-07-15 DIAGNOSIS — J8410 Pulmonary fibrosis, unspecified: Secondary | ICD-10-CM | POA: Insufficient documentation

## 2018-07-15 DIAGNOSIS — R918 Other nonspecific abnormal finding of lung field: Secondary | ICD-10-CM | POA: Insufficient documentation

## 2018-07-15 DIAGNOSIS — R0681 Apnea, not elsewhere classified: Secondary | ICD-10-CM | POA: Insufficient documentation

## 2018-07-15 DIAGNOSIS — J84112 Idiopathic pulmonary fibrosis: Secondary | ICD-10-CM | POA: Insufficient documentation

## 2018-07-15 NOTE — Assessment & Plan Note (Signed)
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

## 2018-07-15 NOTE — Assessment & Plan Note (Signed)
Needs split night sleep study

## 2018-07-15 NOTE — Telephone Encounter (Signed)
We need to mail the ILD questionnaire to patient, has apt with Dr. Evette Georges 23rd (complete and bring to apt with him). Thanks

## 2018-07-15 NOTE — Assessment & Plan Note (Addendum)
-   CT Chest 05/15/18- Re-demonstration bilateral lung nodules, no new or enlarging nodules since June 2019. Largest nodule measures 8 mm in the right middle lobe. Recommend follow up 6-12 months. Follow CT out for 2 years

## 2018-07-16 NOTE — Telephone Encounter (Signed)
Sent out in the mail today

## 2018-07-17 DIAGNOSIS — Z79899 Other long term (current) drug therapy: Secondary | ICD-10-CM | POA: Diagnosis not present

## 2018-08-10 DIAGNOSIS — R634 Abnormal weight loss: Secondary | ICD-10-CM | POA: Diagnosis not present

## 2018-08-15 ENCOUNTER — Telehealth: Payer: Self-pay | Admitting: Internal Medicine

## 2018-08-15 ENCOUNTER — Other Ambulatory Visit: Payer: Self-pay | Admitting: Primary Care

## 2018-08-15 DIAGNOSIS — Z79899 Other long term (current) drug therapy: Secondary | ICD-10-CM | POA: Diagnosis not present

## 2018-08-15 NOTE — Telephone Encounter (Signed)
Called and spoke with patient, he stated that he is having a dry cough, congestion, and a fever of 101.3. patient wants to be seen but cannot be seen today. Patient has been scheduled to see [place] on 08/16/2018. Patient is aware of time, date, and location. Nothing further needed. Advised patient if symptoms get worse he will need to seek emergency care.

## 2018-08-16 ENCOUNTER — Ambulatory Visit (INDEPENDENT_AMBULATORY_CARE_PROVIDER_SITE_OTHER)
Admission: RE | Admit: 2018-08-16 | Discharge: 2018-08-16 | Disposition: A | Discharge status: Home / Self Care | Payer: Medicaid Other | Source: Ambulatory Visit | Attending: Nurse Practitioner | Admitting: Nurse Practitioner

## 2018-08-16 ENCOUNTER — Ambulatory Visit: Payer: Medicaid Other | Admitting: Nurse Practitioner

## 2018-08-16 ENCOUNTER — Encounter: Payer: Self-pay | Admitting: Nurse Practitioner

## 2018-08-16 VITALS — BP 134/70 | HR 99 | Temp 99.5°F | Ht 72.0 in | Wt 148.0 lb

## 2018-08-16 DIAGNOSIS — J40 Bronchitis, not specified as acute or chronic: Secondary | ICD-10-CM | POA: Insufficient documentation

## 2018-08-16 DIAGNOSIS — R509 Fever, unspecified: Secondary | ICD-10-CM | POA: Diagnosis not present

## 2018-08-16 DIAGNOSIS — R05 Cough: Secondary | ICD-10-CM

## 2018-08-16 DIAGNOSIS — R079 Chest pain, unspecified: Secondary | ICD-10-CM | POA: Diagnosis not present

## 2018-08-16 DIAGNOSIS — R0602 Shortness of breath: Secondary | ICD-10-CM

## 2018-08-16 DIAGNOSIS — J8410 Pulmonary fibrosis, unspecified: Secondary | ICD-10-CM | POA: Diagnosis not present

## 2018-08-16 DIAGNOSIS — R059 Cough, unspecified: Secondary | ICD-10-CM

## 2018-08-16 MED ORDER — PREDNISONE 10 MG PO TABS: ORAL_TABLET | ORAL | 0 refills | Status: DC

## 2018-08-16 MED ORDER — HYDROCODONE-HOMATROPINE 5-1.5 MG/5ML PO SYRP: 5.0000 mL | ORAL_SOLUTION | Freq: Every evening | ORAL | 0 refills | Status: AC | PRN

## 2018-08-16 MED ORDER — DOXYCYCLINE HYCLATE 100 MG PO TABS: 100.0000 mg | ORAL_TABLET | Freq: Two times a day (BID) | ORAL | 0 refills | Status: DC

## 2018-08-16 MED ORDER — METHYLPREDNISOLONE ACETATE 80 MG/ML IJ SUSP
80.0000 mg | Freq: Once | INTRAMUSCULAR | Status: AC
  Administered 2018-08-16: 80 mg via INTRAMUSCULAR

## 2018-08-16 MED ORDER — LEVALBUTEROL HCL 0.63 MG/3ML IN NEBU
0.6300 mg | INHALATION_SOLUTION | Freq: Once | RESPIRATORY_TRACT | Status: AC
  Administered 2018-08-16: 0.63 mg via RESPIRATORY_TRACT

## 2018-08-16 NOTE — Patient Instructions (Addendum)
Will order doxycycline Will order prednisone taper Will refill hycodan Continue Symbicort Continue Proventil May take mucinex xopenex neb treatment given in office today DepoMedrol given in office today Will order chest xray and call with results Follow up with Dr. Chase Caller as already scheduled

## 2018-08-16 NOTE — Assessment & Plan Note (Signed)
Will check chest X ray concerned for pneumonia.  Patient Instructions  Will order doxycycline Will order prednisone taper Will refill hycodan Continue Symbicort Continue Proventil May take mucinex xopenex neb treatment given in office today DepoMedrol given in office today Will order chest xray and call with results Follow up with Dr. Chase Caller as already scheduled

## 2018-08-16 NOTE — Assessment & Plan Note (Signed)
Please keep upcoming appointment with Dr. Chase Caller for ILD clinic.

## 2018-08-16 NOTE — Progress Notes (Signed)
Reviewed and agree with assessment/plan.   Rutha Melgoza, MD [facility] 07/27/2016, 12:24 PM Pager:  [phone]  

## 2018-08-16 NOTE — Progress Notes (Signed)
@Patient  ID: Bradley Rice, male    DOB: 1961/06/06, 58 y.o.   MRN: [id number]  Chief Complaint  Patient presents with  . Fever    with non productive cough. Went ot [facility] yesterday, no Xray provided, but told he had pneumonia.    Referring provider: Sharion Balloon, FNP  HPI 58 year old male former smoker with pulmonary nodules, postinflammatory pulmonary fibrosis, and OSA followed by Dr. Halford Chessman.  PMH significant for hypertension and ALS.   Tests: CT Chest 05/15/18>>Re-demonstration of bilateral lung nodules, with no new or enlarging lung nodules.Redemonstration of chronic interstitial lung disease, likely representing IPF/UIP.  CT Chest 01/24/18>>No CT evidence for acute traumatic injury within the chest, abdomen, and pelvis. 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 Multiple scattered bilateral pulmonary nodules measuring up to 1cm above, indeterminate. Mildly enlarged precarinal and right hilar lymph nodes, indeterminate, but may be reactive. Attention at follow-up recommended.  OV 08/16/18 - acute cough/fever Patient presents today with cough and fever.  He states that symptoms started on 08/11/2018.  He was seen by urgent care yesterday.  He was not prescribed any medications.  His cough is nonproductive.  He states that symptoms are progressively worsening.  His temperature was 102 F last night.  He has a low-grade fever in the office today.  Denies any sinus congestion.  He denies any chest pain or edema.  He is compliant with Symbicort and Proventil.   No Known Allergies  Immunization History  Administered Date(s) Administered  . Influenza Split 06/26/2012  . Influenza,inj,Quad PF,6+ Mos 08/24/2015, 08/25/2016, 04/30/2018  . Influenza-Unspecified 06/26/2012, 08/24/2015, 08/25/2016  . Tdap 06/01/2013    Past Medical History:  Diagnosis Date  . ALS (amyotrophic lateral sclerosis) ([place])   . Back pain   . Cancer ([place])     lung cancer  . COPD (chronic obstructive pulmonary disease) ([place])   . Coronary artery disease   . DDD (degenerative disc disease), lumbar   . Depression   . Dysphagia    patient reports d/t surgery, has modified diet, has to tilt head to swallow   . History of blood transfusion 2014   post op bleed  . Migraine   . Spider bite 2012  . Stroke [facility])    11/2013    Tobacco History: Social History   Tobacco Use  Smoking Status Former Smoker  . Packs/day: 1.00  . Years: 30.00  . Pack years: 30.00  . Types: Cigarettes  . Last attempt to quit: 2013  . Years since quitting: 7.0  Smokeless Tobacco Never Used   Counseling given: Yes   Outpatient Encounter Medications as of 08/16/2018  Medication Sig  . albuterol (PROVENTIL HFA;VENTOLIN HFA) 108 (90 Base) MCG/ACT inhaler Inhale 2 puffs into the lungs every 6 (six) hours as needed for wheezing or shortness of breath.  Marland Kitchen albuterol (PROVENTIL) (2.5 MG/3ML) 0.083% nebulizer solution Take 3 mLs (2.5 mg total) by nebulization every 6 (six) hours as needed for wheezing or shortness of breath.  . baclofen (LIORESAL) 10 MG tablet Take 1 tablet (10 mg total) by mouth 3 (three) times daily.  . benzonatate (TESSALON) 200 MG capsule Take 1 capsule (200 mg total) by mouth 3 (three) times daily as needed.  . budesonide-formoterol (SYMBICORT) 160-4.5 MCG/ACT inhaler Inhale 2 puffs into the lungs 2 (two) times daily.  . cloNIDine (CATAPRES) 0.1 MG tablet Take 0.1 mg every 8 hours for withdrawal symptoms  . diphenoxylate-atropine (LOMOTIL) 2.5-0.025 MG tablet Take  1 tablet by mouth 4 (four) times daily as needed for diarrhea or loose stools.  Marland Kitchen escitalopram (LEXAPRO) 20 MG tablet Take 1 tablet (20 mg total) by mouth daily.  Marland Kitchen HYDROcodone-homatropine (HYCODAN) 5-1.5 MG/5ML syrup Take 5 mLs by mouth at bedtime and may repeat dose one time if needed for 5 days.  . methocarbamol (ROBAXIN) 750 MG tablet Take 1 tablet (750 mg total) by mouth 2 (two) times  daily as needed.  . metoprolol succinate (TOPROL-XL) 50 MG 24 hr tablet Take 1 tablet (50 mg total) by mouth daily.  . Nutritional Supplements (ENSURE HIGH PROTEIN) LIQD Take 237 mLs by mouth 2 (two) times daily.  Marland Kitchen omeprazole (PRILOSEC) 20 MG capsule Take 1 capsule (20 mg total) by mouth daily.  . ondansetron (ZOFRAN ODT) 4 MG disintegrating tablet Take 1 tablet (4 mg total) by mouth every 8 (eight) hours as needed for nausea or vomiting.  Marland Kitchen oxyCODONE (ROXICODONE) 15 MG immediate release tablet Take 15 mg by mouth 4 (four) times daily.  . sildenafil (REVATIO) 20 MG tablet Take 2-5 tabs PRN prior to sexual activity.  Marland Kitchen topiramate (TOPAMAX) 50 MG tablet Take 2 tabs BID  . [DISCONTINUED] fluticasone furoate-vilanterol (BREO ELLIPTA) 100-25 MCG/INH AEPB Inhale into the lungs.  . [DISCONTINUED] HYDROcodone-homatropine (HYCODAN) 5-1.5 MG/5ML syrup Take 5 mLs by mouth at bedtime and may repeat dose one time if needed.  . doxycycline (VIBRA-TABS) 100 MG tablet Take 1 tablet (100 mg total) by mouth 2 (two) times daily.  . predniSONE (DELTASONE) 10 MG tablet Take 4 tabs for 2 days, then 3 tabs for 2 days, then 2 tabs for 2 days, then 1 tab for 2 days, then stop  . [DISCONTINUED] budesonide-formoterol (SYMBICORT) 80-4.5 MCG/ACT inhaler Inhale into the lungs.  . [DISCONTINUED] oxyCODONE-acetaminophen (PERCOCET) 10-325 MG tablet TAKE ONE TABLET BY MOUTH FIVE TIMES A DAY.  . [EXPIRED] levalbuterol (XOPENEX) nebulizer solution 0.63 mg   . [EXPIRED] methylPREDNISolone acetate (DEPO-MEDROL) injection 80 mg    No facility-administered encounter medications on file as of 08/16/2018.      Review of Systems  Review of Systems  Constitutional: Positive for chills and fever.  HENT: Negative.  Negative for sinus pressure and sinus pain.   Respiratory: Positive for cough and shortness of breath. Negative for wheezing.   Cardiovascular: Negative.  Negative for chest pain, palpitations and leg swelling.    Gastrointestinal: Negative.   Allergic/Immunologic: Negative.   Neurological: Negative.   Psychiatric/Behavioral: Negative.        Physical Exam  BP 134/70 (BP Location: Left Arm, Patient Position: Sitting, Cuff Size: Normal)   Pulse 99   Temp 99.5 F (37.5 C)   Ht 6' (1.829 m)   Wt 148 lb (67.1 kg)   SpO2 95%   BMI 20.07 kg/m   Wt Readings from Last 5 Encounters:  08/16/18 148 lb (67.1 kg)  07/06/18 153 lb 12.8 oz (69.8 kg)  05/02/18 150 lb (68 kg)  04/30/18 150 lb 9.6 oz (68.3 kg)  03/15/18 156 lb (70.8 kg)     Physical Exam Vitals signs and nursing note reviewed.  Constitutional:      General: He is not in acute distress.    Appearance: He is well-developed.  Cardiovascular:     Rate and Rhythm: Normal rate and regular rhythm.  Pulmonary:     Effort: Pulmonary effort is normal. No respiratory distress.     Breath sounds: Normal breath sounds. No wheezing or rhonchi.  Musculoskeletal:  General: No swelling.  Skin:    General: Skin is warm and dry.  Neurological:     Mental Status: He is alert and oriented to person, place, and time.        Assessment & Plan:   Pulmonary fibrosis ([place]) Please keep upcoming appointment with Dr. Chase Caller for ILD clinic.  Bronchitis Will check chest X ray concerned for pneumonia.  Patient Instructions  Will order doxycycline Will order prednisone taper Will refill hycodan Continue Symbicort Continue Proventil May take mucinex xopenex neb treatment given in office today DepoMedrol given in office today Will order chest xray and call with results Follow up with Dr. Chase Caller as already scheduled          Fenton Foy, NP 08/16/2018

## 2018-08-22 ENCOUNTER — Other Ambulatory Visit: Payer: Self-pay | Admitting: Internal Medicine

## 2018-08-22 DIAGNOSIS — R0602 Shortness of breath: Secondary | ICD-10-CM

## 2018-08-23 ENCOUNTER — Encounter: Payer: Self-pay | Admitting: Internal Medicine

## 2018-08-23 ENCOUNTER — Telehealth: Payer: Self-pay | Admitting: Internal Medicine

## 2018-08-23 ENCOUNTER — Ambulatory Visit (INDEPENDENT_AMBULATORY_CARE_PROVIDER_SITE_OTHER): Payer: Medicaid Other | Admitting: Internal Medicine

## 2018-08-23 VITALS — BP 124/70 | HR 93 | Ht 72.0 in | Wt 150.2 lb

## 2018-08-23 DIAGNOSIS — J8410 Pulmonary fibrosis, unspecified: Secondary | ICD-10-CM

## 2018-08-23 DIAGNOSIS — R0609 Other forms of dyspnea: Secondary | ICD-10-CM | POA: Diagnosis not present

## 2018-08-23 DIAGNOSIS — J84112 Idiopathic pulmonary fibrosis: Secondary | ICD-10-CM

## 2018-08-23 DIAGNOSIS — R05 Cough: Secondary | ICD-10-CM | POA: Diagnosis not present

## 2018-08-23 DIAGNOSIS — J439 Emphysema, unspecified: Secondary | ICD-10-CM | POA: Diagnosis not present

## 2018-08-23 DIAGNOSIS — R059 Cough, unspecified: Secondary | ICD-10-CM

## 2018-08-23 MED ORDER — TIOTROPIUM BROMIDE MONOHYDRATE 2.5 MCG/ACT IN AERS: 2.0000 | INHALATION_SPRAY | Freq: Every day | RESPIRATORY_TRACT | 0 refills | Status: DC

## 2018-08-23 NOTE — Patient Instructions (Addendum)
ICD-10-CM   1. IPF (idiopathic pulmonary fibrosis) ([place]) J84.112   2. Cough R05   3. Dyspnea on exertion R06.09   4. Pulmonary emphysema, unspecified emphysema type ([place]) J43.9   5. Pulmonary emphysema with fibrosis of lung ([place]) J43.9    J84.10     You have IPF - diagnosis given 08/23/2018 This disease as discussed is progressive in nature   - just like ALS has unpredictable course  - can be faster than ALS  PLAN - cough: address in future  - dyspnea: refer pulmonary rehab at [facility] - Emphysema: continue symbicort. Start spiriva daily (might help with cough)  - IPF: start esbriet per protocol - test ONO room air at night   Followup  - 6 weeks do spirometry/dlco  - return to ILD clinic in 6 weeks  - do ILD questions again at followup

## 2018-08-23 NOTE — Progress Notes (Signed)
OV Nov 2017 History of Present Illness Bradley Rice is a 58 y.o. male for evaluation of sleep problems.  He has noticed trouble falling asleep and staying asleep.  He feels some of this is related to neck and back pain.  His wife has told him that he snores, and will stop breathing while asleep.  He has a hard time slowing things down when he is trying to sleep.  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  He reports not being seen by a neurologist for past 2 years.  He also reports history of non small cell lung cancer from about 2 years ago.  He was told the ALS therapy and cancer therapy couldn't be given together, so he stopped both therapies.  He reports seeing a lung doctor about 2 years ago, but was only told that he had lung cancer but no other lung disease that he is aware of.  He had all these appointments in [place], and he thinks these were at [facility].  He goes to sleep at 10 pm.  He falls asleep after 2 hours.  He wakes up 3 or 4 times, and then has trouble falling back to sleep.  He gets out of bed at 7 am.  He feels tired in the morning.  He denies morning headache.  He does not use anything to help him fall sleep or stay awake.  He denies sleep walking, sleep talking, bruxism, or nightmares.  There is no history of restless legs.  He denies sleep hallucinations, sleep paralysis, or cataplexy.  The Epworth score is 9 out of 24.   07/06/2018  Patient presents today to follow-up on pulmonary nodules. He was seen back in 2017 by Dr. Halford Chessman and ordered for ONO, full pulmonary function testing, labs and serology. Former smoker, smoked 1ppd from the age [age]. Reports quitting in 2012.   Complains of coughing fits with clear mucus x 43months. He experiences shortness of breath when coughing. States that he coughs so much at night that he throws up and reports that he stops breathing. Rescue inhaler helps some.  Unsure if he is using Symbicort twice daily as prescribed. Stopped breo.   CT Chest 05/15/18- Re-demonstration bilateral lung nodules, no new or enlarging nodules since June 2019. 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 Recommend follow up 6-12 months. Follow CT out for 2 years. Refer to ILD clinic. Refer to lung cancer screening.   Exposure history: Work- [employer], he was around fumes and gas without a mask (on disability for neck/back pain) NO birds, cats/dogs Has carpet No hot tub No beach house  Spirometry 07/06/2018 FVC 3.1 (59%), FEV1 2.7 (67%), ratio  87 (unable to complete)  Pulmonary tests CT angio chest 12/23/15 >> mild paraseptal emphysema, progressive fibrosis, several b/l nodules up to 8 mm     OV 08/23/2018  Subjective:  Patient ID: Bradley Rice, male , DOB: [birth year] , age 18 y.o. , MRN: [id number] , ADDRESS: [place] [id number]   08/23/2018 -   Chief Complaint  Patient presents with  . Follow-up    PFT not performed.  Pt states he is getting better and states he is no longer running a fever. Pt states that he does have complaints of SOB, cough with occ white phlegm, and CP.     HPI [name] 58 y.o. -  presents to the ILD clinic as a second opinion referral from Dr. Ricarda Frame nurse practitioner.  He has a diagnosis of ALS and under follow-up at [facility].  He is still fairly functional and that he can do his activities of daily living.  He does not have any dysphagia.  He has now developed interstitial lung disease on top of his underlying emphysema which she is known to have.  Therefore has been referred here.  He tells me that he has had insidious worsening of shortness of breath for the last year or so.  He has difficulty doing activities of daily living because of dyspnea.  He does not believe his ALS is causing his dyspnea.  He also has significant severe chronic cough  but sometimes causes near presyncope.  He is only on Symbicort for his previous diagnosis of emphysema.  He was last seen in our practice in 2017 but after that reestablished in the last few months during which time the ILD was discovered and he is now been referred to the ILD clinic.  He reports having answered all the questions on interstitial lung disease questionnaire but this questionnaire is now missing with his answers.  Therefore he had to redo. Does not want to do it 08/23/18       Simple office walk 185 feet x  3 laps goal with forehead probe 08/23/2018   O2 used Room air  Number laps completed 3  Comments about pace Slow pace*  Resting Pulse Ox/HR 99% and 93/min  Final Pulse Ox/HR 98% and 110/min  Desaturated </= 88% no  Desaturated <= 3% points no  Got Tachycardic >/= 90/min yes  Symptoms at end of test Moderate dyspnea, lightheaded  Miscellaneous comments       CT scan of the chest January 24, 2018 in my personal visualization shows classic UIP findings.  This includes bilateral bibasal subpleural disease with honeycombing and traction bronchiectasis.  Further up in the lungs is emphysema with subpleural bulla as opposed to honeycombing.  These findings predate 2017.  Looking back he might have early ILD changes even in 2014 CT chest.    Results for Bradley Rice, Bradley Rice (MRN [id number]) as of 08/23/2018 09:45  Ref. Range 06/10/2016 14:48 07/06/2018 10:53  Anti Nuclear Antibody(ANA) Latest Ref Range: NEGATIVE  Negative NEGATIVE  Cyclic Citrullin Peptide Ab Latest Units: UNITS <16 <16  ds DNA Ab Latest Units: IU/mL  <1  Jo-1 Antibody, IgG Latest Ref Range: <1.0 NEG AI  <1.0 NEG   RA Latex Turbid. Latest Ref Range: <14 IU/mL <14 <14  SSA (Ro) (ENA) Antibody, IgG Latest Ref Range: <1.0 NEG AI <1.0 NEG <1.0 NEG  SSB (La) (ENA) Antibody, IgG Latest Ref Range: <1.0 NEG AI <1.0 NEG <1.0 NEG  Scleroderma (Scl-70) (ENA) Antibody, IgG Latest Ref Range: <1.0 NEG AI <1.0 NEG <1.0 NEG     ROS - per HPI     has a past medical history of ALS (amyotrophic lateral sclerosis) ([place]), Back pain, Cancer ([place]), COPD (chronic obstructive pulmonary disease) ([place]), Coronary artery disease, DDD (degenerative disc disease), lumbar, Depression, Dysphagia, History of blood transfusion (2014), Migraine, Spider bite (2012), and Stroke [facility]).   reports that he quit smoking about 7 years ago. His smoking use included cigarettes. He has a 30.00 pack-year smoking history. He has never used smokeless tobacco.  Past Surgical History:  Procedure Laterality Date  . ANTERIOR CERVICAL DECOMP/DISCECTOMY FUSION  12/22/2011   Procedure: ANTERIOR CERVICAL DECOMPRESSION/DISCECTOMY FUSION  3 LEVELS;  Surgeon: Melina Schools, MD;  Location: [place];  Service: Orthopedics;  Laterality: N/A;  ACDF C5-T1  . ANTERIOR CERVICAL DECOMP/DISCECTOMY FUSION  06/25/2012   Procedure: ANTERIOR CERVICAL DECOMPRESSION/DISCECTOMY FUSION 1 LEVEL/HARDWARE REMOVAL;  Surgeon: [name], MD;  Location: [place];  Service: Orthopedics;  Laterality: N/A;  Removal anterior cervical plate C5-T1, Explore Fusion, Left C5-6, C6-7, C7-T1 Re-do Foraminotomy, Left open carpal tunnel release with release of ulnar nerve at [facility], Posterior Cervical Fusion with lateral mass screw  . BACK SURGERY    . CARDIAC CATHETERIZATION    . CARPAL TUNNEL RELEASE  06/25/2012   Procedure: CARPAL TUNNEL RELEASE;  Surgeon: [name], MD;  Location: [place];  Service: Orthopedics;  Laterality: Left;  as above  . CHOLECYSTECTOMY  2009  . CORONARY ANGIOPLASTY  2001   [facility]  . LUMBAR FUSION  2000  . POSTERIOR CERVICAL FUSION/FORAMINOTOMY  06/25/2012   Procedure: POSTERIOR CERVICAL FUSION/FORAMINOTOMY LEVEL 1;  Surgeon: [name], MD;  Location: [place];  Service: Orthopedics;  Laterality: N/A;  as above  . POSTERIOR CERVICAL FUSION/FORAMINOTOMY N/A 12/17/2013   Procedure: REMOVAL OF POSTERIOR CERVICAL FUSION LATERAL MASS SCREWS AND RODS C5-T1,  EXPLORE FUSION, LEFT SIX-SEVEN FORAMINOTOMY;  Surgeon: [name], MD;  Location: [place];  Service: Orthopedics;  Laterality: N/A;  . vasectomy  1990    No Known Allergies  Immunization History  Administered Date(s) Administered  . Influenza Split 06/26/2012  . Influenza,inj,Quad PF,6+ Mos 08/24/2015, 08/25/2016, 04/30/2018  . Influenza-Unspecified 06/26/2012, 08/24/2015, 08/25/2016  . Tdap 06/01/2013    Family History  Problem Relation Age of Onset  . ALS Mother   . Cancer Mother   . Cancer Sister   . ALS Sister   . Cancer Brother        1 brother  . Anesthesia problems Neg Hx      Current Outpatient Medications:  .  albuterol (PROVENTIL HFA;VENTOLIN HFA) 108 (90 Base) MCG/ACT inhaler, Inhale 2 puffs into the lungs every 6 (six) hours as needed for wheezing or shortness of breath., Disp: 1 Inhaler, Rfl: 5 .  albuterol (PROVENTIL) (2.5 MG/3ML) 0.083% nebulizer solution, Take 3 mLs (2.5 mg total) by nebulization every 6 (six) hours as needed for wheezing or shortness of breath., Disp: 150 mL, Rfl: 1 .  baclofen (LIORESAL) 10 MG tablet, Take 1 tablet (10 mg total) by mouth 3 (three) times daily., Disp: 30 each, Rfl: 0 .  budesonide-formoterol (SYMBICORT) 160-4.5 MCG/ACT inhaler, Inhale 2 puffs into the lungs 2 (two) times daily., Disp: 1 Inhaler, Rfl: 6 .  methocarbamol (ROBAXIN) 750 MG tablet, Take 1 tablet (750 mg total) by mouth 2 (two) times daily as needed., Disp: 180 tablet, Rfl: 1 .  metoprolol succinate (TOPROL-XL) 50 MG 24 hr tablet, Take 1 tablet (50 mg total) by mouth daily., Disp: 90 tablet, Rfl: 1 .  Nutritional Supplements (ENSURE HIGH PROTEIN) LIQD, Take 237 mLs by mouth 2 (two) times daily., Disp: 60 Can, Rfl: 5 .  oxyCODONE (ROXICODONE) 15 MG immediate release tablet, Take 15 mg by mouth 4 (four) times daily., Disp: , Rfl: 0 .  topiramate (TOPAMAX) 50 MG tablet, Take 2 tabs BID, Disp: , Rfl:  .  sildenafil (REVATIO) 20 MG tablet, Take 2-5 tabs PRN prior to sexual  activity., Disp: , Rfl:  .  Tiotropium Bromide Monohydrate (SPIRIVA RESPIMAT) 2.5 MCG/ACT AERS, Inhale 2 puffs into the lungs daily., Disp: 1 Inhaler, Rfl: 0  Objective:   Vitals:   08/23/18 0942  BP: 124/70  Pulse: 93  SpO2: 99%  Weight: 150 lb 3.2 oz (68.1 kg)  Height: 6' (1.829 m)    Estimated body mass index is 20.37 kg/m as calculated from the following:   Height as of this encounter: 6' (1.829 m).   Weight as of this encounter: 150 lb 3.2 oz (68.1 kg).  @WEIGHTCHANGE @  [employer]   08/23/18 0942  Weight: 150 lb 3.2 oz (68.1 kg)     Physical Exam  General Appearance:    Alert, cooperative, no distress, appears stated age - yes , Deconditioned looking - mild yhes , OBESE  - NO but LEAN, Sitting on Wheelchair -  no  Head:    Normocephalic, without obvious abnormality, atraumatic  Eyes:    PERRL, conjunctiva/corneas clear,  Ears:    Normal TM's and external ear canals, both ears  Nose:   Nares normal, septum midline, mucosa normal, no drainage    or sinus tenderness. OXYGEN ON  - no . Patient is @ ra   Throat:   Lips, mucosa, and tongue normal; teeth and gums normal. Cyanosis on lips - no  Neck:   Supple, symmetrical, trachea midline, no adenopathy;    thyroid:  no enlargement/tenderness/nodules; no carotid   bruit or JVD  Back:     Symmetric, no curvature, ROM normal, no CVA tenderness  Lungs:     Distress - no , Wheeze no, Barrell Chest - no, Purse lip breathing - no, Crackles - BILATERAL BIBASAL CRACKLES ++   Chest Wall:    No tenderness or deformity.   LEAN, TACHYPNEIC, SMALL INSP VOLUME   Heart:    Regular rate and rhythm, S1 and S2 normal, no rub   or gallop, Murmur - no  Breast Exam:    NOT DONE  Abdomen:     Soft, non-tender, bowel sounds active all four quadrants,    no masses, no organomegaly. Visceral obesity - no  Genitalia:   NOT DONE  Rectal:   NOT DONE  Extremities:   Extremities - normal, Has Cane - no, Clubbing - YES YES, Edema - no    Pulses:   2+ and symmetric all extremities  Skin:   Stigmata of Connective Tissue Disease - no  Lymph nodes:   Cervical, supraclavicular, and axillary nodes normal  Psychiatric:  Neurologic:   Pleasant - yes, Anxious - no, Flat affect - no  CAm-ICU - neg, Alert and Oriented x 3 - yes, Moves all 4s - yes, Speech - normal, Cognition - intact           Assessment:       ICD-10-CM   1. IPF (idiopathic pulmonary fibrosis) (HCC) Z61.096 Pulmonary function test    Pulse oximetry, overnight    AMB referral to pulmonary rehabilitation  2. Cough R05 Pulmonary function test  3. Dyspnea on exertion R06.09 AMB referral to pulmonary rehabilitation  4. Pulmonary emphysema, unspecified emphysema type ([place]) J43.9   5. Pulmonary emphysema with fibrosis of lung ([place]) J43.9    J84.10        Plan:     Patient Instructions     ICD-10-CM   1. IPF (idiopathic pulmonary fibrosis) ([place]) J84.112   2. Cough R05   3. Dyspnea on exertion R06.09   4. Pulmonary emphysema, unspecified emphysema type ([place]) J43.9   5. Pulmonary emphysema with fibrosis of lung ([place]) J43.9    J84.10     You have  IPF - diagnosis given 08/23/2018   =-I think he has IPF.  This is based on the fact that he is male and even though he is below 65 he has significant clubbing.  His exposures appear to be smoking and working in a [employer] and also around automobile fumes and metal which all fits in with IPF.  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  We spent a long time discussing course and treatment options  This disease as discussed is progressive in nature   - just like ALS has unpredictable course  - can be faster than ALS   6 pillars of ILD /IPF / Pulmonary Fibrosis Managment to be discussed over time  - medication including oxygen -discussed today  - rehab -will refer him to [facility]  - nutrition weight loss -he will need a high-protein diet because of his ALS.  This can be addressed in the  future  - support groups -will refer in the future  - research trials -briefly discussed importance of participating in research trials  - transplant -briefly touched upon transplant because he asked about it.  But this allowed to be addressed in the future.  His ALS might preclude him from transplant.   PLAN - cough: address in future  - dyspnea: refer pulmonary rehab at [facility] - Emphysema: continue symbicort. Start spiriva daily (might help with cough)  - IPF: start esbriet per protocol   Followup  - 6 weeks do spirometry/dlco  - return to ILD clinic in 6 weeks   > 50% of this > 40 min visit spent in face to face counseling or/and coordination of care - by this undersigned MD - Dr Brand Males. This includes one or more of the following documented above: discussion of test results, diagnostic or treatment recommendations, prognosis, risks and benefits of management options, instructions, education, compliance or risk-factor reduction   SIGNATURE    Dr. Brand Males, M.D., F.C.C.P,  Pulmonary and Critical Care Medicine Staff Physician, [facility] Director - Interstitial Lung Disease  Program  Pulmonary [place] at [place], [place], [address]  Pager: [phone], If no answer or between  15:00h - 7:00h: call [phone] Telephone: [phone]  12:11 PM 08/23/2018

## 2018-08-23 NOTE — Telephone Encounter (Signed)
Called and spoke with pt letting him know that MR was wanting him to do ONO. Pt expressed understanding. Order has been placed for ONO. Nothing further needed.

## 2018-08-23 NOTE — Telephone Encounter (Signed)
Please order ONO room air at night Forgot to instruct on that

## 2018-08-24 ENCOUNTER — Encounter: Payer: Self-pay | Admitting: Internal Medicine

## 2018-08-24 NOTE — Telephone Encounter (Signed)
error 

## 2018-08-29 ENCOUNTER — Telehealth: Payer: Self-pay | Admitting: Internal Medicine

## 2018-08-29 NOTE — Telephone Encounter (Signed)
At pt's last OV with MR 08/23/2018, MR wanted to start pt on Esbriet. Application was filled out and pt also filled out and signed patient assistance forms.  Received a fax that the application and patient assistance that was submitted was the old version of everything and we need to resubmit everything again with the current version.  I have submitted the application but pt still needs to do the patient assistance part.  Attempted to call pt to discuss the patient assistance paperwork with him to see if he could come by office to fill form out again or if he wanted [place] to mail it to him for him to mail back once completed but unable to reach pt. Left message for pt to return call.   Will discuss this with pt when pt returns call.

## 2018-09-03 NOTE — Telephone Encounter (Signed)
Bradley Rice please advise on the pharmacy name and if you've received anything indicating that a PA is needed.  Typically, the PA cannot be initiated until the rx is sent to the pharmacy, and per the below documentation, it looks like a new application still has to be filled out by the patient.

## 2018-09-03 NOTE — Telephone Encounter (Signed)
Bradley Rice from [place] needs speciality Pharmacy name,  Also would like to know if Esbriet needs a prior approval.  Bradley Rice [phone].  Faxing information today.  Fax number [phone].

## 2018-09-04 NOTE — Telephone Encounter (Signed)
[facility] is the pharmacy that pt's application was faxed to as well as [facility].

## 2018-09-07 NOTE — Telephone Encounter (Signed)
[place] at [phone] and spoke with Almyra Free to check status of pt's application. Per Almyra Free, due to pt's insurance pt had to use [facility] and pt's application was sent to [facility] 08/24/2018.  Stated to Almyra Free that I would call [facility] to get a status update due to receiving another fax from  [place] about whose pharmacy pt will be using and also to check status of PA.  We have not received a PA request yet from [place]. If [facility] states that they do not have the application that had been faxed to them from [place], I will fax the application to them.  [place] at [phone] and spoke with Merry Proud to check on status of pt's application. Per Merry Proud, they were still needing PA to be initiated. I stated to Merry Proud that we had not received anything in regards to the PA so he transferred me to the PA line. Transferred to PA line and spoke with Danae Chen and initiated PA over the phone for [place]. Reference #: [id number]. Per Danae Chen, we should receive a fax within at least 1 day with response of PA.  Will await a response.

## 2018-09-10 DIAGNOSIS — R634 Abnormal weight loss: Secondary | ICD-10-CM | POA: Diagnosis not present

## 2018-09-10 NOTE — Telephone Encounter (Signed)
Received PA decision which shows that Bradley Rice's Esbriet was denied.  Called [facility] and spoke with Mechele Collin to try to figure out why PA was denied.  In regards to the PA, due to one of the responses being stated wrong during the verbal PA, this is why the PA was denied. I stated that after looking at the information to why the PA was denied, the response where it asked if other causes of lung disease had been ruled out, stated that the response should have been yes instead of no.  Per Mechele Collin, we could redo the PA to see if we could get approval.  Verbal PA was reinitiated with expedited appeals dept.  After redoing the PA, the PA for Bradley Rice's Esbriet was approved and has been approved until 09/11/2019. Case #: [id number]  Bradley Gallery ([place]) and spoke with Bradley Rice. Per Bradley Rice, Bradley Rice just needs to contact the pharmacy to see if anything else is required.  Called and spoke with Bradley Rice letting him know all the above information and provided patient with the phone number to the pharmacy as well as the PA case reference number that I was given so he could give it to the pharmacy if asked. Bradley Rice stated he would call the pharmacy to see if he could schedule delivery. Nothing further needed.

## 2018-09-10 NOTE — Telephone Encounter (Signed)
Spoke with patient. He stated that he received an urgent call from [place] today. Reviewed patient's chart, saw where a PA was pending for his Esbriet.   Raquel Sarna, do you remember calling this patient today? Please advise.

## 2018-09-10 NOTE — Telephone Encounter (Signed)
Called and spoke with Jenny Reichmann. Stated to Jenny Reichmann that we are still awaiting the results of the PA that was initiated for pt's Esbriet. John expressed understanding. Will update John once I have the status of the PA.

## 2018-09-10 NOTE — Telephone Encounter (Signed)
I did not call pt today. The only person I called was John [place] to let him know that we are still awaiting response on the PA.

## 2018-09-10 NOTE — Telephone Encounter (Signed)
Bradley Rice, calling about status of this PA, advised per last message awaiting response of PA, he is requesting CB at [phone].

## 2018-09-10 NOTE — Telephone Encounter (Signed)
Pt is calling back [phone]

## 2018-09-13 DIAGNOSIS — Z79899 Other long term (current) drug therapy: Secondary | ICD-10-CM | POA: Diagnosis not present

## 2018-09-25 ENCOUNTER — Other Ambulatory Visit: Payer: Self-pay | Admitting: Family

## 2018-10-02 ENCOUNTER — Telehealth: Payer: Self-pay | Admitting: Internal Medicine

## 2018-10-02 DIAGNOSIS — R911 Solitary pulmonary nodule: Secondary | ICD-10-CM

## 2018-10-02 DIAGNOSIS — J849 Interstitial pulmonary disease, unspecified: Secondary | ICD-10-CM

## 2018-10-02 NOTE — Telephone Encounter (Signed)
Order for CT WO Contrast has been cancelled and order has been placed for HRCT. Nothing further needed.

## 2018-10-02 NOTE — Telephone Encounter (Signed)
D/w Bradley Rice upcoming regular ct chestt. Patient has ILD and 55mm nodule in mid oct 2019 CT  So, with mid march 2020 visit - do HRCT to cover both indications above

## 2018-10-08 DIAGNOSIS — R634 Abnormal weight loss: Secondary | ICD-10-CM | POA: Diagnosis not present

## 2018-10-12 DIAGNOSIS — Z79899 Other long term (current) drug therapy: Secondary | ICD-10-CM | POA: Diagnosis not present

## 2018-10-16 ENCOUNTER — Ambulatory Visit (HOSPITAL_COMMUNITY)
Admission: RE | Admit: 2018-10-16 | Discharge: 2018-10-16 | Disposition: A | Discharge status: Home / Self Care | Payer: Medicaid Other | Source: Ambulatory Visit | Attending: Internal Medicine | Admitting: Internal Medicine

## 2018-10-16 ENCOUNTER — Other Ambulatory Visit: Payer: Self-pay

## 2018-10-16 DIAGNOSIS — R911 Solitary pulmonary nodule: Secondary | ICD-10-CM | POA: Diagnosis not present

## 2018-10-16 DIAGNOSIS — J849 Interstitial pulmonary disease, unspecified: Secondary | ICD-10-CM | POA: Diagnosis not present

## 2018-10-23 ENCOUNTER — Telehealth: Payer: Self-pay | Admitting: Internal Medicine

## 2018-10-23 MED ORDER — TIOTROPIUM BROMIDE MONOHYDRATE 2.5 MCG/ACT IN AERS: 2.0000 | INHALATION_SPRAY | Freq: Every day | RESPIRATORY_TRACT | 3 refills | Status: DC

## 2018-10-23 MED ORDER — BUDESONIDE-FORMOTEROL FUMARATE 160-4.5 MCG/ACT IN AERO: 2.0000 | INHALATION_SPRAY | Freq: Two times a day (BID) | RESPIRATORY_TRACT | 3 refills | Status: DC

## 2018-10-23 MED ORDER — TIOTROPIUM BROMIDE MONOHYDRATE 18 MCG IN CAPS: 18.0000 ug | ORAL_CAPSULE | Freq: Every day | RESPIRATORY_TRACT | 6 refills | Status: DC

## 2018-10-23 NOTE — Telephone Encounter (Signed)
Spoke with patient to cancel his upcoming PFT and ILD clinic appt with MR Patient stated he will take his last dose Spiriva today and has 5 days left of Symbicort Advised patient will send refills to verified pharmacy Nothing further needed; will sign off

## 2018-10-23 NOTE — Telephone Encounter (Signed)
Call and spoke to pharmacy tech at [facility], insurance is medicaid and they will only cover spiriva handihaler. Requesting new script to be sent in. Prescription sent in. Nothing further is needed at this time.

## 2018-10-25 ENCOUNTER — Ambulatory Visit: Payer: Medicaid Other | Admitting: Internal Medicine

## 2018-11-08 DIAGNOSIS — R634 Abnormal weight loss: Secondary | ICD-10-CM | POA: Diagnosis not present

## 2018-11-12 DIAGNOSIS — G894 Chronic pain syndrome: Secondary | ICD-10-CM | POA: Diagnosis not present

## 2018-11-12 DIAGNOSIS — Z9189 Other specified personal risk factors, not elsewhere classified: Secondary | ICD-10-CM | POA: Diagnosis not present

## 2018-11-12 DIAGNOSIS — Z79899 Other long term (current) drug therapy: Secondary | ICD-10-CM | POA: Diagnosis not present

## 2018-11-12 DIAGNOSIS — M5136 Other intervertebral disc degeneration, lumbar region: Secondary | ICD-10-CM | POA: Diagnosis not present

## 2018-12-08 DIAGNOSIS — R634 Abnormal weight loss: Secondary | ICD-10-CM | POA: Diagnosis not present

## 2018-12-13 DIAGNOSIS — Z79891 Long term (current) use of opiate analgesic: Secondary | ICD-10-CM | POA: Diagnosis not present

## 2019-01-09 DIAGNOSIS — M542 Cervicalgia: Secondary | ICD-10-CM | POA: Diagnosis not present

## 2019-01-09 DIAGNOSIS — Z79891 Long term (current) use of opiate analgesic: Secondary | ICD-10-CM | POA: Diagnosis not present

## 2019-01-09 DIAGNOSIS — M5136 Other intervertebral disc degeneration, lumbar region: Secondary | ICD-10-CM | POA: Diagnosis not present

## 2019-01-09 DIAGNOSIS — Z79899 Other long term (current) drug therapy: Secondary | ICD-10-CM | POA: Diagnosis not present

## 2019-01-09 DIAGNOSIS — G894 Chronic pain syndrome: Secondary | ICD-10-CM | POA: Diagnosis not present

## 2019-01-14 ENCOUNTER — Emergency Department (HOSPITAL_COMMUNITY)
Admission: EM | Admit: 2019-01-14 | Discharge: 2019-01-14 | Disposition: A | Discharge status: Home / Self Care | Payer: Medicaid Other | Attending: Emergency Medicine | Admitting: Emergency Medicine

## 2019-01-14 ENCOUNTER — Encounter (HOSPITAL_COMMUNITY): Payer: Self-pay | Admitting: Emergency Medicine

## 2019-01-14 ENCOUNTER — Other Ambulatory Visit: Payer: Self-pay

## 2019-01-14 DIAGNOSIS — R634 Abnormal weight loss: Secondary | ICD-10-CM | POA: Diagnosis not present

## 2019-01-14 DIAGNOSIS — H109 Unspecified conjunctivitis: Secondary | ICD-10-CM | POA: Diagnosis not present

## 2019-01-14 DIAGNOSIS — J449 Chronic obstructive pulmonary disease, unspecified: Secondary | ICD-10-CM | POA: Diagnosis not present

## 2019-01-14 DIAGNOSIS — I10 Essential (primary) hypertension: Secondary | ICD-10-CM | POA: Insufficient documentation

## 2019-01-14 DIAGNOSIS — H5789 Other specified disorders of eye and adnexa: Secondary | ICD-10-CM | POA: Diagnosis present

## 2019-01-14 DIAGNOSIS — Z87891 Personal history of nicotine dependence: Secondary | ICD-10-CM | POA: Diagnosis not present

## 2019-01-14 DIAGNOSIS — Z79899 Other long term (current) drug therapy: Secondary | ICD-10-CM | POA: Insufficient documentation

## 2019-01-14 DIAGNOSIS — I2510 Atherosclerotic heart disease of native coronary artery without angina pectoris: Secondary | ICD-10-CM | POA: Insufficient documentation

## 2019-01-14 MED ORDER — FLUORESCEIN SODIUM 1 MG OP STRP
1.0000 | ORAL_STRIP | Freq: Once | OPHTHALMIC | Status: AC
  Administered 2019-01-14: 1 via OPHTHALMIC
  Filled 2019-01-14: qty 1

## 2019-01-14 MED ORDER — TETRACAINE HCL 0.5 % OP SOLN
2.0000 [drp] | Freq: Once | OPHTHALMIC | Status: AC
  Administered 2019-01-14: 2 [drp] via OPHTHALMIC
  Filled 2019-01-14: qty 4

## 2019-01-14 MED ORDER — TOBRAMYCIN 0.3 % OP SOLN
2.0000 [drp] | Freq: Once | OPHTHALMIC | Status: AC
  Administered 2019-01-14: 2 [drp] via OPHTHALMIC
  Filled 2019-01-14: qty 5

## 2019-01-14 MED ORDER — ACETAMINOPHEN 500 MG PO TABS
1000.0000 mg | ORAL_TABLET | Freq: Once | ORAL | Status: AC
  Administered 2019-01-14: 1000 mg via ORAL
  Filled 2019-01-14: qty 2

## 2019-01-14 NOTE — ED Provider Notes (Signed)
[facility] EMERGENCY DEPARTMENT Provider Note   CSN: [id number] Arrival date & time: 01/14/19  2992     History   Chief Complaint Chief Complaint  Patient presents with  . Eye Problem    HPI Bradley Rice is a 58 y.o. male.     Patient is a 58 year old male who states that on Friday he was cutting his grass and then blowing some materials off of his porch and deck.  He was wearing safety goggles, but something blew in his eye.  He has tried flushing.  He is tried eyedrops.  He is even tried "numbing drops".  He says the numbing drops help with the discomfort, but when they wear off he still feels as though there is something in his left eye.  He has had some mild blurring of his vision, and he has had increased mucus of the left eye.  The history is provided by the patient.  Eye Problem Location:  Left eye Quality:  Foreign body sensation Severity:  Moderate Onset quality:  Sudden Duration:  3 days Progression:  Worsening Chronicity:  New Context: foreign body   Foreign body:  Unknown Relieved by:  Nothing Worsened by:  Nothing Ineffective treatments:  Flushing and eye drops Associated symptoms: blurred vision, discharge, redness and tearing   Associated symptoms: no photophobia   Risk factors: no conjunctival hemorrhage and no previous injury to eye     Past Medical History:  Diagnosis Date  . ALS (amyotrophic lateral sclerosis) ([place])   . Back pain   . Cancer ([place])    lung cancer  . COPD (chronic obstructive pulmonary disease) ([place])   . Coronary artery disease   . DDD (degenerative disc disease), lumbar   . Depression   . Dysphagia    patient reports d/t surgery, has modified diet, has to tilt head to swallow   . History of blood transfusion 2014   post op bleed  . Migraine   . Pulmonary fibrosis ([place])   . Spider bite 2012  . Stroke [facility])    11/2013    Patient Active Problem List   Diagnosis Date Noted  . Bronchitis 08/16/2018  . Apnea 07/15/2018  .  Pulmonary fibrosis ([place]) 07/15/2018  . Pulmonary nodules 07/15/2018  . Chronic back pain 04/30/2018  . Chronic pain 03/15/2018  . Hypertension 09/30/2016  . ALS (amyotrophic lateral sclerosis) ([place])   . GAD (generalized anxiety disorder) 02/17/2015  . Depression 02/17/2015  . Current smoker 02/17/2015  . Postlaminectomy syndrome, cervical region 08/15/2014  . Cervical dystonia 06/13/2014  . Cervicogenic headache 05/09/2014  . TIA (transient ischemic attack) 12/28/2013  . Cervical spondylosis with radiculopathy 12/17/2013  . ED (erectile dysfunction) 11/21/2013  . Multilevel spondylosis 06/25/2012  . 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 Carpal tunnel syndrome of left wrist 06/25/2012    Class: Chronic    Past Surgical History:  Procedure Laterality Date  . ANTERIOR CERVICAL DECOMP/DISCECTOMY FUSION  12/22/2011   Procedure: ANTERIOR CERVICAL DECOMPRESSION/DISCECTOMY FUSION 3 LEVELS;  Surgeon: Melina Schools, MD;  Location: [place];  Service: Orthopedics;  Laterality: N/A;  ACDF C5-T1  . ANTERIOR CERVICAL DECOMP/DISCECTOMY FUSION  06/25/2012   Procedure: ANTERIOR CERVICAL DECOMPRESSION/DISCECTOMY FUSION 1 LEVEL/HARDWARE REMOVAL;  Surgeon: [name], MD;  Location: [place];  Service: Orthopedics;  Laterality: N/A;  Removal anterior cervical plate C5-T1, Explore Fusion, Left C5-6, C6-7, C7-T1 Re-do Foraminotomy, Left open carpal tunnel release with release of ulnar nerve at [facility], Posterior Cervical Fusion with lateral  mass screw  . BACK SURGERY    . CARDIAC CATHETERIZATION    . CARPAL TUNNEL RELEASE  06/25/2012   Procedure: CARPAL TUNNEL RELEASE;  Surgeon: [name], MD;  Location: [place];  Service: Orthopedics;  Laterality: Left;  as above  . CHOLECYSTECTOMY  2009  . CORONARY ANGIOPLASTY  2001   [facility]  . LUMBAR FUSION  2000  . POSTERIOR CERVICAL FUSION/FORAMINOTOMY  06/25/2012   Procedure: POSTERIOR CERVICAL FUSION/FORAMINOTOMY LEVEL 1;  Surgeon: [name], MD;   Location: [place];  Service: Orthopedics;  Laterality: N/A;  as above  . POSTERIOR CERVICAL FUSION/FORAMINOTOMY N/A 12/17/2013   Procedure: REMOVAL OF POSTERIOR CERVICAL FUSION LATERAL MASS SCREWS AND RODS C5-T1, EXPLORE FUSION, LEFT SIX-SEVEN FORAMINOTOMY;  Surgeon: [name], MD;  Location: [place];  Service: Orthopedics;  Laterality: N/A;  . vasectomy  1990        Home Medications    Prior to Admission medications   Medication Sig Start Date End Date Taking? Authorizing Provider  albuterol (PROVENTIL HFA;VENTOLIN HFA) 108 (90 Base) MCG/ACT inhaler Inhale 2 puffs into the lungs every 6 (six) hours as needed for wheezing or shortness of breath. 11/17/16   Evelina Dun A, FNP  albuterol (PROVENTIL) (2.5 MG/3ML) 0.083% nebulizer solution Take 3 mLs (2.5 mg total) by nebulization every 6 (six) hours as needed for wheezing or shortness of breath. 10/17/16   Sharion Balloon, FNP  baclofen (LIORESAL) 10 MG tablet Take 1 tablet (10 mg total) by mouth 3 (three) times daily. 03/15/18   Sharion Balloon, FNP  budesonide-formoterol (SYMBICORT) 160-4.5 MCG/ACT inhaler Inhale 2 puffs into the lungs 2 (two) times daily. 07/06/18   Martyn Ehrich, NP  budesonide-formoterol [facility]) 160-4.5 MCG/ACT inhaler Inhale 2 puffs into the lungs 2 (two) times daily. 10/23/18   Brand Males, MD  methocarbamol (ROBAXIN) 750 MG tablet Take 1 tablet (750 mg total) by mouth 2 (two) times daily as needed. 05/17/18   Evelina Dun A, FNP  metoprolol succinate (TOPROL-XL) 50 MG 24 hr tablet Take 1 tablet (50 mg total) by mouth daily. 10/11/17   Evelina Dun A, FNP  Nutritional Supplements (ENSURE HIGH PROTEIN) LIQD Take 237 mLs by mouth 2 (two) times daily. 05/01/18   Evelina Dun A, FNP  oxyCODONE (ROXICODONE) 15 MG immediate release tablet Take 15 mg by mouth 4 (four) times daily. 06/22/18   [provider]  sildenafil (REVATIO) 20 MG tablet Take 2-5 tabs PRN prior to sexual activity. 08/25/16   [provider]  tiotropium (SPIRIVA) 18 MCG inhalation capsule Place 1 capsule (18 mcg total) into inhaler and inhale daily. 10/23/18   Brand Males, MD  Tiotropium Bromide Monohydrate (SPIRIVA RESPIMAT) 2.5 MCG/ACT AERS Inhale 2 puffs into the lungs daily. 08/23/18   Brand Males, MD  Tiotropium Bromide Monohydrate (SPIRIVA RESPIMAT) 2.5 MCG/ACT AERS Inhale 2 puffs into the lungs daily. 10/23/18   Brand Males, MD  topiramate (TOPAMAX) 50 MG tablet Take 2 tabs BID 08/01/15 08/07/19  [provider]    Family History Family History  Problem Relation Age of Onset  . ALS Mother   . Cancer Mother   . Cancer Sister   . ALS Sister   . Cancer Brother        1 brother  . Anesthesia problems Neg Hx     Social History Social History   Tobacco Use  . Smoking status: Former Smoker    Packs/day: 1.00    Years: 30.00  Pack years: 30.00    Types: Cigarettes    Quit date: 2013    Years since quitting: 7.4  . Smokeless tobacco: Never Used  Substance Use Topics  . Alcohol use: No    Alcohol/week: 0.0 standard drinks  . Drug use: No     Allergies   Patient has no known allergies.   Review of Systems Review of Systems  Constitutional: Negative for activity change.       All ROS Neg except as noted in HPI  HENT: Negative for nosebleeds.   Eyes: Positive for blurred vision, discharge and redness. Negative for photophobia.  Respiratory: Negative for cough, shortness of breath and wheezing.   Cardiovascular: Negative for chest pain and palpitations.  Gastrointestinal: Negative for abdominal pain and blood in stool.  Genitourinary: Negative for dysuria, frequency and hematuria.  Musculoskeletal: Negative for arthralgias, back pain and neck pain.  Skin: Negative.   Neurological: Negative for dizziness, seizures and speech difficulty.  Psychiatric/Behavioral: Negative for confusion and hallucinations.     Physical Exam Updated Vital Signs BP 114/70 (BP  Location: Right Arm)   Pulse 93   Temp 98.1 F (36.7 C) (Oral)   Resp 16   Ht 6' (1.829 m)   Wt 68.1 kg   SpO2 97%   BMI 20.36 kg/m   Physical Exam Vitals signs and nursing note reviewed.  Constitutional:      Appearance: He is well-developed. He is not toxic-appearing.  HENT:     Head: Normocephalic.     Right Ear: Tympanic membrane and external ear normal.     Left Ear: Tympanic membrane and external ear normal.  Eyes:     General: Lids are normal.     Extraocular Movements: Extraocular movements intact.     Pupils: Pupils are equal, round, and reactive to light.     Comments: The extraocular movements are intact.  The anterior chambers on the left are clear.  There is increased redness involving the conjunctiva and the bulbar conjunctiva.  The left lower lid shows 4 very small white raised areas of the bulbar conjunctiva.  There is a small amount of mucus that remains in the eyelashes.  There is no foreign body or other problems noted of the upper lid other than some mild redness of the bulbar conjunctiva of the left upper lid. There is no foreign body noted.  There is no corneal ulcer noted.  There is no hypopyon appreciated.  Neck:     Musculoskeletal: Normal range of motion and neck supple.     Vascular: No carotid bruit.  Cardiovascular:     Rate and Rhythm: Normal rate and regular rhythm.     Pulses: Normal pulses.     Heart sounds: Normal heart sounds.  Pulmonary:     Effort: No respiratory distress.     Breath sounds: Normal breath sounds.  Abdominal:     General: Bowel sounds are normal.     Palpations: Abdomen is soft.     Tenderness: There is no abdominal tenderness. There is no guarding.  Musculoskeletal: Normal range of motion.  Lymphadenopathy:     Head:     Right side of head: No submandibular adenopathy.     Left side of head: No submandibular adenopathy.     Cervical: No cervical adenopathy.  Skin:    General: Skin is warm and dry.  Neurological:      Mental Status: He is alert and oriented to person, place, and time.  Cranial Nerves: No cranial nerve deficit.     Sensory: No sensory deficit.  Psychiatric:        Speech: Speech normal.      ED Treatments / Results  Labs (all labs ordered are listed, but only abnormal results are displayed) Labs Reviewed - No data to display  EKG    Radiology No results found.  Procedures Procedures (including critical care time)  Medications Ordered in ED Medications - No data to display   Initial Impression / Assessment and Plan / ED Course  I have reviewed the triage vital signs and the nursing notes.  Pertinent labs & imaging results that were available during my care of the patient were reviewed by me and considered in my medical decision making (see chart for details).          Final Clinical Impressions(s) / ED Diagnoses MDM  Vital signs reviewed.  Visual acuity reviewed.  The patient has increased redness of the conjunctiva and the bulbar conjunctiva on the left.  There are 4 small slightly raised white areas noted of the bulbar conjunctiva of the left lower lid.  No other drainage or problem appreciated.  There is no foreign body noted of the upper or lower lid.  There is no foreign body noted in the cornea.  I have asked the patient to use tobramycin every 4 hours for the next 5 days.  Have asked him to see his eye specialist tomorrow or as soon as possible concerning the slightly raised white areas of the left lower lid.  I have suggested cool compresses.  The patient is to see the eye specialist or return to the emergency department immediately if any unusual changes in his vision, increase intensity of pain, unusual swelling, changes in his condition, worsening of his symptoms, problems or concerns.  Patient is in agreement with this plan.  The patient is also advised to leave his contacts out until seen by the eye specialist.   Final diagnoses:  Conjunctivitis of  left eye, unspecified conjunctivitis type    ED Discharge Orders    None       Lily Kocher, PA-C 01/14/19 1129    Fredia Sorrow, MD 01/15/19 [phone]

## 2019-01-14 NOTE — Discharge Instructions (Addendum)
Your vital signs are within normal limits.  Your visual acuity has been reviewed.  Your examination suggest a conjunctivitis present.  There are 4 very small white raised areas of the left lower lid accompanied by increased redness of the conjunctiva of the lid as well as conjunctiva of the eye itself.  Please use 2 drops of tobramycin in the left eye every 4 hours over the next 5 days.  Please see your eye specialist tomorrow or soon as possible for further evaluation of this.  Please do not put your contacts in until after you have been given the go ahead with your eye specialist.  Cool compresses may be helpful.  Please try to keep your hands out of your eye is much as possible.

## 2019-01-14 NOTE — ED Triage Notes (Signed)
Pt states he got something in his left eye on Friday while mowing and has been irritated since.

## 2019-01-15 ENCOUNTER — Telehealth: Payer: Self-pay | Admitting: Family

## 2019-01-15 DIAGNOSIS — F17200 Nicotine dependence, unspecified, uncomplicated: Secondary | ICD-10-CM

## 2019-01-15 DIAGNOSIS — R634 Abnormal weight loss: Secondary | ICD-10-CM

## 2019-01-15 DIAGNOSIS — R911 Solitary pulmonary nodule: Secondary | ICD-10-CM

## 2019-01-17 MED ORDER — ENSURE HIGH PROTEIN PO LIQD: 237.0000 mL | Freq: Three times a day (TID) | ORAL | 5 refills | Status: DC

## 2019-01-17 NOTE — Telephone Encounter (Signed)
Prescription sent to pharmacy.

## 2019-01-17 NOTE — Telephone Encounter (Signed)
Aware. Message left for patient ,  Script sent in.

## 2019-02-11 DIAGNOSIS — Z79899 Other long term (current) drug therapy: Secondary | ICD-10-CM | POA: Diagnosis not present

## 2019-02-11 DIAGNOSIS — M5136 Other intervertebral disc degeneration, lumbar region: Secondary | ICD-10-CM | POA: Diagnosis not present

## 2019-02-11 DIAGNOSIS — Z9189 Other specified personal risk factors, not elsewhere classified: Secondary | ICD-10-CM | POA: Diagnosis not present

## 2019-02-11 DIAGNOSIS — G894 Chronic pain syndrome: Secondary | ICD-10-CM | POA: Diagnosis not present

## 2019-02-15 DIAGNOSIS — R634 Abnormal weight loss: Secondary | ICD-10-CM | POA: Diagnosis not present

## 2019-03-12 DIAGNOSIS — Z79899 Other long term (current) drug therapy: Secondary | ICD-10-CM | POA: Diagnosis not present

## 2019-03-12 DIAGNOSIS — Z79891 Long term (current) use of opiate analgesic: Secondary | ICD-10-CM | POA: Diagnosis not present

## 2019-03-12 DIAGNOSIS — M5136 Other intervertebral disc degeneration, lumbar region: Secondary | ICD-10-CM | POA: Diagnosis not present

## 2019-03-12 DIAGNOSIS — Z760 Encounter for issue of repeat prescription: Secondary | ICD-10-CM | POA: Diagnosis not present

## 2019-03-12 DIAGNOSIS — G894 Chronic pain syndrome: Secondary | ICD-10-CM | POA: Diagnosis not present

## 2019-03-18 DIAGNOSIS — R634 Abnormal weight loss: Secondary | ICD-10-CM | POA: Diagnosis not present

## 2019-04-19 ENCOUNTER — Telehealth: Payer: Self-pay | Admitting: Internal Medicine

## 2019-04-19 NOTE — Telephone Encounter (Signed)
Spoke with pt and made an appt for spirometry and dlco and OV with MR. Nothing further is needed.

## 2019-04-19 NOTE — Telephone Encounter (Signed)
Seen in ILD clinic early March 2020.  After that he had high-resolution CT chest mid March 2020.  Then subsequently not seen because of the pandemic.  He requires a 30-minute ILD  follow-up after doing spirometry and DLCO.  He can see me in the next few to several weeks  Thanks    SIGNATURE    Dr. Brand Males, M.D., F.C.C.P,  Pulmonary and Critical Care Medicine Staff Physician, [place] Director - Interstitial Lung Disease  Program  Pulmonary [place] at [place], [place], [address]  Pager: [phone], If no answer or between  15:00h - 7:00h: call [phone] Telephone: [phone]  4:23 PM 04/19/2019

## 2019-04-25 DIAGNOSIS — R634 Abnormal weight loss: Secondary | ICD-10-CM | POA: Diagnosis not present

## 2019-05-07 DIAGNOSIS — Z79899 Other long term (current) drug therapy: Secondary | ICD-10-CM | POA: Diagnosis not present

## 2019-05-07 DIAGNOSIS — M5136 Other intervertebral disc degeneration, lumbar region: Secondary | ICD-10-CM | POA: Diagnosis not present

## 2019-05-07 DIAGNOSIS — G894 Chronic pain syndrome: Secondary | ICD-10-CM | POA: Diagnosis not present

## 2019-05-07 DIAGNOSIS — Z23 Encounter for immunization: Secondary | ICD-10-CM | POA: Diagnosis not present

## 2019-05-08 ENCOUNTER — Other Ambulatory Visit: Payer: Self-pay | Admitting: Internal Medicine

## 2019-05-27 ENCOUNTER — Other Ambulatory Visit (HOSPITAL_COMMUNITY)
Admission: RE | Admit: 2019-05-27 | Discharge: 2019-05-27 | Disposition: A | Discharge status: Home / Self Care | Payer: Medicaid Other | Source: Ambulatory Visit | Attending: Internal Medicine | Admitting: Internal Medicine

## 2019-05-27 DIAGNOSIS — Z20828 Contact with and (suspected) exposure to other viral communicable diseases: Secondary | ICD-10-CM | POA: Insufficient documentation

## 2019-05-27 DIAGNOSIS — Z01812 Encounter for preprocedural laboratory examination: Secondary | ICD-10-CM | POA: Insufficient documentation

## 2019-05-27 LAB — SARS CORONAVIRUS 2 (TAT 6-24 HRS): SARS Coronavirus 2: NEGATIVE

## 2019-05-30 ENCOUNTER — Encounter: Payer: Self-pay | Admitting: Internal Medicine

## 2019-05-30 ENCOUNTER — Other Ambulatory Visit: Payer: Self-pay

## 2019-05-30 ENCOUNTER — Ambulatory Visit (INDEPENDENT_AMBULATORY_CARE_PROVIDER_SITE_OTHER): Payer: Medicaid Other | Admitting: Internal Medicine

## 2019-05-30 ENCOUNTER — Ambulatory Visit: Payer: Medicaid Other | Admitting: Internal Medicine

## 2019-05-30 VITALS — BP 118/76 | HR 95 | Temp 98.1°F | Ht 71.5 in | Wt 138.0 lb

## 2019-05-30 DIAGNOSIS — R059 Cough, unspecified: Secondary | ICD-10-CM

## 2019-05-30 DIAGNOSIS — J84112 Idiopathic pulmonary fibrosis: Secondary | ICD-10-CM

## 2019-05-30 DIAGNOSIS — J439 Emphysema, unspecified: Secondary | ICD-10-CM

## 2019-05-30 DIAGNOSIS — R05 Cough: Secondary | ICD-10-CM | POA: Diagnosis not present

## 2019-05-30 LAB — PULMONARY FUNCTION TEST
DL/VA % pred: 73 %
DL/VA: 3.16 ml/min/mmHg/L
DLCO unc % pred: 43 %
DLCO unc: 12.85 ml/min/mmHg
FEF 25-75 Pre: 2.31 L/sec
FEF2575-%Pred-Pre: 69 %
FEV1-%Pred-Pre: 64 %
FEV1-Pre: 2.55 L
FEV1FVC-%Pred-Pre: 101 %
FEV6-%Pred-Pre: 66 %
FEV6-Pre: 3.3 L
FEV6FVC-%Pred-Pre: 104 %
FVC-%Pred-Pre: 64 %
FVC-Pre: 3.3 L
Pre FEV1/FVC ratio: 77 %
Pre FEV6/FVC Ratio: 100 %

## 2019-05-30 LAB — HEPATIC FUNCTION PANEL
ALT: 12 U/L (ref 0–53)
AST: 15 U/L (ref 0–37)
Albumin: 4.2 g/dL (ref 3.5–5.2)
Alkaline Phosphatase: 64 U/L (ref 39–117)
Bilirubin, Direct: 0.1 mg/dL (ref 0.0–0.3)
Total Bilirubin: 0.3 mg/dL (ref 0.2–1.2)
Total Protein: 8.1 g/dL (ref 6.0–8.3)

## 2019-05-30 NOTE — Progress Notes (Addendum)
OV Nov 2017 History of Present Illness Bradley Rice is a 58 y.o. male for evaluation of sleep problems.  He has noticed trouble falling asleep and staying asleep.  He feels some of this is related to neck and back pain.  His wife has told him that he snores, and will stop breathing while asleep.  He has a hard time slowing things down when he is trying to sleep.  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  He reports not being seen by a neurologist for past 2 years.  He also reports history of non small cell lung cancer from about 2 years ago.  He was told the ALS therapy and cancer therapy couldn't be given together, so he stopped both therapies.  He reports seeing a lung doctor about 2 years ago, but was only told that he had lung cancer but no other lung disease that he is aware of.  He had all these appointments in [place], and he thinks these were at [facility]  He goes to sleep at 10 pm.  He falls asleep after 2 hours.  He wakes up 3 or 4 times, and then has trouble falling back to sleep.  He gets out of bed at 7 am.  He feels tired in the morning.  He denies morning headache.  He does not use anything to help him fall sleep or stay awake.  He denies sleep walking, sleep talking, bruxism, or nightmares.  There is no history of restless legs.  He denies sleep hallucinations, sleep paralysis, or cataplexy.  The Epworth score is 9 out of 24.   07/06/2018  Patient presents today to follow-up on pulmonary nodules. He was seen back in 2017 by Dr. Halford Chessman and ordered for ONO, full pulmonary function testing, labs and serology. Former smoker, smoked 1ppd from the age [age]. Reports quitting in 2012.   Complains of coughing fits with clear mucus x 68months. He experiences shortness of breath when coughing. States that he coughs so much at night that he throws up and reports that he stops breathing. Rescue inhaler helps  some. Unsure if he is using Symbicort twice daily as prescribed. Stopped breo.   CT Chest 05/15/18- Re-demonstration bilateral lung nodules, no new or enlarging nodules since June 2019. 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 Recommend follow up 6-12 months. Follow CT out for 2 years. Refer to ILD clinic. Refer to lung cancer screening.   Exposure history: Work- [employer], he was around fumes and gas without a mask (on disability for neck/back pain) NO birds, cats/dogs Has carpet No hot tub No beach house  Spirometry 07/06/2018 FVC 3.1 (59%), FEV1 2.7 (67%), ratio  87 (unable to complete)  Pulmonary tests CT angio chest 12/23/15 >> mild paraseptal emphysema, progressive fibrosis, several b/l nodules up to 8 mm     OV 08/23/2018  Subjective:  Patient ID: Bradley Rice, male , DOB: [birth year] , age 66 y.o. , MRN: [id number] , ADDRESS: [place] [id number]   08/23/2018 -   Chief Complaint  Patient presents with   Follow-up    PFT not performed.  Pt states he is getting better and states he is no longer running a fever. Pt states that he does have complaints of SOB, cough with occ white phlegm, and CP.     HPI Bradley Rice 58  y.o. -presents to the ILD clinic as a second opinion referral from Dr. Ricarda Frame nurse practitioner.  He has a diagnosis of ALS and under follow-up at [facility].  He is still fairly functional and that he can do his activities of daily living.  He does not have any dysphagia.  He has now developed interstitial lung disease on top of his underlying emphysema which she is known to have.  Therefore has been referred here.  He tells me that he has had insidious worsening of shortness of breath for the last year or so.  He has difficulty doing activities of daily living because of dyspnea.  He does not believe his ALS is causing his dyspnea.  He also has significant severe chronic  cough but sometimes causes near presyncope.  He is only on Symbicort for his previous diagnosis of emphysema.  He was last seen in our practice in 2017 but after that reestablished in the last few months during which time the ILD was discovered and he is now been referred to the ILD clinic.  He reports having answered all the questions on interstitial lung disease questionnaire but this questionnaire is now missing with his answers.  Therefore he had to redo. Does not want to do it 08/23/18    CT scan of the chest January 24, 2018 in my personal visualization shows classic UIP findings.  This includes bilateral bibasal subpleural disease with honeycombing and traction bronchiectasis.  Further up in the lungs is emphysema with subpleural bulla as opposed to honeycombing.  These findings predate 2017.  Looking back he might have early ILD changes even in 2014 CT chest.    Results for BENJIMAN, SEDGWICK (MRN [id number]) as of 08/23/2018 09:45  Ref. Range 06/10/2016 14:48 07/06/2018 10:53  Anti Nuclear Antibody(ANA) Latest Ref Range: NEGATIVE  Negative NEGATIVE  Cyclic Citrullin Peptide Ab Latest Units: UNITS <16 <16  ds DNA Ab Latest Units: IU/mL  <1  Jo-1 Antibody, IgG Latest Ref Range: <1.0 NEG AI  <1.0 NEG   RA Latex Turbid. Latest Ref Range: <14 IU/mL <14 <14  SSA (Ro) (ENA) Antibody, IgG Latest Ref Range: <1.0 NEG AI <1.0 NEG <1.0 NEG  SSB (La) (ENA) Antibody, IgG Latest Ref Range: <1.0 NEG AI <1.0 NEG <1.0 NEG  Scleroderma (Scl-70) (ENA) Antibody, IgG Latest Ref Range: <1.0 NEG AI <1.0 NEG <1.0 NEG    OV 05/30/2019  Subjective:  Patient ID: Bradley Rice, male , DOB: 07-24-1961 , age 39 y.o. , MRN: [id number] , ADDRESS: [place] [id number]   05/30/2019 -   Chief Complaint  Patient presents with   Follow-up    Patient reports that he still has sob with exertion and occ. cough.    Follow-up IPF [diagnosis given January 2020, clinical diagnosis, based on male gender, clubbing, family  history, smoking history, negative serology and associated emphysema] with associated emphysema in the setting of associated ALS.  Delays with Esbriet start in early 2020  HPI JOHNTHOMAS LADER 58 y.o. -presents for follow-up.  Last seen in early 2020.  After that lost to follow-up because of the COVID-19 pandemic.  He tells me that since early this year he is significantly worse with shortness of breath and cough.  His symptom scores are listed below and is significant.  He says his cough is relieved only by using his mom's oxygen.  I find out that his mom also suffers from pulmonary fibrosis and sees somebody  at [facility].  He says the shortness of breath is also relieved by his mom's portable oxygen.  He is very frustrated by his symptoms.  He states his lung is full of crackles.  Apparently try to attend pulmonary rehabilitation earlier in 2020 had any pain but because of his ALS was deemed unsafe.  I am not able to find the documentation on this.  At last visit I started him on pirfenidone/Esbriet.  He tells me that he took a month supply of this.  He says he is frustrated by our office there was significant delays in getting him the [place] at proper pricing.  Apparently he had to pay a co-pay $1500 but later this got reduced to $500.  Nevertheless it was too expensive.  Currently is just on supportive care   He is not on any oxygen therapy.  He is using his mom's oxygen.  This is based on no evidence of desaturation.  However he tells me this is the main thing that helps his cough and shortness of breath.  He is willing to see go through overnight oxygen testing and see if he desaturates and if so start oxygen and see if his cough improves.  He says that cough medicines have not helped.  He was asking for stronger medication but after discussion he is willing to wait.  Discussed research concepts  Overall in terms of his ALS it is stable  He has had his flu shot.   He also has emphysema: He was  on an inhaled anticholinergic but he is run out and not taking.  SYMPTOM SCALE - ILD 05/30/2019   O2 use ra  Shortness of Breath 0 -> 5 scale with 5 being worst (score 6 If unable to do)  At rest 4  Simple tasks - showers, clothes change, eating, shaving 4  Household (dishes, doing bed, laundry) 4  Shopping 5  Walking level at own pace 5  Walking keeping up with others of same age 78  Walking up Stairs 5  Walking up [place] 5  Total (40 - 48) Dyspnea Score 37  How bad is your cough? 5  How bad is your fatigue 4         Simple office walk 185 feet x  3 laps goal with forehead probe 08/23/2018  05/30/2019   O2 used Room air Room air  Number laps completed 3 3  Comments about pace Slow pace* steady  Resting Pulse Ox/HR 99% and 93/min 98% and HR 96/min  Final Pulse Ox/HR 98% and 110/min 98% and HR 105/mn  Desaturated </= 88% no no  Desaturated <= 3% points no no  Got Tachycardic >/= 90/min yes Yes even at rest  Symptoms at end of test Moderate dyspnea, lightheaded moderate  Miscellaneous comments  stable   Results for AUNDRA, ESPIN (MRN [id number]) as of 05/30/2019 11:49  Ref. Range 05/30/2019 10:38  FVC-Pre Latest Units: L 3.30  FVC-%Pred-Pre Latest Units: % 64  FEV1-Pre Latest Units: L 2.55  FEV1-%Pred-Pre Latest Units: % 64  Pre FEV1/FVC ratio Latest Units: % 77  Results for MARTEN, ILES (MRN [id number]) as of 05/30/2019 11:49  Ref. Range 05/30/2019 10:38  DLCO unc Latest Units: ml/min/mmHg 12.85  DLCO unc % pred Latest Units: % 43      IMPRESSION:COMPARISON:  CT chest, 05/15/2018, 01/24/2018, 12/23/2015 1. There is redemonstrated moderate pulmonary fibrosis in a bibasilar predominant pattern featuring extensive peripheral bronchiolectasis, mild bronchiectasis, and areas of honeycombing, particularly in  the right lung base. Fibrotic findings are significantly worsened over time in comparison to prior CTs dating back to 12/23/2015, with increased bronchiolectasis.  There is likely underlying paraseptal emphysema. Findings are generally consistent with UIP pattern pulmonary fibrosis by ATS pulmonary fibrosis criteria.  2. There are multiple bilateral pulmonary nodules and areas of consolidative opacity which are unchanged compared to prior examination dating back to 12/23/2015 and benign, for example a 7 mm nodule of the right upper lobe (series 10, image 131).  3.  Coronary artery disease.   Electronically Signed   By: Eddie Candle M.D.   On: 10/17/2018 09:11  ROS - per HPI     has a past medical history of ALS (amyotrophic lateral sclerosis) ([place]), Back pain, Cancer ([place]), COPD (chronic obstructive pulmonary disease) ([place]), Coronary artery disease, DDD (degenerative disc disease), lumbar, Depression, Dysphagia, History of blood transfusion (2014), Migraine, Pulmonary fibrosis ([place]), Spider bite (2012), and Stroke [facility]).   reports that he quit smoking about 7 years ago. His smoking use included cigarettes. He has a 30.00 pack-year smoking history. He has never used smokeless tobacco.  Past Surgical History:  Procedure Laterality Date   ANTERIOR CERVICAL DECOMP/DISCECTOMY FUSION  12/22/2011   Procedure: ANTERIOR CERVICAL DECOMPRESSION/DISCECTOMY FUSION 3 LEVELS;  Surgeon: Melina Schools, MD;  Location: [facility];  Service: Orthopedics;  Laterality: N/A;  ACDF C5-T1   ANTERIOR CERVICAL DECOMP/DISCECTOMY FUSION  06/25/2012   Procedure: ANTERIOR CERVICAL DECOMPRESSION/DISCECTOMY FUSION 1 LEVEL/HARDWARE REMOVAL;  Surgeon: [name], MD;  Location: [facility];  Service: Orthopedics;  Laterality: N/A;  Removal anterior cervical plate C5-T1, Explore Fusion, Left C5-6, C6-7, C7-T1 Re-do Foraminotomy, Left open carpal tunnel release with release of ulnar nerve at [facility], Posterior Cervical Fusion with lateral mass screw   BACK SURGERY     CARDIAC CATHETERIZATION     CARPAL TUNNEL RELEASE  06/25/2012   Procedure: CARPAL TUNNEL RELEASE;   Surgeon: [name], MD;  Location: [facility];  Service: Orthopedics;  Laterality: Left;  as above   CHOLECYSTECTOMY  2009   CORONARY ANGIOPLASTY  2001   [facility]   LUMBAR FUSION  2000   POSTERIOR CERVICAL FUSION/FORAMINOTOMY  06/25/2012   Procedure: POSTERIOR CERVICAL FUSION/FORAMINOTOMY LEVEL 1;  Surgeon: [name], MD;  Location: [facility];  Service: Orthopedics;  Laterality: N/A;  as above   POSTERIOR CERVICAL FUSION/FORAMINOTOMY N/A 12/17/2013   Procedure: REMOVAL OF POSTERIOR CERVICAL FUSION LATERAL MASS SCREWS AND RODS C5-T1, EXPLORE FUSION, LEFT SIX-SEVEN FORAMINOTOMY;  Surgeon: [name], MD;  Location: [facility];  Service: Orthopedics;  Laterality: N/A;   vasectomy  1990    No Known Allergies  Immunization History  Administered Date(s) Administered   Influenza Split 06/26/2012   Influenza,inj,Quad PF,6+ Mos 08/24/2015, 08/25/2016, 04/30/2018   Influenza-Unspecified 06/26/2012, 08/24/2015, 08/25/2016, 05/21/2019   Tdap 06/01/2013    Family History  Problem Relation Age of Onset   ALS Mother    Cancer Mother    Cancer Sister    ALS Sister    Cancer Brother        1 brother   Anesthesia problems Neg Hx      Current Outpatient Medications:    albuterol (PROVENTIL HFA;VENTOLIN HFA) 108 (90 Base) MCG/ACT inhaler, Inhale 2 puffs into the lungs every 6 (six) hours as needed for wheezing or shortness of breath., Disp: 1 Inhaler, Rfl: 5   albuterol (PROVENTIL) (2.5 MG/3ML) 0.083% nebulizer solution, Take 3 mLs (2.5 mg total) by nebulization every 6 (six) hours  as needed for wheezing or shortness of breath., Disp: 150 mL, Rfl: 1   baclofen (LIORESAL) 10 MG tablet, Take 1 tablet (10 mg total) by mouth 3 (three) times daily., Disp: 30 each, Rfl: 0   budesonide-formoterol (SYMBICORT) 160-4.5 MCG/ACT inhaler, Inhale 2 puffs into the lungs 2 (two) times daily., Disp: 1 Inhaler, Rfl: 6   budesonide-formoterol (SYMBICORT) 160-4.5 MCG/ACT inhaler, Inhale 2 puffs  into the lungs 2 (two) times daily., Disp: 1 Inhaler, Rfl: 3   methocarbamol (ROBAXIN) 750 MG tablet, Take 1 tablet (750 mg total) by mouth 2 (two) times daily as needed., Disp: 180 tablet, Rfl: 1   metoprolol succinate (TOPROL-XL) 50 MG 24 hr tablet, Take 1 tablet (50 mg total) by mouth daily., Disp: 90 tablet, Rfl: 1   Nutritional Supplements (ENSURE HIGH PROTEIN) LIQD, Take 237 mLs by mouth 3 (three) times daily., Disp: 90 Can, Rfl: 5   oxyCODONE (ROXICODONE) 15 MG immediate release tablet, Take 15 mg by mouth 4 (four) times daily., Disp: , Rfl: 0   sildenafil (REVATIO) 20 MG tablet, Take 2-5 tabs PRN prior to sexual activity., Disp: , Rfl:    tiotropium (SPIRIVA) 18 MCG inhalation capsule, Place 1 capsule (18 mcg total) into inhaler and inhale daily., Disp: 30 capsule, Rfl: 6   Tiotropium Bromide Monohydrate (SPIRIVA RESPIMAT) 2.5 MCG/ACT AERS, Inhale 2 puffs into the lungs daily., Disp: 1 Inhaler, Rfl: 0   Tiotropium Bromide Monohydrate (SPIRIVA RESPIMAT) 2.5 MCG/ACT AERS, Inhale 2 puffs into the lungs daily., Disp: 1 Inhaler, Rfl: 3   topiramate (TOPAMAX) 50 MG tablet, Take 2 tabs BID, Disp: , Rfl:       Objective:   Vitals:   05/30/19 1135  BP: 118/76  Pulse: 95  Temp: 98.1 F (36.7 C)  TempSrc: Temporal  SpO2: 98%  Weight: 138 lb (62.6 kg)  Height: 5' 11.5" (1.816 m)    Estimated body mass index is 18.98 kg/m as calculated from the following:   Height as of this encounter: 5' 11.5" (1.816 m).   Weight as of this encounter: 138 lb (62.6 kg).  @WEIGHTCHANGE @  [employer]   05/30/19 1135  Weight: 138 lb (62.6 kg)     Physical Exam  General Appearance:    Alert, cooperative, no distress, appears stated age - yes , Deconditioned looking - no , OBESE  - no, Sitting on Wheelchair -  no  Head:    Normocephalic, without obvious abnormality, atraumatic  Eyes:    PERRL, conjunctiva/corneas clear,  Ears:    Normal TM's and external ear canals, both ears  Nose:    Nares normal, septum midline, mucosa normal, no drainage    or sinus tenderness. OXYGEN ON  - no . Patient is @ ra   Throat:   Lips, mucosa, and tongue normal; teeth and gums normal. Cyanosis on lips - no  Neck:   Supple, symmetrical, trachea midline, no adenopathy;    thyroid:  no enlargement/tenderness/nodules; no carotid   bruit or JVD  Back:     Symmetric, no curvature, ROM normal, no CVA tenderness  Lungs:     Distress - no , Wheeze no, Barrell Chest - no, Purse lip breathing - no, Crackles - yes faint at base   Chest Wall:    No tenderness or deformity.    Heart:    Regular rate and rhythm, S1 and S2 normal, no rub   or gallop, Murmur - n  Breast Exam:    NOT DONE  Abdomen:  Soft, non-tender, bowel sounds active all four quadrants,    no masses, no organomegaly. Visceral obesity - no  Genitalia:   NOT DONE  Rectal:   NOT DONE  Extremities:   Extremities - normal, Has Cane - no, Clubbing - YES, Edema - no  Pulses:   2+ and symmetric all extremities  Skin:   Stigmata of Connective Tissue Disease - no  Lymph nodes:   Cervical, supraclavicular, and axillary nodes normal  Psychiatric:  Neurologic:   Pleasant - yes, Anxious - yes, Flat affect - no  CAm-ICU - neg, Alert and Oriented x 3 - yes, Moves all 4s - yes, Speech - normal, Cognition - intact           Assessment:       ICD-10-CM   1. IPF (idiopathic pulmonary fibrosis) ([place])  J84.112   2. 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 Pulmonary emphysema, unspecified emphysema type ([place])  J43.9   4. Cough  R05        Plan:     Patient Instructions     ICD-10-CM   1. IPF (idiopathic pulmonary fibrosis) ([place])  J84.112   2. 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 Pulmonary emphysema, unspecified emphysema type ([place])  J43.9   4. Cough  R05      You have IPF - diagnosis given 08/23/2018 (your mom has same) This disease as discussed is progressive in nature   - just like ALS has  unpredictable course  - can be faster than ALS  - there is evidence the disease is progressive  You also have emphysema  You have lot of symptoms of cough and shortness of breath due to combination of ALS, emphysema and IPF  - O2 helping due to o2 supply and soothing effect  Too bad esbriet did not get started right -  Explained the copay process and charity process  Future is participating in rehab, trials and support group to get most advantage in the fight against disease  Plan - check LFT 05/30/2019 befor starting esbriet  - restart esbriet per protocol  - test ONO on room air at home and 6 min walk test at next vist   - if abnromal will start o2 and see if this helps cough before we move onto pills etc  - refer genetic counselor Roma Kayser  - at some point will discuss research protocols and patient support group  Followup  - 6 weeks do 6 min walk test with [place]  - 6 week return to ILD clinic; symptoms score at followup     > 50% of this > 25 min visit spent in face to face counseling or coordination of care - by this undersigned MD - Dr Brand Males. This includes one or more of the following documented above: discussion of test results, diagnostic or treatment recommendations, prognosis, risks and benefits of management options, instructions, education, compliance or risk-factor reduction   SIGNATURE    Dr. Brand Males, M.D., F.C.C.P,  Pulmonary and Critical Care Medicine Staff Physician, [place] Director - Interstitial Lung Disease  Program  Pulmonary [place] at [place], [place], [address]  Pager: [phone], If no answer or between  15:00h - 7:00h: call [phone] Telephone: [phone]  12:22 PM 05/30/2019

## 2019-05-30 NOTE — Progress Notes (Signed)
Spirometry and Dlco done today. 

## 2019-05-30 NOTE — Patient Instructions (Addendum)
ICD-10-CM   1. IPF (idiopathic pulmonary fibrosis) ([employer])  J84.112   2. 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 Pulmonary emphysema, unspecified emphysema type ([place])  J43.9   4. Cough  R05      You have IPF - diagnosis given 08/23/2018 (your mom has same) This disease as discussed is progressive in nature   - just like ALS has unpredictable course  - can be faster than ALS  - there is evidence the disease is progressive  You also have emphysema  You have lot of symptoms of cough and shortness of breath due to combination of ALS, emphysema and IPF  - O2 helping due to o2 supply and soothing effect  Too bad esbriet did not get started right -  Explained the copay process and charity process  Plan - check LFT 05/30/2019 befor starting esbriet  - restart esbriet per protocol  - test ONO on room air at home and 6 min walk test at next vist   - if abnromal will start o2 and see if this helps cough before we move onto pills etc  - refer genetic counselor Roma Kayser  - at some point will discuss research protocols and patient support group  Followup  - 6 weeks do 6 min walk test with [place]  - 6 week return to ILD clinic; symptoms score at followup

## 2019-06-05 ENCOUNTER — Telehealth: Payer: Self-pay | Admitting: Pharmacy Technician

## 2019-06-05 DIAGNOSIS — J84112 Idiopathic pulmonary fibrosis: Secondary | ICD-10-CM

## 2019-06-05 NOTE — Telephone Encounter (Signed)
Received [place] for patient. Will update as we work through the benefits process.  9:23 AM Beatriz Chancellor, CPhT

## 2019-06-07 DIAGNOSIS — Z79899 Other long term (current) drug therapy: Secondary | ICD-10-CM | POA: Diagnosis not present

## 2019-06-07 DIAGNOSIS — M5136 Other intervertebral disc degeneration, lumbar region: Secondary | ICD-10-CM | POA: Diagnosis not present

## 2019-06-07 DIAGNOSIS — G894 Chronic pain syndrome: Secondary | ICD-10-CM | POA: Diagnosis not present

## 2019-06-07 DIAGNOSIS — R05 Cough: Secondary | ICD-10-CM | POA: Diagnosis not present

## 2019-06-10 MED ORDER — ESBRIET 267 MG PO TABS: ORAL_TABLET | ORAL | 0 refills | Status: AC

## 2019-06-11 NOTE — Telephone Encounter (Signed)
Check [employer] website and confirmed Esbriet does not require a prior authorization. Processes with a $3.00 copay. Pharmacist will call patient to counsel and we will set up 1st shipment from [facility].  11:45 AM Beatriz Chancellor, CPhT

## 2019-06-13 NOTE — Telephone Encounter (Signed)
Patient's 1st Esbriet shipment from [facility] is scheduled to deliver on 06/18/19.  2:13 PM Beatriz Chancellor, CPhT

## 2019-06-17 MED FILL — ESBRIET 267 MG TABS: 267 | 30 days supply | Qty: 207 | Fill #0

## 2019-06-19 DIAGNOSIS — R634 Abnormal weight loss: Secondary | ICD-10-CM | POA: Diagnosis not present

## 2019-06-25 ENCOUNTER — Ambulatory Visit (INDEPENDENT_AMBULATORY_CARE_PROVIDER_SITE_OTHER): Payer: Medicaid Other

## 2019-06-25 ENCOUNTER — Other Ambulatory Visit: Payer: Self-pay

## 2019-06-25 ENCOUNTER — Ambulatory Visit (INDEPENDENT_AMBULATORY_CARE_PROVIDER_SITE_OTHER): Payer: Medicaid Other | Admitting: Internal Medicine

## 2019-06-25 ENCOUNTER — Encounter: Payer: Self-pay | Admitting: Internal Medicine

## 2019-06-25 VITALS — BP 114/62 | HR 89 | Ht 72.0 in | Wt 144.0 lb

## 2019-06-25 DIAGNOSIS — Z79899 Other long term (current) drug therapy: Secondary | ICD-10-CM | POA: Diagnosis not present

## 2019-06-25 DIAGNOSIS — J84112 Idiopathic pulmonary fibrosis: Secondary | ICD-10-CM

## 2019-06-25 DIAGNOSIS — I2510 Atherosclerotic heart disease of native coronary artery without angina pectoris: Secondary | ICD-10-CM

## 2019-06-25 DIAGNOSIS — Z23 Encounter for immunization: Secondary | ICD-10-CM | POA: Diagnosis not present

## 2019-06-25 DIAGNOSIS — R112 Nausea with vomiting, unspecified: Secondary | ICD-10-CM | POA: Diagnosis not present

## 2019-06-25 DIAGNOSIS — J439 Emphysema, unspecified: Secondary | ICD-10-CM | POA: Diagnosis not present

## 2019-06-25 LAB — HEPATIC FUNCTION PANEL
ALT: 7 U/L (ref 0–53)
AST: 11 U/L (ref 0–37)
Albumin: 4 g/dL (ref 3.5–5.2)
Alkaline Phosphatase: 76 U/L (ref 39–117)
Bilirubin, Direct: 0.1 mg/dL (ref 0.0–0.3)
Total Bilirubin: 0.4 mg/dL (ref 0.2–1.2)
Total Protein: 8.5 g/dL — ABNORMAL HIGH (ref 6.0–8.3)

## 2019-06-25 MED ORDER — ONDANSETRON HCL 4 MG PO TABS: 4.0000 mg | ORAL_TABLET | Freq: Three times a day (TID) | ORAL | 3 refills | Status: DC

## 2019-06-25 NOTE — Progress Notes (Signed)
OV Nov 2017 History of Present Illness Bradley Rice is a 58 y.o. male for evaluation of sleep problems.  He has noticed trouble falling asleep and staying asleep.  He feels some of this is related to neck and back pain.  His wife has told him that he snores, and will stop breathing while asleep.  He has a hard time slowing things down when he is trying to sleep.  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  He reports not being seen by a neurologist for past 2 years.  He also reports history of non small cell lung cancer from about 2 years ago.  He was told the ALS therapy and cancer therapy couldn't be given together, so he stopped both therapies.  He reports seeing a lung doctor about 2 years ago, but was only told that he had lung cancer but no other lung disease that he is aware of.  He had all these appointments in [place], and he thinks these were at [facility].  He goes to sleep at 10 pm.  He falls asleep after 2 hours.  He wakes up 3 or 4 times, and then has trouble falling back to sleep.  He gets out of bed at 7 am.  He feels tired in the morning.  He denies morning headache.  He does not use anything to help him fall sleep or stay awake.  He denies sleep walking, sleep talking, bruxism, or nightmares.  There is no history of restless legs.  He denies sleep hallucinations, sleep paralysis, or cataplexy.  The Epworth score is 9 out of 24.   07/06/2018  Patient presents today to follow-up on pulmonary nodules. He was seen back in 2017 by Dr. Halford Chessman and ordered for ONO, full pulmonary function testing, labs and serology. Former smoker, smoked 1ppd from the age [age]. Reports quitting in 2012.   Complains of coughing fits with clear mucus x 62months. He experiences shortness of breath when coughing. States that he coughs so much at night that he throws up and reports that he stops breathing. Rescue inhaler helps  some. Unsure if he is using Symbicort twice daily as prescribed. Stopped breo.   CT Chest 05/15/18- Re-demonstration bilateral lung nodules, no new or enlarging nodules since June 2019. 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 Recommend follow up 6-12 months. Follow CT out for 2 years. Refer to ILD clinic. Refer to lung cancer screening.   Exposure history: Work- [employer], he was around fumes and gas without a mask (on disability for neck/back pain) NO birds, cats/dogs Has carpet No hot tub No beach house  Spirometry 07/06/2018 FVC 3.1 (59%), FEV1 2.7 (67%), ratio  87 (unable to complete)  Pulmonary tests CT angio chest 12/23/15 >> mild paraseptal emphysema, progressive fibrosis, several b/l nodules up to 8 mm     OV 08/23/2018  Subjective:  Patient ID: Bradley Rice, male , DOB: 1960/10/11 , age 42 y.o. , MRN: [id number] , ADDRESS: [place] [id number]   08/23/2018 -   Chief Complaint  Patient presents with   Follow-up    PFT not performed.  Pt states he is getting better and states he is no longer running a fever. Pt states that he does have complaints of SOB, cough with occ white phlegm, and CP.     HPI [name] 58  y.o. -presents to the ILD clinic as a second opinion referral from Dr. Ricarda Frame nurse practitioner.  He has a diagnosis of ALS and under follow-up at [facility].  He is still fairly functional and that he can do his activities of daily living.  He does not have any dysphagia.  He has now developed interstitial lung disease on top of his underlying emphysema which she is known to have.  Therefore has been referred here.  He tells me that he has had insidious worsening of shortness of breath for the last year or so.  He has difficulty doing activities of daily living because of dyspnea.  He does not believe his ALS is causing his dyspnea.  He also has significant severe chronic  cough but sometimes causes near presyncope.  He is only on Symbicort for his previous diagnosis of emphysema.  He was last seen in our practice in 2017 but after that reestablished in the last few months during which time the ILD was discovered and he is now been referred to the ILD clinic.  He reports having answered all the questions on interstitial lung disease questionnaire but this questionnaire is now missing with his answers.  Therefore he had to redo. Does not want to do it 08/23/18    CT scan of the chest January 24, 2018 in my personal visualization shows classic UIP findings.  This includes bilateral bibasal subpleural disease with honeycombing and traction bronchiectasis.  Further up in the lungs is emphysema with subpleural bulla as opposed to honeycombing.  These findings predate 2017.  Looking back he might have early ILD changes even in 2014 CT chest.    Results for QUAY, SIMKIN (MRN [id number]) as of 08/23/2018 09:45  Ref. Range 06/10/2016 14:48 07/06/2018 10:53  Anti Nuclear Antibody(ANA) Latest Ref Range: NEGATIVE  Negative NEGATIVE  Cyclic Citrullin Peptide Ab Latest Units: UNITS <16 <16  ds DNA Ab Latest Units: IU/mL  <1  Jo-1 Antibody, IgG Latest Ref Range: <1.0 NEG AI  <1.0 NEG   RA Latex Turbid. Latest Ref Range: <14 IU/mL <14 <14  SSA (Ro) (ENA) Antibody, IgG Latest Ref Range: <1.0 NEG AI <1.0 NEG <1.0 NEG  SSB (La) (ENA) Antibody, IgG Latest Ref Range: <1.0 NEG AI <1.0 NEG <1.0 NEG  Scleroderma (Scl-70) (ENA) Antibody, IgG Latest Ref Range: <1.0 NEG AI <1.0 NEG <1.0 NEG    OV 05/30/2019  Subjective:  Patient ID: Bradley Rice, male , DOB: 1960/10/17 , age 46 y.o. , MRN: [id number] , ADDRESS: [place] [address]   05/30/2019 -   Chief Complaint  Patient presents with   Follow-up    Patient reports that he still has sob with exertion and occ. cough.    Follow-up IPF [diagnosis given January 2020, clinical diagnosis, based on male gender, clubbing, family  history, smoking history, negative serology and associated emphysema] with associated emphysema in the setting of associated ALS.  Delays with Esbriet start in early 2020  HPI BERNELL SIGAL 58 y.o. -presents for follow-up.  Last seen in early 2020.  After that lost to follow-up because of the COVID-19 pandemic.  He tells me that since early this year he is significantly worse with shortness of breath and cough.  His symptom scores are listed below and is significant.  He says his cough is relieved only by using his mom's oxygen.  I find out that his mom also suffers from pulmonary fibrosis and sees somebody  at [facility].  He says the shortness of breath is also relieved by his mom's portable oxygen.  He is very frustrated by his symptoms.  He states his lung is full of crackles.  Apparently try to attend pulmonary rehabilitation earlier in 2020 had any pain but because of his ALS was deemed unsafe.  I am not able to find the documentation on this.  At last visit I started him on pirfenidone/Esbriet.  He tells me that he took a month supply of this.  He says he is frustrated by our office there was significant delays in getting him the [place] at proper pricing.  Apparently he had to pay a co-pay $1500 but later this got reduced to $500.  Nevertheless it was too expensive.  Currently is just on supportive care   He is not on any oxygen therapy.  He is using his mom's oxygen.  This is based on no evidence of desaturation.  However he tells me this is the main thing that helps his cough and shortness of breath.  He is willing to see go through overnight oxygen testing and see if he desaturates and if so start oxygen and see if his cough improves.  He says that cough medicines have not helped.  He was asking for stronger medication but after discussion he is willing to wait.  Discussed research concepts  Overall in terms of his ALS it is stable  He has had his flu shot.   He also has emphysema: He was  on an inhaled anticholinergic but he is run out and not taking.    IMPRESSION:COMPARISON:  CT chest, 05/15/2018, 01/24/2018, 12/23/2015 1. There is redemonstrated moderate pulmonary fibrosis in a bibasilar predominant pattern featuring extensive peripheral bronchiolectasis, mild bronchiectasis, and areas of honeycombing, particularly in the right lung base. Fibrotic findings are significantly worsened over time in comparison to prior CTs dating back to 12/23/2015, with increased bronchiolectasis. There is likely underlying paraseptal emphysema. Findings are generally consistent with UIP pattern pulmonary fibrosis by ATS pulmonary fibrosis criteria.  2. There are multiple bilateral pulmonary nodules and areas of consolidative opacity which are unchanged compared to prior examination dating back to 12/23/2015 and benign, for example a 7 mm nodule of the right upper lobe (series 10, image 131).  3.  Coronary artery disease.   Electronically Signed   By: Eddie Candle M.D.   On: 10/17/2018 09:11   OV 06/25/2019  Subjective:  Patient ID: Bradley Rice, male , DOB: 30-Dec-1960 , age 17 y.o. , MRN: [id number] , ADDRESS: [place] [address]   06/25/2019 -   Chief Complaint  Patient presents with   Follow-up    6MW attempted and pt stated he was not able to complete the entire walk due to breathing. Pt states he is becoming very SOB much easier and is having drops in O2 sats. Pt states he is also coughing.    Follow-up IPF [diagnosis given January 2020, clinical diagnosis, based on male gender, clubbing, family history, smoking history, negative serology and associated emphysema] with associated emphysema in the setting of associated ALS.  Delays with Esbriet start in early 2020. Restart esbriet 06/18/2019 or 06/19/2019  Known associated emphysema with Spiriva and Symbicort  Coronary artery calcification seen on CT scan March 2020.  HPI DEMONTREZ RINDFLEISCH 58 y.o. -presents  for the above issues for discussion only visit.  At this visit we are here to follow how he is tolerating pirfenidone.  He  is 1 week into pirfenidone at this point.  He is still on 1 pill 3 times daily.  He says he is spacing at least 6 7 hours between the pills.  He takes it with food.  He is trying different foods.  Despite this he is having mild to moderate amount of nausea.  He says this happened the previous time as well.  He says he tried some Dramamine yesterday but this did not help or helped somewhat.  He is willing to try other options.  We discussed Zofran and ginger capsules.  Also last time he did not desaturate but he is having significant amount of dyspnea.  Therefore we did a 6-minute walk test.  He did 7 laps and stop but he dropped only pulse ox of 2% from 100% to 98%.  He is out of proportion dyspnea.  He also tells me now that he has some occasional chest pains with exertion relieved by rest.  CT scan in March 2020 shows coronary artery calcification.  Otherwise he is stable.  SYMPTOM SCALE - ILD 05/30/2019  06/25/2019   O2 use ra   Shortness of Breath 0 -> 5 scale with 5 being worst (score 6 If unable to do)   At rest 4 5  Simple tasks - showers, clothes change, eating, shaving 4 3  Household (dishes, doing bed, laundry) 4 3  Shopping 5 5  Walking level at own pace 5 4  Walking keeping up with others of same age 69 4  Walking up Stairs 5 5  Walking up Hill 5 5  Total (40 - 48) Dyspnea Score 37 34  How bad is your cough? 5 4  How bad is your fatigue 4 4         Simple office walk 185 feet x  3 laps goal with forehead probe 08/23/2018  05/30/2019  06/25/2019   O2 used Room air Room air RA  Number laps completed 3 3 6wmd  Comments about pace Slow pace* steady 7 laps  Resting Pulse Ox/HR 99% and 93/min 98% and HR 96/min   Final Pulse Ox/HR 98% and 110/min 98% and HR 105/mn   Desaturated </= 88% no no   Desaturated <= 3% points no no   Got Tachycardic >/= 90/min yes  Yes even at rest   Symptoms at end of test Moderate dyspnea, lightheaded moderate Did ntt drop pulse ox . sTiooed due to dyspnea  Miscellaneous comments  stable    Results for CORDAE, MCCAREY (MRN [id number]) as of 05/30/2019 11:49  Ref. Range 05/30/2019 10:38  FVC-Pre Latest Units: L 3.30  FVC-%Pred-Pre Latest Units: % 64  FEV1-Pre Latest Units: L 2.55  FEV1-%Pred-Pre Latest Units: % 64  Pre FEV1/FVC ratio Latest Units: % 77  Results for ETHIN, DRUMMOND (MRN [id number]) as of 05/30/2019 11:49  Ref. Range 05/30/2019 10:38  DLCO unc Latest Units: ml/min/mmHg 12.85  DLCO unc % pred Latest Units: % 43     ROS - per HPI     has a past medical history of ALS (amyotrophic lateral sclerosis) ([place]), Back pain, Cancer ([place]), COPD (chronic obstructive pulmonary disease) ([place]), Coronary artery disease, DDD (degenerative disc disease), lumbar, Depression, Dysphagia, History of blood transfusion (2014), Migraine, Pulmonary fibrosis ([place]), Spider bite (2012), and Stroke [facility]).   reports that he quit smoking about 7 years ago. His smoking use included cigarettes. He has a 30.00 pack-year smoking history. He has never used smokeless tobacco.  Past Surgical History:  Procedure Laterality Date   ANTERIOR CERVICAL DECOMP/DISCECTOMY FUSION  12/22/2011   Procedure: ANTERIOR CERVICAL DECOMPRESSION/DISCECTOMY FUSION 3 LEVELS;  Surgeon: Melina Schools, MD;  Location: [place];  Service: Orthopedics;  Laterality: N/A;  ACDF C5-T1   ANTERIOR CERVICAL DECOMP/DISCECTOMY FUSION  06/25/2012   Procedure: ANTERIOR CERVICAL DECOMPRESSION/DISCECTOMY FUSION 1 LEVEL/HARDWARE REMOVAL;  Surgeon: [name], MD;  Location: [place];  Service: Orthopedics;  Laterality: N/A;  Removal anterior cervical plate C5-T1, Explore Fusion, Left C5-6, C6-7, C7-T1 Re-do Foraminotomy, Left open carpal tunnel release with release of ulnar nerve at [facility], Posterior Cervical Fusion with lateral mass screw   BACK SURGERY     CARDIAC  CATHETERIZATION     CARPAL TUNNEL RELEASE  06/25/2012   Procedure: CARPAL TUNNEL RELEASE;  Surgeon: [name], MD;  Location: [place];  Service: Orthopedics;  Laterality: Left;  as above   CHOLECYSTECTOMY  2009   CORONARY ANGIOPLASTY  2001   [facility]   LUMBAR FUSION  2000   POSTERIOR CERVICAL FUSION/FORAMINOTOMY  06/25/2012   Procedure: POSTERIOR CERVICAL FUSION/FORAMINOTOMY LEVEL 1;  Surgeon: [name], MD;  Location: [place];  Service: Orthopedics;  Laterality: N/A;  as above   POSTERIOR CERVICAL FUSION/FORAMINOTOMY N/A 12/17/2013   Procedure: REMOVAL OF POSTERIOR CERVICAL FUSION LATERAL MASS SCREWS AND RODS C5-T1, EXPLORE FUSION, LEFT SIX-SEVEN FORAMINOTOMY;  Surgeon: [name], MD;  Location: [place];  Service: Orthopedics;  Laterality: N/A;   vasectomy  1990    No Known Allergies  Immunization History  Administered Date(s) Administered   Influenza Split 06/26/2012   Influenza,inj,Quad PF,6+ Mos 08/24/2015, 08/25/2016, 04/30/2018   Influenza-Unspecified 06/26/2012, 08/24/2015, 08/25/2016, 05/21/2019   Tdap 06/01/2013    Family History  Problem Relation Age of Onset   ALS Mother    Cancer Mother    Cancer Sister    ALS Sister    Cancer Brother        1 brother   Anesthesia problems Neg Hx      Current Outpatient Medications:    albuterol (PROVENTIL HFA;VENTOLIN HFA) 108 (90 Base) MCG/ACT inhaler, Inhale 2 puffs into the lungs every 6 (six) hours as needed for wheezing or shortness of breath., Disp: 1 Inhaler, Rfl: 5   albuterol (PROVENTIL) (2.5 MG/3ML) 0.083% nebulizer solution, Take 3 mLs (2.5 mg total) by nebulization every 6 (six) hours as needed for wheezing or shortness of breath., Disp: 150 mL, Rfl: 1   baclofen (LIORESAL) 10 MG tablet, Take 1 tablet (10 mg total) by mouth 3 (three) times daily., Disp: 30 each, Rfl: 0   budesonide-formoterol (SYMBICORT) 160-4.5 MCG/ACT inhaler, Inhale 2 puffs into the lungs 2 (two) times daily., Disp: 1  Inhaler, Rfl: 3   methocarbamol (ROBAXIN) 750 MG tablet, Take 1 tablet (750 mg total) by mouth 2 (two) times daily as needed., Disp: 180 tablet, Rfl: 1   metoprolol succinate (TOPROL-XL) 50 MG 24 hr tablet, Take 1 tablet (50 mg total) by mouth daily., Disp: 90 tablet, Rfl: 1   Nutritional Supplements (ENSURE HIGH PROTEIN) LIQD, Take 237 mLs by mouth 3 (three) times daily., Disp: 90 Can, Rfl: 5   oxyCODONE (ROXICODONE) 15 MG immediate release tablet, Take 15 mg by mouth 4 (four) times daily., Disp: , Rfl: 0   Pirfenidone (ESBRIET) 267 MG TABS, Take 1 tablet (267 mg total) by mouth 3 (three) times daily for 7 days, THEN 2 tablets (534 mg total) 3 (three) times daily for 7 days, THEN 3 tablets (801 mg total) 3 (  three) times daily for 16 days., Disp: 207 tablet, Rfl: 0   sildenafil (REVATIO) 20 MG tablet, Take 2-5 tabs PRN prior to sexual activity., Disp: , Rfl:    tiotropium (SPIRIVA) 18 MCG inhalation capsule, Place 1 capsule (18 mcg total) into inhaler and inhale daily., Disp: 30 capsule, Rfl: 6   topiramate (TOPAMAX) 50 MG tablet, Take 2 tabs BID, Disp: , Rfl:       Objective:   Vitals:   06/25/19 1029  BP: 114/62  Pulse: 89  SpO2: 98%  Weight: 144 lb (65.3 kg)  Height: 6' (1.829 m)    Estimated body mass index is 19.53 kg/m as calculated from the following:   Height as of this encounter: 6' (1.829 m).   Weight as of this encounter: 144 lb (65.3 kg).  @WEIGHTCHANGE @  [employer]   06/25/19 1029  Weight: 144 lb (65.3 kg)     Physical Exam Discussion only visit cough        Assessment:       ICD-10-CM   1. IPF (idiopathic pulmonary fibrosis) ([place])  J84.112   2. 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 Pulmonary emphysema, unspecified emphysema type ([place])  J43.9   4. Drug-induced nausea and vomiting  R11.2    T50.905A   5. Encounter for drug therapy  Z79.899   6. Coronary artery calcification seen on CAT scan  I25.10        Plan:       Patient Instructions     ICD-10-CM   1. IPF (idiopathic pulmonary fibrosis) ([place])  J84.112   2. 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 Pulmonary emphysema, unspecified emphysema type ([place])  J43.9   4. Drug-induced nausea and vomiting  R11.2    T50.905A   5. Encounter for drug therapy  Z79.899   6. Coronary artery calcification seen on CAT scan  I25.10      IPF (idiopathic pulmonary fibrosis) (HCC) Familial idiopathic pulmonary fibrosis (HCC)  Drug-induced nausea and vomiting -- new due to esbriet Encounter for drug therapy  He having mild to moderate nausea with pirfenidone No evidence of need for oxygen. Significant shortness of breath is due to combination of IPF and ALS  Plan  -Continue adequate spacing as you are and take the pirfenidone with food -Continue 1 pill 3 times daily for another week for a total of 2 weeks before you escalate -Then take 2 pills 3 times a day for another 2 weeks before you escalate to maximum dose of 3 pills 3 times daily -Try over-the-counter ginger capsules for nausea.  If this does not work then -Try Zofran 4 mg 3 times daily as needed for nausea [take prescription for 30-day with 3 refills] -Check liver function test today -Take pneumonia vaccine Pneumovax today -Referral Roma Kayser genetics counselor [not sure why I still do not have an appointment]   Pulmonary emphysema, unspecified emphysema type ([place])  Stable -Continue Spiriva and Symbicort daily with albuterol as needed  Coronary artery calcification seen on CAT scan March 2020  -Could be contributing to shortness of breath -Refer to cardiology  Plan -Return to see me or nurse practitioner in 4 weeks for 30-minute slot to continue with management of drug side effects  > 50% of this > 25 min visit spent in  face to face counseling or coordination of care - by this undersigned MD - Dr Brand Males. This includes one or more of the following documented  above: discussion of test results, diagnostic or treatment recommendations, prognosis, risks and benefits of management options, instructions, education, compliance or risk-factor reduction    SIGNATURE    Dr. Brand Males, M.D., F.C.C.P,  Pulmonary and Critical Care Medicine Staff Physician, [place] Director - Interstitial Lung Disease  Program  Pulmonary [place] at [place], [place], [address]  Pager: [phone], If no answer or between  15:00h - 7:00h: call [phone] Telephone: [phone]  11:20 AM 06/25/2019

## 2019-06-25 NOTE — Addendum Note (Signed)
Addended by: Lorretta Harp on: 06/25/2019 12:12 PM   Modules accepted: Orders

## 2019-06-25 NOTE — Patient Instructions (Addendum)
ICD-10-CM   1. IPF (idiopathic pulmonary fibrosis) ([place])  J84.112   2. 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 Pulmonary emphysema, unspecified emphysema type ([place])  J43.9   4. Drug-induced nausea and vomiting  R11.2    T50.905A   5. Encounter for drug therapy  Z79.899   6. Coronary artery calcification seen on CAT scan  I25.10      IPF (idiopathic pulmonary fibrosis) (HCC) Familial idiopathic pulmonary fibrosis (HCC)  Drug-induced nausea and vomiting -- new due to esbriet Encounter for drug therapy  He having mild to moderate nausea with pirfenidone No evidence of need for oxygen. Significant shortness of breath is due to combination of IPF and ALS  Plan  -Continue adequate spacing as you are and take the pirfenidone with food -Continue 1 pill 3 times daily for another week for a total of 2 weeks before you escalate -Then take 2 pills 3 times a day for another 2 weeks before you escalate to maximum dose of 3 pills 3 times daily -Try over-the-counter ginger capsules for nausea.  If this does not work then -Try Zofran 4 mg 3 times daily as needed for nausea [take prescription for 30-day with 3 refills] -Check liver function test today -Take pneumonia vaccine Pneumovax today -Referral Roma Kayser genetics counselor [not sure why I still do not have an appointment]   Pulmonary emphysema, unspecified emphysema type ([place])  Stable -Continue Spiriva and Symbicort daily with albuterol as needed  Coronary artery calcification seen on CAT scan March 2020  -Could be contributing to shortness of breath -Refer to cardiology  Plan -Return to see me or nurse practitioner in 4 weeks for 30-minute slot to continue with management of drug side effects

## 2019-06-25 NOTE — Addendum Note (Signed)
Addended by: Raliegh Ip on: 06/25/2019 11:36 AM   Modules accepted: Orders

## 2019-06-25 NOTE — Progress Notes (Signed)
SIX MIN WALK 06/25/2019 05/30/2019 08/23/2018  Medications Oxycodone 15mg  taken 0430 - -  Supplimental Oxygen during Test? (L/min) No No No  Laps 7 - -  Partial Lap (in Meters) 0 - -  Baseline BP (sitting) 104/70 - -  Baseline Heartrate 94 - -  Baseline Dyspnea ([place] Scale) 1 - -  Baseline Fatigue ([place] Scale) 2 - -  Baseline SPO2 100 - -  BP (sitting) 132/72 - -  Heartrate 103 - -  Dyspnea ([place] Scale) 7 - -  Fatigue ([place] Scale) 4 - -  SPO2 98 - -  BP (sitting) 118/74 - -  Heartrate 101 - -  SPO2 100 - -  Stopped or Paused before Six Minutes Yes - -  Other Symptoms at end of Exercise Pt wanted to stop walk due to his  SOB. Pt denied any other s/s. - -  Distance Completed 238 - -  Tech Comments: - Pt. walked at steady pace with moderate sob. ER Pt walked at a slow pace completing all required laps having complaints of moderate SOB and lightheaded.

## 2019-07-01 DIAGNOSIS — J84112 Idiopathic pulmonary fibrosis: Secondary | ICD-10-CM | POA: Diagnosis not present

## 2019-07-01 DIAGNOSIS — J439 Emphysema, unspecified: Secondary | ICD-10-CM | POA: Diagnosis not present

## 2019-07-02 DIAGNOSIS — J84112 Idiopathic pulmonary fibrosis: Secondary | ICD-10-CM | POA: Diagnosis not present

## 2019-07-02 DIAGNOSIS — J439 Emphysema, unspecified: Secondary | ICD-10-CM | POA: Diagnosis not present

## 2019-07-04 DIAGNOSIS — G894 Chronic pain syndrome: Secondary | ICD-10-CM | POA: Diagnosis not present

## 2019-07-04 DIAGNOSIS — M5136 Other intervertebral disc degeneration, lumbar region: Secondary | ICD-10-CM | POA: Diagnosis not present

## 2019-07-04 DIAGNOSIS — Z79899 Other long term (current) drug therapy: Secondary | ICD-10-CM | POA: Diagnosis not present

## 2019-07-19 ENCOUNTER — Telehealth: Payer: Self-pay | Admitting: Pharmacist

## 2019-07-19 DIAGNOSIS — J84112 Idiopathic pulmonary fibrosis: Secondary | ICD-10-CM

## 2019-07-19 MED ORDER — ESBRIET 801 MG PO TABS: 1.0000 | ORAL_TABLET | Freq: Three times a day (TID) | ORAL | 2 refills | Status: DC

## 2019-07-19 NOTE — Telephone Encounter (Signed)
Patient is due for refill for Esbriet. He is tolerating 3 tablets three times a day.  Sending in script for maintenance dose of 801 mg tablet three times day with meals.   Mariella Saa, PharmD, [place], [place] Clinical Specialty Pharmacist [phone]  07/19/2019 4:12 PM

## 2019-07-23 ENCOUNTER — Ambulatory Visit: Payer: Medicaid Other | Admitting: Cardiovascular Disease

## 2019-07-29 ENCOUNTER — Ambulatory Visit: Payer: Medicaid Other | Admitting: Internal Medicine

## 2019-07-30 DIAGNOSIS — R634 Abnormal weight loss: Secondary | ICD-10-CM | POA: Diagnosis not present

## 2019-08-02 DIAGNOSIS — J439 Emphysema, unspecified: Secondary | ICD-10-CM | POA: Diagnosis not present

## 2019-08-02 DIAGNOSIS — J84112 Idiopathic pulmonary fibrosis: Secondary | ICD-10-CM | POA: Diagnosis not present

## 2019-08-06 DIAGNOSIS — M5136 Other intervertebral disc degeneration, lumbar region: Secondary | ICD-10-CM | POA: Diagnosis not present

## 2019-08-06 DIAGNOSIS — G894 Chronic pain syndrome: Secondary | ICD-10-CM | POA: Diagnosis not present

## 2019-08-06 DIAGNOSIS — Z79899 Other long term (current) drug therapy: Secondary | ICD-10-CM | POA: Diagnosis not present

## 2019-08-15 DIAGNOSIS — Z9189 Other specified personal risk factors, not elsewhere classified: Secondary | ICD-10-CM | POA: Diagnosis not present

## 2019-08-15 DIAGNOSIS — R509 Fever, unspecified: Secondary | ICD-10-CM | POA: Diagnosis not present

## 2019-08-15 DIAGNOSIS — Z20822 Contact with and (suspected) exposure to covid-19: Secondary | ICD-10-CM | POA: Diagnosis not present

## 2019-08-16 ENCOUNTER — Ambulatory Visit: Payer: Medicaid Other | Admitting: Cardiovascular Disease

## 2019-08-16 NOTE — Telephone Encounter (Signed)
Pharmacy has been unsuccessful in reaching patient to schedule Esbriet shipment- made 3 attempts. Attempted to call patient, left voicemail.  10:29 AM Beatriz Chancellor, CPhT

## 2019-08-23 ENCOUNTER — Telehealth: Payer: Self-pay | Admitting: Cardiology

## 2019-08-23 ENCOUNTER — Encounter: Payer: Self-pay | Admitting: Cardiology

## 2019-08-23 ENCOUNTER — Ambulatory Visit (INDEPENDENT_AMBULATORY_CARE_PROVIDER_SITE_OTHER): Payer: Medicaid Other | Admitting: Cardiology

## 2019-08-23 ENCOUNTER — Other Ambulatory Visit: Payer: Self-pay

## 2019-08-23 ENCOUNTER — Encounter: Payer: Self-pay | Admitting: *Deleted

## 2019-08-23 VITALS — BP 110/80 | HR 94 | Ht 72.0 in | Wt 145.8 lb

## 2019-08-23 DIAGNOSIS — R0602 Shortness of breath: Secondary | ICD-10-CM

## 2019-08-23 DIAGNOSIS — Z720 Tobacco use: Secondary | ICD-10-CM

## 2019-08-23 DIAGNOSIS — I2510 Atherosclerotic heart disease of native coronary artery without angina pectoris: Secondary | ICD-10-CM | POA: Diagnosis not present

## 2019-08-23 DIAGNOSIS — Z13220 Encounter for screening for lipoid disorders: Secondary | ICD-10-CM

## 2019-08-23 DIAGNOSIS — J9611 Chronic respiratory failure with hypoxia: Secondary | ICD-10-CM

## 2019-08-23 DIAGNOSIS — J84112 Idiopathic pulmonary fibrosis: Secondary | ICD-10-CM

## 2019-08-23 NOTE — Telephone Encounter (Signed)
Pre-cert Verification for the following procedure    Lexiscan scheduled for 08-28-2019 at [facility]  Echo scheduled for 08-29-2019 at [facility]

## 2019-08-23 NOTE — Progress Notes (Signed)
Cardiology Office Note  Date: 08/23/2019   ID: Bradley Rice, DOB 1961-07-17, MRN [id number]  PCP:  Sharion Balloon, FNP  Consulting Cardiologist: Satira Sark, MD Electrophysiologist:  None   Chief Complaint  Patient presents with  . Cardiac assessment    History of Present Illness: Bradley Rice is a 59 y.o. male referred for cardiology consultation by Dr. Chase Caller with coronary artery calcifications documented by chest CT imaging.  He reports a remote history of cardiac catheterization and possible angioplasty approximately 30 years ago, I could not locate old records.  He has a longstanding history of tobacco abuse and also idiopathic pulmonary fibrosis with hypoxic respiratory failure in addition to ALS which is reportedly fairly mild at this time but does limit his activity.  He uses oxygen at nighttime, but has been hesitant to use it during the daytime with activity despite the fact that he has oxygen desaturation.  He reports dyspnea on exertion at NYHA class III and also describes potential angina symptoms.  Most recent chest CT was in March 2020 as outlined below, coronary artery calcifications have been documented on prior CT imaging going back over the last few years as well.  I personally reviewed his ECG today which shows normal sinus rhythm with nonspecific ST changes.  He has not had a recent lipid panel.  I reviewed his current medications.  Regimen includes low-dose aspirin and Toprol-XL.  He uses a rescue inhaler occasionally but has been able to tolerate beta-blocker.  Past Medical History:  Diagnosis Date  . ALS (amyotrophic lateral sclerosis) ([place])   . Back pain   . COPD (chronic obstructive pulmonary disease) ([place])   . Coronary artery calcification seen on CT scan   . DDD (degenerative disc disease), lumbar   . Depression   . Dysphagia    Following previous surgery  . History of blood transfusion 2014   Post-op bleed  . History of lung cancer   .  History of stroke 2015  . Idiopathic pulmonary fibrosis ([place])   . Migraine   . Spider bite 2012    Past Surgical History:  Procedure Laterality Date  . ANTERIOR CERVICAL DECOMP/DISCECTOMY FUSION  12/22/2011   Procedure: ANTERIOR CERVICAL DECOMPRESSION/DISCECTOMY FUSION 3 LEVELS;  Surgeon: Melina Schools, MD;  Location: [place];  Service: Orthopedics;  Laterality: N/A;  ACDF C5-T1  . ANTERIOR CERVICAL DECOMP/DISCECTOMY FUSION  06/25/2012   Procedure: ANTERIOR CERVICAL DECOMPRESSION/DISCECTOMY FUSION 1 LEVEL/HARDWARE REMOVAL;  Surgeon: [name], MD;  Location: [place];  Service: Orthopedics;  Laterality: N/A;  Removal anterior cervical plate C5-T1, Explore Fusion, Left C5-6, C6-7, C7-T1 Re-do Foraminotomy, Left open carpal tunnel release with release of ulnar nerve at [facility], Posterior Cervical Fusion with lateral mass screw  . BACK SURGERY    . CARDIAC CATHETERIZATION    . CARPAL TUNNEL RELEASE  06/25/2012   Procedure: CARPAL TUNNEL RELEASE;  Surgeon: [name], MD;  Location: [place];  Service: Orthopedics;  Laterality: Left;  as above  . CHOLECYSTECTOMY  2009  . CORONARY ANGIOPLASTY  2001   [facility]  . LUMBAR FUSION  2000  . POSTERIOR CERVICAL FUSION/FORAMINOTOMY  06/25/2012   Procedure: POSTERIOR CERVICAL FUSION/FORAMINOTOMY LEVEL 1;  Surgeon: [name], MD;  Location: [place];  Service: Orthopedics;  Laterality: N/A;  as above  . POSTERIOR CERVICAL FUSION/FORAMINOTOMY N/A 12/17/2013   Procedure: REMOVAL OF POSTERIOR CERVICAL FUSION LATERAL MASS SCREWS AND RODS C5-T1, EXPLORE FUSION, LEFT SIX-SEVEN FORAMINOTOMY;  Surgeon: [name], MD;  Location: [place];  Service: Orthopedics;  Laterality: N/A;  . VASECTOMY  1990    Current Outpatient Medications  Medication Sig Dispense Refill  . albuterol (PROVENTIL HFA;VENTOLIN HFA) 108 (90 Base) MCG/ACT inhaler Inhale 2 puffs into the lungs every 6 (six) hours as needed for wheezing or shortness of breath. 1 Inhaler 5  . albuterol  (PROVENTIL) (2.5 MG/3ML) 0.083% nebulizer solution Take 3 mLs (2.5 mg total) by nebulization every 6 (six) hours as needed for wheezing or shortness of breath. 150 mL 1  . aspirin EC 81 MG tablet Take 81 mg by mouth daily.    . baclofen (LIORESAL) 10 MG tablet Take 1 tablet (10 mg total) by mouth 3 (three) times daily. 30 each 0  . budesonide-formoterol (SYMBICORT) 160-4.5 MCG/ACT inhaler Inhale 2 puffs into the lungs 2 (two) times daily. 1 Inhaler 3  . methocarbamol (ROBAXIN) 750 MG tablet Take 1 tablet (750 mg total) by mouth 2 (two) times daily as needed. 180 tablet 1  . metoprolol succinate (TOPROL-XL) 50 MG 24 hr tablet Take 1 tablet (50 mg total) by mouth daily. 90 tablet 1  . Nutritional Supplements (ENSURE HIGH PROTEIN) LIQD Take 237 mLs by mouth 3 (three) times daily. 90 Can 5  . ondansetron (ZOFRAN) 4 MG tablet Take 1 tablet (4 mg total) by mouth 3 (three) times daily. 90 tablet 3  . oxyCODONE (ROXICODONE) 15 MG immediate release tablet Take 15 mg by mouth 4 (four) times daily.  0  . Pirfenidone (ESBRIET) 801 MG TABS Take 1 tablet by mouth 3 (three) times daily. Take with food. 90 tablet 2  . sildenafil (REVATIO) 20 MG tablet Take 2-5 tabs PRN prior to sexual activity.    Marland Kitchen tiotropium (SPIRIVA) 18 MCG inhalation capsule Place 1 capsule (18 mcg total) into inhaler and inhale daily. 30 capsule 6   No current facility-administered medications for this visit.   Allergies:  Patient has no known allergies.   Social History: The patient  reports that he has been smoking cigarettes. He has a 30.00 pack-year smoking history. He has never used smokeless tobacco. He reports that he does not drink alcohol or use drugs.   Family History: The patient's family history includes ALS in his mother and sister; Cancer in his brother, mother, and sister.   ROS:  Please see the history of present illness. Otherwise, complete review of systems is positive for none.  All other systems are reviewed and  negative.   Physical Exam: VS:  BP 110/80   Pulse 94   Ht 6' (1.829 m)   Wt 145 lb 12.8 oz (66.1 kg)   SpO2 98%   BMI 19.77 kg/m , BMI Body mass index is 19.77 kg/m.  Wt Readings from Last 3 Encounters:  08/23/19 145 lb 12.8 oz (66.1 kg)  06/25/19 144 lb (65.3 kg)  05/30/19 138 lb (62.6 kg)    General: Patient appears comfortable at rest.  Not wearing oxygen. HEENT: Conjunctiva and lids normal, wearing a mask. Neck: Supple, no elevated JVP or carotid bruits, no thyromegaly. Lungs: Coarse dry crackles throughout with scattered rhonchi, nonlabored breathing at rest. Cardiac: Regular rate and rhythm, no S3 or significant systolic murmur, no pericardial rub. Abdomen: Soft, nontender, bowel sounds present. Extremities: No pitting edema, distal pulses 1-2+.  Clubbing present. Skin: Warm and dry. Musculoskeletal: No kyphosis. Neuropsychiatric: Alert and oriented x3, affect grossly appropriate.  ECG:  An ECG dated 04/13/2017 was personally reviewed today and  demonstrated:  Normal sinus rhythm, nonspecific ST changes.  Recent Labwork: 06/25/2019: ALT 7; AST 11   Other Studies Reviewed Today:  High resolution chest CT 10/16/2018: FINDINGS: Cardiovascular: Coronary artery calcifications. Normal heart size. No pericardial effusion.  Mediastinum/Nodes: No enlarged mediastinal, hilar, or axillary lymph nodes. Thyroid gland, trachea, and esophagus demonstrate no significant findings.  Lungs/Pleura: There is redemonstrated moderate pulmonary fibrosis in a bibasilar predominant pattern featuring extensive peripheral bronchiolectasis, mild bronchiectasis, and areas of honeycombing, particularly in the right lung base. Fibrotic findings are significantly worsened in comparison to prior CT dated 12/23/2015, with increased bronchiolectasis. There is likely underlying paraseptal emphysema. There are multiple bilateral pulmonary nodules and areas of consolidative opacity which are  unchanged compared to prior examination dating back to 12/23/2015 and benign, for example a 7 mm nodule of the right upper lobe (series 10, image 131). No pleural effusion or pneumothorax.  Upper Abdomen: No acute abnormality.  Musculoskeletal: No chest wall mass or suspicious bone lesions identified.  IMPRESSION: 1. There is redemonstrated moderate pulmonary fibrosis in a bibasilar predominant pattern featuring extensive peripheral bronchiolectasis, mild bronchiectasis, and areas of honeycombing, particularly in the right lung base. Fibrotic findings are significantly worsened over time in comparison to prior CTs dating back to 12/23/2015, with increased bronchiolectasis. There is likely underlying paraseptal emphysema. Findings are generally consistent with UIP pattern pulmonary fibrosis by ATS pulmonary fibrosis criteria.  2. There are multiple bilateral pulmonary nodules and areas of consolidative opacity which are unchanged compared to prior examination dating back to 12/23/2015 and benign, for example a 7 mm nodule of the right upper lobe (series 10, image 131).  3.  Coronary artery disease.  Assessment and Plan:  1.  Coronary artery disease by history with report of angioplasty approximately 30 years ago but details not clear.  Recent serial chest CT imaging shows coronary artery calcification which would be consistent with this history.  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  He uses oxygen at nighttime, but not during the daytime when he is active and admits that his oxygen saturations dropped into the 70s during the daytime.  I reviewed his ECG which is overall nonspecific.  We will plan to obtain an echocardiogram to assess cardiac structure and function and also a Lexiscan Myoview to evaluate overall ischemic burden (would provide supplemental oxygen during the procedure).   Continue aspirin and Toprol-XL. We will get a lipid panel.  Office follow-up planned to discussed next step.  Unless he has high risk features, would anticipate medical therapy.  I suspect he would also do better symptomatically if he would use oxygen more consistently.  2.  Idiopathic pulmonary fibrosis with follow-up by Dr. Chase Caller.  3.  ALS, reportedly with mild symptoms but this does limit his activity to some degree.  4.  Longstanding tobacco abuse.  Smoking cessation is essential going forward.  Medication Adjustments/Labs and Tests Ordered: Current medicines are reviewed at length with the patient today.  Concerns regarding medicines are outlined above.   Tests Ordered: Orders Placed This Encounter  Procedures  . NM Myocar Multi W/Spect W/Wall Motion / EF  . Lipid panel  . ECHOCARDIOGRAM COMPLETE    Medication Changes: No orders of the defined types were placed in this encounter.   Disposition:  Follow up 3 to 4 weeks for reevaluation.  Signed, Satira Sark, MD, [facility] 08/23/2019 1:57 PM    [facility], [place], [place] [address] Phone: [phone]; Fax: [phone]

## 2019-08-23 NOTE — Patient Instructions (Addendum)
Medication Instructions:   Your physician recommends that you continue on your current medications as directed. Please refer to the Current Medication list given to you today.  Labwork: Your physician recommends that you return for a FASTING lipid profile: as soon as possible. Please do not eat or drink for at least 8 hours when you have this done. You may take your medications that morning with a sip of water. This can be done at [facility] on the same day as your stress test. Please take your lab order with you when you go that day.  Testing/Procedures: Your physician has requested that you have an echocardiogram. Echocardiography is a painless test that uses sound waves to create images of your heart. It provides your doctor with information about the size and shape of your heart and how well your heart's chambers and valves are working. This procedure takes approximately one hour. There are no restrictions for this procedure. Your physician has requested that you have a lexiscan myoview. For further information please visit [email or web address]. Please follow instruction sheet, as given.  Follow-Up:  Your physician recommends that you schedule a follow-up appointment in: 3-4 weeks (office).  Any Other Special Instructions Will Be Listed Below (If Applicable).  If you need a refill on your cardiac medications before your next appointment, please call your pharmacy.

## 2019-08-26 DIAGNOSIS — M5136 Other intervertebral disc degeneration, lumbar region: Secondary | ICD-10-CM | POA: Diagnosis not present

## 2019-08-26 DIAGNOSIS — Z79899 Other long term (current) drug therapy: Secondary | ICD-10-CM | POA: Diagnosis not present

## 2019-08-26 DIAGNOSIS — G894 Chronic pain syndrome: Secondary | ICD-10-CM | POA: Diagnosis not present

## 2019-08-26 NOTE — Telephone Encounter (Signed)
[facility] has been unsuccessful in reaching patient to schedule Esbriet refill. There were 7 attempts. Patient's last refill was on 06/17/19. Patient's pharmacy account has been placed on hold.

## 2019-08-26 NOTE — Addendum Note (Signed)
Addended by: Merlene Laughter on: 08/26/2019 01:35 PM   Modules accepted: Orders

## 2019-08-28 ENCOUNTER — Encounter (HOSPITAL_BASED_OUTPATIENT_CLINIC_OR_DEPARTMENT_OTHER)
Admission: RE | Admit: 2019-08-28 | Discharge: 2019-08-28 | Disposition: A | Discharge status: Home / Self Care | Payer: Medicaid Other | Source: Ambulatory Visit | Attending: Cardiology | Admitting: Cardiology

## 2019-08-28 ENCOUNTER — Encounter (HOSPITAL_COMMUNITY): Payer: Self-pay

## 2019-08-28 ENCOUNTER — Encounter (HOSPITAL_COMMUNITY)
Admission: RE | Admit: 2019-08-28 | Discharge: 2019-08-28 | Disposition: A | Discharge status: Home / Self Care | Payer: Medicaid Other | Source: Ambulatory Visit | Attending: Cardiology | Admitting: Cardiology

## 2019-08-28 ENCOUNTER — Other Ambulatory Visit: Payer: Self-pay

## 2019-08-28 DIAGNOSIS — R0602 Shortness of breath: Secondary | ICD-10-CM

## 2019-08-28 DIAGNOSIS — I2510 Atherosclerotic heart disease of native coronary artery without angina pectoris: Secondary | ICD-10-CM | POA: Diagnosis not present

## 2019-08-28 HISTORY — DX: Unspecified asthma, uncomplicated: J45.909

## 2019-08-28 LAB — NM MYOCAR MULTI W/SPECT W/WALL MOTION / EF
LV dias vol: 103 mL (ref 62–150)
LV sys vol: 41 mL
Peak HR: 98 {beats}/min
RATE: 0.45
Rest HR: 80 {beats}/min
SDS: 2
SRS: 0
SSS: 2
TID: 0.93

## 2019-08-28 MED ORDER — TECHNETIUM TC 99M TETROFOSMIN IV KIT
10.0000 | PACK | Freq: Once | INTRAVENOUS | Status: AC | PRN
  Administered 2019-08-28: 09:00:00 11 via INTRAVENOUS

## 2019-08-28 MED ORDER — REGADENOSON 0.4 MG/5ML IV SOLN
INTRAVENOUS | Status: AC
  Administered 2019-08-28: 10:00:00 0.4 mg via INTRAVENOUS
  Filled 2019-08-28: qty 5

## 2019-08-28 MED ORDER — TECHNETIUM TC 99M TETROFOSMIN IV KIT
30.0000 | PACK | Freq: Once | INTRAVENOUS | Status: AC | PRN
  Administered 2019-08-28: 10:00:00 31 via INTRAVENOUS

## 2019-08-28 MED ORDER — SODIUM CHLORIDE FLUSH 0.9 % IV SOLN
INTRAVENOUS | Status: AC
  Administered 2019-08-28: 10:00:00 10 mL via INTRAVENOUS
  Filled 2019-08-28: qty 10

## 2019-08-29 ENCOUNTER — Telehealth: Payer: Self-pay | Admitting: *Deleted

## 2019-08-29 ENCOUNTER — Ambulatory Visit (INDEPENDENT_AMBULATORY_CARE_PROVIDER_SITE_OTHER): Payer: Medicaid Other

## 2019-08-29 DIAGNOSIS — R0602 Shortness of breath: Secondary | ICD-10-CM | POA: Diagnosis not present

## 2019-08-29 NOTE — Telephone Encounter (Signed)
Patient informed. Copy sent to PCP °

## 2019-08-29 NOTE — Telephone Encounter (Signed)
-----   Message from Satira Sark, MD sent at 08/28/2019  4:07 PM EST ----- Results reviewed.  Myoview was normal, no evidence of ischemia to suggest coronary artery calcifications by chest CT correspond with obstructive stenoses.  Recommend medical therapy.  Follow-up as already arranged.

## 2019-09-02 DIAGNOSIS — J84112 Idiopathic pulmonary fibrosis: Secondary | ICD-10-CM | POA: Diagnosis not present

## 2019-09-02 DIAGNOSIS — J439 Emphysema, unspecified: Secondary | ICD-10-CM | POA: Diagnosis not present

## 2019-09-03 ENCOUNTER — Telehealth: Payer: Self-pay | Admitting: *Deleted

## 2019-09-03 NOTE — Telephone Encounter (Signed)
-----   Message from Erma Heritage, [place] sent at 08/29/2019  8:20 PM EST ----- Covering for Dr. Domenic Polite - Please let the patient know his echocardiogram showed normal pumping function of the heart with no wall motion abnormalities. No evidence of valve abnormalities. Overall, a reassuring study. Please forward a copy of results to Sharion Balloon, FNP.

## 2019-09-06 NOTE — Telephone Encounter (Signed)
Mychart msg sent with results Copy sent to PCP

## 2019-09-12 ENCOUNTER — Ambulatory Visit: Payer: Medicaid Other | Admitting: Cardiology

## 2019-09-20 ENCOUNTER — Telehealth: Payer: Self-pay | Admitting: Internal Medicine

## 2019-09-20 NOTE — Telephone Encounter (Addendum)
Bradley Rice  Last PFT was October 2020   In review of the chart I thought he was not taking his pirfenidone.  Therefore I had Mendel Ryder from research reach out to him regarding [employer] study.  He told her that he was interested in research but he also told initially that he was taking the pirfenidone but later he told her that he was having side effects from it and had not been taking it for at least 1 month.  He also told her he prefers to stop taking pirfenidone.  He also mentioned that he has not gotten any refills.  There is a little bit puzzling because it is conflicting information.  Based on chart review looks like our office I tried to reach you multiple times but Mendel Ryder had no problems reaching him over the phone  Plan  -Please give him a telephone visit on Monday September 22, 2019 in afternoon -I need to discuss with him whether he is really interested in taking antifibrotic size just not able to access them.  Because if he is not interested in taking antifibrotic's and he would qualify for protocols and research that do not involve antifibrotic's. But the main focus should be a first try to get him on antifibrotic and keep him on antifibrotic and manage the antifibrotic unless he absolutely does not want to or is unable to   -Second choice vitist 15-minute telephone visit Tuesday, September 23, 2019 [please note that I am actually not in clinic on February 22 but someone thinks I am in clinic in the started adding patients.  I was only supposed to see 1 research patient.  However, I can see this 1 patient on telephone visit and whoever else is scheduled]     SIGNATURE    Dr. Brand Males, M.D., F.C.C.P,  Pulmonary and Critical Care Medicine Staff Physician, [place] Director - Interstitial Lung Disease  Program  Pulmonary [place] [facility] [place], [place], [address]  Pager: [phone], If no answer or between  15:00h -  7:00h: call [phone] Telephone: [phone]  9:33 AM 09/20/2019

## 2019-09-23 ENCOUNTER — Telehealth: Payer: Self-pay | Admitting: Pharmacy Technician

## 2019-09-23 ENCOUNTER — Telehealth: Payer: Self-pay | Admitting: Family

## 2019-09-23 ENCOUNTER — Ambulatory Visit (INDEPENDENT_AMBULATORY_CARE_PROVIDER_SITE_OTHER): Payer: Medicaid Other | Admitting: Internal Medicine

## 2019-09-23 DIAGNOSIS — R112 Nausea with vomiting, unspecified: Secondary | ICD-10-CM | POA: Diagnosis not present

## 2019-09-23 DIAGNOSIS — J84112 Idiopathic pulmonary fibrosis: Secondary | ICD-10-CM | POA: Diagnosis not present

## 2019-09-23 DIAGNOSIS — Z79899 Other long term (current) drug therapy: Secondary | ICD-10-CM

## 2019-09-23 DIAGNOSIS — J439 Emphysema, unspecified: Secondary | ICD-10-CM | POA: Diagnosis not present

## 2019-09-23 DIAGNOSIS — Z5181 Encounter for therapeutic drug level monitoring: Secondary | ICD-10-CM

## 2019-09-23 DIAGNOSIS — J8410 Pulmonary fibrosis, unspecified: Secondary | ICD-10-CM

## 2019-09-23 NOTE — Telephone Encounter (Signed)
   SIGNATURE    Dr. Brand Males, M.D., F.C.C.P,  Pulmonary and Critical Care Medicine Staff Physician, [place] Director - Interstitial Lung Disease  Program  Pulmonary [place] at [place], [place], [address]  Pager: [phone], If no answer or between  15:00h - 7:00h: call [phone] Telephone: [phone]  5:07 PM 09/23/2019

## 2019-09-23 NOTE — Patient Instructions (Addendum)
ICD-10-CM   1. IPF (idiopathic pulmonary fibrosis) ([place])  J84.112   2. 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 Pulmonary emphysema, unspecified emphysema type ([place])  J43.9   4. Drug-induced nausea and vomiting  R11.2    T50.905A   5. Encounter for drug therapy  Z79.899       IPF (idiopathic pulmonary fibrosis) (HCC) Familial idiopathic pulmonary fibrosis (H[facility])  Drug-induced nausea and vomiting  And weight los-- new due to esbriet Encounter for drug therapy  -IPF itself is stable -But he had significant nausea, vomiting and weight loss with pirfenidone/Esbriet  -Glad symptoms have resolved and the weight is coming back now 1 month after stopping pirfenidone Plan -List pirfenidone as an allergy with side effects of GI -Start nintedanib 150 mg twice daily with food  -There is no guarantee that he will not have side effects with this drug but it is also possible that he will not have side effects with this drug.  Will have to take this twice daily with food and will have to intensity monitor  -Research as a care option only after you have and documented stability with nintedanib or intolerance but not at this stage today  Pulmonary emphysema, unspecified emphysema type (HCC)  Stable -Continue Spiriva and Symbicort daily with albuterol as needed   Follow-up -Cancel October 01, 2019 research visit -15-minute telephone visit in 4-5 weeks to see progress with nintedanib -Do spirometry and DLCO in April 2021 -Face-to-face visit in April 2021 but after spirometry and DLCO -15-minute visit in [facility] clinic or any clinic

## 2019-09-23 NOTE — Telephone Encounter (Signed)
Called and spoke with pt letting him know that MR wanted to do a televisit with him as a follow up and he verbalized understanding. Pt has been scheduled for a televisit today at 1:30 with MR. Nothing further needed.

## 2019-09-23 NOTE — Addendum Note (Signed)
Addended by: Lorretta Harp on: 09/23/2019 02:58 PM   Modules accepted: Orders

## 2019-09-23 NOTE — Telephone Encounter (Signed)
MyChart appt made for Fri 09/27/19

## 2019-09-23 NOTE — Progress Notes (Signed)
OV Nov 2017 History of Present Illness Bradley Rice is a 59 y.o. male for evaluation of sleep problems.  He has noticed trouble falling asleep and staying asleep.  He feels some of this is related to neck and back pain.  His wife has told him that he snores, and will stop breathing while asleep.  He has a hard time slowing things down when he is trying to sleep.  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  He reports not being seen by a neurologist for past 2 years.  He also reports history of non small cell lung cancer from about 2 years ago.  He was told the ALS therapy and cancer therapy couldn't be given together, so he stopped both therapies.  He reports seeing a lung doctor about 2 years ago, but was only told that he had lung cancer but no other lung disease that he is aware of.  He had all these appointments in [place], and he thinks these were at [facility].  He goes to sleep at 10 pm.  He falls asleep after 2 hours.  He wakes up 3 or 4 times, and then has trouble falling back to sleep.  He gets out of bed at 7 am.  He feels tired in the morning.  He denies morning headache.  He does not use anything to help him fall sleep or stay awake.  He denies sleep walking, sleep talking, bruxism, or nightmares.  There is no history of restless legs.  He denies sleep hallucinations, sleep paralysis, or cataplexy.  The Epworth score is 9 out of 24.   07/06/2018  Patient presents today to follow-up on pulmonary nodules. He was seen back in 2017 by Dr. Halford Chessman and ordered for ONO, full pulmonary function testing, labs and serology. Former smoker, smoked 1ppd from the age [age]. Reports quitting in 2012.   Complains of coughing fits with clear mucus x 25months. He experiences shortness of breath when coughing. States that he coughs so much at night that he throws up and reports that he stops breathing. Rescue inhaler helps  some. Unsure if he is using Symbicort twice daily as prescribed. Stopped breo.   CT Chest 05/15/18- Re-demonstration bilateral lung nodules, no new or enlarging nodules since June 2019. 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 Recommend follow up 6-12 months. Follow CT out for 2 years. Refer to ILD clinic. Refer to lung cancer screening.   Exposure history: Work- [employer], he was around fumes and gas without a mask (on disability for neck/back pain) NO birds, cats/dogs Has carpet No hot tub No beach house  Spirometry 07/06/2018 FVC 3.1 (59%), FEV1 2.7 (67%), ratio  87 (unable to complete)  Pulmonary tests CT angio chest 12/23/15 >> mild paraseptal emphysema, progressive fibrosis, several b/l nodules up to 8 mm     OV 08/23/2018  Subjective:  Patient ID: Bradley Rice, male , DOB: 06/20/1961 , age 64 y.o. , MRN: [id number] , ADDRESS: [place] [id number]   08/23/2018 -   Chief Complaint  Patient presents with  . Follow-up    PFT not performed.  Pt states he is getting better and states he is no longer running a fever. Pt states that he does have complaints of SOB, cough with occ white phlegm, and CP.     HPI [name] 59  y.o. -presents to the ILD clinic as a second opinion referral from Dr. Ricarda Frame nurse practitioner.  He has a diagnosis of ALS and under follow-up at [facility].  He is still fairly functional and that he can do his activities of daily living.  He does not have any dysphagia.  He has now developed interstitial lung disease on top of his underlying emphysema which she is known to have.  Therefore has been referred here.  He tells me that he has had insidious worsening of shortness of breath for the last year or so.  He has difficulty doing activities of daily living because of dyspnea.  He does not believe his ALS is causing his dyspnea.  He also has significant severe chronic  cough but sometimes causes near presyncope.  He is only on Symbicort for his previous diagnosis of emphysema.  He was last seen in our practice in 2017 but after that reestablished in the last few months during which time the ILD was discovered and he is now been referred to the ILD clinic.  He reports having answered all the questions on interstitial lung disease questionnaire but this questionnaire is now missing with his answers.  Therefore he had to redo. Does not want to do it 08/23/18    CT scan of the chest January 24, 2018 in my personal visualization shows classic UIP findings.  This includes bilateral bibasal subpleural disease with honeycombing and traction bronchiectasis.  Further up in the lungs is emphysema with subpleural bulla as opposed to honeycombing.  These findings predate 2017.  Looking back he might have early ILD changes even in 2014 CT chest.    Results for HANCE, CASPERS (MRN [id number]) as of 08/23/2018 09:45  Ref. Range 06/10/2016 14:48 07/06/2018 10:53  Anti Nuclear Antibody(ANA) Latest Ref Range: NEGATIVE  Negative NEGATIVE  Cyclic Citrullin Peptide Ab Latest Units: UNITS <16 <16  ds DNA Ab Latest Units: IU/mL  <1  Jo-1 Antibody, IgG Latest Ref Range: <1.0 NEG AI  <1.0 NEG   RA Latex Turbid. Latest Ref Range: <14 IU/mL <14 <14  SSA (Ro) (ENA) Antibody, IgG Latest Ref Range: <1.0 NEG AI <1.0 NEG <1.0 NEG  SSB (La) (ENA) Antibody, IgG Latest Ref Range: <1.0 NEG AI <1.0 NEG <1.0 NEG  Scleroderma (Scl-70) (ENA) Antibody, IgG Latest Ref Range: <1.0 NEG AI <1.0 NEG <1.0 NEG    OV 05/30/2019  Subjective:  Patient ID: Bradley Rice, male , DOB: 06/25/1961 , age 89 y.o. , MRN: [id number] , ADDRESS: [place] [address]   05/30/2019 -   Chief Complaint  Patient presents with  . Follow-up    Patient reports that he still has sob with exertion and occ. cough.    Follow-up IPF [diagnosis given January 2020, clinical diagnosis, based on male gender, clubbing, family  history, smoking history, negative serology and associated emphysema] with associated emphysema in the setting of associated ALS.  Delays with Esbriet start in early 2020  HPI HADRIAN YARBROUGH 59 y.o. -presents for follow-up.  Last seen in early 2020.  After that lost to follow-up because of the COVID-19 pandemic.  He tells me that since early this year he is significantly worse with shortness of breath and cough.  His symptom scores are listed below and is significant.  He says his cough is relieved only by using his mom's oxygen.  I find out that his mom also suffers from pulmonary fibrosis and sees somebody  at [facility].  He says the shortness of breath is also relieved by his mom's portable oxygen.  He is very frustrated by his symptoms.  He states his lung is full of crackles.  Apparently try to attend pulmonary rehabilitation earlier in 2020 had any pain but because of his ALS was deemed unsafe.  I am not able to find the documentation on this.  At last visit I started him on pirfenidone/Esbriet.  He tells me that he took a month supply of this.  He says he is frustrated by our office there was significant delays in getting him the [place] at proper pricing.  Apparently he had to pay a co-pay $1500 but later this got reduced to $500.  Nevertheless it was too expensive.  Currently is just on supportive care   He is not on any oxygen therapy.  He is using his mom's oxygen.  This is based on no evidence of desaturation.  However he tells me this is the main thing that helps his cough and shortness of breath.  He is willing to see go through overnight oxygen testing and see if he desaturates and if so start oxygen and see if his cough improves.  He says that cough medicines have not helped.  He was asking for stronger medication but after discussion he is willing to wait.  Discussed research concepts  Overall in terms of his ALS it is stable  He has had his flu shot.   He also has emphysema: He was  on an inhaled anticholinergic but he is run out and not taking.    IMPRESSION:COMPARISON:  CT chest, 05/15/2018, 01/24/2018, 12/23/2015 1. There is redemonstrated moderate pulmonary fibrosis in a bibasilar predominant pattern featuring extensive peripheral bronchiolectasis, mild bronchiectasis, and areas of honeycombing, particularly in the right lung base. Fibrotic findings are significantly worsened over time in comparison to prior CTs dating back to 12/23/2015, with increased bronchiolectasis. There is likely underlying paraseptal emphysema. Findings are generally consistent with UIP pattern pulmonary fibrosis by ATS pulmonary fibrosis criteria.  2. There are multiple bilateral pulmonary nodules and areas of consolidative opacity which are unchanged compared to prior examination dating back to 12/23/2015 and benign, for example a 7 mm nodule of the right upper lobe (series 10, image 131).  3.  Coronary artery disease.   Electronically Signed   By: Eddie Candle M.D.   On: 10/17/2018 09:11   OV 06/25/2019  Subjective:  Patient ID: Bradley Rice, male , DOB: [birth year] , age 39 y.o. , MRN: [id number] , ADDRESS: [place] [address]   06/25/2019 -   Chief Complaint  Patient presents with  . Follow-up    6MW attempted and pt stated he was not able to complete the entire walk due to breathing. Pt states he is becoming very SOB much easier and is having drops in O2 sats. Pt states he is also coughing.    Follow-up IPF [diagnosis given January 2020, clinical diagnosis, based on male gender, clubbing, family history, smoking history, negative serology and associated emphysema] with associated emphysema in the setting of associated ALS.  Delays with Esbriet start in early 2020. Restart esbriet 06/18/2019 or 06/19/2019  Known associated emphysema with Spiriva and Symbicort  Coronary artery calcification seen on CT scan March 2020.  HPI  HOOPER PETTEWAY 59 y.o.  -presents for the above issues for discussion only visit.  At this visit we are here to follow how he is tolerating pirfenidone.  He is 1 week into pirfenidone at this point.  He is still on 1 pill 3 times daily.  He says he is spacing at least 6 7 hours between the pills.  He takes it with food.  He is trying different foods.  Despite this he is having mild to moderate amount of nausea.  He says this happened the previous time as well.  He says he tried some Dramamine yesterday but this did not help or helped somewhat.  He is willing to try other options.  We discussed Zofran and ginger capsules.  Also last time he did not desaturate but he is having significant amount of dyspnea.  Therefore we did a 6-minute walk test.  He did 7 laps and stop but he dropped only pulse ox of 2% from 100% to 98%.  He is out of proportion dyspnea.  He also tells me now that he has some occasional chest pains with exertion relieved by rest.  CT scan in March 2020 shows coronary artery calcification.  Otherwise he is stable.  OV 09/23/2019 -telephone visit.  Risks, benefits and limitations of telephone visit explained.  Patient identified with 2 identifiers.  Subjective:  Patient ID: Bradley Rice, male , DOB: 1960-12-07 , age 4 y.o. , MRN: [id number] , ADDRESS: [place] [id number]   09/23/2019 -    FU IPF Fu associated emphysema - spiriva/symbicort   HPI ROEL DOUTHAT 59 y.o. -last seen just before Thanksgiving 2020.  He had GI intolerance with pirfenidone.  We did some modifications to the regimen.  After that there was some difficulty getting hold of him but he finally did.  He tells me that 1 month ago he stopped his pirfenidone because of significant nausea vomiting and weight loss.  He says he followed all the instructions I gave him with modification.  He absolutely will not go back to this medication.  He says since stopping the medication his GI symptoms have resolved and he is gaining his weight back  although not back to baseline.  He is frustrated by the experience.  In fact he almost wanted to join a IV infusion research protocol that involve placebo because it would not have GI side effects.  This telephone visit is to discuss neck step in his management approach.  I explained to him that nintedanib is the next logical step in his standard of care approach.  The other option would be to join there is zephyrus  IV infusion protocol and once he is on stable IV infusion does then consider adding nintedanib.  The other option is to go to nintedanib right now and then depending on stability versus failure consider the next appropriate research protocol.  He defer the decision on this to me.  But he was willing to go over the standard of care approach and stop nintedanib if he had GI side effects.   SYMPTOM SCALE - ILD 05/30/2019  06/25/2019   O2 use ra   Shortness of Breath 0 -> 5 scale with 5 being worst (score 6 If unable to do)   At rest 4 5  Simple tasks - showers, clothes change, eating, shaving 4 3  Household (dishes, doing bed, laundry) 4 3  Shopping 5 5  Walking level at own pace 5 4  Walking keeping up with others of same age 34 4  Walking up Stairs 5 5  Walking up Hill 5 5  Total (40 - 48) Dyspnea Score 37  34  How bad is your cough? 5 4  How bad is your fatigue 4 4         Simple office walk 185 feet x  3 laps goal with forehead probe 08/23/2018  05/30/2019  06/25/2019   O2 used Room air Room air RA  Number laps completed 3 3 6wmd  Comments about pace Slow pace* steady 7 laps  Resting Pulse Ox/HR 99% and 93/min 98% and HR 96/min   Final Pulse Ox/HR 98% and 110/min 98% and HR 105/mn   Desaturated </= 88% no no   Desaturated <= 3% points no no   Got Tachycardic >/= 90/min yes Yes even at rest   Symptoms at end of test Moderate dyspnea, lightheaded moderate Did ntt drop pulse ox . sTiooed due to dyspnea  Miscellaneous comments  stable    Results for GAVIN, TELFORD  (MRN [id number]) as of 05/30/2019 11:49  Ref. Range 05/30/2019 10:38  FVC-Pre Latest Units: L 3.30  FVC-%Pred-Pre Latest Units: % 64  FEV1-Pre Latest Units: L 2.55  FEV1-%Pred-Pre Latest Units: % 64  Pre FEV1/FVC ratio Latest Units: % 77  Results for KEYLER, HOGE (MRN [id number]) as of 05/30/2019 11:49  Ref. Range 05/30/2019 10:38  DLCO unc Latest Units: ml/min/mmHg 12.85  DLCO unc % pred Latest Units: % 43      ROS - per HPI     has a past medical history of ALS (amyotrophic lateral sclerosis) ([place]), Asthma, Back pain, COPD (chronic obstructive pulmonary disease) ([place]), Coronary artery calcification seen on CT scan, DDD (degenerative disc disease), lumbar, Depression, Dysphagia, History of blood transfusion (2014), History of lung cancer, History of stroke (2015), Idiopathic pulmonary fibrosis ([place]), Migraine, and Spider bite (2012).   reports that he has been smoking cigarettes. He has a 30.00 pack-year smoking history. He has never used smokeless tobacco.  Past Surgical History:  Procedure Laterality Date  . ANTERIOR CERVICAL DECOMP/DISCECTOMY FUSION  12/22/2011   Procedure: ANTERIOR CERVICAL DECOMPRESSION/DISCECTOMY FUSION 3 LEVELS;  Surgeon: Melina Schools, MD;  Location: [facility];  Service: Orthopedics;  Laterality: N/A;  ACDF C5-T1  . ANTERIOR CERVICAL DECOMP/DISCECTOMY FUSION  06/25/2012   Procedure: ANTERIOR CERVICAL DECOMPRESSION/DISCECTOMY FUSION 1 LEVEL/HARDWARE REMOVAL;  Surgeon: [name], MD;  Location: [facility];  Service: Orthopedics;  Laterality: N/A;  Removal anterior cervical plate C5-T1, Explore Fusion, Left C5-6, C6-7, C7-T1 Re-do Foraminotomy, Left open carpal tunnel release with release of ulnar nerve at [facility], Posterior Cervical Fusion with lateral mass screw  . BACK SURGERY    . CARDIAC CATHETERIZATION    . CARPAL TUNNEL RELEASE  06/25/2012   Procedure: CARPAL TUNNEL RELEASE;  Surgeon: [name], MD;  Location: [facility];  Service: Orthopedics;   Laterality: Left;  as above  . CHOLECYSTECTOMY  2009  . CORONARY ANGIOPLASTY  2001   [facility]  . LUMBAR FUSION  2000  . POSTERIOR CERVICAL FUSION/FORAMINOTOMY  06/25/2012   Procedure: POSTERIOR CERVICAL FUSION/FORAMINOTOMY LEVEL 1;  Surgeon: [name], MD;  Location: [facility];  Service: Orthopedics;  Laterality: N/A;  as above  . POSTERIOR CERVICAL FUSION/FORAMINOTOMY N/A 12/17/2013   Procedure: REMOVAL OF POSTERIOR CERVICAL FUSION LATERAL MASS SCREWS AND RODS C5-T1, EXPLORE FUSION, LEFT SIX-SEVEN FORAMINOTOMY;  Surgeon: [name], MD;  Location: [facility];  Service: Orthopedics;  Laterality: N/A;  . VASECTOMY  1990    No Known Allergies  Immunization History  Administered Date(s) Administered  . Influenza Split 06/26/2012  . Influenza,inj,Quad PF,6+ Mos  08/24/2015, 08/25/2016, 04/30/2018  . Influenza-Unspecified 06/26/2012, 08/24/2015, 08/25/2016, 05/21/2019  . Pneumococcal Polysaccharide-23 06/25/2019  . Tdap 06/01/2013    Family History  Problem Relation Age of Onset  . ALS Mother   . Cancer Mother   . Cancer Sister   . ALS Sister   . Cancer Brother        1 brother  . Anesthesia problems Neg Hx      Current Outpatient Medications:  .  albuterol (PROVENTIL HFA;VENTOLIN HFA) 108 (90 Base) MCG/ACT inhaler, Inhale 2 puffs into the lungs every 6 (six) hours as needed for wheezing or shortness of breath., Disp: 1 Inhaler, Rfl: 5 .  albuterol (PROVENTIL) (2.5 MG/3ML) 0.083% nebulizer solution, Take 3 mLs (2.5 mg total) by nebulization every 6 (six) hours as needed for wheezing or shortness of breath., Disp: 150 mL, Rfl: 1 .  aspirin EC 81 MG tablet, Take 81 mg by mouth daily., Disp: , Rfl:  .  baclofen (LIORESAL) 10 MG tablet, Take 1 tablet (10 mg total) by mouth 3 (three) times daily., Disp: 30 each, Rfl: 0 .  budesonide-formoterol (SYMBICORT) 160-4.5 MCG/ACT inhaler, Inhale 2 puffs into the lungs 2 (two) times daily., Disp: 1 Inhaler, Rfl: 3 .  methocarbamol (ROBAXIN) 750 MG  tablet, Take 1 tablet (750 mg total) by mouth 2 (two) times daily as needed., Disp: 180 tablet, Rfl: 1 .  metoprolol succinate (TOPROL-XL) 50 MG 24 hr tablet, Take 1 tablet (50 mg total) by mouth daily., Disp: 90 tablet, Rfl: 1 .  Nutritional Supplements (ENSURE HIGH PROTEIN) LIQD, Take 237 mLs by mouth 3 (three) times daily., Disp: 90 Can, Rfl: 5 .  ondansetron (ZOFRAN) 4 MG tablet, Take 1 tablet (4 mg total) by mouth 3 (three) times daily., Disp: 90 tablet, Rfl: 3 .  oxyCODONE (ROXICODONE) 15 MG immediate release tablet, Take 15 mg by mouth 4 (four) times daily., Disp: , Rfl: 0 .  Pirfenidone (ESBRIET) 801 MG TABS, Take 1 tablet by mouth 3 (three) times daily. Take with food., Disp: 90 tablet, Rfl: 2 .  sildenafil (REVATIO) 20 MG tablet, Take 2-5 tabs PRN prior to sexual activity., Disp: , Rfl:  .  tiotropium (SPIRIVA) 18 MCG inhalation capsule, Place 1 capsule (18 mcg total) into inhaler and inhale daily., Disp: 30 capsule, Rfl: 6      Objective:   There were no vitals filed for this visit.  Estimated body mass index is 19.77 kg/m as calculated from the following:   Height as of 08/23/19: 6' (1.829 m).   Weight as of 08/23/19: 145 lb 12.8 oz (66.1 kg).  @WEIGHTCHANGE @  There were no vitals filed for this visit.   Physical Exam sounded normal on the phone        Assessment:       ICD-10-CM   1. IPF (idiopathic pulmonary fibrosis) ([place])  J84.112   2. 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 Pulmonary emphysema, unspecified emphysema type ([place])  J43.9   4. Drug-induced nausea and vomiting  R11.2    T50.905A   5. Encounter for drug therapy  Z79.899        Plan:     Patient Instructions     ICD-10-CM   1. IPF (idiopathic pulmonary fibrosis) ([place])  J84.112   2. 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 Pulmonary emphysema, unspecified emphysema type ([place])  J43.9   4. Drug-induced nausea and vomiting  R11.2    T50.905A   5. Encounter  for drug therapy  Z79.899       IPF (idiopathic pulmonary fibrosis) (HCC) Familial idiopathic pulmonary fibrosis (HCC)  Drug-induced nausea and vomiting  And weight los-- new due to esbriet Encounter for drug therapy  -IPF itself is stable -But he had significant nausea, vomiting and weight loss with pirfenidone/Esbriet  -Glad symptoms have resolved and the weight is coming back now 1 month after stopping pirfenidone Plan -List pirfenidone as an allergy with side effects of GI -Start nintedanib 150 mg twice daily with food  -There is no guarantee that he will not have side effects with this drug but it is also possible that he will not have side effects with this drug.  Will have to take this twice daily with food and will have to intensity monitor  -Research as a care option only after you have and documented stability with nintedanib or intolerance but not at this stage today  Pulmonary emphysema, unspecified emphysema type (HCC)  Stable -Continue Spiriva and Symbicort daily with albuterol as needed   Follow-up -Cancel October 01, 2019 research visit -15-minute telephone visit in 4-5 weeks to see progress with nintedanib -Do spirometry and DLCO in April 2021 -Face-to-face visit in April 2021 but after spirometry and DLCO -15-minute visit in ILD clinic or any clinic    ( Level 03: Esbt 20-29 min min visit spent in visit type: telephone visit in total care time and counseling or/and coordination of care by this undersigned MD - Dr Brand Males. This includes one or more of the following all delivered on this same day 09/23/2019: pre-charting, chart review, note writing, documentation discussion of test results, diagnostic or treatment recommendations, prognosis, risks and benefits of management options, instructions, education, compliance or risk-factor reduction. It excludes time spent by the [facility] or office staff in the care of the patient. Actual time was 20 min)   SIGNATURE     Dr. Brand Males, M.D., F.C.C.P,  Pulmonary and Critical Care Medicine Staff Physician, [place] Director - Interstitial Lung Disease  Program  P[facility] [place] [facility] [place], [place], [address]  Pager: [phone], If no answer or between  15:00h - 7:00h: call [phone] Telephone: [phone]  2:01 PM 09/23/2019

## 2019-09-23 NOTE — Telephone Encounter (Signed)
Received New Start paperwork for Ofev 150mg  BID. Patient is switching from Esbriet due to GI Upset. Verified on [place] Medicaid website that no prior approval is required for Ofev 150mg .  3:52 PM Beatriz Chancellor, CPhT

## 2019-09-23 NOTE — Telephone Encounter (Signed)
What is the name of the medication? Ensure  Have you contacted your pharmacy to request a refill? Yes  Which pharmacy would you like this sent to? [facility]  Patient notified that their request is being sent to the clinical staff for review and that they should receive a call once it is complete. If they do not receive a call within 24 hours they can check with their pharmacy or our office.   [facility] pt  RX for L[facility] Chair  Please call pt.

## 2019-09-23 NOTE — Telephone Encounter (Signed)
LMTCB to offer televisit for 2/23 since there is no schedule for 2/22 pm and will forward to [place] to f/u on

## 2019-09-25 MED ORDER — OFEV 150 MG PO CAPS: 150.0000 mg | ORAL_CAPSULE | Freq: Two times a day (BID) | ORAL | 0 refills | Status: DC

## 2019-09-25 NOTE — Telephone Encounter (Signed)
[facility] does not have access to Ofev at this time.  Prescription sent to [facility].  Called patient and notified of pharmacy change. He was given the number to [facility] to call and schedule first shipment.  Instructed patient to call with any issues obtaining medication or if he experiences side effects.  Mariella Saa, PharmD, [place], [place] Clinical Specialty Pharmacist [phone]  09/25/2019 3:08 PM

## 2019-09-27 ENCOUNTER — Telehealth (INDEPENDENT_AMBULATORY_CARE_PROVIDER_SITE_OTHER): Payer: Medicaid Other | Admitting: Family

## 2019-09-27 ENCOUNTER — Telehealth: Payer: Self-pay | Admitting: Internal Medicine

## 2019-09-27 ENCOUNTER — Encounter: Payer: Self-pay | Admitting: Family

## 2019-09-27 DIAGNOSIS — R531 Weakness: Secondary | ICD-10-CM

## 2019-09-27 DIAGNOSIS — G894 Chronic pain syndrome: Secondary | ICD-10-CM | POA: Diagnosis not present

## 2019-09-27 DIAGNOSIS — M545 Low back pain: Secondary | ICD-10-CM | POA: Diagnosis not present

## 2019-09-27 DIAGNOSIS — Z9181 History of falling: Secondary | ICD-10-CM

## 2019-09-27 DIAGNOSIS — J8410 Pulmonary fibrosis, unspecified: Secondary | ICD-10-CM

## 2019-09-27 DIAGNOSIS — G8929 Other chronic pain: Secondary | ICD-10-CM

## 2019-09-27 DIAGNOSIS — F17200 Nicotine dependence, unspecified, uncomplicated: Secondary | ICD-10-CM

## 2019-09-27 DIAGNOSIS — R911 Solitary pulmonary nodule: Secondary | ICD-10-CM

## 2019-09-27 DIAGNOSIS — R634 Abnormal weight loss: Secondary | ICD-10-CM

## 2019-09-27 DIAGNOSIS — Z79899 Other long term (current) drug therapy: Secondary | ICD-10-CM | POA: Diagnosis not present

## 2019-09-27 DIAGNOSIS — M5136 Other intervertebral disc degeneration, lumbar region: Secondary | ICD-10-CM | POA: Diagnosis not present

## 2019-09-27 MED ORDER — ENSURE HIGH PROTEIN PO LIQD: 237.0000 mL | Freq: Three times a day (TID) | ORAL | 6 refills | Status: DC

## 2019-09-27 MED ORDER — OFEV 150 MG PO CAPS: 150.0000 mg | ORAL_CAPSULE | Freq: Two times a day (BID) | ORAL | 3 refills | Status: DC

## 2019-09-27 NOTE — Progress Notes (Signed)
Virtual Visit via telephone Note Due to COVID-19 pandemic this visit was conducted virtually. This visit type was conducted due to national recommendations for restrictions regarding the COVID-19 Pandemic (e.g. social distancing, sheltering in place) in an effort to limit this patient's exposure and mitigate transmission in our community. All issues noted in this document were discussed and addressed.  A physical exam was not performed with this format.  I connected with Bradley Rice on 09/27/19 at 10:00 AM by telephone and verified that I am speaking with the correct person using two identifiers. Bradley Rice is currently located at home and no one  is currently with him  during visit. The provider, Evelina Dun, FNP is located in their office at time of visit.  I discussed the limitations, risks, security and privacy concerns of performing an evaluation and management service by telephone and the availability of in person appointments. I also discussed with the patient that there may be a patient responsible charge related to this service. The patient expressed understanding and agreed to proceed.   History and Present Illness:  HPI Pt calls the office today for a video visit for a prescription for a lift chair.   He reports he has chronic back pain and weakness. He has fallen multiple times. He reports he stays in an electric wheelchair most day, but is having trouble getting up from the couch.   He reports Monday he stood up from his couch and loss his balance and fell and "knocked out 3 teeth".   He is followed by the Pain Clinic monthly for chronic back pain. He is followed Pulmonologist for IPF. He is followed by Cardiologists for CAD.   He continues to smoke 1/2  Pack a day.    He reports he has been taking Ensure TID and has gained 6 lbs. He would like to continue this medication.     ROS   Observations/Objective: No SOB or distress noted, pt looks well   Assessment and  Plan: 1. Pulmonary fibrosis ([place])  2. Chronic bilateral low back pain, unspecified whether sciatica present  3. Current smoker - Nutritional Supplements (ENSURE HIGH PROTEIN) LIQD; Take 237 mLs by mouth 3 (three) times daily.  Dispense: 237 mL; Refill: 6  4. Weight loss - Nutritional Supplements (ENSURE HIGH PROTEIN) LIQD; Take 237 mLs by mouth 3 (three) times daily.  Dispense: 237 mL; Refill: 6  5. Lung nodule - Nutritional Supplements (ENSURE HIGH PROTEIN) LIQD; Take 237 mLs by mouth 3 (three) times daily.  Dispense: 237 mL; Refill: 6  6. Weakness  7. At risk for falls    I discussed the assessment and treatment plan with the patient. The patient was provided an opportunity to ask questions and all were answered. The patient agreed with the plan and demonstrated an understanding of the instructions.   The patient was advised to call back or seek an in-person evaluation if the symptoms worsen or if the condition fails to improve as anticipated.  The above assessment and management plan was discussed with the patient. The patient verbalized understanding of and has agreed to the management plan. Patient is aware to call the clinic if symptoms persist or worsen. Patient is aware when to return to the clinic for a follow-up visit. Patient educated on when it is appropriate to go to the emergency department.   Time call ended:  10:24 AM  I provided 24 minutes of non-face-to-face time during this encounter.    Evelina Dun,  FNP

## 2019-09-27 NOTE — Telephone Encounter (Signed)
Called [facility], rep provided new [employer] insurance information for patient.  Submitted a Prior Authorization request to [facility] for [name] via Cover My Meds. Will update once we receive a response.

## 2019-09-27 NOTE — Telephone Encounter (Signed)
Routing to pharm team to work on [employer] for [facility] thanks thanks

## 2019-09-27 NOTE — Telephone Encounter (Signed)
Received a paper request for OFEV script, called pharmacy. Pharmacy required prior authorization. Routing back to triage for prior authorization.

## 2019-09-30 DIAGNOSIS — J84112 Idiopathic pulmonary fibrosis: Secondary | ICD-10-CM | POA: Diagnosis not present

## 2019-09-30 DIAGNOSIS — J439 Emphysema, unspecified: Secondary | ICD-10-CM | POA: Diagnosis not present

## 2019-09-30 NOTE — Telephone Encounter (Signed)
Received notification from [facility] regarding a prior authorization for Bradley Rice. Authorization has been APPROVED from 09/27/19 to 09/26/20.   Authorization # [id number] Phone # [phone]  Called [facility] to advise, they will reach out to patient to schedule.Marland Kitchen  8:52 AM Beatriz Chancellor, CPhT

## 2019-10-03 ENCOUNTER — Other Ambulatory Visit: Payer: Self-pay | Admitting: Family

## 2019-10-08 ENCOUNTER — Telehealth: Payer: Self-pay | Admitting: Family

## 2019-10-08 DIAGNOSIS — F17200 Nicotine dependence, unspecified, uncomplicated: Secondary | ICD-10-CM

## 2019-10-08 DIAGNOSIS — R911 Solitary pulmonary nodule: Secondary | ICD-10-CM

## 2019-10-08 DIAGNOSIS — R634 Abnormal weight loss: Secondary | ICD-10-CM

## 2019-10-08 MED ORDER — ENSURE HIGH PROTEIN PO LIQD: 237.0000 mL | Freq: Three times a day (TID) | ORAL | 6 refills | Status: DC

## 2019-10-08 NOTE — Telephone Encounter (Signed)
Patient aware lift chair and ensure sent in.

## 2019-10-08 NOTE — Telephone Encounter (Signed)
Lmtcb.

## 2019-10-10 DIAGNOSIS — R634 Abnormal weight loss: Secondary | ICD-10-CM | POA: Diagnosis not present

## 2019-10-14 ENCOUNTER — Encounter: Payer: Self-pay | Admitting: Acute Care

## 2019-10-14 ENCOUNTER — Other Ambulatory Visit: Payer: Self-pay

## 2019-10-14 ENCOUNTER — Other Ambulatory Visit: Payer: Self-pay | Admitting: Acute Care

## 2019-10-14 DIAGNOSIS — Z87891 Personal history of nicotine dependence: Secondary | ICD-10-CM

## 2019-10-14 NOTE — Progress Notes (Signed)
Shared Decision-Making Visit for Lung Cancer Screening [phone] )  Lung Cancer Screening Criteria 54-69-years of age 57+ pack year smoking history>> 58 pack year smoking history No Recent History of coughing up blood   No Unexplained weight loss of > 15 pounds in the last 6 months. No Prior History Lung / other cancer  (Diagnosis within the last 5 years already requiring surveillance chest CT Scans). Pt is a current smoker, or former smoker who has quit within the last 15 years.  This patient meets the criterial noted above and is asymptomatic for any signs or symptoms of lung cancer.  The Shared Decision-Making Visit discussion included risks and benefits of screening, potential for follow up diagnostic testing for abnormal scans, potential for false positive tests, over diagnosis, and discussion about total radiation exposure. Patient stated willingness to undergo diagnostics and treatment as needed. Current smokers were counseled on smoking cessation as the single most powerful action they can take to decrease their risk of lung cancer, pulmonary disease, heart disease and stroke. They were given a resource card with information on receiving free nicotine replacement therapy , and information about free smoking cessation classes.  Pt understands that this scan is being paid for by a grant obtained by the [facility]. We discussed  that there is no guarantee of additional grant money from year to year as these grants are not guaranteed to programs on an annual basis.They have to be applied for and they are awarded based on county need, and utilization of the previous years funds..   Former smoker with a 58 pack year smoking history. Quit 2017>> [place]  Ticket #3 for the 10/14/2019 screening grant clinic  Pt. Understands that we will call him to schedule his CT Scan. He understands we will call him with his results once the scan has been completed and read.  Magdalen Spatz, MSN, AGACNP-BC [place] Pager # [phone] After 4 pm please call [phone]  10/14/2019 6:19 PM

## 2019-10-17 ENCOUNTER — Other Ambulatory Visit: Payer: Self-pay | Admitting: *Deleted

## 2019-10-17 DIAGNOSIS — Z87891 Personal history of nicotine dependence: Secondary | ICD-10-CM

## 2019-10-25 DIAGNOSIS — Z79899 Other long term (current) drug therapy: Secondary | ICD-10-CM | POA: Diagnosis not present

## 2019-10-31 ENCOUNTER — Ambulatory Visit: Payer: Medicaid Other | Attending: Internal Medicine

## 2019-10-31 DIAGNOSIS — Z23 Encounter for immunization: Secondary | ICD-10-CM

## 2019-10-31 DIAGNOSIS — J439 Emphysema, unspecified: Secondary | ICD-10-CM | POA: Diagnosis not present

## 2019-10-31 DIAGNOSIS — J84112 Idiopathic pulmonary fibrosis: Secondary | ICD-10-CM | POA: Diagnosis not present

## 2019-10-31 NOTE — Progress Notes (Signed)
   Covid-19 Vaccination Clinic  Name:  LEELYNN WHETSEL    MRN: [id number] DOB: 30-Aug-1960  10/31/2019  Mr. Boike was observed post Covid-19 immunization for 15 minutes without incident. He was provided with Vaccine Information Sheet and instruction to access the V-Safe system.   Mr. Normington was instructed to call 911 with any severe reactions post vaccine: Marland Kitchen Difficulty breathing  . Swelling of face and throat  . A fast heartbeat  . A bad rash all over body  . Dizziness and weakness   Immunizations Administered    Name Date Dose VIS Date Route   Moderna COVID-19 Vaccine 10/31/2019 10:06 AM 0.5 mL 07/02/2019 Intramuscular   Manufacturer: Moderna   Lot: [id number]   [place]: [id number]

## 2019-11-08 ENCOUNTER — Ambulatory Visit: Payer: Medicaid Other

## 2019-11-25 ENCOUNTER — Other Ambulatory Visit (HOSPITAL_COMMUNITY)
Admission: RE | Admit: 2019-11-25 | Discharge: 2019-11-25 | Disposition: A | Discharge status: Home / Self Care | Payer: Medicaid Other | Source: Ambulatory Visit | Attending: Internal Medicine | Admitting: Internal Medicine

## 2019-11-25 ENCOUNTER — Other Ambulatory Visit (HOSPITAL_COMMUNITY): Payer: Medicaid Other

## 2019-11-25 ENCOUNTER — Other Ambulatory Visit: Payer: Self-pay

## 2019-11-25 DIAGNOSIS — M5136 Other intervertebral disc degeneration, lumbar region: Secondary | ICD-10-CM | POA: Diagnosis not present

## 2019-11-25 DIAGNOSIS — M542 Cervicalgia: Secondary | ICD-10-CM | POA: Diagnosis not present

## 2019-11-25 DIAGNOSIS — G894 Chronic pain syndrome: Secondary | ICD-10-CM | POA: Diagnosis not present

## 2019-11-25 DIAGNOSIS — Z79899 Other long term (current) drug therapy: Secondary | ICD-10-CM | POA: Diagnosis not present

## 2019-11-26 ENCOUNTER — Other Ambulatory Visit: Payer: Self-pay

## 2019-11-26 ENCOUNTER — Ambulatory Visit
Admission: RE | Admit: 2019-11-26 | Discharge: 2019-11-26 | Disposition: A | Discharge status: Home / Self Care | Payer: No Typology Code available for payment source | Source: Ambulatory Visit | Attending: Acute Care | Admitting: Acute Care

## 2019-11-26 DIAGNOSIS — Z87891 Personal history of nicotine dependence: Secondary | ICD-10-CM

## 2019-11-27 ENCOUNTER — Telehealth: Payer: Self-pay | Admitting: Internal Medicine

## 2019-11-27 NOTE — Telephone Encounter (Signed)
Spoke with patient. He stated that he is scheduled for a PFT tomorrow at 4pm. He was scheduled for a COVID test on 4/26 but [facility] cancelled this test once someone told him that insurance would only cover 1 test and he would be charged $85 for a 2nd test. He was under the impression that they had called the office to let us know. I advised him that they did not.   While on the phone, he also stated that someone from the office told him that he would need 2 COVID tests for his appt. One for the breathing test, one for the OV. I advised him that is inaccurate and only one COVID test is needed.   He has requested to speak with a supervisor about this.  Lauren, please advise. Thanks!

## 2019-11-27 NOTE — Telephone Encounter (Signed)
Called and spoke with patient  Cancelled PFT for 11/28/19 due to insurance not paying for covid tests more than one within a 90 day period. Patient swabbed at [facility] medical in 09/2019 Rescheduled PFT and OV with MR for 12/31/19  covid test scheduled for 12/27/19 at [facility] Nothing further needed at this time

## 2019-11-28 ENCOUNTER — Ambulatory Visit: Payer: Medicaid Other | Admitting: Internal Medicine

## 2019-11-28 NOTE — Progress Notes (Signed)
Please call patient and let them  know their  low dose Ct was read as a Lung RADS 2: nodules that are benign in appearance and behavior with a very low likelihood of becoming a clinically active cancer due to size or lack of growth. Recommendation per radiology is for a repeat LDCT in 12 months. .Please let them  know we will order and schedule their  annual screening scan for 10/2020. Please let them  know there was notation of CAD on their  scan.  Please remind the patient  that this is a non-gated exam therefore degree or severity of disease  cannot be determined. Please have them  follow up with their PCP regarding potential risk factor modification, dietary therapy or pharmacologic therapy if clinically indicated. Pt.  is not  currently on statin therapy. Please place order for annual  screening scan for  10/2020 and fax results to PCP. Thanks so much.  There was notation of pulmonary fibrosis, but the patient is already followed by Dr. Chase Caller.  Make sure he is aware of the cardiac findings. He is not on statins.Thanks so much

## 2019-11-30 DIAGNOSIS — J439 Emphysema, unspecified: Secondary | ICD-10-CM | POA: Diagnosis not present

## 2019-11-30 DIAGNOSIS — J84112 Idiopathic pulmonary fibrosis: Secondary | ICD-10-CM | POA: Diagnosis not present

## 2019-12-03 ENCOUNTER — Ambulatory Visit: Payer: No Typology Code available for payment source | Attending: Internal Medicine

## 2019-12-03 ENCOUNTER — Telehealth: Payer: Self-pay | Admitting: Internal Medicine

## 2019-12-03 DIAGNOSIS — Z23 Encounter for immunization: Secondary | ICD-10-CM

## 2019-12-03 NOTE — Telephone Encounter (Signed)
I have only seen headache with antifibrotic's in one of the patient.  That particular patient had it for both Esbriet and nintedanib.  Plan -Stop nintedanib until further notice -Please schedule 1 week follow-up via telephone with myuself of an app  - if headache persists > 48h or gets worse anytime or he gets double vision or any symptom of concern- needs to go to ER or talk to PCP Sharion Balloon, FNP

## 2019-12-03 NOTE — Progress Notes (Signed)
   Covid-19 Vaccination Clinic  Name:  Bradley Rice    MRN: [id number] DOB: 1960/12/15  12/03/2019  Mr. [name] was observed post Covid-19 immunization for 15 minutes without incident. He was provided with Vaccine Information Sheet and instruction to access the V-Safe system.   Mr. [name] was instructed to call 911 with any severe reactions post vaccine: Marland Kitchen Difficulty breathing  . Swelling of face and throat  . A fast heartbeat  . A bad rash all over body  . Dizziness and weakness   Immunizations Administered    Name Date Dose VIS Date Route   Moderna COVID-19 Vaccine 12/03/2019  9:39 AM 0.5 mL 07/2019 Intramuscular   Manufacturer: Moderna   Lot: [id number]   [place]: [id number]

## 2019-12-03 NOTE — Telephone Encounter (Signed)
LMTCB x 1 

## 2019-12-03 NOTE — Telephone Encounter (Signed)
Spoke with pt. He reports that he started taking Ofev last week and for the last 4 days he has had a constant headache with sharp pains in the top of his head when he shakes head from side to side. Pt has tried Ibuprofen, Tylenol and otc headache relief medicine without relief. Pt denies h/o HBP. Please advise.

## 2019-12-04 ENCOUNTER — Telehealth: Payer: Self-pay | Admitting: Internal Medicine

## 2019-12-04 DIAGNOSIS — R634 Abnormal weight loss: Secondary | ICD-10-CM | POA: Diagnosis not present

## 2019-12-04 NOTE — Telephone Encounter (Signed)
Attempted to call Lucrezia Starch educator but unable to reach. Left her a detailed message that we are reaching out to pt in regards to the [place].  Called and spoke with pt letting him know the info stated by MR and pt verbalized understanding. Stated to pt that we will schedule a televisit in 1 week. appt scheduled with Aaron Edelman 5/11 at 10am. Nothing further needed.

## 2019-12-04 NOTE — Telephone Encounter (Signed)
Lucrezia Starch educator states patient has been having headaches while on Ofev. Also [place] and stomach pains. Would like for [place] to call the patient. Langley Gauss phone is [phone].

## 2019-12-04 NOTE — Telephone Encounter (Signed)
Duplicate message. Will close this encounter.

## 2019-12-10 ENCOUNTER — Ambulatory Visit (INDEPENDENT_AMBULATORY_CARE_PROVIDER_SITE_OTHER): Payer: Medicaid Other | Admitting: Pulmonary Disease

## 2019-12-10 ENCOUNTER — Encounter: Payer: Self-pay | Admitting: Pulmonary Disease

## 2019-12-10 ENCOUNTER — Other Ambulatory Visit: Payer: Self-pay

## 2019-12-10 DIAGNOSIS — R519 Headache, unspecified: Secondary | ICD-10-CM | POA: Insufficient documentation

## 2019-12-10 DIAGNOSIS — J439 Emphysema, unspecified: Secondary | ICD-10-CM | POA: Insufficient documentation

## 2019-12-10 DIAGNOSIS — J84112 Idiopathic pulmonary fibrosis: Secondary | ICD-10-CM | POA: Diagnosis not present

## 2019-12-10 NOTE — Assessment & Plan Note (Signed)
New onset acute headache after starting Ofev 2 weeks and Unsure if this is directly related to antifibrotic use Patient stopped Ofev 72 hours ago and symptoms have persisted Patient reports that this is the worst headache that he has ever had He reports that it is pulsating Has not followed up with primary care or emergency room for further evaluation  Plan: Routing patient to emergency room today Encourage patient to follow-up with primary care accordingly we will route note to primary care today

## 2019-12-10 NOTE — Progress Notes (Signed)
Virtual Visit via Telephone Note  I connected with@ on 12/10/19 at 10:00 AM EDT by telephone and verified that I am speaking with the correct person using two identifiers.  Location: Patient: Home Provider: Office [facility] - [address], [place], [place], [place] [address]   I discussed the limitations, risks, security and privacy concerns of performing an evaluation and management service by telephone and the availability of in person appointments. I also discussed with the patient that there may be a patient responsible charge related to this service. The patient expressed understanding and agreed to proceed.  Patient consented to consult via telephone: Yes People present and their role in pt care: Pt     History of Present Illness:  59 year old male former smoker followed in our office for interstitial lung disease  Past medical history: Multilevel spondylosis, TIA, postlaminectomy syndrome, anxiety, depression, hypertension, chronic pain, ALS Smoking history: Former smoker Maintenance: Community education officer, Symbicort 160 Patient of Dr. Chase Caller  Chief complaint:    59 year old male former smoker followed in our office for interstitial lung disease/IPF.  Patient was last seen in February/2021 by Dr. Chase Caller.  At that time it was felt that his IPF was stable.  Patient was having significant nausea and vomiting with weight loss with Esbriet.  He stopped Esbriet 1 month ago.  Patient was started on Ofev.  Patient also coordinated follow-up with our pharmacy team.  Patient was encouraged to remain on Spiriva and Symbicort.  It was also requested the patient have a face-to-face office visit in April/2021 in an ILD clinic slot.  Our office was contacted by the Ofev nurse educator on 12/03/2019 reporting the patient was having increased headaches, nausea and stomach pains and requesting recommendations.  At that time Dr. Chase Caller recommended stopping Ofev.  A 1 week follow-up  via telephone with Dr. Chase Caller or an APP.  He was instructed that if headaches persist for greater than 48 hours to seek emergent care or follow-up with PCP.  Reach the patient by telephone today.  He reports that he just stopped his Ofev 3 days ago.  He has continued to have severe persistent headaches.  He reports that they are so debilitating that is bringing tears to his eyes.  He also feels that the headache is pulsating.  He has never had headaches like these before.  He has not followed up with primary care.  He has not followed up with the emergency room.  We will discuss this today.   Observations/Objective:  Social History   Tobacco Use  Smoking Status Former Smoker  . Packs/day: 1.00  . Years: 30.00  . Pack years: 30.00  . Types: Cigarettes  . Quit date: 10/2015  . Years since quitting: 4.1  Smokeless Tobacco Never Used   Immunization History  Administered Date(s) Administered  . Influenza Split 06/26/2012  . Influenza,inj,Quad PF,6+ Mos 08/24/2015, 08/25/2016, 04/30/2018  . Influenza-Unspecified 06/26/2012, 08/24/2015, 08/25/2016, 05/21/2019  . Moderna SARS-COVID-2 Vaccination 10/31/2019, 12/03/2019  . Pneumococcal Polysaccharide-23 06/25/2019  . Tdap 06/01/2013      Assessment and Plan:  IPF (idiopathic pulmonary fibrosis) ([place]) Encourage patient to remain off of Ofev  Routing patient to emergency room for further evaluation of severe persistent new onset headaches  Headache New onset acute headache after starting Ofev 2 weeks and Unsure if this is directly related to antifibrotic use Patient stopped Ofev 72 hours ago and symptoms have persisted Patient reports that this is the worst headache that he has ever had  He reports that it is pulsating Has not followed up with primary care or emergency room for further evaluation  Plan: Routing patient to emergency room today Encourage patient to follow-up with primary care accordingly we will route note to  primary care today   Emphysema lung ([place]) Plan: Continue not smoke Continue Symbicort 160 Continue Spiriva HandiHaler   Follow Up Instructions:  Return in about 2 weeks (around 12/24/2019), or if symptoms worsen or fail to improve, for Follow up with Dr. Purnell Shoemaker, Follow up with Wyn Quaker FNP-C, ILD clinic - 44min slot.   I discussed the assessment and treatment plan with the patient. The patient was provided an opportunity to ask questions and all were answered. The patient agreed with the plan and demonstrated an understanding of the instructions.   The patient was advised to call back or seek an in-person evaluation if the symptoms worsen or if the condition fails to improve as anticipated.  I provided 23 minutes of non-face-to-face time during this encounter.   Lauraine Rinne, NP

## 2019-12-10 NOTE — Assessment & Plan Note (Signed)
Plan: Continue not smoke Continue Symbicort 160 Continue Spiriva HandiHaler

## 2019-12-10 NOTE — Assessment & Plan Note (Signed)
Encourage patient to remain off of Ofev  Routing patient to emergency room for further evaluation of severe persistent new onset headaches

## 2019-12-10 NOTE — Patient Instructions (Addendum)
You were seen today by Lauraine Rinne, NP  for:   1. IPF (idiopathic pulmonary fibrosis) ([place])  Continue to hold Ofev  1 to 2-week follow-up with our office  2. Acute intractable headache, unspecified headache type  Please present to the emergency room today immediately for further evaluation  Please follow-up with primary care  Follow Up:    Return in about 2 weeks (around 12/24/2019), or if symptoms worsen or fail to improve, for Follow up with Dr. Purnell Shoemaker, Follow up with Wyn Quaker FNP-C, ILD clinic - 45min slot.   Please do your part to reduce the spread of COVID-19:      Reduce your risk of any infection  and COVID19 by using the similar precautions used for avoiding the common cold or flu:  Marland Kitchen Wash your hands often with soap and warm water for at least 20 seconds.  If soap and water are not readily available, use an alcohol-based hand sanitizer with at least 60% alcohol.  . If coughing or sneezing, cover your mouth and nose by coughing or sneezing into the elbow areas of your shirt or coat, into a tissue or into your sleeve (not your hands). Langley Gauss A MASK when in public  . Avoid shaking hands with others and consider head nods or verbal greetings only. . Avoid touching your eyes, nose, or mouth with unwashed hands.  . Avoid close contact with people who are sick. . Avoid places or events with large numbers of people in one location, like concerts or sporting events. . If you have some symptoms but not all symptoms, continue to monitor at home and seek medical attention if your symptoms worsen. . If you are having a medical emergency, call 911.   [place] / e-Visit: [email or web address]         [facility]: [facility]: [phone]                   [facility]: [phone]     It is flu season:   >>> Best ways  to protect herself from the flu: Receive the yearly flu vaccine, practice good hand hygiene washing with soap and also using hand sanitizer when available, eat a nutritious meals, get adequate rest, hydrate appropriately   Please contact the office if your symptoms worsen or you have concerns that you are not improving.   Thank you for choosing [facility] for your healthcare, and for allowing [place] to partner with you on your healthcare journey. I am thankful to be able to provide care to you today.   Wyn Quaker FNP-C

## 2019-12-15 ENCOUNTER — Encounter (HOSPITAL_COMMUNITY): Payer: Self-pay | Admitting: Emergency Medicine

## 2019-12-15 ENCOUNTER — Emergency Department (HOSPITAL_COMMUNITY): Payer: Medicaid Other

## 2019-12-15 ENCOUNTER — Emergency Department (HOSPITAL_COMMUNITY)
Admission: EM | Admit: 2019-12-15 | Discharge: 2019-12-16 | Disposition: A | Discharge status: Home / Self Care | Payer: Medicaid Other | Attending: Emergency Medicine | Admitting: Emergency Medicine

## 2019-12-15 ENCOUNTER — Other Ambulatory Visit: Payer: Self-pay

## 2019-12-15 DIAGNOSIS — G1221 Amyotrophic lateral sclerosis: Secondary | ICD-10-CM | POA: Diagnosis not present

## 2019-12-15 DIAGNOSIS — R519 Headache, unspecified: Secondary | ICD-10-CM | POA: Insufficient documentation

## 2019-12-15 DIAGNOSIS — Z87891 Personal history of nicotine dependence: Secondary | ICD-10-CM | POA: Diagnosis not present

## 2019-12-15 DIAGNOSIS — I2510 Atherosclerotic heart disease of native coronary artery without angina pectoris: Secondary | ICD-10-CM | POA: Diagnosis not present

## 2019-12-15 DIAGNOSIS — Z79899 Other long term (current) drug therapy: Secondary | ICD-10-CM | POA: Diagnosis not present

## 2019-12-15 DIAGNOSIS — Z7982 Long term (current) use of aspirin: Secondary | ICD-10-CM | POA: Insufficient documentation

## 2019-12-15 DIAGNOSIS — J449 Chronic obstructive pulmonary disease, unspecified: Secondary | ICD-10-CM | POA: Insufficient documentation

## 2019-12-15 LAB — CBC WITH DIFFERENTIAL/PLATELET
Abs Immature Granulocytes: 0.11 10*3/uL — ABNORMAL HIGH (ref 0.00–0.07)
Basophils Absolute: 0.2 10*3/uL — ABNORMAL HIGH (ref 0.0–0.1)
Basophils Relative: 1 %
Eosinophils Absolute: 0.5 10*3/uL (ref 0.0–0.5)
Eosinophils Relative: 3 %
HCT: 45.6 % (ref 39.0–52.0)
Hemoglobin: 14.7 g/dL (ref 13.0–17.0)
Immature Granulocytes: 1 %
Lymphocytes Relative: 22 %
Lymphs Abs: 3.7 10*3/uL (ref 0.7–4.0)
MCH: 31.3 pg (ref 26.0–34.0)
MCHC: 32.2 g/dL (ref 30.0–36.0)
MCV: 97 fL (ref 80.0–100.0)
Monocytes Absolute: 1.1 10*3/uL — ABNORMAL HIGH (ref 0.1–1.0)
Monocytes Relative: 6 %
Neutro Abs: 11.5 10*3/uL — ABNORMAL HIGH (ref 1.7–7.7)
Neutrophils Relative %: 67 %
Platelets: 316 10*3/uL (ref 150–400)
RBC: 4.7 MIL/uL (ref 4.22–5.81)
RDW: 13.2 % (ref 11.5–15.5)
WBC: 17 10*3/uL — ABNORMAL HIGH (ref 4.0–10.5)
nRBC: 0 % (ref 0.0–0.2)

## 2019-12-15 MED ORDER — IOHEXOL 350 MG/ML SOLN
100.0000 mL | Freq: Once | INTRAVENOUS | Status: AC | PRN
  Administered 2019-12-16: 100 mL via INTRAVENOUS

## 2019-12-15 NOTE — ED Triage Notes (Signed)
Med changed by his doctor at [facility] several weeks ago   Headache for the last 2 weeks   Called the doctor who encouraged pt to come here to be seen   "I've been talking to him all along" since medication was chenged

## 2019-12-15 NOTE — ED Triage Notes (Signed)
Pt has ALS and reports that his headache is to the top of his head  Describes as sharp and states lights make the pain worse

## 2019-12-16 DIAGNOSIS — R519 Headache, unspecified: Secondary | ICD-10-CM | POA: Diagnosis not present

## 2019-12-16 LAB — COMPREHENSIVE METABOLIC PANEL
ALT: 11 U/L (ref 0–44)
AST: 13 U/L — ABNORMAL LOW (ref 15–41)
Albumin: 3.7 g/dL (ref 3.5–5.0)
Alkaline Phosphatase: 68 U/L (ref 38–126)
Anion gap: 7 (ref 5–15)
BUN: 17 mg/dL (ref 6–20)
CO2: 27 mmol/L (ref 22–32)
Calcium: 8.7 mg/dL — ABNORMAL LOW (ref 8.9–10.3)
Chloride: 100 mmol/L (ref 98–111)
Creatinine, Ser: 0.79 mg/dL (ref 0.61–1.24)
GFR calc Af Amer: >60 mL/min (ref >60)
GFR calc non Af Amer: >60 mL/min (ref >60)
Glucose, Bld: 105 mg/dL — ABNORMAL HIGH (ref 70–99)
Potassium: 4.1 mmol/L (ref 3.5–5.1)
Sodium: 134 mmol/L — ABNORMAL LOW (ref 135–145)
Total Bilirubin: 0.5 mg/dL (ref 0.3–1.2)
Total Protein: 7.5 g/dL (ref 6.5–8.1)

## 2019-12-16 MED ORDER — MAGNESIUM SULFATE 2 GM/50ML IV SOLN
2.0000 g | Freq: Once | INTRAVENOUS | Status: AC
  Administered 2019-12-16: 2 g via INTRAVENOUS
  Filled 2019-12-16: qty 50

## 2019-12-16 MED ORDER — DIPHENHYDRAMINE HCL 50 MG/ML IJ SOLN
25.0000 mg | Freq: Once | INTRAMUSCULAR | Status: AC
  Administered 2019-12-16: 25 mg via INTRAVENOUS
  Filled 2019-12-16: qty 1

## 2019-12-16 MED ORDER — KETOROLAC TROMETHAMINE 30 MG/ML IJ SOLN
30.0000 mg | Freq: Once | INTRAMUSCULAR | Status: AC
  Administered 2019-12-16: 30 mg via INTRAVENOUS
  Filled 2019-12-16: qty 1

## 2019-12-16 MED ORDER — PROCHLORPERAZINE EDISYLATE 10 MG/2ML IJ SOLN
10.0000 mg | Freq: Once | INTRAMUSCULAR | Status: AC
  Administered 2019-12-16: 10 mg via INTRAVENOUS
  Filled 2019-12-16: qty 2

## 2019-12-16 MED ORDER — DEXAMETHASONE SODIUM PHOSPHATE 10 MG/ML IJ SOLN
10.0000 mg | Freq: Once | INTRAMUSCULAR | Status: AC
  Administered 2019-12-16: 10 mg via INTRAVENOUS
  Filled 2019-12-16: qty 1

## 2019-12-16 MED ORDER — LACTATED RINGERS IV BOLUS
1000.0000 mL | Freq: Once | INTRAVENOUS | Status: AC
  Administered 2019-12-16: 1000 mL via INTRAVENOUS

## 2019-12-16 NOTE — ED Provider Notes (Signed)
[facility] EMERGENCY DEPARTMENT Provider Note   CSN: [id number] Arrival date & time: 12/15/19  2224     History No chief complaint on file.   Bradley Rice is a 59 y.o. male.  Patient here with history of lung cancer, ALS and IPF.  Has had a headache for 2 weeks.  Seems to be worse in the evenings.  Each day it seems to be worse in the day previously.  No trauma.  No fevers or recent illnesses.  He can drink okay.  No nausea or vomiting.  Made worse by light and movement.  Also has a tender spot right on the top of his head.    Past Medical History:  Diagnosis Date  . ALS (amyotrophic lateral sclerosis) ([place])   . Asthma   . Back pain   . COPD (chronic obstructive pulmonary disease) ([place])   . Coronary artery calcification seen on CT scan   . DDD (degenerative disc disease), lumbar   . Depression   . Dysphagia    Following previous surgery  . History of blood transfusion 2014   Post-op bleed  . History of lung cancer   . History of stroke 2015  . Idiopathic pulmonary fibrosis ([place])   . Migraine   . Spider bite 2012    Patient Active Problem List   Diagnosis Date Noted  . Headache 12/10/2019  . Emphysema lung ([place]) 12/10/2019  . Bronchitis 08/16/2018  . Apnea 07/15/2018  . IPF (idiopathic pulmonary fibrosis) ([place]) 07/15/2018  . Pulmonary nodules 07/15/2018  . Chronic back pain 04/30/2018  . Chronic pain 03/15/2018  . Hypertension 09/30/2016  . ALS (amyotrophic lateral sclerosis) ([place])   . GAD (generalized anxiety disorder) 02/17/2015  . Depression 02/17/2015  . Current smoker 02/17/2015  . Postlaminectomy syndrome, cervical region 08/15/2014  . Cervical dystonia 06/13/2014  . Cervicogenic headache 05/09/2014  . TIA (transient ischemic attack) 12/28/2013  . Cervical spondylosis with radiculopathy 12/17/2013  . ED (erectile dysfunction) 11/21/2013  . Multilevel spondylosis 06/25/2012  . 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 Carpal tunnel syndrome of left wrist  06/25/2012    Class: Chronic    Past Surgical History:  Procedure Laterality Date  . ANTERIOR CERVICAL DECOMP/DISCECTOMY FUSION  12/22/2011   Procedure: ANTERIOR CERVICAL DECOMPRESSION/DISCECTOMY FUSION 3 LEVELS;  Surgeon: Melina Schools, MD;  Location: [place];  Service: Orthopedics;  Laterality: N/A;  ACDF C5-T1  . ANTERIOR CERVICAL DECOMP/DISCECTOMY FUSION  06/25/2012   Procedure: ANTERIOR CERVICAL DECOMPRESSION/DISCECTOMY FUSION 1 LEVEL/HARDWARE REMOVAL;  Surgeon: [name], MD;  Location: [place];  Service: Orthopedics;  Laterality: N/A;  Removal anterior cervical plate C5-T1, Explore Fusion, Left C5-6, C6-7, C7-T1 Re-do Foraminotomy, Left open carpal tunnel release with release of ulnar nerve at [facility], Posterior Cervical Fusion with lateral mass screw  . BACK SURGERY    . CARDIAC CATHETERIZATION    . CARPAL TUNNEL RELEASE  06/25/2012   Procedure: CARPAL TUNNEL RELEASE;  Surgeon: [name], MD;  Location: [place];  Service: Orthopedics;  Laterality: Left;  as above  . CHOLECYSTECTOMY  2009  . CORONARY ANGIOPLASTY  2001   [facility]  . LUMBAR FUSION  2000  . POSTERIOR CERVICAL FUSION/FORAMINOTOMY  06/25/2012   Procedure: POSTERIOR CERVICAL FUSION/FORAMINOTOMY LEVEL 1;  Surgeon: [name], MD;  Location: [place];  Service: Orthopedics;  Laterality: N/A;  as above  . POSTERIOR CERVICAL FUSION/FORAMINOTOMY N/A 12/17/2013   Procedure: REMOVAL OF POSTERIOR CERVICAL FUSION LATERAL MASS SCREWS AND RODS C5-T1, EXPLORE  FUSION, LEFT SIX-SEVEN FORAMINOTOMY;  Surgeon: [name], MD;  Location: [place];  Service: Orthopedics;  Laterality: N/A;  . VASECTOMY  1990       Family History  Problem Relation Age of Onset  . ALS Mother   . Cancer Mother   . Cancer Sister   . ALS Sister   . Cancer Brother        1 brother  . Anesthesia problems Neg Hx     Social History   Tobacco Use  . Smoking status: Former Smoker    Packs/day: 1.00    Years: 30.00    Pack years: 30.00     Types: Cigarettes    Quit date: 10/2015    Years since quitting: 4.1  . Smokeless tobacco: Never Used  Substance Use Topics  . Alcohol use: No    Alcohol/week: 0.0 standard drinks  . Drug use: No    Home Medications Prior to Admission medications   Medication Sig Start Date End Date Taking? Authorizing Provider  albuterol (PROVENTIL HFA;VENTOLIN HFA) 108 (90 Base) MCG/ACT inhaler Inhale 2 puffs into the lungs every 6 (six) hours as needed for wheezing or shortness of breath. 11/17/16   Evelina Dun A, FNP  albuterol (PROVENTIL) (2.5 MG/3ML) 0.083% nebulizer solution Take 3 mLs (2.5 mg total) by nebulization every 6 (six) hours as needed for wheezing or shortness of breath. 10/17/16   Evelina Dun A, FNP  aspirin EC 81 MG tablet Take 81 mg by mouth daily.    [provider]  budesonide-formoterol (SYMBICORT) 160-4.5 MCG/ACT inhaler Inhale 2 puffs into the lungs 2 (two) times daily. 10/23/18   Brand Males, MD  methocarbamol (ROBAXIN) 750 MG tablet Take 1 tablet (750 mg total) by mouth 2 (two) times daily as needed. 05/17/18   Evelina Dun A, FNP  metoprolol succinate (TOPROL-XL) 50 MG 24 hr tablet Take 1 tablet (50 mg total) by mouth daily. 10/11/17   Hawks, Alyse Low A, FNP  Nintedanib (OFEV) 150 MG CAPS Take 1 capsule (150 mg total) by mouth 2 (two) times daily. 09/27/19   Brand Males, MD  Nutritional Supplements (ENSURE HIGH PROTEIN) LIQD Take 237 mLs by mouth 3 (three) times daily. 10/08/19   Evelina Dun A, FNP  ondansetron (ZOFRAN) 4 MG tablet Take 1 tablet (4 mg total) by mouth 3 (three) times daily. 06/25/19   Brand Males, MD  oxyCODONE (ROXICODONE) 15 MG immediate release tablet Take 15 mg by mouth 4 (four) times daily. 06/22/18   [provider]  sildenafil (REVATIO) 20 MG tablet Take 2-5 tabs PRN prior to sexual activity. 08/25/16   [provider]  tiotropium (SPIRIVA) 18 MCG inhalation capsule Place 1 capsule (18 mcg total) into inhaler  and inhale daily. 10/23/18   Brand Males, MD    Allergies    Pirfenidone  Review of Systems   Review of Systems  All other systems reviewed and are negative.   Physical Exam Updated Vital Signs BP 120/72 (BP Location: Right Arm)   Pulse 76   Temp 98.3 F (36.8 C) (Oral)   Resp 16   Ht 6' (1.829 m)   Wt 71.2 kg   SpO2 100%   BMI 21.29 kg/m   Physical Exam Vitals and nursing note reviewed.  Constitutional:      Appearance: He is well-developed.  HENT:     Head: Normocephalic and atraumatic.  Eyes:     Conjunctiva/sclera: Conjunctivae normal.  Cardiovascular:     Rate and Rhythm: Normal  rate.  Pulmonary:     Effort: Pulmonary effort is normal. No respiratory distress.  Abdominal:     General: There is no distension.  Musculoskeletal:        General: No swelling or tenderness. Normal range of motion.     Cervical back: Normal range of motion.  Skin:    General: Skin is warm and dry.  Neurological:     General: No focal deficit present.     Mental Status: He is alert.     Comments: No altered mental status, able to give full seemingly accurate history.  Face is symmetric, EOM's intact, pupils equal and reactive, vision intact, tongue and uvula midline without deviation. Upper and Lower extremity motor 5/5, intact pain perception in distal extremities, 2+ reflexes in biceps, patella and achilles tendons. Able to perform finger to nose normal with both hands. Walks without assistance or evident ataxia.      ED Results / Procedures / Treatments   Labs (all labs ordered are listed, but only abnormal results are displayed) Labs Reviewed  CBC WITH DIFFERENTIAL/PLATELET - Abnormal; Notable for the following components:      Result Value   WBC 17.0 (*)    Neutro Abs 11.5 (*)    Monocytes Absolute 1.1 (*)    Basophils Absolute 0.2 (*)    Abs Immature Granulocytes 0.11 (*)    All other components within normal limits  COMPREHENSIVE METABOLIC PANEL - Abnormal;  Notable for the following components:   Sodium 134 (*)    Glucose, Bld 105 (*)    Calcium 8.7 (*)    AST 13 (*)    All other components within normal limits    EKG None  Radiology CT Angio Head W or Wo Contrast  Result Date: 12/16/2019 CLINICAL DATA:  Acute headache. ALS. EXAM: CT ANGIOGRAPHY HEAD TECHNIQUE: Multidetector CT imaging of the head was performed using the standard protocol during bolus administration of intravenous contrast. Multiplanar CT image reconstructions and MIPs were obtained to evaluate the vascular anatomy. CONTRAST:  133mL OMNIPAQUE IOHEXOL 350 MG/ML SOLN COMPARISON:  None. FINDINGS: CT HEAD Brain: There is no mass, hemorrhage or extra-axial collection. The size and configuration of the ventricles and extra-axial CSF spaces are normal. The brain parenchyma is normal, without acute or chronic infarction. Vascular: No abnormal hyperdensity of the major intracranial arteries or dural venous sinuses. No intracranial atherosclerosis. Skull: The visualized skull base, calvarium and extracranial soft tissues are normal. Sinuses/Orbits: No fluid levels or advanced mucosal thickening of the visualized paranasal sinuses. No mastoid or middle ear effusion. The orbits are normal. CTA HEAD POSTERIOR CIRCULATION: --Vertebral arteries: Normal V4 segments. --Inferior cerebellar arteries: Normal. --Basilar artery: Normal. --Superior cerebellar arteries: Normal. --Posterior cerebral arteries: Normal. ANTERIOR CIRCULATION: --Intracranial internal carotid arteries: Normal. --Anterior cerebral arteries (ACA): Normal. Both A1 segments are present. Patent anterior communicating artery (a-comm). --Middle cerebral arteries (MCA): Normal. Venous sinuses: As permitted by contrast timing, patent. Anatomic variants: Left P-comm IMPRESSION: Normal CTA of the head. Electronically Signed   By: Ulyses Jarred M.D.   On: 12/16/2019 00:48    Procedures Procedures (including critical care time)  Medications  Ordered in ED Medications  iohexol (OMNIPAQUE) 350 MG/ML injection 100 mL (100 mLs Intravenous Contrast Given 12/16/19 0027)  prochlorperazine (COMPAZINE) injection 10 mg (10 mg Intravenous Given 12/16/19 0020)  diphenhydrAMINE (BENADRYL) injection 25 mg (25 mg Intravenous Given 12/16/19 0020)  dexamethasone (DECADRON) injection 10 mg (10 mg Intravenous Given 12/16/19 0019)  lactated ringers bolus 1,000  mL (0 mLs Intravenous Stopped 12/16/19 0230)  magnesium sulfate IVPB 2 g 50 mL (0 g Intravenous Stopped 12/16/19 0329)  ketorolac (TORADOL) 30 MG/ML injection 30 mg (30 mg Intravenous Given 12/16/19 0238)    ED Course  I have reviewed the triage vital signs and the nursing notes.  Pertinent labs & imaging results that were available during my care of the patient were reviewed by me and considered in my medical decision making (see chart for details).    MDM Rules/Calculators/A&P                      CT scan done to evaluate for any metastatic lesions from his history of lung cancer.  Once that was negative treat his headache as a migraine which ended in significant improvement.  He will follow-up with neurology as an outpatient for further migraine management.  Return here for any new or worsening symptoms.  Low suspicion for any emergent causes such as meningitis, subarachnoid hemorrhage. Final Clinical Impression(s) / ED Diagnoses Final diagnoses:  Nonintractable headache, unspecified chronicity pattern, unspecified headache type    Rx / DC Orders ED Discharge Orders    None       Karielle Davidow, Corene Cornea, MD 12/16/19 [phone]

## 2019-12-25 DIAGNOSIS — M542 Cervicalgia: Secondary | ICD-10-CM | POA: Diagnosis not present

## 2019-12-25 DIAGNOSIS — G894 Chronic pain syndrome: Secondary | ICD-10-CM | POA: Diagnosis not present

## 2019-12-25 DIAGNOSIS — M5136 Other intervertebral disc degeneration, lumbar region: Secondary | ICD-10-CM | POA: Diagnosis not present

## 2019-12-25 DIAGNOSIS — Z79899 Other long term (current) drug therapy: Secondary | ICD-10-CM | POA: Diagnosis not present

## 2019-12-27 ENCOUNTER — Other Ambulatory Visit: Payer: Self-pay

## 2019-12-27 ENCOUNTER — Other Ambulatory Visit (HOSPITAL_COMMUNITY): Payer: No Typology Code available for payment source

## 2019-12-27 ENCOUNTER — Other Ambulatory Visit (HOSPITAL_COMMUNITY)
Admission: RE | Admit: 2019-12-27 | Discharge: 2019-12-27 | Disposition: A | Discharge status: Home / Self Care | Payer: Medicaid Other | Source: Ambulatory Visit | Attending: Internal Medicine | Admitting: Internal Medicine

## 2019-12-27 DIAGNOSIS — Z20822 Contact with and (suspected) exposure to covid-19: Secondary | ICD-10-CM | POA: Insufficient documentation

## 2019-12-27 DIAGNOSIS — Z01812 Encounter for preprocedural laboratory examination: Secondary | ICD-10-CM | POA: Insufficient documentation

## 2019-12-28 LAB — SARS CORONAVIRUS 2 (TAT 6-24 HRS): SARS Coronavirus 2: NEGATIVE

## 2019-12-31 ENCOUNTER — Ambulatory Visit (INDEPENDENT_AMBULATORY_CARE_PROVIDER_SITE_OTHER): Payer: Medicaid Other | Admitting: Internal Medicine

## 2019-12-31 ENCOUNTER — Encounter: Payer: Self-pay | Admitting: Internal Medicine

## 2019-12-31 ENCOUNTER — Other Ambulatory Visit: Payer: Self-pay | Admitting: *Deleted

## 2019-12-31 ENCOUNTER — Other Ambulatory Visit: Payer: Self-pay

## 2019-12-31 VITALS — BP 110/70 | HR 79 | Temp 98.1°F | Ht 72.0 in | Wt 150.0 lb

## 2019-12-31 DIAGNOSIS — J84112 Idiopathic pulmonary fibrosis: Secondary | ICD-10-CM

## 2019-12-31 DIAGNOSIS — G44229 Chronic tension-type headache, not intractable: Secondary | ICD-10-CM

## 2019-12-31 DIAGNOSIS — R05 Cough: Secondary | ICD-10-CM | POA: Diagnosis not present

## 2019-12-31 DIAGNOSIS — J439 Emphysema, unspecified: Secondary | ICD-10-CM | POA: Diagnosis not present

## 2019-12-31 DIAGNOSIS — R911 Solitary pulmonary nodule: Secondary | ICD-10-CM | POA: Diagnosis not present

## 2019-12-31 LAB — PULMONARY FUNCTION TEST
DL/VA % pred: 77 %
DL/VA: 3.3 ml/min/mmHg/L
DLCO unc % pred: 62 %
DLCO unc: 18.33 ml/min/mmHg
FEF 25-75 Pre: 4.36 L/sec
FEF2575-%Pred-Pre: 133 %
FEV1-%Pred-Pre: 70 %
FEV1-Pre: 2.74 L
FEV1FVC-%Pred-Pre: 114 %
FEV6-%Pred-Pre: 64 %
FEV6-Pre: 3.15 L
FEV6FVC-%Pred-Pre: 104 %
FVC-%Pred-Pre: 61 %
FVC-Pre: 3.15 L
Pre FEV1/FVC ratio: 87 %
Pre FEV6/FVC Ratio: 100 %

## 2019-12-31 NOTE — Progress Notes (Signed)
OV Nov 2017 History of Present Illness Bradley Rice is a 59 y.o. male for evaluation of sleep problems.  He has noticed trouble falling asleep and staying asleep.  He feels some of this is related to neck and back pain.  His wife has told him that he snores, and will stop breathing while asleep.  He has a hard time slowing things down when he is trying to sleep.  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  He reports not being seen by a neurologist for past 2 years.  He also reports history of non small cell lung cancer from about 2 years ago.  He was told the ALS therapy and cancer therapy couldn't be given together, so he stopped both therapies.  He reports seeing a lung doctor about 2 years ago, but was only told that he had lung cancer but no other lung disease that he is aware of.  He had all these appointments in [place], and he thinks these were at [facility].  He goes to sleep at 10 pm.  He falls asleep after 2 hours.  He wakes up 3 or 4 times, and then has trouble falling back to sleep.  He gets out of bed at 7 am.  He feels tired in the morning.  He denies morning headache.  He does not use anything to help him fall sleep or stay awake.  He denies sleep walking, sleep talking, bruxism, or nightmares.  There is no history of restless legs.  He denies sleep hallucinations, sleep paralysis, or cataplexy.  The Epworth score is 9 out of 24.   07/06/2018  Patient presents today to follow-up on pulmonary nodules. He was seen back in 2017 by Dr. Halford Chessman and ordered for ONO, full pulmonary function testing, labs and serology. Former smoker, smoked 1ppd from the age [age]. Reports quitting in 2012.   Complains of coughing fits with clear mucus x 71months. He experiences shortness of breath when coughing. States that he coughs so much at night that he throws up and reports that he stops breathing. Rescue inhaler helps  some. Unsure if he is using Symbicort twice daily as prescribed. Stopped breo.   CT Chest 05/15/18- Re-demonstration bilateral lung nodules, no new or enlarging nodules since June 2019. 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 Recommend follow up 6-12 months. Follow CT out for 2 years. Refer to ILD clinic. Refer to lung cancer screening.   Exposure history: Work- [employer], he was around fumes and gas without a mask (on disability for neck/back pain) NO birds, cats/dogs Has carpet No hot tub No beach house  Spirometry 07/06/2018 FVC 3.1 (59%), FEV1 2.7 (67%), ratio  87 (unable to complete)  Pulmonary tests CT angio chest 12/23/15 >> mild paraseptal emphysema, progressive fibrosis, several b/l nodules up to 8 mm     OV 08/23/2018  Subjective:  Patient ID: Bradley Rice, male , DOB: 1961-06-05 , age 77 y.o. , MRN: [id number] , ADDRESS: [place] [id number]   08/23/2018 -   Chief Complaint  Patient presents with  . Follow-up    PFT not performed.  Pt states he is getting better and states he is no longer running a fever. Pt states that he does have complaints of SOB, cough with occ white phlegm, and CP.     HPI Bradley Rice  59 y.o. -presents to the ILD clinic as a second opinion referral from Dr. Ricarda Frame nurse practitioner.  He has a diagnosis of ALS and under follow-up at [facility].  He is still fairly functional and that he can do his activities of daily living.  He does not have any dysphagia.  He has now developed interstitial lung disease on top of his underlying emphysema which she is known to have.  Therefore has been referred here.  He tells me that he has had insidious worsening of shortness of breath for the last year or so.  He has difficulty doing activities of daily living because of dyspnea.  He does not believe his ALS is causing his dyspnea.  He also has significant severe chronic  cough but sometimes causes near presyncope.  He is only on Symbicort for his previous diagnosis of emphysema.  He was last seen in our practice in 2017 but after that reestablished in the last few months during which time the ILD was discovered and he is now been referred to the ILD clinic.  He reports having answered all the questions on interstitial lung disease questionnaire but this questionnaire is now missing with his answers.  Therefore he had to redo. Does not want to do it 08/23/18    CT scan of the chest January 24, 2018 in my personal visualization shows classic UIP findings.  This includes bilateral bibasal subpleural disease with honeycombing and traction bronchiectasis.  Further up in the lungs is emphysema with subpleural bulla as opposed to honeycombing.  These findings predate 2017.  Looking back he might have early ILD changes even in 2014 CT chest.    Results for Bradley Rice, Bradley Rice (MRN [id number]) as of 08/23/2018 09:45  Ref. Range 06/10/2016 14:48 07/06/2018 10:53  Anti Nuclear Antibody(ANA) Latest Ref Range: NEGATIVE  Negative NEGATIVE  Cyclic Citrullin Peptide Ab Latest Units: UNITS <16 <16  ds DNA Ab Latest Units: IU/mL  <1  Jo-1 Antibody, IgG Latest Ref Range: <1.0 NEG AI  <1.0 NEG   RA Latex Turbid. Latest Ref Range: <14 IU/mL <14 <14  SSA (Ro) (ENA) Antibody, IgG Latest Ref Range: <1.0 NEG AI <1.0 NEG <1.0 NEG  SSB (La) (ENA) Antibody, IgG Latest Ref Range: <1.0 NEG AI <1.0 NEG <1.0 NEG  Scleroderma (Scl-70) (ENA) Antibody, IgG Latest Ref Range: <1.0 NEG AI <1.0 NEG <1.0 NEG    OV 05/30/2019  Subjective:  Patient ID: Bradley Rice, male , DOB: 1961-03-26 , age 22 y.o. , MRN: [id number] , ADDRESS: [place]  [id number]   05/30/2019 -   Chief Complaint  Patient presents with  . Follow-up    Patient reports that he still has sob with exertion and occ. cough.    Follow-up IPF [diagnosis given January 2020, clinical diagnosis, based on male gender, clubbing, family  history, smoking history, negative serology and associated emphysema] with associated emphysema in the setting of associated ALS.  Delays with Esbriet start in early 2020  HPI Bradley Rice 58 y.o. -presents for follow-up.  Last seen in early 2020.  After that lost to follow-up because of the COVID-19 pandemic.  He tells me that since early this year he is significantly worse with shortness of breath and cough.  His symptom scores are listed below and is significant.  He says his cough is relieved only by using his mom's oxygen.  I find out that his mom also suffers from pulmonary fibrosis and sees  somebody at [facility].  He says the shortness of breath is also relieved by his mom's portable oxygen.  He is very frustrated by his symptoms.  He states his lung is full of crackles.  Apparently try to attend pulmonary rehabilitation earlier in 2020 had any pain but because of his ALS was deemed unsafe.  I am not able to find the documentation on this.  At last visit I started him on pirfenidone/Esbriet.  He tells me that he took a month supply of this.  He says he is frustrated by our office there was significant delays in getting him the [place] at proper pricing.  Apparently he had to pay a co-pay $1500 but later this got reduced to $500.  Nevertheless it was too expensive.  Currently is just on supportive care   He is not on any oxygen therapy.  He is using his mom's oxygen.  This is based on no evidence of desaturation.  However he tells me this is the main thing that helps his cough and shortness of breath.  He is willing to see go through overnight oxygen testing and see if he desaturates and if so start oxygen and see if his cough improves.  He says that cough medicines have not helped.  He was asking for stronger medication but after discussion he is willing to wait.  Discussed research concepts  Overall in terms of his ALS it is stable  He has had his flu shot.   He also has emphysema: He was  on an inhaled anticholinergic but he is run out and not taking.    IMPRESSION:COMPARISON:  CT chest, 05/15/2018, 01/24/2018, 12/23/2015 1. There is redemonstrated moderate pulmonary fibrosis in a bibasilar predominant pattern featuring extensive peripheral bronchiolectasis, mild bronchiectasis, and areas of honeycombing, particularly in the right lung base. Fibrotic findings are significantly worsened over time in comparison to prior CTs dating back to 12/23/2015, with increased bronchiolectasis. There is likely underlying paraseptal emphysema. Findings are generally consistent with UIP pattern pulmonary fibrosis by ATS pulmonary fibrosis criteria.  2. There are multiple bilateral pulmonary nodules and areas of consolidative opacity which are unchanged compared to prior examination dating back to 12/23/2015 and benign, for example a 7 mm nodule of the right upper lobe (series 10, image 131).  3.  Coronary artery disease.   Electronically Signed   By: Eddie Candle M.D.   On: 10/17/2018 09:11   OV 06/25/2019  Subjective:  Patient ID: Bradley Rice, male , DOB: 07/25/1961 , age 70 y.o. , MRN: [id number] , ADDRESS: [place] [id number]   06/25/2019 -   Chief Complaint  Patient presents with  . Follow-up    6MW attempted and pt stated he was not able to complete the entire walk due to breathing. Pt states he is becoming very SOB much easier and is having drops in O2 sats. Pt states he is also coughing.    Follow-up IPF [diagnosis given January 2020, clinical diagnosis, based on male gender, clubbing, family history, smoking history, negative serology and associated emphysema] with associated emphysema in the setting of associated ALS.  Delays with Esbriet start in early 2020. Restart esbriet 06/18/2019 or 06/19/2019  Known associated emphysema with Spiriva and Symbicort  Coronary artery calcification seen on CT scan March 2020.  HPI  Bradley Rice 59 y.o.  -presents for the above issues for discussion only visit.  At this visit we are here to follow how he is tolerating pirfenidone.  He is 1 week into pirfenidone at this point.  He is still on 1 pill 3 times daily.  He says he is spacing at least 6 7 hours between the pills.  He takes it with food.  He is trying different foods.  Despite this he is having mild to moderate amount of nausea.  He says this happened the previous time as well.  He says he tried some Dramamine yesterday but this did not help or helped somewhat.  He is willing to try other options.  We discussed Zofran and ginger capsules.  Also last time he did not desaturate but he is having significant amount of dyspnea.  Therefore we did a 6-minute walk test.  He did 7 laps and stop but he dropped only pulse ox of 2% from 100% to 98%.  He is out of proportion dyspnea.  He also tells me now that he has some occasional chest pains with exertion relieved by rest.  CT scan in March 2020 shows coronary artery calcification.  Otherwise he is stable.  OV 09/23/2019 -telephone visit.  Risks, benefits and limitations of telephone visit explained.  Patient identified with 2 identifiers.  Subjective:  Patient ID: Bradley Rice, male , DOB: 07-Nov-1960 , age 15 y.o. , MRN: [id number] , ADDRESS: [place] [id number]   09/23/2019 -    FU IPF Fu associated emphysema - spiriva/symbicort   HPI Bradley Rice 59 y.o. -last seen just before Thanksgiving 2020.  He had GI intolerance with pirfenidone.  We did some modifications to the regimen.  After that there was some difficulty getting hold of him but he finally did.  He tells me that 1 month ago he stopped his pirfenidone because of significant nausea vomiting and weight loss.  He says he followed all the instructions I gave him with modification.  He absolutely will not go back to this medication.  He says since stopping the medication his GI symptoms have resolved and he is gaining his weight back  although not back to baseline.  He is frustrated by the experience.  In fact he almost wanted to join a IV infusion research protocol that involve placebo because it would not have GI side effects.  This telephone visit is to discuss neck step in his management approach.  I explained to him that nintedanib is the next logical step in his standard of care approach.  The other option would be to join there is zephyrus  IV infusion protocol and once he is on stable IV infusion does then consider adding nintedanib.  The other option is to go to nintedanib right now and then depending on stability versus failure consider the next appropriate research protocol.  He defer the decision on this to me.  But he was willing to go over the standard of care approach and stop nintedanib if he had GI side effects.      OV 12/31/2019  Subjective:  Patient ID: Bradley Rice, male , DOB: 05-18-1961 , age 48 y.o. , MRN: [id number] , ADDRESS: [place] [address]   12/31/2019 -   Chief Complaint  Patient presents with  . Follow-up    PFT.  sob all the time.  HA, had CT scan done.  off Ofev for 1 week.   Follow-up IPF [diagnosis given January 2020, clinical diagnosis, based on male gender, clubbing, family history, smoking history, negative serology and associated emphysema] with associated emphysema in the setting of  associated ALS.  Delays with Esbriet start in early 2020. Restart esbriet 06/18/2019 or 06/19/2019. Stopped Janm 2021 due to GI issues.  Restart nintedanib February 2021 but stopped in May 2021 because of headache that was unrelated.  Restarting nintedanib again in June 2021.  Known associated emphysema with Spiriva and Symbicort  Coronary artery calcification seen on CT scan March 2020. -Low risk nuclear medicine stress test Jan 2021  Lung nodules  =- Lungs/Pleura: No pleural fluid. Redemonstration of basilar and peripheral predominant subpleural reticulation with traction bronchiectasis,  architectural distortion, and areas of mild honeycombing. Concurrent centrilobular and paraseptal emphysema. Multiple bilateral pulmonary nodules. Those nodules measure greater than 6 mm are similar on the 10/16/2018 diagnostic CT. For example a left upper lobe nodule of volume derived equivalent diameter 9.0 mm is not significantly changed.  HPI Bradley Rice 59 y.o. -presents for follow-up.  Last seen in February 2021 by myself.  At that time instituted nintedanib but then he called back had a televisit with nurse practitioner.  He was reporting headaches after starting nintedanib.  He stopped the nintedanib with the headaches did not resolve.  He went and saw the EDP for the headaches..  Nevertheless he was reassured.  However the headache still has not resolved.  He feels it is because of his glasses.  He has never seen a neurologist because of the headaches.  In terms of his ILD his symptoms are worse.  He is off the nintedanib because the there was concern the headache was because of the nintedanib.  He says he never had any GI side effects with the nintedanib and is willing to restart the nintedanib.  He feels he is more short of breath.  Is not able to do things with the grandkids that he used to do even a few months ago.  He says when he checks his pulse ox at home with exertion it dropped to the 70s.  He feels a little bit fatigue does not want to walk today but he finally agreed for a simple walk test.  When we walked him 185 feet x 3 laps on room air he did not drop below 94%.  He still wants a portable oxygen.  He continues to use his overnight oxygen study.  He is interested in a transplant referral because of his young age, lean body mass index and also progressive symptoms.  He did have a nuclear medicine stress test earlier this year this was normal.  He did have a follow-up low-dose CT scan of the chest for lung nodules and his lung nodules are stable in the last 1 year.  I am not so sure  that he is seen the genetics counselor attended pulmonary rehabilitation as yet.    SYMPTOM SCALE - ILD 05/30/2019  06/25/2019  12/31/2019 Off ofev - 150#  O2 use ra    Shortness of Breath 0 -> 5 scale with 5 being worst (score 6 If unable to do)    At rest 4 5 5   Simple tasks - showers, clothes change, eating, shaving 4 3 5   Household (dishes, doing bed, laundry) 4 3 6   Shopping 5 5 6   Walking level at own pace 5 4 6   Walking up Stairs 5 5   Total (40 - 48) Dyspnea Score 37 34   How bad is your cough? 5 4 5   How bad is your fatigue 4 4 0  nauesa   0  vomit   0  dirahee  0  anxiety   0  depression   0         Simple office walk 185 feet x  3 laps goal with forehead probe 08/23/2018  05/30/2019  06/25/2019  12/31/2019    O2 used Room air Room air RA   Number laps completed 3 3 6wmd   Comments about pace Slow pace* steady 7 laps   Resting Pulse Ox/HR 99% and 93/min 98% and HR 96/min    Final Pulse Ox/HR 98% and 110/min 98% and HR 105/mn  Did not drop below 94% on simple walk test  Desaturated </= 88% no no    Desaturated <= 3% points no no    Got Tachycardic >/= 90/min yes Yes even at rest    Symptoms at end of test Moderate dyspnea, lightheaded moderate Did ntt drop pulse ox . sTiooed due to dyspnea   Miscellaneous comments  stable      Results for Bradley Rice, Bradley Rice (MRN [id number]) as of 12/31/2019 10:00  Ref. Range 05/30/2019 10:38 12/31/2019 09:29  FVC-Pre Latest Units: L 3.30 3.15  FVC-%Pred-Pre Latest Units: % 64 61  Results for Bradley Rice, Bradley Rice (MRN [id number]) as of 12/31/2019 10:00  Ref. Range 05/30/2019 10:38 12/31/2019 09:29  DLCO unc Latest Units: ml/min/mmHg 12.85 18.33  DLCO unc % pred Latest Units: % 43 62    ROS - per HPI     has a past medical history of ALS (amyotrophic lateral sclerosis) ([place]), Asthma, Back pain, COPD (chronic obstructive pulmonary disease) ([place]), Coronary artery calcification seen on CT scan, DDD (degenerative disc disease), lumbar,  Depression, Dysphagia, History of blood transfusion (2014), History of lung cancer, History of stroke (2015), Idiopathic pulmonary fibrosis ([place]), Migraine, and Spider bite (2012).   reports that he quit smoking about 4 years ago. His smoking use included cigarettes. He has a 30.00 pack-year smoking history. He has never used smokeless tobacco.  Past Surgical History:  Procedure Laterality Date  . ANTERIOR CERVICAL DECOMP/DISCECTOMY FUSION  12/22/2011   Procedure: ANTERIOR CERVICAL DECOMPRESSION/DISCECTOMY FUSION 3 LEVELS;  Surgeon: Melina Schools, MD;  Location: [place];  Service: Orthopedics;  Laterality: N/A;  ACDF C5-T1  . ANTERIOR CERVICAL DECOMP/DISCECTOMY FUSION  06/25/2012   Procedure: ANTERIOR CERVICAL DECOMPRESSION/DISCECTOMY FUSION 1 LEVEL/HARDWARE REMOVAL;  Surgeon: [name], MD;  Location: [place];  Service: Orthopedics;  Laterality: N/A;  Removal anterior cervical plate C5-T1, Explore Fusion, Left C5-6, C6-7, C7-T1 Re-do Foraminotomy, Left open carpal tunnel release with release of ulnar nerve at [facility], Posterior Cervical Fusion with lateral mass screw  . BACK SURGERY    . CARDIAC CATHETERIZATION    . CARPAL TUNNEL RELEASE  06/25/2012   Procedure: CARPAL TUNNEL RELEASE;  Surgeon: [name], MD;  Location: [place];  Service: Orthopedics;  Laterality: Left;  as above  . CHOLECYSTECTOMY  2009  . CORONARY ANGIOPLASTY  2001   [facility]  . LUMBAR FUSION  2000  . POSTERIOR CERVICAL FUSION/FORAMINOTOMY  06/25/2012   Procedure: POSTERIOR CERVICAL FUSION/FORAMINOTOMY LEVEL 1;  Surgeon: [name], MD;  Location: [place];  Service: Orthopedics;  Laterality: N/A;  as above  . POSTERIOR CERVICAL FUSION/FORAMINOTOMY N/A 12/17/2013   Procedure: REMOVAL OF POSTERIOR CERVICAL FUSION LATERAL MASS SCREWS AND RODS C5-T1, EXPLORE FUSION, LEFT SIX-SEVEN FORAMINOTOMY;  Surgeon: [name], MD;  Location: [place];  Service: Orthopedics;  Laterality: N/A;  . VASECTOMY  1990    Allergies    Allergen Reactions  . Pirfenidone Nausea And Vomiting  and Other (See Comments)    Weight loss    Immunization History  Administered Date(s) Administered  . Influenza Split 06/26/2012  . Influenza,inj,Quad PF,6+ Mos 08/24/2015, 08/25/2016, 04/30/2018  . Influenza-Unspecified 06/26/2012, 08/24/2015, 08/25/2016, 05/21/2019  . Moderna SARS-COVID-2 Vaccination 10/31/2019, 12/03/2019  . Pneumococcal Polysaccharide-23 06/25/2019  . Tdap 06/01/2013    Family History  Problem Relation Age of Onset  . ALS Mother   . Cancer Mother   . Cancer Sister   . ALS Sister   . Cancer Brother        1 brother  . Anesthesia problems Neg Hx      Current Outpatient Medications:  .  albuterol (PROVENTIL HFA;VENTOLIN HFA) 108 (90 Base) MCG/ACT inhaler, Inhale 2 puffs into the lungs every 6 (six) hours as needed for wheezing or shortness of breath., Disp: 1 Inhaler, Rfl: 5 .  albuterol (PROVENTIL) (2.5 MG/3ML) 0.083% nebulizer solution, Take 3 mLs (2.5 mg total) by nebulization every 6 (six) hours as needed for wheezing or shortness of breath., Disp: 150 mL, Rfl: 1 .  aspirin EC 81 MG tablet, Take 81 mg by mouth daily., Disp: , Rfl:  .  budesonide-formoterol (SYMBICORT) 160-4.5 MCG/ACT inhaler, Inhale 2 puffs into the lungs 2 (two) times daily., Disp: 1 Inhaler, Rfl: 3 .  methocarbamol (ROBAXIN) 750 MG tablet, Take 1 tablet (750 mg total) by mouth 2 (two) times daily as needed., Disp: 180 tablet, Rfl: 1 .  metoprolol succinate (TOPROL-XL) 50 MG 24 hr tablet, Take 1 tablet (50 mg total) by mouth daily., Disp: 90 tablet, Rfl: 1 .  Nutritional Supplements (ENSURE HIGH PROTEIN) LIQD, Take 237 mLs by mouth 3 (three) times daily., Disp: 237 mL, Rfl: 6 .  ondansetron (ZOFRAN) 4 MG tablet, Take 1 tablet (4 mg total) by mouth 3 (three) times daily., Disp: 90 tablet, Rfl: 3 .  oxyCODONE (ROXICODONE) 15 MG immediate release tablet, Take 15 mg by mouth 4 (four) times daily., Disp: , Rfl: 0 .  sildenafil (REVATIO) 20  MG tablet, Take 2-5 tabs PRN prior to sexual activity., Disp: , Rfl:  .  tiotropium (SPIRIVA) 18 MCG inhalation capsule, Place 1 capsule (18 mcg total) into inhaler and inhale daily., Disp: 30 capsule, Rfl: 6 .  Nintedanib (OFEV) 150 MG CAPS, Take 1 capsule (150 mg total) by mouth 2 (two) times daily. (Patient not taking: Reported on 12/31/2019), Disp: 180 capsule, Rfl: 3      Objective:   Vitals:   12/31/19 1009  BP: 110/70  Pulse: 79  Temp: 98.1 F (36.7 C)  TempSrc: Oral  SpO2: 96%  Weight: 150 lb (68 kg)  Height: 6' (1.829 m)    Estimated body mass index is 20.34 kg/m as calculated from the following:   Height as of this encounter: 6' (1.829 m).   Weight as of this encounter: 150 lb (68 kg).  @WEIGHTCHANGE @  [employer]   12/31/19 1009  Weight: 150 lb (68 kg)     Physical Exam Thin male with significant clubbing Pleasant alert and oriented x3 faint basal crackles present.  No stigmata of connective tissue disease abdomen soft normal heart sounds.        Assessment:       ICD-10-CM   1. IPF (idiopathic pulmonary fibrosis) ([place])  J84.112   2. 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 Pulmonary emphysema, unspecified emphysema type ([place])  J43.9   4. Chronic tension-type headache, not intractable  G44.229   5. Nodule of left lung  R91.1        Plan:     Patient Instructions      ICD-10-CM   1. IPF (idiopathic pulmonary fibrosis) ([place])  J84.112   2. 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 Pulmonary emphysema, unspecified emphysema type ([place])  J43.9   4. Drug-induced nausea and vomiting  R11.2    T50.905A   5. Encounter for drug therapy  Z79.899      ICD-10-CM   1. IPF (idiopathic pulmonary fibrosis) ([place])  J84.112   2. 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 Pulmonary emphysema, unspecified emphysema type ([place])  J43.9   4. Chronic tension-type headache, not intractable  G44.229       IPF  (idiopathic pulmonary fibrosis) (HCC) Familial idiopathic pulmonary fibrosis (HCC)    -IPF might be worse - Headache is unrelated to nintedanib  Plan -RE- Start nintedanib 150 mg twice daily with food  -There is no guarantee that he will not have side effects with this drug but it is also possible that he will not have side effects with this drug.  Will have to take this twice daily with food and will have to intensity monitor  - refer lung transplant   - continue night o2  - do simple walk test 12/31/2019 for exercise hypoxemia. If normal,  Return ASAP for 6 min walk test  - address pulm rehab, support group, genetics referral in future along with research as care option   Pulmonary emphysema, unspecified emphysema type ([place])  Stable -Continue Spiriva and Symbicort daily with albuterol as needed   Headache   - refer Dr Delfin Edis  Lung nodule left - sable march 2020 -> April 2021  Plan  - cancel LDCT - Do HRCT for ILD in May 2022 - can order next time  Follow-up - keep up transplant referral -4-6 weeks tele visit with APP to ensure ofev tolerance and LFT check  =12 weeks with Dr Chase Caller - 30 min face to face     SIGNATURE    Dr. Brand Males, M.D., F.C.C.P,  Pulmonary and Critical Care Medicine Staff Physician, [place] Director - Interstitial Lung Disease  Program  [facility] [place], [place], [address]  Pager: [phone], If no answer or between  15:00h - 7:00h: call [phone] Telephone: [phone]  10:35 AM 12/31/2019

## 2019-12-31 NOTE — Patient Instructions (Addendum)
    ICD-10-CM   1. IPF (idiopathic pulmonary fibrosis) ([place])  J84.112   2. Familial idiopathic pulmonary fibrosis ([place])  J84.112   3. Pulmonary emphysema, unspecified emphysema type ([place])  J43.9   4. Drug-induced nausea and vomiting  R11.2    T50.905A   5. Encounter for drug therapy  Z79.899      ICD-10-CM   1. IPF (idiopathic pulmonary fibrosis) ([place])  J84.112   2. Familial idiopathic pulmonary fibrosis ([place])  J84.112   3. Pulmonary emphysema, unspecified emphysema type ([place])  J43.9   4. Chronic tension-type headache, not intractable  G44.229       IPF (idiopathic pulmonary fibrosis) (HCC) Familial idiopathic pulmonary fibrosis (HCC)    -IPF might be worse - Headache is unrelated to nintedanib  Plan -RE- Start nintedanib 150 mg twice daily with food  -There is no guarantee that he will not have side effects with this drug but it is also possible that he will not have side effects with this drug.  Will have to take this twice daily with food and will have to intensity monitor  - refer lung transplant   - continue night o2  - do simple walk test 12/31/2019 for exercise hypoxemia. If normal,  Return ASAP for 6 min walk test  - address pulm rehab, support group, genetics referral in future along with research as care option   Pulmonary emphysema, unspecified emphysema type (HCC)  Stable -Continue Spiriva and Symbicort daily with albuterol as needed   Headache   - refer Dr Delfin Edis  Lung nodule left - sable march 2020 -> April 2021  Plan  - cancel LDCT - Do HRCT for ILD in May 2022 - can order next time  Follow-up - keep up transplant referral -4-6 weeks tele visit with APP to ensure ofev tolerance and LFT check  =12 weeks with Dr Chase Caller - 30 min face to face

## 2019-12-31 NOTE — Progress Notes (Signed)
PFT completed today.  

## 2020-01-06 ENCOUNTER — Telehealth: Payer: Self-pay | Admitting: Internal Medicine

## 2020-01-06 ENCOUNTER — Ambulatory Visit (INDEPENDENT_AMBULATORY_CARE_PROVIDER_SITE_OTHER): Payer: Medicaid Other

## 2020-01-06 ENCOUNTER — Other Ambulatory Visit: Payer: Self-pay

## 2020-01-06 DIAGNOSIS — J439 Emphysema, unspecified: Secondary | ICD-10-CM | POA: Diagnosis not present

## 2020-01-06 DIAGNOSIS — G1221 Amyotrophic lateral sclerosis: Secondary | ICD-10-CM

## 2020-01-06 DIAGNOSIS — J84112 Idiopathic pulmonary fibrosis: Secondary | ICD-10-CM

## 2020-01-06 DIAGNOSIS — R42 Dizziness and giddiness: Secondary | ICD-10-CM

## 2020-01-06 NOTE — Telephone Encounter (Signed)
Also have him do CBC,, magnesium and phosphorus,  chemistry, liver function test

## 2020-01-06 NOTE — Telephone Encounter (Signed)
I am really puzzled as well.  I need to access his records from [facility].  I got into care everywhere but could not see his records at [facility].  Do not know why.  The main thing is we need to see if pulmonary embolism was ruled out.  Please ask him if they did a CT angiogram chest.  In the absence of this information he might need to go to [facility] and get some blood work including D-dimer troponin BNP and based on these results might have to get a CT angiogram chest  If these are negative then his dizziness can be due to his ALS as well along with the fibrosis  If things are getting worse he should definitely go to ER  From our perspective from the record standpoint it is better if he goes to [facility] ([address] [place] [address] )  Please let me know what he says

## 2020-01-06 NOTE — Telephone Encounter (Signed)
Patient came for 6 min walk today. He was not able to complete the walk due to dizziness and feeling like he was going to pass out. Patient did pass out on Saturday and was out for 4 hours when he came back to at [facility]. Patient was told his oxygen was 87%. Patient wants to know why is he so short of breath if his oxygen was above 90 % after walk.  Patient also was wondering if there is a test that he can do that can tell him what is causing him to take oxygen into his lungs but not get it out and to circulate.   Also patient was inquiring about his lung transplant. Patient is fearful of going out of his house due to his breathing condition.  Dr. Chase Caller please advise.

## 2020-01-06 NOTE — Telephone Encounter (Signed)
Called pt and advised message from the provider. Pt understood and verbalized understanding.   Labs ordered, pt stated he would go to [facility] in the morning to have blood drawn.   FYI MR

## 2020-01-07 NOTE — Telephone Encounter (Signed)
Additional labs have been added per MR.

## 2020-01-24 DIAGNOSIS — G894 Chronic pain syndrome: Secondary | ICD-10-CM | POA: Diagnosis not present

## 2020-01-24 DIAGNOSIS — M5136 Other intervertebral disc degeneration, lumbar region: Secondary | ICD-10-CM | POA: Diagnosis not present

## 2020-01-24 DIAGNOSIS — Z79899 Other long term (current) drug therapy: Secondary | ICD-10-CM | POA: Diagnosis not present

## 2020-01-27 NOTE — Progress Notes (Addendum)
Virtual Visit via Telephone Note  I connected with [name] on 01/28/20 at 10:00 AM EDT by telephone and verified that I am speaking with the correct person using two identifiers.  Location: Patient: Home Provider: [facility] - [address], [place], [place], [place] [address]   I discussed the limitations, risks, security and privacy concerns of performing an evaluation and management service by telephone and the availability of in person appointments. I also discussed with the patient that there may be a patient responsible charge related to this service. The patient expressed understanding and agreed to proceed.  Patient consented to consult via telephone: Yes People present and their role in pt care: Pt     History of Present Illness:   59 year old male former smoker followed in our office for interstitial lung disease  Past medical history: Multilevel spondylosis, TIA, postlaminectomy syndrome, anxiety, depression, hypertension, chronic pain, ALS Smoking history: Former smoker Maintenance: Community education officer, Symbicort 160, OFEV Patient of Dr. Chase Caller  Chief complaint: 1 month follow-up, short of breath, dizzy and lightheaded  59 year old male followed in our office for IPF.  He is completing 1 month follow-up from June/08/2019 visit with Dr. Chase Caller.  Patient reports that he has an upcoming appointment with [facility] transplant team on 02/14/2020.  Patient reports adherence to Spiriva HandiHaler and Symbicort 160.  Patient reports that he is adherent to taking Ofev.  He is tolerating this well.  Initially when starting Ofev he had aspects of diarrhea but he reports that he is now tolerating without any issues.  He is not having any current headaches.  Patient is struggling with obtaining oxygen for management of exertional hypoxemia.  Patient reports that his oxygen levels dropped to 80 and 85 when on room air with physical exertion.  At rest today on room air  patient's oxygen levels were 92%.  With exertion he dropped to 84%.  This was a telephonic visit.  Patient does not have portable oxygen.  He occasionally uses his mother's portable oxygen in order to make it to office visits.  His DME company is [employer].  He reports that they are aware of this.  We will follow up on this emergently today.  Lab work was ordered to further investigate his hypoxemia.  Patient reports that he presented to [facility] as instructed to obtain this in [place] so there were no records of it.  We will follow up on this today.  Patient will be in [place] on Thursday this week and could obtain the blood work in our office.  Observations/Objective:  01/28/20 - SPO2 - 92 on room air sitting  01/28/20 - HR -92  01/28/20 - SPO2 - 84 on room air walking  01/28/20 - SPO2 - 90 on 2L walking   * per patient via telephone encounter  Social History   Tobacco Use  Smoking Status Former Smoker  . Packs/day: 1.00  . Years: 30.00  . Pack years: 30.00  . Types: Cigarettes  . Quit date: 10/2015  . Years since quitting: 4.2  Smokeless Tobacco Never Used   Immunization History  Administered Date(s) Administered  . Influenza Split 06/26/2012  . Influenza,inj,Quad PF,6+ Mos 08/24/2015, 08/25/2016, 04/30/2018  . Influenza-Unspecified 06/26/2012, 08/24/2015, 08/25/2016, 05/21/2019  . Moderna SARS-COVID-2 Vaccination 10/31/2019, 12/03/2019  . Pneumococcal Polysaccharide-23 06/25/2019  . Tdap 06/01/2013      Assessment and Plan:  Discussion: Patient continued to have syncope and dizziness.  Likely due to patient's exertional hypoxemia  which she is unable to treat due to not having portable oxygen tanks.  We will send an emergent message over to his [facility] who is aware of patient's exertional hypoxemia.  They just did not simply call our office to notify us.  An order was placed by our triage team for follow-up.  Patient still needs to  complete the lab work that was originally instructed to be completed any pain.  Patient to come to our office on Thursday to complete this lab work  ER precautions were discussed with the patient if symptoms should worsen or he is having difficulty with his breathing or chest pain or leg pain he will need to present to the emergency room for further evaluation  IPF (idiopathic pulmonary fibrosis) ([place]) Plan: Continue Ofev Obtain blood work later on this week We will send message to our triage team to acutely follow-up on obtaining portable oxygen  Emphysema lung ([place]) Plan: Continue Spiriva HandiHaler Continue Symbicort 160  Chronic respiratory failure with hypoxia ([place]) Patient reporting oxygen levels 92% on room air today when sitting Patient reporting today that with physical exertion when patient ambulates on room air oxygen levels dropped to 84% Patient does not have portable oxygen for management of exertional hypoxemia Patient does have concentrator at home [place]: Message sent to triage team to coordinate follow-up with [employer] as patient needs access to portable O2 tanks Patient needs to maintain oxygen saturations above 88% 4-week follow-up with our office   Follow Up Instructions:  Return in about 4 weeks (around 02/25/2020), or if symptoms worsen or fail to improve, for Follow up with Dr. Purnell Shoemaker, Follow up with Wyn Quaker FNP-C.   I discussed the assessment and treatment plan with the patient. The patient was provided an opportunity to ask questions and all were answered. The patient agreed with the plan and demonstrated an understanding of the instructions.   The patient was advised to call back or seek an in-person evaluation if the symptoms worsen or if the condition fails to improve as anticipated.  I provided 32 minutes of non-face-to-face time during this encounter.   Lauraine Rinne, NP

## 2020-01-28 ENCOUNTER — Telehealth: Payer: Self-pay | Admitting: Pulmonary Disease

## 2020-01-28 ENCOUNTER — Other Ambulatory Visit: Payer: Self-pay

## 2020-01-28 ENCOUNTER — Ambulatory Visit (INDEPENDENT_AMBULATORY_CARE_PROVIDER_SITE_OTHER): Payer: Medicaid Other | Admitting: Pulmonary Disease

## 2020-01-28 ENCOUNTER — Encounter: Payer: Self-pay | Admitting: Pulmonary Disease

## 2020-01-28 DIAGNOSIS — J84112 Idiopathic pulmonary fibrosis: Secondary | ICD-10-CM

## 2020-01-28 DIAGNOSIS — J439 Emphysema, unspecified: Secondary | ICD-10-CM

## 2020-01-28 DIAGNOSIS — J9611 Chronic respiratory failure with hypoxia: Secondary | ICD-10-CM | POA: Insufficient documentation

## 2020-01-28 NOTE — Telephone Encounter (Signed)
01/28/20  Televisit today patient reporting hypoxia with exertion. O2 levels dropping to 80-85 on room air with exertion. Pt also symptomatic.   DME: [facility]  Pt has concentrator at home   Pt needs portable oxygen ASAP. Pt needs 2.5L with exertion.   Please place order to DME and contact them to follow up with pt emergently.   Wyn Quaker FNP

## 2020-01-28 NOTE — Telephone Encounter (Signed)
Spoke with Cecille Rubin at [facility], the patient needs to be re qualified for his oxygen for Medicaid.  He will need to have a walk in the office to re qualify.

## 2020-01-28 NOTE — Telephone Encounter (Signed)
Also labs that were previously ordered to be completed at [facility] patient reports he presented to Forestine Na at [facility] said there were no records of this.  Can all these orders to be placed under Dr. [place] Pop name to be evaluated and completed in our office on Thursday of this week.  Patient will be in [place].  Wyn Quaker, FNP

## 2020-01-28 NOTE — Telephone Encounter (Signed)
Order has been placed per Aaron Edelman and it has been marked urgent.

## 2020-01-28 NOTE — Telephone Encounter (Signed)
Labs were ordered under MR and should be able to be accessed when he comes in for his appointment.

## 2020-01-28 NOTE — Telephone Encounter (Signed)
Spoke with Cecille Rubin at [employer], states that in today's OV note, a room air sat at rest and with exertion is documented, but no oxygenated O2 sat at exertion is documented.   Spoke with Aaron Edelman who addended OV note to reflect this. addended OV note refaxed to [facility] at below verified fax #.  Nothing further needed at this time- will close encounter.

## 2020-01-28 NOTE — Assessment & Plan Note (Signed)
Plan: Continue Ofev Obtain blood work later on this week We will send message to our triage team to acutely follow-up on obtaining portable oxygen

## 2020-01-28 NOTE — Assessment & Plan Note (Signed)
Patient reporting oxygen levels 92% on room air today when sitting Patient reporting today that with physical exertion when patient ambulates on room air oxygen levels dropped to 84% Patient does not have portable oxygen for management of exertional hypoxemia Patient does have concentrator at home [place]: Message sent to triage team to coordinate follow-up with [employer] as patient needs access to portable O2 tanks Patient needs to maintain oxygen saturations above 88% 4-week follow-up with our office

## 2020-01-28 NOTE — Assessment & Plan Note (Signed)
Plan: Continue Spiriva HandiHaler Continue Symbicort 160

## 2020-01-28 NOTE — Patient Instructions (Addendum)
You were seen today by Lauraine Rinne, NP  for:   1. IPF (idiopathic pulmonary fibrosis) ([place])  Continue Ofev  Obtain lab work later on this week 4-week follow-up with our office  Continue follow-up with [facility] transplant team  Continue oxygen therapy as prescribed  >>>maintain oxygen saturations greater than 88 percent  >>>if unable to maintain oxygen saturations please contact the office  >>>do not smoke with oxygen  >>>can use nasal saline gel or nasal saline rinses to moisturize nose if oxygen causes dryness  2. Pulmonary emphysema, unspecified emphysema type (HCC)  Continue Symbicort 160 >>> 2 puffs in the morning right when you wake up, rinse out your mouth after use, 12 hours later 2 puffs, rinse after use >>> Take this daily, no matter what >>> This is not a rescue inhaler   Spiriva Handihaler 38mcg capsule >>> take 2 puffs using the inhaler and 1 capsule daily  >>> rinse mouth out after use  >>> take daily no matter what  >>> this is not a rescue inhaler    Follow Up:    Return in about 4 weeks (around 02/25/2020), or if symptoms worsen or fail to improve, for Follow up with Dr. Purnell Shoemaker, Follow up with Wyn Quaker FNP-C.   Please do your part to reduce the spread of COVID-19:      Reduce your risk of any infection  and COVID19 by using the similar precautions used for avoiding the common cold or flu:  Marland Kitchen Wash your hands often with soap and warm water for at least 20 seconds.  If soap and water are not readily available, use an alcohol-based hand sanitizer with at least 60% alcohol.  . If coughing or sneezing, cover your mouth and nose by coughing or sneezing into the elbow areas of your shirt or coat, into a tissue or into your sleeve (not your hands). Langley Gauss A MASK when in public  . Avoid shaking hands with others and consider head nods or verbal greetings only. . Avoid touching your eyes, nose, or mouth with unwashed hands.  . Avoid close contact with people who  are sick. . Avoid places or events with large numbers of people in one location, like concerts or sporting events. . If you have some symptoms but not all symptoms, continue to monitor at home and seek medical attention if your symptoms worsen. . If you are having a medical emergency, call 911.   [place] / e-Visit: [email or web address]         [facility]: [facility]: [phone]                   [facility]: [phone]     It is flu season:   >>> Best ways to protect herself from the flu: Receive the yearly flu vaccine, practice good hand hygiene washing with soap and also using hand sanitizer when available, eat a nutritious meals, get adequate rest, hydrate appropriately   Please contact the office if your symptoms worsen or you have concerns that you are not improving.   Thank you for choosing [facility] for your healthcare, and for allowing [place] to partner with you on your healthcare journey. I am thankful to be able to provide care to you today.   Wyn Quaker FNP-C

## 2020-01-28 NOTE — Addendum Note (Signed)
Addended by: Desmond Dike C on: 01/28/2020 11:35 AM   Modules accepted: Orders

## 2020-01-28 NOTE — Telephone Encounter (Signed)
Labs have been reorodered. [place] and a message has been left to make sure they received the STAT order that was faxed by our [facility] at [phone].

## 2020-01-28 NOTE — Telephone Encounter (Signed)
Please explained to [place] that is completely unacceptable that the patient is expected to be hypoxemic at home over the next couple of days.  From my understanding they have been aware that the patient has been having hypoxemic episodes and did not even notify our office.  Patient needs to be set up with oxygen acutely.  We can evaluate at next office visit.  Patient as ordered needs to receive oxygen prior to Thursday.  Patient is symptomatic and dropping into the low to mid 80s with chronic lung disease.  They can also coordinate an in office walk at their office to further evaluate if they do not trust the patient's evaluation.  I do not understand how it is logistically possible for a DME company to allow the patient to remain hypoxemic.  If they continue to refuse.  Please notify the patient that their DME company is not allowing them to receive oxygen based off their guidelines.  That is against our wishes.  Patient will need to present to an emergency room for further evaluation of hypoxemia.  That would be our recommendation as we cannot expect or recommend that the patient go 2-3 more days and drive in a car to our office when we know he is having known exertional hypoxemia.  Please also set the patient up on the walk schedule to formally document his hypoxemia for his insurance purposes.  Wyn Quaker, FNP

## 2020-01-28 NOTE — Telephone Encounter (Signed)
Aaron Edelman,  Please advise if you would like to order a qualifying walk for this patient.  We will be glad to call him and schedule him for the walk to re qualify for his oxygen with Medicaid.

## 2020-01-28 NOTE — Telephone Encounter (Signed)
Labs are currently ordered for lab collect. Labs need to be ordered for clinic collect so they can be completed as outlined for labs to be done in clinic this Thursday.   Was the DME contacted as instructed?  Wyn Quaker FNP

## 2020-01-29 ENCOUNTER — Telehealth: Payer: Self-pay | Admitting: Pulmonary Disease

## 2020-01-29 DIAGNOSIS — J84112 Idiopathic pulmonary fibrosis: Secondary | ICD-10-CM | POA: Diagnosis not present

## 2020-01-29 DIAGNOSIS — R05 Cough: Secondary | ICD-10-CM | POA: Diagnosis not present

## 2020-01-29 DIAGNOSIS — J439 Emphysema, unspecified: Secondary | ICD-10-CM | POA: Diagnosis not present

## 2020-01-29 NOTE — Telephone Encounter (Signed)
Spoke with pt, advised pt the CMN was sent  this morning and he stated the company already called him. Nothing further is needed.

## 2020-01-29 NOTE — Telephone Encounter (Signed)
Okay so where is the CMN so I can sign it?Aaron Edelman

## 2020-01-29 NOTE — Telephone Encounter (Signed)
The CMN was received yesterday afternoon 6/29. I gave it to [facility] 6/30 to sign.  Aaron Edelman signed the CMN and it has now been faxed to [facility].  I have received confirmation that the fax was received

## 2020-01-29 NOTE — Telephone Encounter (Signed)
Patient is currently on home oxygen with no portable device. [facility] is requesting a CMN PA signed and dated today, the qualifying walk (information came from the 6 minute walk done on 01/06/2020 and placed in the care coordination note on 01/28/2020). Patient will need an in office visit within 90 days to meet insurance requirement. Appointment made. FYI for NP Wyn Quaker.

## 2020-01-30 ENCOUNTER — Other Ambulatory Visit: Payer: Medicaid Other

## 2020-01-30 DIAGNOSIS — J84112 Idiopathic pulmonary fibrosis: Secondary | ICD-10-CM

## 2020-01-30 DIAGNOSIS — J439 Emphysema, unspecified: Secondary | ICD-10-CM | POA: Diagnosis not present

## 2020-01-30 LAB — HEPATIC FUNCTION PANEL
ALT: 13 U/L (ref 0–53)
AST: 16 U/L (ref 0–37)
Albumin: 4.3 g/dL (ref 3.5–5.2)
Alkaline Phosphatase: 84 U/L (ref 39–117)
Bilirubin, Direct: 0.1 mg/dL (ref 0.0–0.3)
Total Bilirubin: 0.3 mg/dL (ref 0.2–1.2)
Total Protein: 8.2 g/dL (ref 6.0–8.3)

## 2020-01-30 LAB — CBC WITH DIFFERENTIAL/PLATELET
Basophils Absolute: 0.1 10*3/uL (ref 0.0–0.1)
Basophils Relative: 1.3 % (ref 0.0–3.0)
Eosinophils Absolute: 0.4 10*3/uL (ref 0.0–0.7)
Eosinophils Relative: 3.9 % (ref 0.0–5.0)
HCT: 45.3 % (ref 39.0–52.0)
Hemoglobin: 15.4 g/dL (ref 13.0–17.0)
Lymphocytes Relative: 23.2 % (ref 12.0–46.0)
Lymphs Abs: 2.4 10*3/uL (ref 0.7–4.0)
MCHC: 33.9 g/dL (ref 30.0–36.0)
MCV: 95.5 fl (ref 78.0–100.0)
Monocytes Absolute: 1 10*3/uL (ref 0.1–1.0)
Monocytes Relative: 9.2 % (ref 3.0–12.0)
Neutro Abs: 6.6 10*3/uL (ref 1.4–7.7)
Neutrophils Relative %: 62.4 % (ref 43.0–77.0)
Platelets: 312 10*3/uL (ref 150.0–400.0)
RBC: 4.74 Mil/uL (ref 4.22–5.81)
RDW: 13.6 % (ref 11.5–15.5)
WBC: 10.5 10*3/uL (ref 4.0–10.5)

## 2020-01-30 LAB — D-DIMER, QUANTITATIVE: D-Dimer, Quant: 0.33 mcg/mL FEU (ref <0.50)

## 2020-01-30 LAB — MAGNESIUM: Magnesium: 2 mg/dL (ref 1.5–2.5)

## 2020-01-30 LAB — COMPREHENSIVE METABOLIC PANEL
ALT: 13 U/L (ref 0–53)
AST: 16 U/L (ref 0–37)
Albumin: 4.3 g/dL (ref 3.5–5.2)
Alkaline Phosphatase: 84 U/L (ref 39–117)
BUN: 9 mg/dL (ref 6–23)
CO2: 33 mEq/L — ABNORMAL HIGH (ref 19–32)
Calcium: 9.9 mg/dL (ref 8.4–10.5)
Chloride: 102 mEq/L (ref 96–112)
Creatinine, Ser: 0.72 mg/dL (ref 0.40–1.50)
GFR: 112.42 mL/min (ref >60.00)
Glucose, Bld: 113 mg/dL — ABNORMAL HIGH (ref 70–99)
Potassium: 4.2 mEq/L (ref 3.5–5.1)
Sodium: 139 mEq/L (ref 135–145)
Total Bilirubin: 0.3 mg/dL (ref 0.2–1.2)
Total Protein: 8.2 g/dL (ref 6.0–8.3)

## 2020-01-30 LAB — PHOSPHORUS: Phosphorus: 2.6 mg/dL (ref 2.3–4.6)

## 2020-01-30 LAB — TROPONIN I (HIGH SENSITIVITY): High Sens Troponin I: 4 ng/L (ref 2–17)

## 2020-01-30 LAB — BRAIN NATRIURETIC PEPTIDE: Pro B Natriuretic peptide (BNP): 30 pg/mL (ref 0.0–100.0)

## 2020-02-14 DIAGNOSIS — R918 Other nonspecific abnormal finding of lung field: Secondary | ICD-10-CM | POA: Diagnosis not present

## 2020-02-14 DIAGNOSIS — I2510 Atherosclerotic heart disease of native coronary artery without angina pectoris: Secondary | ICD-10-CM | POA: Diagnosis not present

## 2020-02-14 DIAGNOSIS — Z9981 Dependence on supplemental oxygen: Secondary | ICD-10-CM | POA: Diagnosis not present

## 2020-02-14 DIAGNOSIS — Z8673 Personal history of transient ischemic attack (TIA), and cerebral infarction without residual deficits: Secondary | ICD-10-CM | POA: Diagnosis not present

## 2020-02-14 DIAGNOSIS — E785 Hyperlipidemia, unspecified: Secondary | ICD-10-CM | POA: Diagnosis not present

## 2020-02-14 DIAGNOSIS — Z87891 Personal history of nicotine dependence: Secondary | ICD-10-CM | POA: Diagnosis not present

## 2020-02-14 DIAGNOSIS — J84112 Idiopathic pulmonary fibrosis: Secondary | ICD-10-CM | POA: Diagnosis not present

## 2020-02-14 DIAGNOSIS — Z01818 Encounter for other preprocedural examination: Secondary | ICD-10-CM | POA: Diagnosis not present

## 2020-02-17 ENCOUNTER — Ambulatory Visit: Payer: Medicaid Other | Admitting: Pulmonary Disease

## 2020-02-21 DIAGNOSIS — M5136 Other intervertebral disc degeneration, lumbar region: Secondary | ICD-10-CM | POA: Diagnosis not present

## 2020-02-21 DIAGNOSIS — Z79899 Other long term (current) drug therapy: Secondary | ICD-10-CM | POA: Diagnosis not present

## 2020-02-21 DIAGNOSIS — M129 Arthropathy, unspecified: Secondary | ICD-10-CM | POA: Diagnosis not present

## 2020-02-21 DIAGNOSIS — G894 Chronic pain syndrome: Secondary | ICD-10-CM | POA: Diagnosis not present

## 2020-03-01 DIAGNOSIS — J84112 Idiopathic pulmonary fibrosis: Secondary | ICD-10-CM | POA: Diagnosis not present

## 2020-03-01 DIAGNOSIS — J439 Emphysema, unspecified: Secondary | ICD-10-CM | POA: Diagnosis not present

## 2020-03-03 ENCOUNTER — Telehealth: Payer: Self-pay | Admitting: Internal Medicine

## 2020-03-03 DIAGNOSIS — J84112 Idiopathic pulmonary fibrosis: Secondary | ICD-10-CM

## 2020-03-03 NOTE — Telephone Encounter (Signed)
Spoke with Bradley Rice and informed her we would rout CXR results to MR as soon as we received them. Belenda Cruise stated understanding.  Routing to MR and Raquel Sarna

## 2020-03-05 NOTE — Telephone Encounter (Signed)
Called and spoke with Dani Gobble at [place], states that since pt is no longer being considered for a lung transplant his case is closed and all follow up care is being deferred to his local pulmonologist ([place]).   Therefore I did not send [facility] the LDCT results.   HRCT has been ordered.  Forwarding to lung nodule team at [facility] request.  Also forwarding back to MR as FYI regarding pt's case being closed out through [place].

## 2020-03-05 NOTE — Telephone Encounter (Signed)
Triage: Please send [facility] results of the April 2021 LDCT. They were worried about nodule. . On th CXR there was LUL nodule. But on 4/27 the LUL nodule was 40mm and stable.   Plan  - for triage: given fact nodule is concerning on cxr and last CT was > 3 months ago -> please do HRCT without contrast  - for lung nodul team: can we skip annual LDCT but instead do annual HRCT - he has IPF and HRCT would be better for progression. I can take over if needed but doingn LDCT is not super optimal from ILD standpoint. Please advise

## 2020-03-09 ENCOUNTER — Telehealth: Payer: Self-pay | Admitting: Acute Care

## 2020-03-09 NOTE — Telephone Encounter (Signed)
Dr. Chase Caller, If you are going to do annual HRCT to evaluate for progression of this patient's IPF, then it would make for sense for you to follow for pulmonary nodules also. Please confirm that you will take over lung cancer screening in a telephone note so that we have documentation  of who if going to manage this moving forward. I am always concerned about patient's falling through gaps . Thanks so much.

## 2020-03-11 NOTE — Telephone Encounter (Signed)
Thanks so much Dr. Chase Caller!!

## 2020-03-11 NOTE — Telephone Encounter (Signed)
LMTCB- when pt calls back just needs appt with MR after ct 03/27/20

## 2020-03-11 NOTE — Telephone Encounter (Signed)
pls set up office visit after HRCT. I will take over nodule followup and annual lung cancer screen as well. Copying Anselm Lis on this because she wanted documentation

## 2020-03-12 NOTE — Telephone Encounter (Signed)
LMTCB x 2 - when pt calls back just needs appt with MR after ct 03/27/20

## 2020-03-24 DIAGNOSIS — G894 Chronic pain syndrome: Secondary | ICD-10-CM | POA: Diagnosis not present

## 2020-03-24 DIAGNOSIS — Z79899 Other long term (current) drug therapy: Secondary | ICD-10-CM | POA: Diagnosis not present

## 2020-03-24 DIAGNOSIS — M5136 Other intervertebral disc degeneration, lumbar region: Secondary | ICD-10-CM | POA: Diagnosis not present

## 2020-03-27 ENCOUNTER — Ambulatory Visit (HOSPITAL_COMMUNITY): Admission: RE | Admit: 2020-03-27 | Payer: Medicaid Other | Source: Ambulatory Visit

## 2020-04-01 DIAGNOSIS — G1221 Amyotrophic lateral sclerosis: Secondary | ICD-10-CM | POA: Diagnosis not present

## 2020-04-01 DIAGNOSIS — J849 Interstitial pulmonary disease, unspecified: Secondary | ICD-10-CM | POA: Diagnosis not present

## 2020-04-01 DIAGNOSIS — Z20822 Contact with and (suspected) exposure to covid-19: Secondary | ICD-10-CM | POA: Diagnosis not present

## 2020-04-01 DIAGNOSIS — J439 Emphysema, unspecified: Secondary | ICD-10-CM | POA: Diagnosis not present

## 2020-04-01 DIAGNOSIS — M791 Myalgia, unspecified site: Secondary | ICD-10-CM | POA: Diagnosis not present

## 2020-04-01 DIAGNOSIS — J84112 Idiopathic pulmonary fibrosis: Secondary | ICD-10-CM | POA: Diagnosis not present

## 2020-04-22 DIAGNOSIS — Z79899 Other long term (current) drug therapy: Secondary | ICD-10-CM | POA: Diagnosis not present

## 2020-04-22 DIAGNOSIS — G894 Chronic pain syndrome: Secondary | ICD-10-CM | POA: Diagnosis not present

## 2020-04-22 DIAGNOSIS — M5136 Other intervertebral disc degeneration, lumbar region: Secondary | ICD-10-CM | POA: Diagnosis not present

## 2020-04-22 DIAGNOSIS — Z9189 Other specified personal risk factors, not elsewhere classified: Secondary | ICD-10-CM | POA: Diagnosis not present

## 2020-05-01 DIAGNOSIS — R051 Acute cough: Secondary | ICD-10-CM | POA: Diagnosis not present

## 2020-05-01 DIAGNOSIS — J84112 Idiopathic pulmonary fibrosis: Secondary | ICD-10-CM | POA: Diagnosis not present

## 2020-05-01 DIAGNOSIS — J439 Emphysema, unspecified: Secondary | ICD-10-CM | POA: Diagnosis not present

## 2020-05-20 DIAGNOSIS — G894 Chronic pain syndrome: Secondary | ICD-10-CM | POA: Diagnosis not present

## 2020-05-20 DIAGNOSIS — Z79899 Other long term (current) drug therapy: Secondary | ICD-10-CM | POA: Diagnosis not present

## 2020-05-20 DIAGNOSIS — Z9189 Other specified personal risk factors, not elsewhere classified: Secondary | ICD-10-CM | POA: Diagnosis not present

## 2020-05-20 DIAGNOSIS — M5136 Other intervertebral disc degeneration, lumbar region: Secondary | ICD-10-CM | POA: Diagnosis not present

## 2020-06-01 DIAGNOSIS — J439 Emphysema, unspecified: Secondary | ICD-10-CM | POA: Diagnosis not present

## 2020-06-01 DIAGNOSIS — J84112 Idiopathic pulmonary fibrosis: Secondary | ICD-10-CM | POA: Diagnosis not present

## 2020-06-01 DIAGNOSIS — R051 Acute cough: Secondary | ICD-10-CM | POA: Diagnosis not present

## 2020-06-15 ENCOUNTER — Telehealth: Payer: Self-pay | Admitting: Family

## 2020-06-15 DIAGNOSIS — Z9189 Other specified personal risk factors, not elsewhere classified: Secondary | ICD-10-CM | POA: Diagnosis not present

## 2020-06-15 DIAGNOSIS — G894 Chronic pain syndrome: Secondary | ICD-10-CM | POA: Diagnosis not present

## 2020-06-15 DIAGNOSIS — Z79899 Other long term (current) drug therapy: Secondary | ICD-10-CM | POA: Diagnosis not present

## 2020-06-15 DIAGNOSIS — M5136 Other intervertebral disc degeneration, lumbar region: Secondary | ICD-10-CM | POA: Diagnosis not present

## 2020-06-15 NOTE — Telephone Encounter (Signed)
Pt stated that [facility] ordered high protein ensure and that they are out of them and have been for a few months. Pt is asking for an order to be sent over for ensure, not high protein, since they are unenviable. Need sent to [facility]

## 2020-06-15 NOTE — Telephone Encounter (Signed)
Please advise and send new Rx if appropriate to change from high protein ensure since pharmacy is out of stock to regular ensure

## 2020-06-16 MED ORDER — ENSURE ENLIVE PO LIQD: 237.0000 mL | Freq: Two times a day (BID) | ORAL | 12 refills | Status: DC

## 2020-06-16 NOTE — Telephone Encounter (Signed)
Prescription sent to pharmacy.

## 2020-06-30 DIAGNOSIS — Z23 Encounter for immunization: Secondary | ICD-10-CM | POA: Diagnosis not present

## 2020-07-01 DIAGNOSIS — J439 Emphysema, unspecified: Secondary | ICD-10-CM | POA: Diagnosis not present

## 2020-07-01 DIAGNOSIS — R051 Acute cough: Secondary | ICD-10-CM | POA: Diagnosis not present

## 2020-07-01 DIAGNOSIS — J84112 Idiopathic pulmonary fibrosis: Secondary | ICD-10-CM | POA: Diagnosis not present

## 2020-07-02 DIAGNOSIS — Z79899 Other long term (current) drug therapy: Secondary | ICD-10-CM | POA: Diagnosis not present

## 2020-07-02 DIAGNOSIS — M5136 Other intervertebral disc degeneration, lumbar region: Secondary | ICD-10-CM | POA: Diagnosis not present

## 2020-07-02 DIAGNOSIS — G894 Chronic pain syndrome: Secondary | ICD-10-CM | POA: Diagnosis not present

## 2020-07-29 ENCOUNTER — Ambulatory Visit: Payer: Medicaid Other | Admitting: Family Medicine

## 2020-08-01 DIAGNOSIS — J84112 Idiopathic pulmonary fibrosis: Secondary | ICD-10-CM | POA: Diagnosis not present

## 2020-08-01 DIAGNOSIS — J439 Emphysema, unspecified: Secondary | ICD-10-CM | POA: Diagnosis not present

## 2020-08-01 DIAGNOSIS — R051 Acute cough: Secondary | ICD-10-CM | POA: Diagnosis not present

## 2020-08-05 ENCOUNTER — Ambulatory Visit: Payer: Medicaid Other | Admitting: Family Medicine

## 2020-08-07 ENCOUNTER — Ambulatory Visit: Payer: Medicaid Other | Admitting: Family Medicine

## 2020-08-12 DIAGNOSIS — M5136 Other intervertebral disc degeneration, lumbar region: Secondary | ICD-10-CM | POA: Diagnosis not present

## 2020-08-12 DIAGNOSIS — Z9189 Other specified personal risk factors, not elsewhere classified: Secondary | ICD-10-CM | POA: Diagnosis not present

## 2020-08-12 DIAGNOSIS — Z79899 Other long term (current) drug therapy: Secondary | ICD-10-CM | POA: Diagnosis not present

## 2020-08-12 DIAGNOSIS — G894 Chronic pain syndrome: Secondary | ICD-10-CM | POA: Diagnosis not present

## 2020-08-13 ENCOUNTER — Ambulatory Visit: Payer: Medicaid Other | Admitting: Family Medicine

## 2020-08-21 ENCOUNTER — Other Ambulatory Visit: Payer: Self-pay | Admitting: *Deleted

## 2020-08-21 ENCOUNTER — Other Ambulatory Visit: Payer: Self-pay

## 2020-08-21 DIAGNOSIS — Z139 Encounter for screening, unspecified: Secondary | ICD-10-CM

## 2020-08-21 NOTE — Patient Instructions (Signed)
Visit Information  Bradley Rice was given information about Medicaid Managed Care team care coordination services as a part of their [facility] Medicaid benefit. Bradley Rice verbally consented to engagement with the [facility] Managed Care team.   For questions related to your Healthy [facility] health plan, please call: [phone] or visit the homepage here: [email or web address]  If you would like to schedule transportation through your Healthy [facility] plan, please call the following number at least 2 days in advance of your appointment: [phone]  Bradley Rice - following are the goals we discussed in your visit today:  Goals Addressed            This Visit's Progress   . Find Help in My Community       Timeframe:  Long-Range Goal Priority:  Medium Start Date:    08/21/20                         Expected End Date:  10/20/20                    - call 72 when I need some help - follow-up on any referrals for help I am given - make a list of family or friends that I can call    Why is this important?    Knowing how and where to find help for yourself or family in your neighborhood and community is an important skill.   You will want to take some steps to learn how.        . Make and Keep All Appointments       Timeframe:  Short-Term Goal Priority:  Medium Start Date:08/21/20                             Expected End Date:  09/21/20                     - call and schedule appointment with PCP for headache evaluation - call and schedule follow up with the Pulmonologist  - call to cancel if needed - keep a calendar with appointment dates    Why is this important?    Part of staying healthy is seeing the doctor for follow-up care.   If you forget your appointments, there are some things you can do to stay on track.              Telephone follow up appointment with Managed Medicaid care management team member scheduled  for:09/21/20 @ 9am  Bradley Montane, RN  Following is a copy of your plan of care:  Patient Care Plan: Health Management    Problem Identified: Health Promotion or Disease Self-Management (General Plan of Care)     Long-Range Goal: Self-Management Plan Developed   Start Date: 08/21/2020  Expected End Date: 10/20/2020  This Visit's Progress: On track  Priority: Medium  Note:   Current Barriers:  . Chronic Disease Management support and education needs related to Pulmonary Fibrosis-Bradley Rice lives with his wife. He is on oxygen continuously, has a power chair, is seen at [facility] via tele visits monthly for chronic back pain. He is reporting a headache on and off since Thanksgiving and has been unable to see his PCP due to cancelled appointments(he was feeling bad for one appointment and office had to cancel two appointments) Since the pandemic he has avoided going  out in public and tried to make all appointments virtual or by phone.  . Film/video editor.  Bradley Rice barriers  Nurse Case Manager Clinical Goal(s):  Marland Kitchen Over the next 30 days, patient will verbalize understanding of plan for health management . Over the next 30 days, patient will call and schedule follow up appointments with PCP and Pulmonology . Over the next 30 days, patient will meet with RN Care Manager to address health management needs . Over the next 30 days, patient will work with CM team pharmacist to review medications and discuss medications that insurance does not cover . Over the next 30 days, patient will work with [place] for food resources  Interventions:  . Inter-disciplinary care team collaboration (see longitudinal plan of care) . Evaluation of current treatment plan related to pulmonary fibrosis and patient's adherence to plan as established by provider. . Advised patient to call PCP today for appointment to address headaches, cool compress, dark room and limit screen time . Reviewed  medications with patient and discussed taking all medications as prescribed. He reports that insurance is no longer covering his pain medication. He is paying out of pocket . Care Guide referral for food insecurity . Pharmacy referral for medication review and assistance with covering pain medication . Call Healthy [facility] [phone] to inquire about assistance for grab bars.  Patient Goals/Self-Care Activities Over the next 30 days, patient will: - call and schedule appointment with PCP for headache evaluation - call and schedule follow up with the Pulmonologist  - call to cancel if needed - keep a calendar with appointment dates  - call [phone] when I need some help - follow-up on any referrals for help I am given - make a list of family or friends that I can call     Follow Up Plan: Telephone follow up appointment with Managed Medicaid care management team member scheduled for:09/21/20 @ 9am

## 2020-08-21 NOTE — Patient Outreach (Signed)
Medicaid Managed Care   Nurse Care Manager Note  08/21/2020 Name:  Bradley Rice MRN:  [id number] DOB:  Jun 13, 1961  Bradley Rice is an 60 y.o. year old male who is a primary patient of Bradley Balloon, FNP.  The [facility] Managed Care Coordination team was consulted for assistance with:    Pulmonary Disease  Bradley Rice was given information about Medicaid Managed Care Coordination team services today. Bradley Rice agreed to services and verbal consent obtained.  Engaged with patient by telephone for initial visit in response to provider referral for case management and/or care coordination services.   Assessments/Interventions:  Review of past medical history, allergies, medications, health status, including review of consultants reports, laboratory and other test data, was performed as part of comprehensive evaluation and provision of chronic care management services.  SDOH (Social Determinants of Health) assessments and interventions performed:   Care Plan  Allergies  Allergen Reactions  . Pirfenidone Nausea And Vomiting and Other (See Comments)    Weight loss    Medications Reviewed Today    Reviewed by Bradley Montane, RN (Registered Nurse) on 08/21/20 at 1328  Med List Status: <None>  Medication Order Taking? Sig Documenting Provider Last Dose Status Informant  albuterol (PROVENTIL HFA;VENTOLIN HFA) 108 (90 Base) MCG/ACT inhaler [id number] Yes Inhale 2 puffs into the lungs every 6 (six) hours as needed for wheezing or shortness of breath. Bradley Balloon, FNP Taking Active Self  albuterol (PROVENTIL) (2.5 MG/3ML) 0.083% nebulizer solution [id number] Yes Take 3 mLs (2.5 mg total) by nebulization every 6 (six) hours as needed for wheezing or shortness of breath. Bradley Balloon, FNP Taking Active Self  aspirin EC 81 MG tablet [id number] Yes Take 81 mg by mouth daily. [provider] Taking Active   budesonide-formoterol (SYMBICORT) 160-4.5 MCG/ACT inhaler [id number] Yes Inhale 2  puffs into the lungs 2 (two) times daily. Bradley Males, MD Taking Active   feeding supplement (ENSURE ENLIVE / ENSURE PLUS) LIQD [id number] Yes Take 237 mLs by mouth 2 (two) times daily between meals. Bradley Balloon, FNP Taking Active   methocarbamol (ROBAXIN) 750 MG tablet [id number] Yes Take 1 tablet (750 mg total) by mouth 2 (two) times daily as needed. Bradley Balloon, FNP Taking Active   metoprolol succinate (TOPROL-XL) 50 MG 24 hr tablet [id number] Yes Take 1 tablet (50 mg total) by mouth daily. Bradley Balloon, FNP Taking Active Self  Nintedanib (OFEV) 150 MG CAPS [id number] Yes Take 1 capsule (150 mg total) by mouth 2 (two) times daily. Bradley Males, MD Taking Active   ondansetron [facility]) 4 MG tablet [id number] Yes Take 1 tablet (4 mg total) by mouth 3 (three) times daily. Bradley Males, MD Taking Active   oxyCODONE (ROXICODONE) 15 MG immediate release tablet [id number] Yes Take 15 mg by mouth 4 (four) times daily. [provider] Taking Active   sildenafil (REVATIO) 20 MG tablet [id number] No Take 2-5 tabs PRN prior to sexual activity.  Patient not taking: Reported on 08/21/2020   [provider] Not Taking Active   tiotropium (SPIRIVA) 18 MCG inhalation capsule [id number] Yes Place 1 capsule (18 mcg total) into inhaler and inhale daily. Bradley Males, MD Taking Active   Med List Note Lisette Abu 04/18/16 1827): inhaler          Patient Active Problem List   Diagnosis Date Noted  . Chronic respiratory failure with hypoxia ([place]) 01/28/2020  . Headache 12/10/2019  . Emphysema lung ([place]) 12/10/2019  .  Bronchitis 08/16/2018  . Apnea 07/15/2018  . IPF (idiopathic pulmonary fibrosis) ([place]) 07/15/2018  . Pulmonary nodules 07/15/2018  . Chronic back pain 04/30/2018  . Chronic pain 03/15/2018  . Hypertension 09/30/2016  . ALS (amyotrophic lateral sclerosis) ([place])   . GAD (generalized anxiety disorder) 02/17/2015  . Depression 02/17/2015  .  Current smoker 02/17/2015  . Postlaminectomy syndrome, cervical region 08/15/2014  . Cervical dystonia 06/13/2014  . Cervicogenic headache 05/09/2014  . TIA (transient ischemic attack) 12/28/2013  . Cervical spondylosis with radiculopathy 12/17/2013  . ED (erectile dysfunction) 11/21/2013  . Multilevel spondylosis 06/25/2012  . 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 Carpal tunnel syndrome of left wrist 06/25/2012    Class: Chronic    Conditions to be addressed/monitored per PCP order:  Pulmonary Disease  Care Plan : Health Management  Updates made by Bradley Montane, RN since 08/21/2020 12:00 AM    Problem: Health Promotion or Disease Self-Management (General Plan of Care)     Long-Range Goal: Self-Management Plan Developed   Start Date: 08/21/2020  Expected End Date: 10/20/2020  This Visit's Progress: On track  Priority: Medium  Note:   Current Barriers:  . Chronic Disease Management support and education needs related to Pulmonary Fibrosis-[name] lives with his wife. He is on oxygen continuously, has a power chair, is seen at [facility] via tele visits monthly for chronic back pain. He is reporting a headache on and off since Thanksgiving and has been unable to see his PCP due to cancelled appointments(he was feeling bad for one appointment and office had to cancel two appointments) Since the pandemic he has avoided going out in public and tried to make all appointments virtual or by phone.  . Film/video editor.  Bradley Rice barriers  Nurse Case Manager Clinical Goal(s):  Marland Kitchen Over the next 30 days, patient will verbalize understanding of plan for health management . Over the next 30 days, patient will call and schedule follow up appointments with PCP and Pulmonology . Over the next 30 days, patient will meet with RN Care Manager to address health management needs . Over the next 30 days, patient will work with CM team pharmacist to review medications and discuss  medications that insurance does not cover . Over the next 30 days, patient will work with [place] for food resources  Interventions:  . Inter-disciplinary care team collaboration (see longitudinal plan of care) . Evaluation of current treatment plan related to pulmonary fibrosis and patient's adherence to plan as established by provider. . Advised patient to call PCP today for appointment to address headaches, cool compress, dark room and limit screen time . Reviewed medications with patient and discussed taking all medications as prescribed. He reports that insurance is no longer covering his pain medication. He is paying out of pocket . Care Guide referral for food insecurity . Pharmacy referral for medication review and assistance with covering pain medication . Call Healthy [facility] [phone] to inquire about assistance for grab bars.  Patient Goals/Self-Care Activities Over the next 30 days, patient will: - call and schedule appointment with PCP for headache evaluation - call and schedule follow up with the Pulmonologist  - call to cancel if needed - keep a calendar with appointment dates  - call 211 when I need some help - follow-up on any referrals for help I am given - make a list of family or friends that I can call     Follow Up Plan: Telephone follow up appointment with  Managed Medicaid care management team member scheduled for:09/21/20 @ 9am        Follow Up:  Patient agrees to Care Plan and Follow-up.  Plan: The Managed Medicaid care management team will reach out to the patient again over the next 30 days.  Date/time of next scheduled RN care management/care coordination outreach: 09/21/20 @ [place] RN, [place] RN Care Coordinator

## 2020-08-25 ENCOUNTER — Telehealth: Payer: Self-pay | Admitting: Family

## 2020-08-25 NOTE — Telephone Encounter (Signed)
° °  Telephone encounter was:  Unsuccessful.  08/25/2020 Name: Bradley Rice MRN: [id number] DOB: 08-21-1960  Unsuccessful outbound call made today to assist with:  Food Insecurity  Outreach Attempt:  1st Attempt  A HIPAA compliant voice message was left requesting a return call.  Instructed patient to call back at [phone].  [place], Care Management Phone: [phone] Email: [email or web address]

## 2020-08-27 ENCOUNTER — Telehealth: Payer: Self-pay | Admitting: Family

## 2020-08-27 NOTE — Telephone Encounter (Signed)
   Telephone encounter was:  Unsuccessful.  08/27/2020 Name: ROMNEY COMPEAN MRN: [id number] DOB: 11-14-1960  Unsuccessful outbound call made today to assist with:  Food Insecurity  Outreach Attempt:  2nd Attempt  A HIPAA compliant voice message was left requesting a return call.  Instructed patient to call back at [phone].  [place], Care Management Phone: [phone] Email: [email or web address]

## 2020-08-28 ENCOUNTER — Other Ambulatory Visit: Payer: Self-pay

## 2020-08-31 ENCOUNTER — Telehealth: Payer: Self-pay | Admitting: Family

## 2020-08-31 NOTE — Telephone Encounter (Signed)
   Telephone encounter was:  Unsuccessful.  08/31/2020 Name: Bradley Rice MRN: [id number] DOB: 24-Jul-1961  Unsuccessful outbound call made today to assist with:  Food Insecurity  Outreach Attempt:  3rd Attempt.  Referral closed unable to contact patient.  A HIPAA compliant voice message was left requesting a return call.  Instructed patient to call back at [phone].  [place], Care Management Phone: [phone] Email: [email or web address]

## 2020-09-01 DIAGNOSIS — J439 Emphysema, unspecified: Secondary | ICD-10-CM | POA: Diagnosis not present

## 2020-09-01 DIAGNOSIS — J84112 Idiopathic pulmonary fibrosis: Secondary | ICD-10-CM | POA: Diagnosis not present

## 2020-09-01 DIAGNOSIS — R051 Acute cough: Secondary | ICD-10-CM | POA: Diagnosis not present

## 2020-09-02 ENCOUNTER — Other Ambulatory Visit: Payer: Self-pay

## 2020-09-07 ENCOUNTER — Other Ambulatory Visit: Payer: Self-pay

## 2020-09-07 NOTE — Patient Outreach (Signed)
Unable to contact for 2nd time. Will call one more time next week. Left VM for him to call me back

## 2020-09-14 ENCOUNTER — Other Ambulatory Visit: Payer: Self-pay

## 2020-09-14 NOTE — Patient Outreach (Signed)
Called 3 times to get in contact with patient. Left VM's each time.  Told patient this will be my last call, if he has any issues he can reach me and I left him my phone number.   Will F/U if patient calls me back

## 2020-09-21 ENCOUNTER — Other Ambulatory Visit: Payer: Self-pay | Admitting: *Deleted

## 2020-09-21 NOTE — Patient Outreach (Signed)
Care Coordination  09/21/2020  Bradley Rice Dec 30, 1960 [id number]    Medicaid Managed Care   Unsuccessful Outreach Note  09/21/2020 Name: Bradley Rice MRN: [id number] DOB: [birth year]  Referred by: Sharion Balloon, FNP Reason for referral : High Risk Managed Medicaid (Unsuccessful RNCM follow up outreach)   An unsuccessful telephone outreach was attempted today. The patient was referred to the case management team for assistance with care management and care coordination.   Follow Up Plan: A HIPAA compliant phone message was left for the patient providing contact information and requesting a return call.  The care management team will reach out to the patient again over the next 7-14 days.   Lurena Joiner RN, BSN   [employer] manager

## 2020-09-21 NOTE — Patient Instructions (Signed)
Visit Information  Mr. Bradley Rice  - as a part of your Medicaid benefit, you are eligible for care management and care coordination services at no cost or copay. I was unable to reach you by phone today but would be happy to help you with your health related needs. Please feel free to call me @ [phone].   A member of the Managed Medicaid care management team will reach out to you again over the next 7-14 days.   Lurena Joiner RN, BSN [place]  [employer] manager

## 2020-09-29 DIAGNOSIS — Z79899 Other long term (current) drug therapy: Secondary | ICD-10-CM | POA: Diagnosis not present

## 2020-09-29 DIAGNOSIS — J84112 Idiopathic pulmonary fibrosis: Secondary | ICD-10-CM | POA: Diagnosis not present

## 2020-09-29 DIAGNOSIS — J439 Emphysema, unspecified: Secondary | ICD-10-CM | POA: Diagnosis not present

## 2020-09-29 DIAGNOSIS — R051 Acute cough: Secondary | ICD-10-CM | POA: Diagnosis not present

## 2020-10-02 ENCOUNTER — Other Ambulatory Visit: Payer: Self-pay | Admitting: *Deleted

## 2020-10-02 DIAGNOSIS — J8410 Pulmonary fibrosis, unspecified: Secondary | ICD-10-CM

## 2020-10-02 MED ORDER — OFEV 150 MG PO CAPS: 150.0000 mg | ORAL_CAPSULE | Freq: Two times a day (BID) | ORAL | 3 refills | Status: DC

## 2020-10-20 ENCOUNTER — Encounter: Payer: Medicaid Other | Admitting: Family

## 2020-10-20 ENCOUNTER — Telehealth: Payer: Self-pay

## 2020-10-20 NOTE — Telephone Encounter (Signed)
Pt informed to rest, eat bland foods as tolerated and clear liquids ( water, ginger ale). Avoid sugar.  He can take Meclizine, Dramamine, pepto if needed. Informed pt that symptoms will have to run its course. If he did feel to weak and tired and not keeping anything down after 24 hours he may need to go to the ER.  Pt understood.

## 2020-10-30 DIAGNOSIS — J439 Emphysema, unspecified: Secondary | ICD-10-CM | POA: Diagnosis not present

## 2020-10-30 DIAGNOSIS — Z79899 Other long term (current) drug therapy: Secondary | ICD-10-CM | POA: Diagnosis not present

## 2020-10-30 DIAGNOSIS — G894 Chronic pain syndrome: Secondary | ICD-10-CM | POA: Diagnosis not present

## 2020-10-30 DIAGNOSIS — M5136 Other intervertebral disc degeneration, lumbar region: Secondary | ICD-10-CM | POA: Diagnosis not present

## 2020-10-30 DIAGNOSIS — Z9189 Other specified personal risk factors, not elsewhere classified: Secondary | ICD-10-CM | POA: Diagnosis not present

## 2020-10-30 DIAGNOSIS — J84112 Idiopathic pulmonary fibrosis: Secondary | ICD-10-CM | POA: Diagnosis not present

## 2020-10-30 DIAGNOSIS — R051 Acute cough: Secondary | ICD-10-CM | POA: Diagnosis not present

## 2020-11-03 ENCOUNTER — Encounter: Payer: Self-pay | Admitting: Family

## 2020-11-03 ENCOUNTER — Other Ambulatory Visit: Payer: Self-pay

## 2020-11-03 ENCOUNTER — Ambulatory Visit (INDEPENDENT_AMBULATORY_CARE_PROVIDER_SITE_OTHER): Payer: Medicaid Other

## 2020-11-03 ENCOUNTER — Ambulatory Visit (INDEPENDENT_AMBULATORY_CARE_PROVIDER_SITE_OTHER): Payer: Medicaid Other | Admitting: Family

## 2020-11-03 VITALS — BP 128/69 | HR 95 | Temp 98.2°F | Ht 72.0 in | Wt 154.4 lb

## 2020-11-03 DIAGNOSIS — M960 Pseudarthrosis after fusion or arthrodesis: Secondary | ICD-10-CM

## 2020-11-03 DIAGNOSIS — I10 Essential (primary) hypertension: Secondary | ICD-10-CM | POA: Diagnosis not present

## 2020-11-03 DIAGNOSIS — Z87891 Personal history of nicotine dependence: Secondary | ICD-10-CM

## 2020-11-03 DIAGNOSIS — Z1159 Encounter for screening for other viral diseases: Secondary | ICD-10-CM | POA: Diagnosis not present

## 2020-11-03 DIAGNOSIS — G8929 Other chronic pain: Secondary | ICD-10-CM

## 2020-11-03 DIAGNOSIS — J439 Emphysema, unspecified: Secondary | ICD-10-CM

## 2020-11-03 DIAGNOSIS — G4486 Cervicogenic headache: Secondary | ICD-10-CM

## 2020-11-03 DIAGNOSIS — M542 Cervicalgia: Secondary | ICD-10-CM | POA: Diagnosis not present

## 2020-11-03 DIAGNOSIS — Z114 Encounter for screening for human immunodeficiency virus [HIV]: Secondary | ICD-10-CM | POA: Diagnosis not present

## 2020-11-03 DIAGNOSIS — Z1211 Encounter for screening for malignant neoplasm of colon: Secondary | ICD-10-CM | POA: Diagnosis not present

## 2020-11-03 MED ORDER — METOPROLOL SUCCINATE ER 50 MG PO TB24: 50.0000 mg | ORAL_TABLET | Freq: Every day | ORAL | 1 refills | Status: DC

## 2020-11-03 MED ORDER — METHOCARBAMOL 750 MG PO TABS: 750.0000 mg | ORAL_TABLET | Freq: Two times a day (BID) | ORAL | 1 refills | Status: DC | PRN

## 2020-11-03 MED ORDER — ALBUTEROL SULFATE (2.5 MG/3ML) 0.083% IN NEBU: 2.5000 mg | INHALATION_SOLUTION | Freq: Four times a day (QID) | RESPIRATORY_TRACT | 1 refills | Status: DC | PRN

## 2020-11-03 MED ORDER — TIOTROPIUM BROMIDE MONOHYDRATE 18 MCG IN CAPS: 18.0000 ug | ORAL_CAPSULE | Freq: Every day | RESPIRATORY_TRACT | 6 refills | Status: DC

## 2020-11-03 MED ORDER — SILDENAFIL CITRATE 100 MG PO TABS: 100.0000 mg | ORAL_TABLET | Freq: Every day | ORAL | 2 refills | Status: DC | PRN

## 2020-11-03 MED ORDER — ALBUTEROL SULFATE HFA 108 (90 BASE) MCG/ACT IN AERS: 2.0000 | INHALATION_SPRAY | Freq: Four times a day (QID) | RESPIRATORY_TRACT | 5 refills | Status: DC | PRN

## 2020-11-03 MED ORDER — BUDESONIDE-FORMOTEROL FUMARATE 160-4.5 MCG/ACT IN AERO: 2.0000 | INHALATION_SPRAY | Freq: Two times a day (BID) | RESPIRATORY_TRACT | 3 refills | Status: DC

## 2020-11-03 NOTE — Progress Notes (Signed)
Subjective:    Patient ID: Bradley Rice, male    DOB: [birth year], 60 y.o.   MRN: [id number]  Chief Complaint  Patient presents with  . Annual Exam    HA FOR 2 WEEKS DR. Donovan Kail THINKS IT IS FROM A PLATE IN HIS NECK THAT HAS MOVED THEY WOULD LIKE XRAYS NEEDED.    Pt presents to the office today for CPE. He is followed by Pain clinic every 4 months. She is followed by Pulmonologist for idiopathic pulmonary fibrosis.  Neck Pain  This is a chronic problem. The current episode started more than 1 year ago. The problem occurs intermittently. The problem has been waxing and waning. The pain is present in the left side and right side. The pain is at a severity of 10/10. The pain is moderate. The symptoms are aggravated by twisting. He has tried NSAIDs for the symptoms. The treatment provided mild relief.  Hypertension This is a chronic problem. The current episode started more than 1 year ago. The problem has been resolved since onset. The problem is controlled. Associated symptoms include neck pain. Pertinent negatives include no malaise/fatigue, peripheral edema or shortness of breath. The current treatment provides moderate improvement.  COPD  Quit smoking 2 years. States his breathing is stable, but uses 2 l oxygen at night and when exerting. Uses symbicort BID and Spiriva daily. States he uses the albuterol 2-3 times a week.     Review of Systems  Constitutional: Negative for malaise/fatigue.  Respiratory: Negative for shortness of breath.   Musculoskeletal: Positive for neck pain.  All other systems reviewed and are negative.      Objective:   Physical Exam Vitals reviewed.  Constitutional:      General: He is not in acute distress.    Appearance: He is well-developed.  HENT:     Head: Normocephalic.     Right Ear: Tympanic membrane normal.     Left Ear: Tympanic membrane normal.  Eyes:     General:        Right eye: No discharge.        Left eye: No discharge.     Pupils:  Pupils are equal, round, and reactive to light.  Neck:     Thyroid: No thyromegaly.  Cardiovascular:     Rate and Rhythm: Normal rate and regular rhythm.     Heart sounds: Normal heart sounds. No murmur heard.   Pulmonary:     Effort: Pulmonary effort is normal. No respiratory distress.     Breath sounds: Rhonchi present. No wheezing.  Abdominal:     General: Bowel sounds are normal. There is no distension.     Palpations: Abdomen is soft.     Tenderness: There is no abdominal tenderness.  Musculoskeletal:        General: Tenderness present.     Cervical back: Normal range of motion and neck supple.     Comments: Pain in posterior cervical with flexion and extension  Skin:    General: Skin is warm and dry.     Findings: No erythema or rash.  Neurological:     Mental Status: He is alert and oriented to person, place, and time.     Cranial Nerves: No cranial nerve deficit.     Deep Tendon Reflexes: Reflexes are normal and symmetric.  Psychiatric:        Behavior: Behavior normal.        Thought Content: Thought content normal.  Judgment: Judgment normal.       BP 128/69   Pulse 95   Temp 98.2 F (36.8 C) (Temporal)   Ht 6' (1.829 m)   Wt 154 lb 6.4 oz (70 kg)   SpO2 97%   BMI 20.94 kg/m      Assessment & Plan:  Bradley Rice comes in today with chief complaint of Annual Exam (HA FOR 2 WEEKS DR. Donovan Kail THINKS IT IS FROM A PLATE IN HIS NECK THAT HAS MOVED THEY WOULD LIKE XRAYS NEEDED. )   Diagnosis and orders addressed:  1. Essential hypertension - metoprolol succinate (TOPROL-XL) 50 MG 24 hr tablet; Take 1 tablet (50 mg total) by mouth daily.  Dispense: 90 tablet; Refill: 1 - CMP14+EGFR - CBC with Differential/Platelet  2. Pulmonary emphysema, unspecified emphysema type ([place]) - CMP14+EGFR - CBC with Differential/Platelet - DG Chest 2 View; Future  3. Primary hypertension - CMP14+EGFR - CBC with Differential/Platelet  4. Failed cervical fusion -  CMP14+EGFR - CBC with Differential/Platelet - DG Cervical Spine Complete; Future  5. Cervicogenic headache - CMP14+EGFR - CBC with Differential/Platelet - DG Cervical Spine Complete; Future  6. Hx of smoking - CMP14+EGFR - CBC with Differential/Platelet - DG Chest 2 View; Future  7. Other chronic pain - CMP14+EGFR - CBC with Differential/Platelet  8. Neck pain - CMP14+EGFR - CBC with Differential/Platelet - DG Cervical Spine Complete; Future  9. Colon cancer screening - Cologuard  10. Need for hepatitis C screening test - Hepatitis C antibody  11. Encounter for screening for HIV - HIV Antibody (routine testing w rflx)   Labs pending Health Maintenance reviewed Diet and exercise encouraged  Follow up plan: 6 months    Evelina Dun, FNP

## 2020-11-03 NOTE — Patient Instructions (Signed)

## 2020-11-04 LAB — CMP14+EGFR
ALT: 16 IU/L (ref 0–44)
AST: 18 IU/L (ref 0–40)
Albumin/Globulin Ratio: 1.2 (ref 1.2–2.2)
Albumin: 4.6 g/dL (ref 3.8–4.9)
Alkaline Phosphatase: 101 IU/L (ref 44–121)
BUN/Creatinine Ratio: 8 — ABNORMAL LOW (ref 9–20)
BUN: 6 mg/dL (ref 6–24)
Bilirubin Total: 0.4 mg/dL (ref 0.0–1.2)
CO2: 25 mmol/L (ref 20–29)
Calcium: 9.9 mg/dL (ref 8.7–10.2)
Chloride: 99 mmol/L (ref 96–106)
Creatinine, Ser: 0.77 mg/dL (ref 0.76–1.27)
Globulin, Total: 3.9 g/dL (ref 1.5–4.5)
Glucose: 81 mg/dL (ref 65–99)
Potassium: 4.4 mmol/L (ref 3.5–5.2)
Sodium: 141 mmol/L (ref 134–144)
Total Protein: 8.5 g/dL (ref 6.0–8.5)
eGFR: 104 mL/min/{1.73_m2} (ref >59)

## 2020-11-04 LAB — CBC WITH DIFFERENTIAL/PLATELET
Basophils Absolute: 0.2 10*3/uL (ref 0.0–0.2)
Basos: 1 %
EOS (ABSOLUTE): 0.3 10*3/uL (ref 0.0–0.4)
Eos: 3 %
Hematocrit: 49.1 % (ref 37.5–51.0)
Hemoglobin: 15.9 g/dL (ref 13.0–17.7)
Immature Grans (Abs): 0.1 10*3/uL (ref 0.0–0.1)
Immature Granulocytes: 1 %
Lymphocytes Absolute: 2.8 10*3/uL (ref 0.7–3.1)
Lymphs: 22 %
MCH: 31.4 pg (ref 26.6–33.0)
MCHC: 32.4 g/dL (ref 31.5–35.7)
MCV: 97 fL (ref 79–97)
Monocytes Absolute: 1 10*3/uL — ABNORMAL HIGH (ref 0.1–0.9)
Monocytes: 8 %
Neutrophils Absolute: 8.6 10*3/uL — ABNORMAL HIGH (ref 1.4–7.0)
Neutrophils: 65 %
Platelets: 357 10*3/uL (ref 150–450)
RBC: 5.07 x10E6/uL (ref 4.14–5.80)
RDW: 12.5 % (ref 11.6–15.4)
WBC: 13 10*3/uL — ABNORMAL HIGH (ref 3.4–10.8)

## 2020-11-04 LAB — HIV ANTIBODY (ROUTINE TESTING W REFLEX): HIV Screen 4th Generation wRfx: NONREACTIVE

## 2020-11-04 LAB — HEPATITIS C ANTIBODY: Hep C Virus Ab: <0.1 s/co ratio (ref 0.0–0.9)

## 2020-11-05 ENCOUNTER — Other Ambulatory Visit: Payer: Self-pay | Admitting: Family

## 2020-11-05 MED ORDER — AMOXICILLIN-POT CLAVULANATE 875-125 MG PO TABS
1.0000 | ORAL_TABLET | Freq: Two times a day (BID) | ORAL | 0 refills | Status: DC
Start: 2020-11-05 — End: 2020-12-18

## 2020-11-11 ENCOUNTER — Telehealth: Payer: Self-pay

## 2020-11-11 DIAGNOSIS — M960 Pseudarthrosis after fusion or arthrodesis: Secondary | ICD-10-CM

## 2020-11-11 DIAGNOSIS — G4486 Cervicogenic headache: Secondary | ICD-10-CM

## 2020-11-11 DIAGNOSIS — M4722 Other spondylosis with radiculopathy, cervical region: Secondary | ICD-10-CM

## 2020-11-11 NOTE — Telephone Encounter (Signed)
Patient aware and expressed understanding.  Patient states that surgery was originally done by Dr. Melina Schools, patient states that necks was not right after surgery and a follow up surgery was done by Dr. Basil Dess.  Patient states that he is unable to follow up with [place] because he retired and moved to [place]. Patient is open to any new provider you recommend.  Patient sees Jeanella Anton at [facility] for chronic pain.  Please review and advise

## 2020-11-11 NOTE — Telephone Encounter (Signed)
Chrity's patient   Patient was seen 11/03/20 and discussed neck pain - patient has chronic pain and history of surgery.  Patient states that he is still having pain and popping in his neck and it locked up on him yesterday - he went to an urgent care in [facility] and they informed him that it looked like on xrays done their one of the hardware pieces may be cracked and they have him in a cervical collar. They recommend follow up with a specialist and possible further advanced imaging.  Please review and advise.      Following is form OV note   Neck Pain  This is a chronic problem. The current episode started more than 1 year ago. The problem occurs intermittently. The problem has been waxing and waning. The pain is present in the left side and right side. The pain is at a severity of 10/10. The pain is moderate. The symptoms are aggravated by twisting. He has tried NSAIDs for the symptoms. The treatment provided mild relief.   Xray done at visit IMPRESSION: 1. No fracture, bone lesion or spondylolisthesis.  No acute finding. 2. Post fusion changes from the anterior cervical disc fusion at C4-C5 through C7-T1 are stable. 3. Degenerative changes as detailed.

## 2020-11-11 NOTE — Telephone Encounter (Signed)
Will defer to Christy's return tomorrow.  However, please make sure to ask if he has a preference for imaging location and ask who his surgeon is so we can make sure he has a referral in place for follow up.

## 2020-11-16 NOTE — Telephone Encounter (Signed)
MRI and referral to Neurosurgeon placed.

## 2020-11-16 NOTE — Addendum Note (Signed)
Addended by: Evelina Dun A on: 11/16/2020 07:54 AM   Modules accepted: Orders

## 2020-11-17 DIAGNOSIS — Z23 Encounter for immunization: Secondary | ICD-10-CM | POA: Diagnosis not present

## 2020-11-29 DIAGNOSIS — J84112 Idiopathic pulmonary fibrosis: Secondary | ICD-10-CM | POA: Diagnosis not present

## 2020-11-29 DIAGNOSIS — J439 Emphysema, unspecified: Secondary | ICD-10-CM | POA: Diagnosis not present

## 2020-11-29 DIAGNOSIS — R051 Acute cough: Secondary | ICD-10-CM | POA: Diagnosis not present

## 2020-12-01 DIAGNOSIS — Z79899 Other long term (current) drug therapy: Secondary | ICD-10-CM | POA: Diagnosis not present

## 2020-12-02 ENCOUNTER — Encounter: Payer: Self-pay | Admitting: Family Medicine

## 2020-12-09 ENCOUNTER — Telehealth: Payer: Self-pay

## 2020-12-09 NOTE — Telephone Encounter (Signed)
Pt called stating that he needs Alyse Low to send an order to [facility] for him to get four arm crutches. Says he is having a hard time using the regular crutches he has right now due to rotator cuff.  Please advise and call patient to confirm if order was sent or not.

## 2020-12-09 NOTE — Telephone Encounter (Signed)
Does patient need an appointment?

## 2020-12-10 NOTE — Telephone Encounter (Signed)
Patient aware and verbalized understanding. Appt made

## 2020-12-10 NOTE — Telephone Encounter (Signed)
Pt needs to be seen for this.

## 2020-12-11 ENCOUNTER — Ambulatory Visit (HOSPITAL_COMMUNITY)
Admission: RE | Admit: 2020-12-11 | Discharge: 2020-12-11 | Disposition: A | Discharge status: Home / Self Care | Payer: Medicaid Other | Source: Ambulatory Visit | Attending: Family | Admitting: Family

## 2020-12-11 DIAGNOSIS — G4486 Cervicogenic headache: Secondary | ICD-10-CM | POA: Diagnosis not present

## 2020-12-11 DIAGNOSIS — M4722 Other spondylosis with radiculopathy, cervical region: Secondary | ICD-10-CM | POA: Diagnosis not present

## 2020-12-11 DIAGNOSIS — M542 Cervicalgia: Secondary | ICD-10-CM | POA: Diagnosis not present

## 2020-12-11 DIAGNOSIS — M960 Pseudarthrosis after fusion or arthrodesis: Secondary | ICD-10-CM | POA: Diagnosis not present

## 2020-12-14 DIAGNOSIS — Z981 Arthrodesis status: Secondary | ICD-10-CM | POA: Diagnosis not present

## 2020-12-18 ENCOUNTER — Telehealth: Payer: Medicaid Other | Admitting: Family

## 2020-12-18 ENCOUNTER — Encounter: Payer: Self-pay | Admitting: Family

## 2020-12-18 DIAGNOSIS — M960 Pseudarthrosis after fusion or arthrodesis: Secondary | ICD-10-CM | POA: Diagnosis not present

## 2020-12-18 DIAGNOSIS — G5602 Carpal tunnel syndrome, left upper limb: Secondary | ICD-10-CM | POA: Diagnosis not present

## 2020-12-18 DIAGNOSIS — M159 Polyosteoarthritis, unspecified: Secondary | ICD-10-CM | POA: Diagnosis not present

## 2020-12-18 DIAGNOSIS — M25559 Pain in unspecified hip: Secondary | ICD-10-CM | POA: Diagnosis not present

## 2020-12-18 DIAGNOSIS — R531 Weakness: Secondary | ICD-10-CM

## 2020-12-18 DIAGNOSIS — G4486 Cervicogenic headache: Secondary | ICD-10-CM

## 2020-12-18 DIAGNOSIS — M545 Low back pain, unspecified: Secondary | ICD-10-CM

## 2020-12-18 DIAGNOSIS — G8929 Other chronic pain: Secondary | ICD-10-CM

## 2020-12-18 MED ORDER — CRUTCH-MATE ADULT FOREARM MISC
1.0000 | Freq: Every day | 0 refills | Status: DC
Start: 2020-12-18 — End: 2021-08-31

## 2020-12-18 NOTE — Progress Notes (Signed)
Virtual Visit  Note Due to COVID-19 pandemic this visit was conducted virtually. This visit type was conducted due to national recommendations for restrictions regarding the COVID-19 Pandemic (e.g. social distancing, sheltering in place) in an effort to limit this patient's exposure and mitigate transmission in our community. All issues noted in this document were discussed and addressed.  A physical exam was not performed with this format.  I connected with Bradley Rice on 12/18/20 at 12:02 pm  by video and verified that I am speaking with the correct person using two identifiers. Bradley Rice is currently located at home and no one is currently with him  during visit. The provider, Evelina Dun, FNP is located in their office at time of visit.  I discussed the limitations, risks, security and privacy concerns of performing an evaluation and management service by video and the availability of in person appointments. I also discussed with the patient that there may be a patient responsible charge related to this service. The patient expressed understanding and agreed to proceed.   History and Present Illness:  HPI Pt presents to the office today for prescription for forearm crutches. He reports he has ALS and uses a wheelchair at times and has chronic hip and leg pain of 7-8 out 10.   He is followed by Neurologists and he reports his Neurologists recommends he not use his wheelchair and tries to use crutches. However,  using the crutches causes "large swollen" areas in his axilla.   He is followed by the [facility] once a month.   Review of Systems  Constitutional: Positive for malaise/fatigue.  Musculoskeletal: Positive for back pain, joint pain, myalgias and neck pain.  Neurological: Positive for weakness.     Observations/Objective: No SOB or distress noted   Assessment and Plan: 1. Carpal tunnel syndrome of left wrist - Misc. Devices (CRUTCH-MATE ADULT FOREARM) MISC; 1  application by Does not apply route daily.  Dispense: 2 each; Refill: 0  2. Failed cervical fusion - Misc. Devices (CRUTCH-MATE ADULT FOREARM) MISC; 1 application by Does not apply route daily.  Dispense: 2 each; Refill: 0  3. Cervicogenic headache - Misc. Devices (CRUTCH-MATE ADULT FOREARM) MISC; 1 application by Does not apply route daily.  Dispense: 2 each; Refill: 0  4. Chronic bilateral low back pain, unspecified whether sciatica present - Misc. Devices (CRUTCH-MATE ADULT FOREARM) MISC; 1 application by Does not apply route daily.  Dispense: 2 each; Refill: 0  5. Weakness - Misc. Devices (CRUTCH-MATE ADULT FOREARM) MISC; 1 application by Does not apply route daily.  Dispense: 2 each; Refill: 0  6. Hip pain - Misc. Devices (CRUTCH-MATE ADULT FOREARM) MISC; 1 application by Does not apply route daily.  Dispense: 2 each; Refill: 0  7. Osteoarthritis of multiple joints, unspecified osteoarthritis type - Misc. Devices (CRUTCH-MATE ADULT FOREARM) MISC; 1 application by Does not apply route daily.  Dispense: 2 each; Refill: 0    Encourage ROM exercises Rest Keep all Neurologists appts    I discussed the assessment and treatment plan with the patient. The patient was provided an opportunity to ask questions and all were answered. The patient agreed with the plan and demonstrated an understanding of the instructions.   The patient was advised to call back or seek an in-person evaluation if the symptoms worsen or if the condition fails to improve as anticipated.  The above assessment and management plan was discussed with the patient. The patient verbalized understanding of and has agreed to  the management plan. Patient is aware to call the clinic if symptoms persist or worsen. Patient is aware when to return to the clinic for a follow-up visit. Patient educated on when it is appropriate to go to the emergency department.   Time call ended:  12:20 pm   I provided 18 minutes of    face-to-face time during this encounter.    Evelina Dun, FNP

## 2020-12-30 ENCOUNTER — Other Ambulatory Visit: Payer: Self-pay | Admitting: Neurosurgery

## 2020-12-30 DIAGNOSIS — J439 Emphysema, unspecified: Secondary | ICD-10-CM | POA: Diagnosis not present

## 2020-12-30 DIAGNOSIS — R051 Acute cough: Secondary | ICD-10-CM | POA: Diagnosis not present

## 2020-12-30 DIAGNOSIS — Z981 Arthrodesis status: Secondary | ICD-10-CM

## 2020-12-30 DIAGNOSIS — J84112 Idiopathic pulmonary fibrosis: Secondary | ICD-10-CM | POA: Diagnosis not present

## 2020-12-31 DIAGNOSIS — Z79899 Other long term (current) drug therapy: Secondary | ICD-10-CM | POA: Diagnosis not present

## 2021-01-15 ENCOUNTER — Ambulatory Visit
Admission: RE | Admit: 2021-01-15 | Discharge: 2021-01-15 | Disposition: A | Discharge status: Home / Self Care | Payer: Medicaid Other | Source: Ambulatory Visit | Attending: Neurosurgery | Admitting: Neurosurgery

## 2021-01-15 ENCOUNTER — Other Ambulatory Visit: Payer: Self-pay

## 2021-01-15 DIAGNOSIS — M50221 Other cervical disc displacement at C4-C5 level: Secondary | ICD-10-CM | POA: Diagnosis not present

## 2021-01-15 DIAGNOSIS — M5021 Other cervical disc displacement,  high cervical region: Secondary | ICD-10-CM | POA: Diagnosis not present

## 2021-01-15 DIAGNOSIS — Z981 Arthrodesis status: Secondary | ICD-10-CM

## 2021-01-15 DIAGNOSIS — J849 Interstitial pulmonary disease, unspecified: Secondary | ICD-10-CM | POA: Diagnosis not present

## 2021-01-15 DIAGNOSIS — M47812 Spondylosis without myelopathy or radiculopathy, cervical region: Secondary | ICD-10-CM | POA: Diagnosis not present

## 2021-01-29 ENCOUNTER — Telehealth: Payer: Self-pay | Admitting: Family

## 2021-01-29 DIAGNOSIS — R051 Acute cough: Secondary | ICD-10-CM | POA: Diagnosis not present

## 2021-01-29 DIAGNOSIS — J84112 Idiopathic pulmonary fibrosis: Secondary | ICD-10-CM | POA: Diagnosis not present

## 2021-01-29 DIAGNOSIS — Z981 Arthrodesis status: Secondary | ICD-10-CM | POA: Diagnosis not present

## 2021-01-29 DIAGNOSIS — I10 Essential (primary) hypertension: Secondary | ICD-10-CM | POA: Diagnosis not present

## 2021-01-29 DIAGNOSIS — M4722 Other spondylosis with radiculopathy, cervical region: Secondary | ICD-10-CM

## 2021-01-29 DIAGNOSIS — M159 Polyosteoarthritis, unspecified: Secondary | ICD-10-CM

## 2021-01-29 DIAGNOSIS — Z79899 Other long term (current) drug therapy: Secondary | ICD-10-CM | POA: Diagnosis not present

## 2021-01-29 DIAGNOSIS — J439 Emphysema, unspecified: Secondary | ICD-10-CM | POA: Diagnosis not present

## 2021-01-29 NOTE — Telephone Encounter (Signed)
Pt called stating that he just left an appt he had with Ortho that we sent him to. Pt says he did not have a good experience and wants a second opinion. Pt wants referral sent somewhere else.  Please advise and call patient.

## 2021-02-02 NOTE — Telephone Encounter (Signed)
New referral sent.   Evelina Dun, FNP

## 2021-02-16 DIAGNOSIS — Z981 Arthrodesis status: Secondary | ICD-10-CM | POA: Diagnosis not present

## 2021-02-16 DIAGNOSIS — M542 Cervicalgia: Secondary | ICD-10-CM | POA: Diagnosis not present

## 2021-02-16 DIAGNOSIS — M5030 Other cervical disc degeneration, unspecified cervical region: Secondary | ICD-10-CM | POA: Diagnosis not present

## 2021-02-19 ENCOUNTER — Ambulatory Visit: Payer: Self-pay | Admitting: Orthopedic Surgery

## 2021-02-22 ENCOUNTER — Encounter: Payer: Self-pay | Admitting: Family

## 2021-02-22 ENCOUNTER — Other Ambulatory Visit: Payer: Self-pay

## 2021-02-22 ENCOUNTER — Ambulatory Visit: Payer: Medicaid Other | Admitting: Family

## 2021-02-22 VITALS — BP 97/68 | HR 54 | Temp 97.8°F | Ht 72.0 in | Wt 153.0 lb

## 2021-02-22 DIAGNOSIS — Z23 Encounter for immunization: Secondary | ICD-10-CM

## 2021-02-22 DIAGNOSIS — Z01818 Encounter for other preprocedural examination: Secondary | ICD-10-CM

## 2021-02-22 DIAGNOSIS — M542 Cervicalgia: Secondary | ICD-10-CM

## 2021-02-22 DIAGNOSIS — M4722 Other spondylosis with radiculopathy, cervical region: Secondary | ICD-10-CM

## 2021-02-22 DIAGNOSIS — G8929 Other chronic pain: Secondary | ICD-10-CM

## 2021-02-22 DIAGNOSIS — L853 Xerosis cutis: Secondary | ICD-10-CM

## 2021-02-22 MED ORDER — AMMONIUM LACTATE 12 % EX LOTN: 1.0000 "application " | TOPICAL_LOTION | CUTANEOUS | 1 refills | Status: DC | PRN

## 2021-02-22 NOTE — Progress Notes (Signed)
Subjective:    Patient ID: Bradley Rice, male    DOB: 06/21/1961, 60 y.o.   MRN: [id number]  Chief Complaint  Patient presents with   surgical clearance   Pt presents to the office today for surgical clearance for anterior cervical decompression/discectomy fusion C3-5.   He has an appointment with ENT on 03/11/21 to be cleared. He has an appointment with his Pulmonologist today.    Pt is complaining of dry skin on his entire body since starting Ofev.  Neck Pain  This is a chronic problem. The current episode started more than 1 year ago. The problem occurs intermittently. The problem has been waxing and waning. The pain is at a severity of 10/10. The pain is moderate. The symptoms are aggravated by twisting and bending. Associated symptoms include headaches. Pertinent negatives include no numbness, pain with swallowing, photophobia, trouble swallowing or visual change. He has tried acetaminophen for the symptoms. The treatment provided mild relief.     Review of Systems  HENT:  Negative for trouble swallowing.   Eyes:  Negative for photophobia.  Musculoskeletal:  Positive for neck pain.  Neurological:  Positive for headaches. Negative for numbness.  All other systems reviewed and are negative.     Objective:   Physical Exam Vitals reviewed.  Constitutional:      General: He is not in acute distress.    Appearance: He is well-developed.  HENT:     Head: Normocephalic.     Right Ear: Tympanic membrane normal.     Left Ear: Tympanic membrane normal.  Eyes:     General:        Right eye: No discharge.        Left eye: No discharge.     Pupils: Pupils are equal, round, and reactive to light.  Neck:     Thyroid: No thyromegaly.  Cardiovascular:     Rate and Rhythm: Normal rate and regular rhythm.     Heart sounds: Normal heart sounds. No murmur heard. Pulmonary:     Effort: Pulmonary effort is normal. No respiratory distress.     Breath sounds: Normal breath sounds. No  wheezing.  Abdominal:     General: Bowel sounds are normal. There is no distension.     Palpations: Abdomen is soft.     Tenderness: There is no abdominal tenderness.  Musculoskeletal:        General: Tenderness present. Normal range of motion.     Cervical back: Normal range of motion and neck supple.     Comments: Pain in posterior neck with flexion, extension, and rotation  Skin:    General: Skin is warm and dry.     Findings: No erythema or rash.  Neurological:     Mental Status: He is alert and oriented to person, place, and time.     Cranial Nerves: No cranial nerve deficit.     Deep Tendon Reflexes: Reflexes are normal and symmetric.  Psychiatric:        Behavior: Behavior normal.        Thought Content: Thought content normal.        Judgment: Judgment normal.     BP 97/68   Pulse (!) 54   Temp 97.8 F (36.6 C)   Ht 6' (1.829 m)   Wt 153 lb (69.4 kg)   SpO2 97%   BMI 20.75 kg/m       Assessment & Plan:  HAGOP MCCOLLAM comes in today with chief complaint  of surgical clearance   Diagnosis and orders addressed:  1. Encounter for preoperative examination for general surgical procedure - EKG 12-Lead  2. Cervical spondylosis with radiculopathy  3. Chronic neck pain  4. Dry skin Start Amlactin as needed - ammonium lactate (AMLACTIN) 12 % lotion; Apply 1 application topically as needed for dry skin.  Dispense: 400 g; Refill: 1   Labs pending- orders in from pre-op for labs and chest x-ray Health Maintenance reviewed Diet and exercise encouraged  Follow up plan: As needed    Evelina Dun, FNP

## 2021-02-26 ENCOUNTER — Encounter: Payer: Self-pay | Admitting: Family Medicine

## 2021-02-28 DIAGNOSIS — H5213 Myopia, bilateral: Secondary | ICD-10-CM | POA: Diagnosis not present

## 2021-03-01 DIAGNOSIS — J84112 Idiopathic pulmonary fibrosis: Secondary | ICD-10-CM | POA: Diagnosis not present

## 2021-03-01 DIAGNOSIS — M5136 Other intervertebral disc degeneration, lumbar region: Secondary | ICD-10-CM | POA: Diagnosis not present

## 2021-03-01 DIAGNOSIS — G894 Chronic pain syndrome: Secondary | ICD-10-CM | POA: Diagnosis not present

## 2021-03-01 DIAGNOSIS — R051 Acute cough: Secondary | ICD-10-CM | POA: Diagnosis not present

## 2021-03-01 DIAGNOSIS — Z79899 Other long term (current) drug therapy: Secondary | ICD-10-CM | POA: Diagnosis not present

## 2021-03-01 DIAGNOSIS — J439 Emphysema, unspecified: Secondary | ICD-10-CM | POA: Diagnosis not present

## 2021-03-01 DIAGNOSIS — Z9189 Other specified personal risk factors, not elsewhere classified: Secondary | ICD-10-CM | POA: Diagnosis not present

## 2021-03-10 ENCOUNTER — Encounter: Payer: Self-pay | Admitting: Primary Care

## 2021-03-10 ENCOUNTER — Other Ambulatory Visit: Payer: Self-pay

## 2021-03-10 ENCOUNTER — Ambulatory Visit: Payer: Medicaid Other | Admitting: Primary Care

## 2021-03-10 ENCOUNTER — Telehealth: Payer: Self-pay | Admitting: Primary Care

## 2021-03-10 VITALS — BP 100/60 | HR 88 | Temp 98.2°F | Ht 72.0 in | Wt 149.8 lb

## 2021-03-10 DIAGNOSIS — G1221 Amyotrophic lateral sclerosis: Secondary | ICD-10-CM

## 2021-03-10 DIAGNOSIS — R0982 Postnasal drip: Secondary | ICD-10-CM | POA: Insufficient documentation

## 2021-03-10 DIAGNOSIS — Z01811 Encounter for preprocedural respiratory examination: Secondary | ICD-10-CM | POA: Insufficient documentation

## 2021-03-10 DIAGNOSIS — J849 Interstitial pulmonary disease, unspecified: Secondary | ICD-10-CM | POA: Diagnosis not present

## 2021-03-10 DIAGNOSIS — J84112 Idiopathic pulmonary fibrosis: Secondary | ICD-10-CM | POA: Diagnosis not present

## 2021-03-10 LAB — HEPATIC FUNCTION PANEL
ALT: 11 U/L (ref 0–53)
AST: 16 U/L (ref 0–37)
Albumin: 4.2 g/dL (ref 3.5–5.2)
Alkaline Phosphatase: 70 U/L (ref 39–117)
Bilirubin, Direct: 0.1 mg/dL (ref 0.0–0.3)
Total Bilirubin: 0.6 mg/dL (ref 0.2–1.2)
Total Protein: 8.3 g/dL (ref 6.0–8.3)

## 2021-03-10 MED ORDER — AZELASTINE HCL 0.1 % NA SOLN: 1.0000 | Freq: Two times a day (BID) | NASAL | 2 refills | Status: DC

## 2021-03-10 NOTE — Telephone Encounter (Signed)
His O2 was fine, lowest his oxygen got on ambulatory walk test today was 93% RA.   Raquel Sarna can we get him scheduled for ABG prior to surgery end of August

## 2021-03-10 NOTE — Progress Notes (Signed)
@Patient  ID: Bradley Rice, male    DOB: [birth year], 60 y.o.   MRN: [id number]  Chief Complaint  Patient presents with   Follow-up    Patient needs surgical clearance for neck surgery on 03/25/21.     Referring provider: Sharion Balloon, FNP  HPI: 60 year old male former smoker followed in our office for interstitial lung disease  Past medical history: Multilevel spondylosis, TIA, postlaminectomy syndrome, anxiety, depression, hypertension, chronic pain, ALS Smoking history: Former smoker Maintenance: Community education officer, Symbicort 160, OFEV Patient of Dr. Chase Rice  Previous [facility] pulmonary encounter:  Chief complaint: 1 month follow-up, short of breath, dizzy and lightheaded  60 year old male followed in our office for IPF.  He is completing 1 month follow-up from June/08/2019 visit with Dr. Chase Rice.  Patient reports that he has an upcoming appointment with Bradley Rice transplant team on 02/14/2020.  Patient reports adherence to Spiriva HandiHaler and Symbicort 160.  Patient reports that he is adherent to taking Ofev.  He is tolerating this well.  Initially when starting Ofev he had aspects of diarrhea but he reports that he is now tolerating without any issues.  He is not having any current headaches.  Patient is struggling with obtaining oxygen for management of exertional hypoxemia.  Patient reports that his oxygen levels dropped to 80 and 85 when on room air with physical exertion.  At rest today on room air patient's oxygen levels were 92%.  With exertion he dropped to 84%.  This was a telephonic visit.  Patient does not have portable oxygen.  He occasionally uses his mother's portable oxygen in order to make it to office visits.  His DME company is [employer].  He reports that they are aware of this.  We will follow up on this emergently today.  Lab work was ordered to further investigate his hypoxemia.  Patient reports that he presented to [facility] as instructed to obtain this in  [place] so there were no records of it.  We will follow up on this today.  Patient will be in [place] on Thursday this week and could obtain the blood work in our office.   12/31/2019 -   Chief Complaint  Patient presents with   Follow-up    PFT.  sob all the time.  HA, had CT scan done.  off Ofev for 1 week.   Follow-up IPF [diagnosis given January 2020, clinical diagnosis, based on male gender, clubbing, family history, smoking history, negative serology and associated emphysema] with associated emphysema in the setting of associated ALS.  Delays with Esbriet start in early 2020. Restart esbriet 06/18/2019 or 06/19/2019. Stopped Janm 2021 due to GI issues.  Restart nintedanib February 2021 but stopped in May 2021 because of headache that was unrelated.  Restarting nintedanib again in June 2021.  Known associated emphysema with Spiriva and Symbicort  Coronary artery calcification seen on CT scan March 2020. -Low risk nuclear medicine stress test Jan 2021  Lung nodules  =- Lungs/Pleura: No pleural fluid. Redemonstration of basilar and peripheral predominant subpleural reticulation with traction bronchiectasis, architectural distortion, and areas of mild honeycombing. Concurrent centrilobular and paraseptal emphysema. Multiple bilateral pulmonary nodules. Those nodules measure greater than 6 mm are similar on the 10/16/2018 diagnostic CT. For example a left upper lobe nodule of volume derived equivalent diameter 9.0 mm is not significantly changed.  HPI Bradley Rice 60 y.o. -presents for follow-up.  Last seen in February 2021 by myself.  At that time instituted nintedanib but then  he called back had a televisit with nurse practitioner.  He was reporting headaches after starting nintedanib.  He stopped the nintedanib with the headaches did not resolve.  He went and saw the EDP for the headaches..  Nevertheless he was reassured.  However the headache still has not resolved.  He feels it is  because of his glasses.  He has never seen a neurologist because of the headaches.  In terms of his ILD his symptoms are worse.  He is off the nintedanib because the there was concern the headache was because of the nintedanib.  He says he never had any GI side effects with the nintedanib and is willing to restart the nintedanib.  He feels he is more short of breath.  Is not able to do things with the grandkids that he used to do even a few months ago.  He says when he checks his pulse ox at home with exertion it dropped to the 70s.  He feels a little bit fatigue does not want to walk today but he finally agreed for a simple walk test.  When we walked him 185 feet x 3 laps on room air he did not drop below 94%.  He still wants a portable oxygen.  He continues to use his overnight oxygen study.  He is interested in a transplant referral because of his young age, lean body mass index and also progressive symptoms.  He did have a nuclear medicine stress test earlier this year this was normal.  He did have a follow-up low-dose CT scan of the chest for lung nodules and his lung nodules are stable in the last 1 year.  I am not so sure that he is seen the genetics counselor attended pulmonary rehabilitation as yet.  03/10/2021- interim hx  Patient presents today for surgical risk assessment. Planning for upcoming anterior cervical decompression/disectomy fusion 2 levels C3-5 which has been scheduled for August 25th with Dr. Rolena Rice. Patient is followed by Dr. Chase Rice for IPF, he was last seen in our office on 01/28/20.   He feels well, he has no acute complaints today. Breathing has been stable. Maintained on OFEV 150mg  twice a day.He wears 2L oxygen at night. He has a dry cough in the morning and some post nasal drip symptoms. He has no significant shortness of breath at rest or with ADL. He experiences dyspnea in the heat or with long walks.  He had CXR in April 2022 that showed similar appearance of fibrotic lung  changes without definite evidence of superimposed acute cardiopulmonary disease    SYMPTOM SCALE - ILD 05/30/2019  06/25/2019  12/31/2019 Off ofev - 150# 03/10/2021   O2 use ra   RA   Shortness of Breath 0 -> 5 scale with 5 being worst (score 6 If unable to do)     At rest 4 5 5  0  Simple tasks - showers, clothes change, eating, shaving 4 3 5  0  Household (dishes, doing bed, laundry) 4 3 6  0  Shopping 5 5 6  0  Walking level at own pace 5 4 6 1   Walking up Stairs 5 5  3   Total (40 - 48) Dyspnea Score 37 34 28 4  How bad is your cough? 5 4 5  4/ morning only  How bad is your fatigue 4 4 0 0  nauesa   0 0  vomit   0 0  dirahee   0 0  anxiety   0 0  depression  0 0    Simple office walk 185 feet x  3 laps goal with forehead probe 08/23/2018  05/30/2019  06/25/2019  12/31/2019   03/10/2021   O2 used Room air Room air RA  RA  Number laps completed 3 3 6wmd  3  Comments about pace Slow pace* steady 7 laps  Regular pace  Resting Pulse Ox/HR 99% and 93/min 98% and HR 96/min   98% RA; HR 83  Final Pulse Ox/HR 98% and 110/min 98% and HR 105/mn  Did not drop below 94% on simple walk test 93% RA; HR 100  Desaturated </= 88% no no   No   Desaturated <= 3% points no no   Yes  Got Tachycardic >/= 90/min yes Yes even at rest   Yes  Symptoms at end of test Moderate dyspnea, lightheaded moderate Did ntt drop pulse ox . sTiooed due to dyspnea  No dyspnea  Miscellaneous comments  stable   Stable     Allergies  Allergen Reactions   Pirfenidone Nausea And Vomiting and Other (See Comments)    Weight loss    Immunization History  Administered Date(s) Administered   Influenza Split 06/26/2012   Influenza,inj,Quad PF,6+ Mos 08/24/2015, 08/25/2016, 04/30/2018   Influenza-Unspecified 06/26/2012, 08/24/2015, 08/25/2016, 05/21/2019   Moderna Sars-Covid-2 Vaccination 10/31/2019, 12/03/2019, 09/10/2020   Pneumococcal Polysaccharide-23 06/25/2019, 02/22/2021   Tdap 06/01/2013    Past Medical  History:  Diagnosis Date   ALS (amyotrophic lateral sclerosis) (HCC)    Asthma    Back pain    COPD (chronic obstructive pulmonary disease) ([place])    Coronary artery calcification seen on CT scan    DDD (degenerative disc disease), lumbar    Depression    Dysphagia    Following previous surgery   History of blood transfusion 2014   Post-op bleed   History of lung cancer    History of stroke 2015   Idiopathic pulmonary fibrosis ([place])    Migraine    Spider bite 2012    Tobacco History: Social History   Tobacco Use  Smoking Status Former   Packs/day: 1.00   Years: 30.00   Pack years: 30.00   Types: Cigarettes   Quit date: 10/2015   Years since quitting: 5.3  Smokeless Tobacco Never   Counseling given: Not Answered   Outpatient Medications Prior to Visit  Medication Sig Dispense Refill   albuterol (PROVENTIL) (2.5 MG/3ML) 0.083% nebulizer solution Take 3 mLs (2.5 mg total) by nebulization every 6 (six) hours as needed for wheezing or shortness of breath. 150 mL 1   albuterol (VENTOLIN HFA) 108 (90 Base) MCG/ACT inhaler Inhale 2 puffs into the lungs every 6 (six) hours as needed for wheezing or shortness of breath. 1 each 5   ammonium lactate (AMLACTIN) 12 % lotion Apply 1 application topically as needed for dry skin. 400 g 1   aspirin EC 81 MG tablet Take 81 mg by mouth daily.     budesonide-formoterol (SYMBICORT) 160-4.5 MCG/ACT inhaler Inhale 2 puffs into the lungs 2 (two) times daily. 1 each 3   feeding supplement (ENSURE ENLIVE / ENSURE PLUS) LIQD Take 237 mLs by mouth 2 (two) times daily between meals. 237 mL 12   methocarbamol (ROBAXIN) 750 MG tablet Take 1 tablet (750 mg total) by mouth 2 (two) times daily as needed. 180 tablet 1   metoprolol succinate (TOPROL-XL) 50 MG 24 hr tablet Take 1 tablet (50 mg total) by mouth daily. 90 tablet 1   Misc. Devices (  CRUTCH-MATE ADULT FOREARM) MISC 1 application by Does not apply route daily. 2 each 0   Nintedanib (OFEV) 150 MG  CAPS Take 1 capsule (150 mg total) by mouth 2 (two) times daily. 180 capsule 3   ondansetron (ZOFRAN) 4 MG tablet Take 1 tablet (4 mg total) by mouth 3 (three) times daily. 90 tablet 3   oxyCODONE (ROXICODONE) 15 MG immediate release tablet Take 15 mg by mouth 4 (four) times daily.  0   sildenafil (VIAGRA) 100 MG tablet Take 1 tablet (100 mg total) by mouth daily as needed for erectile dysfunction. 60 tablet 2   tiotropium (SPIRIVA) 18 MCG inhalation capsule Place 1 capsule (18 mcg total) into inhaler and inhale daily. 30 capsule 6   oxyCODONE-acetaminophen (PERCOCET/ROXICET) 5-325 MG tablet Take 1 tablet by mouth 2 (two) times daily as needed.     ammonium lactate (AMLACTIN) 12 % cream SMARTSIG:1 Application Topical PRN     methocarbamol (ROBAXIN) 750 MG tablet Take by mouth.     metoprolol tartrate (LOPRESSOR) 50 MG tablet Take by mouth.     ondansetron (ZOFRAN) 4 MG tablet Take by mouth.     No facility-administered medications prior to visit.    Review of Systems  Review of Systems  Constitutional:  Negative for activity change.  HENT:  Positive for postnasal drip.   Respiratory:  Positive for cough. Negative for chest tightness, shortness of breath and wheezing.        DOE  Cardiovascular: Negative.   Musculoskeletal:  Positive for neck pain.  Neurological:  Positive for headaches.    Physical Exam  BP 100/60 (BP Location: Left Arm, Patient Position: Sitting, Cuff Size: Normal)   Pulse 88   Temp 98.2 F (36.8 C) (Oral)   Ht 6' (1.829 m)   Wt 149 lb 12.8 oz (67.9 kg)   SpO2 95%   BMI 20.32 kg/m  Physical Exam Constitutional:      Appearance: Normal appearance.  HENT:     Head: Normocephalic and atraumatic.     Mouth/Throat:     Mouth: Mucous membranes are moist.     Pharynx: Oropharynx is clear.  Cardiovascular:     Rate and Rhythm: Normal rate and regular rhythm.  Pulmonary:     Breath sounds: Rales present.  Musculoskeletal:        General: Normal range of  motion.  Skin:    General: Skin is warm and dry.  Neurological:     General: No focal deficit present.     Mental Status: He is alert and oriented to person, place, and time. Mental status is at baseline.     Lab Results:  CBC    Component Value Date/Time   WBC 13.0 (H) 11/03/2020 1537   WBC 10.5 01/30/2020 1014   RBC 5.07 11/03/2020 1537   RBC 4.74 01/30/2020 1014   HGB 15.9 11/03/2020 1537   HCT 49.1 11/03/2020 1537   PLT 357 11/03/2020 1537   MCV 97 11/03/2020 1537   MCH 31.4 11/03/2020 1537   MCH 31.3 12/15/2019 2328   MCHC 32.4 11/03/2020 1537   MCHC 33.9 01/30/2020 1014   RDW 12.5 11/03/2020 1537   LYMPHSABS 2.8 11/03/2020 1537   MONOABS 1.0 01/30/2020 1014   EOSABS 0.3 11/03/2020 1537   BASOSABS 0.2 11/03/2020 1537    BMET    Component Value Date/Time   NA 141 11/03/2020 1537   K 4.4 11/03/2020 1537   CL 99 11/03/2020 1537   CO2 25 11/03/2020  1537   GLUCOSE 81 11/03/2020 1537   GLUCOSE 113 (H) 01/30/2020 1014   BUN 6 11/03/2020 1537   CREATININE 0.77 11/03/2020 1537   CALCIUM 9.9 11/03/2020 1537   GFRNONAA >60 12/15/2019 2328   GFRAA >60 12/15/2019 2328    BNP No results found for: BNP  ProBNP    Component Value Date/Time   PROBNP 30.0 01/30/2020 1014    Imaging: No results found.   Assessment & Plan:   IPF (idiopathic pulmonary fibrosis) ([place]) - Patient has not been seen in >1 year. He has no respiratory complaints except for morning cough. He remains compliant with OFEV 150mg  BID. Ambulatory walk test today showed that his oxygen level did not desaturate <90% on room air. He needs LFTs, Spirometry/DLCO and HRCT. These do not need to be completed before his surgical procedure but we do recommend he have ABGs prior d.t hx ILD and possible ALS. Continue 2L oxygen at night. FU in 2-3 months with Dr. Chase Rice.   Pre-operative respiratory examination - Patient has hx ILD and is maintained on anti-fibrotic medication and nocturnal oxygen. He  appears to be at his baseline today. Lungs had coarse rales t/o. O2 was 95% RA at rest. He did not desaturation on ambulatory walk test today. He has had no recent respiratory infections, bronchitis or hospitalization. CXR in April 2022 showed stable appearance of known fibrotic lung disease without evidence of superimposed acute cardiopulmonary process - He will be considered intermediate risk for prolonged mechanical ventilation and post-op pulmonary complications. He is optimized for surgery from pulmonary standpoint. We will be getting ABGs prior to his surgical procedure. Ultimate clearance will be decided by his surgeon.    Post-nasal drip - Patient has dry morning cough, PND likely contributing. Recommend trial Astelin nasal spray twice daily    1) RISK FOR PROLONGED MECHANICAL VENTILAION - > 48h  1A) Arozullah - Prolonged mech ventilation risk Arozullah Postperative Pulmonary Risk Score - for mech ventilation dependence >48h [employer], Ann Surg 2000, major non-cardiac surgery) Comment Score  Type of surgery - abd ao aneurysm (27), thoracic (21), neurosurgery / upper abdominal / vascular (21), neck (11) Neck surgery 11  Emergency Surgery - (11)  0  ALbumin < 3 or poor nutritional state - (9)  0  BUN > 30 -  (8)  0  Partial or completely dependent functional status - (7)  0  COPD -  (6)  6 Hx IPF, (FEV1 <75%)  Age - 60 to 62 (4), > 70  (6)  0  TOTAL  17  Risk Stratifcation scores  - < 10 (0.5%), 11-19 (1.8%), 20-27 (4.2%), 28-40 (10.1%), >40 (26.6%)  1.8% risk prolonged mech ventilaton       1B) GUPTA - Prolonged Mech Vent Risk Score source Risk  Guptal post op prolonged mech ventilation > 48h or reintubation < 30 days - ACS 2007-2008 dataset - http://lewis-perez.info/ 0.4 % Risk of mechanical ventilation for >48 hrs after surgery, or unplanned intubation ?30 days of surgery    2) RISK FOR POST OP PNEUMONIA Score source Risk   Lyndel Safe - Post Op Pnemounia risk  [employer] 0.4 % Risk of postoperative pneumonia    R3) ISK FOR ANY POST-OP PULMONARY COMPLICATION Score source Risk  CANET/ARISCAT Score - risk for ANY/ALl pulmonary complications - > risk of in-hospital post-op pulmonary complications (composite including respiratory failure, respiratory infection, pleural effusion, atelectasis, pneumothorax, bronchospasm, aspiration pneumonitis) [employer] - based on age, anemia, pulse ox, resp infection prior 30d,  incision site, duration of surgery, and emergency v elective surgery Intermediate risk 13.3% risk of in-hospital post-op pulmonary complications (composite including respiratory failure, respiratory infection, pleural effusion, atelectasis, pneumothorax, bronchospasm, aspiration pneumonitis)    Martyn Ehrich, NP 03/10/2021

## 2021-03-10 NOTE — Telephone Encounter (Signed)
Called and spoke with pt letting him know the info stated by both Beth and MR and he verbalized understanding. Order has been placed for ABG. Nothing further needed.

## 2021-03-10 NOTE — Assessment & Plan Note (Signed)
-   Patient has not been seen in >1 year. He has no respiratory complaints except for morning cough. He remains compliant with OFEV 150mg  BID. Ambulatory walk test today showed that his oxygen level did not desaturate <90% on room air. He needs LFTs, Spirometry/DLCO and HRCT. These do not need to be completed before his surgical procedure but we do recommend he have ABGs prior d.t hx ILD and possible ALS. Continue 2L oxygen at night. FU in 2-3 months with Dr. Chase Caller.

## 2021-03-10 NOTE — Assessment & Plan Note (Signed)
-   Patient has dry morning cough, PND likely contributing. Recommend trial Astelin nasal spray twice daily

## 2021-03-10 NOTE — Telephone Encounter (Signed)
If his o2 level is good you do not need the spiro/dlco . I think he has ALS so would be good to get ABG. I do thkn surgery needs to be done in hospital because of his ALS and ILD

## 2021-03-10 NOTE — Telephone Encounter (Signed)
Patient is planning for upcoming anterior cervical decompression/disectomy fusion 2 levels C3-5 on August 25th with Dr. Rolena Infante. He has not been seen since June 2021. VSS. O2 95% RA. He has no acute complaints. ILD score was 4 (previous 24). He is taking OFEV 150mg  twice a day. He is due for repeat Spirometry with DLCO and CT imaging. Does he needs these before surgery, I think it will it will be hard to get them scheduled before his surgical date.

## 2021-03-10 NOTE — Assessment & Plan Note (Addendum)
-   Patient has hx ILD and is maintained on anti-fibrotic medication and nocturnal oxygen. He appears to be at his baseline today. Lungs had coarse rales t/o. O2 was 95% RA at rest. He did not desaturation on ambulatory walk test today. He has had no recent respiratory infections, bronchitis or hospitalization. CXR in April 2022 showed stable appearance of known fibrotic lung disease without evidence of superimposed acute cardiopulmonary process - He will be considered intermediate risk for prolonged mechanical ventilation and post-op pulmonary complications.  He is optimized for surgery from pulmonary standpoint. Surgical procedure should be done in hospital setting with anesthesiologiy. We will be getting ABGs prior to his surgical procedure. Ultimate clearance will be decided by his surgeon.    Major Pulmonary risks identified in the multifactorial risk analysis are but not limited to a) pneumonia; b) recurrent intubation risk; c) prolonged or recurrent acute respiratory failure needing mechanical ventilation; d) prolonged hospitalization; e) DVT/Pulmonary embolism; f) Acute Pulmonary edema  Recommend 1. Short duration of surgery as much as possible and avoid paralytic if possible 2. Recovery in step down or ICU with Pulmonary consultation 3. DVT prophylaxis 4. Aggressive pulmonary toilet with o2, bronchodilatation, and incentive spirometry and early ambulation

## 2021-03-10 NOTE — Patient Instructions (Addendum)
You are considered intermediate risk for prolonged mech ventilation and post op pulmonary complications. You are optimized for surgery from pulmonary standpoint. Ultimate clearance will be decided by surgeon.   Recommendations: Continue OFEV 150mg  twice a day Continue Symbicort 160 two puffs morning and evening; Spiriva 2 puffs once daily  Start Astelin nasal spray 1 puff per nostril twice daily (for post nasal drip/cough) Continue 2L oxygen at night   Orders: Hepatic function testing TODAY Needs PFTs in 8-12 weeks HRCT in 8-12 weeks  Follow-up: 2-3 months with Dr. Chase Caller ONLY (30 mins slot)

## 2021-03-11 ENCOUNTER — Telehealth: Payer: Self-pay | Admitting: Primary Care

## 2021-03-11 NOTE — Telephone Encounter (Signed)
Called spoke with Seth Bake. Confirmed fax number and who to send risk assessment to.  Last phone note and office note printed and faxed to [facility]. Nothing further needed at this time.

## 2021-03-16 ENCOUNTER — Ambulatory Visit: Payer: Self-pay | Admitting: Orthopedic Surgery

## 2021-03-16 NOTE — H&P (Deleted)
  The note originally documented on this encounter has been moved the the encounter in which it belongs.  
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
## 2021-03-17 NOTE — Progress Notes (Signed)
Surgical Instructions    Your procedure is scheduled on 03/25/21.  Report to [facility] Main Entrance "A" at 5:30 A.M., then check in with the Admitting office.  Call this number if you have problems the morning of surgery:  [phone]   If you have any questions prior to your surgery date call [phone]: Open Monday-Friday 8am-4pm    Remember:  Do not eat after midnight the night before your surgery  You may drink clear liquids until 4:30 the morning of your surgery.   Clear liquids allowed are: Water, Non-Citrus Juices (without pulp), Carbonated Beverages, Clear Tea, Black Coffee Only, and Gatorade    Take these medicines the morning of surgery with A SIP OF WATER : Nintedanib (OFEV)  tiotropium (SPIRIVA) - bring day of surgery oxyCODONE (ROXICODONE)  tiotropium (SPIRIVA) - bring day of surgery  AS NEEDED:   albuterol (PROVENTIL) - inhaler/nebulized  methocarbamol (ROBAXIN)  naloxone (NARCAN)    As of today, STOP taking any Aspirin (unless otherwise instructed by your surgeon) Aleve, Naproxen, Ibuprofen, Motrin, Advil, Goody's, BC's, all herbal medications, fish oil, and all vitamins.          Do not wear jewelry Do not wear lotions, powders, colognes, or deodorant. Do not shave 48 hours prior to surgery.  Men may shave face and neck. Do not bring valuables to the hospital.             [facility] is not responsible for any belongings or valuables.  Do NOT Smoke (Tobacco/Vaping) or drink Alcohol 24 hours prior to your procedure If you use a CPAP at night, you may bring all equipment for your overnight stay.   Contacts, glasses, dentures or bridgework may not be worn into surgery, please bring cases for these belongings   For patients admitted to the hospital, discharge time will be determined by your treatment team.   Patients discharged the day of surgery will not be allowed to drive home, and someone needs to stay with them for 24 hours.  ONLY 1 SUPPORT PERSON  MAY BE PRESENT WHILE YOU ARE IN SURGERY. IF YOU ARE TO BE ADMITTED ONCE YOU ARE IN YOUR ROOM YOU WILL BE ALLOWED TWO (2) VISITORS.  Minor children may have two parents present. Special consideration for safety and communication needs will be reviewed on a case by case basis.  Special instructions:    Oral Hygiene is also important to reduce your risk of infection.  Remember - BRUSH YOUR TEETH THE MORNING OF SURGERY WITH YOUR REGULAR TOOTHPASTE   [facility]- Preparing For Surgery  Before surgery, you can play an important role. Because skin is not sterile, your skin needs to be as free of germs as possible. You can reduce the number of germs on your skin by washing with CHG (chlorahexidine gluconate) Soap before surgery.  CHG is an antiseptic cleaner which kills germs and bonds with the skin to continue killing germs even after washing.     Please do not use if you have an allergy to CHG or antibacterial soaps. If your skin becomes reddened/irritated stop using the CHG.  Do not shave (including legs and underarms) for at least 48 hours prior to first CHG shower. It is OK to shave your face.  Please follow these instructions carefully.     Shower the NIGHT BEFORE SURGERY and the MORNING OF SURGERY with CHG Soap.   If you chose to wash your hair, wash your hair first as usual with your normal shampoo.  After you shampoo, rinse your hair and body thoroughly to remove the shampoo.  Then [employer] and genitals (private parts) with your normal soap and rinse thoroughly to remove soap.  After that Use CHG Soap as you would any other liquid soap. You can apply CHG directly to the skin and wash gently with a scrungie or a clean washcloth.   Apply the CHG Soap to your body ONLY FROM THE NECK DOWN.  Do not use on open wounds or open sores. Avoid contact with your eyes, ears, mouth and genitals (private parts). Wash Face and genitals (private parts)  with your normal soap.   Wash thoroughly, paying  special attention to the area where your surgery will be performed.  Thoroughly rinse your body with warm water from the neck down.  DO NOT shower/wash with your normal soap after using and rinsing off the CHG Soap.  Pat yourself dry with a CLEAN TOWEL.  Wear CLEAN PAJAMAS to bed the night before surgery  Place CLEAN SHEETS on your bed the night before your surgery  DO NOT SLEEP WITH PETS.   Day of Surgery:  Take a shower with CHG soap. Wear Clean/Comfortable clothing the morning of surgery Do not apply any deodorants/lotions.   Remember to brush your teeth WITH YOUR REGULAR TOOTHPASTE.   Please read over the following fact sheets that you were given.

## 2021-03-18 ENCOUNTER — Encounter (HOSPITAL_COMMUNITY): Payer: Self-pay

## 2021-03-18 ENCOUNTER — Other Ambulatory Visit: Payer: Self-pay

## 2021-03-18 ENCOUNTER — Encounter (HOSPITAL_COMMUNITY)
Admission: RE | Admit: 2021-03-18 | Discharge: 2021-03-18 | Disposition: A | Discharge status: Home / Self Care | Payer: Medicaid Other | Source: Ambulatory Visit | Attending: Orthopedic Surgery | Admitting: Orthopedic Surgery

## 2021-03-18 DIAGNOSIS — Z01812 Encounter for preprocedural laboratory examination: Secondary | ICD-10-CM | POA: Insufficient documentation

## 2021-03-18 LAB — CBC
HCT: 44.6 % (ref 39.0–52.0)
Hemoglobin: 14.5 g/dL (ref 13.0–17.0)
MCH: 31.3 pg (ref 26.0–34.0)
MCHC: 32.5 g/dL (ref 30.0–36.0)
MCV: 96.3 fL (ref 80.0–100.0)
Platelets: 309 10*3/uL (ref 150–400)
RBC: 4.63 MIL/uL (ref 4.22–5.81)
RDW: 13.2 % (ref 11.5–15.5)
WBC: 11.7 10*3/uL — ABNORMAL HIGH (ref 4.0–10.5)
nRBC: 0 % (ref 0.0–0.2)

## 2021-03-18 LAB — URINALYSIS, ROUTINE W REFLEX MICROSCOPIC
Bilirubin Urine: NEGATIVE
Glucose, UA: NEGATIVE mg/dL
Hgb urine dipstick: NEGATIVE
Ketones, ur: NEGATIVE mg/dL
Leukocytes,Ua: NEGATIVE
Nitrite: NEGATIVE
Protein, ur: NEGATIVE mg/dL
Specific Gravity, Urine: 1.003 — ABNORMAL LOW (ref 1.005–1.030)
pH: 7 (ref 5.0–8.0)

## 2021-03-18 LAB — BLOOD GAS, ARTERIAL
Acid-Base Excess: 0.6 mmol/L (ref 0.0–2.0)
Bicarbonate: 24.9 mmol/L (ref 20.0–28.0)
FIO2: 21
O2 Saturation: 96.7 %
Patient temperature: 37
pCO2 arterial: 42 mmHg (ref 32.0–48.0)
pH, Arterial: 7.391 (ref 7.350–7.450)
pO2, Arterial: 79.7 mmHg — ABNORMAL LOW (ref 83.0–108.0)

## 2021-03-18 LAB — BASIC METABOLIC PANEL
Anion gap: 6 (ref 5–15)
BUN: 6 mg/dL (ref 6–20)
CO2: 26 mmol/L (ref 22–32)
Calcium: 8.8 mg/dL — ABNORMAL LOW (ref 8.9–10.3)
Chloride: 107 mmol/L (ref 98–111)
Creatinine, Ser: 0.68 mg/dL (ref 0.61–1.24)
GFR, Estimated: >60 mL/min (ref >60)
Glucose, Bld: 87 mg/dL (ref 70–99)
Potassium: 4.1 mmol/L (ref 3.5–5.1)
Sodium: 139 mmol/L (ref 135–145)

## 2021-03-18 LAB — PROTIME-INR
INR: 1.1 (ref 0.8–1.2)
Prothrombin Time: 14.2 seconds (ref 11.4–15.2)

## 2021-03-18 LAB — APTT: aPTT: 34 seconds (ref 24–36)

## 2021-03-18 LAB — SURGICAL PCR SCREEN
MRSA, PCR: NEGATIVE
Staphylococcus aureus: NEGATIVE

## 2021-03-18 NOTE — Progress Notes (Addendum)
PCP: Evelina Dun, FNP Cardiologist: Rozann Lesches, MD Pulmonologist: Brand Males, MD  EKG: 02/22/21 CXR: 11/03/20 ECHO: 08/29/19 Stress Test: denies Cardiac Cath:denies  Fasting Blood Sugar- na Checks Blood Sugar_na__ times a day  OSA/CPAP: No Wears oxygen 2L at HS for severe pulmonary disease  ASA/Blood Thinner: Last dose ASA 1 week ago  Covid test 03/22/21.  Gave instructions, map and requisition to patient  Anesthesia Review: Yes, cardiac history and severe pulmonary issues  Patient denies shortness of breath, fever, cough, and chest pain at PAT appointment.  Patient verbalized understanding of instructions provided today at the PAT appointment.  Patient asked to review instructions at home and day of surgery.

## 2021-03-19 NOTE — Progress Notes (Signed)
Anesthesia Chart Review:  Follows with pulmonology for idiopathic pulmonary fibrosis.  He is treated with Ofev 150 mg twice daily, Symbicort, Spiriva, as needed albuterol. He is maintained on 2 L supplemental oxygen at night.  Seen by Geraldo Pitter, NP 03/10/2021 for preop evaluation.  Per note, he completed an ambulatory walk test that showed his oxygen level did not desaturate <90% on room air.   It was recommended that he have ABGs prior to surgery.  Per note, " - Patient has hx ILD and is maintained on anti-fibrotic medication and nocturnal oxygen. He appears to be at his baseline today. Lungs had coarse rales t/o. O2 was 95% RA at rest. He did not desaturation on ambulatory walk test today. He has had no recent respiratory infections, bronchitis or hospitalization. CXR in April 2022 showed stable appearance of known fibrotic lung disease without evidence of superimposed acute cardiopulmonary process - He will be considered intermediate risk for prolonged mechanical ventilation and post-op pulmonary complications.  He is optimized for surgery from pulmonary standpoint. Surgical procedure should be done in hospital setting with anesthesiologiy. We will be getting ABGs prior to his surgical procedure. Ultimate clearance will be decided by his surgeon.  Major Pulmonary risks identified in the multifactorial risk analysis are but not limited to a) pneumonia; b) recurrent intubation risk; c) prolonged or recurrent acute respiratory failure needing mechanical ventilation; d) prolonged hospitalization; e) DVT/Pulmonary embolism; f) Acute Pulmonary edema   Recommend 1. Short duration of surgery as much as possible and avoid paralytic if possible 2. Recovery in step down or ICU with Pulmonary consultation 3. DVT prophylaxis 4. Aggressive pulmonary toilet with o2, bronchodilatation, and incentive spirometry and early ambulation"  Patient had ABGs performed at preop testing which were WNL other than mildly low  PO2 of 79.7.  Dr. Chase Caller reviewed these results and stated that based on these, his risk for surgery is unchanged from prior evaluation by Geraldo Pitter, NP.  Patient was referred for cardiac eval by Dr. Chase Caller in January 2021.  He was seen by Dr. Domenic Polite and echo and stress test were ordered.  Stress was low risk, nonischemic and echo showed normal LVEF, normal wall motion, normal valves.  ALS is listed in patient history, however he had evaluation by neurologist Dr. Leta Baptist on 05/02/2028 that did not show evidence of ALS.  Per note, "- no clear evidence of ALS at this time (no fasciculations, no weakness, no hyperreflexia) - has significant pain and spasms due to degenerative spine issues (cervical and lumbar)  - consider referral to academic center ([facility]) for further evaluation (patient does not want to pursue additional testing; patient does not want EMG/NCS testing)"  Preop labs reviewed, unremarkable.  EKG 02/22/21: Sinus rhythm. Rate 80.  Spirometry 12/31/2019: FVC-%Pred-Pre Latest Units: % 61  FEV1-%Pred-Pre Latest Units: % 70  FEV1FVC-%Pred-Pre Latest Units: % 114  DLCO unc % pred Latest Units: % 62    TTE 08/29/2019:  1. Left ventricular ejection fraction, by visual estimation, is 60 to  65%. The left ventricle has normal function. There is no left ventricular  hypertrophy.   2. The left ventricle has no regional wall motion abnormalities.   3. Global right ventricle has normal systolic function.The right  ventricular size is normal. No increase in right ventricular wall  thickness.   4. Left atrial size was normal.   5. Right atrial size was normal.   6. The mitral valve is normal in structure. No evidence of mitral valve  regurgitation. No evidence of mitral stenosis.   7. The tricuspid valve is normal in structure.   8. The tricuspid valve is normal in structure. Tricuspid valve  regurgitation is not demonstrated.   9. The aortic valve is tricuspid. Aortic  valve regurgitation is not  visualized. No evidence of aortic valve sclerosis or stenosis.  10. The pulmonic valve was not well visualized. Pulmonic valve  regurgitation is not visualized.  11. Normal pulmonary artery systolic pressure.  12. The inferior vena cava is normal in size with greater than 50%  respiratory variability, suggesting right atrial pressure of 3 mmHg.   In comparison to the previous echocardiogram(s): Nuclear stress test done  08/28/19 showed an EF of 60%.   Nuclear stress 08/28/2019: No diagnostic ST segment changes to indicate ischemia. No significant myocardial perfusion defects to indicate scar or ischemia. This is a low risk study. Nuclear stress EF: 60%.    Wynonia Musty [facility]/Anesthesiology Phone [phone] 03/19/2021 3:12 PM

## 2021-03-19 NOTE — Anesthesia Preprocedure Evaluation (Addendum)
Anesthesia Evaluation  Patient identified by MRN, date of birth, ID band Patient awake    Reviewed: Allergy & Precautions, NPO status , Patient's Chart, lab work & pertinent test results  History of Anesthesia Complications Negative for: history of anesthetic complications  Airway Mallampati: I  TM Distance: >3 FB Neck ROM: Limited    Dental  (+) Dental Advisory Given   Pulmonary COPD,  COPD inhaler and oxygen dependent, former smoker,  Idiopathic pulmonary fibrosis, nocturnal O2 Moderate risk for pulmonary complications per pulm   Pulmonary exam normal        Cardiovascular negative cardio ROS Normal cardiovascular exam  Patient was referred for cardiac eval by Dr. Chase Caller in January 2021.  He was seen by Dr. Domenic Polite and echo and stress test were ordered.  Stress was low risk, nonischemic and echo showed normal LVEF, normal wall motion, normal valves.   Neuro/Psych Anxiety Depression    GI/Hepatic negative GI ROS, Neg liver ROS,   Endo/Other  negative endocrine ROS  Renal/GU negative Renal ROS  negative genitourinary   Musculoskeletal  (+) narcotic dependent  Abdominal   Peds  Hematology negative hematology ROS (+)   Anesthesia Other Findings    Reproductive/Obstetrics                          Anesthesia Physical Anesthesia Plan  ASA: 3  Anesthesia Plan: General   Post-op Pain Management:    Induction: Intravenous  PONV Risk Score and Plan: 2 and Ondansetron, Dexamethasone, Treatment may vary due to age or medical condition and Midazolam  Airway Management Planned: Oral ETT and Video Laryngoscope Planned  Additional Equipment: None  Intra-op Plan:   Post-operative Plan: Extubation in OR  Informed Consent: I have reviewed the patients History and Physical, chart, labs and discussed the procedure including the risks, benefits and alternatives for the proposed anesthesia with  the patient or authorized representative who has indicated his/her understanding and acceptance.     Dental advisory given  Plan Discussed with:   Anesthesia Plan Comments: (PAT note by Karoline Caldwell, PA-C:  Follows with pulmonology for idiopathic pulmonary fibrosis.  He is treated with Ofev 150 mg twice daily, Symbicort, Spiriva, as needed albuterol. He is maintained on 2 L supplemental oxygen at night.  Seen by Geraldo Pitter, NP 03/10/2021 for preop evaluation.  Per note, he completed an ambulatory walk test that showed his oxygen level did not desaturate <90% on room air.   It was recommended that he have ABGs prior to surgery.  Per note, "- Patient has hx ILD and is maintained on anti-fibrotic medication and nocturnal oxygen. He appears to be at his baseline today. Lungs had coarse rales t/o. O2 was 95% RA at rest. He did not desaturation on ambulatory walk test today. He has had no recent respiratory infections, bronchitis or hospitalization. CXR in April 2022 showed stable appearance of known fibrotic lung disease without evidence of superimposed acute cardiopulmonary process - He will be considered intermediate risk for prolonged mechanical ventilation and post-op pulmonary complications.  He is optimized for surgery from pulmonary standpoint. Surgical procedure should be done in hospital setting with anesthesiologiy. We will be getting ABGs prior to his surgical procedure. Ultimate clearance will be decided by his surgeon.  Major Pulmonary risks identified in the multifactorial risk analysis are but not limited to a) pneumonia; b) recurrent intubation risk; c) prolonged or recurrent acute respiratory failure needing mechanical ventilation; d) prolonged hospitalization; e) DVT/Pulmonary  embolism; f) Acute Pulmonary edema  Recommend 1. Short duration of surgery as much as possible and avoid paralytic if possible 2. Recovery in step down or ICU with Pulmonary consultation 3. DVT prophylaxis 4.  Aggressive pulmonary toilet with o2, bronchodilatation, and incentive spirometry and early ambulation"  Patient had ABGs performed at preop testing which were WNL other than mildly low PO2 of 79.7.  Dr. Chase Caller reviewed these results and stated that based on these, his risk for surgery is unchanged from prior evaluation by Geraldo Pitter, NP.  Patient was referred for cardiac eval by Dr. Chase Caller in January 2021.  He was seen by Dr. Domenic Polite and echo and stress test were ordered.  Stress was low risk, nonischemic and echo showed normal LVEF, normal wall motion, normal valves.  ALS is listed in patient history, however he had evaluation by neurologist Dr. Leta Baptist on 05/02/2028 that did not show evidence of ALS.  Per note, "- no clear evidence of ALS at this time (no fasciculations, no weakness, no hyperreflexia) - has significant pain and spasms due to degenerative spine issues (cervical and lumbar)  - consider referral to academic center ([facility]) for further evaluation (patient does not want to pursue additional testing; patient does not want EMG/NCS testing)"  Preop labs reviewed, unremarkable.  EKG 02/22/21: Sinus rhythm. Rate 80.  Spirometry 12/31/2019: FVC-%Pred-Pre Latest Units: % 61 FEV1-%Pred-Pre Latest Units: % 70 FEV1FVC-%Pred-Pre Latest Units: % 114 DLCO unc % pred Latest Units: % 62   TTE 08/29/2019: 1. Left ventricular ejection fraction, by visual estimation, is 60 to  65%. The left ventricle has normal function. There is no left ventricular  hypertrophy.  2. The left ventricle has no regional wall motion abnormalities.  3. Global right ventricle has normal systolic function.The right  ventricular size is normal. No increase in right ventricular wall  thickness.  4. Left atrial size was normal.  5. Right atrial size was normal.  6. The mitral valve is normal in structure. No evidence of mitral valve  regurgitation. No evidence of mitral stenosis.  7. The  tricuspid valve is normal in structure.  8. The tricuspid valve is normal in structure. Tricuspid valve  regurgitation is not demonstrated.  9. The aortic valve is tricuspid. Aortic valve regurgitation is not  visualized. No evidence of aortic valve sclerosis or stenosis.  10. The pulmonic valve was not well visualized. Pulmonic valve  regurgitation is not visualized.  11. Normal pulmonary artery systolic pressure.  12. The inferior vena cava is normal in size with greater than 50%  respiratory variability, suggesting right atrial pressure of 3 mmHg.   In comparison to the previous echocardiogram(s): Nuclear stress test done  08/28/19 showed an EF of 60%.   Nuclear stress 08/28/2019: . No diagnostic ST segment changes to indicate ischemia. . No significant myocardial perfusion defects to indicate scar or ischemia. . This is a low risk study. . Nuclear stress EF: 60%. )       Anesthesia Quick Evaluation

## 2021-03-22 DIAGNOSIS — M542 Cervicalgia: Secondary | ICD-10-CM | POA: Diagnosis not present

## 2021-03-22 DIAGNOSIS — J3489 Other specified disorders of nose and nasal sinuses: Secondary | ICD-10-CM | POA: Diagnosis not present

## 2021-03-22 DIAGNOSIS — J342 Deviated nasal septum: Secondary | ICD-10-CM | POA: Diagnosis not present

## 2021-03-23 ENCOUNTER — Other Ambulatory Visit: Payer: Self-pay | Admitting: Orthopedic Surgery

## 2021-03-23 ENCOUNTER — Inpatient Hospital Stay (HOSPITAL_COMMUNITY): Admission: RE | Admit: 2021-03-23 | Payer: Medicaid Other | Source: Ambulatory Visit

## 2021-03-23 LAB — SARS CORONAVIRUS 2 (TAT 6-24 HRS): SARS Coronavirus 2: NEGATIVE

## 2021-03-25 ENCOUNTER — Observation Stay (HOSPITAL_COMMUNITY)
Admission: RE | Admit: 2021-03-25 | Discharge: 2021-03-26 | Disposition: A | Discharge status: Home / Self Care | Payer: Medicaid Other | Attending: Orthopedic Surgery | Admitting: Orthopedic Surgery

## 2021-03-25 ENCOUNTER — Ambulatory Visit (HOSPITAL_COMMUNITY): Payer: Medicaid Other | Admitting: Certified Registered"

## 2021-03-25 ENCOUNTER — Ambulatory Visit (HOSPITAL_COMMUNITY): Payer: Medicaid Other | Admitting: Physician Assistant

## 2021-03-25 ENCOUNTER — Other Ambulatory Visit: Payer: Self-pay

## 2021-03-25 ENCOUNTER — Ambulatory Visit (HOSPITAL_COMMUNITY): Payer: Medicaid Other

## 2021-03-25 ENCOUNTER — Encounter (HOSPITAL_COMMUNITY)
Admission: RE | Disposition: A | Discharge status: Home / Self Care | Payer: Self-pay | Source: Home / Self Care | Attending: Orthopedic Surgery

## 2021-03-25 DIAGNOSIS — M4322 Fusion of spine, cervical region: Secondary | ICD-10-CM | POA: Diagnosis not present

## 2021-03-25 DIAGNOSIS — Z87891 Personal history of nicotine dependence: Secondary | ICD-10-CM | POA: Diagnosis not present

## 2021-03-25 DIAGNOSIS — I10 Essential (primary) hypertension: Secondary | ICD-10-CM | POA: Insufficient documentation

## 2021-03-25 DIAGNOSIS — J45909 Unspecified asthma, uncomplicated: Secondary | ICD-10-CM | POA: Diagnosis not present

## 2021-03-25 DIAGNOSIS — M5031 Other cervical disc degeneration,  high cervical region: Secondary | ICD-10-CM | POA: Diagnosis not present

## 2021-03-25 DIAGNOSIS — J449 Chronic obstructive pulmonary disease, unspecified: Secondary | ICD-10-CM | POA: Insufficient documentation

## 2021-03-25 DIAGNOSIS — Z79899 Other long term (current) drug therapy: Secondary | ICD-10-CM | POA: Diagnosis not present

## 2021-03-25 DIAGNOSIS — Z8673 Personal history of transient ischemic attack (TIA), and cerebral infarction without residual deficits: Secondary | ICD-10-CM | POA: Diagnosis not present

## 2021-03-25 DIAGNOSIS — Z7982 Long term (current) use of aspirin: Secondary | ICD-10-CM | POA: Diagnosis not present

## 2021-03-25 DIAGNOSIS — M50321 Other cervical disc degeneration at C4-C5 level: Principal | ICD-10-CM | POA: Insufficient documentation

## 2021-03-25 DIAGNOSIS — Z419 Encounter for procedure for purposes other than remedying health state, unspecified: Secondary | ICD-10-CM

## 2021-03-25 DIAGNOSIS — M4722 Other spondylosis with radiculopathy, cervical region: Secondary | ICD-10-CM | POA: Diagnosis not present

## 2021-03-25 DIAGNOSIS — Z85118 Personal history of other malignant neoplasm of bronchus and lung: Secondary | ICD-10-CM | POA: Insufficient documentation

## 2021-03-25 DIAGNOSIS — F411 Generalized anxiety disorder: Secondary | ICD-10-CM | POA: Diagnosis not present

## 2021-03-25 HISTORY — PX: ANTERIOR CERVICAL DECOMP/DISCECTOMY FUSION: SHX1161

## 2021-03-25 SURGERY — ANTERIOR CERVICAL DECOMPRESSION/DISCECTOMY FUSION 2 LEVELS
Anesthesia: General | Site: Spine Cervical

## 2021-03-25 MED ORDER — PHENYLEPHRINE HCL (PRESSORS) 10 MG/ML IV SOLN
INTRAVENOUS | Status: DC | PRN
  Administered 2021-03-25: 120 ug via INTRAVENOUS
  Administered 2021-03-25: 80 ug via INTRAVENOUS
  Administered 2021-03-25: 160 ug via INTRAVENOUS
  Administered 2021-03-25 (×3): 120 ug via INTRAVENOUS
  Administered 2021-03-25: 160 ug via INTRAVENOUS
  Administered 2021-03-25: 120 ug via INTRAVENOUS
  Administered 2021-03-25: 80 ug via INTRAVENOUS
  Administered 2021-03-25: 120 ug via INTRAVENOUS

## 2021-03-25 MED ORDER — SUGAMMADEX SODIUM 500 MG/5ML IV SOLN
INTRAVENOUS | Status: AC
  Filled 2021-03-25: qty 5

## 2021-03-25 MED ORDER — POLYETHYLENE GLYCOL 3350 17 G PO PACK: 17.0000 g | PACK | Freq: Every day | ORAL | Status: DC | PRN

## 2021-03-25 MED ORDER — PHENYLEPHRINE 40 MCG/ML (10ML) SYRINGE FOR IV PUSH (FOR BLOOD PRESSURE SUPPORT)
PREFILLED_SYRINGE | INTRAVENOUS | Status: AC
  Filled 2021-03-25: qty 10

## 2021-03-25 MED ORDER — THROMBIN 20000 UNITS EX SOLR
CUTANEOUS | Status: DC | PRN
  Administered 2021-03-25: 20 mL

## 2021-03-25 MED ORDER — DEXAMETHASONE SODIUM PHOSPHATE 10 MG/ML IJ SOLN
INTRAMUSCULAR | Status: AC
  Filled 2021-03-25: qty 1

## 2021-03-25 MED ORDER — CEFAZOLIN SODIUM-DEXTROSE 1-4 GM/50ML-% IV SOLN
1.0000 g | Freq: Three times a day (TID) | INTRAVENOUS | Status: AC
Start: 2021-03-25 — End: 2021-03-26
  Administered 2021-03-25 (×2): 1 g via INTRAVENOUS
  Filled 2021-03-25 (×2): qty 50

## 2021-03-25 MED ORDER — ALBUTEROL SULFATE HFA 108 (90 BASE) MCG/ACT IN AERS: 2.0000 | INHALATION_SPRAY | Freq: Four times a day (QID) | RESPIRATORY_TRACT | Status: DC | PRN

## 2021-03-25 MED ORDER — KETAMINE HCL 50 MG/5ML IJ SOSY
PREFILLED_SYRINGE | INTRAMUSCULAR | Status: AC
  Filled 2021-03-25: qty 5

## 2021-03-25 MED ORDER — FLEET ENEMA 7-19 GM/118ML RE ENEM: 1.0000 | ENEMA | Freq: Once | RECTAL | Status: DC | PRN

## 2021-03-25 MED ORDER — OXYCODONE HCL 5 MG PO TABS: 5.0000 mg | ORAL_TABLET | Freq: Once | ORAL | Status: DC | PRN

## 2021-03-25 MED ORDER — DOCUSATE SODIUM 100 MG PO CAPS
100.0000 mg | ORAL_CAPSULE | Freq: Two times a day (BID) | ORAL | Status: DC
  Administered 2021-03-25: 100 mg via ORAL
  Filled 2021-03-25: qty 1

## 2021-03-25 MED ORDER — SODIUM CHLORIDE 0.9% FLUSH: 3.0000 mL | Freq: Two times a day (BID) | INTRAVENOUS | Status: DC

## 2021-03-25 MED ORDER — BUPIVACAINE-EPINEPHRINE (PF) 0.25% -1:200000 IJ SOLN
INTRAMUSCULAR | Status: AC
  Filled 2021-03-25: qty 30

## 2021-03-25 MED ORDER — UMECLIDINIUM BROMIDE 62.5 MCG/INH IN AEPB
1.0000 | INHALATION_SPRAY | Freq: Every day | RESPIRATORY_TRACT | Status: DC
  Filled 2021-03-25: qty 7

## 2021-03-25 MED ORDER — ONDANSETRON HCL 4 MG/2ML IJ SOLN: 4.0000 mg | Freq: Once | INTRAMUSCULAR | Status: DC | PRN

## 2021-03-25 MED ORDER — ONDANSETRON HCL 4 MG PO TABS: 4.0000 mg | ORAL_TABLET | Freq: Three times a day (TID) | ORAL | 0 refills | Status: DC | PRN

## 2021-03-25 MED ORDER — 0.9 % SODIUM CHLORIDE (POUR BTL) OPTIME
TOPICAL | Status: DC | PRN
  Administered 2021-03-25 (×2): 1000 mL

## 2021-03-25 MED ORDER — TRANEXAMIC ACID 1000 MG/10ML IV SOLN
INTRAVENOUS | Status: DC | PRN
  Administered 2021-03-25: 1000 mg via INTRAVENOUS

## 2021-03-25 MED ORDER — SUCCINYLCHOLINE CHLORIDE 200 MG/10ML IV SOSY
PREFILLED_SYRINGE | INTRAVENOUS | Status: AC
  Filled 2021-03-25: qty 10

## 2021-03-25 MED ORDER — CHLORHEXIDINE GLUCONATE 0.12 % MT SOLN
OROMUCOSAL | Status: AC
  Administered 2021-03-25: 15 mL via OROMUCOSAL
  Filled 2021-03-25: qty 15

## 2021-03-25 MED ORDER — CHLORHEXIDINE GLUCONATE 0.12 % MT SOLN: 15.0000 mL | Freq: Once | OROMUCOSAL | Status: AC

## 2021-03-25 MED ORDER — SUGAMMADEX SODIUM 200 MG/2ML IV SOLN
INTRAVENOUS | Status: DC | PRN
  Administered 2021-03-25: 200 mg via INTRAVENOUS

## 2021-03-25 MED ORDER — FENTANYL CITRATE (PF) 100 MCG/2ML IJ SOLN
INTRAMUSCULAR | Status: DC | PRN
  Administered 2021-03-25: 50 ug via INTRAVENOUS
  Administered 2021-03-25: 100 ug via INTRAVENOUS
  Administered 2021-03-25 (×2): 50 ug via INTRAVENOUS

## 2021-03-25 MED ORDER — THROMBIN 20000 UNITS EX SOLR
CUTANEOUS | Status: AC
  Filled 2021-03-25: qty 20000

## 2021-03-25 MED ORDER — ACETAMINOPHEN 650 MG RE SUPP: 650.0000 mg | RECTAL | Status: DC | PRN

## 2021-03-25 MED ORDER — SUCCINYLCHOLINE CHLORIDE 200 MG/10ML IV SOSY
PREFILLED_SYRINGE | INTRAVENOUS | Status: DC | PRN
  Administered 2021-03-25: 140 mg via INTRAVENOUS

## 2021-03-25 MED ORDER — ONDANSETRON HCL 4 MG/2ML IJ SOLN
INTRAMUSCULAR | Status: AC
  Filled 2021-03-25: qty 2

## 2021-03-25 MED ORDER — ONDANSETRON HCL 4 MG PO TABS: 4.0000 mg | ORAL_TABLET | Freq: Four times a day (QID) | ORAL | Status: DC | PRN

## 2021-03-25 MED ORDER — PROPOFOL 10 MG/ML IV BOLUS
INTRAVENOUS | Status: AC
  Filled 2021-03-25: qty 20

## 2021-03-25 MED ORDER — SODIUM CHLORIDE 0.9% FLUSH: 3.0000 mL | INTRAVENOUS | Status: DC | PRN

## 2021-03-25 MED ORDER — ONDANSETRON HCL 4 MG/2ML IJ SOLN: 4.0000 mg | Freq: Four times a day (QID) | INTRAMUSCULAR | Status: DC | PRN

## 2021-03-25 MED ORDER — MIDAZOLAM HCL 2 MG/2ML IJ SOLN
INTRAMUSCULAR | Status: AC
  Filled 2021-03-25: qty 2

## 2021-03-25 MED ORDER — BUPIVACAINE-EPINEPHRINE 0.25% -1:200000 IJ SOLN
INTRAMUSCULAR | Status: DC | PRN
  Administered 2021-03-25: 8 mL

## 2021-03-25 MED ORDER — HYDROMORPHONE HCL 1 MG/ML IJ SOLN
0.2500 mg | INTRAMUSCULAR | Status: DC | PRN
  Administered 2021-03-25 (×2): 0.25 mg via INTRAVENOUS
  Administered 2021-03-25: 0.5 mg via INTRAVENOUS

## 2021-03-25 MED ORDER — ONDANSETRON HCL 4 MG/2ML IJ SOLN
INTRAMUSCULAR | Status: DC | PRN
  Administered 2021-03-25: 4 mg via INTRAVENOUS

## 2021-03-25 MED ORDER — DIAZEPAM 5 MG PO TABS
5.0000 mg | ORAL_TABLET | Freq: Four times a day (QID) | ORAL | Status: DC | PRN
  Administered 2021-03-25 – 2021-03-26 (×2): 5 mg via ORAL
  Filled 2021-03-25 (×3): qty 1

## 2021-03-25 MED ORDER — FENTANYL CITRATE (PF) 250 MCG/5ML IJ SOLN
INTRAMUSCULAR | Status: AC
  Filled 2021-03-25: qty 5

## 2021-03-25 MED ORDER — HYDROMORPHONE HCL 1 MG/ML IJ SOLN
INTRAMUSCULAR | Status: AC
  Filled 2021-03-25: qty 1

## 2021-03-25 MED ORDER — ROCURONIUM BROMIDE 10 MG/ML (PF) SYRINGE
PREFILLED_SYRINGE | INTRAVENOUS | Status: DC | PRN
  Administered 2021-03-25: 10 mg via INTRAVENOUS
  Administered 2021-03-25: 40 mg via INTRAVENOUS
  Administered 2021-03-25 (×2): 10 mg via INTRAVENOUS

## 2021-03-25 MED ORDER — PROPOFOL 10 MG/ML IV BOLUS
INTRAVENOUS | Status: DC | PRN
  Administered 2021-03-25: 140 mg via INTRAVENOUS

## 2021-03-25 MED ORDER — AMISULPRIDE (ANTIEMETIC) 5 MG/2ML IV SOLN: 10.0000 mg | Freq: Once | INTRAVENOUS | Status: DC | PRN

## 2021-03-25 MED ORDER — SODIUM CHLORIDE 0.9 % IV SOLN: 250.0000 mL | INTRAVENOUS | Status: DC

## 2021-03-25 MED ORDER — PHENOL 1.4 % MT LIQD
1.0000 | OROMUCOSAL | Status: DC | PRN
  Administered 2021-03-25: 1 via OROMUCOSAL
  Filled 2021-03-25: qty 177

## 2021-03-25 MED ORDER — METHOCARBAMOL 500 MG PO TABS: 500.0000 mg | ORAL_TABLET | Freq: Four times a day (QID) | ORAL | Status: DC | PRN

## 2021-03-25 MED ORDER — GLYCOPYRROLATE PF 0.2 MG/ML IJ SOSY
PREFILLED_SYRINGE | INTRAMUSCULAR | Status: DC | PRN
  Administered 2021-03-25: .2 mg via INTRAVENOUS

## 2021-03-25 MED ORDER — OXYCODONE HCL 5 MG/5ML PO SOLN: 5.0000 mg | Freq: Once | ORAL | Status: DC | PRN

## 2021-03-25 MED ORDER — HYDROMORPHONE HCL 1 MG/ML IJ SOLN
INTRAMUSCULAR | Status: AC
  Filled 2021-03-25: qty 0.5

## 2021-03-25 MED ORDER — MENTHOL 3 MG MT LOZG: 1.0000 | LOZENGE | OROMUCOSAL | Status: DC | PRN

## 2021-03-25 MED ORDER — ACETAMINOPHEN 10 MG/ML IV SOLN
INTRAVENOUS | Status: AC
  Filled 2021-03-25: qty 100

## 2021-03-25 MED ORDER — HYDROMORPHONE HCL 1 MG/ML IJ SOLN
1.0000 mg | INTRAMUSCULAR | Status: AC | PRN
Start: 2021-03-25 — End: 2021-03-26
  Administered 2021-03-25 – 2021-03-26 (×3): 1 mg via INTRAVENOUS
  Filled 2021-03-25 (×4): qty 1

## 2021-03-25 MED ORDER — GABAPENTIN 300 MG PO CAPS
300.0000 mg | ORAL_CAPSULE | Freq: Three times a day (TID) | ORAL | Status: DC
  Administered 2021-03-25: 300 mg via ORAL
  Filled 2021-03-25: qty 1

## 2021-03-25 MED ORDER — LACTATED RINGERS IV SOLN: INTRAVENOUS | Status: DC | PRN

## 2021-03-25 MED ORDER — ACETAMINOPHEN 10 MG/ML IV SOLN
1000.0000 mg | Freq: Once | INTRAVENOUS | Status: AC
  Administered 2021-03-25: 1000 mg via INTRAVENOUS

## 2021-03-25 MED ORDER — ORAL CARE MOUTH RINSE: 15.0000 mL | Freq: Once | OROMUCOSAL | Status: AC

## 2021-03-25 MED ORDER — GLYCOPYRROLATE PF 0.2 MG/ML IJ SOSY
PREFILLED_SYRINGE | INTRAMUSCULAR | Status: AC
  Filled 2021-03-25: qty 1

## 2021-03-25 MED ORDER — ALBUTEROL SULFATE (2.5 MG/3ML) 0.083% IN NEBU: 2.5000 mg | INHALATION_SOLUTION | Freq: Four times a day (QID) | RESPIRATORY_TRACT | Status: DC | PRN

## 2021-03-25 MED ORDER — ACETAMINOPHEN 325 MG PO TABS
650.0000 mg | ORAL_TABLET | ORAL | Status: DC | PRN
  Administered 2021-03-25 – 2021-03-26 (×2): 650 mg via ORAL
  Filled 2021-03-25 (×2): qty 2

## 2021-03-25 MED ORDER — LACTATED RINGERS IV SOLN: INTRAVENOUS | Status: DC

## 2021-03-25 MED ORDER — KETAMINE HCL 10 MG/ML IJ SOLN
INTRAMUSCULAR | Status: DC | PRN
  Administered 2021-03-25: 20 mg via INTRAVENOUS
  Administered 2021-03-25: 30 mg via INTRAVENOUS

## 2021-03-25 MED ORDER — DEXAMETHASONE SODIUM PHOSPHATE 10 MG/ML IJ SOLN
6.0000 mg | Freq: Four times a day (QID) | INTRAMUSCULAR | Status: AC
  Administered 2021-03-25 – 2021-03-26 (×4): 6 mg via INTRAVENOUS
  Filled 2021-03-25 (×4): qty 1

## 2021-03-25 MED ORDER — DEXAMETHASONE SODIUM PHOSPHATE 10 MG/ML IJ SOLN
INTRAMUSCULAR | Status: DC | PRN
  Administered 2021-03-25: 10 mg via INTRAVENOUS

## 2021-03-25 MED ORDER — MIDAZOLAM HCL 5 MG/5ML IJ SOLN
INTRAMUSCULAR | Status: DC | PRN
  Administered 2021-03-25: 2 mg via INTRAVENOUS

## 2021-03-25 MED ORDER — HYDROMORPHONE HCL 1 MG/ML IJ SOLN
INTRAMUSCULAR | Status: DC | PRN
  Administered 2021-03-25: .5 mg via INTRAVENOUS

## 2021-03-25 MED ORDER — METHOCARBAMOL 1000 MG/10ML IJ SOLN: 500.0000 mg | Freq: Four times a day (QID) | INTRAVENOUS | Status: DC | PRN

## 2021-03-25 MED ORDER — TIOTROPIUM BROMIDE MONOHYDRATE 18 MCG IN CAPS: 18.0000 ug | ORAL_CAPSULE | Freq: Every day | RESPIRATORY_TRACT | Status: DC

## 2021-03-25 MED ORDER — LIDOCAINE 2% (20 MG/ML) 5 ML SYRINGE
INTRAMUSCULAR | Status: DC | PRN
  Administered 2021-03-25: 80 mg via INTRAVENOUS

## 2021-03-25 MED ORDER — CEFAZOLIN SODIUM-DEXTROSE 2-4 GM/100ML-% IV SOLN
2.0000 g | INTRAVENOUS | Status: AC
  Administered 2021-03-25: 2 g via INTRAVENOUS
  Filled 2021-03-25: qty 100

## 2021-03-25 MED ORDER — ROCURONIUM BROMIDE 10 MG/ML (PF) SYRINGE
PREFILLED_SYRINGE | INTRAVENOUS | Status: AC
  Filled 2021-03-25: qty 10

## 2021-03-25 MED ORDER — OXYCODONE HCL 5 MG PO TABS
15.0000 mg | ORAL_TABLET | Freq: Four times a day (QID) | ORAL | Status: DC
Start: 2021-03-25 — End: 2021-03-26
  Administered 2021-03-25 – 2021-03-26 (×4): 15 mg via ORAL
  Filled 2021-03-25 (×4): qty 3

## 2021-03-25 MED ORDER — LIDOCAINE 2% (20 MG/ML) 5 ML SYRINGE
INTRAMUSCULAR | Status: AC
  Filled 2021-03-25: qty 5

## 2021-03-25 MED ORDER — NINTEDANIB ESYLATE 150 MG PO CAPS: 150.0000 mg | ORAL_CAPSULE | Freq: Two times a day (BID) | ORAL | Status: DC

## 2021-03-25 SURGICAL SUPPLY — 70 items
AGENT HMST KT MTR STRL THRMB (HEMOSTASIS) ×2
ANGLED SELF CENTERED DRILL 14MM ×1
BAG COUNTER SPONGE SURGICOUNT (BAG) ×2 IMPLANT
BAG SPNG CNTER NS LX DISP (BAG) ×1
BIT DRILL ANGLD SLF-CNTR SLV14 (DRILL) IMPLANT
BLADE 15 SAFETY STRL DISP (BLADE) ×1 IMPLANT
BLADE CLIPPER SURG (BLADE) IMPLANT
BONE TRINITY ELITE 1.2CC SM (Bone Implant) ×1 IMPLANT
BUR EGG ELITE 4.0 (BURR) IMPLANT
BUR MATCHSTICK NEURO 3.0 LAGG (BURR) IMPLANT
CABLE BIPOLOR RESECTION CORD (MISCELLANEOUS) ×2 IMPLANT
CANISTER SUCT 3000ML PPV (MISCELLANEOUS) ×2 IMPLANT
CLSR STERI-STRIP ANTIMIC 1/2X4 (GAUZE/BANDAGES/DRESSINGS) ×2 IMPLANT
COVER MAYO STAND STRL (DRAPES) ×6 IMPLANT
COVER SURGICAL LIGHT HANDLE (MISCELLANEOUS) ×4 IMPLANT
DRAPE C-ARM 42X72 X-RAY (DRAPES) ×2 IMPLANT
DRAPE POUCH INSTRU U-SHP 10X18 (DRAPES) ×2 IMPLANT
DRAPE SURG 17X23 STRL (DRAPES) ×2 IMPLANT
DRAPE U-SHAPE 47X51 STRL (DRAPES) ×2 IMPLANT
DRILL ANGLED SELF-CNTR SLV 14 (DRILL) ×2
DRSG OPSITE POSTOP 3X4 (GAUZE/BANDAGES/DRESSINGS) ×2 IMPLANT
DRSG OPSITE POSTOP 4X6 (GAUZE/BANDAGES/DRESSINGS) ×1 IMPLANT
DURAPREP 26ML APPLICATOR (WOUND CARE) ×2 IMPLANT
ELECT COATED BLADE 2.86 ST (ELECTRODE) ×2 IMPLANT
ELECT PENCIL ROCKER SW 15FT (MISCELLANEOUS) ×2 IMPLANT
ELECT REM PT RETURN 9FT ADLT (ELECTROSURGICAL) ×2
ELECTRODE REM PT RTRN 9FT ADLT (ELECTROSURGICAL) ×1 IMPLANT
GLOVE SURG ENC MOIS LTX SZ6.5 (GLOVE) ×2 IMPLANT
GLOVE SURG MICRO LTX SZ8.5 (GLOVE) ×2 IMPLANT
GLOVE SURG UNDER POLY LF SZ6.5 (GLOVE) ×2 IMPLANT
GLOVE SURG UNDER POLY LF SZ8.5 (GLOVE) ×2 IMPLANT
GOWN STRL REUS W/ TWL LRG LVL3 (GOWN DISPOSABLE) ×1 IMPLANT
GOWN STRL REUS W/TWL 2XL LVL3 (GOWN DISPOSABLE) ×2 IMPLANT
GOWN STRL REUS W/TWL LRG LVL3 (GOWN DISPOSABLE) ×2
KIT BASIN OR (CUSTOM PROCEDURE TRAY) ×2 IMPLANT
KIT TURNOVER KIT B (KITS) ×2 IMPLANT
NDL SPNL 18GX3.5 QUINCKE PK (NEEDLE) ×1 IMPLANT
NEEDLE HYPO 22GX1.5 SAFETY (NEEDLE) ×2 IMPLANT
NEEDLE SPNL 18GX3.5 QUINCKE PK (NEEDLE) ×2 IMPLANT
NS IRRIG 1000ML POUR BTL (IV SOLUTION) ×2 IMPLANT
PACK ORTHO CERVICAL (CUSTOM PROCEDURE TRAY) ×2 IMPLANT
PACK UNIVERSAL I (CUSTOM PROCEDURE TRAY) ×2 IMPLANT
PAD ARMBOARD 7.5X6 YLW CONV (MISCELLANEOUS) ×4 IMPLANT
PATTIES SURGICAL .25X.25 (GAUZE/BANDAGES/DRESSINGS) ×2 IMPLANT
PLATE AGX COAL 18X7 (Screw) ×2 IMPLANT
POSITIONER HEAD DONUT 9IN (MISCELLANEOUS) ×2 IMPLANT
PUTTY DBX 1CC (Putty) ×2 IMPLANT
PUTTY DBX 1CC DEPUY (Putty) IMPLANT
RESTRAINT LIMB HOLDER UNIV (RESTRAINTS) ×2 IMPLANT
SCREW SELF DRILL 3.6MM14MM (Screw) ×4 IMPLANT
SOL ANTI FOG 6CC (MISCELLANEOUS) IMPLANT
SOLUTION ANTI FOG 6CC (MISCELLANEOUS) ×1
SPACER HEDRON 18X7X10.5 7D (Screw) ×2 IMPLANT
SPONGE INTESTINAL PEANUT (DISPOSABLE) ×2 IMPLANT
SPONGE SURGIFOAM ABS GEL 100 (HEMOSTASIS) ×2 IMPLANT
SPONGE T-LAP 4X18 ~~LOC~~+RFID (SPONGE) ×4 IMPLANT
STRIP CLOSURE SKIN 1/2X4 (GAUZE/BANDAGES/DRESSINGS) ×1 IMPLANT
SURGIFLO W/THROMBIN 8M KIT (HEMOSTASIS) ×2 IMPLANT
SUT BONE WAX W31G (SUTURE) ×2 IMPLANT
SUT MNCRL AB 3-0 PS2 27 (SUTURE) ×2 IMPLANT
SUT SILK 2 0 (SUTURE)
SUT SILK 2-0 18XBRD TIE 12 (SUTURE) IMPLANT
SUT VIC AB 2-0 CT1 18 (SUTURE) ×2 IMPLANT
SYR BULB IRRIG 60ML STRL (SYRINGE) ×2 IMPLANT
SYR CONTROL 10ML LL (SYRINGE) ×2 IMPLANT
TAPE CLOTH 4X10 WHT NS (GAUZE/BANDAGES/DRESSINGS) ×2 IMPLANT
TAPE UMBILICAL COTTON 1/8X30 (MISCELLANEOUS) ×2 IMPLANT
TOWEL GREEN STERILE (TOWEL DISPOSABLE) ×2 IMPLANT
TOWEL GREEN STERILE FF (TOWEL DISPOSABLE) ×2 IMPLANT
WATER STERILE IRR 1000ML POUR (IV SOLUTION) ×2 IMPLANT

## 2021-03-25 NOTE — H&P (Signed)
Addendum H&P: Bradley Rice presents today for revision ACDF.  He now has adjacent segment disease with progressive neck pain.  He has had a previous C5-T1 ACDF approximately 7 years ago and now has adjacent segment disease C3-5.  As a result of the progression in his pain in the loss in quality of life we elected to move forward with a C3-5 ACDF.  I have explained the risks, benefits, and alternatives to surgery with the patient and is expressed an understanding as well as willingness to move forward with surgery.  There is been no change in his clinical exam since his last office visit of 03/16/2021.

## 2021-03-25 NOTE — Anesthesia Postprocedure Evaluation (Signed)
Anesthesia Post Note  Patient: Bradley Rice  Procedure(s) Performed: ANTERIOR CERVICAL DECOMPRESSION/DISCECTOMY FUSION 2 LEVELS  C3-5 (Spine Cervical)     Patient location during evaluation: PACU Anesthesia Type: General Level of consciousness: awake and alert Pain management: pain level controlled Vital Signs Assessment: post-procedure vital signs reviewed and stable Respiratory status: spontaneous breathing, nonlabored ventilation and respiratory function stable Cardiovascular status: blood pressure returned to baseline and stable Postop Assessment: no apparent nausea or vomiting Anesthetic complications: no   No notable events documented.  Last Vitals:  Vitals:   03/25/21 1302 03/25/21 1331  BP: 109/66 (!) 141/85  Pulse: 69 82  Resp: 11 18  Temp: 36.8 C 36.8 C  SpO2: 93% 95%    Last Pain:  Vitals:   03/25/21 1400  TempSrc:   PainSc: 10-Worst pain ever                 Lidia Collum

## 2021-03-25 NOTE — Transfer of Care (Signed)
Immediate Anesthesia Transfer of Care Note  Patient: Bradley Rice  Procedure(s) Performed: ANTERIOR CERVICAL DECOMPRESSION/DISCECTOMY FUSION 2 LEVELS  C3-5 (Spine Cervical)  Patient Location: PACU  Anesthesia Type:General  Level of Consciousness: awake, drowsy, patient cooperative and responds to stimulation  Airway & Oxygen Therapy: Patient Spontanous Breathing and Patient connected to face mask oxygen  Post-op Assessment: Post -op Vital signs reviewed and stable and Patient moving all extremities X 4  Post vital signs: Reviewed and stable  Last Vitals:  Vitals Value Taken Time  BP 125/74 03/25/21 1147  Temp    Pulse 85 03/25/21 1148  Resp 17 03/25/21 1148  SpO2 100 % 03/25/21 1148  Vitals shown include unvalidated device data.  Last Pain:  Vitals:   03/25/21 0639  TempSrc:   PainSc: 10-Worst pain ever         Complications: No notable events documented.

## 2021-03-25 NOTE — Op Note (Signed)
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  Status post prior C5-T1 fusion  POST-OPERATIVE DIAGNOSIS: Same  PROCEDURE:   Anterior cervical discectomy and fusion C3-5.  SURGEON:  Melina Schools, MD  PHYSICIAN ASSISTANT: Nelson Chimes, PA  ANESTHESIA:   General  EBL: 30 ml   Complications: None  Implants: Globus Hedron IC cage.  7 mm 7 degree lordotic large implant.  Separate anterior 0 profile plate affixed with 14 mm SD-VA screws  Graft: [employer], DBX putty  BRIEF HISTORY: Bradley Rice is a 60 y.o. male who had a prior C5-T1 ACDF several years ago.  Subsequently had additional anterior and posterior cervical surgery - ultimately with removal of the anterior cervical plate.  The patient has had continuous neck pain despite appropriate conservative management.  Significant adjacent segment disease C3-4 and C4-5.  As result of the failure to improve with conservative management we elected to extend his existing fusion approximately by performing an ACDF C3-5.  All appropriate risks, benefits, and alternatives to surgery were discussed with the patient and consent was obtained  PROCEDURE DETAILS: Patient was brought into the operating room and was properly positioned on the operating room table, and teds and SCDs were applied.  After induction with general anesthesia the patient was endotracheally intubated.  A timeout was taken to confirm all important data: Including patient, procedure, and the level.  Anterior cervical spine was prepped and draped in a standard fashion.  Using lateral fluoroscopy identified the C3-4 and the C4-5 disc space levels.  I then marked out a right-sided transverse incision and infiltrated with quarter percent Marcaine.  Transverse vision was made and sharp dissection was carried  out down to and through the platysma.  I continued dissecting in the deep cervical fascia along the medial border the sternocleidomastoid.  Is a standard Smith-Robinson approach to the cervical spine.  I ultimately was able to identify the esophagus and sweep this to the left and hold it there with a hand-held retractor.  I now had excellent visualization of the prevertebral fascia and anterior longitudinal ligament.  Using Kitner dissectors I removed the remaining prevertebral fascia and completely expose the anterior aspect of the spine.  A needle was placed into the C3-4 disc space and an x-ray was taken to confirm I was at the appropriate level.  The disc space was then marked with the Bovie and I proceeded to mobilize the longus coli muscles with bipolar cautery.  They were mobilized from the superior aspect of C3 down to the inferior aspect of C5.  Care was taken to always identify and protect the esophagus.  Once I had the anterior exposure I placed Caspar retracting blades underneath the longus coli muscle deflated the endotracheal cuff and expanded the retractor to the appropriate width.  At this point an annulotomy at C3-4 was performed with a 15 blade scalpel I remove the bulk of the disc material with pituitary rongeurs.  The large overhanging osteophyte was resected with a Kerrison rongeur.  Using curettes I remove the remainder of the disc material.  Distraction pins were placed into the body of C3 and C4 and I distracted the intervertebral space with a lamina spreader.  I maintained his distraction with the pin distraction set.  I continue to work posteriorly and ultimately was able to dissect through the posterior  annulus and posterior longitudinal ligament with my nerve hook.  Once I created a plane between the thecal sac and the posterior longitudinal ligament I was able to resect this with my 1 mm Kerrison rongeur.  This allowed me to undercut and remove the posterior osteophytes from the  vertebral bodies at 3 and 4 as well as under the uncovertebral joint.  At this point I had removed all the cartilaginous endplate and expose the bleeding subchondral bone.  I had an adequate decompression.  I then used the trialing devices and elected to use the size 7 m of the large 0 profile intervertebral spacer.  The implant was obtained and packed on the back table with the allograft.  It was then [facility] into position.  Once I confirmed satisfactory position the 2 locking screws were then advanced through the 0 profile plate.  Both screws had excellent purchase.  I then engaged the locking device per manufacture standards.  At this point time with the C3-4 level completed I remove the distraction pin from the body of C3 and sealed the bleeding bone with bone wax.  I then reremoved all of the remaining retractors to allow the esophagus a brief rest from retraction.  After approximately 5 minutes I repositioned my retractor but this time expose the C4-5 disc space level.  With the retractor in place I used the same technique to perform a discectomy at this level.  The annulotomy was performed and I remove the bulk of the disc material.  I then remove the overhanging osteophyte from the inferior aspect of the cecum for vertebral body and placed my distraction pin into the body of C5.  The disc space was expanded with a lamina spreader and I maintained this with the distraction pin set.  Curettes were used to remove the osteophyte and remaining disc.  I continued working posteriorly until I was at the posterior annulus.  I then used my fine nerve hook to dissect through the posterior annulus and I resected this with a 1 mm Kerrison rongeur.  This allowed me to undercut the uncovertebral joint and remove the posterior osteophytes from the vertebral bodies.  I then rasped the endplates ensuring that I had removed all the cartilaginous material and had bleeding subchondral bone.  I then trialed and like to use the  same implant that I used at the C3-4 level.  Again the implant was packed with bone graft and malleted to the appropriate depth.  The screws were then advanced through the cervical plate and into the vertebral body.  Again both screws had excellent purchase.  They were then secured to the plate according manufacture standards.  At this point I removed the remaining retractors and irrigated the wound copiously with normal saline.  I then returned the trachea esophagus to midline after confirming that hemostasis.  I then closed the platysma with interrupted 2-0 Vicryl suture, and the skin with 3-0 Monocryl.  Steri-Strips and a dry dressing were applied and the patient was ultimately extubated transfer the PACU without incident.  The end of the case all needle sponge counts were correct.  There were no adverse intraoperative events  Melina Schools, MD 03/25/2021 11:32 AM

## 2021-03-25 NOTE — Discharge Instructions (Signed)

## 2021-03-25 NOTE — Anesthesia Procedure Notes (Signed)
Procedure Name: Intubation Date/Time: 03/25/2021 7:54 AM Performed by: Cathren Harsh, CRNA Pre-anesthesia Checklist: Patient identified, Emergency Drugs available, Suction available and Patient being monitored Patient Re-evaluated:Patient Re-evaluated prior to induction Oxygen Delivery Method: Circle System Utilized Preoxygenation: Pre-oxygenation with 100% oxygen Induction Type: IV induction Laryngoscope Size: Glidescope and 4 Grade View: Grade I Tube type: Oral Tube size: 7.5 mm Number of attempts: 1 Airway Equipment and Method: Stylet and Oral airway Placement Confirmation: ETT inserted through vocal cords under direct vision, positive ETCO2 and breath sounds checked- equal and bilateral Secured at: 23 cm Tube secured with: Tape Dental Injury: Teeth and Oropharynx as per pre-operative assessment

## 2021-03-25 NOTE — Brief Op Note (Signed)
03/25/2021  11:43 AM  PATIENT:  Bradley Rice  60 y.o. 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  PROCEDURE:  Procedure(s) with comments: ANTERIOR CERVICAL DECOMPRESSION/DISCECTOMY FUSION 2 LEVELS  C3-5 (N/A) - ANTERIOR CERVICAL DECOMPRESSION/DISCECTOMY FUSION 2 LEVELS  C3-5  SURGEON:  Surgeon(s) and Role:    Melina Schools, MD - Primary  PHYSICIAN ASSISTANT:   ASSISTANTS: Nelson Chimes, PA  ANESTHESIA:   general  EBL:  30 mL   BLOOD ADMINISTERED:none  DRAINS: none   LOCAL MEDICATIONS USED:  MARCAINE     SPECIMEN:  No Specimen  DISPOSITION OF SPECIMEN:  N/A  COUNTS:  YES  TOURNIQUET:  * No tourniquets in log *  DICTATION: .Dragon Dictation  PLAN OF CARE: Admit for overnight observation  PATIENT DISPOSITION:  PACU - hemodynamically stable.

## 2021-03-26 ENCOUNTER — Encounter (HOSPITAL_COMMUNITY): Payer: Self-pay | Admitting: Orthopedic Surgery

## 2021-03-26 DIAGNOSIS — M50321 Other cervical disc degeneration at C4-C5 level: Secondary | ICD-10-CM | POA: Diagnosis not present

## 2021-03-26 NOTE — Progress Notes (Signed)
PT Cancellation Note  Patient Details Name: Bradley Rice MRN: [id number] DOB: 1961-03-16   Cancelled Treatment:    Reason Eval/Treat Not Completed: PT screened, no needs identified, will sign off. Pt and nursing staff report independent mobility and activity since surgery. Pt denies concerns for mobility at this time. Pt is able to verbalize brace use and neck precautions. Pt has no current acute PT needs. PT signing off.   [name] 03/26/2021, 8:40 AM

## 2021-03-26 NOTE — Progress Notes (Signed)
Subjective: 1 Day Post-Op Procedure(s) (LRB): ANTERIOR CERVICAL DECOMPRESSION/DISCECTOMY FUSION 2 LEVELS  C3-5 (N/A) Patient reports pain as moderate 7/10. Some difficulty swallowing yesterday, this has improved. Patient is eating, drinking, and taking pills. Tolerating PO without N/V +void +ambulation  Objective: Vital signs in last 24 hours: Temp:  [97.7 F (36.5 C)-99.2 F (37.3 C)] 97.9 F (36.6 C) (08/26 0519) Pulse Rate:  [60-102] 79 (08/26 0600) Resp:  [9-21] 18 (08/26 0519) BP: (99-141)/(55-85) 108/66 (08/26 0600) SpO2:  [93 %-100 %] 98 % (08/26 0519)  Intake/Output from previous day: 08/25 0701 - 08/26 0700 In: 1650 [I.V.:1500; IV Piggyback:150] Out: 1530 [Urine:1500; Blood:30] Intake/Output this shift: No intake/output data recorded.  No results for input(s): HGB in the last 72 hours. No results for input(s): WBC, RBC, HCT, PLT in the last 72 hours. No results for input(s): NA, K, CL, CO2, BUN, CREATININE, GLUCOSE, CALCIUM in the last 72 hours. No results for input(s): LABPT, INR in the last 72 hours.  Neurologically intact ABD soft Neurovascular intact Sensation intact distally Intact pulses distally Dorsiflexion/Plantar flexion intact Incision: dressing C/D/I Compartment soft   Assessment/Plan: 1 Day Post-Op Procedure(s) (LRB): ANTERIOR CERVICAL DECOMPRESSION/DISCECTOMY FUSION 2 LEVELS  C3-5 (N/A) Advance diet Up with therapy DVT Ppx: Teds, SCDs, ambulation Encourage IS  Plan D/C home after PT/OT  Will f/u with Dr. Rolena Infante in 2 weeks     Charlyne Petrin 03/26/2021, 7:18 AM

## 2021-03-26 NOTE — Progress Notes (Signed)
OT Cancellation Note  Patient Details Name: Bradley Rice MRN: [id number] DOB: 10/07/1960   Cancelled Treatment:    Reason Eval/Treat Not Completed: OT screened, no needs identified, will sign off. Pt is independent in mobility and aware of cervical precautions.   Malka So 03/26/2021, 9:06 AM Nestor Lewandowsky, OTR/L Acute Rehabilitation Services Pager: [phone] Office: [phone]

## 2021-03-26 NOTE — Plan of Care (Signed)
ADEQUATELY READY FOR DISCHARGE

## 2021-03-29 ENCOUNTER — Telehealth: Payer: Self-pay | Admitting: *Deleted

## 2021-03-29 NOTE — Telephone Encounter (Signed)
Transition Care Management Unsuccessful Follow-up Telephone Call  Date of discharge and from where:  03/26/2021 - [place]   Attempts:  1st Attempt  Reason for unsuccessful TCM follow-up call:  No answer/busy

## 2021-03-29 NOTE — Discharge Summary (Addendum)
Patient ID: Bradley Rice MRN: [id number] DOB/AGE: [birth year] 60 y.o.  Admit date: 03/25/2021 Discharge date: 03/26/2021  Admission Diagnoses:  Active Problems:   Fusion of spine, cervical region   Discharge Diagnoses:  Active Problems:   Fusion of spine, cervical region  status post Procedure(s): ANTERIOR CERVICAL DECOMPRESSION/DISCECTOMY FUSION 2 LEVELS  C3-5  Past Medical History:  Diagnosis Date   ALS (amyotrophic lateral sclerosis) (HCC)    Asthma    Back pain    COPD (chronic obstructive pulmonary disease) (HCC)    Coronary artery calcification seen on CT scan    DDD (degenerative disc disease), lumbar    Depression    Dysphagia    Following previous surgery   History of blood transfusion 2014   Post-op bleed   History of lung cancer    History of stroke 2015   Idiopathic pulmonary fibrosis ([place])    Migraine    Spider bite 2012    Surgeries: Procedure(s): ANTERIOR CERVICAL DECOMPRESSION/DISCECTOMY FUSION 2 LEVELS  C3-5 on 03/25/2021   Consultants:   Discharged Condition: Improved  Hospital Course: Bradley Rice is an 60 y.o. male who was admitted 03/25/2021 for operative treatment of degeneration of cervical intervertebral disc disease. Patient failed conservative treatments (please see the history and physical for the specifics) and had severe unremitting pain that affects sleep, daily activities and work/hobbies. After pre-op clearance, the patient was taken to the operating room on 03/25/2021 and underwent  Procedure(s): ANTERIOR CERVICAL DECOMPRESSION/DISCECTOMY FUSION 2 LEVELS  C3-5.    Patient was given perioperative antibiotics:  Anti-infectives (From admission, onward)    Start     Dose/Rate Route Frequency Ordered Stop   03/25/21 1600  ceFAZolin (ANCEF) IVPB 1 g/50 mL premix        1 g 100 mL/hr over 30 Minutes Intravenous Every 8 hours 03/25/21 1334 03/26/21 0004   03/25/21 0626  ceFAZolin (ANCEF) IVPB 2g/100 mL premix        2 g 200 mL/hr over 30  Minutes Intravenous 30 min pre-op 03/25/21 0626 03/25/21 0749        Patient was given sequential compression devices and early ambulation to prevent DVT.   Patient benefited maximally from hospital stay and there were no complications. At the time of discharge, the patient was urinating/moving their bowels without difficulty, tolerating a regular diet, pain is controlled with oral pain medications and they have been cleared by PT/OT.   Recent vital signs: No data found.   Recent laboratory studies: No results for input(s): WBC, HGB, HCT, PLT, NA, K, CL, CO2, BUN, CREATININE, GLUCOSE, INR, CALCIUM in the last 72 hours.  Invalid input(s): PT, 2   Discharge Medications:   Allergies as of 03/26/2021       Reactions   Pirfenidone Nausea And Vomiting, Other (See Comments)   Weight loss        Medication List     STOP taking these medications    ammonium lactate 12 % lotion Commonly known as: AmLactin   aspirin EC 81 MG tablet   azelastine 0.1 % nasal spray Commonly known as: ASTELIN   budesonide-formoterol 160-4.5 MCG/ACT inhaler Commonly known as: Symbicort   metoprolol succinate 50 MG 24 hr tablet Commonly known as: TOPROL-XL   sildenafil 100 MG tablet Commonly known as: Viagra       TAKE these medications    albuterol (2.5 MG/3ML) 0.083% nebulizer solution Commonly known as: PROVENTIL Take 3 mLs (2.5 mg total) by nebulization every 6 (six)  hours as needed for wheezing or shortness of breath.   albuterol 108 (90 Base) MCG/ACT inhaler Commonly known as: VENTOLIN HFA Inhale 2 puffs into the lungs every 6 (six) hours as needed for wheezing or shortness of breath.   Crutch-Mate Adult Forearm Misc 1 application by Does not apply route daily.   feeding supplement Liqd Take 237 mLs by mouth 2 (two) times daily between meals. What changed: when to take this   methocarbamol 750 MG tablet Commonly known as: ROBAXIN Take 1 tablet (750 mg total) by mouth 2 (two)  times daily as needed. What changed:  when to take this reasons to take this   naloxone 4 MG/0.1ML Liqd nasal spray kit Commonly known as: NARCAN Place 1 spray into the nose as needed (opioid overdose).   Ofev 150 MG Caps Generic drug: Nintedanib Take 1 capsule (150 mg total) by mouth 2 (two) times daily.   ondansetron 4 MG tablet Commonly known as: Zofran Take 1 tablet (4 mg total) by mouth every 8 (eight) hours as needed for nausea or vomiting.   oxyCODONE 15 MG immediate release tablet Commonly known as: ROXICODONE Take 15 mg by mouth 4 (four) times daily.   oxyCODONE-acetaminophen 5-325 MG tablet Commonly known as: PERCOCET/ROXICET Take 1 tablet by mouth 3 (three) times daily as needed (breakthrough pain).   tiotropium 18 MCG inhalation capsule Commonly known as: SPIRIVA Place 1 capsule (18 mcg total) into inhaler and inhale daily.        Diagnostic Studies: DG Cervical Spine 2 or 3 views  Result Date: 03/25/2021 CLINICAL DATA:  C3-5 ACDF. EXAM: CERVICAL SPINE - 2-3 VIEW COMPARISON:  01/29/2021. FINDINGS: Four intraoperative fluoroscopic spot views of the cervical spine are submitted. The first image, taken at 0940 hours, shows screws in the C3 and C4 vertebral bodies with an interbody spacer. Second image taken at 1059 hours shows C3-4 anterior cervical fusion. Screws are seen in the C4 and C5 vertebral bodies. Interbody spacers at C5-6 and C6-7, as on 01/29/2021. The third image, taken at 1113 hours, shows C3-4 and C4-5 anterior cervical fusions with interbody spacers. Final AP image, taken at 1114 hours again shows C3-4 and C4-5 anterior cervical fusions. IMPRESSION: Intraoperative visualization for C3-4 and C4-5 anterior cervical fusion. Electronically Signed   By: Lorin Picket M.D.   On: 03/25/2021 14:21   DG C-Arm 1-60 Min-No Report  Result Date: 03/25/2021 Fluoroscopy was utilized by the requesting physician.  No radiographic interpretation.    Discharge  Instructions     Incentive spirometry RT   Complete by: As directed         Follow-up Information     Melina Schools, MD. Schedule an appointment as soon as possible for a visit in 2 week(s).   Specialty: Orthopedic Surgery Why: If symptoms worsen, For wound re-check, For suture removal Contact information: [address] [place] [address] [phone]                 Discharge Plan:  discharge to home  Disposition: stable    Signed: Charlyne Petrin for [facility] PA-C [facility] [phone] 03/29/2021, 4:13 PM

## 2021-03-30 NOTE — Telephone Encounter (Signed)
Transition Care Management Unsuccessful Follow-up Telephone Call  Date of discharge and from where:  03/26/2021 - [place]  Attempts:  2nd Attempt  Reason for unsuccessful TCM follow-up call:  No answer/busy

## 2021-03-31 DIAGNOSIS — M5412 Radiculopathy, cervical region: Secondary | ICD-10-CM | POA: Diagnosis not present

## 2021-03-31 DIAGNOSIS — R634 Abnormal weight loss: Secondary | ICD-10-CM | POA: Diagnosis not present

## 2021-03-31 NOTE — Telephone Encounter (Signed)
Transition Care Management Unsuccessful Follow-up Telephone Call  Date of discharge and from where:  03/26/2021 - [place]  Attempts:  3rd Attempt  Reason for unsuccessful TCM follow-up call:  No answer/busy

## 2021-04-01 DIAGNOSIS — R051 Acute cough: Secondary | ICD-10-CM | POA: Diagnosis not present

## 2021-04-01 DIAGNOSIS — J439 Emphysema, unspecified: Secondary | ICD-10-CM | POA: Diagnosis not present

## 2021-04-01 DIAGNOSIS — J84112 Idiopathic pulmonary fibrosis: Secondary | ICD-10-CM | POA: Diagnosis not present

## 2021-04-03 DIAGNOSIS — R634 Abnormal weight loss: Secondary | ICD-10-CM | POA: Diagnosis not present

## 2021-04-08 ENCOUNTER — Encounter (HOSPITAL_COMMUNITY): Payer: Self-pay | Admitting: Orthopedic Surgery

## 2021-04-20 ENCOUNTER — Telehealth: Payer: Self-pay | Admitting: Internal Medicine

## 2021-04-20 DIAGNOSIS — Z79899 Other long term (current) drug therapy: Secondary | ICD-10-CM | POA: Diagnosis not present

## 2021-04-20 DIAGNOSIS — M542 Cervicalgia: Secondary | ICD-10-CM | POA: Diagnosis not present

## 2021-04-20 DIAGNOSIS — Z9189 Other specified personal risk factors, not elsewhere classified: Secondary | ICD-10-CM | POA: Diagnosis not present

## 2021-04-20 DIAGNOSIS — G894 Chronic pain syndrome: Secondary | ICD-10-CM | POA: Diagnosis not present

## 2021-04-20 NOTE — Telephone Encounter (Signed)
Called pt to give CT appt info: 10/3 at 7:30am; 7:15am arrival time at [facility].  Pt states he just had a CT of his spine and up before his surgery and wants to know if he needs another one right now.  Pt also states he is having trouble breathing and swallowing. States he cannot eat fast or he chokes and coughs it up.  Also wants to know if he should postpone PFT until he has recovered.

## 2021-04-20 NOTE — Telephone Encounter (Signed)
My concern is that his symptoms of cough, choking spells are related to his anterior cervical discectomy and fusion and his ALS.  I do not think it is related to pulmonary fibrosis.  I see it is based on description that eating or drinking make it worse and the timing after surgery  Plan - Agree to cancel pulmonary function test  - He should definitely do high-resolution CT chest supine and prone -> but see if this can be done this week  -Keep current appointment 05/10/2021  -If he feels he needs an interim visit between today and the 05/10/2021 date 0-> he can see nurse practitioner or do the televisit with me but it would be ideal to have the high-resolution CT chest even before any next visit

## 2021-04-20 NOTE — Telephone Encounter (Signed)
HRCT r/s for 9/23 at 12:30pm at [facility] for soonest appt. Informed pt. Nothing further needed.

## 2021-04-20 NOTE — Telephone Encounter (Signed)
I spoke with the pt and he was made aware of response per MR  He verbalized understanding  PFT cancelled but will keep ov with MR 05/10/21  He feels okay waiting until then  He is aware we will try and move up the HRCT and understands that he still needs to have this done   PCC's- MR is asking if the HRCT can be moved up to this wk sometime. Pt okay with this. Please call him, thanks!

## 2021-04-20 NOTE — Telephone Encounter (Signed)
Spoke with the pt  He had surgery on 03/25/21- Anterior cervical discectomy and fusion C3-5 He says since then having lots of cough and choking spells when eating or drinking  He is coughing until vomits sometimes  Not typically coughing up and sputum  He gets out of breath with these coughing spells and has noticed feels slightly more winded with exertion  He states that he is supposed to see Dr Rolena Infante next wk and they are working on ENT referral for him but no appt yet  He is scheduled for PFT & ov with MR 05/10/21- wants to cancel the PFT since he is not sure he will be able to test He is due for HRCT (scheduled for 10/3)and wonders if still needs this since just had cervical spine xray   Please advise- note that you have a 15 min slot Friday- maybe he could do a televisit or video with you? Thanks!

## 2021-04-23 ENCOUNTER — Ambulatory Visit (HOSPITAL_COMMUNITY): Payer: Medicaid Other

## 2021-04-29 ENCOUNTER — Ambulatory Visit (HOSPITAL_COMMUNITY): Payer: Medicaid Other

## 2021-05-01 DIAGNOSIS — R051 Acute cough: Secondary | ICD-10-CM | POA: Diagnosis not present

## 2021-05-01 DIAGNOSIS — J84112 Idiopathic pulmonary fibrosis: Secondary | ICD-10-CM | POA: Diagnosis not present

## 2021-05-01 DIAGNOSIS — J439 Emphysema, unspecified: Secondary | ICD-10-CM | POA: Diagnosis not present

## 2021-05-03 ENCOUNTER — Ambulatory Visit (HOSPITAL_COMMUNITY): Payer: Medicaid Other

## 2021-05-04 DIAGNOSIS — J849 Interstitial pulmonary disease, unspecified: Secondary | ICD-10-CM | POA: Diagnosis not present

## 2021-05-04 DIAGNOSIS — G1221 Amyotrophic lateral sclerosis: Secondary | ICD-10-CM | POA: Diagnosis not present

## 2021-05-04 DIAGNOSIS — Z79899 Other long term (current) drug therapy: Secondary | ICD-10-CM | POA: Diagnosis not present

## 2021-05-04 DIAGNOSIS — M5136 Other intervertebral disc degeneration, lumbar region: Secondary | ICD-10-CM | POA: Diagnosis not present

## 2021-05-04 DIAGNOSIS — Z4889 Encounter for other specified surgical aftercare: Secondary | ICD-10-CM | POA: Diagnosis not present

## 2021-05-04 DIAGNOSIS — G894 Chronic pain syndrome: Secondary | ICD-10-CM | POA: Diagnosis not present

## 2021-05-04 DIAGNOSIS — Z6820 Body mass index (BMI) 20.0-20.9, adult: Secondary | ICD-10-CM | POA: Diagnosis not present

## 2021-05-04 DIAGNOSIS — M542 Cervicalgia: Secondary | ICD-10-CM | POA: Diagnosis not present

## 2021-05-06 ENCOUNTER — Ambulatory Visit: Payer: Medicaid Other | Admitting: Family

## 2021-05-10 ENCOUNTER — Ambulatory Visit: Payer: Medicaid Other | Admitting: Internal Medicine

## 2021-05-18 ENCOUNTER — Other Ambulatory Visit: Payer: Self-pay

## 2021-05-18 ENCOUNTER — Encounter: Payer: Self-pay | Admitting: Family

## 2021-05-18 ENCOUNTER — Ambulatory Visit: Payer: Medicaid Other | Admitting: Family

## 2021-05-18 VITALS — BP 116/71 | HR 74 | Temp 98.2°F | Ht 72.0 in | Wt 147.6 lb

## 2021-05-18 DIAGNOSIS — M4322 Fusion of spine, cervical region: Secondary | ICD-10-CM

## 2021-05-18 DIAGNOSIS — E162 Hypoglycemia, unspecified: Secondary | ICD-10-CM | POA: Diagnosis not present

## 2021-05-18 DIAGNOSIS — J439 Emphysema, unspecified: Secondary | ICD-10-CM

## 2021-05-18 DIAGNOSIS — Z23 Encounter for immunization: Secondary | ICD-10-CM

## 2021-05-18 DIAGNOSIS — G4700 Insomnia, unspecified: Secondary | ICD-10-CM

## 2021-05-18 DIAGNOSIS — F411 Generalized anxiety disorder: Secondary | ICD-10-CM | POA: Diagnosis not present

## 2021-05-18 DIAGNOSIS — I10 Essential (primary) hypertension: Secondary | ICD-10-CM | POA: Diagnosis not present

## 2021-05-18 DIAGNOSIS — G8929 Other chronic pain: Secondary | ICD-10-CM

## 2021-05-18 DIAGNOSIS — M545 Low back pain, unspecified: Secondary | ICD-10-CM | POA: Diagnosis not present

## 2021-05-18 DIAGNOSIS — F331 Major depressive disorder, recurrent, moderate: Secondary | ICD-10-CM | POA: Diagnosis not present

## 2021-05-18 LAB — BAYER DCA HB A1C WAIVED: HB A1C (BAYER DCA - WAIVED): 5.6 % (ref 4.8–5.6)

## 2021-05-18 MED ORDER — BLOOD GLUCOSE METER KIT: PACK | 0 refills | Status: DC

## 2021-05-18 MED ORDER — METOPROLOL TARTRATE 50 MG PO TABS: 50.0000 mg | ORAL_TABLET | Freq: Two times a day (BID) | ORAL | 1 refills | Status: DC

## 2021-05-18 MED ORDER — TRAZODONE HCL 50 MG PO TABS: 50.0000 mg | ORAL_TABLET | Freq: Every evening | ORAL | 3 refills | Status: DC | PRN

## 2021-05-18 NOTE — Patient Instructions (Signed)
Hypoglycemia Hypoglycemia occurs when the level of sugar (glucose) in the blood is too low. Hypoglycemia can happen in people who have or do not have diabetes. It can develop quickly, and it can be a medical emergency. For most people, a blood glucose level below 70 mg/dL (3.9 mmol/L) is considered hypoglycemia. Glucose is a type of sugar that provides the body's main source of energy. Certain hormones (insulin and glucagon) control the level of glucose in the blood. Insulin lowers blood glucose, and glucagon raises blood glucose. Hypoglycemia can result from having too much insulin in the bloodstream, or from not eating enough food that contains glucose. You may also have reactive hypoglycemia, which happens within 4 hours after eating a meal. What are the causes? Hypoglycemia occurs most often in people who have diabetes and may be caused by: Diabetes medicine. Not eating enough, or not eating often enough. Increased physical activity. Drinking alcohol on an empty stomach. If you do not have diabetes, hypoglycemia may be caused by: A tumor in the pancreas. Not eating enough, or not eating for long periods at a time (fasting). A severe infection or illness. Problems after having bariatric surgery. Organ failure, such as kidney or liver failure. Certain medicines. What increases the risk? Hypoglycemia is more likely to develop in people who: Have diabetes and take medicines to lower blood glucose. Abuse alcohol. Have a severe illness. What are the signs or symptoms? Symptoms vary depending on whether the condition is mild, moderate, or severe. Mild hypoglycemia Hunger. Sweating and feeling clammy. Dizziness or feeling light-headed. Sleepiness or restless sleep. Nausea. Increased heart rate. Headache. Blurry vision. Mood changes, such as irritability or anxiety. Tingling or numbness around the mouth, lips, or tongue. Moderate hypoglycemia Confusion and poor judgment. Behavior  changes. Weakness. Irregular heartbeat. A change in coordination. Severe hypoglycemia Severe hypoglycemia is a medical emergency. It can cause: Fainting. Seizures. Loss of consciousness (coma). Death. How is this diagnosed? Hypoglycemia is diagnosed with a blood test to measure your blood glucose level. This blood test is done while you are having symptoms. Your health care provider may also do a physical exam and review your medical history. How is this treated? This condition can be treated by immediately eating or drinking something that contains sugar with 15 grams of fast-acting carbohydrate, such as: 4 oz (120 mL) of fruit juice. 4 oz (120 mL) of regular soda (not diet soda). Several pieces of hard candy. Check food labels to find out how many pieces to eat for 15 grams. 1 Tbsp (15 mL) of sugar or honey. 4 glucose tablets. 1 tube of glucose gel. Treating hypoglycemia if you have diabetes If you are alert and able to swallow safely, follow the 15:15 rule: Take 15 grams of a fast-acting carbohydrate. Talk with your health care provider about how much you should take. Options for getting 15 grams of fast-acting carbohydrate include: Glucose tablets (take 4 tablets). Several pieces of hard candy. Check food labels to find out how many pieces to eat for 15 grams. 4 oz (120 mL) of fruit juice. 4 oz (120 mL) of regular soda (not diet soda). 1 Tbsp (15 mL) of sugar or honey. 1 tube of glucose gel. Check your blood glucose 15 minutes after you take the carbohydrate. If the repeat blood glucose level is still at or below 70 mg/dL (3.9 mmol/L), take 15 grams of a carbohydrate again. If your blood glucose level does not increase above 70 mg/dL (3.9 mmol/L) after 3 tries, seek emergency  medical care. After your blood glucose level returns to normal, eat a meal or a snack within 1 hour.  Treating severe hypoglycemia Severe hypoglycemia is when your blood glucose level is below 54 mg/dL (3  mmol/L). Severe hypoglycemia is a medical emergency. Get medical help right away. If you have severe hypoglycemia and you cannot eat or drink, you will need to be given glucagon. A family member or close friend should learn how to check your blood glucose and how to give you glucagon. Ask your health care provider if you need to have an emergency glucagon kit available. Severe hypoglycemia may need to be treated in a hospital. The treatment may include getting glucose through an IV. You may also need treatment for the cause of your hypoglycemia. Follow these instructions at home: General instructions Take over-the-counter and prescription medicines only as told by your health care provider. Monitor your blood glucose as told by your health care provider. If you drink alcohol: Limit how much you have to: 0-1 drink a day for women who are not pregnant. 0-2 drinks a day for men. Know how much alcohol is in your drink. In the [place] one drink equals one 12 oz bottle of beer (355 mL), one 5 oz glass of wine (148 mL), or one 1 oz glass of hard liquor (44 mL). Be sure to eat food along with drinking alcohol. Be aware that alcohol is absorbed quickly and may have lingering effects that may result in hypoglycemia later. Be sure to do ongoing glucose monitoring. Keep all follow-up visits. This is important. If you have diabetes: Always have a fast-acting carbohydrate (15 grams) option with you to treat low blood glucose. Follow your diabetes management plan as directed by your health care provider. Make sure you: Know the symptoms of hypoglycemia. It is important to treat it right away to prevent it from becoming severe. Check your blood glucose as often as told. Always check before and after exercise. Always check your blood glucose before you drive a motorized vehicle. Take your medicines as told. Follow your meal plan. Eat on time, and do not skip meals. Share your diabetes management plan with  people in your workplace, school, and household. Carry a medical alert card or wear medical alert jewelry. Where to find more information American Diabetes Association: www.diabetes.org Contact a health care provider if: You have problems keeping your blood glucose in your target range. You have frequent episodes of hypoglycemia. Get help right away if: You continue to have hypoglycemia symptoms after eating or drinking something that contains 15 grams of fast-acting carbohydrate, and you cannot get your blood glucose above 70 mg/dL (3.9 mmol/L) while following the 15:15 rule. Your blood glucose is below 54 mg/dL (3 mmol/L). You have a seizure. You faint. These symptoms may represent a serious problem that is an emergency. Do not wait to see if the symptoms will go away. Get medical help right away. Call your local emergency services (911 in the [place] Do not drive yourself to the hospital. Summary Hypoglycemia occurs when the level of sugar (glucose) in the blood is too low. Hypoglycemia can happen in people who have or do not have diabetes. It can develop quickly, and it can be a medical emergency. Make sure you know the symptoms of hypoglycemia and how to treat it. Always have a fast-acting carbohydrate option with you to treat low blood sugar. This information is not intended to replace advice given to you by your health care provider. Make   sure you discuss any questions you have with your health care provider. Document Revised: 06/18/2020 Document Reviewed: 06/18/2020 Elsevier Patient Education  2022 [employer].

## 2021-05-18 NOTE — Progress Notes (Signed)
Subjective:    Patient ID: Bradley Rice, male    DOB: [birth year], 60 y.o.   MRN: [id number]  Chief Complaint  Patient presents with   Medical Management of Chronic Issues   Pt presents to the office today for chronic follow up.  He is followed by Pain clinic every  month. He is followed by Pulmonologist for idiopathic pulmonary fibrosis every 3 months. Followed by Neurosurgery every month for neck pain.   He feels like his "sugar" has dropped several times over the last month. He feels better after drinking a Mt Dew.  Neck Pain  This is a chronic problem. The current episode started more than 1 year ago. The problem occurs intermittently. The pain is at a severity of 0/10 (since surgery). The patient is experiencing no pain.  Hypertension This is a chronic problem. The current episode started more than 1 year ago. The problem has been resolved since onset. The problem is controlled. Associated symptoms include anxiety, malaise/fatigue, neck pain and shortness of breath. Pertinent negatives include no peripheral edema. Risk factors for coronary artery disease include dyslipidemia and male gender. The current treatment provides moderate improvement.  Anxiety Presents for follow-up visit. Symptoms include depressed mood, excessive worry, insomnia, irritability, nervous/anxious behavior, restlessness and shortness of breath. Symptoms occur most days. The severity of symptoms is moderate.    Depression        This is a chronic problem.  The current episode started more than 1 year ago.   The problem occurs intermittently.  Associated symptoms include helplessness, hopelessness, insomnia and restlessness.  Past medical history includes anxiety.   Insomnia Primary symptoms: difficulty falling asleep, frequent awakening, malaise/fatigue.   The current episode started more than one year. The onset quality is gradual. The problem occurs intermittently. PMH includes: depression.   COPD    Review of  Systems  Constitutional:  Positive for irritability and malaise/fatigue.  Respiratory:  Positive for shortness of breath.   Musculoskeletal:  Positive for neck pain.  Psychiatric/Behavioral:  Positive for depression. The patient is nervous/anxious and has insomnia.   All other systems reviewed and are negative.     Objective:   Physical Exam Vitals reviewed.  Constitutional:      General: He is not in acute distress.    Appearance: He is well-developed.  HENT:     Head: Normocephalic.     Right Ear: Tympanic membrane normal.     Left Ear: Tympanic membrane normal.  Eyes:     General:        Right eye: No discharge.        Left eye: No discharge.     Pupils: Pupils are equal, round, and reactive to light.  Neck:     Thyroid: No thyromegaly.  Cardiovascular:     Rate and Rhythm: Normal rate and regular rhythm.     Heart sounds: Normal heart sounds. No murmur heard. Pulmonary:     Effort: Pulmonary effort is normal. No respiratory distress.     Breath sounds: Normal breath sounds. No wheezing.  Abdominal:     General: Bowel sounds are normal. There is no distension.     Palpations: Abdomen is soft.     Tenderness: There is no abdominal tenderness.  Musculoskeletal:        General: No tenderness. Normal range of motion.     Cervical back: Normal range of motion and neck supple.  Skin:    General: Skin is warm and dry.  Findings: No erythema or rash.  Neurological:     Mental Status: He is alert and oriented to person, place, and time.     Cranial Nerves: No cranial nerve deficit.     Deep Tendon Reflexes: Reflexes are normal and symmetric.  Psychiatric:        Behavior: Behavior normal.        Thought Content: Thought content normal.        Judgment: Judgment normal.         BP 116/71   Pulse 74   Temp 98.2 F (36.8 C) (Temporal)   Ht 6' (1.829 m)   Wt 147 lb 9.6 oz (67 kg)   BMI 20.02 kg/m   Assessment & Plan:  Bradley Rice comes in today with chief  complaint of Medical Management of Chronic Issues   Diagnosis and orders addressed:  1. Primary hypertension - metoprolol tartrate (LOPRESSOR) 50 MG tablet; Take 1 tablet (50 mg total) by mouth 2 (two) times daily.  Dispense: 60 tablet; Refill: 1 - CMP14+EGFR - CBC with Differential/Platelet  2. Pulmonary emphysema, unspecified emphysema type ([place]) - CMP14+EGFR - CBC with Differential/Platelet  3. Fusion of spine, cervical region - CMP14+EGFR - CBC with Differential/Platelet  4. Chronic bilateral low back pain, unspecified whether sciatica present - CMP14+EGFR - CBC with Differential/Platelet  5. GAD (generalized anxiety disorder) - CMP14+EGFR - CBC with Differential/Platelet  6. Moderate episode of recurrent major depressive disorder (HCC) - CMP14+EGFR - CBC with Differential/Platelet  7. Hypoglycemia Labs pending  - Bayer DCA Hb A1c Waived - CMP14+EGFR - CBC with Differential/Platelet - blood glucose meter kit and supplies; Dispense based on patient and insurance preference. Use up to four times daily as directed. (FOR ICD-10 E10.9, E11.9).  Dispense: 1 each; Refill: 0  8. Insomnia, unspecified type Start trazodone  Sleep ritual discussed  - traZODone (DESYREL) 50 MG tablet; Take 1-2 tablets (50-100 mg total) by mouth at bedtime as needed for sleep.  Dispense: 60 tablet; Refill: 3   Labs pending Health Maintenance reviewed Diet and exercise encouraged  Follow up plan: 3 months    Evelina Dun, FNP

## 2021-05-19 LAB — CMP14+EGFR
ALT: 7 IU/L (ref 0–44)
AST: 12 IU/L (ref 0–40)
Albumin/Globulin Ratio: 1.4 (ref 1.2–2.2)
Albumin: 4.4 g/dL (ref 3.8–4.9)
Alkaline Phosphatase: 85 IU/L (ref 44–121)
BUN/Creatinine Ratio: 9 (ref 9–20)
BUN: 6 mg/dL (ref 6–24)
Bilirubin Total: 0.4 mg/dL (ref 0.0–1.2)
CO2: 25 mmol/L (ref 20–29)
Calcium: 9.5 mg/dL (ref 8.7–10.2)
Chloride: 101 mmol/L (ref 96–106)
Creatinine, Ser: 0.65 mg/dL — ABNORMAL LOW (ref 0.76–1.27)
Globulin, Total: 3.1 g/dL (ref 1.5–4.5)
Glucose: 92 mg/dL (ref 70–99)
Potassium: 5 mmol/L (ref 3.5–5.2)
Sodium: 140 mmol/L (ref 134–144)
Total Protein: 7.5 g/dL (ref 6.0–8.5)
eGFR: 109 mL/min/{1.73_m2} (ref >59)

## 2021-05-19 LAB — CBC WITH DIFFERENTIAL/PLATELET
Basophils Absolute: 0.2 10*3/uL (ref 0.0–0.2)
Basos: 1 %
EOS (ABSOLUTE): 0.2 10*3/uL (ref 0.0–0.4)
Eos: 2 %
Hematocrit: 43.4 % (ref 37.5–51.0)
Hemoglobin: 14.8 g/dL (ref 13.0–17.7)
Immature Grans (Abs): 0 10*3/uL (ref 0.0–0.1)
Immature Granulocytes: 0 %
Lymphocytes Absolute: 2.4 10*3/uL (ref 0.7–3.1)
Lymphs: 21 %
MCH: 32 pg (ref 26.6–33.0)
MCHC: 34.1 g/dL (ref 31.5–35.7)
MCV: 94 fL (ref 79–97)
Monocytes Absolute: 0.9 10*3/uL (ref 0.1–0.9)
Monocytes: 8 %
Neutrophils Absolute: 7.6 10*3/uL — ABNORMAL HIGH (ref 1.4–7.0)
Neutrophils: 68 %
Platelets: 344 10*3/uL (ref 150–450)
RBC: 4.62 x10E6/uL (ref 4.14–5.80)
RDW: 12.1 % (ref 11.6–15.4)
WBC: 11.3 10*3/uL — ABNORMAL HIGH (ref 3.4–10.8)

## 2021-05-28 DIAGNOSIS — Z79899 Other long term (current) drug therapy: Secondary | ICD-10-CM | POA: Diagnosis not present

## 2021-05-31 DIAGNOSIS — R634 Abnormal weight loss: Secondary | ICD-10-CM | POA: Diagnosis not present

## 2021-06-01 ENCOUNTER — Other Ambulatory Visit: Payer: Self-pay

## 2021-06-01 ENCOUNTER — Ambulatory Visit: Payer: Medicaid Other | Admitting: Family Medicine

## 2021-06-01 ENCOUNTER — Encounter: Payer: Self-pay | Admitting: Family Medicine

## 2021-06-01 VITALS — BP 103/67 | HR 94 | Ht 72.0 in | Wt 148.0 lb

## 2021-06-01 DIAGNOSIS — J439 Emphysema, unspecified: Secondary | ICD-10-CM | POA: Diagnosis not present

## 2021-06-01 DIAGNOSIS — J84112 Idiopathic pulmonary fibrosis: Secondary | ICD-10-CM | POA: Diagnosis not present

## 2021-06-01 DIAGNOSIS — M7021 Olecranon bursitis, right elbow: Secondary | ICD-10-CM

## 2021-06-01 DIAGNOSIS — R051 Acute cough: Secondary | ICD-10-CM | POA: Diagnosis not present

## 2021-06-01 NOTE — Progress Notes (Signed)
Subjective:  Patient ID: Bradley Rice, male    DOB: 02-13-1961, 60 y.o.   MRN: [id number]  Patient Care Team: Sharion Balloon, FNP as PCP - General (Family Medicine) Melissa Montane, RN as Case Manager Jeanella Anton, NP as Nurse Practitioner (Nurse Practitioner)   Chief Complaint:  right elbow, lump   HPI: Bradley Rice is a 60 y.o. male presenting on 06/01/2021 for right elbow, lump   Pt presents today with complaints of right elbow swelling and pain. States onset about 2-3 weeks ago. Denies injury. No fever, chills, weakness, loss of function, or known injury.   Arm Pain  There was no injury mechanism. The pain is present in the right elbow. The quality of the pain is described as aching. The pain does not radiate. The pain is at a severity of 5/10. The pain is moderate. The pain has been Fluctuating since the incident. Pertinent negatives include no chest pain, muscle weakness, numbness or tingling. The symptoms are aggravated by movement and palpation. He has tried nothing for the symptoms.   Relevant past medical, surgical, family, and social history reviewed and updated as indicated.  Allergies and medications reviewed and updated. Data reviewed: Chart in Epic.   Past Medical History:  Diagnosis Date   ALS (amyotrophic lateral sclerosis) (HCC)    Asthma    Back pain    COPD (chronic obstructive pulmonary disease) (HCC)    Coronary artery calcification seen on CT scan    DDD (degenerative disc disease), lumbar    Depression    Dysphagia    Following previous surgery   History of blood transfusion 2014   Post-op bleed   History of lung cancer    History of stroke 2015   Idiopathic pulmonary fibrosis ([place])    Migraine    Spider bite 2012    Past Surgical History:  Procedure Laterality Date   ANTERIOR CERVICAL DECOMP/DISCECTOMY FUSION  12/22/2011   Procedure: ANTERIOR CERVICAL DECOMPRESSION/DISCECTOMY FUSION 3 LEVELS;  Surgeon: Melina Schools, MD;  Location: [place];  Service: Orthopedics;  Laterality: N/A;  ACDF C5-T1   ANTERIOR CERVICAL DECOMP/DISCECTOMY FUSION  06/25/2012   Procedure: ANTERIOR CERVICAL DECOMPRESSION/DISCECTOMY FUSION 1 LEVEL/HARDWARE REMOVAL;  Surgeon: [name], MD;  Location: [place];  Service: Orthopedics;  Laterality: N/A;  Removal anterior cervical plate C5-T1, Explore Fusion, Left C5-6, C6-7, C7-T1 Re-do Foraminotomy, Left open carpal tunnel release with release of ulnar nerve at [facility], Posterior Cervical Fusion with lateral mass screw   ANTERIOR CERVICAL DECOMP/DISCECTOMY FUSION N/A 03/25/2021   Procedure: ANTERIOR CERVICAL DECOMPRESSION/DISCECTOMY FUSION 2 LEVELS  C3-5;  Surgeon: Melina Schools, MD;  Location: [place];  Service: Orthopedics;  Laterality: N/A;  ANTERIOR CERVICAL DECOMPRESSION/DISCECTOMY FUSION 2 LEVELS  C3-5   BACK SURGERY     CARDIAC CATHETERIZATION     CARPAL TUNNEL RELEASE  06/25/2012   Procedure: CARPAL TUNNEL RELEASE;  Surgeon: [name], MD;  Location: [place];  Service: Orthopedics;  Laterality: Left;  as above   CHOLECYSTECTOMY  2009   CORONARY ANGIOPLASTY  2001   [facility]   LUMBAR FUSION  2000   POSTERIOR CERVICAL FUSION/FORAMINOTOMY  06/25/2012   Procedure: POSTERIOR CERVICAL FUSION/FORAMINOTOMY LEVEL 1;  Surgeon: [name], MD;  Location: [place];  Service: Orthopedics;  Laterality: N/A;  as above   POSTERIOR CERVICAL FUSION/FORAMINOTOMY N/A 12/17/2013   Procedure: REMOVAL OF POSTERIOR CERVICAL FUSION LATERAL MASS SCREWS AND RODS C5-T1, EXPLORE FUSION, LEFT SIX-SEVEN FORAMINOTOMY;  Surgeon: [name], MD;  Location: [place];  Service: Orthopedics;  Laterality: N/A;   VASECTOMY  1990    Social History   Socioeconomic History   Marital status: Married    Spouse name: Hassan Rowan   Number of children: 2   Years of education: 10th   Highest education level: Not on file  Occupational History   Occupation: Disability    Employer: [employer]  Tobacco Use   Smoking status: Former     Packs/day: 1.00    Years: 30.00    Pack years: 30.00    Types: Cigarettes    Quit date: 10/2015    Years since quitting: 5.5   Smokeless tobacco: Never  Vaping Use   Vaping Use: Never used  Substance and Sexual Activity   Alcohol use: No    Alcohol/week: 0.0 standard drinks   Drug use: No   Sexual activity: Yes  Other Topics Concern   Not on file  Social History Narrative   Patient lives at home with his spouse.   Caffeine Use: coffee   Social Determinants of Health   Financial Resource Strain: Not on file  Food Insecurity: Not on file  Transportation Needs: Not on file  Physical Activity: Not on file  Stress: Not on file  Social Connections: Not on file  Intimate Partner Violence: Not on file    Outpatient Encounter Medications as of 06/01/2021  Medication Sig   albuterol (PROVENTIL) (2.5 MG/3ML) 0.083% nebulizer solution Take 3 mLs (2.5 mg total) by nebulization every 6 (six) hours as needed for wheezing or shortness of breath.   albuterol (VENTOLIN HFA) 108 (90 Base) MCG/ACT inhaler Inhale 2 puffs into the lungs every 6 (six) hours as needed for wheezing or shortness of breath.   blood glucose meter kit and supplies Dispense based on patient and insurance preference. Use up to four times daily as directed. (FOR ICD-10 E10.9, E11.9).   feeding supplement (ENSURE ENLIVE / ENSURE PLUS) LIQD Take 237 mLs by mouth 2 (two) times daily between meals. (Patient taking differently: Take 237 mLs by mouth 3 (three) times daily.)   methocarbamol (ROBAXIN) 750 MG tablet Take 1 tablet (750 mg total) by mouth 2 (two) times daily as needed. (Patient taking differently: Take 750 mg by mouth 3 (three) times daily as needed for muscle spasms.)   metoprolol tartrate (LOPRESSOR) 50 MG tablet Take 1 tablet (50 mg total) by mouth 2 (two) times daily.   Misc. Devices (CRUTCH-MATE ADULT FOREARM) MISC 1 application by Does not apply route daily.   naloxone (NARCAN) nasal spray 4 mg/0.1 mL Place 1  spray into the nose as needed (opioid overdose).   Nintedanib (OFEV) 150 MG CAPS Take 1 capsule (150 mg total) by mouth 2 (two) times daily.   ondansetron (ZOFRAN) 4 MG tablet Take 1 tablet (4 mg total) by mouth every 8 (eight) hours as needed for nausea or vomiting.   oxyCODONE (ROXICODONE) 15 MG immediate release tablet Take 15 mg by mouth 4 (four) times daily.   oxyCODONE-acetaminophen (PERCOCET/ROXICET) 5-325 MG tablet Take 1 tablet by mouth 3 (three) times daily as needed (breakthrough pain).   tiotropium (SPIRIVA) 18 MCG inhalation capsule Place 1 capsule (18 mcg total) into inhaler and inhale daily.   traZODone (DESYREL) 50 MG tablet Take 1-2 tablets (50-100 mg total) by mouth at bedtime as needed for sleep.   No facility-administered encounter medications on file as of 06/01/2021.    Allergies  Allergen Reactions   Pirfenidone Nausea  And Vomiting and Other (See Comments)    Weight loss    Review of Systems  Constitutional:  Negative for activity change, appetite change, chills, diaphoresis, fatigue, fever and unexpected weight change.  HENT: Negative.    Eyes: Negative.   Respiratory:  Negative for cough, chest tightness and shortness of breath.   Cardiovascular:  Negative for chest pain, palpitations and leg swelling.  Gastrointestinal:  Negative for abdominal pain, blood in stool, constipation, diarrhea, nausea and vomiting.  Endocrine: Negative.   Genitourinary:  Negative for dysuria, frequency and urgency.  Musculoskeletal:  Positive for arthralgias and joint swelling. Negative for back pain, gait problem, myalgias, neck pain and neck stiffness.  Skin: Negative.   Allergic/Immunologic: Negative.   Neurological:  Negative for dizziness, tingling, weakness, numbness and headaches.  Hematological: Negative.   Psychiatric/Behavioral:  Negative for confusion, hallucinations, sleep disturbance and suicidal ideas.   All other systems reviewed and are negative.      Objective:   BP 103/67   Pulse 94   Ht 6' (1.829 m)   Wt 148 lb (67.1 kg)   SpO2 95%   BMI 20.07 kg/m    Wt Readings from Last 3 Encounters:  06/01/21 148 lb (67.1 kg)  05/18/21 147 lb 9.6 oz (67 kg)  03/25/21 152 lb (68.9 kg)    Physical Exam Vitals and nursing note reviewed.  Constitutional:      General: He is not in acute distress.    Appearance: Normal appearance. He is well-developed and well-groomed. He is not ill-appearing, toxic-appearing or diaphoretic.  HENT:     Head: Normocephalic and atraumatic.     Jaw: There is normal jaw occlusion.     Right Ear: Hearing normal.     Left Ear: Hearing normal.     Nose: Nose normal.     Mouth/Throat:     Lips: Pink.     Mouth: Mucous membranes are moist.     Pharynx: Oropharynx is clear. Uvula midline.  Eyes:     General: Lids are normal.     Extraocular Movements: Extraocular movements intact.     Conjunctiva/sclera: Conjunctivae normal.     Pupils: Pupils are equal, round, and reactive to light.  Neck:     Thyroid: No thyroid mass, thyromegaly or thyroid tenderness.     Vascular: No carotid bruit or JVD.     Trachea: Trachea and phonation normal.  Cardiovascular:     Rate and Rhythm: Normal rate and regular rhythm.     Chest Wall: PMI is not displaced.     Pulses: Normal pulses.     Heart sounds: Normal heart sounds. No murmur heard.   No friction rub. No gallop.  Pulmonary:     Effort: Pulmonary effort is normal. No respiratory distress.     Breath sounds: Normal breath sounds. No wheezing.  Abdominal:     General: There is no abdominal bruit.     Palpations: There is no hepatomegaly or splenomegaly.  Musculoskeletal:        General: Normal range of motion.     Right upper arm: Normal.     Right elbow: Swelling present. No deformity, effusion or lacerations. Normal range of motion. Tenderness present in olecranon process. No radial head, medial epicondyle or lateral epicondyle tenderness.     Left elbow: Normal.     Right  forearm: Normal.       Arms:     Cervical back: Neck supple.     Right lower leg:  No edema.     Left lower leg: No edema.     Comments: Fluctuant, tender mass over right olecranon process. Mild erythema, no drainage.    Lymphadenopathy:     Cervical: No cervical adenopathy.  Skin:    General: Skin is warm and dry.     Capillary Refill: Capillary refill takes less than 2 seconds.     Coloration: Skin is not cyanotic, jaundiced or pale.     Findings: No rash.  Neurological:     General: No focal deficit present.     Mental Status: He is alert and oriented to person, place, and time.     Sensory: Sensation is intact.     Motor: Motor function is intact.     Coordination: Coordination is intact.     Gait: Gait is intact.     Deep Tendon Reflexes: Reflexes are normal and symmetric.  Psychiatric:        Attention and Perception: Attention and perception normal.        Mood and Affect: Mood and affect normal.        Speech: Speech normal.        Behavior: Behavior normal. Behavior is cooperative.        Thought Content: Thought content normal.        Cognition and Memory: Cognition and memory normal.        Judgment: Judgment normal.    Results for orders placed or performed in visit on 05/18/21  Bayer DCA Hb A1c Waived  Result Value Ref Range   HB A1C (BAYER DCA - WAIVED) 5.6 4.8 - 5.6 %  CMP14+EGFR  Result Value Ref Range   Glucose 92 70 - 99 mg/dL   BUN 6 6 - 24 mg/dL   Creatinine, Ser 0.65 (L) 0.76 - 1.27 mg/dL   eGFR 109 >59 mL/min/1.73   BUN/Creatinine Ratio 9 9 - 20   Sodium 140 134 - 144 mmol/L   Potassium 5.0 3.5 - 5.2 mmol/L   Chloride 101 96 - 106 mmol/L   CO2 25 20 - 29 mmol/L   Calcium 9.5 8.7 - 10.2 mg/dL   Total Protein 7.5 6.0 - 8.5 g/dL   Albumin 4.4 3.8 - 4.9 g/dL   Globulin, Total 3.1 1.5 - 4.5 g/dL   Albumin/Globulin Ratio 1.4 1.2 - 2.2   Bilirubin Total 0.4 0.0 - 1.2 mg/dL   Alkaline Phosphatase 85 44 - 121 IU/L   AST 12 0 - 40 IU/L   ALT 7 0 - 44  IU/L  CBC with Differential/Platelet  Result Value Ref Range   WBC 11.3 (H) 3.4 - 10.8 x10E3/uL   RBC 4.62 4.14 - 5.80 x10E6/uL   Hemoglobin 14.8 13.0 - 17.7 g/dL   Hematocrit 43.4 37.5 - 51.0 %   MCV 94 79 - 97 fL   MCH 32.0 26.6 - 33.0 pg   MCHC 34.1 31.5 - 35.7 g/dL   RDW 12.1 11.6 - 15.4 %   Platelets 344 150 - 450 x10E3/uL   Neutrophils 68 Not Estab. %   Lymphs 21 Not Estab. %   Monocytes 8 Not Estab. %   Eos 2 Not Estab. %   Basos 1 Not Estab. %   Neutrophils Absolute 7.6 (H) 1.4 - 7.0 x10E3/uL   Lymphocytes Absolute 2.4 0.7 - 3.1 x10E3/uL   Monocytes Absolute 0.9 0.1 - 0.9 x10E3/uL   EOS (ABSOLUTE) 0.2 0.0 - 0.4 x10E3/uL   Basophils Absolute 0.2 0.0 - 0.2 x10E3/uL  Immature Granulocytes 0 Not Estab. %   Immature Grans (Abs) 0.0 0.0 - 0.1 x10E3/uL     Joint Injection/Arthrocentesis  Date/Time: 06/01/2021 2:41 PM Performed by: Baruch Gouty, FNP Authorized by: Baruch Gouty, FNP  Indications: joint swelling and pain (verbal consent obtained after discussed risks and benefits of procedure)  Body area: elbow Joint: right elbow Local anesthesia used: yes  Anesthesia: Local anesthesia used: yes Local Anesthetic: lidocaine spray  Sedation: Patient sedated: no  Preparation: Patient was prepped and draped in the usual sterile fashion. Needle size: 20 G Ultrasound guidance: no Approach: posterior Aspirate: yellow Aspirate amount: 15 mL Methylprednisolone amount: 20 mg Patient tolerance: patient tolerated the procedure well with no immediate complications     Pertinent labs & imaging results that were available during my care of the patient were reviewed by me and considered in my medical decision making.  Assessment & Plan:  Saige was seen today for right elbow, lump.  Diagnoses and all orders for this visit:  Olecranon bursitis of right elbow Classic olecranon bursitis of right elbow. Fluid aspirated in clinic and sent for evaluation. Fluid not purulent or  cloudy, no indications of septic joint. 20 mg depo-medrol infected into bursa after aspiration of fluid. Tolerated well. Bandage applied. Symptomatic care discussed in detail. Aleve twice daily with food for 10 days. Follow up in office in 2-4 weeks or sooner if new or worsening symptoms present.  -     Synovial Fluid Analysis, Complete -     Joint Injection/Arthrocentesis    Continue all other maintenance medications.  Follow up plan: Return in about 4 weeks (around 06/29/2021), or if symptoms worsen or fail to improve, for bursitis.   Continue healthy lifestyle choices, including diet (rich in fruits, vegetables, and lean proteins, and low in salt and simple carbohydrates) and exercise (at least 30 minutes of moderate physical activity daily).  Educational handout given for elbow bursitis  The above assessment and management plan was discussed with the patient. The patient verbalized understanding of and has agreed to the management plan. Patient is aware to call the clinic if they develop any new symptoms or if symptoms persist or worsen. Patient is aware when to return to the clinic for a follow-up visit. Patient educated on when it is appropriate to go to the emergency department.   Monia Pouch, FNP-C [facility] [phone]

## 2021-06-01 NOTE — Addendum Note (Signed)
Addended by: Baruch Gouty on: 06/01/2021 03:03 PM   Modules accepted: Orders

## 2021-06-05 LAB — SYNOVIAL FLUID PANEL
Eos, Fluid: 0 %
Glucose, Fluid: 66 mg/dL
Lining Cells, Synovial: 0 %
Lymphs, Fluid: 39 %
Macrophages Fld: 42 %
Nuc cell # Fld: 205 cells/uL — ABNORMAL HIGH (ref 0–200)
Polys, Fluid: 19 %
Protein, Fluid: 5 g/dL
RBC, Fluid: 4000 /uL

## 2021-06-28 DIAGNOSIS — Z79899 Other long term (current) drug therapy: Secondary | ICD-10-CM | POA: Diagnosis not present

## 2021-06-29 ENCOUNTER — Encounter: Payer: Self-pay | Admitting: Family

## 2021-06-29 ENCOUNTER — Ambulatory Visit: Payer: Medicaid Other | Admitting: Family

## 2021-06-29 VITALS — BP 120/76 | HR 91 | Temp 97.8°F | Ht 72.0 in | Wt 146.8 lb

## 2021-06-29 DIAGNOSIS — J301 Allergic rhinitis due to pollen: Secondary | ICD-10-CM | POA: Diagnosis not present

## 2021-06-29 DIAGNOSIS — R634 Abnormal weight loss: Secondary | ICD-10-CM | POA: Diagnosis not present

## 2021-06-29 DIAGNOSIS — R739 Hyperglycemia, unspecified: Secondary | ICD-10-CM | POA: Diagnosis not present

## 2021-06-29 DIAGNOSIS — M7021 Olecranon bursitis, right elbow: Secondary | ICD-10-CM | POA: Diagnosis not present

## 2021-06-29 MED ORDER — FEXOFENADINE HCL 180 MG PO TABS: 180.0000 mg | ORAL_TABLET | Freq: Every day | ORAL | 1 refills | Status: DC

## 2021-06-29 MED ORDER — ENSURE COMPLETE PO LIQD: 237.0000 mL | ORAL | 1 refills | Status: DC

## 2021-06-29 MED ORDER — FLUTICASONE PROPIONATE 50 MCG/ACT NA SUSP: 2.0000 | Freq: Every day | NASAL | 6 refills | Status: DC

## 2021-06-29 NOTE — Progress Notes (Signed)
Subjective:    Patient ID: Bradley Rice, male    DOB: 09-Sep-1960, 60 y.o.   MRN: [id number]  Chief Complaint  Patient presents with   Bursitis   Sinus Problem   Blood Sugar Problem    It is high    Pt presents to the office today with several complaints. He was seen in the office on 06/01/21 with bursitis of right elbow. He had 15 ml aspirated and then injected with methylprednisolone. He reports his elbow is feeling good now.    He is also monitoring his glucose at home and states it has been increased to 165-180's fasting. His last A1C was 5.6 on 05/18/21.   He is still drinking ensure daily. His current BMI is 19.  Sinus Problem This is a new problem. The current episode started more than 1 month ago. The problem has been gradually worsening since onset. There has been no fever. His pain is at a severity of 0/10. The pain is mild. Associated symptoms include congestion, headaches, sinus pressure and a sore throat. Pertinent negatives include no coughing or sneezing. Past treatments include saline sprays. The treatment provided mild relief.     Review of Systems  HENT:  Positive for congestion, sinus pressure and sore throat. Negative for sneezing.   Respiratory:  Negative for cough.   Neurological:  Positive for headaches.  All other systems reviewed and are negative.     Objective:   Physical Exam Vitals reviewed.  Constitutional:      General: He is not in acute distress.    Appearance: He is well-developed.  HENT:     Head: Normocephalic.     Right Ear: Tympanic membrane normal.     Left Ear: Tympanic membrane normal.  Eyes:     General:        Right eye: No discharge.        Left eye: No discharge.     Pupils: Pupils are equal, round, and reactive to light.  Neck:     Thyroid: No thyromegaly.  Cardiovascular:     Rate and Rhythm: Normal rate and regular rhythm.     Heart sounds: Normal heart sounds. No murmur heard. Pulmonary:     Effort: Pulmonary effort is  normal. No respiratory distress.     Breath sounds: Wheezing present.  Abdominal:     General: Bowel sounds are normal. There is no distension.     Palpations: Abdomen is soft.     Tenderness: There is no abdominal tenderness.  Musculoskeletal:        General: No tenderness. Normal range of motion.     Cervical back: Normal range of motion and neck supple.  Skin:    General: Skin is warm and dry.     Findings: No erythema or rash.  Neurological:     Mental Status: He is alert and oriented to person, place, and time.     Cranial Nerves: No cranial nerve deficit.     Deep Tendon Reflexes: Reflexes are normal and symmetric.  Psychiatric:        Behavior: Behavior normal.        Thought Content: Thought content normal.        Judgment: Judgment normal.      BP 120/76   Pulse 91   Temp 97.8 F (36.6 C) (Temporal)   Ht 6' (1.829 m)   Wt 146 lb 12.8 oz (66.6 kg)   BMI 19.91 kg/m  Assessment & Plan:  NTHONY LEFFERTS comes in today with chief complaint of Bursitis, Sinus Problem, and Blood Sugar Problem (It is high )   Diagnosis and orders addressed:  1. Allergic rhinitis due to pollen, unspecified seasonality Start allegra and flonase  Avoid allergens  - fexofenadine (ALLEGRA ALLERGY) 180 MG tablet; Take 1 tablet (180 mg total) by mouth daily.  Dispense: 90 tablet; Refill: 1 - fluticasone (FLONASE) 50 MCG/ACT nasal spray; Place 2 sprays into both nostrils daily.  Dispense: 16 g; Refill: 6  2. Hyperglycemia Low carb diet  3. Olecranon bursitis of right elbow Resolved   4. Weight loss, abnormal Continue Ensure daily with meals High protein meals - feeding supplement, ENSURE COMPLETE, (ENSURE COMPLETE) LIQD; Take 237 mLs by mouth daily.  Dispense: 21330 mL; Refill: 1   Health Maintenance reviewed Diet and exercise encouraged  Follow up plan: 3 months   Evelina Dun, FNP

## 2021-06-29 NOTE — Patient Instructions (Signed)
Hyperglycemia Hyperglycemia occurs when the level of sugar (glucose) in the blood is too high. Glucose is a type of sugar that provides the body's main source of energy. Certain hormones (insulin and glucagon) control the level of glucose in the blood. Insulin lowers blood glucose, and glucagon increases blood glucose. Hyperglycemia can result from not having enough insulin in the bloodstream, or from the body not responding normally to insulin. Hyperglycemia occurs most often in people who have diabetes (diabetes mellitus), but it can happen in people who do not have diabetes. It can develop quickly, and it can be life-threatening if it causes you to become severely dehydrated (diabetic ketoacidosis or hyperglycemic hyperosmolar state). Severe hyperglycemia is a medical emergency. For most people with diabetes, a blood glucose level above 240 mg/dL is considered hyperglycemia. What are the causes? If you have diabetes, hyperglycemia may be caused by: Medicines that increase blood glucose or affect your diabetes control. Getting less physical activity. Eating more than planned. Being sick or injured, having an infection, or having surgery. Stress. Not giving yourself enough insulin (if you are taking insulin). If you have undiagnosed diabetes, this may be the reason you have hyperglycemia. If you do not have diabetes, hyperglycemia may be caused by: Certain medicines, including: Steroid medicines. Beta-blockers. Epinephrine. Thiazide diuretics. Stress. Having a serious illness, an infection, or surgery. Diseases of the pancreas. What increases the risk? Hyperglycemia is more likely to develop in people who have risk factors for diabetes, such as: Having a family member with diabetes. Certain conditions in which the body's disease-fighting system (immune system) attacks itself (autoimmune disorders). Being overweight or obese. Having an inactive (sedentary) lifestyle. Having been diagnosed  with insulin resistance. Having a history of prediabetes, gestational diabetes, or polycystic ovarian syndrome (PCOS). What are the signs or symptoms? Hyperglycemia may not cause any symptoms. If you do have symptoms, they may include: Increased thirst. Needing to urinate more often than usual. Hunger. Feeling very tired. Blurry vision. Other symptoms may develop if hyperglycemia gets worse, such as: Dry mouth. Abdominal pain. Loss of appetite. Fruity-smelling breath. Weakness. Unexpected weight loss. Tingling or numbness in the hands or feet. Headache. Cuts or bruises that are slow to heal. How is this diagnosed? Hyperglycemia is diagnosed with a blood test to measure your blood glucose level. This blood test is usually done while you are having symptoms. Your health care provider may also do a physical exam and review your medical history. You may have more tests to determine the cause of your hyperglycemia, such as: A fasting blood glucose (FBG) test. You will not be allowed to eat (you will fast) for at least 8 hours before a blood sample is taken. An A1C blood test. This provides information about blood glucose control over the previous 2-3 months. An oral glucose tolerance test (OGTT). This measures your blood glucose at two times: After fasting. This is your baseline blood glucose level. 2 hours after drinking a beverage that contains glucose. How is this treated? Treatment depends on the cause of your hyperglycemia. Treatment may include: Taking medicine to regulate your blood glucose levels. If you take insulin or other diabetes medicines, your medicine or dosage may be adjusted. Lifestyle changes, such as exercising more, eating healthier foods, or losing weight. Treating an illness or infection. Checking your blood glucose more often. Stopping or reducing steroid medicines. If your hyperglycemia becomes severe and it results in diabetic ketoacidosis or hyperglycemic  hyperosmolar state, you must be hospitalized and given IV fluids  and IV insulin. Follow these instructions at home: General instructions Take over-the-counter and prescription medicines only as told by your health care provider. Do not use any products that contain nicotine or tobacco. These products include cigarettes, chewing tobacco, and vaping devices, such as e-cigarettes. If you need help quitting, ask your health care provider. If you drink alcohol: Limit how much you have to: 0-1 drink a day for women who are not pregnant. 0-2 drinks a day for men. Know how much alcohol is in a drink. In the [place] one drink equals one 12 oz bottle of beer (355 mL), one 5 oz glass of wine (148 mL), or one 1 oz glass of hard liquor (44 mL). Learn to manage stress. If you need help with this, ask your health care provider. Do exercises as told by your health care provider. Keep all follow-up visits. This is important. Eating and drinking  Maintain a healthy weight. Stay hydrated, especially when you exercise, get sick, or spend time in hot temperatures. Drink enough fluid to keep your urine pale yellow. If you have diabetes:  Know the symptoms of hyperglycemia. Follow your diabetes management plan as told by your health care provider. Make sure you: Take your insulin and medicines as told. Follow your exercise plan. Follow your meal plan. Eat on time, and do not skip meals. Check your blood glucose as often as told. Make sure to check your blood glucose before and after exercise. If you exercise longer or in a different way, check your blood glucose more often. Follow your sick day plan whenever you cannot eat or drink normally. Make this plan in advance with your health care provider. Share your diabetes management plan with people in your workplace, school, and household. Check your urine for ketones when you are ill and as told by your health care provider. Carry a medical alert card or wear  medical alert jewelry. Where to find more information American Diabetes Association: www.diabetes.org Contact a health care provider if: Your blood glucose is at or above 240 mg/dL (13.3 mmol/L) for 2 days in a row. You have problems keeping your blood glucose in your target range. You have frequent episodes of hyperglycemia. You have signs of illness, such as nausea, vomiting, or fever. Get help right away if: Your blood glucose monitor reads "high" even when you are taking insulin. You have trouble breathing. You have a change in how you think, feel, or act (mental status). You have nausea or vomiting that does not go away. These symptoms may represent a serious problem that is an emergency. Do not wait to see if the symptoms will go away. Get medical help right away. Call your local emergency services (911 in the [place] Do not drive yourself to the hospital. Summary Hyperglycemia occurs when the level of sugar (glucose) in the blood is too high. Hyperglycemia can happen with or without diabetes, and severe hyperglycemia can be life-threatening. Hyperglycemia is diagnosed with a blood test to measure your blood glucose level. This blood test is usually done while you are having symptoms. Your health care provider may also do a physical exam and review your medical history. If you have diabetes, follow your diabetes management plan as told by your health care provider. Contact your health care provider if you have problems keeping your blood glucose in your target range. This information is not intended to replace advice given to you by your health care provider. Make sure you discuss any questions you have  with your health care provider. Document Revised: 05/01/2020 Document Reviewed: 05/01/2020 Elsevier Patient Education  2022 [employer].

## 2021-06-30 ENCOUNTER — Telehealth: Payer: Self-pay | Admitting: Family Medicine

## 2021-06-30 NOTE — Telephone Encounter (Signed)
This request has received a Favorable outcome.  Please note any additional information provided by [facility] at the bottom of this request.

## 2021-06-30 NOTE — Telephone Encounter (Signed)
SENT TO PLAN

## 2021-07-01 DIAGNOSIS — J84112 Idiopathic pulmonary fibrosis: Secondary | ICD-10-CM | POA: Diagnosis not present

## 2021-07-01 DIAGNOSIS — J439 Emphysema, unspecified: Secondary | ICD-10-CM | POA: Diagnosis not present

## 2021-07-01 DIAGNOSIS — R051 Acute cough: Secondary | ICD-10-CM | POA: Diagnosis not present

## 2021-07-05 ENCOUNTER — Telehealth: Payer: Self-pay | Admitting: Family

## 2021-07-05 DIAGNOSIS — R634 Abnormal weight loss: Secondary | ICD-10-CM

## 2021-07-05 MED ORDER — ENSURE COMPLETE PO LIQD: 237.0000 mL | ORAL | 1 refills | Status: DC

## 2021-07-05 MED ORDER — AMOXICILLIN-POT CLAVULANATE 875-125 MG PO TABS: 1.0000 | ORAL_TABLET | Freq: Two times a day (BID) | ORAL | 0 refills | Status: DC

## 2021-07-05 NOTE — Telephone Encounter (Signed)
Augmentin Prescription sent to pharmacy.  Evelina Dun, FNP

## 2021-07-05 NOTE — Telephone Encounter (Signed)
Pt called stating that he recently saw Alyse Low and was told that if he didn't start feeling any better to call and let her know and she would send some medicine in for him. Pt says he feels worse today than he did at his appt and needs medicine sent in for him.   Pt says to send medicine to [facility].

## 2021-07-05 NOTE — Telephone Encounter (Signed)
Patient aware and verbalized understanding. °

## 2021-07-05 NOTE — Addendum Note (Signed)
Addended by: Ladean Raya on: 07/05/2021 03:15 PM   Modules accepted: Orders

## 2021-07-10 DIAGNOSIS — R634 Abnormal weight loss: Secondary | ICD-10-CM | POA: Diagnosis not present

## 2021-07-27 DIAGNOSIS — Z79899 Other long term (current) drug therapy: Secondary | ICD-10-CM | POA: Diagnosis not present

## 2021-08-01 DIAGNOSIS — J84112 Idiopathic pulmonary fibrosis: Secondary | ICD-10-CM | POA: Diagnosis not present

## 2021-08-01 DIAGNOSIS — R051 Acute cough: Secondary | ICD-10-CM | POA: Diagnosis not present

## 2021-08-01 DIAGNOSIS — J439 Emphysema, unspecified: Secondary | ICD-10-CM | POA: Diagnosis not present

## 2021-08-12 ENCOUNTER — Ambulatory Visit: Payer: Medicaid Other | Admitting: Family

## 2021-08-12 ENCOUNTER — Encounter: Payer: Self-pay | Admitting: Family

## 2021-08-12 VITALS — BP 121/81 | HR 78 | Temp 98.9°F | Ht 72.0 in | Wt 145.0 lb

## 2021-08-12 DIAGNOSIS — M7031 Other bursitis of elbow, right elbow: Secondary | ICD-10-CM

## 2021-08-12 DIAGNOSIS — R058 Other specified cough: Secondary | ICD-10-CM

## 2021-08-12 DIAGNOSIS — J301 Allergic rhinitis due to pollen: Secondary | ICD-10-CM

## 2021-08-12 MED ORDER — DICLOFENAC SODIUM 75 MG PO TBEC: 75.0000 mg | DELAYED_RELEASE_TABLET | Freq: Two times a day (BID) | ORAL | 0 refills | Status: DC

## 2021-08-12 MED ORDER — PREDNISONE 10 MG (21) PO TBPK: ORAL_TABLET | ORAL | 0 refills | Status: DC

## 2021-08-12 NOTE — Patient Instructions (Signed)
Elbow Bursitis °Bursitis is swelling and pain at the tip of the elbow. This happens when fluid builds up in a sac under the skin (bursa). This may also be called olecranon bursitis. °What are the causes? °Elbow bursitis may be caused by: °Elbow injury, such as falling onto the elbow. °Leaning on hard surfaces for long periods of time. °Infection from an injury that breaks the skin near the elbow. °A bone growth (spur) that forms at the tip of the elbow. °A medical condition that causes inflammation, such as gout or rheumatoid arthritis. °Sometimes the cause is not known. °What are the signs or symptoms? °The first sign of elbow bursitis is usually swelling at the tip of the elbow. This can grow to be about the size of a golf ball. Swelling may start suddenly or develop gradually. Other symptoms may include: °Pain when bending or leaning on the elbow. °Not being able to move the elbow normally. °If bursitis is caused by an infection, you may have: °Redness, warmth, and tenderness of the elbow. °Drainage of pus from the swollen area over the elbow, if the skin breaks open. °How is this diagnosed? °This condition may be diagnosed based on: °Your symptoms and medical history. °Any recent injuries you have had. °A physical exam. °X-rays to check for a bone spur or fracture. °Draining fluid from the bursa to test it for infection. °Blood tests to rule out gout or rheumatoid arthritis. °How is this treated? °Treatment for elbow bursitis depends on the cause. Treatment may include: °Medicines. These may include: °Over-the-counter medicines to relieve pain and inflammation. °Antibiotic medicines. °Injections of anti-inflammatory medicines (steroids). °Draining fluid from the bursa. °Wrapping your elbow with a bandage. °Wearing elbow pads. °If these treatments do not help, you may need surgery to remove the bursa. °Follow these instructions at home: °Medicines °Take over-the-counter and prescription medicines only as told by  your health care provider. °If you were prescribed an antibiotic medicine, take it as told by your health care provider. Do not stop taking the antibiotic even if you start to feel better. °Managing pain, stiffness, and swelling ° °If directed, put ice on your elbow: °Put ice in a plastic bag. °Place a towel between your skin and the bag. °Leave the ice on for 20 minutes, 2-3 times a day. °If your bursitis is caused by an injury, rest your elbow and wear your bandage as told by your health care provider. °Use elbow pads or elbow wraps to cushion your elbow as needed. °General instructions °Avoid any activities that cause elbow pain. Ask your health care provider what activities are safe for you. °Keep all follow-up visits as told by your health care provider. This is important. °Contact a health care provider if you have: °A fever. °Symptoms that do not get better with treatment. °Pain or swelling that: °Gets worse. °Goes away and then comes back. °Pus draining from your elbow. °Get help right away if you have: °Trouble moving your arm, hand, or fingers. °Summary °Elbow bursitis is inflammation of the fluid-filled sac (bursa) between the tip of your elbow bone (olecranon) and your skin. °Treatment for elbow bursitis depends on the cause. It may include medicines to relieve pain and inflammation, antibiotic medicines, and draining fluid from your elbow. °Contact a health care provider if your symptoms do not get better with treatment, or if your symptoms go away and then come back. °This information is not intended to replace advice given to you by your health care provider. Make sure   you discuss any questions you have with your health care provider. °Document Revised: 12/25/2019 Document Reviewed: 01/22/2020 °Elsevier Patient Education © 2022 Elsevier Inc. ° °

## 2021-08-12 NOTE — Progress Notes (Signed)
Subjective:    Patient ID: Bradley Rice, male    DOB: 1960-10-08, 61 y.o.   MRN: [id number]  Chief Complaint  Patient presents with   Joint Swelling    Fluid on right elbow   PT presents to the office today with right elbow bursitis that he noticed this week. He states he has had this in the past and was drained. He reports mild aching pain 4-5 out 10.   He states he fell in the shower on Sunday night.   He is complaining of a constant cough and rhinorrhea. He has taken allegra, Flonase, zyrtec, and Claritin with no relief.  Arm Pain  The incident occurred more than 1 week ago. The injury mechanism was a fall. The pain is present in the right elbow. The quality of the pain is described as aching. The pain is moderate. Pertinent negatives include no muscle weakness, numbness or tingling. Nothing aggravates the symptoms. He has tried rest for the symptoms. The treatment provided mild relief.     Review of Systems  Neurological:  Negative for tingling and numbness.  All other systems reviewed and are negative.     Objective:   Physical Exam Vitals reviewed.  Constitutional:      General: He is not in acute distress.    Appearance: He is well-developed.  HENT:     Head: Normocephalic.     Comments: Erythemas throat    Right Ear: Tympanic membrane normal.     Left Ear: Tympanic membrane normal.  Eyes:     General:        Right eye: No discharge.        Left eye: No discharge.     Pupils: Pupils are equal, round, and reactive to light.  Neck:     Thyroid: No thyromegaly.  Cardiovascular:     Rate and Rhythm: Normal rate and regular rhythm.     Heart sounds: Normal heart sounds. No murmur heard. Pulmonary:     Effort: Pulmonary effort is normal. No respiratory distress.     Breath sounds: Normal breath sounds. No wheezing.  Abdominal:     General: Bowel sounds are normal. There is no distension.     Palpations: Abdomen is soft.     Tenderness: There is no abdominal  tenderness.  Musculoskeletal:        General: Tenderness present. Normal range of motion.     Cervical back: Normal range of motion and neck supple.     Comments: Right elbow tenderness   Skin:    General: Skin is warm and dry.     Findings: No erythema or rash.  Neurological:     Mental Status: He is alert and oriented to person, place, and time.     Cranial Nerves: No cranial nerve deficit.     Deep Tendon Reflexes: Reflexes are normal and symmetric.  Psychiatric:        Behavior: Behavior normal.        Thought Content: Thought content normal.        Judgment: Judgment normal.      BP 121/81    Pulse 78    Temp 98.9 F (37.2 C) (Temporal)    Ht 6' (1.829 m)    Wt 145 lb (65.8 kg)    BMI 19.67 kg/m      Assessment & Plan:  Bradley Rice comes in today with chief complaint of Joint Swelling (Fluid on right elbow)   Diagnosis and  orders addressed:  1. Bursitis of right elbow, unspecified bursa Rest Ice No other NSAID's  Avoid injury - diclofenac (VOLTAREN) 75 MG EC tablet; Take 1 tablet (75 mg total) by mouth 2 (two) times daily.  Dispense: 30 tablet; Refill: 0 - predniSONE (STERAPRED UNI-PAK 21 TAB) 10 MG (21) TBPK tablet; Use as directed  Dispense: 21 tablet; Refill: 0  2. Allergic rhinitis due to pollen, unspecified seasonality - Ambulatory referral to Allergy  3. Dry cough - Ambulatory referral to Lund, FNP

## 2021-08-19 ENCOUNTER — Ambulatory Visit: Payer: Medicaid Other | Admitting: Family

## 2021-08-24 DIAGNOSIS — R634 Abnormal weight loss: Secondary | ICD-10-CM | POA: Diagnosis not present

## 2021-08-27 DIAGNOSIS — Z79899 Other long term (current) drug therapy: Secondary | ICD-10-CM | POA: Diagnosis not present

## 2021-08-30 ENCOUNTER — Telehealth: Payer: Self-pay | Admitting: Family

## 2021-08-30 MED ORDER — DULOXETINE HCL 60 MG PO CPEP: 60.0000 mg | ORAL_CAPSULE | Freq: Every day | ORAL | 1 refills | Status: DC

## 2021-08-30 NOTE — Telephone Encounter (Signed)
Prescription sent to pharmacy.

## 2021-08-30 NOTE — Telephone Encounter (Signed)
Patient aware and verbalized understanding. °

## 2021-08-31 ENCOUNTER — Encounter: Payer: Self-pay | Admitting: Family

## 2021-08-31 ENCOUNTER — Ambulatory Visit: Payer: Medicaid Other | Admitting: Family

## 2021-08-31 VITALS — BP 108/70 | HR 78 | Temp 98.1°F | Ht 72.0 in | Wt 148.6 lb

## 2021-08-31 DIAGNOSIS — F331 Major depressive disorder, recurrent, moderate: Secondary | ICD-10-CM

## 2021-08-31 DIAGNOSIS — M545 Low back pain, unspecified: Secondary | ICD-10-CM

## 2021-08-31 DIAGNOSIS — J84112 Idiopathic pulmonary fibrosis: Secondary | ICD-10-CM | POA: Diagnosis not present

## 2021-08-31 DIAGNOSIS — I10 Essential (primary) hypertension: Secondary | ICD-10-CM

## 2021-08-31 DIAGNOSIS — Z87891 Personal history of nicotine dependence: Secondary | ICD-10-CM | POA: Diagnosis not present

## 2021-08-31 DIAGNOSIS — G8929 Other chronic pain: Secondary | ICD-10-CM

## 2021-08-31 DIAGNOSIS — E441 Mild protein-calorie malnutrition: Secondary | ICD-10-CM

## 2021-08-31 DIAGNOSIS — J439 Emphysema, unspecified: Secondary | ICD-10-CM | POA: Diagnosis not present

## 2021-08-31 DIAGNOSIS — F411 Generalized anxiety disorder: Secondary | ICD-10-CM

## 2021-08-31 MED ORDER — ENSURE MAX PROTEIN PO LIQD: 11.0000 [oz_av] | Freq: Every day | ORAL | 2 refills | Status: DC

## 2021-08-31 MED ORDER — BUSPIRONE HCL 5 MG PO TABS: 5.0000 mg | ORAL_TABLET | Freq: Three times a day (TID) | ORAL | 1 refills | Status: DC

## 2021-08-31 NOTE — Progress Notes (Signed)
Subjective:    Patient ID: Bradley Rice, male    DOB: 1960/11/01, 61 y.o.   MRN: [id number]  Chief Complaint  Patient presents with   Medical Management of Chronic Issues   Pt presents to the office today for chronic follow up.  He is followed by Pain clinic every  month for chronic pain, back pian, and neck pain. He is followed by Pulmonologist for idiopathic pulmonary fibrosis every 3 months. Followed by Neurosurgery every month for neck pain.   States his depression is worsening. States he was off his Cymbalta for three months, because it was "never filled". He restarted the Cymbalta yesterday.   He has Emphysema and quit smoking  apporx 2019. Has SOB but also has pulmonary fibrosis.  Hypertension This is a chronic problem. The current episode started more than 1 year ago. The problem has been resolved since onset. The problem is controlled. Associated symptoms include anxiety, malaise/fatigue and shortness of breath. Pertinent negatives include no peripheral edema. Risk factors for coronary artery disease include dyslipidemia, obesity, male gender and sedentary lifestyle. The current treatment provides moderate improvement.  Depression        This is a chronic problem.  The current episode started more than 1 year ago.   The onset quality is gradual.   Associated symptoms include fatigue, helplessness, hopelessness, insomnia, irritable, restlessness, decreased interest and sad.  Past treatments include SNRIs - Serotonin and norepinephrine reuptake inhibitors.  Compliance with treatment is good.  Past medical history includes anxiety.   Anxiety Presents for follow-up visit. Symptoms include depressed mood, excessive worry, insomnia, irritability, nervous/anxious behavior, restlessness and shortness of breath. Symptoms occur most days. The severity of symptoms is moderate.       Review of Systems  Constitutional:  Positive for fatigue, irritability and malaise/fatigue.  Respiratory:   Positive for shortness of breath.   Psychiatric/Behavioral:  Positive for depression. The patient is nervous/anxious and has insomnia.   All other systems reviewed and are negative.     Objective:   Physical Exam Vitals reviewed.  Constitutional:      General: He is irritable. He is not in acute distress.    Appearance: He is well-developed.     Comments: Generalized weakness  HENT:     Head: Normocephalic.     Right Ear: Tympanic membrane normal.     Left Ear: Tympanic membrane normal.  Eyes:     General:        Right eye: No discharge.        Left eye: No discharge.     Pupils: Pupils are equal, round, and reactive to light.  Neck:     Thyroid: No thyromegaly.  Cardiovascular:     Rate and Rhythm: Normal rate and regular rhythm.     Heart sounds: Normal heart sounds. No murmur heard. Pulmonary:     Effort: Pulmonary effort is normal. No respiratory distress.     Breath sounds: Normal breath sounds. No wheezing.  Abdominal:     General: Bowel sounds are normal. There is no distension.     Palpations: Abdomen is soft.     Tenderness: There is no abdominal tenderness.  Musculoskeletal:        General: No tenderness.     Cervical back: Normal range of motion and neck supple.     Comments: Pain in lumbar with flexion and extension  Skin:    General: Skin is warm and dry.     Findings: No erythema  or rash.  Neurological:     Mental Status: He is alert and oriented to person, place, and time.     Cranial Nerves: No cranial nerve deficit.     Deep Tendon Reflexes: Reflexes are normal and symmetric.  Psychiatric:        Behavior: Behavior normal.        Thought Content: Thought content normal.        Judgment: Judgment normal.      BP 108/70    Pulse 78    Temp 98.1 F (36.7 C) (Temporal)    Ht 6' (1.829 m)    Wt 148 lb 9.6 oz (67.4 kg)    BMI 20.15 kg/m      Assessment & Plan:  Bradley Rice comes in today with chief complaint of Medical Management of Chronic  Issues   Diagnosis and orders addressed:  1. Primary hypertension  - CMP14+EGFR - CBC with Differential/Platelet  2. Pulmonary emphysema, unspecified emphysema type ([place]) - CMP14+EGFR - CBC with Differential/Platelet  3. Moderate episode of recurrent major depressive disorder (HCC) Continue Cymbalta and start Buspar 5 mg TID  Stress managment - busPIRone (BUSPAR) 5 MG tablet; Take 1 tablet (5 mg total) by mouth 3 (three) times daily.  Dispense: 90 tablet; Refill: 1 - CMP14+EGFR - CBC with Differential/Platelet  4. Other chronic pain - CMP14+EGFR - CBC with Differential/Platelet  5. GAD (generalized anxiety disorder) - busPIRone (BUSPAR) 5 MG tablet; Take 1 tablet (5 mg total) by mouth 3 (three) times daily.  Dispense: 90 tablet; Refill: 1 - CMP14+EGFR - CBC with Differential/Platelet  6. Hx of smoking - CMP14+EGFR - CBC with Differential/Platelet  7. Chronic bilateral low back pain, unspecified whether sciatica present - CMP14+EGFR - CBC with Differential/Platelet  8. IPF (idiopathic pulmonary fibrosis) (HCC) - CMP14+EGFR - CBC with Differential/Platelet  9. Mild protein-calorie malnutrition (HCC) - CMP14+EGFR - CBC with Differential/Platelet - Ensure Max Protein (ENSURE MAX PROTEIN) LIQD; Take 330 mLs (11 oz total) by mouth daily.  Dispense: 29700 mL; Refill: 2   Labs pending Health Maintenance reviewed Diet and exercise encouraged  Follow up plan: 3 months    Evelina Dun, FNP

## 2021-08-31 NOTE — Patient Instructions (Signed)
Major Depressive Disorder, Adult Major depressive disorder (MDD) is a mental health condition. It may also be called clinical depression or unipolar depression. MDD causes symptoms of sadness, hopelessness, and loss of interest in things. These symptoms last most of the day, almost every day, for 2 weeks. MDD can also cause physical symptoms. It can interfere with relationships and with everyday activities, such as work, school, and activities that are usually pleasant. MDD may be mild, moderate, or severe. It may be single-episode MDD, which happens once, or recurrent MDD, which may occur multiple times. What are the causes? The exact cause of this condition is not known. MDD is most likely caused by a combination of things, which may include: Your personality traits. Learned or conditioned behaviors or thoughts or feelings that reinforce negativity. Any alcohol or substance misuse. Long-term (chronic) physical or mental health illness. Going through a traumatic experience or major life changes. What increases the risk? The following factors may make someone more likely to develop MDD: A family history of depression. Being a woman. Troubled family relationships. Abnormally low levels of certain brain chemicals. Traumatic or painful events in childhood, especially abuse or loss of a parent. A lot of stress from life experiences, such as poor living conditions or discrimination. Chronic physical illness or other mental health disorders. What are the signs or symptoms? The main symptoms of MDD usually include: Constant depressed or irritable mood. A loss of interest in things and activities. Other symptoms include: Sleeping or eating too much or too little. Unexplained weight gain or weight loss. Tiredness or low energy. Being agitated, restless, or weak. Feeling hopeless, worthless, or guilty. Trouble thinking clearly or making decisions. Thoughts of suicide or thoughts of harming  others. Isolating oneself or avoiding other people or activities. Trouble completing tasks, work, or any normal obligations. Severe symptoms of this condition may include: Psychotic depression.This may include false beliefs, or delusions. It may also include seeing, hearing, tasting, smelling, or feeling things that are not real (hallucinations). Chronic depression or persistent depressive disorder. This is low-level depression that lasts for at least 2 years. Melancholic depression, or feeling extremely sad and hopeless. Catatonic depression, which includes trouble speaking and trouble moving. How is this diagnosed? This condition may be diagnosed based on: Your symptoms. Your medical and mental health history. You may be asked questions about your lifestyle, including any drug and alcohol use. A physical exam. Blood tests to rule out other conditions. MDD is confirmed if you have the following symptoms most of the day, nearly every day, in a 2-week period: Either a depressed mood or loss of interest. At least four other MDD symptoms. How is this treated? This condition is usually treated by mental health professionals, such as psychologists, psychiatrists, and clinical social workers. You may need more than one type of treatment. Treatment may include: Psychotherapy, also called talk therapy or counseling. Types of psychotherapy include: Cognitive behavioral therapy (CBT). This teaches you to recognize unhealthy feelings, thoughts, and behaviors, and replace them with positive thoughts and actions. Interpersonal therapy (IPT). This helps you to improve the way you communicate with others or relate to them. Family therapy. This treatment includes members of your family. Medicines to treat anxiety and depression. These medicines help to balance the brain chemicals that affect your emotions. Lifestyle changes. You may be asked to: Limit alcohol use and avoid drug use. Get regular  exercise. Get plenty of sleep. Make healthy eating choices. Spend more time outdoors. Brain stimulation. This may  be done if symptoms are very severe and other treatments have not worked. Examples of this treatment are electroconvulsive therapy and transcranial magnetic stimulation. Follow these instructions at home: Activity Exercise regularly and spend time outdoors. Find activities that you enjoy doing, and make time to do them. Find healthy ways to manage stress, such as: Meditation or deep breathing. Spending time in nature. Journaling. Return to your normal activities as told by your health care provider. Ask your health care provider what activities are safe for you. Alcohol and drug use If you drink alcohol: Limit how much you use to: 0-1 drink a day for women who are not pregnant. 0-2 drinks a day for men. Be aware of how much alcohol is in your drink. In the [place] one drink equals one 12 oz bottle of beer (355 mL), one 5 oz glass of wine (148 mL), or one 1 oz glass of hard liquor (44 mL). Discuss your alcohol use with your health care provider. Alcohol can affect any antidepressant medicines you are taking. Discuss any drug use with your health care provider. General instructions  Take over-the-counter and prescription medicines only as told by your health care provider. Eat a healthy diet and get plenty of sleep. Consider joining a support group. Your health care provider may be able to recommend one. Keep all follow-up visits as told by your health care provider. This is important. Where to find more information [employer] on Mental Illness: www.nami.[place]: https://carter.com/ Contact a health care provider if: Your symptoms get worse. You develop new symptoms. Get help right away if: You self-harm. You have serious thoughts about hurting yourself or others. You hallucinate. If you ever feel like you may hurt yourself or  others, or have thoughts about taking your own life, get help right away. Go to your nearest emergency department or: Call your local emergency services (911 in the [place] Call a suicide crisis helpline, such as the [place] at 385 864 5042 or 988 in the [place]. This is open 24 hours a day in the [place] Text the Crisis Text Line at 269-196-3437 (in the [place] Summary Major depressive disorder (MDD) is a mental health condition. MDD causes symptoms of sadness, hopelessness, and loss of interest in things. These symptoms last most of the day, almost every day, for 2 weeks. The symptoms of MDD can interfere with relationships and with everyday activities. Treatments and support are available for people who develop MDD. You may need more than one type of treatment. Get help right away if you have serious thoughts about hurting yourself or others. This information is not intended to replace advice given to you by your health care provider. Make sure you discuss any questions you have with your health care provider. Document Revised: 02/10/2021 Document Reviewed: 06/29/2019 Elsevier Patient Education  2022 [employer].

## 2021-09-01 ENCOUNTER — Telehealth: Payer: Self-pay | Admitting: *Deleted

## 2021-09-01 DIAGNOSIS — J84112 Idiopathic pulmonary fibrosis: Secondary | ICD-10-CM | POA: Diagnosis not present

## 2021-09-01 DIAGNOSIS — R051 Acute cough: Secondary | ICD-10-CM | POA: Diagnosis not present

## 2021-09-01 DIAGNOSIS — J439 Emphysema, unspecified: Secondary | ICD-10-CM | POA: Diagnosis not present

## 2021-09-01 LAB — CBC WITH DIFFERENTIAL/PLATELET
Basophils Absolute: 0.1 10*3/uL (ref 0.0–0.2)
Basos: 1 %
EOS (ABSOLUTE): 0.3 10*3/uL (ref 0.0–0.4)
Eos: 3 %
Hematocrit: 43.5 % (ref 37.5–51.0)
Hemoglobin: 14.2 g/dL (ref 13.0–17.7)
Immature Grans (Abs): 0.1 10*3/uL (ref 0.0–0.1)
Immature Granulocytes: 1 %
Lymphocytes Absolute: 2.3 10*3/uL (ref 0.7–3.1)
Lymphs: 26 %
MCH: 30.8 pg (ref 26.6–33.0)
MCHC: 32.6 g/dL (ref 31.5–35.7)
MCV: 94 fL (ref 79–97)
Monocytes Absolute: 0.7 10*3/uL (ref 0.1–0.9)
Monocytes: 8 %
Neutrophils Absolute: 5.4 10*3/uL (ref 1.4–7.0)
Neutrophils: 61 %
Platelets: 295 10*3/uL (ref 150–450)
RBC: 4.61 x10E6/uL (ref 4.14–5.80)
RDW: 13.2 % (ref 11.6–15.4)
WBC: 8.8 10*3/uL (ref 3.4–10.8)

## 2021-09-01 LAB — CMP14+EGFR
ALT: 20 IU/L (ref 0–44)
AST: 15 IU/L (ref 0–40)
Albumin/Globulin Ratio: 1.3 (ref 1.2–2.2)
Albumin: 4.3 g/dL (ref 3.8–4.9)
Alkaline Phosphatase: 79 IU/L (ref 44–121)
BUN/Creatinine Ratio: 8 — ABNORMAL LOW (ref 9–20)
BUN: 6 mg/dL (ref 6–24)
Bilirubin Total: 0.4 mg/dL (ref 0.0–1.2)
CO2: 24 mmol/L (ref 20–29)
Calcium: 9.3 mg/dL (ref 8.7–10.2)
Chloride: 97 mmol/L (ref 96–106)
Creatinine, Ser: 0.72 mg/dL — ABNORMAL LOW (ref 0.76–1.27)
Globulin, Total: 3.2 g/dL (ref 1.5–4.5)
Glucose: 112 mg/dL — ABNORMAL HIGH (ref 70–99)
Potassium: 4.3 mmol/L (ref 3.5–5.2)
Sodium: 137 mmol/L (ref 134–144)
Total Protein: 7.5 g/dL (ref 6.0–8.5)
eGFR: 106 mL/min/{1.73_m2} (ref >59)

## 2021-09-01 NOTE — Telephone Encounter (Signed)
At pt appt there was a question about pts results for recent cologaurd.  Nurse was unable to find results andthey never fell into Epic.   I called [employer] [facility] line for help on this today and found out that they never received the box/kit back from the pt, therefore - no results.   They verified insurance and address and they are shipping another kit to him. This one will have to be completed and shipped back to them by 11/03/21 or the order will expire.   I called pt to notify him of what I found out, but did not get him - I did Leave a very detailed message for him what we are doing and my name and number for any questions that may come up.

## 2021-09-02 ENCOUNTER — Ambulatory Visit: Payer: Medicaid Other | Admitting: Family

## 2021-09-21 ENCOUNTER — Telehealth: Payer: Self-pay | Admitting: Family

## 2021-09-21 NOTE — Telephone Encounter (Signed)
Patient aware to call oncologist

## 2021-09-27 DIAGNOSIS — Z79899 Other long term (current) drug therapy: Secondary | ICD-10-CM | POA: Diagnosis not present

## 2021-09-28 ENCOUNTER — Ambulatory Visit: Payer: Medicaid Other | Admitting: Family

## 2021-09-29 DIAGNOSIS — J439 Emphysema, unspecified: Secondary | ICD-10-CM | POA: Diagnosis not present

## 2021-09-29 DIAGNOSIS — J84112 Idiopathic pulmonary fibrosis: Secondary | ICD-10-CM | POA: Diagnosis not present

## 2021-09-29 DIAGNOSIS — R051 Acute cough: Secondary | ICD-10-CM | POA: Diagnosis not present

## 2021-10-25 DIAGNOSIS — Z79899 Other long term (current) drug therapy: Secondary | ICD-10-CM | POA: Diagnosis not present

## 2021-10-25 DIAGNOSIS — E559 Vitamin D deficiency, unspecified: Secondary | ICD-10-CM | POA: Diagnosis not present

## 2021-10-30 DIAGNOSIS — J439 Emphysema, unspecified: Secondary | ICD-10-CM | POA: Diagnosis not present

## 2021-10-30 DIAGNOSIS — R051 Acute cough: Secondary | ICD-10-CM | POA: Diagnosis not present

## 2021-10-30 DIAGNOSIS — J84112 Idiopathic pulmonary fibrosis: Secondary | ICD-10-CM | POA: Diagnosis not present

## 2021-11-25 DIAGNOSIS — Z79899 Other long term (current) drug therapy: Secondary | ICD-10-CM | POA: Diagnosis not present

## 2021-11-29 DIAGNOSIS — J84112 Idiopathic pulmonary fibrosis: Secondary | ICD-10-CM | POA: Diagnosis not present

## 2021-11-29 DIAGNOSIS — R051 Acute cough: Secondary | ICD-10-CM | POA: Diagnosis not present

## 2021-11-29 DIAGNOSIS — J439 Emphysema, unspecified: Secondary | ICD-10-CM | POA: Diagnosis not present

## 2021-12-23 DIAGNOSIS — Z79899 Other long term (current) drug therapy: Secondary | ICD-10-CM | POA: Diagnosis not present

## 2021-12-29 ENCOUNTER — Encounter (HOSPITAL_COMMUNITY): Payer: Self-pay

## 2021-12-29 ENCOUNTER — Other Ambulatory Visit: Payer: Self-pay

## 2021-12-29 ENCOUNTER — Emergency Department (HOSPITAL_COMMUNITY)
Admission: EM | Admit: 2021-12-29 | Discharge: 2021-12-29 | Discharge status: Against Medical Advice | Payer: Medicaid Other

## 2021-12-29 NOTE — ED Triage Notes (Signed)
Pt with tick bite x2 week ago after mowing. Pt states since he has not felt like himself. States that he has been running's fevers, chills, N/V.

## 2021-12-30 ENCOUNTER — Encounter: Payer: Self-pay | Admitting: Nurse Practitioner

## 2021-12-30 ENCOUNTER — Ambulatory Visit: Payer: Medicaid Other | Admitting: Nurse Practitioner

## 2021-12-30 VITALS — BP 105/65 | HR 83 | Temp 98.1°F | Resp 20 | Ht 72.0 in | Wt 142.0 lb

## 2021-12-30 DIAGNOSIS — S90562A Insect bite (nonvenomous), left ankle, initial encounter: Secondary | ICD-10-CM | POA: Diagnosis not present

## 2021-12-30 DIAGNOSIS — R5383 Other fatigue: Secondary | ICD-10-CM | POA: Diagnosis not present

## 2021-12-30 DIAGNOSIS — W57XXXA Bitten or stung by nonvenomous insect and other nonvenomous arthropods, initial encounter: Secondary | ICD-10-CM | POA: Diagnosis not present

## 2021-12-30 DIAGNOSIS — J84112 Idiopathic pulmonary fibrosis: Secondary | ICD-10-CM | POA: Diagnosis not present

## 2021-12-30 DIAGNOSIS — J439 Emphysema, unspecified: Secondary | ICD-10-CM | POA: Diagnosis not present

## 2021-12-30 DIAGNOSIS — E441 Mild protein-calorie malnutrition: Secondary | ICD-10-CM | POA: Diagnosis not present

## 2021-12-30 DIAGNOSIS — R051 Acute cough: Secondary | ICD-10-CM | POA: Diagnosis not present

## 2021-12-30 MED ORDER — DOXYCYCLINE HYCLATE 100 MG PO TABS: 100.0000 mg | ORAL_TABLET | Freq: Two times a day (BID) | ORAL | 0 refills | Status: DC

## 2021-12-30 MED ORDER — ENSURE MAX PROTEIN PO LIQD: 330.0000 mL | Freq: Every day | ORAL | 2 refills | Status: DC

## 2021-12-30 NOTE — Progress Notes (Signed)
   Subjective:    Patient ID: Bradley Rice, male    DOB: [birth year], 61 y.o.   MRN: [id number]  HPI Patient states he had a tick bite 2 weeks ago. He is now fatigued and nauseous.   Review of Systems  Constitutional:  Positive for fatigue and fever (100-102).  HENT: Negative.    Cardiovascular: Negative.   Gastrointestinal: Negative.   Neurological: Negative.   All other systems reviewed and are negative.     Objective:   Physical Exam Vitals reviewed.  Constitutional:      Appearance: He is well-developed.  HENT:     Right Ear: Tympanic membrane normal.     Left Ear: Tympanic membrane normal.     Nose: No congestion or rhinorrhea.  Cardiovascular:     Rate and Rhythm: Normal rate and regular rhythm.     Heart sounds: Normal heart sounds.  Pulmonary:     Effort: Pulmonary effort is normal.     Breath sounds: Normal breath sounds.  Skin:    Comments: Erythematous lesion on left lower leg  Neurological:     General: No focal deficit present.     Mental Status: He is alert and oriented to person, place, and time.  Psychiatric:        Mood and Affect: Mood normal.        Behavior: Behavior normal.   BP 105/65   Pulse 83   Temp 98.1 F (36.7 C) (Temporal)   Resp 20   Ht 6' (1.829 m)   Wt 142 lb (64.4 kg)   SpO2 93%   BMI 19.26 kg/m         Assessment & Plan:   Bradley Rice comes in today with chief complaint of Tick bite to lower left leg (2 weeks ago/), Nausea (/), Fever, and Shortness of Breath   Diagnosis and orders addressed:  1. Tick bite of left ankle, initial encounter - Lyme Disease Serology w/Reflex - [place] spotted fvr abs pnl(IgG+IgM)  2. Other fatigue Force fluids Rest RTO Prn Labs pending Meds ordered this encounter  Medications   doxycycline (VIBRA-TABS) 100 MG tablet    Sig: Take 1 tablet (100 mg total) by mouth 2 (two) times daily. 1 po bid    Dispense:  28 tablet    Refill:  0    Order Specific Question:   Supervising Provider     Answer:   Caryl Pina A [[id number]]      Follow up plan: prn   Mary-Margaret Hassell Done, FNP

## 2022-01-04 LAB — ROCKY MTN SPOTTED FVR ABS PNL(IGG+IGM)
RMSF IgG: POSITIVE — AB
RMSF IgM: 0.33 index (ref 0.00–0.89)

## 2022-01-04 LAB — RMSF, IGG, IFA: RMSF, IGG, IFA: <1:64 {titer}

## 2022-01-04 LAB — LYME DISEASE SEROLOGY W/REFLEX: Lyme Total Antibody EIA: NEGATIVE

## 2022-01-05 ENCOUNTER — Other Ambulatory Visit: Payer: Self-pay | Admitting: Emergency Medicine

## 2022-01-05 DIAGNOSIS — E441 Mild protein-calorie malnutrition: Secondary | ICD-10-CM

## 2022-01-05 MED ORDER — ENSURE MAX PROTEIN PO LIQD: 330.0000 mL | Freq: Every day | ORAL | 2 refills | Status: DC

## 2022-01-12 DIAGNOSIS — R634 Abnormal weight loss: Secondary | ICD-10-CM | POA: Diagnosis not present

## 2022-01-24 DIAGNOSIS — Z79899 Other long term (current) drug therapy: Secondary | ICD-10-CM | POA: Diagnosis not present

## 2022-01-27 DIAGNOSIS — Z79899 Other long term (current) drug therapy: Secondary | ICD-10-CM | POA: Diagnosis not present

## 2022-01-29 DIAGNOSIS — J84112 Idiopathic pulmonary fibrosis: Secondary | ICD-10-CM | POA: Diagnosis not present

## 2022-01-29 DIAGNOSIS — R051 Acute cough: Secondary | ICD-10-CM | POA: Diagnosis not present

## 2022-01-29 DIAGNOSIS — J439 Emphysema, unspecified: Secondary | ICD-10-CM | POA: Diagnosis not present

## 2022-02-10 ENCOUNTER — Ambulatory Visit (INDEPENDENT_AMBULATORY_CARE_PROVIDER_SITE_OTHER): Payer: Medicaid Other

## 2022-02-10 ENCOUNTER — Ambulatory Visit: Payer: Medicaid Other | Admitting: Family Medicine

## 2022-02-10 ENCOUNTER — Encounter: Payer: Self-pay | Admitting: Family Medicine

## 2022-02-10 VITALS — BP 114/68 | HR 80 | Temp 98.0°F | Ht 73.0 in | Wt 140.6 lb

## 2022-02-10 DIAGNOSIS — E441 Mild protein-calorie malnutrition: Secondary | ICD-10-CM

## 2022-02-10 DIAGNOSIS — R0602 Shortness of breath: Secondary | ICD-10-CM

## 2022-02-10 DIAGNOSIS — R7981 Abnormal blood-gas level: Secondary | ICD-10-CM

## 2022-02-10 DIAGNOSIS — J189 Pneumonia, unspecified organism: Secondary | ICD-10-CM

## 2022-02-10 MED ORDER — AZITHROMYCIN 250 MG PO TABS: ORAL_TABLET | ORAL | 0 refills | Status: DC

## 2022-02-10 MED ORDER — ENSURE MAX PROTEIN PO LIQD: 330.0000 mL | Freq: Three times a day (TID) | ORAL | 2 refills | Status: DC

## 2022-02-10 NOTE — Progress Notes (Signed)
Assessment & Plan:  1. Community acquired pneumonia of right lower lobe of lung - azithromycin (ZITHROMAX) 250 MG tablet; Take 2 tablets by mouth today, then 1 tablet daily on days 2 through 5.  Dispense: 6 tablet; Refill: 0  2. Low oxygen saturation - Anemia Profile B - DG Chest 2 View  3. Shortness of breath - DG Chest 2 View  4. Mild protein-calorie malnutrition (HCC) - Ensure Max Protein (ENSURE MAX PROTEIN) LIQD; Take 330 mLs by mouth 3 (three) times daily.  Dispense: 29700 mL; Refill: 2   Follow up plan: Return if symptoms worsen or fail to improve.  Hendricks Limes, MSN, APRN, FNP-C [facility]  Subjective:   Patient ID: Bradley Rice, male    DOB: Apr 25, 1961, 61 y.o.   MRN: [id number]  HPI: Bradley Rice is a 61 y.o. male presenting on 02/10/2022 for Fatigue and Chest Pain (Right sided rib pain that started on 02/01/22 on and off. Patient states that his HR will go up when he is in pain.)  Patient reports fatigue and right sided rib pain that started a little over a week ago. The pain was initially coming and going, but now it is constant. He is short winded. States the first time it occurred on July 4th he couldn't even walk back to the car. His wife tells him he has been disoriented and talking out of his head at times. He reports an EMT friend checked his oxygen saturation and it was 58% on July 4th, but he refused to go to the ER as recommended. He states his heart has been racing and his watch has shown him to be in the 170s. He is able to feel his heart racing but denies any irregularities. He has been wearing oxygen during the night, but not during the day. He has pulmonary fibrosis which is managed by a pulmonologist, Dr. Chase Caller. He reports he last saw him three days ago at which time he failed his 6-minute walk and was advised to use oxygen all the time (he is not currently wearing his oxygen, but states it is in the car).     ROS: Negative unless  specifically indicated above in HPI.   Relevant past medical history reviewed and updated as indicated.   Allergies and medications reviewed and updated.   Current Outpatient Medications:    albuterol (PROVENTIL) (2.5 MG/3ML) 0.083% nebulizer solution, Take 3 mLs (2.5 mg total) by nebulization every 6 (six) hours as needed for wheezing or shortness of breath., Disp: 150 mL, Rfl: 1   albuterol (VENTOLIN HFA) 108 (90 Base) MCG/ACT inhaler, Inhale 2 puffs into the lungs every 6 (six) hours as needed for wheezing or shortness of breath., Disp: 1 each, Rfl: 5   blood glucose meter kit and supplies, Dispense based on patient and insurance preference. Use up to four times daily as directed. (FOR ICD-10 E10.9, E11.9)., Disp: 1 each, Rfl: 0   busPIRone (BUSPAR) 5 MG tablet, Take 1 tablet (5 mg total) by mouth 3 (three) times daily., Disp: 90 tablet, Rfl: 1   diclofenac (VOLTAREN) 75 MG EC tablet, Take 1 tablet (75 mg total) by mouth 2 (two) times daily., Disp: 30 tablet, Rfl: 0   DULoxetine (CYMBALTA) 60 MG capsule, Take 1 capsule (60 mg total) by mouth daily., Disp: 90 capsule, Rfl: 1   Ensure Max Protein (ENSURE MAX PROTEIN) LIQD, Take 330 mLs by mouth daily., Disp: 29700 mL, Rfl: 2   fexofenadine (ALLEGRA ALLERGY)  180 MG tablet, Take 1 tablet (180 mg total) by mouth daily., Disp: 90 tablet, Rfl: 1   fluticasone (FLONASE) 50 MCG/ACT nasal spray, Place 2 sprays into both nostrils daily., Disp: 16 g, Rfl: 6   methocarbamol (ROBAXIN) 750 MG tablet, Take 1 tablet (750 mg total) by mouth 2 (two) times daily as needed. (Patient taking differently: Take 750 mg by mouth 3 (three) times daily as needed for muscle spasms.), Disp: 180 tablet, Rfl: 1   metoprolol tartrate (LOPRESSOR) 50 MG tablet, Take 1 tablet (50 mg total) by mouth 2 (two) times daily., Disp: 60 tablet, Rfl: 1   naloxone (NARCAN) nasal spray 4 mg/0.1 mL, Place 1 spray into the nose as needed (opioid overdose)., Disp: , Rfl:    Nintedanib (OFEV)  150 MG CAPS, Take 1 capsule (150 mg total) by mouth 2 (two) times daily., Disp: 180 capsule, Rfl: 3   oxyCODONE (ROXICODONE) 15 MG immediate release tablet, Take 15 mg by mouth 4 (four) times daily., Disp: , Rfl: 0   oxyCODONE-acetaminophen (PERCOCET) 10-325 MG tablet, Take 1 tablet by mouth 3 (three) times daily as needed., Disp: , Rfl:    tiotropium (SPIRIVA) 18 MCG inhalation capsule, Place 1 capsule (18 mcg total) into inhaler and inhale daily., Disp: 30 capsule, Rfl: 6   traZODone (DESYREL) 50 MG tablet, Take 1-2 tablets (50-100 mg total) by mouth at bedtime as needed for sleep., Disp: 60 tablet, Rfl: 3  Allergies  Allergen Reactions   Pirfenidone Nausea And Vomiting and Other (See Comments)    Weight loss    Objective:   BP 114/68   Pulse 80   Temp 98 F (36.7 C) (Temporal)   Ht '6\' 1"'  (1.854 m)   Wt 140 lb 9.6 oz (63.8 kg)   SpO2 98%   BMI 18.55 kg/m    Physical Exam Vitals reviewed.  Constitutional:      General: He is not in acute distress.    Appearance: Normal appearance. He is not ill-appearing, toxic-appearing or diaphoretic.  HENT:     Head: Normocephalic and atraumatic.  Eyes:     General: No scleral icterus.       Right eye: No discharge.        Left eye: No discharge.     Conjunctiva/sclera: Conjunctivae normal.  Cardiovascular:     Rate and Rhythm: Normal rate and regular rhythm.     Heart sounds: Normal heart sounds. No murmur heard.    No friction rub. No gallop.  Pulmonary:     Effort: Pulmonary effort is normal. No respiratory distress.     Breath sounds: No stridor. Decreased breath sounds and rales present. No wheezing or rhonchi.  Musculoskeletal:        General: Normal range of motion.     Cervical back: Normal range of motion.  Skin:    General: Skin is warm and dry.  Neurological:     Mental Status: He is alert and oriented to person, place, and time. Mental status is at baseline.  Psychiatric:        Mood and Affect: Mood normal.         Behavior: Behavior normal.        Thought Content: Thought content normal.        Judgment: Judgment normal.

## 2022-02-11 LAB — ANEMIA PROFILE B
Basophils Absolute: 0.2 10*3/uL (ref 0.0–0.2)
Basos: 2 %
EOS (ABSOLUTE): 0.2 10*3/uL (ref 0.0–0.4)
Eos: 2 %
Ferritin: 207 ng/mL (ref 30–400)
Folate: 11.5 ng/mL (ref >3.0)
Hematocrit: 42.9 % (ref 37.5–51.0)
Hemoglobin: 14.7 g/dL (ref 13.0–17.7)
Immature Grans (Abs): 0.1 10*3/uL (ref 0.0–0.1)
Immature Granulocytes: 1 %
Iron Saturation: 17 % (ref 15–55)
Iron: 41 ug/dL (ref 38–169)
Lymphocytes Absolute: 2 10*3/uL (ref 0.7–3.1)
Lymphs: 18 %
MCH: 31.9 pg (ref 26.6–33.0)
MCHC: 34.3 g/dL (ref 31.5–35.7)
MCV: 93 fL (ref 79–97)
Monocytes Absolute: 1 10*3/uL — ABNORMAL HIGH (ref 0.1–0.9)
Monocytes: 9 %
Neutrophils Absolute: 7.6 10*3/uL — ABNORMAL HIGH (ref 1.4–7.0)
Neutrophils: 68 %
Platelets: 357 10*3/uL (ref 150–450)
RBC: 4.61 x10E6/uL (ref 4.14–5.80)
RDW: 13.1 % (ref 11.6–15.4)
Retic Ct Pct: 0.9 % (ref 0.6–2.6)
Total Iron Binding Capacity: 248 ug/dL — ABNORMAL LOW (ref 250–450)
UIBC: 207 ug/dL (ref 111–343)
Vitamin B-12: 473 pg/mL (ref 232–1245)
WBC: 11 10*3/uL — ABNORMAL HIGH (ref 3.4–10.8)

## 2022-02-16 DIAGNOSIS — Z79899 Other long term (current) drug therapy: Secondary | ICD-10-CM | POA: Diagnosis not present

## 2022-02-17 DIAGNOSIS — R634 Abnormal weight loss: Secondary | ICD-10-CM | POA: Diagnosis not present

## 2022-02-27 ENCOUNTER — Emergency Department (HOSPITAL_COMMUNITY)
Admission: EM | Admit: 2022-02-27 | Discharge: 2022-02-27 | Disposition: A | Discharge status: Home / Self Care | Payer: Medicaid Other | Attending: Emergency Medicine | Admitting: Emergency Medicine

## 2022-02-27 ENCOUNTER — Other Ambulatory Visit: Payer: Self-pay

## 2022-02-27 ENCOUNTER — Encounter (HOSPITAL_COMMUNITY): Payer: Self-pay | Admitting: Emergency Medicine

## 2022-02-27 ENCOUNTER — Emergency Department (HOSPITAL_COMMUNITY): Payer: Medicaid Other

## 2022-02-27 DIAGNOSIS — S8001XA Contusion of right knee, initial encounter: Secondary | ICD-10-CM

## 2022-02-27 DIAGNOSIS — W132XXA Fall from, out of or through roof, initial encounter: Secondary | ICD-10-CM | POA: Diagnosis not present

## 2022-02-27 DIAGNOSIS — S161XXA Strain of muscle, fascia and tendon at neck level, initial encounter: Secondary | ICD-10-CM

## 2022-02-27 DIAGNOSIS — R519 Headache, unspecified: Secondary | ICD-10-CM | POA: Insufficient documentation

## 2022-02-27 DIAGNOSIS — M542 Cervicalgia: Secondary | ICD-10-CM | POA: Diagnosis not present

## 2022-02-27 DIAGNOSIS — M25561 Pain in right knee: Secondary | ICD-10-CM | POA: Diagnosis not present

## 2022-02-27 DIAGNOSIS — M545 Low back pain, unspecified: Secondary | ICD-10-CM

## 2022-02-27 DIAGNOSIS — S199XXA Unspecified injury of neck, initial encounter: Secondary | ICD-10-CM | POA: Diagnosis present

## 2022-02-27 DIAGNOSIS — M546 Pain in thoracic spine: Secondary | ICD-10-CM | POA: Diagnosis not present

## 2022-02-27 DIAGNOSIS — Y9389 Activity, other specified: Secondary | ICD-10-CM | POA: Insufficient documentation

## 2022-02-27 MED ORDER — MELOXICAM 7.5 MG PO TABS: 7.5000 mg | ORAL_TABLET | Freq: Two times a day (BID) | ORAL | 0 refills | Status: AC | PRN

## 2022-02-27 MED ORDER — METHOCARBAMOL 500 MG PO TABS: 500.0000 mg | ORAL_TABLET | Freq: Two times a day (BID) | ORAL | 0 refills | Status: DC | PRN

## 2022-02-27 MED ORDER — OXYCODONE-ACETAMINOPHEN 5-325 MG PO TABS
2.0000 | ORAL_TABLET | Freq: Once | ORAL | Status: AC
  Administered 2022-02-27: 2 via ORAL
  Filled 2022-02-27: qty 2

## 2022-02-27 NOTE — Discharge Instructions (Signed)
Thankfully your testing does not show any signs of broken bones, there is no dislocations.  It does show that you have some chronic changes related to the surgeries but nothing appears to be broken.  I do want you to take the following medications as needed for pain and muscle spasm  Please take Naprosyn, 500mg  by mouth twice daily as needed for pain - this in an antiinflammatory medicine (NSAID) and is similar to ibuprofen - many people feel that it is stronger than ibuprofen and it is easier to take since it is a smaller pill.  Please use this only for 1 week - if your pain persists, you will need to follow up with your doctor in the office for ongoing guidance and pain control.  Please take Robaxin, 500 mg up to 2 or 3 times a day as needed for muscle spasm, this is a muscle relaxer, it may cause generalized weakness, sleepiness and you should not drive or do important things while taking this medication.  This includes driving a vehicle or taking care of young children, these things should not be done while taking this medication.  Thank you for allowing [place] to treat you in the emergency department today.  After reviewing your examination and potential testing that was done it appears that you are safe to go home.  I would like for you to follow-up with your doctor within the next several days, have them obtain your results and follow-up with them to review all of these tests.  If you should develop severe or worsening symptoms return to the emergency department immediately

## 2022-02-27 NOTE — ED Provider Notes (Signed)
[facility] EMERGENCY DEPARTMENT Provider Note   CSN: [id number] Arrival date & time: 02/27/22  1213     History  Chief Complaint  Patient presents with   Bradley Rice is a 61 y.o. male.   Fall  This patient is a 61 year old male who unfortunately has a prior history of both cervical spine as well as lumbar spine surgeries in the past most recently cervical spine surgery within the last year.  He states that he was on the roof of his house last night trying to put a tarp over an area where there was some broken roof and shingles were missing and he slipped on the wet roof falling off of the roof, likely landing on his legs taking most of the injury to his right leg.  He denies any pain in his ankles, he does have pain in his right knee as well as his lower back his neck and his head.  He states that he has a severe headache which is worse than any pain he has ever had.  He denies hitting his head, denies landing on his back, he did collapse to the ground after hitting his legs but has been ambulatory with some difficulty since that time.  No numbness or weakness in the legs, no pain in the chest, no shortness of breath, no belly pain, no blurry vision.     Home Medications Prior to Admission medications   Medication Sig Start Date End Date Taking? Authorizing Provider  albuterol (PROVENTIL) (2.5 MG/3ML) 0.083% nebulizer solution Take 3 mLs (2.5 mg total) by nebulization every 6 (six) hours as needed for wheezing or shortness of breath. 11/03/20  Yes Hawks, Christy A, FNP  albuterol (VENTOLIN HFA) 108 (90 Base) MCG/ACT inhaler Inhale 2 puffs into the lungs every 6 (six) hours as needed for wheezing or shortness of breath. 11/03/20  Yes Hawks, Christy A, FNP  busPIRone (BUSPAR) 5 MG tablet Take 1 tablet (5 mg total) by mouth 3 (three) times daily. 08/31/21  Yes Hawks, Christy A, FNP  Cholecalciferol (VITAMIN D3) 1.25 MG (50000 UT) CAPS Take 1 capsule by mouth once a week. 01/24/22  Yes  [provider]  DULoxetine (CYMBALTA) 60 MG capsule Take 1 capsule (60 mg total) by mouth daily. 08/30/21  Yes Hawks, Theador Hawthorne, FNP  Ensure Max Protein (ENSURE MAX PROTEIN) LIQD Take 330 mLs by mouth 3 (three) times daily. 02/10/22  Yes Hendricks Limes F, FNP  meloxicam (MOBIC) 7.5 MG tablet Take 1 tablet (7.5 mg total) by mouth 2 (two) times daily as needed for up to 14 days for pain. 02/27/22 03/13/22 Yes Noemi Chapel, MD  methocarbamol (ROBAXIN) 500 MG tablet Take 1 tablet (500 mg total) by mouth 2 (two) times daily as needed for muscle spasms. 02/27/22  Yes Noemi Chapel, MD  metoprolol tartrate (LOPRESSOR) 50 MG tablet Take 1 tablet (50 mg total) by mouth 2 (two) times daily. 05/18/21  Yes Hawks, Christy A, FNP  naloxone [facility]) nasal spray 4 mg/0.1 mL Place 1 spray into the nose as needed (opioid overdose).   Yes [provider]  Nintedanib (OFEV) 150 MG CAPS Take 1 capsule (150 mg total) by mouth 2 (two) times daily. 10/02/20  Yes Brand Males, MD  oxyCODONE (ROXICODONE) 15 MG immediate release tablet Take 15 mg by mouth 4 (four) times daily. 06/22/18  Yes [provider]  oxyCODONE-acetaminophen (PERCOCET) 10-325 MG tablet Take 1 tablet by mouth 3 (three) times daily as needed.  08/10/21  Yes [provider]  tiotropium (SPIRIVA) 18 MCG inhalation capsule Place 1 capsule (18 mcg total) into inhaler and inhale daily. 11/03/20  Yes Hawks, Christy A, FNP  azithromycin (ZITHROMAX) 250 MG tablet Take 2 tablets by mouth today, then 1 tablet daily on days 2 through 5. Patient not taking: Reported on 02/27/2022 02/10/22   Loman Brooklyn, FNP  blood glucose meter kit and supplies Dispense based on patient and insurance preference. Use up to four times daily as directed. (FOR ICD-10 E10.9, E11.9). 05/18/21   Sharion Balloon, FNP      Allergies    Pirfenidone    Review of Systems   Review of Systems  All other systems reviewed and are negative.   Physical  Exam Updated Vital Signs BP (!) 132/95 (BP Location: Right Arm)   Pulse 94   Temp 98.2 F (36.8 C) (Oral)   Resp 17   Ht 1.829 m (6')   Wt 67.6 kg   SpO2 98%   BMI 20.21 kg/m  Physical Exam Vitals and nursing note reviewed.  Constitutional:      General: He is not in acute distress.    Appearance: He is well-developed.  HENT:     Head: Normocephalic and atraumatic.     Comments: No signs of trauma to the head    Mouth/Throat:     Pharynx: No oropharyngeal exudate.  Eyes:     General: No scleral icterus.       Right eye: No discharge.        Left eye: No discharge.     Conjunctiva/sclera: Conjunctivae normal.     Pupils: Pupils are equal, round, and reactive to light.  Neck:     Thyroid: No thyromegaly.     Vascular: No JVD.  Cardiovascular:     Rate and Rhythm: Normal rate and regular rhythm.     Heart sounds: Normal heart sounds. No murmur heard.    No friction rub. No gallop.  Pulmonary:     Effort: Pulmonary effort is normal. No respiratory distress.     Breath sounds: Normal breath sounds. No wheezing or rales.     Comments: No tenderness over the chest wall, no pain with deep breathing Abdominal:     General: Bowel sounds are normal. There is no distension.     Palpations: Abdomen is soft. There is no mass.     Tenderness: There is no abdominal tenderness.     Comments: No tenderness over the abdomen  Musculoskeletal:        General: Tenderness present.     Cervical back: Normal range of motion and neck supple.     Right lower leg: No edema.     Left lower leg: No edema.     Comments: Tenderness with range of motion of the right knee but everything appears anatomically normal.  He can straight leg raise without difficulty.  He has tenderness over the lumbar thoracic and cervical spines diffusely.  He is able to ambulate with mild antalgic gait  Lymphadenopathy:     Cervical: No cervical adenopathy.  Skin:    General: Skin is warm and dry.     Findings: No  erythema or rash.  Neurological:     Mental Status: He is alert.     Coordination: Coordination normal.  Psychiatric:        Behavior: Behavior normal.     ED Results / Procedures / Treatments   Labs (all labs ordered  are listed, but only abnormal results are displayed) Labs Reviewed - No data to display  EKG None  Radiology DG Lumbar Spine Complete  Result Date: 02/27/2022 CLINICAL DATA:  Fall from 12 foot roof. Low back pain. EXAM: LUMBAR SPINE - COMPLETE 4+ VIEW COMPARISON:  CT of the lumbar spine 04/21/2014 FINDINGS: Five non rib-bearing lumbar type vertebral bodies are present. Lumbar fusion at L5-S1 is stable. Vertebral body heights and disc spaces are maintained. Hardware is intact. No acute fractures are present. IMPRESSION: 1. No acute abnormality or significant interval change. 2. Stable L5-S1 fusion. Electronically Signed   By: San Morelle M.D.   On: 02/27/2022 13:56   DG Thoracic Spine 2 View  Result Date: 02/27/2022 CLINICAL DATA:  Acute mid back pain following fall. Initial encounter. EXAM: THORACIC SPINE 2 VIEWS COMPARISON:  02/10/2022 chest radiograph FINDINGS: No definite acute fracture or subluxation identified. No focal bony lesions are present. Chronic bilateral interstitial pulmonary opacities are again noted slightly limiting evaluation of the bones. Surgical fusion changes within the cervical spine again noted. IMPRESSION: 1. No evidence of acute abnormality. 2. Chronic bilateral interstitial pulmonary opacities. Electronically Signed   By: Margarette Canada M.D.   On: 02/27/2022 13:46   DG Knee Complete 4 Views Right  Result Date: 02/27/2022 CLINICAL DATA:  Acute RIGHT knee pain following fall yesterday. Initial encounter. EXAM: RIGHT KNEE - COMPLETE 4+ VIEW COMPARISON:  05/11/2013 radiograph FINDINGS: No evidence of fracture, dislocation, or joint effusion. No evidence of arthropathy or other focal bone abnormality. Soft tissues are unremarkable. IMPRESSION:  Negative. Electronically Signed   By: Margarette Canada M.D.   On: 02/27/2022 13:44   CT Head Wo Contrast  Result Date: 02/27/2022 CLINICAL DATA:  Patient fell off 12 foot roof last night. Patient complains of headache neck pain and lower back pain today. EXAM: CT HEAD WITHOUT CONTRAST CT CERVICAL SPINE WITHOUT CONTRAST TECHNIQUE: Multidetector CT imaging of the head and cervical spine was performed following the standard protocol without intravenous contrast. Multiplanar CT image reconstructions of the cervical spine were also generated. RADIATION DOSE REDUCTION: This exam was performed according to the departmental dose-optimization program which includes automated exposure control, adjustment of the mA and/or kV according to patient size and/or use of iterative reconstruction technique. COMPARISON:  Cervical spine radiographs 03/25/2021; CT cervical spine 01/15/2021; CT head 04/13/2017. FINDINGS: CT HEAD FINDINGS Brain: No evidence of acute infarction, hemorrhage, hydrocephalus, extra-axial collection or mass lesion/mass effect. Vascular: No hyperdense vessel or unexpected calcification. Skull: Normal. Negative for fracture or focal lesion. Sinuses/Orbits: No acute finding. Trace mucosal thickening in a few bilateral scattered ethmoid air cells as well as the left sphenoid sinus. Other: None. CT CERVICAL SPINE FINDINGS Alignment: Relative straightening of the normal cervical lordosis. Skull base and vertebrae: No acute fracture. No primary bone lesion or focal pathologic process. Soft tissues and spinal canal: No prevertebral fluid or swelling. No visible canal hematoma. Disc levels: Moderate osteoarthritis of the atlantodental joint. Mild to moderate degenerative disc disease at C2-C3 with vacuum disc phenomenon. Interval anterior fusion with intervertebral spacers at C3-C4 and C4-C5. Interbody devices at C5-C6 through C7-T1 with complete osseous fusion across the lower level disc spaces. Osseous fusion across the  bilateral facet joints at the lower cervical levels. In combination with uncovertebral spurring, there is mild-to-moderate narrowing of the left C4-C5 neural foramen. Upper chest: No acute finding. Paraseptal emphysema noted in the lung apices. Other: None. IMPRESSION: CT head: No acute intracranial abnormality. CT cervical spine: 1. No  fracture or traumatic malalignment. 2. Interval anterior fixation with intervertebral spacer at the C3-C4 and C4-C5 levels. Stable postoperative changes of prior fusion at the C5-C6 through C7-T1 levels. Electronically Signed   By: Ileana Roup M.D.   On: 02/27/2022 13:40   CT Cervical Spine Wo Contrast  Result Date: 02/27/2022 CLINICAL DATA:  Patient fell off 12 foot roof last night. Patient complains of headache neck pain and lower back pain today. EXAM: CT HEAD WITHOUT CONTRAST CT CERVICAL SPINE WITHOUT CONTRAST TECHNIQUE: Multidetector CT imaging of the head and cervical spine was performed following the standard protocol without intravenous contrast. Multiplanar CT image reconstructions of the cervical spine were also generated. RADIATION DOSE REDUCTION: This exam was performed according to the departmental dose-optimization program which includes automated exposure control, adjustment of the mA and/or kV according to patient size and/or use of iterative reconstruction technique. COMPARISON:  Cervical spine radiographs 03/25/2021; CT cervical spine 01/15/2021; CT head 04/13/2017. FINDINGS: CT HEAD FINDINGS Brain: No evidence of acute infarction, hemorrhage, hydrocephalus, extra-axial collection or mass lesion/mass effect. Vascular: No hyperdense vessel or unexpected calcification. Skull: Normal. Negative for fracture or focal lesion. Sinuses/Orbits: No acute finding. Trace mucosal thickening in a few bilateral scattered ethmoid air cells as well as the left sphenoid sinus. Other: None. CT CERVICAL SPINE FINDINGS Alignment: Relative straightening of the normal cervical  lordosis. Skull base and vertebrae: No acute fracture. No primary bone lesion or focal pathologic process. Soft tissues and spinal canal: No prevertebral fluid or swelling. No visible canal hematoma. Disc levels: Moderate osteoarthritis of the atlantodental joint. Mild to moderate degenerative disc disease at C2-C3 with vacuum disc phenomenon. Interval anterior fusion with intervertebral spacers at C3-C4 and C4-C5. Interbody devices at C5-C6 through C7-T1 with complete osseous fusion across the lower level disc spaces. Osseous fusion across the bilateral facet joints at the lower cervical levels. In combination with uncovertebral spurring, there is mild-to-moderate narrowing of the left C4-C5 neural foramen. Upper chest: No acute finding. Paraseptal emphysema noted in the lung apices. Other: None. IMPRESSION: CT head: No acute intracranial abnormality. CT cervical spine: 1. No fracture or traumatic malalignment. 2. Interval anterior fixation with intervertebral spacer at the C3-C4 and C4-C5 levels. Stable postoperative changes of prior fusion at the C5-C6 through C7-T1 levels. Electronically Signed   By: Ileana Roup M.D.   On: 02/27/2022 13:40    Procedures Procedures    Medications Ordered in ED Medications  oxyCODONE-acetaminophen (PERCOCET/ROXICET) 5-325 MG per tablet 2 tablet (2 tablets Oral Given 02/27/22 1324)    ED Course/ Medical Decision Making/ A&P                           Medical Decision Making Amount and/or Complexity of Data Reviewed Radiology: ordered.  Risk Prescription drug management.   This patient presents to the ED for concern of injury to her spine after a fall differential diagnosis includes possible fracture to the cervical thoracic or lumbar spines.  He has a significant headache, this could just be related to concussion or the acceleration deceleration injury, could be related to a secondary trauma, the patient's mental status is normal neurologic exam is normal and  exam is not consistent with severe trauma to the head    Additional history obtained:  Additional history obtained from patient External records from outside source obtained and reviewed including medical records reviewed including his prior evaluations in the emergency department and his family doctor, treated for pneumonia a  couple of weeks ago.  Surgery on his neck was August 2022.  Dr. Rolena Infante was his surgeon   Imaging Studies ordered:  I ordered imaging studies including CT scans of the head and the cervical spine as well as plain films of the thoracic and lumbar spine and the right knee I independently visualized and interpreted imaging which showed no acute findings including no fractures, lots of chronic change, hardware appears intact I agree with the radiologist interpretation   Medicines ordered and prescription drug management:  I ordered medication including Percocet for pain Reevaluation of the patient after these medicines showed that the patient improved I have reviewed the patients home medicines and have made adjustments as needed   Problem List / ED Course:  Pain medications given, patient asking for pain medicine for home Advised that he would be given anti-inflammatories and muscle relaxer Family doctor will need to follow-up with pain control if needed more than that.  I have discussed with the patient at the bedside the results, and the meaning of these results.  They have expressed her understanding to the need for follow-up with primary care physician   Social Determinants of Health:  Stable for discharge           Final Clinical Impression(s) / ED Diagnoses Final diagnoses:  Acute bilateral low back pain without sciatica  Acute strain of neck muscle, initial encounter  Contusion of right knee, initial encounter    Rx / DC Orders ED Discharge Orders          Ordered    meloxicam (MOBIC) 7.5 MG tablet  2 times daily PRN        02/27/22  1412    methocarbamol (ROBAXIN) 500 MG tablet  2 times daily PRN        02/27/22 1412              Noemi Chapel, MD 02/27/22 1413

## 2022-02-27 NOTE — ED Triage Notes (Signed)
Patient fell off roof approx 12 ft trying to apply tarp to roof. Per patient happened last night. Patient c/o headache, neck pain, and lower back pain. Denies hitting head but does state he did have LOC but not sure how long. Denies taking any type of anticoagulant. Denies any nausea or vomiting. Per patient dizziness with vision changes. Denies any complications with urination or BMs. CNS intact. Patient does report hx of back and neck surgery with medal rods.

## 2022-02-27 NOTE — ED Notes (Signed)
Patient transported to CT 

## 2022-02-28 ENCOUNTER — Encounter: Payer: Self-pay | Admitting: Family

## 2022-02-28 ENCOUNTER — Ambulatory Visit: Payer: Medicaid Other | Admitting: Family

## 2022-02-28 VITALS — BP 137/84 | HR 98 | Temp 97.6°F | Ht 72.0 in | Wt 142.4 lb

## 2022-02-28 DIAGNOSIS — M545 Low back pain, unspecified: Secondary | ICD-10-CM

## 2022-02-28 DIAGNOSIS — F331 Major depressive disorder, recurrent, moderate: Secondary | ICD-10-CM

## 2022-02-28 DIAGNOSIS — I10 Essential (primary) hypertension: Secondary | ICD-10-CM | POA: Diagnosis not present

## 2022-02-28 DIAGNOSIS — M4322 Fusion of spine, cervical region: Secondary | ICD-10-CM | POA: Diagnosis not present

## 2022-02-28 DIAGNOSIS — J84112 Idiopathic pulmonary fibrosis: Secondary | ICD-10-CM

## 2022-02-28 DIAGNOSIS — F411 Generalized anxiety disorder: Secondary | ICD-10-CM

## 2022-02-28 DIAGNOSIS — J439 Emphysema, unspecified: Secondary | ICD-10-CM

## 2022-02-28 DIAGNOSIS — Z87891 Personal history of nicotine dependence: Secondary | ICD-10-CM | POA: Diagnosis not present

## 2022-02-28 DIAGNOSIS — G8929 Other chronic pain: Secondary | ICD-10-CM | POA: Diagnosis not present

## 2022-02-28 DIAGNOSIS — F5101 Primary insomnia: Secondary | ICD-10-CM

## 2022-02-28 MED ORDER — OXYCODONE-ACETAMINOPHEN 10-325 MG PO TABS: 1.0000 | ORAL_TABLET | Freq: Three times a day (TID) | ORAL | 0 refills | Status: DC | PRN

## 2022-02-28 MED ORDER — METHOCARBAMOL 500 MG PO TABS: 500.0000 mg | ORAL_TABLET | Freq: Two times a day (BID) | ORAL | 0 refills | Status: DC | PRN

## 2022-02-28 MED ORDER — TRAZODONE HCL 50 MG PO TABS: 50.0000 mg | ORAL_TABLET | Freq: Every day | ORAL | 1 refills | Status: DC

## 2022-02-28 MED ORDER — FEXOFENADINE HCL 180 MG PO TABS
180.0000 mg | ORAL_TABLET | Freq: Every day | ORAL | 1 refills | Status: DC
Start: 2022-02-28 — End: 2023-02-02

## 2022-02-28 MED ORDER — DULOXETINE HCL 60 MG PO CPEP: 60.0000 mg | ORAL_CAPSULE | Freq: Every day | ORAL | 1 refills | Status: DC

## 2022-02-28 MED ORDER — DICLOFENAC SODIUM 75 MG PO TBEC: 75.0000 mg | DELAYED_RELEASE_TABLET | Freq: Two times a day (BID) | ORAL | 1 refills | Status: DC

## 2022-02-28 NOTE — Progress Notes (Signed)
Subjective:    Patient ID: Bradley Rice, male    DOB: [birth year], 61 y.o.   MRN: [id number] Chief Complaint  Patient presents with   Referral    PAIN MANAGEMENT HAS HIM DOING 2 DRUGS SCREENS A MONTH INSURANCE WILL ONLY PAY FOR ONCE A MONTH. WANTS PAIN MEDS REFILLED UNTIL CAN GET IN WITH A NEW ONE    Pt presents to the office today for chronic follow up.  He is followed by Pain clinic every  month for chronic pain, back pian, and neck pain. However, he is having to do two drug screens a month and his insurance will not cover this. He is having to pay $400+ for an extra visit.   He is followed by Neurosurgereon every 3 months for chronic neck pain and hx of cervical fusion.    He is followed by Pulmonologist for idiopathic pulmonary fibrosis every 3 months. Followed by Neurosurgery every month for neck pain.    He has Emphysema and quit smoking  apporx 2019. Has SOB but also has pulmonary fibrosis.  Hypertension This is a chronic problem. The current episode started more than 1 year ago. The problem has been resolved since onset. The problem is controlled. Associated symptoms include anxiety, malaise/fatigue, neck pain and shortness of breath. Pertinent negatives include no peripheral edema. Risk factors for coronary artery disease include dyslipidemia and male gender. The current treatment provides moderate improvement.  Neck Pain  This is a chronic problem. The current episode started more than 1 year ago. The problem occurs intermittently. The problem has been waxing and waning. The pain is at a severity of 10/10. The pain is moderate.  Anxiety Presents for follow-up visit. Symptoms include depressed mood, excessive worry, irritability, nervous/anxious behavior and shortness of breath. Symptoms occur occasionally. The severity of symptoms is moderate.    Depression        This is a chronic problem.  The current episode started more than 1 year ago.   The onset quality is gradual.   The  problem occurs intermittently.  Associated symptoms include decreased interest and sad.  Associated symptoms include no helplessness and no hopelessness.  Past treatments include SNRIs - Serotonin and norepinephrine reuptake inhibitors.  Past medical history includes anxiety.   Back Pain      Review of Systems  Constitutional:  Positive for irritability and malaise/fatigue.  Respiratory:  Positive for shortness of breath.   Musculoskeletal:  Positive for back pain and neck pain.  Psychiatric/Behavioral:  Positive for depression. The patient is nervous/anxious.   All other systems reviewed and are negative.      Objective:   Physical Exam Vitals reviewed.  Constitutional:      General: He is not in acute distress.    Appearance: He is well-developed.  HENT:     Head: Normocephalic.     Right Ear: Tympanic membrane normal.     Left Ear: Tympanic membrane normal.  Eyes:     General:        Right eye: No discharge.        Left eye: No discharge.     Pupils: Pupils are equal, round, and reactive to light.  Neck:     Thyroid: No thyromegaly.  Cardiovascular:     Rate and Rhythm: Normal rate and regular rhythm.     Heart sounds: Normal heart sounds. No murmur heard. Pulmonary:     Effort: Pulmonary effort is normal. No respiratory distress.     Breath  sounds: Normal breath sounds. No wheezing.  Abdominal:     General: Bowel sounds are normal. There is no distension.     Palpations: Abdomen is soft.     Tenderness: There is no abdominal tenderness.  Musculoskeletal:        General: Tenderness present.     Cervical back: Normal range of motion and neck supple.     Comments: Pain in neck and back with flexion extension  Skin:    General: Skin is warm and dry.     Findings: No erythema or rash.  Neurological:     Mental Status: He is alert and oriented to person, place, and time.     Cranial Nerves: No cranial nerve deficit.     Deep Tendon Reflexes: Reflexes are normal and  symmetric.  Psychiatric:        Behavior: Behavior normal.        Thought Content: Thought content normal.        Judgment: Judgment normal.       BP 137/84   Pulse 98   Temp 97.6 F (36.4 C) (Temporal)   Ht 6' (1.829 m)   Wt 142 lb 6.4 oz (64.6 kg)   SpO2 99%   BMI 19.31 kg/m      Assessment & Plan:  Bradley Rice comes in today with chief complaint of Referral (PAIN MANAGEMENT HAS HIM DOING 2 DRUGS SCREENS A MONTH INSURANCE WILL ONLY PAY FOR ONCE A MONTH. WANTS PAIN MEDS REFILLED UNTIL CAN GET IN WITH A NEW ONE )   Diagnosis and orders addressed:  1. Primary hypertension  2. IPF (idiopathic pulmonary fibrosis) ([place])  3. Pulmonary emphysema, unspecified emphysema type ([place])  4. Fusion of spine, cervical region - Ambulatory referral to Pain Clinic - methocarbamol (ROBAXIN) 500 MG tablet; Take 1 tablet (500 mg total) by mouth 2 (two) times daily as needed for muscle spasms.  Dispense: 20 tablet; Refill: 0 - oxyCODONE-acetaminophen (PERCOCET) 10-325 MG tablet; Take 1 tablet by mouth 3 (three) times daily as needed.  Dispense: 90 tablet; Refill: 0 - diclofenac (VOLTAREN) 75 MG EC tablet; Take 1 tablet (75 mg total) by mouth 2 (two) times daily.  Dispense: 180 tablet; Refill: 1  5. GAD (generalized anxiety disorder) - DULoxetine (CYMBALTA) 60 MG capsule; Take 1 capsule (60 mg total) by mouth daily.  Dispense: 90 capsule; Refill: 1  6. Moderate episode of recurrent major depressive disorder (HCC) - DULoxetine (CYMBALTA) 60 MG capsule; Take 1 capsule (60 mg total) by mouth daily.  Dispense: 90 capsule; Refill: 1  7. Chronic bilateral low back pain, unspecified whether sciatica present - Ambulatory referral to Pain Clinic - methocarbamol (ROBAXIN) 500 MG tablet; Take 1 tablet (500 mg total) by mouth 2 (two) times daily as needed for muscle spasms.  Dispense: 20 tablet; Refill: 0 - oxyCODONE-acetaminophen (PERCOCET) 10-325 MG tablet; Take 1 tablet by mouth 3 (three) times daily  as needed.  Dispense: 90 tablet; Refill: 0 - diclofenac (VOLTAREN) 75 MG EC tablet; Take 1 tablet (75 mg total) by mouth 2 (two) times daily.  Dispense: 180 tablet; Refill: 1  8. Hx of smoking  9. Other chronic pain - Ambulatory referral to Pain Clinic - oxyCODONE-acetaminophen (PERCOCET) 10-325 MG tablet; Take 1 tablet by mouth 3 (three) times daily as needed.  Dispense: 90 tablet; Refill: 0 - diclofenac (VOLTAREN) 75 MG EC tablet; Take 1 tablet (75 mg total) by mouth 2 (two) times daily.  Dispense: 180 tablet; Refill: 1  10. Primary insomnia - traZODone (DESYREL) 50 MG tablet; Take 1 tablet (50 mg total) by mouth at bedtime.  Dispense: 90 tablet; Refill: 1   Will given one month of pain medication while waiting for referral to pain clinic.  PT reviewed in  controlled database  Health Maintenance reviewed Diet and exercise encouraged  Follow up plan: 3 months    Evelina Dun, FNP

## 2022-02-28 NOTE — Patient Instructions (Signed)

## 2022-03-01 DIAGNOSIS — R051 Acute cough: Secondary | ICD-10-CM | POA: Diagnosis not present

## 2022-03-01 DIAGNOSIS — J439 Emphysema, unspecified: Secondary | ICD-10-CM | POA: Diagnosis not present

## 2022-03-01 DIAGNOSIS — J84112 Idiopathic pulmonary fibrosis: Secondary | ICD-10-CM | POA: Diagnosis not present

## 2022-03-22 DIAGNOSIS — M5459 Other low back pain: Secondary | ICD-10-CM | POA: Diagnosis not present

## 2022-03-22 DIAGNOSIS — M5451 Vertebrogenic low back pain: Secondary | ICD-10-CM | POA: Diagnosis not present

## 2022-03-29 ENCOUNTER — Telehealth: Payer: Self-pay | Admitting: Family

## 2022-03-29 DIAGNOSIS — R634 Abnormal weight loss: Secondary | ICD-10-CM | POA: Diagnosis not present

## 2022-03-29 NOTE — Telephone Encounter (Signed)
Sorry can not refill.

## 2022-03-29 NOTE — Telephone Encounter (Signed)
Made patient aware. Pt voiced understanding.

## 2022-03-30 DIAGNOSIS — M5451 Vertebrogenic low back pain: Secondary | ICD-10-CM | POA: Diagnosis not present

## 2022-04-01 DIAGNOSIS — J84112 Idiopathic pulmonary fibrosis: Secondary | ICD-10-CM | POA: Diagnosis not present

## 2022-04-01 DIAGNOSIS — J439 Emphysema, unspecified: Secondary | ICD-10-CM | POA: Diagnosis not present

## 2022-04-12 DIAGNOSIS — M259 Joint disorder, unspecified: Secondary | ICD-10-CM | POA: Diagnosis not present

## 2022-04-12 DIAGNOSIS — M5451 Vertebrogenic low back pain: Secondary | ICD-10-CM | POA: Diagnosis not present

## 2022-04-13 ENCOUNTER — Other Ambulatory Visit: Payer: Self-pay | Admitting: Orthopedic Surgery

## 2022-04-13 DIAGNOSIS — M259 Joint disorder, unspecified: Secondary | ICD-10-CM

## 2022-04-14 DIAGNOSIS — G894 Chronic pain syndrome: Secondary | ICD-10-CM | POA: Diagnosis not present

## 2022-04-14 DIAGNOSIS — J984 Other disorders of lung: Secondary | ICD-10-CM | POA: Diagnosis not present

## 2022-04-14 DIAGNOSIS — I10 Essential (primary) hypertension: Secondary | ICD-10-CM | POA: Diagnosis not present

## 2022-04-14 DIAGNOSIS — J439 Emphysema, unspecified: Secondary | ICD-10-CM | POA: Diagnosis not present

## 2022-04-14 DIAGNOSIS — Z79891 Long term (current) use of opiate analgesic: Secondary | ICD-10-CM | POA: Diagnosis not present

## 2022-04-14 DIAGNOSIS — M961 Postlaminectomy syndrome, not elsewhere classified: Secondary | ICD-10-CM | POA: Diagnosis not present

## 2022-04-14 DIAGNOSIS — G8929 Other chronic pain: Secondary | ICD-10-CM | POA: Diagnosis not present

## 2022-04-14 DIAGNOSIS — R52 Pain, unspecified: Secondary | ICD-10-CM | POA: Diagnosis not present

## 2022-04-14 DIAGNOSIS — M5431 Sciatica, right side: Secondary | ICD-10-CM | POA: Diagnosis not present

## 2022-04-14 DIAGNOSIS — M479 Spondylosis, unspecified: Secondary | ICD-10-CM | POA: Diagnosis not present

## 2022-04-14 DIAGNOSIS — B999 Unspecified infectious disease: Secondary | ICD-10-CM | POA: Diagnosis not present

## 2022-04-21 DIAGNOSIS — M961 Postlaminectomy syndrome, not elsewhere classified: Secondary | ICD-10-CM | POA: Diagnosis not present

## 2022-04-21 DIAGNOSIS — M545 Low back pain, unspecified: Secondary | ICD-10-CM | POA: Diagnosis not present

## 2022-04-21 DIAGNOSIS — M5459 Other low back pain: Secondary | ICD-10-CM | POA: Diagnosis not present

## 2022-04-21 DIAGNOSIS — M533 Sacrococcygeal disorders, not elsewhere classified: Secondary | ICD-10-CM | POA: Diagnosis not present

## 2022-05-01 DIAGNOSIS — J84112 Idiopathic pulmonary fibrosis: Secondary | ICD-10-CM | POA: Diagnosis not present

## 2022-05-01 DIAGNOSIS — J439 Emphysema, unspecified: Secondary | ICD-10-CM | POA: Diagnosis not present

## 2022-05-10 DIAGNOSIS — M533 Sacrococcygeal disorders, not elsewhere classified: Secondary | ICD-10-CM | POA: Diagnosis not present

## 2022-05-10 DIAGNOSIS — M545 Low back pain, unspecified: Secondary | ICD-10-CM | POA: Diagnosis not present

## 2022-05-12 DIAGNOSIS — Z79891 Long term (current) use of opiate analgesic: Secondary | ICD-10-CM | POA: Diagnosis not present

## 2022-05-12 DIAGNOSIS — G894 Chronic pain syndrome: Secondary | ICD-10-CM | POA: Diagnosis not present

## 2022-06-01 DIAGNOSIS — J439 Emphysema, unspecified: Secondary | ICD-10-CM | POA: Diagnosis not present

## 2022-06-01 DIAGNOSIS — J84112 Idiopathic pulmonary fibrosis: Secondary | ICD-10-CM | POA: Diagnosis not present

## 2022-06-01 DIAGNOSIS — R051 Acute cough: Secondary | ICD-10-CM | POA: Diagnosis not present

## 2022-06-03 ENCOUNTER — Telehealth: Payer: Self-pay | Admitting: *Deleted

## 2022-06-03 NOTE — Telephone Encounter (Signed)
Tried to do PA on CoverMyMeds for Fexofenadine HCl 180MG  tablets but received message stating-available without authorization.

## 2022-06-06 DIAGNOSIS — M545 Low back pain, unspecified: Secondary | ICD-10-CM | POA: Diagnosis not present

## 2022-06-06 DIAGNOSIS — M533 Sacrococcygeal disorders, not elsewhere classified: Secondary | ICD-10-CM | POA: Diagnosis not present

## 2022-06-09 DIAGNOSIS — G8929 Other chronic pain: Secondary | ICD-10-CM | POA: Diagnosis not present

## 2022-06-09 DIAGNOSIS — M549 Dorsalgia, unspecified: Secondary | ICD-10-CM | POA: Diagnosis not present

## 2022-06-09 DIAGNOSIS — Z79891 Long term (current) use of opiate analgesic: Secondary | ICD-10-CM | POA: Diagnosis not present

## 2022-06-09 DIAGNOSIS — J439 Emphysema, unspecified: Secondary | ICD-10-CM | POA: Diagnosis not present

## 2022-06-09 DIAGNOSIS — B999 Unspecified infectious disease: Secondary | ICD-10-CM | POA: Diagnosis not present

## 2022-06-09 DIAGNOSIS — G894 Chronic pain syndrome: Secondary | ICD-10-CM | POA: Diagnosis not present

## 2022-06-09 DIAGNOSIS — I10 Essential (primary) hypertension: Secondary | ICD-10-CM | POA: Diagnosis not present

## 2022-06-09 DIAGNOSIS — M961 Postlaminectomy syndrome, not elsewhere classified: Secondary | ICD-10-CM | POA: Diagnosis not present

## 2022-06-09 DIAGNOSIS — M479 Spondylosis, unspecified: Secondary | ICD-10-CM | POA: Diagnosis not present

## 2022-06-09 DIAGNOSIS — M5431 Sciatica, right side: Secondary | ICD-10-CM | POA: Diagnosis not present

## 2022-06-09 DIAGNOSIS — F329 Major depressive disorder, single episode, unspecified: Secondary | ICD-10-CM | POA: Diagnosis not present

## 2022-06-09 DIAGNOSIS — J984 Other disorders of lung: Secondary | ICD-10-CM | POA: Diagnosis not present

## 2022-06-13 ENCOUNTER — Emergency Department (HOSPITAL_COMMUNITY)
Admission: EM | Admit: 2022-06-13 | Discharge: 2022-06-14 | Disposition: A | Discharge status: Home / Self Care | Payer: Medicaid Other | Attending: Emergency Medicine | Admitting: Emergency Medicine

## 2022-06-13 ENCOUNTER — Other Ambulatory Visit: Payer: Self-pay

## 2022-06-13 ENCOUNTER — Emergency Department (HOSPITAL_COMMUNITY): Payer: Medicaid Other

## 2022-06-13 ENCOUNTER — Telehealth: Payer: Self-pay | Admitting: Cardiology

## 2022-06-13 ENCOUNTER — Encounter (HOSPITAL_COMMUNITY): Payer: Self-pay | Admitting: *Deleted

## 2022-06-13 DIAGNOSIS — R059 Cough, unspecified: Secondary | ICD-10-CM | POA: Diagnosis not present

## 2022-06-13 DIAGNOSIS — D72829 Elevated white blood cell count, unspecified: Secondary | ICD-10-CM | POA: Diagnosis not present

## 2022-06-13 DIAGNOSIS — R110 Nausea: Secondary | ICD-10-CM | POA: Insufficient documentation

## 2022-06-13 DIAGNOSIS — R918 Other nonspecific abnormal finding of lung field: Secondary | ICD-10-CM | POA: Diagnosis not present

## 2022-06-13 DIAGNOSIS — J849 Interstitial pulmonary disease, unspecified: Secondary | ICD-10-CM | POA: Diagnosis not present

## 2022-06-13 DIAGNOSIS — Z79899 Other long term (current) drug therapy: Secondary | ICD-10-CM | POA: Insufficient documentation

## 2022-06-13 DIAGNOSIS — R072 Precordial pain: Secondary | ICD-10-CM | POA: Diagnosis not present

## 2022-06-13 DIAGNOSIS — R058 Other specified cough: Secondary | ICD-10-CM

## 2022-06-13 DIAGNOSIS — R0789 Other chest pain: Secondary | ICD-10-CM | POA: Diagnosis not present

## 2022-06-13 DIAGNOSIS — Z20822 Contact with and (suspected) exposure to covid-19: Secondary | ICD-10-CM | POA: Insufficient documentation

## 2022-06-13 DIAGNOSIS — R000 Tachycardia, unspecified: Secondary | ICD-10-CM | POA: Insufficient documentation

## 2022-06-13 DIAGNOSIS — J189 Pneumonia, unspecified organism: Secondary | ICD-10-CM

## 2022-06-13 DIAGNOSIS — R079 Chest pain, unspecified: Secondary | ICD-10-CM | POA: Diagnosis not present

## 2022-06-13 DIAGNOSIS — R0602 Shortness of breath: Secondary | ICD-10-CM | POA: Diagnosis not present

## 2022-06-13 DIAGNOSIS — I700 Atherosclerosis of aorta: Secondary | ICD-10-CM | POA: Diagnosis not present

## 2022-06-13 DIAGNOSIS — R112 Nausea with vomiting, unspecified: Secondary | ICD-10-CM | POA: Diagnosis not present

## 2022-06-13 LAB — BASIC METABOLIC PANEL
Anion gap: 10 (ref 5–15)
BUN: 11 mg/dL (ref 6–20)
CO2: 24 mmol/L (ref 22–32)
Calcium: 9.1 mg/dL (ref 8.9–10.3)
Chloride: 103 mmol/L (ref 98–111)
Creatinine, Ser: 0.97 mg/dL (ref 0.61–1.24)
GFR, Estimated: >60 mL/min (ref >60)
Glucose, Bld: 147 mg/dL — ABNORMAL HIGH (ref 70–99)
Potassium: 3.6 mmol/L (ref 3.5–5.1)
Sodium: 137 mmol/L (ref 135–145)

## 2022-06-13 LAB — CBC
HCT: 42.5 % (ref 39.0–52.0)
Hemoglobin: 14.1 g/dL (ref 13.0–17.0)
MCH: 32.3 pg (ref 26.0–34.0)
MCHC: 33.2 g/dL (ref 30.0–36.0)
MCV: 97.5 fL (ref 80.0–100.0)
Platelets: 294 10*3/uL (ref 150–400)
RBC: 4.36 MIL/uL (ref 4.22–5.81)
RDW: 13.6 % (ref 11.5–15.5)
WBC: 19.7 10*3/uL — ABNORMAL HIGH (ref 4.0–10.5)
nRBC: 0 % (ref 0.0–0.2)

## 2022-06-13 LAB — RESP PANEL BY RT-PCR (FLU A&B, COVID) ARPGX2
Influenza A by PCR: NEGATIVE
Influenza B by PCR: NEGATIVE
SARS Coronavirus 2 by RT PCR: NEGATIVE

## 2022-06-13 LAB — TROPONIN I (HIGH SENSITIVITY)
Troponin I (High Sensitivity): <2 ng/L (ref <18)
Troponin I (High Sensitivity): 2 ng/L (ref <18)

## 2022-06-13 LAB — D-DIMER, QUANTITATIVE: D-Dimer, Quant: <0.27 ug/mL-FEU (ref 0.00–0.50)

## 2022-06-13 MED ORDER — ASPIRIN 81 MG PO CHEW
324.0000 mg | CHEWABLE_TABLET | Freq: Once | ORAL | Status: AC
  Administered 2022-06-13: 324 mg via ORAL
  Filled 2022-06-13: qty 4

## 2022-06-13 MED ORDER — NITROGLYCERIN 0.4 MG SL SUBL
0.4000 mg | SUBLINGUAL_TABLET | Freq: Once | SUBLINGUAL | Status: AC
  Administered 2022-06-13: 0.4 mg via SUBLINGUAL
  Filled 2022-06-13: qty 1

## 2022-06-13 MED ORDER — IOHEXOL 350 MG/ML SOLN
100.0000 mL | Freq: Once | INTRAVENOUS | Status: AC | PRN
  Administered 2022-06-13: 100 mL via INTRAVENOUS

## 2022-06-13 MED ORDER — AZITHROMYCIN 250 MG PO TABS: 250.0000 mg | ORAL_TABLET | Freq: Every day | ORAL | 0 refills | Status: DC

## 2022-06-13 MED ORDER — SODIUM CHLORIDE 0.9 % IV SOLN
1.0000 g | Freq: Once | INTRAVENOUS | Status: AC
  Administered 2022-06-13: 1 g via INTRAVENOUS
  Filled 2022-06-13: qty 10

## 2022-06-13 MED ORDER — AZITHROMYCIN 250 MG PO TABS
500.0000 mg | ORAL_TABLET | Freq: Once | ORAL | Status: AC
Start: 2022-06-13 — End: 2022-06-13
  Administered 2022-06-13: 500 mg via ORAL
  Filled 2022-06-13: qty 2

## 2022-06-13 MED ORDER — SODIUM CHLORIDE 0.9 % IV BOLUS
500.0000 mL | Freq: Once | INTRAVENOUS | Status: AC
  Administered 2022-06-13: 500 mL via INTRAVENOUS

## 2022-06-13 NOTE — ED Provider Notes (Signed)
[facility] EMERGENCY DEPARTMENT Provider Note   CSN: [id number] Arrival date & time: 06/13/22  2024     History  Chief Complaint  Patient presents with   Chest Pain    Bradley Rice is a 61 y.o. male.  Pt with hx cad, c/o mid chest pain onset last night, at rest. Dull pain, midline, mid to upper chest, at rest, not pleuritic, initially radiating towards left shoulder. Mild sob and nausea. No diaphoresis. No recent exertional chest pain. Symptoms have persisted since onset, never resolving, and then worse again at 5 pm today. Unsure if like prior cardiac chest pain. +recent non prod cough starting in past day. No sore throat or runny nose. No known ill contacts. No fever or chills. Denies abd pain. No leg pain or swelling.   The history is provided by the patient and medical records.  Chest Pain Associated symptoms: cough, nausea and shortness of breath   Associated symptoms: no abdominal pain, no back pain, no fever, no headache, no palpitations and no vomiting        Home Medications Prior to Admission medications   Medication Sig Start Date End Date Taking? Authorizing Provider  albuterol (PROVENTIL) (2.5 MG/3ML) 0.083% nebulizer solution Take 3 mLs (2.5 mg total) by nebulization every 6 (six) hours as needed for wheezing or shortness of breath. 11/03/20   Sharion Balloon, FNP  albuterol (VENTOLIN HFA) 108 (90 Base) MCG/ACT inhaler Inhale 2 puffs into the lungs every 6 (six) hours as needed for wheezing or shortness of breath. 11/03/20   Sharion Balloon, FNP  blood glucose meter kit and supplies Dispense based on patient and insurance preference. Use up to four times daily as directed. (FOR ICD-10 E10.9, E11.9). 05/18/21   Sharion Balloon, FNP  busPIRone (BUSPAR) 5 MG tablet Take 1 tablet (5 mg total) by mouth 3 (three) times daily. 08/31/21   Evelina Dun A, FNP  Cholecalciferol (VITAMIN D3) 1.25 MG (50000 UT) CAPS Take 1 capsule by mouth once a week. 01/24/22   [provider]  diclofenac (VOLTAREN) 75 MG EC tablet Take 1 tablet (75 mg total) by mouth 2 (two) times daily. 02/28/22   Sharion Balloon, FNP  DULoxetine (CYMBALTA) 60 MG capsule Take 1 capsule (60 mg total) by mouth daily. 02/28/22   Sharion Balloon, FNP  Ensure Max Protein (ENSURE MAX PROTEIN) LIQD Take 330 mLs by mouth 3 (three) times daily. 02/10/22   Loman Brooklyn, FNP  fexofenadine (ALLEGRA) 180 MG tablet Take 1 tablet (180 mg total) by mouth daily. 02/28/22   Sharion Balloon, FNP  methocarbamol (ROBAXIN) 500 MG tablet Take 1 tablet (500 mg total) by mouth 2 (two) times daily as needed for muscle spasms. 02/28/22   Sharion Balloon, FNP  metoprolol tartrate (LOPRESSOR) 50 MG tablet Take 1 tablet (50 mg total) by mouth 2 (two) times daily. 05/18/21   Sharion Balloon, FNP  naloxone [facility]) nasal spray 4 mg/0.1 mL Place 1 spray into the nose as needed (opioid overdose).    [provider]  Nintedanib (OFEV) 150 MG CAPS Take 1 capsule (150 mg total) by mouth 2 (two) times daily. 10/02/20   Brand Males, MD  oxyCODONE (ROXICODONE) 15 MG immediate release tablet Take 15 mg by mouth 4 (four) times daily. 06/22/18   [provider]  oxyCODONE-acetaminophen (PERCOCET) 10-325 MG tablet Take 1 tablet by mouth 3 (three) times daily as needed. 02/28/22   Sharion Balloon, FNP  tiotropium (SPIRIVA) 18 MCG inhalation capsule Place 1 capsule (18 mcg total) into inhaler and inhale daily. 11/03/20   Sharion Balloon, FNP  traZODone (DESYREL) 50 MG tablet Take 1 tablet (50 mg total) by mouth at bedtime. 02/28/22   Sharion Balloon, FNP      Allergies    Pirfenidone    Review of Systems   Review of Systems  Constitutional:  Negative for chills and fever.  HENT:  Negative for sore throat.   Eyes:  Negative for redness.  Respiratory:  Positive for cough and shortness of breath.   Cardiovascular:  Positive for chest pain. Negative for palpitations and leg swelling.  Gastrointestinal:   Positive for nausea. Negative for abdominal pain, diarrhea and vomiting.  Genitourinary:  Negative for dysuria and flank pain.  Musculoskeletal:  Negative for back pain and neck pain.  Skin:  Negative for rash.  Neurological:  Negative for headaches.  Hematological:  Does not bruise/bleed easily.  Psychiatric/Behavioral:  Negative for confusion.     Physical Exam Updated Vital Signs BP 108/61   Pulse (!) 108   Temp 98.7 F (37.1 C) (Oral)   Resp (!) 23   Ht 1.829 m (6')   Wt 68.3 kg   SpO2 93%   BMI 20.43 kg/m  Physical Exam Vitals and nursing note reviewed.  Constitutional:      Appearance: Normal appearance. He is well-developed.  HENT:     Head: Atraumatic.     Nose: Nose normal.     Mouth/Throat:     Mouth: Mucous membranes are moist.  Eyes:     General: No scleral icterus.    Conjunctiva/sclera: Conjunctivae normal.  Neck:     Trachea: No tracheal deviation.  Cardiovascular:     Rate and Rhythm: Regular rhythm. Tachycardia present.     Pulses: Normal pulses.     Heart sounds: Normal heart sounds. No murmur heard.    No friction rub. No gallop.  Pulmonary:     Effort: Pulmonary effort is normal. No accessory muscle usage or respiratory distress.     Breath sounds: Normal breath sounds.  Chest:     Chest wall: No tenderness.  Abdominal:     General: Bowel sounds are normal. There is no distension.     Palpations: Abdomen is soft.     Tenderness: There is no abdominal tenderness.  Genitourinary:    Comments: No cva tenderness. Musculoskeletal:        General: No swelling or tenderness.     Cervical back: Normal range of motion and neck supple. No rigidity.     Right lower leg: No edema.     Left lower leg: No edema.  Skin:    General: Skin is warm and dry.     Findings: No rash.  Neurological:     Mental Status: He is alert.     Comments: Alert, speech clear.   Psychiatric:        Mood and Affect: Mood normal.     ED Results / Procedures /  Treatments   Labs (all labs ordered are listed, but only abnormal results are displayed) Results for orders placed or performed during the hospital encounter of 24/40/10  Basic metabolic panel  Result Value Ref Range   Sodium 137 135 - 145 mmol/L   Potassium 3.6 3.5 - 5.1 mmol/L   Chloride 103 98 - 111 mmol/L   CO2 24 22 - 32 mmol/L   Glucose, Bld 147 (H) 70 -  99 mg/dL   BUN 11 6 - 20 mg/dL   Creatinine, Ser 0.97 0.61 - 1.24 mg/dL   Calcium 9.1 8.9 - 10.3 mg/dL   GFR, Estimated >60 >60 mL/min   Anion gap 10 5 - 15  CBC  Result Value Ref Range   WBC 19.7 (H) 4.0 - 10.5 K/uL   RBC 4.36 4.22 - 5.81 MIL/uL   Hemoglobin 14.1 13.0 - 17.0 g/dL   HCT 42.5 39.0 - 52.0 %   MCV 97.5 80.0 - 100.0 fL   MCH 32.3 26.0 - 34.0 pg   MCHC 33.2 30.0 - 36.0 g/dL   RDW 13.6 11.5 - 15.5 %   Platelets 294 150 - 400 K/uL   nRBC 0.0 0.0 - 0.2 %  Troponin I (High Sensitivity)  Result Value Ref Range   Troponin I (High Sensitivity) <2 <18 ng/L   DG Chest 2 View  Result Date: 06/13/2022 CLINICAL DATA:  And 11/03/2020 EXAM: CHEST - 2 VIEW COMPARISON:  Chest two views 02/10/2022 and 11/03/2020; CT chest 11/26/2019 FINDINGS: Cardiac silhouette and mediastinal contours are within normal limits. Mild calcification within the aortic arch. Redemonstration of diffuse reticular opacities and mild high-grade interlobular septal thickening consistent with paraseptal emphysematous changes and honeycombing of interstitial lung disease better seen on prior CT. No definite new acute airspace opacity. No pleural effusion pneumothorax. No acute skeletal abnormality. IMPRESSION: Redemonstration of chronic interstitial lung disease. No definite acute airspace opacity. Electronically Signed   By: Yvonne Kendall M.D.   On: 06/13/2022 21:27    EKG EKG Interpretation  Date/Time:  Monday June 13 2022 20:30:45 EST Ventricular Rate:  116 PR Interval:  116 QRS Duration: 90 QT Interval:  306 QTC Calculation: 425 R  Axis:   78 Text Interpretation: Sinus tachycardia Non-specific ST-t changes Confirmed by Lajean Saver [id number]) on 06/13/2022 8:37:00 PM  Radiology DG Chest 2 View  Result Date: 06/13/2022 CLINICAL DATA:  And 11/03/2020 EXAM: CHEST - 2 VIEW COMPARISON:  Chest two views 02/10/2022 and 11/03/2020; CT chest 11/26/2019 FINDINGS: Cardiac silhouette and mediastinal contours are within normal limits. Mild calcification within the aortic arch. Redemonstration of diffuse reticular opacities and mild high-grade interlobular septal thickening consistent with paraseptal emphysematous changes and honeycombing of interstitial lung disease better seen on prior CT. No definite new acute airspace opacity. No pleural effusion pneumothorax. No acute skeletal abnormality. IMPRESSION: Redemonstration of chronic interstitial lung disease. No definite acute airspace opacity. Electronically Signed   By: Yvonne Kendall M.D.   On: 06/13/2022 21:27    Procedures Procedures    Medications Ordered in ED Medications  aspirin chewable tablet 324 mg (324 mg Oral Given 06/13/22 2109)  nitroGLYCERIN (NITROSTAT) SL tablet 0.4 mg (0.4 mg Sublingual Given 06/13/22 2110)  sodium chloride 0.9 % bolus 500 mL (500 mLs Intravenous New Bag/Given 06/13/22 2134)    ED Course/ Medical Decision Making/ A&P                           Medical Decision Making Problems Addressed: Leukocytosis, unspecified type: acute illness or injury Non-productive cough: acute illness or injury Precordial chest pain: acute illness or injury with systemic symptoms that poses a threat to life or bodily functions Tachycardia: acute illness or injury  Amount and/or Complexity of Data Reviewed External Data Reviewed: notes. Labs: ordered. Decision-making details documented in ED Course. Radiology: ordered and independent interpretation performed. Decision-making details documented in ED Course. ECG/medicine tests: ordered and independent interpretation  performed. Decision-making details documented in ED Course.  Risk OTC drugs. Prescription drug management. Decision regarding hospitalization.   Iv ns. Continuous pulse ox and cardiac monitoring. Labs ordered/sent. Imaging ordered.   Diff dx incl acs, pna, ptx, pe, etc - dispo decision including potential need for admission considered - will get labs and imaging and reassess.   Asa po. Ntg sl x 1. Ns bolus.   Reviewed nursing notes and prior charts for additional history. External reports reviewed.   Cardiac monitor: sinus rhythm, rate 110.  Labs reviewed/interpreted by me - initial trop normal. Wbc elev.   Xrays reviewed/interpreted by me - no def pna. Will get ct.   CT reviewed/interpreted by me - pnd.   2155, delta trop, ct, ddimer, covid/flu pending - signed out to Dr Blain Pais to check pending studies, probable admission.           Final Clinical Impression(s) / ED Diagnoses Final diagnoses:  None    Rx / DC Orders ED Discharge Orders     None         Lajean Saver, MD 06/13/22 2157

## 2022-06-13 NOTE — Telephone Encounter (Signed)
Pt walked into the office stating he's been having chest pain, arm tingling and feels like something is sitting on his chest since last night- states his watch has been alerting him that his HR is around 189 since yesterday. States he has been having SOB  Does not have dizziness and has not taken any nitro   Pt is sitting in lobby

## 2022-06-13 NOTE — ED Triage Notes (Signed)
Pt with c/o mid CP since last night. + SOB, + N/V, + lightheadedness, + radiation to jaw

## 2022-06-13 NOTE — Telephone Encounter (Signed)
Reports having chest pain yesterday evening and this morning around 2:45 am while in bed rated 10/10. Reports SOB with activity. Reports having tingling in right arm and hand while having chest pain this morning. Denies dizziness. Denies active chest pain. Does not have nitroglycerin. Says he did not want to go to the ED yesterday because he was scared to death. Says he has been resting all day and is why he didn't go to the ED today. Advised that he needed to be evaluated in the ED now and EMS could be contacted for transport. Declined EMS transport and says his son is on the way to take him to the ED. Advised it would be better to have a driver. Verbalized understanding.

## 2022-06-13 NOTE — ED Provider Notes (Signed)
Patient's delta troponin was 2.  Initial troponin was less than 2 no significant changes there.  Patient's COVID and influenza testing was negative.  CT angio chest no evidence of pulmonary embolus.  There is an area of rounded consolidation within the superior segment of the right lower lobe with associated right hilar and mediastinal adenopathy this could reflect right lower lobe pneumonia though close follow-up is recommended to exclude a neoplasm and they recommend follow-up chest CT in 3 months.  Will inform patient of this.  Regular chest x-ray showed no definitive acute airspace opacity.  Patient with a marked leukocytosis.  This probably fits into the pneumonia.  We will treat the patient with a right lower lobe pneumonia.  I will go ahead and give IV Rocephin here and oral Zithromax and then discharge him home on Zithromax.  Patient has no antibiotic allergies.     Fredia Sorrow, MD 06/13/22 2337

## 2022-06-13 NOTE — Discharge Instructions (Addendum)
CT shows no evidence of blood clot.  No concerns for any acute cardiac event based on your heart markers.  But the CT does show concerns of right lower lobe pneumonia.  Will treat you with IV antibiotic here Rocephin and oral Zithromax and then you will continue Zithromax at home for the next few days.  Prescription printed for you for that.  Start taking the prescription medication tomorrow.  Make an appointment follow-up with your doctor to make sure this resolves.  There is some inflammation may be a questionable mass so they usually want a follow this up with a repeat CT scan in about 3 months to make sure everything is resolved and to make sure that there is no lung mass that is underlying.

## 2022-06-14 ENCOUNTER — Ambulatory Visit: Payer: Medicaid Other | Admitting: Family

## 2022-06-17 ENCOUNTER — Ambulatory Visit: Payer: Medicaid Other | Admitting: Family

## 2022-06-17 ENCOUNTER — Encounter: Payer: Self-pay | Admitting: Family

## 2022-06-17 VITALS — BP 129/77 | HR 90 | Temp 97.8°F | Ht 72.0 in | Wt 146.2 lb

## 2022-06-17 DIAGNOSIS — Z09 Encounter for follow-up examination after completed treatment for conditions other than malignant neoplasm: Secondary | ICD-10-CM | POA: Diagnosis not present

## 2022-06-17 DIAGNOSIS — J189 Pneumonia, unspecified organism: Secondary | ICD-10-CM

## 2022-06-17 DIAGNOSIS — J439 Emphysema, unspecified: Secondary | ICD-10-CM | POA: Diagnosis not present

## 2022-06-17 DIAGNOSIS — R918 Other nonspecific abnormal finding of lung field: Secondary | ICD-10-CM

## 2022-06-17 DIAGNOSIS — J84112 Idiopathic pulmonary fibrosis: Secondary | ICD-10-CM | POA: Diagnosis not present

## 2022-06-17 NOTE — Progress Notes (Signed)
Subjective:    Patient ID: Bradley Rice, male    DOB: 07-28-1961, 61 y.o.   MRN: [id number]  Chief Complaint  Patient presents with   Hospitalization Follow-up   PT presents to the office today for hospital follow up. He went to the ED on 06/13/22 with chest pain and SOB. He had a negative CT angio for PE. They did see " an area of rounded consolidation within the superior segment of the right lower lobe with associated right hilar and mediastinal adenopathy this could reflect right lower lobe pneumonia though close follow-up is recommended to exclude a neoplasm and they recommend follow-up chest CT in 3 months. "  His COVID, Flu, and troponin was negative.   He was given IV Rocephin and oral Zpak.   He is followed by Pulmonologist every 3 months for IPF,  Emphysema, and pulmonary nodules.  Cough This is a new problem. The current episode started 1 to 4 weeks ago. The problem has been waxing and waning. The problem occurs every few minutes. The cough is Non-productive. Associated symptoms include chills, ear congestion (left), ear pain, a fever, nasal congestion, shortness of breath and wheezing. Pertinent negatives include no headaches or myalgias. The symptoms are aggravated by lying down. He has tried rest and OTC cough suppressant for the symptoms.      Review of Systems  Constitutional:  Positive for chills and fever.  HENT:  Positive for ear pain.   Respiratory:  Positive for cough, shortness of breath and wheezing.   Musculoskeletal:  Negative for myalgias.  Neurological:  Negative for headaches.  All other systems reviewed and are negative.      Objective:   Physical Exam Vitals reviewed.  Constitutional:      General: He is not in acute distress.    Appearance: He is well-developed.  HENT:     Head: Normocephalic.     Right Ear: Tympanic membrane normal.     Left Ear: Tympanic membrane normal.  Eyes:     General:        Right eye: No discharge.        Left eye:  No discharge.     Pupils: Pupils are equal, round, and reactive to light.  Neck:     Thyroid: No thyromegaly.  Cardiovascular:     Rate and Rhythm: Normal rate and regular rhythm.     Heart sounds: Normal heart sounds. No murmur heard. Pulmonary:     Effort: Pulmonary effort is normal. No respiratory distress.     Breath sounds: Normal breath sounds. No wheezing.  Abdominal:     General: Bowel sounds are normal. There is no distension.     Palpations: Abdomen is soft.     Tenderness: There is no abdominal tenderness.  Musculoskeletal:        General: No tenderness. Normal range of motion.     Cervical back: Normal range of motion and neck supple.  Skin:    General: Skin is warm and dry.     Findings: No erythema or rash.  Neurological:     Mental Status: He is alert and oriented to person, place, and time.     Cranial Nerves: No cranial nerve deficit.     Deep Tendon Reflexes: Reflexes are normal and symmetric.  Psychiatric:        Behavior: Behavior normal.        Thought Content: Thought content normal.        Judgment: Judgment normal.  BP 129/77   Pulse 90   Temp 97.8 F (36.6 C) (Temporal)   Ht 6' (1.829 m)   Wt 146 lb 3.2 oz (66.3 kg)   SpO2 96%   BMI 19.83 kg/m        Assessment & Plan:  Bradley Rice comes in today with chief complaint of Hospitalization Follow-up   Diagnosis and orders addressed:  1. Pulmonary emphysema, unspecified emphysema type ([place]) - BMP8+EGFR - CBC with Differential/Platelet  2. Pulmonary nodules - BMP8+EGFR - CBC with Differential/Platelet  3. IPF (idiopathic pulmonary fibrosis) ([facility]) - BMP8+EGFR - CBC with Differential/Platelet  4. Community acquired pneumonia of right lung, unspecified part of lung - BMP8+EGFR - CBC with Differential/Platelet  5. Hospital discharge follow-up - BMP8+EGFR - CBC with Differential/Platelet   Labs pending Health Maintenance reviewed Diet and exercise encouraged  Follow up  plan: Keep chronic follow up and follow up with Pulmonologist. Needs repeat CT scan in 3 months     Evelina Dun, FNP

## 2022-06-17 NOTE — Patient Instructions (Signed)
Community-Acquired Pneumonia, Adult Pneumonia is a lung infection that causes inflammation and the buildup of mucus and fluids in the lungs. This may cause coughing and difficulty breathing. Community-acquired pneumonia is pneumonia that develops in people who are not, and have not recently been, in a hospital or other health care facility. Usually, pneumonia develops as a result of an illness that is caused by a virus, such as the common cold and the flu (influenza). It can also be caused by bacteria or fungi. While the common cold and influenza can pass from person to person (are contagious), pneumonia itself is not considered contagious. What are the causes? This condition may be caused by: Viruses. Bacteria. Fungi. What increases the risk? The following factors may make you more likely to develop this condition: Being over age 65 or having certain medical conditions, such as: A long-term (chronic) disease, such as: chronic obstructive pulmonary disease (COPD), asthma, heart failure, diabetes, or kidney disease. A condition that increases the risk of breathing in (aspirating) mucus and other fluids from your mouth and nose. A weakened body defense system (immune system). Having had your spleen removed (splenectomy). The spleen is the organ that helps fight germs and infections. Not cleaning your teeth and gums well (poor dental hygiene). Using tobacco products. Traveling to places where germs that cause pneumonia are present or being near certain animals or animal habitats that could have germs that cause pneumonia. What are the signs or symptoms? Symptoms of this condition include: A dry cough or a wet (productive) cough. A fever, sweating, or chills. Chest pain, especially when breathing deeply or coughing. Fast breathing, difficulty breathing, or shortness of breath. Tiredness (fatigue) and muscle aches. How is this diagnosed? This condition may be diagnosed based on your medical  history or a physical exam. You may also have tests, including: Imaging, such as a chest X-ray or lung ultrasound. Tests of: The level of oxygen and other gases in your blood. Mucus from your lungs (sputum). Fluid around your lungs (pleural fluid). Your urine. How is this treated? Treatment for this condition depends on many factors, such as the cause of your pneumonia, your medicines, and other medical conditions that you have. For most adults, pneumonia may be treated at home. In some cases, treatment must happen in a hospital and may include: Medicines that are given by mouth (orally) or through an IV, including: Antibiotic medicines, if bacteria caused the pneumonia. Medicines that kill viruses (antiviral medicines), if a virus caused the pneumonia. Oxygen therapy. Severe pneumonia, although rare, may require the following treatments: Mechanical ventilation.This procedure uses a machine to help you breathe if you cannot breathe well on your own or maintain a safe level of blood oxygen. Thoracentesis. This procedure removes any buildup of pleural fluid to help with breathing. Follow these instructions at home:  Medicines Take over-the-counter and prescription medicines only as told by your health care provider. Take cough medicine only if you have trouble sleeping. Cough medicine can prevent your body from removing mucus from your lungs. If you were prescribed antibiotics, take them as told by your health care provider. Do not stop taking the antibiotic even if you start to feel better. Lifestyle     Do not drink alcohol. Do not use any products that contain nicotine or tobacco. These products include cigarettes, chewing tobacco, and vaping devices, such as e-cigarettes. If you need help quitting, ask your health care provider. Eat a healthy diet. This includes plenty of vegetables, fruits, whole grains, low-fat   dairy products, and lean protein. General instructions Rest a lot and  get at least 8 hours of sleep each night. Sleep in a partly upright position at night. Place a few pillows under your head or sleep in a reclining chair. Return to your normal activities as told by your health care provider. Ask your health care provider what activities are safe for you. Drink enough fluid to keep your urine pale yellow. This helps to thin the mucus in your lungs. If your throat is sore, gargle with a mixture of salt and water 3-4 times a day or as needed. To make salt water, completely dissolve -1 tsp (3-6 g) of salt in 1 cup (237 mL) of warm water. Keep all follow-up visits. How is this prevented? You can lower your risk of developing community-acquired pneumonia by: Getting the pneumonia vaccine. There are different types and schedules of pneumonia vaccines. Ask your health care provider which option is best for you. Consider getting the pneumonia vaccine if: You are older than 59 years of age. You are 19-65 years of age and are receiving cancer treatment, have chronic lung disease, or have other medical conditions that affect your immune system. Ask your health care provider if this applies to you. Getting your influenza vaccine every year. Ask your health care provider which type of vaccine is best for you. Getting regular dental checkups. Washing your hands often with soap and water for at least 20 seconds. If soap and water are not available, use hand sanitizer. Contact a health care provider if: You have a fever. You have trouble sleeping because you cannot control your cough with cough medicine. Get help right away if: Your shortness of breath becomes worse. Your chest pain increases. Your sickness becomes worse, especially if you are an older adult or have a weak immune system. You cough up blood. These symptoms may be an emergency. Get help right away. Call 911. Do not wait to see if the symptoms will go away. Do not drive yourself to the  hospital. Summary Pneumonia is an infection of the lungs. Community-acquired pneumonia develops in people who have not been in the hospital. It can be caused by bacteria, viruses, or fungi. This condition may be treated with antibiotics or antiviral medicines. Severe pneumonia may require a hospital stay and treatment to help with breathing. This information is not intended to replace advice given to you by your health care provider. Make sure you discuss any questions you have with your health care provider. Document Revised: 09/15/2021 Document Reviewed: 09/15/2021 Elsevier Patient Education  2023 [employer]  

## 2022-06-18 LAB — CBC WITH DIFFERENTIAL/PLATELET
Basophils Absolute: 0.1 10*3/uL (ref 0.0–0.2)
Basos: 1 %
EOS (ABSOLUTE): 0.2 10*3/uL (ref 0.0–0.4)
Eos: 3 %
Hematocrit: 42.3 % (ref 37.5–51.0)
Hemoglobin: 14.3 g/dL (ref 13.0–17.7)
Immature Grans (Abs): 0 10*3/uL (ref 0.0–0.1)
Immature Granulocytes: 0 %
Lymphocytes Absolute: 2.2 10*3/uL (ref 0.7–3.1)
Lymphs: 24 %
MCH: 32.1 pg (ref 26.6–33.0)
MCHC: 33.8 g/dL (ref 31.5–35.7)
MCV: 95 fL (ref 79–97)
Monocytes Absolute: 0.8 10*3/uL (ref 0.1–0.9)
Monocytes: 9 %
Neutrophils Absolute: 5.6 10*3/uL (ref 1.4–7.0)
Neutrophils: 63 %
Platelets: 339 10*3/uL (ref 150–450)
RBC: 4.45 x10E6/uL (ref 4.14–5.80)
RDW: 12 % (ref 11.6–15.4)
WBC: 9 10*3/uL (ref 3.4–10.8)

## 2022-06-18 LAB — BMP8+EGFR
BUN/Creatinine Ratio: 8 — ABNORMAL LOW (ref 9–20)
BUN: 6 mg/dL (ref 6–24)
CO2: 24 mmol/L (ref 20–29)
Calcium: 9.7 mg/dL (ref 8.7–10.2)
Chloride: 100 mmol/L (ref 96–106)
Creatinine, Ser: 0.78 mg/dL (ref 0.76–1.27)
Glucose: 107 mg/dL — ABNORMAL HIGH (ref 70–99)
Potassium: 4.5 mmol/L (ref 3.5–5.2)
Sodium: 139 mmol/L (ref 134–144)
eGFR: 103 mL/min/{1.73_m2} (ref >59)

## 2022-07-01 DIAGNOSIS — R051 Acute cough: Secondary | ICD-10-CM | POA: Diagnosis not present

## 2022-07-01 DIAGNOSIS — J84112 Idiopathic pulmonary fibrosis: Secondary | ICD-10-CM | POA: Diagnosis not present

## 2022-07-01 DIAGNOSIS — J439 Emphysema, unspecified: Secondary | ICD-10-CM | POA: Diagnosis not present

## 2022-07-07 DIAGNOSIS — J984 Other disorders of lung: Secondary | ICD-10-CM | POA: Diagnosis not present

## 2022-07-07 DIAGNOSIS — J439 Emphysema, unspecified: Secondary | ICD-10-CM | POA: Diagnosis not present

## 2022-07-07 DIAGNOSIS — M5431 Sciatica, right side: Secondary | ICD-10-CM | POA: Diagnosis not present

## 2022-07-07 DIAGNOSIS — R52 Pain, unspecified: Secondary | ICD-10-CM | POA: Diagnosis not present

## 2022-07-07 DIAGNOSIS — I10 Essential (primary) hypertension: Secondary | ICD-10-CM | POA: Diagnosis not present

## 2022-07-07 DIAGNOSIS — M961 Postlaminectomy syndrome, not elsewhere classified: Secondary | ICD-10-CM | POA: Diagnosis not present

## 2022-07-07 DIAGNOSIS — B999 Unspecified infectious disease: Secondary | ICD-10-CM | POA: Diagnosis not present

## 2022-07-07 DIAGNOSIS — G894 Chronic pain syndrome: Secondary | ICD-10-CM | POA: Diagnosis not present

## 2022-07-07 DIAGNOSIS — G8929 Other chronic pain: Secondary | ICD-10-CM | POA: Diagnosis not present

## 2022-07-07 DIAGNOSIS — M479 Spondylosis, unspecified: Secondary | ICD-10-CM | POA: Diagnosis not present

## 2022-07-07 DIAGNOSIS — Z79891 Long term (current) use of opiate analgesic: Secondary | ICD-10-CM | POA: Diagnosis not present

## 2022-08-01 DIAGNOSIS — J84112 Idiopathic pulmonary fibrosis: Secondary | ICD-10-CM | POA: Diagnosis not present

## 2022-08-01 DIAGNOSIS — J439 Emphysema, unspecified: Secondary | ICD-10-CM | POA: Diagnosis not present

## 2022-08-04 DIAGNOSIS — J439 Emphysema, unspecified: Secondary | ICD-10-CM | POA: Diagnosis not present

## 2022-08-04 DIAGNOSIS — Z79891 Long term (current) use of opiate analgesic: Secondary | ICD-10-CM | POA: Diagnosis not present

## 2022-08-04 DIAGNOSIS — I10 Essential (primary) hypertension: Secondary | ICD-10-CM | POA: Diagnosis not present

## 2022-08-04 DIAGNOSIS — G894 Chronic pain syndrome: Secondary | ICD-10-CM | POA: Diagnosis not present

## 2022-08-04 DIAGNOSIS — G8929 Other chronic pain: Secondary | ICD-10-CM | POA: Diagnosis not present

## 2022-08-04 DIAGNOSIS — B999 Unspecified infectious disease: Secondary | ICD-10-CM | POA: Diagnosis not present

## 2022-08-04 DIAGNOSIS — J984 Other disorders of lung: Secondary | ICD-10-CM | POA: Diagnosis not present

## 2022-08-04 DIAGNOSIS — M5431 Sciatica, right side: Secondary | ICD-10-CM | POA: Diagnosis not present

## 2022-08-04 DIAGNOSIS — M479 Spondylosis, unspecified: Secondary | ICD-10-CM | POA: Diagnosis not present

## 2022-08-04 DIAGNOSIS — R52 Pain, unspecified: Secondary | ICD-10-CM | POA: Diagnosis not present

## 2022-08-04 DIAGNOSIS — M961 Postlaminectomy syndrome, not elsewhere classified: Secondary | ICD-10-CM | POA: Diagnosis not present

## 2022-08-08 ENCOUNTER — Telehealth: Payer: Self-pay | Admitting: Family

## 2022-08-19 DIAGNOSIS — R051 Acute cough: Secondary | ICD-10-CM | POA: Diagnosis not present

## 2022-08-19 DIAGNOSIS — J84112 Idiopathic pulmonary fibrosis: Secondary | ICD-10-CM | POA: Diagnosis not present

## 2022-08-19 DIAGNOSIS — J439 Emphysema, unspecified: Secondary | ICD-10-CM | POA: Diagnosis not present

## 2022-09-01 ENCOUNTER — Encounter: Payer: Self-pay | Admitting: Family

## 2022-09-01 ENCOUNTER — Ambulatory Visit: Payer: Medicaid Other | Admitting: Family

## 2022-09-01 VITALS — BP 140/83 | HR 102 | Temp 98.3°F | Ht 73.0 in | Wt 151.8 lb

## 2022-09-01 DIAGNOSIS — Z87891 Personal history of nicotine dependence: Secondary | ICD-10-CM

## 2022-09-01 DIAGNOSIS — F411 Generalized anxiety disorder: Secondary | ICD-10-CM

## 2022-09-01 DIAGNOSIS — J439 Emphysema, unspecified: Secondary | ICD-10-CM | POA: Diagnosis not present

## 2022-09-01 DIAGNOSIS — Z0001 Encounter for general adult medical examination with abnormal findings: Secondary | ICD-10-CM

## 2022-09-01 DIAGNOSIS — Z1211 Encounter for screening for malignant neoplasm of colon: Secondary | ICD-10-CM | POA: Diagnosis not present

## 2022-09-01 DIAGNOSIS — Z23 Encounter for immunization: Secondary | ICD-10-CM | POA: Diagnosis not present

## 2022-09-01 DIAGNOSIS — M545 Low back pain, unspecified: Secondary | ICD-10-CM

## 2022-09-01 DIAGNOSIS — G8929 Other chronic pain: Secondary | ICD-10-CM | POA: Diagnosis not present

## 2022-09-01 DIAGNOSIS — I10 Essential (primary) hypertension: Secondary | ICD-10-CM | POA: Diagnosis not present

## 2022-09-01 DIAGNOSIS — F5101 Primary insomnia: Secondary | ICD-10-CM | POA: Diagnosis not present

## 2022-09-01 DIAGNOSIS — Z0000 Encounter for general adult medical examination without abnormal findings: Secondary | ICD-10-CM | POA: Diagnosis not present

## 2022-09-01 DIAGNOSIS — F331 Major depressive disorder, recurrent, moderate: Secondary | ICD-10-CM | POA: Diagnosis not present

## 2022-09-01 DIAGNOSIS — J84112 Idiopathic pulmonary fibrosis: Secondary | ICD-10-CM | POA: Diagnosis not present

## 2022-09-01 MED ORDER — DULOXETINE HCL 60 MG PO CPEP: 60.0000 mg | ORAL_CAPSULE | Freq: Every day | ORAL | 1 refills | Status: DC

## 2022-09-01 MED ORDER — TRAZODONE HCL 50 MG PO TABS: 50.0000 mg | ORAL_TABLET | Freq: Every day | ORAL | 1 refills | Status: DC

## 2022-09-01 MED ORDER — IBUPROFEN 600 MG PO TABS: 600.0000 mg | ORAL_TABLET | Freq: Three times a day (TID) | ORAL | 1 refills | Status: DC | PRN

## 2022-09-01 MED ORDER — METOPROLOL TARTRATE 50 MG PO TABS: 50.0000 mg | ORAL_TABLET | Freq: Two times a day (BID) | ORAL | 1 refills | Status: DC

## 2022-09-01 NOTE — Progress Notes (Signed)
Subjective:    Patient ID: [name], male    DOB: 08/28/1960, 62 y.o.   MRN: [id number]  Chief Complaint  Patient presents with   Medical Management of Chronic Issues   Referral    New pain management doctor    Pt presents to the office today for CPE and chronic follow up.  He is followed by Pain clinic every  month for chronic pain, back pian, and neck pain. However, he is having to do paperwork every month to fill out to get medications.    He is followed by [facility] every 3 months for chronic neck pain and hx of cervical fusion.     He is followed by Pulmonologist for idiopathic pulmonary fibrosis every 3 months.    He has Emphysema and quit smoking  apporx 2019. Has SOB but also has pulmonary fibrosis.  Hypertension This is a chronic problem. The current episode started more than 1 year ago. The problem has been waxing and waning since onset. The problem is uncontrolled. Associated symptoms include anxiety, malaise/fatigue and shortness of breath. Pertinent negatives include no peripheral edema. Risk factors for coronary artery disease include dyslipidemia, male gender, smoking/tobacco exposure and sedentary lifestyle. The current treatment provides moderate improvement.  Anxiety Presents for follow-up visit. Symptoms include depressed mood, insomnia, irritability, nervous/anxious behavior and shortness of breath. Symptoms occur occasionally. The severity of symptoms is moderate.    Depression        This is a chronic problem.  The current episode started more than 1 year ago.   The onset quality is gradual.   The problem occurs intermittently.  Associated symptoms include helplessness, hopelessness and insomnia.  Past medical history includes anxiety.   Back Pain This is a chronic problem. The current episode started more than 1 year ago. The problem occurs intermittently. The pain is present in the lumbar spine. The pain is at a severity of 10/10. The pain is moderate. He  has tried analgesics for the symptoms. The treatment provided moderate relief.  Insomnia Primary symptoms: difficulty falling asleep, frequent awakening, malaise/fatigue.   The current episode started more than one year. The onset quality is gradual. The problem occurs intermittently. Past treatments include meditation. The treatment provided moderate relief. PMH includes: depression.       Review of Systems  Constitutional:  Positive for irritability and malaise/fatigue.  Respiratory:  Positive for shortness of breath.   Musculoskeletal:  Positive for back pain.  Psychiatric/Behavioral:  Positive for depression. The patient is nervous/anxious and has insomnia.   All other systems reviewed and are negative.  Family History  Problem Relation Age of Onset   ALS Mother    Cancer Mother    Cancer Sister    ALS Sister    Cancer Brother        1 brother   Anesthesia problems Neg Hx    Social History   Socioeconomic History   Marital status: Married    Spouse name: Hassan Rowan   Number of children: 2   Years of education: 10th   Highest education level: Not on file  Occupational History   Occupation: Disability    Employer: [employer]  Tobacco Use   Smoking status: Former    Packs/day: 1.00    Years: 30.00    Total pack years: 30.00    Types: Cigarettes    Quit date: 10/2015    Years since quitting: 6.8   Smokeless tobacco: Never  Vaping Use   Vaping Use:  Never used  Substance and Sexual Activity   Alcohol use: No    Alcohol/week: 0.0 standard drinks of alcohol   Drug use: No   Sexual activity: Yes  Other Topics Concern   Not on file  Social History Narrative   Patient lives at home with his spouse.   Caffeine Use: coffee   Social Determinants of Radio broadcast assistant Strain: Not on file  Food Insecurity: Not on file  Transportation Needs: Not on file  Physical Activity: Not on file  Stress: Not on file  Social Connections: Not on file       Objective:    Physical Exam Vitals reviewed.  Constitutional:      General: He is not in acute distress.    Appearance: He is well-developed.  HENT:     Head: Normocephalic.     Right Ear: Tympanic membrane normal.     Left Ear: Tympanic membrane normal.  Eyes:     General:        Right eye: No discharge.        Left eye: No discharge.     Pupils: Pupils are equal, round, and reactive to light.  Neck:     Thyroid: No thyromegaly.  Cardiovascular:     Rate and Rhythm: Normal rate and regular rhythm.     Heart sounds: Normal heart sounds. No murmur heard. Pulmonary:     Effort: Pulmonary effort is normal. No respiratory distress.     Breath sounds: Normal breath sounds. No wheezing.  Abdominal:     General: Bowel sounds are normal. There is no distension.     Palpations: Abdomen is soft.     Tenderness: There is no abdominal tenderness.  Musculoskeletal:        General: No tenderness. Normal range of motion.     Cervical back: Normal range of motion and neck supple.  Skin:    General: Skin is warm and dry.     Findings: No erythema or rash.  Neurological:     Mental Status: He is alert and oriented to person, place, and time.     Cranial Nerves: No cranial nerve deficit.     Deep Tendon Reflexes: Reflexes are normal and symmetric.  Psychiatric:        Behavior: Behavior normal.        Thought Content: Thought content normal.        Judgment: Judgment normal.       BP (!) 140/83   Pulse (!) 102   Temp 98.3 F (36.8 C) (Temporal)   Ht 6\' 1"  (1.854 m)   Wt 151 lb 12.8 oz (68.9 kg)   SpO2 98%   BMI 20.03 kg/m      Assessment & Plan:

## 2022-09-01 NOTE — Patient Instructions (Signed)
Health Maintenance, Male Adopting a healthy lifestyle and getting preventive care are important in promoting health and wellness. Ask your health care provider about: The right schedule for you to have regular tests and exams. Things you can do on your own to prevent diseases and keep yourself healthy. What should I know about diet, weight, and exercise? Eat a healthy diet  Eat a diet that includes plenty of vegetables, fruits, low-fat dairy products, and lean protein. Do not eat a lot of foods that are high in solid fats, added sugars, or sodium. Maintain a healthy weight Body mass index (BMI) is a measurement that can be used to identify possible weight problems. It estimates body fat based on height and weight. Your health care provider can help determine your BMI and help you achieve or maintain a healthy weight. Get regular exercise Get regular exercise. This is one of the most important things you can do for your health. Most adults should: Exercise for at least 150 minutes each week. The exercise should increase your heart rate and make you sweat (moderate-intensity exercise). Do strengthening exercises at least twice a week. This is in addition to the moderate-intensity exercise. Spend less time sitting. Even light physical activity can be beneficial. Watch cholesterol and blood lipids Have your blood tested for lipids and cholesterol at 60 years of age, then have this test every 5 years. You may need to have your cholesterol levels checked more often if: Your lipid or cholesterol levels are high. You are older than 60 years of age. You are at high risk for heart disease. What should I know about cancer screening? Many types of cancers can be detected early and may often be prevented. Depending on your health history and family history, you may need to have cancer screening at various ages. This may include screening for: Colorectal cancer. Prostate cancer. Skin cancer. Lung  cancer. What should I know about heart disease, diabetes, and high blood pressure? Blood pressure and heart disease High blood pressure causes heart disease and increases the risk of stroke. This is more likely to develop in people who have high blood pressure readings or are overweight. Talk with your health care provider about your target blood pressure readings. Have your blood pressure checked: Every 3-5 years if you are 18-39 years of age. Every year if you are 40 years old or older. If you are between the ages of 65 and 75 and are a current or former smoker, ask your health care provider if you should have a one-time screening for abdominal aortic aneurysm (AAA). Diabetes Have regular diabetes screenings. This checks your fasting blood sugar level. Have the screening done: Once every three years after age 45 if you are at a normal weight and have a low risk for diabetes. More often and at a younger age if you are overweight or have a high risk for diabetes. What should I know about preventing infection? Hepatitis B If you have a higher risk for hepatitis B, you should be screened for this virus. Talk with your health care provider to find out if you are at risk for hepatitis B infection. Hepatitis C Blood testing is recommended for: Everyone born from 1945 through 1965. Anyone with known risk factors for hepatitis C. Sexually transmitted infections (STIs) You should be screened each year for STIs, including gonorrhea and chlamydia, if: You are sexually active and are younger than 60 years of age. You are older than 60 years of age and your   health care provider tells you that you are at risk for this type of infection. Your sexual activity has changed since you were last screened, and you are at increased risk for chlamydia or gonorrhea. Ask your health care provider if you are at risk. Ask your health care provider about whether you are at high risk for HIV. Your health care provider  may recommend a prescription medicine to help prevent HIV infection. If you choose to take medicine to prevent HIV, you should first get tested for HIV. You should then be tested every 3 months for as long as you are taking the medicine. Follow these instructions at home: Alcohol use Do not drink alcohol if your health care provider tells you not to drink. If you drink alcohol: Limit how much you have to 0-2 drinks a day. Know how much alcohol is in your drink. In the [place] one drink equals one 12 oz bottle of beer (355 mL), one 5 oz glass of wine (148 mL), or one 1 oz glass of hard liquor (44 mL). Lifestyle Do not use any products that contain nicotine or tobacco. These products include cigarettes, chewing tobacco, and vaping devices, such as e-cigarettes. If you need help quitting, ask your health care provider. Do not use street drugs. Do not share needles. Ask your health care provider for help if you need support or information about quitting drugs. General instructions Schedule regular health, dental, and eye exams. Stay current with your vaccines. Tell your health care provider if: You often feel depressed. You have ever been abused or do not feel safe at home. Summary Adopting a healthy lifestyle and getting preventive care are important in promoting health and wellness. Follow your health care provider's instructions about healthy diet, exercising, and getting tested or screened for diseases. Follow your health care provider's instructions on monitoring your cholesterol and blood pressure. This information is not intended to replace advice given to you by your health care provider. Make sure you discuss any questions you have with your health care provider. Document Revised: 12/07/2020 Document Reviewed: 12/07/2020 [employer] Patient Education  2023 [employer]  

## 2022-09-02 LAB — CMP14+EGFR
ALT: 11 IU/L (ref 0–44)
AST: 13 IU/L (ref 0–40)
Albumin/Globulin Ratio: 1.2 (ref 1.2–2.2)
Albumin: 4 g/dL (ref 3.8–4.9)
Alkaline Phosphatase: 97 IU/L (ref 44–121)
BUN/Creatinine Ratio: 13 (ref 9–20)
BUN: 10 mg/dL (ref 6–24)
Bilirubin Total: <0.2 mg/dL (ref 0.0–1.2)
CO2: 23 mmol/L (ref 20–29)
Calcium: 9.7 mg/dL (ref 8.7–10.2)
Chloride: 104 mmol/L (ref 96–106)
Creatinine, Ser: 0.8 mg/dL (ref 0.76–1.27)
Globulin, Total: 3.4 g/dL (ref 1.5–4.5)
Glucose: 70 mg/dL (ref 70–99)
Potassium: 4.4 mmol/L (ref 3.5–5.2)
Sodium: 142 mmol/L (ref 134–144)
Total Protein: 7.4 g/dL (ref 6.0–8.5)
eGFR: 102 mL/min/{1.73_m2} (ref >59)

## 2022-09-02 LAB — CBC WITH DIFFERENTIAL/PLATELET
Basophils Absolute: 0.2 10*3/uL (ref 0.0–0.2)
Basos: 2 %
EOS (ABSOLUTE): 0.3 10*3/uL (ref 0.0–0.4)
Eos: 3 %
Hematocrit: 43 % (ref 37.5–51.0)
Hemoglobin: 14.6 g/dL (ref 13.0–17.7)
Immature Grans (Abs): 0.1 10*3/uL (ref 0.0–0.1)
Immature Granulocytes: 1 %
Lymphocytes Absolute: 2.7 10*3/uL (ref 0.7–3.1)
Lymphs: 24 %
MCH: 31.9 pg (ref 26.6–33.0)
MCHC: 34 g/dL (ref 31.5–35.7)
MCV: 94 fL (ref 79–97)
Monocytes Absolute: 0.8 10*3/uL (ref 0.1–0.9)
Monocytes: 7 %
Neutrophils Absolute: 7.1 10*3/uL — ABNORMAL HIGH (ref 1.4–7.0)
Neutrophils: 63 %
Platelets: 463 10*3/uL — ABNORMAL HIGH (ref 150–450)
RBC: 4.58 x10E6/uL (ref 4.14–5.80)
RDW: 12.2 % (ref 11.6–15.4)
WBC: 11.1 10*3/uL — ABNORMAL HIGH (ref 3.4–10.8)

## 2022-09-02 LAB — LIPID PANEL
Chol/HDL Ratio: 3.9 ratio (ref 0.0–5.0)
Cholesterol, Total: 144 mg/dL (ref 100–199)
HDL: 37 mg/dL — ABNORMAL LOW (ref >39)
LDL Chol Calc (NIH): 89 mg/dL (ref 0–99)
Triglycerides: 97 mg/dL (ref 0–149)
VLDL Cholesterol Cal: 18 mg/dL (ref 5–40)

## 2022-09-02 LAB — TSH: TSH: 1.69 u[IU]/mL (ref 0.450–4.500)

## 2022-09-02 LAB — PSA, TOTAL AND FREE
PSA, Free Pct: 23.4 %
PSA, Free: 0.68 ng/mL
Prostate Specific Ag, Serum: 2.9 ng/mL (ref 0.0–4.0)

## 2022-09-05 DIAGNOSIS — R634 Abnormal weight loss: Secondary | ICD-10-CM | POA: Diagnosis not present

## 2022-09-19 ENCOUNTER — Ambulatory Visit: Payer: Medicaid Other | Admitting: Family

## 2022-09-19 DIAGNOSIS — J439 Emphysema, unspecified: Secondary | ICD-10-CM | POA: Diagnosis not present

## 2022-09-19 DIAGNOSIS — R051 Acute cough: Secondary | ICD-10-CM | POA: Diagnosis not present

## 2022-09-19 DIAGNOSIS — J84112 Idiopathic pulmonary fibrosis: Secondary | ICD-10-CM | POA: Diagnosis not present

## 2022-09-23 DIAGNOSIS — I10 Essential (primary) hypertension: Secondary | ICD-10-CM | POA: Diagnosis not present

## 2022-09-23 DIAGNOSIS — J984 Other disorders of lung: Secondary | ICD-10-CM | POA: Diagnosis not present

## 2022-09-23 DIAGNOSIS — J439 Emphysema, unspecified: Secondary | ICD-10-CM | POA: Diagnosis not present

## 2022-09-23 DIAGNOSIS — R52 Pain, unspecified: Secondary | ICD-10-CM | POA: Diagnosis not present

## 2022-09-23 DIAGNOSIS — M479 Spondylosis, unspecified: Secondary | ICD-10-CM | POA: Diagnosis not present

## 2022-09-23 DIAGNOSIS — M5431 Sciatica, right side: Secondary | ICD-10-CM | POA: Diagnosis not present

## 2022-09-23 DIAGNOSIS — M961 Postlaminectomy syndrome, not elsewhere classified: Secondary | ICD-10-CM | POA: Diagnosis not present

## 2022-09-23 DIAGNOSIS — G8929 Other chronic pain: Secondary | ICD-10-CM | POA: Diagnosis not present

## 2022-09-30 DIAGNOSIS — J439 Emphysema, unspecified: Secondary | ICD-10-CM | POA: Diagnosis not present

## 2022-09-30 DIAGNOSIS — J84112 Idiopathic pulmonary fibrosis: Secondary | ICD-10-CM | POA: Diagnosis not present

## 2022-10-06 ENCOUNTER — Other Ambulatory Visit: Payer: Self-pay | Admitting: Family

## 2022-10-06 DIAGNOSIS — M545 Low back pain, unspecified: Secondary | ICD-10-CM

## 2022-10-06 DIAGNOSIS — G8929 Other chronic pain: Secondary | ICD-10-CM

## 2022-10-18 DIAGNOSIS — R051 Acute cough: Secondary | ICD-10-CM | POA: Diagnosis not present

## 2022-10-18 DIAGNOSIS — J439 Emphysema, unspecified: Secondary | ICD-10-CM | POA: Diagnosis not present

## 2022-10-18 DIAGNOSIS — J84112 Idiopathic pulmonary fibrosis: Secondary | ICD-10-CM | POA: Diagnosis not present

## 2022-10-20 DIAGNOSIS — R509 Fever, unspecified: Secondary | ICD-10-CM | POA: Diagnosis not present

## 2022-10-20 DIAGNOSIS — Z20822 Contact with and (suspected) exposure to covid-19: Secondary | ICD-10-CM | POA: Diagnosis not present

## 2022-10-20 DIAGNOSIS — H6692 Otitis media, unspecified, left ear: Secondary | ICD-10-CM | POA: Diagnosis not present

## 2022-10-24 DIAGNOSIS — J209 Acute bronchitis, unspecified: Secondary | ICD-10-CM | POA: Diagnosis not present

## 2022-10-24 DIAGNOSIS — R55 Syncope and collapse: Secondary | ICD-10-CM | POA: Diagnosis not present

## 2022-10-24 DIAGNOSIS — Z20822 Contact with and (suspected) exposure to covid-19: Secondary | ICD-10-CM | POA: Diagnosis not present

## 2022-10-24 DIAGNOSIS — R0600 Dyspnea, unspecified: Secondary | ICD-10-CM | POA: Diagnosis not present

## 2022-10-31 DIAGNOSIS — J439 Emphysema, unspecified: Secondary | ICD-10-CM | POA: Diagnosis not present

## 2022-10-31 DIAGNOSIS — J84112 Idiopathic pulmonary fibrosis: Secondary | ICD-10-CM | POA: Diagnosis not present

## 2022-11-08 ENCOUNTER — Emergency Department (HOSPITAL_COMMUNITY): Payer: Medicaid Other

## 2022-11-08 ENCOUNTER — Encounter (HOSPITAL_COMMUNITY): Payer: Self-pay

## 2022-11-08 ENCOUNTER — Other Ambulatory Visit: Payer: Self-pay

## 2022-11-08 ENCOUNTER — Inpatient Hospital Stay (HOSPITAL_COMMUNITY)
Admission: EM | Admit: 2022-11-08 | Discharge: 2022-11-11 | DRG: 193 | Disposition: A | Discharge status: Home / Self Care | Payer: Medicaid Other | Attending: Family Medicine | Admitting: Family Medicine

## 2022-11-08 DIAGNOSIS — J8410 Pulmonary fibrosis, unspecified: Secondary | ICD-10-CM | POA: Diagnosis not present

## 2022-11-08 DIAGNOSIS — F419 Anxiety disorder, unspecified: Secondary | ICD-10-CM | POA: Diagnosis not present

## 2022-11-08 DIAGNOSIS — R0602 Shortness of breath: Secondary | ICD-10-CM | POA: Diagnosis not present

## 2022-11-08 DIAGNOSIS — J84112 Idiopathic pulmonary fibrosis: Secondary | ICD-10-CM | POA: Diagnosis present

## 2022-11-08 DIAGNOSIS — Z681 Body mass index (BMI) 19 or less, adult: Secondary | ICD-10-CM

## 2022-11-08 DIAGNOSIS — F32A Depression, unspecified: Secondary | ICD-10-CM | POA: Diagnosis not present

## 2022-11-08 DIAGNOSIS — R918 Other nonspecific abnormal finding of lung field: Secondary | ICD-10-CM

## 2022-11-08 DIAGNOSIS — R0902 Hypoxemia: Secondary | ICD-10-CM | POA: Diagnosis not present

## 2022-11-08 DIAGNOSIS — R000 Tachycardia, unspecified: Secondary | ICD-10-CM | POA: Diagnosis not present

## 2022-11-08 DIAGNOSIS — Z85118 Personal history of other malignant neoplasm of bronchus and lung: Secondary | ICD-10-CM

## 2022-11-08 DIAGNOSIS — Z8673 Personal history of transient ischemic attack (TIA), and cerebral infarction without residual deficits: Secondary | ICD-10-CM

## 2022-11-08 DIAGNOSIS — I517 Cardiomegaly: Secondary | ICD-10-CM | POA: Diagnosis present

## 2022-11-08 DIAGNOSIS — Z981 Arthrodesis status: Secondary | ICD-10-CM

## 2022-11-08 DIAGNOSIS — J189 Pneumonia, unspecified organism: Secondary | ICD-10-CM | POA: Diagnosis not present

## 2022-11-08 DIAGNOSIS — Z1152 Encounter for screening for COVID-19: Secondary | ICD-10-CM

## 2022-11-08 DIAGNOSIS — E8809 Other disorders of plasma-protein metabolism, not elsewhere classified: Secondary | ICD-10-CM | POA: Diagnosis present

## 2022-11-08 DIAGNOSIS — Z87891 Personal history of nicotine dependence: Secondary | ICD-10-CM

## 2022-11-08 DIAGNOSIS — I3139 Other pericardial effusion (noninflammatory): Secondary | ICD-10-CM | POA: Diagnosis present

## 2022-11-08 DIAGNOSIS — I700 Atherosclerosis of aorta: Secondary | ICD-10-CM | POA: Diagnosis present

## 2022-11-08 DIAGNOSIS — J9621 Acute and chronic respiratory failure with hypoxia: Secondary | ICD-10-CM | POA: Diagnosis not present

## 2022-11-08 DIAGNOSIS — F5101 Primary insomnia: Secondary | ICD-10-CM

## 2022-11-08 DIAGNOSIS — Z888 Allergy status to other drugs, medicaments and biological substances status: Secondary | ICD-10-CM | POA: Diagnosis not present

## 2022-11-08 DIAGNOSIS — Z79899 Other long term (current) drug therapy: Secondary | ICD-10-CM

## 2022-11-08 DIAGNOSIS — J440 Chronic obstructive pulmonary disease with acute lower respiratory infection: Secondary | ICD-10-CM | POA: Diagnosis present

## 2022-11-08 DIAGNOSIS — J90 Pleural effusion, not elsewhere classified: Secondary | ICD-10-CM | POA: Insufficient documentation

## 2022-11-08 DIAGNOSIS — I2510 Atherosclerotic heart disease of native coronary artery without angina pectoris: Secondary | ICD-10-CM | POA: Diagnosis present

## 2022-11-08 DIAGNOSIS — E46 Unspecified protein-calorie malnutrition: Secondary | ICD-10-CM | POA: Diagnosis not present

## 2022-11-08 DIAGNOSIS — G1221 Amyotrophic lateral sclerosis: Secondary | ICD-10-CM | POA: Diagnosis not present

## 2022-11-08 DIAGNOSIS — I10 Essential (primary) hypertension: Secondary | ICD-10-CM

## 2022-11-08 LAB — BLOOD GAS, ARTERIAL
Acid-Base Excess: 6.9 mmol/L — ABNORMAL HIGH (ref 0.0–2.0)
Bicarbonate: 31.3 mmol/L — ABNORMAL HIGH (ref 20.0–28.0)
Drawn by: 23430
O2 Saturation: 95.8 %
Patient temperature: 37.3
pCO2 arterial: 44 mmHg (ref 32–48)
pH, Arterial: 7.47 — ABNORMAL HIGH (ref 7.35–7.45)
pO2, Arterial: 71 mmHg — ABNORMAL LOW (ref 83–108)

## 2022-11-08 LAB — COMPREHENSIVE METABOLIC PANEL
ALT: 31 U/L (ref 0–44)
AST: 23 U/L (ref 15–41)
Albumin: 2.6 g/dL — ABNORMAL LOW (ref 3.5–5.0)
Alkaline Phosphatase: 110 U/L (ref 38–126)
Anion gap: 9 (ref 5–15)
BUN: 9 mg/dL (ref 6–20)
CO2: 27 mmol/L (ref 22–32)
Calcium: 8.7 mg/dL — ABNORMAL LOW (ref 8.9–10.3)
Chloride: 102 mmol/L (ref 98–111)
Creatinine, Ser: 0.69 mg/dL (ref 0.61–1.24)
GFR, Estimated: >60 mL/min (ref >60)
Glucose, Bld: 111 mg/dL — ABNORMAL HIGH (ref 70–99)
Potassium: 3.5 mmol/L (ref 3.5–5.1)
Sodium: 138 mmol/L (ref 135–145)
Total Bilirubin: 1 mg/dL (ref 0.3–1.2)
Total Protein: 7 g/dL (ref 6.5–8.1)

## 2022-11-08 LAB — CBC WITH DIFFERENTIAL/PLATELET
Abs Immature Granulocytes: 0.1 10*3/uL — ABNORMAL HIGH (ref 0.00–0.07)
Basophils Absolute: 0.1 10*3/uL (ref 0.0–0.1)
Basophils Relative: 1 %
Eosinophils Absolute: 0.1 10*3/uL (ref 0.0–0.5)
Eosinophils Relative: 1 %
HCT: 37.2 % — ABNORMAL LOW (ref 39.0–52.0)
Hemoglobin: 11.7 g/dL — ABNORMAL LOW (ref 13.0–17.0)
Immature Granulocytes: 1 %
Lymphocytes Relative: 5 %
Lymphs Abs: 0.9 10*3/uL (ref 0.7–4.0)
MCH: 29.7 pg (ref 26.0–34.0)
MCHC: 31.5 g/dL (ref 30.0–36.0)
MCV: 94.4 fL (ref 80.0–100.0)
Monocytes Absolute: 1.2 10*3/uL — ABNORMAL HIGH (ref 0.1–1.0)
Monocytes Relative: 8 %
Neutro Abs: 14 10*3/uL — ABNORMAL HIGH (ref 1.7–7.7)
Neutrophils Relative %: 84 %
Platelets: 385 10*3/uL (ref 150–400)
RBC: 3.94 MIL/uL — ABNORMAL LOW (ref 4.22–5.81)
RDW: 13.8 % (ref 11.5–15.5)
WBC: 16.4 10*3/uL — ABNORMAL HIGH (ref 4.0–10.5)
nRBC: 0 % (ref 0.0–0.2)

## 2022-11-08 LAB — LACTIC ACID, PLASMA
Lactic Acid, Venous: 1.1 mmol/L (ref 0.5–1.9)
Lactic Acid, Venous: 1 mmol/L (ref 0.5–1.9)

## 2022-11-08 LAB — PROTIME-INR
INR: 1.3 — ABNORMAL HIGH (ref 0.8–1.2)
Prothrombin Time: 15.9 seconds — ABNORMAL HIGH (ref 11.4–15.2)

## 2022-11-08 LAB — APTT: aPTT: 43 seconds — ABNORMAL HIGH (ref 24–36)

## 2022-11-08 LAB — RESP PANEL BY RT-PCR (RSV, FLU A&B, COVID)  RVPGX2
Influenza A by PCR: NEGATIVE
Influenza B by PCR: NEGATIVE
Resp Syncytial Virus by PCR: NEGATIVE
SARS Coronavirus 2 by RT PCR: NEGATIVE

## 2022-11-08 LAB — CULTURE, BLOOD (ROUTINE X 2)

## 2022-11-08 MED ORDER — SODIUM CHLORIDE 0.9 % IV BOLUS
1000.0000 mL | Freq: Once | INTRAVENOUS | Status: AC
  Administered 2022-11-08: 1000 mL via INTRAVENOUS

## 2022-11-08 MED ORDER — ONDANSETRON HCL 4 MG/2ML IJ SOLN: 4.0000 mg | Freq: Four times a day (QID) | INTRAMUSCULAR | Status: DC | PRN

## 2022-11-08 MED ORDER — METHYLPREDNISOLONE SODIUM SUCC 125 MG IJ SOLR
125.0000 mg | Freq: Once | INTRAMUSCULAR | Status: AC
  Administered 2022-11-08: 125 mg via INTRAVENOUS
  Filled 2022-11-08: qty 2

## 2022-11-08 MED ORDER — SODIUM CHLORIDE 0.9 % IV SOLN
1.0000 g | Freq: Once | INTRAVENOUS | Status: AC
  Administered 2022-11-08: 1 g via INTRAVENOUS
  Filled 2022-11-08: qty 10

## 2022-11-08 MED ORDER — ACETAMINOPHEN 650 MG RE SUPP: 650.0000 mg | Freq: Four times a day (QID) | RECTAL | Status: DC | PRN

## 2022-11-08 MED ORDER — SODIUM CHLORIDE 0.9 % IV SOLN
500.0000 mg | Freq: Once | INTRAVENOUS | Status: AC
  Administered 2022-11-08: 500 mg via INTRAVENOUS
  Filled 2022-11-08: qty 5

## 2022-11-08 MED ORDER — IOHEXOL 350 MG/ML SOLN
100.0000 mL | Freq: Once | INTRAVENOUS | Status: AC | PRN
  Administered 2022-11-08: 100 mL via INTRAVENOUS

## 2022-11-08 MED ORDER — ACETAMINOPHEN 325 MG PO TABS
650.0000 mg | ORAL_TABLET | Freq: Four times a day (QID) | ORAL | Status: DC | PRN
  Administered 2022-11-09: 650 mg via ORAL
  Filled 2022-11-08: qty 2

## 2022-11-08 MED ORDER — ONDANSETRON HCL 4 MG PO TABS: 4.0000 mg | ORAL_TABLET | Freq: Four times a day (QID) | ORAL | Status: DC | PRN

## 2022-11-08 MED ORDER — ENOXAPARIN SODIUM 40 MG/0.4ML IJ SOSY
40.0000 mg | PREFILLED_SYRINGE | Freq: Every day | INTRAMUSCULAR | Status: DC
  Filled 2022-11-08 (×3): qty 0.4

## 2022-11-08 MED ORDER — IPRATROPIUM-ALBUTEROL 0.5-2.5 (3) MG/3ML IN SOLN
3.0000 mL | Freq: Once | RESPIRATORY_TRACT | Status: AC
  Administered 2022-11-08: 3 mL via RESPIRATORY_TRACT

## 2022-11-08 NOTE — ED Provider Notes (Signed)
[facility] AT [facility] Provider Note   CSN: [id number] Arrival date & time: 11/08/22  1645     History  Chief Complaint  Patient presents with   Shortness of Breath   HPI Bradley Rice is a 62 y.o. male with pulmonary fibrosis and ALS presenting for shortness of breath.  Symptoms started on Friday.  She had a really bad cough and fever at home and became short of breath.  Usually has a 2.5 L oxygen requirement at home but has had to increase to 4 L and sats have been in the mid 80s on 4 L.  Cough is productive and persistent.  States he coughed so much today that he almost felt like he had to "pass out".  Denies chest pain with states that he feels like his lungs hurt when he breathes in and out.  States he was started on azithromycin by urgent care on Friday and went back because his symptoms persisted on Saturday was switched to doxycycline.   Shortness of Breath      Home Medications Prior to Admission medications   Medication Sig Start Date End Date Taking? Authorizing Provider  albuterol (PROVENTIL) (2.5 MG/3ML) 0.083% nebulizer solution Take 3 mLs (2.5 mg total) by nebulization every 6 (six) hours as needed for wheezing or shortness of breath. 11/03/20  Yes Hawks, Christy A, FNP  albuterol (VENTOLIN HFA) 108 (90 Base) MCG/ACT inhaler Inhale 2 puffs into the lungs every 6 (six) hours as needed for wheezing or shortness of breath. 11/03/20  Yes Hawks, Christy A, FNP  busPIRone (BUSPAR) 5 MG tablet Take 1 tablet (5 mg total) by mouth 3 (three) times daily. 08/31/21  Yes Hawks, Christy A, FNP  DULoxetine (CYMBALTA) 60 MG capsule Take 1 capsule (60 mg total) by mouth daily. 09/01/22  Yes Hawks, Edilia Bo, FNP  Ensure Max Protein (ENSURE MAX PROTEIN) LIQD Take 330 mLs by mouth 3 (three) times daily. 02/10/22  Yes Gwenlyn Fudge, FNP  fexofenadine (ALLEGRA) 180 MG tablet Take 1 tablet (180 mg total) by mouth daily. 02/28/22  Yes Hawks, Christy A, FNP  ibuprofen  (IBU) 600 MG tablet TAKE ONE TABLET BY MOUTH EVERY 8 HOURS AS NEEDED 10/06/22  Yes Hawks, Christy A, FNP  methocarbamol (ROBAXIN) 500 MG tablet Take 1 tablet (500 mg total) by mouth 2 (two) times daily as needed for muscle spasms. 02/28/22  Yes Hawks, Christy A, FNP  metoprolol tartrate (LOPRESSOR) 50 MG tablet Take 1 tablet (50 mg total) by mouth 2 (two) times daily. 09/01/22  Yes Hawks, Christy A, FNP  naloxone [facility]) nasal spray 4 mg/0.1 mL Place 1 spray into the nose as needed (opioid overdose).   Yes [provider]  Nintedanib (OFEV) 150 MG CAPS Take 1 capsule (150 mg total) by mouth 2 (two) times daily. 10/02/20  Yes Kalman Shan, MD  nortriptyline (PAMELOR) 50 MG capsule Take 50 mg by mouth 2 (two) times daily. 11/28/16  Yes [provider]  oxyCODONE (ROXICODONE) 15 MG immediate release tablet Take 15 mg by mouth 4 (four) times daily. 06/22/18  Yes [provider]  oxyCODONE-acetaminophen (PERCOCET) 10-325 MG tablet Take 1 tablet by mouth 3 (three) times daily as needed. 02/28/22  Yes Hawks, Christy A, FNP  sildenafil (REVATIO) 20 MG tablet Take by mouth. 11/28/16  Yes [provider]  topiramate (TOPAMAX) 50 MG tablet Take by mouth. 11/28/16  Yes [provider]  traZODone (DESYREL) 50 MG tablet Take 1 tablet (  50 mg total) by mouth at bedtime. Patient taking differently: Take 100 mg by mouth at bedtime. 09/01/22  Yes Hawks, Edilia Bo, FNP  blood glucose meter kit and supplies Dispense based on patient and insurance preference. Use up to four times daily as directed. (FOR ICD-10 E10.9, E11.9). 05/18/21   [name], FNP      Allergies    Pirfenidone    Review of Systems   Review of Systems  Respiratory:  Positive for shortness of breath.     Physical Exam   Vitals:   11/10/22 2100 11/11/22 0440  BP: 100/64 107/82  Pulse: 93 87  Resp: 18 18  Temp: 98.4 F (36.9 C) 97.7 F (36.5 C)  SpO2: 96% 96%    CONSTITUTIONAL:  ill-appearing,  NAD NEURO:  Alert and oriented x 3, CN 3-12 grossly intact EYES:  eyes equal and reactive ENT/NECK:  Supple, no stridor  CARDIO:  tachycardic and regular rhythm, appears well-perfused  PULM: on 6L [place], tachypnea, rales heard predominantly in left middle lobe. No wheezing or rhonchi. GI/GU:  non-distended, soft MSK/SPINE:  No gross deformities, no edema, moves all extremities  SKIN:  no rash, atraumatic  *Additional and/or pertinent findings included in MDM below   ED Results / Procedures / Treatments   Labs (all labs ordered are listed, but only abnormal results are displayed) Labs Reviewed  COMPREHENSIVE METABOLIC PANEL - Abnormal; Notable for the following components:      Result Value   Glucose, Bld 111 (*)    Calcium 8.7 (*)    Albumin 2.6 (*)    All other components within normal limits  CBC WITH DIFFERENTIAL/PLATELET - Abnormal; Notable for the following components:   WBC 16.4 (*)    RBC 3.94 (*)    Hemoglobin 11.7 (*)    HCT 37.2 (*)    Neutro Abs 14.0 (*)    Monocytes Absolute 1.2 (*)    Abs Immature Granulocytes 0.10 (*)    All other components within normal limits  PROTIME-INR - Abnormal; Notable for the following components:   Prothrombin Time 15.9 (*)    INR 1.3 (*)    All other components within normal limits  APTT - Abnormal; Notable for the following components:   aPTT 43 (*)    All other components within normal limits  BLOOD GAS, ARTERIAL - Abnormal; Notable for the following components:   pH, Arterial 7.47 (*)    pO2, Arterial 71 (*)    Bicarbonate 31.3 (*)    Acid-Base Excess 6.9 (*)    Allens test (pass/fail) BRACHIAL ARTERY (*)    All other components within normal limits  COMPREHENSIVE METABOLIC PANEL - Abnormal; Notable for the following components:   Glucose, Bld 145 (*)    Creatinine, Ser 0.58 (*)    Calcium 8.5 (*)    Albumin 2.5 (*)    All other components within normal limits  CBC - Abnormal; Notable for the following components:   WBC  12.2 (*)    RBC 3.85 (*)    Hemoglobin 11.6 (*)    HCT 36.9 (*)    All other components within normal limits  CBC - Abnormal; Notable for the following components:   WBC 24.8 (*)    RBC 3.77 (*)    Hemoglobin 11.4 (*)    HCT 36.9 (*)    Platelets 411 (*)    All other components within normal limits  CULTURE, BLOOD (ROUTINE X 2)  CULTURE, BLOOD (ROUTINE X 2)  RESP PANEL BY RT-PCR (RSV, FLU A&B, COVID)  RVPGX2  EXPECTORATED SPUTUM ASSESSMENT W GRAM STAIN, RFLX TO RESP C  LACTIC ACID, PLASMA  LACTIC ACID, PLASMA  HIV ANTIBODY (ROUTINE TESTING W REFLEX)  MAGNESIUM  PHOSPHORUS  PROCALCITONIN  URINALYSIS, W/ REFLEX TO CULTURE (INFECTION SUSPECTED)  LEGIONELLA PNEUMOPHILA SEROGP 1 UR AG  STREP PNEUMONIAE URINARY ANTIGEN    EKG EKG Interpretation  Date/Time:  Tuesday November 08 2022 17:07:07 EDT Ventricular Rate:  110 PR Interval:  118 QRS Duration: 98 QT Interval:  356 QTC Calculation: 481 R Axis:   95 Text Interpretation: Sinus tachycardia Possible Left atrial enlargement Rightward axis ST & T wave abnormality, consider inferior ischemia ST & T wave abnormality, consider anterolateral ischemia Abnormal ECG When compared with ECG of 13-Jun-2022 20:30, T wave inversion now evident in Anterior leads Confirmed by Tilden Fossa [phone]) on 11/09/2022 1:01:15 PM  Radiology DG CHEST PORT 1 VIEW  Result Date: 11/10/2022 CLINICAL DATA:  Multifocal pneumonia EXAM: PORTABLE CHEST 1 VIEW COMPARISON:  November 08, 2022. FINDINGS: Persistent interstitial and patchy airspace opacities, including masslike consolidation at the right lung base. Small right pleural effusion. Enlarged cardiac silhouette. IMPRESSION: 1. Persistent interstitial and patchy airspace opacities, including masslike consolidation at the right lung base that was better characterized on recent CT chest. 2. Small right pleural effusion. 3. Cardiomegaly. Electronically Signed   By: Feliberto Harts M.D.   On: 11/10/2022 10:19     Procedures .Critical Care  Performed by: Gareth Eagle, PA-C Authorized by: Gareth Eagle, PA-C   Critical care provider statement:    Critical care time (minutes):  30   Critical care was necessary to treat or prevent imminent or life-threatening deterioration of the following conditions:  Respiratory failure and sepsis   Critical care was time spent personally by me on the following activities:  Development of treatment plan with patient or surrogate, discussions with consultants, evaluation of patient's response to treatment, examination of patient, ordering and review of laboratory studies, ordering and review of radiographic studies, ordering and performing treatments and interventions, pulse oximetry, re-evaluation of patient's condition and review of old charts     Medications Ordered in ED Medications  enoxaparin (LOVENOX) injection 40 mg (40 mg Subcutaneous Patient Refused/Not Given 11/10/22 0853)  acetaminophen (TYLENOL) tablet 650 mg (650 mg Oral Given 11/09/22 0214)    Or  acetaminophen (TYLENOL) suppository 650 mg ( Rectal See Alternative 11/09/22 0214)  ondansetron (ZOFRAN) tablet 4 mg (has no administration in time range)    Or  ondansetron (ZOFRAN) injection 4 mg (has no administration in time range)  dextromethorphan-guaiFENesin (MUCINEX DM) 30-600 MG per 12 hr tablet 1 tablet (1 tablet Oral Given 11/10/22 2113)  cefTRIAXone (ROCEPHIN) 1 g in sodium chloride 0.9 % 100 mL IVPB (1 g Intravenous New Bag/Given 11/10/22 1642)  azithromycin (ZITHROMAX) 500 mg in sodium chloride 0.9 % 250 mL IVPB (500 mg Intravenous New Bag/Given 11/10/22 1733)  feeding supplement (ENSURE ENLIVE / ENSURE PLUS) liquid 237 mL (237 mLs Oral Given 11/10/22 1418)  albuterol (PROVENTIL) (2.5 MG/3ML) 0.083% nebulizer solution 2.5 mg (has no administration in time range)  Nintedanib (OFEV) CAPS 150 mg (150 mg Oral Provided for home use 11/10/22 2119)  methylPREDNISolone sodium succinate (SOLU-MEDROL)  40 mg/mL injection 40 mg (40 mg Intravenous Given 11/10/22 2114)  protein supplement (ENSURE MAX) liquid (330 mLs Oral Given 11/10/22 2153)  busPIRone (BUSPAR) tablet 5 mg (5 mg Oral Given 11/10/22 2113)  DULoxetine (CYMBALTA) DR capsule 60 mg (  60 mg Oral Given 11/10/22 0844)  nortriptyline (PAMELOR) capsule 50 mg (50 mg Oral Given 11/10/22 2113)  methocarbamol (ROBAXIN) tablet 500 mg (has no administration in time range)  metoprolol tartrate (LOPRESSOR) tablet 25 mg (25 mg Oral Given 11/10/22 2113)  traZODone (DESYREL) tablet 100 mg (has no administration in time range)  topiramate (TOPAMAX) tablet 50 mg (50 mg Oral Given 11/10/22 0844)  oxyCODONE-acetaminophen (PERCOCET/ROXICET) 5-325 MG per tablet 1 tablet (1 tablet Oral Given 11/10/22 0056)    And  oxyCODONE (Oxy IR/ROXICODONE) immediate release tablet 5 mg (5 mg Oral Given 11/10/22 1646)  HYDROmorphone (DILAUDID) injection 0.5 mg (0.5 mg Intravenous Given 11/11/22 0527)  cefTRIAXone (ROCEPHIN) 1 g in sodium chloride 0.9 % 100 mL IVPB (0 g Intravenous Stopped 11/08/22 1933)  azithromycin (ZITHROMAX) 500 mg in sodium chloride 0.9 % 250 mL IVPB (0 mg Intravenous Stopped 11/08/22 2007)  ipratropium-albuterol (DUONEB) 0.5-2.5 (3) MG/3ML nebulizer solution 3 mL (3 mLs Nebulization Given 11/08/22 1821)  methylPREDNISolone sodium succinate (SOLU-MEDROL) 125 mg/2 mL injection 125 mg (125 mg Intravenous Given 11/08/22 1908)  sodium chloride 0.9 % bolus 1,000 mL (0 mLs Intravenous Stopped 11/08/22 2149)  iohexol (OMNIPAQUE) 350 MG/ML injection 100 mL (100 mLs Intravenous Contrast Given 11/08/22 2218)    ED Course/ Medical Decision Making/ A&P                             Medical Decision Making Amount and/or Complexity of Data Reviewed Radiology: ordered.  Risk Prescription drug management. Decision regarding hospitalization.   Initial Impression and Ddx [age] year old male who is ill-appearing, hypoxic presenting for cough and increased oxygen requirement.  Exam  remarkable for hypoxia on 6 L nasal cannula, rales and tachypnea.  DDx includes pneumonia, sepsis, COPD exacerbation, CHF, and PE and ACS. Patient PMH that increases complexity of ED encounter: Pulmonary fibrosis with oxygen requirement  Interpretation of Diagnostics I independent reviewed and interpreted the labs as followed: Leukocytosis  - I independently visualized the following imaging with scope of interpretation limited to determining acute life threatening conditions related to emergency care: Chest x-ray, which revealed concerns for multifocal pneumonia  -I personally reviewed and interpreted EKG which revealed sinus tachycardia Patient Reassessment and Ultimate Disposition/Management Overall concern was respiratory failure secondary to pneumonia and possible sepsis.  Treated with IV antibiotics.  Also treated with DuoNeb.  Patient stated that he did feel better from respiratory standpoint after treatment.  Also was noted to have improved respiratory rate and O2 sats.  X-ray findings were consistent reveal concern for multiple focal pneumonia.  Also given his hypoxia and tachycardia, considering PE with CT angio of the chest.  Admitted to hospital service with Dr. Thomes DinningAdefeso who will follow-up on the CT scan, evaluate for possible sepsis.  Patient management required discussion with the following services or consulting groups:  Hospitalist Service  Complexity of Problems Addressed Acute complicated illness or Injury  Additional Data Reviewed and Analyzed Further history obtained from: Further history from spouse/family member, Past medical history and medications listed in the EMR, Prior ED visit notes, and Recent discharge summary  Patient Encounter Risk Assessment Consideration of hospitalization         Final Clinical Impression(s) / ED Diagnoses Final diagnoses:  Hypoxia    Rx / DC Orders ED Discharge Orders     None         Gareth EagleRobinson, Blessyn [place] K, PA-C 11/11/22  0804    Gloris Manchesterixon, Ryan, MD 11/13/22 0105

## 2022-11-08 NOTE — ED Triage Notes (Signed)
Patient with ALS, pulm fibrosis and wears typically 2.5L [place] but on 4L and sats only 85-86%  Reports he coughs so hard he is about to pass out.  Reports his lungs hurt especially when he breathes in and out.

## 2022-11-08 NOTE — ED Notes (Signed)
Pt made aware that a urine sample was needed.  

## 2022-11-08 NOTE — H&P (Incomplete)
History and Physical    Patient: Bradley Rice WGN:[id number] DOB: 1960/09/18 DOA: 11/08/2022 DOS: the patient was seen and examined on 11/08/2022 PCP: Bradley Spencer, FNP  Patient coming from: Home  Chief Complaint:  Chief Complaint  Patient presents with  . Shortness of Breath   HPI: Bradley Rice is a 62 y.o. male with medical history significant of asthma/COPD, pulmonary fibrosis on 2.5 L of oxygen at home, ALS who presents to the emergency department due to to 4 day onset of productive cough, fever and worsening shortness of breath at home.  Patient has had to increase home oxygen from 2.5 to 4 L with O2 sat still being in the mid 80s.  Patient states that the cough today was so much that he felt like he was going to pass out, he states that his chest hurts when he breathes in and out.  He went to urgent care on Friday (4/5) and was started on azithromycin, but returned to the urgent care on Saturday (4/6) due to persistence of symptoms and antibiotic was switched to doxycycline.  ED Course:  In the emergency department, he was tachypneic and tachycardic, BP was 160/76, temperature 99.1, O2 sat was 88% on supplemental oxygen at 4 PM, this was increased to 6 LPM with O2 sat at 95%.  ABG done showed hypoxia.  Influenza A, B, SARS coronavirus 2, RSV was negative, blood culture was pending.  Workup in the ED showed normocytic anemia, WBC of 16.4, MCV 94.4, platelets 385.  BMP was normal except for blood glucose of 111, albumin 2.6, lactic acid x 2 was normal Chest x-ray showed: 1. Airspace opacities noted bilaterally, new since the prior chest radiographs and CT from 06/13/2022. Multifocal pneumonia suspected. 2. Possible right mid to lower lung mass. Recommend follow-up chest CT with contrast on a nonemergent basis for further assessment. 3. Underlying pulmonary fibrosis. CT angiography chest with contrast: 1. No evidence of pulmonary embolism. 2. Patchy airspace disease in the lungs bilaterally  with masslike consolidation in the right lower lobe, which may be infectious or inflammatory. The possibility of underlying neoplasm can not be excluded. A short-term follow-up is recommended until resolution. 3. Moderate likely loculated pleural effusion on the right. 4. Findings compatible with pulmonary fibrosis. 5. Cardiomegaly with small pericardial effusion and coronary artery calcifications. 6. Distended pulmonary trunk, suggesting underlying pulmonary artery hypertension. 7. Aortic atherosclerosis. He was treated with IV ceftriaxone and azithromycin, breathing treatment with DuoNeb was provided Solu-Medrol 125 mg x 1 was given, IV hydration with 1 L NS was provided.  Hospitalist was asked to admit patient for further evaluation and management.  Review of Systems: Review of systems as noted in the HPI. All other systems reviewed and are negative.   Past Medical History:  Diagnosis Date  . ALS (amyotrophic lateral sclerosis)   . Asthma   . Back pain   . COPD (chronic obstructive pulmonary disease)   . Coronary artery calcification seen on CT scan   . DDD (degenerative disc disease), lumbar   . Depression   . Dysphagia    Following previous surgery  . History of blood transfusion 2014   Post-op bleed  . History of lung cancer   . History of stroke 2015  . Idiopathic pulmonary fibrosis   . Migraine   . Spider bite 2012   Past Surgical History:  Procedure Laterality Date  . ANTERIOR CERVICAL DECOMP/DISCECTOMY FUSION  12/22/2011   Procedure: ANTERIOR CERVICAL DECOMPRESSION/DISCECTOMY FUSION 3 LEVELS;  Surgeon: Venita Lick, MD;  Location: [facility] OR;  Service: Orthopedics;  Laterality: N/A;  ACDF C5-T1  . ANTERIOR CERVICAL DECOMP/DISCECTOMY FUSION  06/25/2012   Procedure: ANTERIOR CERVICAL DECOMPRESSION/DISCECTOMY FUSION 1 LEVEL/HARDWARE REMOVAL;  Surgeon: Kerrin Champagne, MD;  Location: [facility] OR;  Service: Orthopedics;  Laterality: N/A;  Removal anterior cervical plate Z6-X0, Explore  Fusion, Left C5-6, C6-7, C7-T1 Re-do Foraminotomy, Left open carpal tunnel release with release of ulnar nerve at [facility], Posterior Cervical Fusion with lateral mass screw  . ANTERIOR CERVICAL DECOMP/DISCECTOMY FUSION N/A 03/25/2021   Procedure: ANTERIOR CERVICAL DECOMPRESSION/DISCECTOMY FUSION 2 LEVELS  C3-5;  Surgeon: Venita Lick, MD;  Location: [facility] OR;  Service: Orthopedics;  Laterality: N/A;  ANTERIOR CERVICAL DECOMPRESSION/DISCECTOMY FUSION 2 LEVELS  C3-5  . BACK SURGERY    . CARDIAC CATHETERIZATION    . CARPAL TUNNEL RELEASE  06/25/2012   Procedure: CARPAL TUNNEL RELEASE;  Surgeon: Kerrin Champagne, MD;  Location: [facility] OR;  Service: Orthopedics;  Laterality: Left;  as above  . CHOLECYSTECTOMY  2009  . CORONARY ANGIOPLASTY  2001   [facility]  . LUMBAR FUSION  2000  . POSTERIOR CERVICAL FUSION/FORAMINOTOMY  06/25/2012   Procedure: POSTERIOR CERVICAL FUSION/FORAMINOTOMY LEVEL 1;  Surgeon: Kerrin Champagne, MD;  Location: [facility] OR;  Service: Orthopedics;  Laterality: N/A;  as above  . POSTERIOR CERVICAL FUSION/FORAMINOTOMY N/A 12/17/2013   Procedure: REMOVAL OF POSTERIOR CERVICAL FUSION LATERAL MASS SCREWS AND RODS C5-T1, EXPLORE FUSION, LEFT SIX-SEVEN FORAMINOTOMY;  Surgeon: Kerrin Champagne, MD;  Location: [facility] OR;  Service: Orthopedics;  Laterality: N/A;  . VASECTOMY  1990    Social History:  reports that he quit smoking about 7 years ago. His smoking use included cigarettes. He has a 30.00 pack-year smoking history. He has never used smokeless tobacco. He reports that he does not drink alcohol and does not use drugs.   Allergies  Allergen Reactions  . Pirfenidone Nausea And Vomiting and Other (See Comments)    Weight loss    Family History  Problem Relation Age of Onset  . ALS Mother   . Cancer Mother   . Cancer Sister   . ALS Sister   . Cancer Brother        1 brother  . Anesthesia problems Neg Hx     ***  Prior to Admission medications   Medication Sig Start Date End Date  Taking? Authorizing Provider  albuterol (PROVENTIL) (2.5 MG/3ML) 0.083% nebulizer solution Take 3 mLs (2.5 mg total) by nebulization every 6 (six) hours as needed for wheezing or shortness of breath. 11/03/20  Yes Hawks, Christy A, FNP  albuterol (VENTOLIN HFA) 108 (90 Base) MCG/ACT inhaler Inhale 2 puffs into the lungs every 6 (six) hours as needed for wheezing or shortness of breath. 11/03/20  Yes Hawks, Christy A, FNP  busPIRone (BUSPAR) 5 MG tablet Take 1 tablet (5 mg total) by mouth 3 (three) times daily. 08/31/21  Yes Hawks, Christy A, FNP  DULoxetine (CYMBALTA) 60 MG capsule Take 1 capsule (60 mg total) by mouth daily. 09/01/22  Yes Hawks, Edilia Bo, FNP  Ensure Max Protein (ENSURE MAX PROTEIN) LIQD Take 330 mLs by mouth 3 (three) times daily. 02/10/22  Yes Gwenlyn Fudge, FNP  fexofenadine (ALLEGRA) 180 MG tablet Take 1 tablet (180 mg total) by mouth daily. 02/28/22  Yes Hawks, Christy A, FNP  ibuprofen (IBU) 600 MG tablet TAKE ONE TABLET BY MOUTH EVERY 8 HOURS AS NEEDED 10/06/22  Yes Bradley Spencer, FNP  methocarbamol (ROBAXIN) 500 MG tablet Take 1 tablet (500 mg total) by mouth 2 (two) times daily as needed for muscle spasms. 02/28/22  Yes Hawks, Christy A, FNP  metoprolol tartrate (LOPRESSOR) 50 MG tablet Take 1 tablet (50 mg total) by mouth 2 (two) times daily. 09/01/22  Yes Hawks, Christy A, FNP  naloxone [facility]) nasal spray 4 mg/0.1 mL Place 1 spray into the nose as needed (opioid overdose).   Yes [provider]  Nintedanib (OFEV) 150 MG CAPS Take 1 capsule (150 mg total) by mouth 2 (two) times daily. 10/02/20  Yes Kalman Shan, MD  nortriptyline (PAMELOR) 50 MG capsule Take 50 mg by mouth 2 (two) times daily. 11/28/16  Yes [provider]  oxyCODONE (ROXICODONE) 15 MG immediate release tablet Take 15 mg by mouth 4 (four) times daily. 06/22/18  Yes [provider]  oxyCODONE-acetaminophen (PERCOCET) 10-325 MG tablet Take 1 tablet by mouth 3 (three) times daily as  needed. 02/28/22  Yes Hawks, Christy A, FNP  sildenafil (REVATIO) 20 MG tablet Take by mouth. 11/28/16  Yes [provider]  topiramate (TOPAMAX) 50 MG tablet Take by mouth. 11/28/16  Yes [provider]  traZODone (DESYREL) 50 MG tablet Take 1 tablet (50 mg total) by mouth at bedtime. Patient taking differently: Take 100 mg by mouth at bedtime. 09/01/22  Yes Hawks, Edilia Bo, FNP  blood glucose meter kit and supplies Dispense based on patient and insurance preference. Use up to four times daily as directed. (FOR ICD-10 E10.9, E11.9). 05/18/21   Bradley Spencer, FNP    Physical Exam: BP 109/71   Pulse 98   Temp 98 F (36.7 C)   Resp 20   Ht 6\' 1"  (1.854 m)   Wt 68.5 kg   SpO2 91%   BMI 19.92 kg/m   General: 62 y.o. year-old male well developed well nourished in no acute distress.  Alert and oriented x3. HEENT: NCAT, EOMI Neck: Supple, trachea medial Cardiovascular: Regular rate and rhythm with no rubs or gallops.  No thyromegaly or JVD noted.  No lower extremity edema. 2/4 pulses in all 4 extremities. Respiratory: Clear to auscultation with no wheezes or rales. Good inspiratory effort. Abdomen: Soft, nontender nondistended with normal bowel sounds x4 quadrants. Muskuloskeletal: No cyanosis, clubbing or edema noted bilaterally Neuro: CN II-XII intact, strength 5/5 x 4, sensation, reflexes intact Skin: No ulcerative lesions noted or rashes Psychiatry: Judgement and insight appear normal. Mood is appropriate for condition and setting          Labs on Admission:  Basic Metabolic Panel: Recent Labs  Lab 11/08/22 1845  NA 138  K 3.5  CL 102  CO2 27  GLUCOSE 111*  BUN 9  CREATININE 0.69  CALCIUM 8.7*   Liver Function Tests: Recent Labs  Lab 11/08/22 1845  AST 23  ALT 31  ALKPHOS 110  BILITOT 1.0  PROT 7.0  ALBUMIN 2.6*   No results for input(s): "LIPASE", "AMYLASE" in the last 168 hours. No results for input(s): "AMMONIA" in the last 168  hours. CBC: Recent Labs  Lab 11/08/22 1845  WBC 16.4*  NEUTROABS 14.0*  HGB 11.7*  HCT 37.2*  MCV 94.4  PLT 385   Cardiac Enzymes: No results for input(s): "CKTOTAL", "CKMB", "CKMBINDEX", "TROPONINI" in the last 168 hours.  BNP (last 3 results) No results for input(s): "BNP" in the last 8760 hours.  ProBNP (last 3 results) No results for input(s): "PROBNP" in the last 8760 hours.  CBG: No  results for input(s): "GLUCAP" in the last 168 hours.  Radiological Exams on Admission: CT Angio Chest PE W/Cm &/Or Wo Cm  Result Date: 11/08/2022 CLINICAL DATA:  Pulmonary embolism suspected, high probability. Pulmonary fibrosis. EXAM: CT ANGIOGRAPHY CHEST WITH CONTRAST TECHNIQUE: Multidetector CT imaging of the chest was performed using the standard protocol during bolus administration of intravenous contrast. Multiplanar CT image reconstructions and MIPs were obtained to evaluate the vascular anatomy. RADIATION DOSE REDUCTION: This exam was performed according to the departmental dose-optimization program which includes automated exposure control, adjustment of the mA and/or kV according to patient size and/or use of iterative reconstruction technique. CONTRAST:  100mL OMNIPAQUE IOHEXOL 350 MG/ML SOLN COMPARISON:  06/13/2022. FINDINGS: Cardiovascular: The heart is enlarged and there is a small pericardial effusion. Scattered coronary artery calcifications are noted. There is atherosclerotic calcification of the aorta without evidence of aneurysm. The pulmonary trunk is mildly distended suggesting underlying pulmonary artery hypertension. No evidence of pulmonary embolism. Mediastinum/Nodes: Multiple enlarged lymph nodes are present in the mediastinum measuring up to 2.6 cm in the right paratracheal space and 2.1 cm in the subcarinal space, which are likely reactive. Enlarged lymph nodes are present in the right hilum. No axillary lymphadenopathy. The trachea and esophagus are within normal limits.  Lungs/Pleura: There is a moderate slightly loculated loculated pleural effusion on the right. No pneumothorax bilaterally. Patchy airspace disease is noted in the lungs bilaterally. There is a irregular masslike consolidation in the right lower lobe. Diffuse ground-glass opacities, bronchiectasis, and honeycombing are noted bilaterally, compatible with known pulmonary fibrosis. Upper Abdomen: No acute abnormality. Musculoskeletal: No acute osseous abnormality. Review of the MIP images confirms the above findings. IMPRESSION: 1. No evidence of pulmonary embolism. 2. Patchy airspace disease in the lungs bilaterally with masslike consolidation in the right lower lobe, which may be infectious or inflammatory. The possibility of underlying neoplasm can not be excluded. A short-term follow-up is recommended until resolution. 3. Moderate likely loculated pleural effusion on the right. 4. Findings compatible with pulmonary fibrosis. 5. Cardiomegaly with small pericardial effusion and coronary artery calcifications. 6. Distended pulmonary trunk, suggesting underlying pulmonary artery hypertension. 7. Aortic atherosclerosis. Electronically Signed   By: Thornell SartoriusLaura  Taylor M.D.   On: 11/08/2022 22:53   DG Chest Port 1 View  Result Date: 11/08/2022 CLINICAL DATA:  Questionable sepsis. History of fibrosis. Typically patient patient typically on home oxygen. EXAM: PORTABLE CHEST 1 VIEW COMPARISON:  06/13/2022. FINDINGS: Patchy bilateral airspace opacities, left perihilar mid lung and both lower lungs, new since the prior exam. There is underlying interstitial thickening/fibrosis that is unchanged. Rounded opacity adjacent to the inferior right hilum, up proximally 4.3 cm in size, suspicious for a mass, and corresponding to a focal opacity noted in the superior segment of the right lower lobe on the prior chest CT from 06/13/2022. No convincing pleural effusion and no pneumothorax. Cardiac silhouette is normal in size. No convincing  mediastinal mass. Skeletal structures are grossly intact. IMPRESSION: 1. Airspace opacities noted bilaterally, new since the prior chest radiographs and CT from 06/13/2022. Multifocal pneumonia suspected. 2. Possible right mid to lower lung mass. Recommend follow-up chest CT with contrast on a nonemergent basis for further assessment. 3. Underlying pulmonary fibrosis. Electronically Signed   By: Amie Portlandavid  Ormond M.D.   On: 11/08/2022 18:01    EKG: I independently viewed the EKG done and my findings are as followed: Sinus tachycardia at a rate of 110 bpm  Assessment/Plan Present on Admission: . CAP (community acquired pneumonia)  Principal  Problem:   CAP (community acquired pneumonia)  Acute on chronic respiratory failure in the setting of CAP POA Hypoalbuminemia possibly secondary to moderate protein calorie malnutrition Idiopathic pulmonary fibrosis ALS    DVT prophylaxis: ***   Code Status: ***   Family Communication: ***   Disposition Plan: ***   Consults called: ***   Admission status: ***     Frankey Shown MD [facility] Pager [phone]  If 7PM-7AM, please contact night-coverage [email or web address] Password [facility]  11/08/2022, 11:36 PM        Review of Systems: {ROS_Text:26778} Past Medical History:  Diagnosis Date  . ALS (amyotrophic lateral sclerosis)   . Asthma   . Back pain   . COPD (chronic obstructive pulmonary disease)   . Coronary artery calcification seen on CT scan   . DDD (degenerative disc disease), lumbar   . Depression   . Dysphagia    Following previous surgery  . History of blood transfusion 2014   Post-op bleed  . History of lung cancer   . History of stroke 2015  . Idiopathic pulmonary fibrosis   . Migraine   . Spider bite 2012   Past Surgical History:  Procedure Laterality Date  . ANTERIOR CERVICAL DECOMP/DISCECTOMY FUSION  12/22/2011   Procedure: ANTERIOR CERVICAL DECOMPRESSION/DISCECTOMY FUSION 3 LEVELS;  Surgeon: Venita Lick, MD;  Location: [facility] OR;  Service: Orthopedics;  Laterality: N/A;  ACDF C5-T1  . ANTERIOR CERVICAL DECOMP/DISCECTOMY FUSION  06/25/2012   Procedure: ANTERIOR CERVICAL DECOMPRESSION/DISCECTOMY FUSION 1 LEVEL/HARDWARE REMOVAL;  Surgeon: Kerrin Champagne, MD;  Location: [facility] OR;  Service: Orthopedics;  Laterality: N/A;  Removal anterior cervical plate V7-Q4, Explore Fusion, Left C5-6, C6-7, C7-T1 Re-do Foraminotomy, Left open carpal tunnel release with release of ulnar nerve at [facility], Posterior Cervical Fusion with lateral mass screw  . ANTERIOR CERVICAL DECOMP/DISCECTOMY FUSION N/A 03/25/2021   Procedure: ANTERIOR CERVICAL DECOMPRESSION/DISCECTOMY FUSION 2 LEVELS  C3-5;  Surgeon: Venita Lick, MD;  Location: [facility] OR;  Service: Orthopedics;  Laterality: N/A;  ANTERIOR CERVICAL DECOMPRESSION/DISCECTOMY FUSION 2 LEVELS  C3-5  . BACK SURGERY    . CARDIAC CATHETERIZATION    . CARPAL TUNNEL RELEASE  06/25/2012   Procedure: CARPAL TUNNEL RELEASE;  Surgeon: Kerrin Champagne, MD;  Location: [facility] OR;  Service: Orthopedics;  Laterality: Left;  as above  . CHOLECYSTECTOMY  2009  . CORONARY ANGIOPLASTY  2001   [facility]  . LUMBAR FUSION  2000  . POSTERIOR CERVICAL FUSION/FORAMINOTOMY  06/25/2012   Procedure: POSTERIOR CERVICAL FUSION/FORAMINOTOMY LEVEL 1;  Surgeon: Kerrin Champagne, MD;  Location: [facility] OR;  Service: Orthopedics;  Laterality: N/A;  as above  . POSTERIOR CERVICAL FUSION/FORAMINOTOMY N/A 12/17/2013   Procedure: REMOVAL OF POSTERIOR CERVICAL FUSION LATERAL MASS SCREWS AND RODS C5-T1, EXPLORE FUSION, LEFT SIX-SEVEN FORAMINOTOMY;  Surgeon: Kerrin Champagne, MD;  Location: [facility] OR;  Service: Orthopedics;  Laterality: N/A;  . VASECTOMY  1990   Social History:  reports that he quit smoking about 7 years ago. His smoking use included cigarettes. He has a 30.00 pack-year smoking history. He has never used smokeless tobacco. He reports that he does not drink alcohol and does not use drugs.  Allergies  Allergen  Reactions  . Pirfenidone Nausea And Vomiting and Other (See Comments)    Weight loss    Family History  Problem Relation Age of Onset  . ALS Mother   . Cancer Mother   . Cancer Sister   . ALS Sister   . Cancer Brother  1 brother  . Anesthesia problems Neg Hx     Prior to Admission medications   Medication Sig Start Date End Date Taking? Authorizing Provider  albuterol (PROVENTIL) (2.5 MG/3ML) 0.083% nebulizer solution Take 3 mLs (2.5 mg total) by nebulization every 6 (six) hours as needed for wheezing or shortness of breath. 11/03/20  Yes Hawks, Christy A, FNP  albuterol (VENTOLIN HFA) 108 (90 Base) MCG/ACT inhaler Inhale 2 puffs into the lungs every 6 (six) hours as needed for wheezing or shortness of breath. 11/03/20  Yes Hawks, Christy A, FNP  busPIRone (BUSPAR) 5 MG tablet Take 1 tablet (5 mg total) by mouth 3 (three) times daily. 08/31/21  Yes Hawks, Christy A, FNP  DULoxetine (CYMBALTA) 60 MG capsule Take 1 capsule (60 mg total) by mouth daily. 09/01/22  Yes Hawks, Edilia Bo, FNP  Ensure Max Protein (ENSURE MAX PROTEIN) LIQD Take 330 mLs by mouth 3 (three) times daily. 02/10/22  Yes Gwenlyn Fudge, FNP  fexofenadine (ALLEGRA) 180 MG tablet Take 1 tablet (180 mg total) by mouth daily. 02/28/22  Yes Hawks, Christy A, FNP  ibuprofen (IBU) 600 MG tablet TAKE ONE TABLET BY MOUTH EVERY 8 HOURS AS NEEDED 10/06/22  Yes Hawks, Christy A, FNP  methocarbamol (ROBAXIN) 500 MG tablet Take 1 tablet (500 mg total) by mouth 2 (two) times daily as needed for muscle spasms. 02/28/22  Yes Hawks, Christy A, FNP  metoprolol tartrate (LOPRESSOR) 50 MG tablet Take 1 tablet (50 mg total) by mouth 2 (two) times daily. 09/01/22  Yes Hawks, Christy A, FNP  naloxone [facility]) nasal spray 4 mg/0.1 mL Place 1 spray into the nose as needed (opioid overdose).   Yes [provider]  Nintedanib (OFEV) 150 MG CAPS Take 1 capsule (150 mg total) by mouth 2 (two) times daily. 10/02/20  Yes Kalman Shan, MD   nortriptyline (PAMELOR) 50 MG capsule Take 50 mg by mouth 2 (two) times daily. 11/28/16  Yes [provider]  oxyCODONE (ROXICODONE) 15 MG immediate release tablet Take 15 mg by mouth 4 (four) times daily. 06/22/18  Yes [provider]  oxyCODONE-acetaminophen (PERCOCET) 10-325 MG tablet Take 1 tablet by mouth 3 (three) times daily as needed. 02/28/22  Yes Hawks, Christy A, FNP  sildenafil (REVATIO) 20 MG tablet Take by mouth. 11/28/16  Yes [provider]  topiramate (TOPAMAX) 50 MG tablet Take by mouth. 11/28/16  Yes [provider]  traZODone (DESYREL) 50 MG tablet Take 1 tablet (50 mg total) by mouth at bedtime. Patient taking differently: Take 100 mg by mouth at bedtime. 09/01/22  Yes Hawks, Edilia Bo, FNP  blood glucose meter kit and supplies Dispense based on patient and insurance preference. Use up to four times daily as directed. (FOR ICD-10 E10.9, E11.9). 05/18/21   Bradley Spencer, FNP    Physical Exam: Vitals:   11/08/22 1651 11/08/22 1822 11/08/22 2000 11/08/22 2030  BP: 116/76  99/67 105/70  Pulse: (!) 110  94 92  Resp: (!) 22  (!) 21 (!) 22  Temp: 99.1 F (37.3 C)     TempSrc: Oral     SpO2: (!) 88% 95% 95% 95%  Weight:      Height:       *** Data Reviewed: {Tip this will not be part of the note when signed- Document your independent interpretation of telemetry tracing, EKG, lab, Radiology test or any other diagnostic tests. Add any new diagnostic test ordered today. (Optional):26781} {Results:26384}  Assessment and Plan: No notes  have been filed under this hospital service. Service: Hospitalist     Advance Care Planning:   Code Status: Prior ***  Consults: ***  Family Communication: ***  Severity of Illness: {Observation/Inpatient:21159}  Author: Frankey Shown, DO 11/08/2022 11:02 PM  For on call review [email or web address].

## 2022-11-08 NOTE — H&P (Incomplete)
History and Physical    Patient: Bradley Rice NAT:[id number] DOB: August 28, 1960 DOA: 11/08/2022 DOS: the patient was seen and examined on 11/08/2022 PCP: [name], FNP  Patient coming from: {Point_of_Origin:[address]777}  Chief Complaint:  Chief Complaint  Patient presents with   Shortness of Breath   HPI: Bradley Rice is a 62 y.o. male with medical history significant of ***  Review of Systems: {ROS_Text:26778} Past Medical History:  Diagnosis Date   ALS (amyotrophic lateral sclerosis)    Asthma    Back pain    COPD (chronic obstructive pulmonary disease)    Coronary artery calcification seen on CT scan    DDD (degenerative disc disease), lumbar    Depression    Dysphagia    Following previous surgery   History of blood transfusion 2014   Post-op bleed   History of lung cancer    History of stroke 2015   Idiopathic pulmonary fibrosis    Migraine    Spider bite 2012   Past Surgical History:  Procedure Laterality Date   ANTERIOR CERVICAL DECOMP/DISCECTOMY FUSION  12/22/2011   Procedure: ANTERIOR CERVICAL DECOMPRESSION/DISCECTOMY FUSION 3 LEVELS;  Surgeon: Venita Lick, MD;  Location: [facility];  Service: Orthopedics;  Laterality: N/A;  ACDF C5-T1   ANTERIOR CERVICAL DECOMP/DISCECTOMY FUSION  06/25/2012   Procedure: ANTERIOR CERVICAL DECOMPRESSION/DISCECTOMY FUSION 1 LEVEL/HARDWARE REMOVAL;  Surgeon: Kerrin Champagne, MD;  Location: [facility] OR;  Service: Orthopedics;  Laterality: N/A;  Removal anterior cervical plate K2-H0, Explore Fusion, Left C5-6, C6-7, C7-T1 Re-do Foraminotomy, Left open carpal tunnel release with release of ulnar nerve at [facility], Posterior Cervical Fusion with lateral mass screw   ANTERIOR CERVICAL DECOMP/DISCECTOMY FUSION N/A 03/25/2021   Procedure: ANTERIOR CERVICAL DECOMPRESSION/DISCECTOMY FUSION 2 LEVELS  C3-5;  Surgeon: Venita Lick, MD;  Location: [facility];  Service: Orthopedics;  Laterality: N/A;  ANTERIOR CERVICAL DECOMPRESSION/DISCECTOMY FUSION 2 LEVELS  C3-5    BACK SURGERY     CARDIAC CATHETERIZATION     CARPAL TUNNEL RELEASE  06/25/2012   Procedure: CARPAL TUNNEL RELEASE;  Surgeon: Kerrin Champagne, MD;  Location: [facility];  Service: Orthopedics;  Laterality: Left;  as above   CHOLECYSTECTOMY  2009   CORONARY ANGIOPLASTY  2001   [facility] [place]   LUMBAR FUSION  2000   POSTERIOR CERVICAL FUSION/FORAMINOTOMY  06/25/2012   Procedure: POSTERIOR CERVICAL FUSION/FORAMINOTOMY LEVEL 1;  Surgeon: Kerrin Champagne, MD;  Location: [facility];  Service: Orthopedics;  Laterality: N/A;  as above   POSTERIOR CERVICAL FUSION/FORAMINOTOMY N/A 12/17/2013   Procedure: REMOVAL OF POSTERIOR CERVICAL FUSION LATERAL MASS SCREWS AND RODS C5-T1, EXPLORE FUSION, LEFT SIX-SEVEN FORAMINOTOMY;  Surgeon: Kerrin Champagne, MD;  Location: [facility];  Service: Orthopedics;  Laterality: N/A;   VASECTOMY  1990   Social History:  reports that he quit smoking about 7 years ago. His smoking use included cigarettes. He has a 30.00 pack-year smoking history. He has never used smokeless tobacco. He reports that he does not drink alcohol and does not use drugs.  Allergies  Allergen Reactions   Pirfenidone Nausea And Vomiting and Other (See Comments)    Weight loss    Family History  Problem Relation Age of Onset   ALS Mother    Cancer Mother    Cancer Sister    ALS Sister    Cancer Brother        1 brother   Anesthesia problems Neg Hx     Prior to Admission medications   Medication Sig  Start Date End Date Taking? Authorizing Provider  albuterol (PROVENTIL) (2.5 MG/3ML) 0.083% nebulizer solution Take 3 mLs (2.5 mg total) by nebulization every 6 (six) hours as needed for wheezing or shortness of breath. 11/03/20  Yes Hawks, Christy A, FNP  albuterol (VENTOLIN HFA) 108 (90 Base) MCG/ACT inhaler Inhale 2 puffs into the lungs every 6 (six) hours as needed for wheezing or shortness of breath. 11/03/20  Yes Hawks, Christy A, FNP  busPIRone (BUSPAR) 5 MG tablet Take 1 tablet (5 mg total) by mouth 3 (three)  times daily. 08/31/21  Yes Hawks, Christy A, FNP  DULoxetine (CYMBALTA) 60 MG capsule Take 1 capsule (60 mg total) by mouth daily. 09/01/22  Yes Hawks, Edilia Bohristy A, FNP  Ensure Max Protein (ENSURE MAX PROTEIN) LIQD Take 330 mLs by mouth 3 (three) times daily. 02/10/22  Yes Gwenlyn FudgeJoyce, Britney F, FNP  fexofenadine (ALLEGRA) 180 MG tablet Take 1 tablet (180 mg total) by mouth daily. 02/28/22  Yes Hawks, Christy A, FNP  ibuprofen (IBU) 600 MG tablet TAKE ONE TABLET BY MOUTH EVERY 8 HOURS AS NEEDED 10/06/22  Yes Hawks, Christy A, FNP  methocarbamol (ROBAXIN) 500 MG tablet Take 1 tablet (500 mg total) by mouth 2 (two) times daily as needed for muscle spasms. 02/28/22  Yes Hawks, Christy A, FNP  metoprolol tartrate (LOPRESSOR) 50 MG tablet Take 1 tablet (50 mg total) by mouth 2 (two) times daily. 09/01/22  Yes Hawks, Christy A, FNP  naloxone [facility](NARCAN) nasal spray 4 mg/0.1 mL Place 1 spray into the nose as needed (opioid overdose).   Yes [provider]  Nintedanib (OFEV) 150 MG CAPS Take 1 capsule (150 mg total) by mouth 2 (two) times daily. 10/02/20  Yes Kalman Shanamaswamy, Murali, MD  nortriptyline (PAMELOR) 50 MG capsule Take 50 mg by mouth 2 (two) times daily. 11/28/16  Yes [provider]  oxyCODONE (ROXICODONE) 15 MG immediate release tablet Take 15 mg by mouth 4 (four) times daily. 06/22/18  Yes [provider]  oxyCODONE-acetaminophen (PERCOCET) 10-325 MG tablet Take 1 tablet by mouth 3 (three) times daily as needed. 02/28/22  Yes Hawks, Christy A, FNP  sildenafil (REVATIO) 20 MG tablet Take by mouth. 11/28/16  Yes [provider]  topiramate (TOPAMAX) 50 MG tablet Take by mouth. 11/28/16  Yes [provider]  traZODone (DESYREL) 50 MG tablet Take 1 tablet (50 mg total) by mouth at bedtime. Patient taking differently: Take 100 mg by mouth at bedtime. 09/01/22  Yes Hawks, Edilia Bohristy A, FNP  blood glucose meter kit and supplies Dispense based on patient and insurance preference. Use up to four  times daily as directed. (FOR ICD-10 E10.9, E11.9). 05/18/21   Junie SpencerHawks, Christy A, FNP    Physical Exam: Vitals:   11/08/22 1651 11/08/22 1822 11/08/22 2000 11/08/22 2030  BP: 116/76  99/67 105/70  Pulse: (!) 110  94 92  Resp: (!) 22  (!) 21 (!) 22  Temp: 99.1 F (37.3 C)     TempSrc: Oral     SpO2: (!) 88% 95% 95% 95%  Weight:      Height:       *** Data Reviewed: {Tip this will not be part of the note when signed- Document your independent interpretation of telemetry tracing, EKG, lab, Radiology test or any other diagnostic tests. Add any new diagnostic test ordered today. (Optional):26781} {Results:26384}  Assessment and Plan: No notes have been filed under this hospital service. Service: Hospitalist     Advance Care Planning:   Code  Status: Prior ***  Consults: ***  Family Communication: ***  Severity of Illness: {Observation/Inpatient:21159}  Author: Frankey Shown, DO 11/08/2022 11:02 PM  For on call review [email or web address].

## 2022-11-08 NOTE — ED Notes (Signed)
Increased to 6L [place] sats at 93%

## 2022-11-09 DIAGNOSIS — J90 Pleural effusion, not elsewhere classified: Secondary | ICD-10-CM | POA: Insufficient documentation

## 2022-11-09 DIAGNOSIS — J9621 Acute and chronic respiratory failure with hypoxia: Secondary | ICD-10-CM | POA: Insufficient documentation

## 2022-11-09 DIAGNOSIS — E8809 Other disorders of plasma-protein metabolism, not elsewhere classified: Secondary | ICD-10-CM | POA: Insufficient documentation

## 2022-11-09 DIAGNOSIS — G1221 Amyotrophic lateral sclerosis: Secondary | ICD-10-CM | POA: Insufficient documentation

## 2022-11-09 DIAGNOSIS — F419 Anxiety disorder, unspecified: Secondary | ICD-10-CM | POA: Insufficient documentation

## 2022-11-09 DIAGNOSIS — J189 Pneumonia, unspecified organism: Secondary | ICD-10-CM | POA: Diagnosis not present

## 2022-11-09 LAB — COMPREHENSIVE METABOLIC PANEL
ALT: 27 U/L (ref 0–44)
AST: 17 U/L (ref 15–41)
Albumin: 2.5 g/dL — ABNORMAL LOW (ref 3.5–5.0)
Alkaline Phosphatase: 100 U/L (ref 38–126)
Anion gap: 6 (ref 5–15)
BUN: 11 mg/dL (ref 6–20)
CO2: 27 mmol/L (ref 22–32)
Calcium: 8.5 mg/dL — ABNORMAL LOW (ref 8.9–10.3)
Chloride: 105 mmol/L (ref 98–111)
Creatinine, Ser: 0.58 mg/dL — ABNORMAL LOW (ref 0.61–1.24)
GFR, Estimated: >60 mL/min (ref >60)
Glucose, Bld: 145 mg/dL — ABNORMAL HIGH (ref 70–99)
Potassium: 3.5 mmol/L (ref 3.5–5.1)
Sodium: 138 mmol/L (ref 135–145)
Total Bilirubin: 0.6 mg/dL (ref 0.3–1.2)
Total Protein: 6.5 g/dL (ref 6.5–8.1)

## 2022-11-09 LAB — CBC
HCT: 36.9 % — ABNORMAL LOW (ref 39.0–52.0)
Hemoglobin: 11.6 g/dL — ABNORMAL LOW (ref 13.0–17.0)
MCH: 30.1 pg (ref 26.0–34.0)
MCHC: 31.4 g/dL (ref 30.0–36.0)
MCV: 95.8 fL (ref 80.0–100.0)
Platelets: 374 10*3/uL (ref 150–400)
RBC: 3.85 MIL/uL — ABNORMAL LOW (ref 4.22–5.81)
RDW: 13.9 % (ref 11.5–15.5)
WBC: 12.2 10*3/uL — ABNORMAL HIGH (ref 4.0–10.5)
nRBC: 0 % (ref 0.0–0.2)

## 2022-11-09 LAB — PHOSPHORUS: Phosphorus: 2.8 mg/dL (ref 2.5–4.6)

## 2022-11-09 LAB — PROCALCITONIN: Procalcitonin: <0.1 ng/mL

## 2022-11-09 LAB — HIV ANTIBODY (ROUTINE TESTING W REFLEX): HIV Screen 4th Generation wRfx: NONREACTIVE

## 2022-11-09 LAB — CULTURE, BLOOD (ROUTINE X 2)
Special Requests: ADEQUATE
Special Requests: ADEQUATE

## 2022-11-09 LAB — MAGNESIUM: Magnesium: 2.2 mg/dL (ref 1.7–2.4)

## 2022-11-09 MED ORDER — ENSURE MAX PROTEIN PO LIQD
330.0000 mL | Freq: Three times a day (TID) | ORAL | Status: DC
  Administered 2022-11-09 – 2022-11-10 (×4): 330 mL via ORAL

## 2022-11-09 MED ORDER — OXYCODONE HCL 5 MG PO TABS: 5.0000 mg | ORAL_TABLET | Freq: Three times a day (TID) | ORAL | Status: DC | PRN

## 2022-11-09 MED ORDER — TOPIRAMATE 25 MG PO TABS
50.0000 mg | ORAL_TABLET | Freq: Every day | ORAL | Status: DC
  Administered 2022-11-10 – 2022-11-11 (×2): 50 mg via ORAL
  Filled 2022-11-09 (×2): qty 2

## 2022-11-09 MED ORDER — DULOXETINE HCL 60 MG PO CPEP
60.0000 mg | ORAL_CAPSULE | Freq: Every day | ORAL | Status: DC
  Administered 2022-11-09 – 2022-11-11 (×3): 60 mg via ORAL
  Filled 2022-11-09 (×3): qty 1

## 2022-11-09 MED ORDER — BUSPIRONE HCL 5 MG PO TABS
5.0000 mg | ORAL_TABLET | Freq: Three times a day (TID) | ORAL | Status: DC
  Administered 2022-11-09 – 2022-11-11 (×7): 5 mg via ORAL
  Filled 2022-11-09 (×7): qty 1

## 2022-11-09 MED ORDER — OXYCODONE-ACETAMINOPHEN 10-325 MG PO TABS: 1.0000 | ORAL_TABLET | Freq: Three times a day (TID) | ORAL | Status: DC | PRN

## 2022-11-09 MED ORDER — SODIUM CHLORIDE 0.9 % IV SOLN
500.0000 mg | INTRAVENOUS | Status: DC
  Administered 2022-11-09 – 2022-11-10 (×2): 500 mg via INTRAVENOUS
  Filled 2022-11-09 (×2): qty 5

## 2022-11-09 MED ORDER — OXYCODONE HCL 5 MG PO TABS
5.0000 mg | ORAL_TABLET | Freq: Three times a day (TID) | ORAL | Status: DC | PRN
  Administered 2022-11-09 – 2022-11-11 (×4): 5 mg via ORAL
  Filled 2022-11-09 (×4): qty 1

## 2022-11-09 MED ORDER — OXYCODONE-ACETAMINOPHEN 5-325 MG PO TABS: 1.0000 | ORAL_TABLET | Freq: Three times a day (TID) | ORAL | Status: DC | PRN

## 2022-11-09 MED ORDER — HYDROMORPHONE HCL 1 MG/ML IJ SOLN
0.5000 mg | INTRAMUSCULAR | Status: DC | PRN
  Administered 2022-11-09 – 2022-11-11 (×6): 0.5 mg via INTRAVENOUS
  Filled 2022-11-09 (×6): qty 0.5

## 2022-11-09 MED ORDER — SODIUM CHLORIDE 0.9 % IV SOLN
1.0000 g | INTRAVENOUS | Status: DC
  Administered 2022-11-09 – 2022-11-10 (×2): 1 g via INTRAVENOUS
  Filled 2022-11-09 (×2): qty 10

## 2022-11-09 MED ORDER — METHYLPREDNISOLONE SODIUM SUCC 40 MG IJ SOLR
40.0000 mg | Freq: Two times a day (BID) | INTRAMUSCULAR | Status: DC
  Administered 2022-11-09 – 2022-11-11 (×5): 40 mg via INTRAVENOUS
  Filled 2022-11-09 (×5): qty 1

## 2022-11-09 MED ORDER — METHOCARBAMOL 500 MG PO TABS: 500.0000 mg | ORAL_TABLET | Freq: Two times a day (BID) | ORAL | Status: DC | PRN

## 2022-11-09 MED ORDER — NINTEDANIB ESYLATE 150 MG PO CAPS
150.0000 mg | ORAL_CAPSULE | Freq: Two times a day (BID) | ORAL | Status: DC
  Administered 2022-11-10: 150 mg via ORAL

## 2022-11-09 MED ORDER — ENSURE ENLIVE PO LIQD
237.0000 mL | Freq: Two times a day (BID) | ORAL | Status: DC
  Administered 2022-11-09 – 2022-11-10 (×4): 237 mL via ORAL

## 2022-11-09 MED ORDER — DM-GUAIFENESIN ER 30-600 MG PO TB12
1.0000 | ORAL_TABLET | Freq: Two times a day (BID) | ORAL | Status: DC
  Administered 2022-11-09 – 2022-11-11 (×6): 1 via ORAL
  Filled 2022-11-09 (×6): qty 1

## 2022-11-09 MED ORDER — NORTRIPTYLINE HCL 25 MG PO CAPS
50.0000 mg | ORAL_CAPSULE | Freq: Two times a day (BID) | ORAL | Status: DC
  Administered 2022-11-09 – 2022-11-11 (×5): 50 mg via ORAL
  Filled 2022-11-09 (×5): qty 2

## 2022-11-09 MED ORDER — METOPROLOL TARTRATE 25 MG PO TABS
25.0000 mg | ORAL_TABLET | Freq: Two times a day (BID) | ORAL | Status: DC
  Administered 2022-11-10: 25 mg via ORAL
  Filled 2022-11-09 (×4): qty 1

## 2022-11-09 MED ORDER — OXYCODONE-ACETAMINOPHEN 5-325 MG PO TABS
1.0000 | ORAL_TABLET | Freq: Three times a day (TID) | ORAL | Status: DC | PRN
  Administered 2022-11-10 – 2022-11-11 (×2): 1 via ORAL
  Filled 2022-11-09 (×2): qty 1

## 2022-11-09 MED ORDER — ALBUTEROL SULFATE (2.5 MG/3ML) 0.083% IN NEBU
2.5000 mg | INHALATION_SOLUTION | Freq: Four times a day (QID) | RESPIRATORY_TRACT | Status: DC | PRN
  Administered 2022-11-11: 2.5 mg via RESPIRATORY_TRACT
  Filled 2022-11-09: qty 3

## 2022-11-09 MED ORDER — TRAZODONE HCL 50 MG PO TABS: 100.0000 mg | ORAL_TABLET | Freq: Every evening | ORAL | Status: DC | PRN

## 2022-11-09 NOTE — Hospital Course (Signed)
62 y.o. male with medical history significant of pulmonary fibrosis on 2.5 L of oxygen at home and ALS who presents to the emergency department due to to 5 day onset of productive cough, fever and worsening shortness of breath at home.  Patient has had to increase home oxygen from 2.5 to 4 L with O2 sat still being in the mid 80s.  Patient states that the cough today was so much that he felt like he was going to pass out, he states that his chest hurts when he breathes in and out.  He went to urgent care on Thursday (4/4) and was started on azithromycin, but returned to the urgent care on Sunday (4/7) due to persistence of symptoms and antibiotic was switched to doxycycline.  He presented to the emergency department for further evaluation and management.   ED Course:  In the emergency department, he was tachypneic and tachycardic, BP was 160/76, temperature 99.1, O2 sat was 88% on supplemental oxygen at 4 PM, this was increased to 6 LPM with O2 sat at 95%.  ABG done showed hypoxia.  Influenza A, B, SARS coronavirus 2, RSV was negative, blood culture was pending.  Workup in the ED showed normocytic anemia, WBC of 16.4, MCV 94.4, platelets 385.  BMP was normal except for blood glucose of 111, albumin 2.6, lactic acid x 2 was normal

## 2022-11-09 NOTE — Progress Notes (Signed)
  Transition of Care [facility]) Screening Note   Patient Details  Name: KOHLER AGERTON Date of Birth: 09-Nov-1960   Transition of [facility]) CM/SW Contact:    Annice Needy, LCSW Phone Number: 11/09/2022, 11:40 AM    Transition of Care Department [facility]) has reviewed patient and no TOC needs have been identified at this time. We will continue to monitor patient advancement through interdisciplinary progression rounds. If new patient transition needs arise, please place a TOC consult.

## 2022-11-09 NOTE — Progress Notes (Signed)
PROGRESS NOTE   Bradley Rice  NWG:[id number] DOB: 10/20/1960 DOA: 11/08/2022 PCP: Junie SpencerHawks, Christy A, FNP   Chief Complaint  Patient presents with   Shortness of Breath   Level of care: Med-Surg  Brief Admission History:  62 y.o. male with medical history significant of pulmonary fibrosis on 2.5 L of oxygen at home and ALS who presents to the emergency department due to to 5 day onset of productive cough, fever and worsening shortness of breath at home.  Patient has had to increase home oxygen from 2.5 to 4 L with O2 sat still being in the mid 80s.  Patient states that the cough today was so much that he felt like he was going to pass out, he states that his chest hurts when he breathes in and out.  He went to urgent care on Thursday (4/4) and was started on azithromycin, but returned to the urgent care on Sunday (4/7) due to persistence of symptoms and antibiotic was switched to doxycycline.  He presented to the emergency department for further evaluation and management.   ED Course:  In the emergency department, he was tachypneic and tachycardic, BP was 160/76, temperature 99.1, O2 sat was 88% on supplemental oxygen at 4 PM, this was increased to 6 LPM with O2 sat at 95%.  ABG done showed hypoxia.  Influenza A, B, SARS coronavirus 2, RSV was negative, blood culture was pending.  Workup in the ED showed normocytic anemia, WBC of 16.4, MCV 94.4, platelets 385.  BMP was normal except for blood glucose of 111, albumin 2.6, lactic acid x 2 was normal   Assessment and Plan:  Acute on chronic respiratory failure with hypoxia in the setting of CAP POA Chest x-ray was suggestive of multifocal pneumonia Patient was started on ceftriaxone and azithromycin, we shall continue same at this time with plan to de-escalate/discontinue based on blood culture, sputum culture, urine Legionella, strep pneumo and procalcitonin Continue Tylenol as needed Continue Mucinex, incentive spirometry, flutter valve     Loculated right pleural effusion IV Lasix not given  due to soft BP Consider patient for possible thoracentesis if symptoms don't improve or worsen   Hypoalbuminemia possibly secondary to moderate protein calorie malnutrition Albumin 2.6, protein supplement will be provided   Right lung mass Chest x-ray was suggestive of possible right mid to lower lung mass Nonemergent CT scan was recommended by radiologist Outpatient follow up with his pulmonologist Dr. Marchelle Gearingamaswamy was recommended   Idiopathic pulmonary fibrosis Continue Solu-Medrol 40 mg twice daily Continue Proventil, Ofev   ALS Stable, continue fall precaution   Anxiety and depression Continue home buspirone, Cymbalta and nortriptyline    DVT prophylaxis: enoxaparin  Code Status: Full  Family Communication:  Disposition: Status is: Inpatient Remains inpatient appropriate because: IV antibiotics    Consultants:   Procedures:   Antimicrobials:   CTX and azithromycin 4/9>> Subjective: Reports less cough and chest congestion today.   Objective: Vitals:   11/08/22 2030 11/08/22 2200 11/09/22 0026 11/09/22 0417  BP: 105/70 109/71 112/81 108/75  Pulse: 92 98 85 77  Resp: (!) 22 20 20 18   Temp:  98 F (36.7 C) 98.6 F (37 C) 98.1 F (36.7 C)  TempSrc:      SpO2: 95% 91% 98% 95%  Weight:   68 kg   Height:   6\' 4"  (1.93 m)     Intake/Output Summary (Last 24 hours) at 11/09/2022 1053 Last data filed at 11/09/2022 1026 Gross per 24 hour  Intake  1808.01 ml  Output --  Net 1808.01 ml   Filed Weights   11/08/22 1651 11/09/22 0026  Weight: 68.5 kg 68 kg   Examination:  General exam: Appears calm and comfortable  Respiratory system: no increased work of breathing.  Cardiovascular system: normal S1 & S2 heard. No JVD, murmurs, rubs, gallops or clicks. No pedal edema. Gastrointestinal system: Abdomen is nondistended, soft and nontender. No organomegaly or masses felt. Normal bowel sounds heard. Central nervous  system: Alert and oriented. No focal neurological deficits. Extremities: diffuse muscle wasting/weakness BLEs. Skin: No rashes, lesions or ulcers. Psychiatry: Judgement and insight appear normal. Mood & affect appropriate.   Data Reviewed: I have personally reviewed following labs and imaging studies  CBC: Recent Labs  Lab 11/08/22 1845 11/09/22 0514  WBC 16.4* 12.2*  NEUTROABS 14.0*  --   HGB 11.7* 11.6*  HCT 37.2* 36.9*  MCV 94.4 95.8  PLT 385 374    Basic Metabolic Panel: Recent Labs  Lab 11/08/22 1845 11/09/22 0514  NA 138 138  K 3.5 3.5  CL 102 105  CO2 27 27  GLUCOSE 111* 145*  BUN 9 11  CREATININE 0.69 0.58*  CALCIUM 8.7* 8.5*  MG  --  2.2  PHOS  --  2.8    CBG: No results for input(s): "GLUCAP" in the last 168 hours.  Recent Results (from the past 240 hour(s))  Blood Culture (routine x 2)     Status: None (Preliminary result)   Collection Time: 11/08/22  6:45 PM   Specimen: BLOOD  Result Value Ref Range Status   Specimen Description BLOOD BLOOD RIGHT ARM  Final   Special Requests   Final    BOTTLES DRAWN AEROBIC AND ANAEROBIC Blood Culture adequate volume   Culture   Final    NO GROWTH < 12 HOURS Performed at [facility], [address], [place], [place] [address]    Report Status PENDING  Incomplete  Blood Culture (routine x 2)     Status: None (Preliminary result)   Collection Time: 11/08/22  6:45 PM   Specimen: BLOOD  Result Value Ref Range Status   Specimen Description BLOOD BLOOD LEFT ARM  Final   Special Requests   Final    BOTTLES DRAWN AEROBIC AND ANAEROBIC Blood Culture adequate volume   Culture   Final    NO GROWTH < 12 HOURS Performed at [facility], [address] [place], [place] [address]    Report Status PENDING  Incomplete  Resp panel by RT-PCR (RSV, Flu A&B, Covid) Anterior Nasal Swab     Status: None   Collection Time: 11/08/22  9:15 PM   Specimen: Anterior Nasal Swab  Result Value Ref Range Status   SARS Coronavirus 2 by  RT PCR NEGATIVE NEGATIVE Final    Comment: (NOTE) SARS-CoV-2 target nucleic acids are NOT DETECTED.  The SARS-CoV-2 RNA is generally detectable in upper respiratory specimens during the acute phase of infection. The lowest concentration of SARS-CoV-2 viral copies this assay can detect is 138 copies/mL. A negative result does not preclude SARS-Cov-2 infection and should not be used as the sole basis for treatment or other patient management decisions. A negative result may occur with  improper specimen collection/handling, submission of specimen other than nasopharyngeal swab, presence of viral mutation(s) within the areas targeted by this assay, and inadequate number of viral copies(<138 copies/mL). A negative result must be combined with clinical observations, patient history, and epidemiological information. The expected result is Negative.  Fact  Sheet for Patients:  BloggerCourse.com  Fact Sheet for Healthcare Providers:  SeriousBroker.it  This test is no t yet approved or cleared by the [place] FDA and  has been authorized for detection and/or diagnosis of SARS-CoV-2 by FDA under an Emergency Use Authorization (EUA). This EUA will remain  in effect (meaning this test can be used) for the duration of the COVID-19 declaration under Section 564(b)(1) of the Act, 21 U.S.C.section 360bbb-3(b)(1), unless the authorization is terminated  or revoked sooner.       Influenza A by PCR NEGATIVE NEGATIVE Final   Influenza B by PCR NEGATIVE NEGATIVE Final    Comment: (NOTE) The Xpert Xpress SARS-CoV-2/FLU/RSV plus assay is intended as an aid in the diagnosis of influenza from Nasopharyngeal swab specimens and should not be used as a sole basis for treatment. Nasal washings and aspirates are unacceptable for Xpert Xpress SARS-CoV-2/FLU/RSV testing.  Fact Sheet for Patients: BloggerCourse.com  Fact Sheet  for Healthcare Providers: SeriousBroker.it  This test is not yet approved or cleared by the [place] FDA and has been authorized for detection and/or diagnosis of SARS-CoV-2 by FDA under an Emergency Use Authorization (EUA). This EUA will remain in effect (meaning this test can be used) for the duration of the COVID-19 declaration under Section 564(b)(1) of the Act, 21 U.S.C. section 360bbb-3(b)(1), unless the authorization is terminated or revoked.     Resp Syncytial Virus by PCR NEGATIVE NEGATIVE Final    Comment: (NOTE) Fact Sheet for Patients: BloggerCourse.com  Fact Sheet for Healthcare Providers: SeriousBroker.it  This test is not yet approved or cleared by the [place] FDA and has been authorized for detection and/or diagnosis of SARS-CoV-2 by FDA under an Emergency Use Authorization (EUA). This EUA will remain in effect (meaning this test can be used) for the duration of the COVID-19 declaration under Section 564(b)(1) of the Act, 21 U.S.C. section 360bbb-3(b)(1), unless the authorization is terminated or revoked.  Performed at [facility], [address], [place], [place] [address]      Radiology Studies: CT Angio Chest PE W/Cm &/Or Wo Cm  Result Date: 11/08/2022 CLINICAL DATA:  Pulmonary embolism suspected, high probability. Pulmonary fibrosis. EXAM: CT ANGIOGRAPHY CHEST WITH CONTRAST TECHNIQUE: Multidetector CT imaging of the chest was performed using the standard protocol during bolus administration of intravenous contrast. Multiplanar CT image reconstructions and MIPs were obtained to evaluate the vascular anatomy. RADIATION DOSE REDUCTION: This exam was performed according to the departmental dose-optimization program which includes automated exposure control, adjustment of the mA and/or kV according to patient size and/or use of iterative reconstruction technique. CONTRAST:   OMNIPAQUE IOHEXOL 350 MG/ML SOLN COMPARISON:  06/13/2022. FINDINGS: Cardiovascular: The heart is enlarged and there is a small pericardial effusion. Scattered coronary artery calcifications are noted. There is atherosclerotic calcification of the aorta without evidence of aneurysm. The pulmonary trunk is mildly distended suggesting underlying pulmonary artery hypertension. No evidence of pulmonary embolism. Mediastinum/Nodes: Multiple enlarged lymph nodes are present in the mediastinum measuring up to 2.6 cm in the right paratracheal space and 2.1 cm in the subcarinal space, which are likely reactive. Enlarged lymph nodes are present in the right hilum. No axillary lymphadenopathy. The trachea and esophagus are within normal limits. Lungs/Pleura: There is a moderate slightly loculated loculated pleural effusion on the right. No pneumothorax bilaterally. Patchy airspace disease is noted in the lungs bilaterally. There is a irregular masslike consolidation in the right lower lobe. Diffuse ground-glass opacities, bronchiectasis, and honeycombing are noted bilaterally,  compatible with known pulmonary fibrosis. Upper Abdomen: No acute abnormality. Musculoskeletal: No acute osseous abnormality. Review of the MIP images confirms the above findings. IMPRESSION: 1. No evidence of pulmonary embolism. 2. Patchy airspace disease in the lungs bilaterally with masslike consolidation in the right lower lobe, which may be infectious or inflammatory. The possibility of underlying neoplasm can not be excluded. A short-term follow-up is recommended until resolution. 3. Moderate likely loculated pleural effusion on the right. 4. Findings compatible with pulmonary fibrosis. 5. Cardiomegaly with small pericardial effusion and coronary artery calcifications. 6. Distended pulmonary trunk, suggesting underlying pulmonary artery hypertension. 7. Aortic atherosclerosis. Electronically Signed   By: Thornell Sartorius M.D.   On: 11/08/2022 22:53    DG Chest Port 1 View  Result Date: 11/08/2022 CLINICAL DATA:  Questionable sepsis. History of fibrosis. Typically patient patient typically on home oxygen. EXAM: PORTABLE CHEST 1 VIEW COMPARISON:  06/13/2022. FINDINGS: Patchy bilateral airspace opacities, left perihilar mid lung and both lower lungs, new since the prior exam. There is underlying interstitial thickening/fibrosis that is unchanged. Rounded opacity adjacent to the inferior right hilum, up proximally 4.3 cm in size, suspicious for a mass, and corresponding to a focal opacity noted in the superior segment of the right lower lobe on the prior chest CT from 06/13/2022. No convincing pleural effusion and no pneumothorax. Cardiac silhouette is normal in size. No convincing mediastinal mass. Skeletal structures are grossly intact. IMPRESSION: 1. Airspace opacities noted bilaterally, new since the prior chest radiographs and CT from 06/13/2022. Multifocal pneumonia suspected. 2. Possible right mid to lower lung mass. Recommend follow-up chest CT with contrast on a nonemergent basis for further assessment. 3. Underlying pulmonary fibrosis. Electronically Signed   By: Amie Portland M.D.   On: 11/08/2022 18:01    Scheduled Meds:  busPIRone  5 mg Oral TID   dextromethorphan-guaiFENesin  1 tablet Oral BID   DULoxetine  60 mg Oral Daily   enoxaparin (LOVENOX) injection  40 mg Subcutaneous Daily   feeding supplement  237 mL Oral BID BM   methylPREDNISolone (SOLU-MEDROL) injection  40 mg Intravenous Q12H   Nintedanib  150 mg Oral BID   nortriptyline  50 mg Oral BID   Ensure Max Protein  330 mL Oral TID   Continuous Infusions:  azithromycin     cefTRIAXone (ROCEPHIN)  IV      LOS: 1 day   Time spent: 35 mins  Becker Christopher Laural Benes, MD How to contact the [facility] Attending or Consulting provider 7A - 7P or covering provider during after hours 7P -7A, for this patient?  Check the care team in [facility] and look for a) attending/consulting TRH provider listed  and b) the [facility] team listed Log into [email or web address] and use [place]'s universal password to access. If you do not have the password, please contact the hospital operator. Locate the [facility] provider you are looking for under [facility] and page to a number that you can be directly reached. If you still have difficulty reaching the provider, please page the [facility] (Director on Call) for the Hospitalists listed on amion for assistance.  11/09/2022, 10:53 AM

## 2022-11-10 ENCOUNTER — Inpatient Hospital Stay (HOSPITAL_COMMUNITY): Payer: Medicaid Other

## 2022-11-10 DIAGNOSIS — J90 Pleural effusion, not elsewhere classified: Secondary | ICD-10-CM | POA: Diagnosis not present

## 2022-11-10 DIAGNOSIS — J189 Pneumonia, unspecified organism: Secondary | ICD-10-CM | POA: Diagnosis not present

## 2022-11-10 LAB — CBC
HCT: 36.9 % — ABNORMAL LOW (ref 39.0–52.0)
Hemoglobin: 11.4 g/dL — ABNORMAL LOW (ref 13.0–17.0)
MCH: 30.2 pg (ref 26.0–34.0)
MCHC: 30.9 g/dL (ref 30.0–36.0)
MCV: 97.9 fL (ref 80.0–100.0)
Platelets: 411 10*3/uL — ABNORMAL HIGH (ref 150–400)
RBC: 3.77 MIL/uL — ABNORMAL LOW (ref 4.22–5.81)
RDW: 14 % (ref 11.5–15.5)
WBC: 24.8 10*3/uL — ABNORMAL HIGH (ref 4.0–10.5)
nRBC: 0 % (ref 0.0–0.2)

## 2022-11-10 LAB — CULTURE, BLOOD (ROUTINE X 2): Culture: NO GROWTH

## 2022-11-10 NOTE — Progress Notes (Signed)
PROGRESS NOTE   Bradley Rice  WUJ:[id number] DOB: Aug 18, 1960 DOA: 11/08/2022 PCP: [name], FNP   Chief Complaint  Patient presents with   Shortness of Breath   Level of care: Med-Surg  Brief Admission History:  62 y.o. male with medical history significant of pulmonary fibrosis on 2.5 L of oxygen at home and ALS who presents to the emergency department due to to 5 day onset of productive cough, fever and worsening shortness of breath at home.  Patient has had to increase home oxygen from 2.5 to 4 L with O2 sat still being in the mid 80s.  Patient states that the cough today was so much that he felt like he was going to pass out, he states that his chest hurts when he breathes in and out.  He went to urgent care on Thursday (4/4) and was started on azithromycin, but returned to the urgent care on Sunday (4/7) due to persistence of symptoms and antibiotic was switched to doxycycline.  He presented to the emergency department for further evaluation and management.   ED Course:  In the emergency department, he was tachypneic and tachycardic, BP was 160/76, temperature 99.1, O2 sat was 88% on supplemental oxygen at 4 PM, this was increased to 6 LPM with O2 sat at 95%.  ABG done showed hypoxia.  Influenza A, B, SARS coronavirus 2, RSV was negative, blood culture was pending.  Workup in the ED showed normocytic anemia, WBC of 16.4, MCV 94.4, platelets 385.  BMP was normal except for blood glucose of 111, albumin 2.6, lactic acid x 2 was normal   Assessment and Plan:  Acute on chronic respiratory failure with hypoxia in the setting of CAP POA Chest x-ray was suggestive of multifocal pneumonia Patient was started on ceftriaxone and azithromycin, we shall continue same at this time with plan to de-escalate/discontinue based on blood culture, sputum culture, urine Legionella, strep pneumo and procalcitonin Continue Tylenol as needed Continue Mucinex, incentive spirometry, flutter valve   Clinically not improving as well as I had hoped, continue current IV treatment for now Wean oxygen down as able    Loculated right pleural effusion IV Lasix not given  due to soft BP Consider patient for possible thoracentesis if symptoms don't improve or worsen Repeated CXR 4/11 shows small effusion only   Hypoalbuminemia possibly secondary to moderate protein calorie malnutrition Albumin 2.6, protein supplement will be provided   Right lung mass Chest x-ray was suggestive of possible right mid to lower lung mass Nonemergent CT scan was recommended by radiologist Outpatient follow up with his pulmonologist Dr. Marchelle Gearing was recommended    Idiopathic pulmonary fibrosis Continue Solu-Medrol 40 mg twice daily Continue Proventil, Ofev   ALS Stable, continue fall precaution   Anxiety and depression Continue home buspirone, Cymbalta and nortriptyline    DVT prophylaxis: enoxaparin  Code Status: Full  Family Communication:  Disposition: Status is: Inpatient Remains inpatient appropriate because: IV antibiotics    Consultants:   Procedures:   Antimicrobials:   CTX and azithromycin 4/9>>  Subjective: Still having productive cough.   Objective: Vitals:   11/09/22 2224 11/10/22 0052 11/10/22 0353 11/10/22 0844  BP: (!) 91/58 112/72 98/62 101/69  Pulse: 92 94 87 88  Resp:   20   Temp:   98.1 F (36.7 C)   TempSrc:   Oral   SpO2:   (!) 89%   Weight:      Height:        Intake/Output Summary (  Last 24 hours) at 11/10/2022 1352 Last data filed at 11/10/2022 1300 Gross per 24 hour  Intake 1070 ml  Output --  Net 1070 ml   Filed Weights   11/08/22 1651 11/09/22 0026  Weight: 68.5 kg 68 kg   Examination:  General exam: Appears calm and comfortable  Respiratory system: rales heard RLL no increased work of breathing.  Cardiovascular system: normal S1 & S2 heard. No JVD, murmurs, rubs, gallops or clicks. No pedal edema. Gastrointestinal system: Abdomen is  nondistended, soft and nontender. No organomegaly or masses felt. Normal bowel sounds heard. Central nervous system: Alert and oriented. No focal neurological deficits. Extremities: diffuse muscle wasting/weakness BLEs. Skin: No rashes, lesions or ulcers. Psychiatry: Judgement and insight appear normal. Mood & affect appropriate.   Data Reviewed: I have personally reviewed following labs and imaging studies  CBC: Recent Labs  Lab 11/08/22 1845 11/09/22 0514 11/10/22 0441  WBC 16.4* 12.2* 24.8*  NEUTROABS 14.0*  --   --   HGB 11.7* 11.6* 11.4*  HCT 37.2* 36.9* 36.9*  MCV 94.4 95.8 97.9  PLT 385 374 411*    Basic Metabolic Panel: Recent Labs  Lab 11/08/22 1845 11/09/22 0514  NA 138 138  K 3.5 3.5  CL 102 105  CO2 27 27  GLUCOSE 111* 145*  BUN 9 11  CREATININE 0.69 0.58*  CALCIUM 8.7* 8.5*  MG  --  2.2  PHOS  --  2.8    CBG: No results for input(s): "GLUCAP" in the last 168 hours.  Recent Results (from the past 240 hour(s))  Blood Culture (routine x 2)     Status: None (Preliminary result)   Collection Time: 11/08/22  6:45 PM   Specimen: BLOOD  Result Value Ref Range Status   Specimen Description BLOOD BLOOD RIGHT ARM  Final   Special Requests   Final    BOTTLES DRAWN AEROBIC AND ANAEROBIC Blood Culture adequate volume   Culture   Final    NO GROWTH 2 DAYS Performed at [facility], [address] [name], [place] [address]    Report Status PENDING  Incomplete  Blood Culture (routine x 2)     Status: None (Preliminary result)   Collection Time: 11/08/22  6:45 PM   Specimen: BLOOD  Result Value Ref Range Status   Specimen Description BLOOD BLOOD LEFT ARM  Final   Special Requests   Final    BOTTLES DRAWN AEROBIC AND ANAEROBIC Blood Culture adequate volume   Culture   Final    NO GROWTH 2 DAYS Performed at [facility], [address] [place], [place] [address]    Report Status PENDING  Incomplete  Resp panel by RT-PCR (RSV, Flu A&B, Covid) Anterior Nasal  Swab     Status: None   Collection Time: 11/08/22  9:15 PM   Specimen: Anterior Nasal Swab  Result Value Ref Range Status   SARS Coronavirus 2 by RT PCR NEGATIVE NEGATIVE Final    Comment: (NOTE) SARS-CoV-2 target nucleic acids are NOT DETECTED.  The SARS-CoV-2 RNA is generally detectable in upper respiratory specimens during the acute phase of infection. The lowest concentration of SARS-CoV-2 viral copies this assay can detect is 138 copies/mL. A negative result does not preclude SARS-Cov-2 infection and should not be used as the sole basis for treatment or other patient management decisions. A negative result may occur with  improper specimen collection/handling, submission of specimen other than nasopharyngeal swab, presence of viral mutation(s) within the areas targeted by this  assay, and inadequate number of viral copies(<138 copies/mL). A negative result must be combined with clinical observations, patient history, and epidemiological information. The expected result is Negative.  Fact Sheet for Patients:  BloggerCourse.com  Fact Sheet for Healthcare Providers:  SeriousBroker.it  This test is no t yet approved or cleared by the [place] FDA and  has been authorized for detection and/or diagnosis of SARS-CoV-2 by FDA under an Emergency Use Authorization (EUA). This EUA will remain  in effect (meaning this test can be used) for the duration of the COVID-19 declaration under Section 564(b)(1) of the Act, 21 U.S.C.section 360bbb-3(b)(1), unless the authorization is terminated  or revoked sooner.       Influenza A by PCR NEGATIVE NEGATIVE Final   Influenza B by PCR NEGATIVE NEGATIVE Final    Comment: (NOTE) The Xpert Xpress SARS-CoV-2/FLU/RSV plus assay is intended as an aid in the diagnosis of influenza from Nasopharyngeal swab specimens and should not be used as a sole basis for treatment. Nasal washings and aspirates  are unacceptable for Xpert Xpress SARS-CoV-2/FLU/RSV testing.  Fact Sheet for Patients: BloggerCourse.com  Fact Sheet for Healthcare Providers: SeriousBroker.it  This test is not yet approved or cleared by the [place] FDA and has been authorized for detection and/or diagnosis of SARS-CoV-2 by FDA under an Emergency Use Authorization (EUA). This EUA will remain in effect (meaning this test can be used) for the duration of the COVID-19 declaration under Section 564(b)(1) of the Act, 21 U.S.C. section 360bbb-3(b)(1), unless the authorization is terminated or revoked.     Resp Syncytial Virus by PCR NEGATIVE NEGATIVE Final    Comment: (NOTE) Fact Sheet for Patients: BloggerCourse.com  Fact Sheet for Healthcare Providers: SeriousBroker.it  This test is not yet approved or cleared by the [place] FDA and has been authorized for detection and/or diagnosis of SARS-CoV-2 by FDA under an Emergency Use Authorization (EUA). This EUA will remain in effect (meaning this test can be used) for the duration of the COVID-19 declaration under Section 564(b)(1) of the Act, 21 U.S.C. section 360bbb-3(b)(1), unless the authorization is terminated or revoked.  Performed at [facility], [address], [place], [place] [address]      Radiology Studies: DG CHEST PORT 1 VIEW  Result Date: 11/10/2022 CLINICAL DATA:  Multifocal pneumonia EXAM: PORTABLE CHEST 1 VIEW COMPARISON:  November 08, 2022. FINDINGS: Persistent interstitial and patchy airspace opacities, including masslike consolidation at the right lung base. Small right pleural effusion. Enlarged cardiac silhouette. IMPRESSION: 1. Persistent interstitial and patchy airspace opacities, including masslike consolidation at the right lung base that was better characterized on recent CT chest. 2. Small right pleural effusion. 3. Cardiomegaly.  Electronically Signed   By: Feliberto Harts M.D.   On: 11/10/2022 10:19   CT Angio Chest PE W/Cm &/Or Wo Cm  Result Date: 11/08/2022 CLINICAL DATA:  Pulmonary embolism suspected, high probability. Pulmonary fibrosis. EXAM: CT ANGIOGRAPHY CHEST WITH CONTRAST TECHNIQUE: Multidetector CT imaging of the chest was performed using the standard protocol during bolus administration of intravenous contrast. Multiplanar CT image reconstructions and MIPs were obtained to evaluate the vascular anatomy. RADIATION DOSE REDUCTION: This exam was performed according to the departmental dose-optimization program which includes automated exposure control, adjustment of the mA and/or kV according to patient size and/or use of iterative reconstruction technique. CONTRAST:  OMNIPAQUE IOHEXOL 350 MG/ML SOLN COMPARISON:  06/13/2022. FINDINGS: Cardiovascular: The heart is enlarged and there is a small pericardial effusion. Scattered coronary artery calcifications are noted. There  is atherosclerotic calcification of the aorta without evidence of aneurysm. The pulmonary trunk is mildly distended suggesting underlying pulmonary artery hypertension. No evidence of pulmonary embolism. Mediastinum/Nodes: Multiple enlarged lymph nodes are present in the mediastinum measuring up to 2.6 cm in the right paratracheal space and 2.1 cm in the subcarinal space, which are likely reactive. Enlarged lymph nodes are present in the right hilum. No axillary lymphadenopathy. The trachea and esophagus are within normal limits. Lungs/Pleura: There is a moderate slightly loculated loculated pleural effusion on the right. No pneumothorax bilaterally. Patchy airspace disease is noted in the lungs bilaterally. There is a irregular masslike consolidation in the right lower lobe. Diffuse ground-glass opacities, bronchiectasis, and honeycombing are noted bilaterally, compatible with known pulmonary fibrosis. Upper Abdomen: No acute abnormality.  Musculoskeletal: No acute osseous abnormality. Review of the MIP images confirms the above findings. IMPRESSION: 1. No evidence of pulmonary embolism. 2. Patchy airspace disease in the lungs bilaterally with masslike consolidation in the right lower lobe, which may be infectious or inflammatory. The possibility of underlying neoplasm can not be excluded. A short-term follow-up is recommended until resolution. 3. Moderate likely loculated pleural effusion on the right. 4. Findings compatible with pulmonary fibrosis. 5. Cardiomegaly with small pericardial effusion and coronary artery calcifications. 6. Distended pulmonary trunk, suggesting underlying pulmonary artery hypertension. 7. Aortic atherosclerosis. Electronically Signed   By: Thornell Sartorius M.D.   On: 11/08/2022 22:53   DG Chest Port 1 View  Result Date: 11/08/2022 CLINICAL DATA:  Questionable sepsis. History of fibrosis. Typically patient patient typically on home oxygen. EXAM: PORTABLE CHEST 1 VIEW COMPARISON:  06/13/2022. FINDINGS: Patchy bilateral airspace opacities, left perihilar mid lung and both lower lungs, new since the prior exam. There is underlying interstitial thickening/fibrosis that is unchanged. Rounded opacity adjacent to the inferior right hilum, up proximally 4.3 cm in size, suspicious for a mass, and corresponding to a focal opacity noted in the superior segment of the right lower lobe on the prior chest CT from 06/13/2022. No convincing pleural effusion and no pneumothorax. Cardiac silhouette is normal in size. No convincing mediastinal mass. Skeletal structures are grossly intact. IMPRESSION: 1. Airspace opacities noted bilaterally, new since the prior chest radiographs and CT from 06/13/2022. Multifocal pneumonia suspected. 2. Possible right mid to lower lung mass. Recommend follow-up chest CT with contrast on a nonemergent basis for further assessment. 3. Underlying pulmonary fibrosis. Electronically Signed   By: Amie Portland M.D.    On: 11/08/2022 18:01    Scheduled Meds:  busPIRone  5 mg Oral TID   dextromethorphan-guaiFENesin  1 tablet Oral BID   DULoxetine  60 mg Oral Daily   enoxaparin (LOVENOX) injection  40 mg Subcutaneous Daily   feeding supplement  237 mL Oral BID BM   methylPREDNISolone (SOLU-MEDROL) injection  40 mg Intravenous Q12H   metoprolol tartrate  25 mg Oral BID   Nintedanib  150 mg Oral BID   nortriptyline  50 mg Oral BID   Ensure Max Protein  330 mL Oral TID   topiramate  50 mg Oral Daily   Continuous Infusions:  azithromycin 500 mg (11/09/22 1848)   cefTRIAXone (ROCEPHIN)  IV 1 g (11/09/22 1748)    LOS: 2 days   Time spent: 38 mins  Jenille Laszlo Laural Benes, MD How to contact the [facility] Attending or Consulting provider 7A - 7P or covering provider during after hours 7P -7A, for this patient?  Check the care team in [facility] and look for a) attending/consulting [facility] provider listed  and b) the [facility] team listed Log into [email or web address] and use [facility] universal password to access. If you do not have the password, please contact the hospital operator. Locate the [facility] provider you are looking for under [facility] and page to a number that you can be directly reached. If you still have difficulty reaching the provider, please page the [facility] (Director on Call) for the Hospitalists listed on amion for assistance.  11/10/2022, 1:52 PM

## 2022-11-10 NOTE — Progress Notes (Signed)
Nurse called due to patient's sat's 88-89% on 4lpm cann RT went to assess patient changed o2 to 6lpm salter HFNC spo2 increased to 92%

## 2022-11-11 DIAGNOSIS — J9621 Acute and chronic respiratory failure with hypoxia: Secondary | ICD-10-CM | POA: Diagnosis not present

## 2022-11-11 DIAGNOSIS — G1221 Amyotrophic lateral sclerosis: Secondary | ICD-10-CM | POA: Diagnosis not present

## 2022-11-11 DIAGNOSIS — J189 Pneumonia, unspecified organism: Secondary | ICD-10-CM | POA: Diagnosis not present

## 2022-11-11 DIAGNOSIS — E8809 Other disorders of plasma-protein metabolism, not elsewhere classified: Secondary | ICD-10-CM | POA: Diagnosis not present

## 2022-11-11 DIAGNOSIS — E46 Unspecified protein-calorie malnutrition: Secondary | ICD-10-CM | POA: Diagnosis not present

## 2022-11-11 LAB — CULTURE, BLOOD (ROUTINE X 2)

## 2022-11-11 MED ORDER — PREDNISONE 20 MG PO TABS: ORAL_TABLET | ORAL | 0 refills | Status: DC

## 2022-11-11 MED ORDER — GUAIFENESIN ER 600 MG PO TB12: 1200.0000 mg | ORAL_TABLET | Freq: Two times a day (BID) | ORAL | 0 refills | Status: AC

## 2022-11-11 MED ORDER — DEXTROMETHORPHAN POLISTIREX ER 30 MG/5ML PO SUER: 30.0000 mg | Freq: Two times a day (BID) | ORAL | 0 refills | Status: DC | PRN

## 2022-11-11 MED ORDER — METOPROLOL TARTRATE 50 MG PO TABS
25.0000 mg | ORAL_TABLET | Freq: Two times a day (BID) | ORAL | 1 refills | Status: DC
Start: 2022-11-11 — End: 2023-01-03

## 2022-11-11 MED ORDER — TRAZODONE HCL 50 MG PO TABS
100.0000 mg | ORAL_TABLET | Freq: Every day | ORAL | Status: DC
Start: 2022-11-11 — End: 2023-02-02

## 2022-11-11 MED ORDER — LEVOFLOXACIN 750 MG PO TABS: 750.0000 mg | ORAL_TABLET | Freq: Every day | ORAL | 0 refills | Status: AC

## 2022-11-11 NOTE — Discharge Summary (Signed)
Physician Discharge Summary  MATHEWS STUHR GNF:[id number] DOB: 21-Apr-1961 DOA: 11/08/2022  PCP: [name], FNP Pumonologist: Dr. Marchelle Gearing   Admit date: 11/08/2022 Discharge date: 11/11/2022  Admitted From:  Home  Disposition: Home   Recommendations for Outpatient Follow-up:  Follow up with PCP in 1 weeks Follow up with pulmonologist in 2-3 weeks Please obtain repeated CT scan chest to follow up possible right lung mass Please follow up on the following pending results: final culture data   Discharge Condition: STABLE   CODE STATUS: FULL  DIET: regular    Brief Hospitalization Summary: Please see all hospital notes, images, labs for full details of the hospitalization. ADMISSION HPI:  62 y.o. male with medical history significant of pulmonary fibrosis on 2.5 L of oxygen at home and ALS who presents to the emergency department due to to 5 day onset of productive cough, fever and worsening shortness of breath at home.  Patient has had to increase home oxygen from 2.5 to 4 L with O2 sat still being in the mid 80s.  Patient states that the cough today was so much that he felt like he was going to pass out, he states that his chest hurts when he breathes in and out.  He went to urgent care on Thursday (4/4) and was started on azithromycin, but returned to the urgent care on Sunday (4/7) due to persistence of symptoms and antibiotic was switched to doxycycline.  He presented to the emergency department for further evaluation and management.   ED Course:  In the emergency department, he was tachypneic and tachycardic, BP was 160/76, temperature 99.1, O2 sat was 88% on supplemental oxygen at 4 PM, this was increased to 6 LPM with O2 sat at 95%.  ABG done showed hypoxia.  Influenza A, B, SARS coronavirus 2, RSV was negative, blood culture was drawn.  Workup in the ED showed normocytic anemia, WBC of 16.4, MCV 94.4, platelets 385.  BMP was normal except for blood glucose of 111, albumin 2.6, lactic  acid x 2 was normal  HOSPITAL COURSE BY PROBLEM LIST    Acute on chronic respiratory failure with hypoxia in the setting of CAP POA Chest x-ray was suggestive of multifocal pneumonia Patient was started on ceftriaxone and azithromycin, he is clinically improving on this regimen outpatient Follow up final sputum culture, urine Legionella, strep pneumo and procalcitonin Discharge home on oral levofloxacin 750 mg daily x 5  Continue Tylenol as needed Continue Mucinex, incentive spirometry, flutter valve  Clinically he is feeling much better today.  He will discharge on his new home O2 requirement of 5L/min.   Discharge home on oral prednisone taper  Loculated right pleural effusion IV Lasix not given  due to soft BP Consider patient for possible thoracentesis if symptoms don't improve or worsen Repeated CXR 4/11 shows small effusion only Repeat CT chest outpatient with pulmonology clinic in 2 weeks    Hypoalbuminemia possibly secondary to moderate protein calorie malnutrition Albumin 2.6, protein supplement provided   Suspected Right lung mass Chest x-ray was suggestive of possible right mid to lower lung mass vs infection/inflammation Nonemergent CT scan was recommended by radiologist - CTA chest was completed: Patchy airspace disease in the lungs bilaterally with masslike consolidation in the right lower lobe, which may be infectious or inflammatory. The possibility of underlying neoplasm can not be excluded. A short-term follow-up is recommended until resolution. Outpatient follow up with his pulmonologist Dr. Marchelle Gearing was recommended: amb refe to pulmonary nodule clinic completed  at discharge     Idiopathic pulmonary fibrosis He was treated with Solu-Medrol 40 mg twice daily Continue Proventil, Ofev He will be discharging on an oral prednisone taper   ALS Stable, continue fall precaution   Anxiety and depression Continue home buspirone, Cymbalta and nortriptyline     Discharge Diagnoses:  Principal Problem:   CAP (community acquired pneumonia) Active Problems:   Depression   IPF (idiopathic pulmonary fibrosis)   Acute on chronic respiratory failure with hypoxia   Hypoalbuminemia due to protein-calorie malnutrition   ALS (amyotrophic lateral sclerosis)   Anxiety   Pleural effusion on right   Discharge Instructions: Discharge Instructions     AMB  Referral to Pulmonary Nodule Clinic   Complete by: As directed       Allergies as of 11/11/2022       Reactions   Pirfenidone Nausea And Vomiting, Other (See Comments)   Weight loss        Medication List     STOP taking these medications    IBU 600 MG tablet Generic drug: ibuprofen       TAKE these medications    albuterol (2.5 MG/3ML) 0.083% nebulizer solution Commonly known as: PROVENTIL Take 3 mLs (2.5 mg total) by nebulization every 6 (six) hours as needed for wheezing or shortness of breath.   albuterol 108 (90 Base) MCG/ACT inhaler Commonly known as: VENTOLIN HFA Inhale 2 puffs into the lungs every 6 (six) hours as needed for wheezing or shortness of breath.   blood glucose meter kit and supplies Dispense based on patient and insurance preference. Use up to four times daily as directed. (FOR ICD-10 E10.9, E11.9).   busPIRone 5 MG tablet Commonly known as: BUSPAR Take 1 tablet (5 mg total) by mouth 3 (three) times daily.   dextromethorphan 30 MG/5ML liquid Commonly known as: Delsym Take 5 mLs (30 mg total) by mouth 2 (two) times daily as needed for cough.   DULoxetine 60 MG capsule Commonly known as: Cymbalta Take 1 capsule (60 mg total) by mouth daily.   Ensure Max Protein Liqd Take 330 mLs by mouth 3 (three) times daily.   fexofenadine 180 MG tablet Commonly known as: ALLEGRA Take 1 tablet (180 mg total) by mouth daily.   guaiFENesin 600 MG 12 hr tablet Commonly known as: Mucinex Take 2 tablets (1,200 mg total) by mouth 2 (two) times daily for 5 days.    levofloxacin 750 MG tablet Commonly known as: Levaquin Take 1 tablet (750 mg total) by mouth daily for 5 days.   methocarbamol 500 MG tablet Commonly known as: ROBAXIN Take 1 tablet (500 mg total) by mouth 2 (two) times daily as needed for muscle spasms.   metoprolol tartrate 50 MG tablet Commonly known as: LOPRESSOR Take 0.5 tablets (25 mg total) by mouth 2 (two) times daily. What changed: how much to take   naloxone 4 MG/0.1ML Liqd nasal spray kit Commonly known as: NARCAN Place 1 spray into the nose as needed (opioid overdose).   nortriptyline 50 MG capsule Commonly known as: PAMELOR Take 50 mg by mouth 2 (two) times daily.   Ofev 150 MG Caps Generic drug: Nintedanib Take 1 capsule (150 mg total) by mouth 2 (two) times daily.   oxyCODONE 15 MG immediate release tablet Commonly known as: ROXICODONE Take 15 mg by mouth 4 (four) times daily.   oxyCODONE-acetaminophen 10-325 MG tablet Commonly known as: PERCOCET Take 1 tablet by mouth 3 (three) times daily as needed.   predniSONE  20 MG tablet Commonly known as: DELTASONE Take 3 PO QAM x3days, 2 PO QAM x3days, 1 PO QAM x3days Start taking on: November 12, 2022   sildenafil 20 MG tablet Commonly known as: REVATIO Take by mouth.   topiramate 50 MG tablet Commonly known as: TOPAMAX Take by mouth.   traZODone 50 MG tablet Commonly known as: DESYREL Take 2 tablets (100 mg total) by mouth at bedtime.        Follow-up Information     [name], FNP. Schedule an appointment as soon as possible for a visit in 1 week(s).   Specialty: Family Medicine Why: Hospital Follow Up Contact information: [address] [place] [address] [phone]         Kalman Shan, MD. Schedule an appointment as soon as possible for a visit in 2 week(s).   Specialty: Pulmonary Disease Why: Hospital Follow Up Contact information: [address] [place] [address] [phone]                 Allergies  Allergen Reactions   Pirfenidone Nausea And Vomiting and Other (See Comments)    Weight loss   Allergies as of 11/11/2022       Reactions   Pirfenidone Nausea And Vomiting, Other (See Comments)   Weight loss        Medication List     STOP taking these medications    IBU 600 MG tablet Generic drug: ibuprofen       TAKE these medications    albuterol (2.5 MG/3ML) 0.083% nebulizer solution Commonly known as: PROVENTIL Take 3 mLs (2.5 mg total) by nebulization every 6 (six) hours as needed for wheezing or shortness of breath.   albuterol 108 (90 Base) MCG/ACT inhaler Commonly known as: VENTOLIN HFA Inhale 2 puffs into the lungs every 6 (six) hours as needed for wheezing or shortness of breath.   blood glucose meter kit and supplies Dispense based on patient and insurance preference. Use up to four times daily as directed. (FOR ICD-10 E10.9, E11.9).   busPIRone 5 MG tablet Commonly known as: BUSPAR Take 1 tablet (5 mg total) by mouth 3 (three) times daily.   dextromethorphan 30 MG/5ML liquid Commonly known as: Delsym Take 5 mLs (30 mg total) by mouth 2 (two) times daily as needed for cough.   DULoxetine 60 MG capsule Commonly known as: Cymbalta Take 1 capsule (60 mg total) by mouth daily.   Ensure Max Protein Liqd Take 330 mLs by mouth 3 (three) times daily.   fexofenadine 180 MG tablet Commonly known as: ALLEGRA Take 1 tablet (180 mg total) by mouth daily.   guaiFENesin 600 MG 12 hr tablet Commonly known as: Mucinex Take 2 tablets (1,200 mg total) by mouth 2 (two) times daily for 5 days.   levofloxacin 750 MG tablet Commonly known as: Levaquin Take 1 tablet (750 mg total) by mouth daily for 5 days.   methocarbamol 500 MG tablet Commonly known as: ROBAXIN Take 1 tablet (500 mg total) by mouth 2 (two) times daily as needed for muscle spasms.   metoprolol tartrate 50 MG tablet Commonly known as: LOPRESSOR Take 0.5 tablets (25 mg total) by  mouth 2 (two) times daily. What changed: how much to take   naloxone 4 MG/0.1ML Liqd nasal spray kit Commonly known as: NARCAN Place 1 spray into the nose as needed (opioid overdose).   nortriptyline 50 MG capsule Commonly known as: PAMELOR Take 50 mg by mouth 2 (  two) times daily.   Ofev 150 MG Caps Generic drug: Nintedanib Take 1 capsule (150 mg total) by mouth 2 (two) times daily.   oxyCODONE 15 MG immediate release tablet Commonly known as: ROXICODONE Take 15 mg by mouth 4 (four) times daily.   oxyCODONE-acetaminophen 10-325 MG tablet Commonly known as: PERCOCET Take 1 tablet by mouth 3 (three) times daily as needed.   predniSONE 20 MG tablet Commonly known as: DELTASONE Take 3 PO QAM x3days, 2 PO QAM x3days, 1 PO QAM x3days Start taking on: November 12, 2022   sildenafil 20 MG tablet Commonly known as: REVATIO Take by mouth.   topiramate 50 MG tablet Commonly known as: TOPAMAX Take by mouth.   traZODone 50 MG tablet Commonly known as: DESYREL Take 2 tablets (100 mg total) by mouth at bedtime.        Procedures/Studies: DG CHEST PORT 1 VIEW  Result Date: 11/10/2022 CLINICAL DATA:  Multifocal pneumonia EXAM: PORTABLE CHEST 1 VIEW COMPARISON:  November 08, 2022. FINDINGS: Persistent interstitial and patchy airspace opacities, including masslike consolidation at the right lung base. Small right pleural effusion. Enlarged cardiac silhouette. IMPRESSION: 1. Persistent interstitial and patchy airspace opacities, including masslike consolidation at the right lung base that was better characterized on recent CT chest. 2. Small right pleural effusion. 3. Cardiomegaly. Electronically Signed   By: Feliberto Harts M.D.   On: 11/10/2022 10:19   CT Angio Chest PE W/Cm &/Or Wo Cm  Result Date: 11/08/2022 CLINICAL DATA:  Pulmonary embolism suspected, high probability. Pulmonary fibrosis. EXAM: CT ANGIOGRAPHY CHEST WITH CONTRAST TECHNIQUE: Multidetector CT imaging of the chest was  performed using the standard protocol during bolus administration of intravenous contrast. Multiplanar CT image reconstructions and MIPs were obtained to evaluate the vascular anatomy. RADIATION DOSE REDUCTION: This exam was performed according to the departmental dose-optimization program which includes automated exposure control, adjustment of the mA and/or kV according to patient size and/or use of iterative reconstruction technique. CONTRAST:  OMNIPAQUE IOHEXOL 350 MG/ML SOLN COMPARISON:  06/13/2022. FINDINGS: Cardiovascular: The heart is enlarged and there is a small pericardial effusion. Scattered coronary artery calcifications are noted. There is atherosclerotic calcification of the aorta without evidence of aneurysm. The pulmonary trunk is mildly distended suggesting underlying pulmonary artery hypertension. No evidence of pulmonary embolism. Mediastinum/Nodes: Multiple enlarged lymph nodes are present in the mediastinum measuring up to 2.6 cm in the right paratracheal space and 2.1 cm in the subcarinal space, which are likely reactive. Enlarged lymph nodes are present in the right hilum. No axillary lymphadenopathy. The trachea and esophagus are within normal limits. Lungs/Pleura: There is a moderate slightly loculated loculated pleural effusion on the right. No pneumothorax bilaterally. Patchy airspace disease is noted in the lungs bilaterally. There is a irregular masslike consolidation in the right lower lobe. Diffuse ground-glass opacities, bronchiectasis, and honeycombing are noted bilaterally, compatible with known pulmonary fibrosis. Upper Abdomen: No acute abnormality. Musculoskeletal: No acute osseous abnormality. Review of the MIP images confirms the above findings. IMPRESSION: 1. No evidence of pulmonary embolism. 2. Patchy airspace disease in the lungs bilaterally with masslike consolidation in the right lower lobe, which may be infectious or inflammatory. The possibility of underlying  neoplasm can not be excluded. A short-term follow-up is recommended until resolution. 3. Moderate likely loculated pleural effusion on the right. 4. Findings compatible with pulmonary fibrosis. 5. Cardiomegaly with small pericardial effusion and coronary artery calcifications. 6. Distended pulmonary trunk, suggesting underlying pulmonary artery hypertension. 7. Aortic atherosclerosis. Electronically Signed  By: Thornell Sartorius M.D.   On: 11/08/2022 22:53   DG Chest Port 1 View  Result Date: 11/08/2022 CLINICAL DATA:  Questionable sepsis. History of fibrosis. Typically patient patient typically on home oxygen. EXAM: PORTABLE CHEST 1 VIEW COMPARISON:  06/13/2022. FINDINGS: Patchy bilateral airspace opacities, left perihilar mid lung and both lower lungs, new since the prior exam. There is underlying interstitial thickening/fibrosis that is unchanged. Rounded opacity adjacent to the inferior right hilum, up proximally 4.3 cm in size, suspicious for a mass, and corresponding to a focal opacity noted in the superior segment of the right lower lobe on the prior chest CT from 06/13/2022. No convincing pleural effusion and no pneumothorax. Cardiac silhouette is normal in size. No convincing mediastinal mass. Skeletal structures are grossly intact. IMPRESSION: 1. Airspace opacities noted bilaterally, new since the prior chest radiographs and CT from 06/13/2022. Multifocal pneumonia suspected. 2. Possible right mid to lower lung mass. Recommend follow-up chest CT with contrast on a nonemergent basis for further assessment. 3. Underlying pulmonary fibrosis. Electronically Signed   By: Amie Portland M.D.   On: 11/08/2022 18:01     Subjective: Pt says he is breathing much better, he says that he feels no SOB on new oxygen level. He says he wants to go home but will follow up with his pulmonologist regarding possible lung mass.   Discharge Exam: Vitals:   11/11/22 0440 11/11/22 0852  BP: 107/82 115/76  Pulse: 87 83   Resp: 18   Temp: 97.7 F (36.5 C)   SpO2: 96%    Vitals:   11/10/22 1355 11/10/22 2100 11/11/22 0440 11/11/22 0852  BP: 106/70 100/64 107/82 115/76  Pulse: 86 93 87 83  Resp:  18 18   Temp: 97.6 F (36.4 C) 98.4 F (36.9 C) 97.7 F (36.5 C)   TempSrc: Oral Oral Oral   SpO2: 94% 96% 96%   Weight:      Height:       General exam: thin male, chronically ill appearing, NAD, Appears calm and comfortable  Respiratory system:  no increased work of breathing.  Cardiovascular system: normal S1 & S2 heard. No JVD, murmurs, rubs, gallops or clicks. No pedal edema. Gastrointestinal system: Abdomen is nondistended, soft and nontender. No organomegaly or masses felt. Normal bowel sounds heard. Central nervous system: Alert and oriented. No focal neurological deficits. Extremities: diffuse muscle wasting/weakness BLEs. Skin: No rashes, lesions or ulcers. Psychiatry: Judgement and insight appear normal. Mood & affect appropriate.    The results of significant diagnostics from this hospitalization (including imaging, microbiology, ancillary and laboratory) are listed below for reference.     Microbiology: Recent Results (from the past 240 hour(s))  Blood Culture (routine x 2)     Status: None (Preliminary result)   Collection Time: 11/08/22  6:45 PM   Specimen: BLOOD  Result Value Ref Range Status   Specimen Description BLOOD BLOOD RIGHT ARM  Final   Special Requests   Final    BOTTLES DRAWN AEROBIC AND ANAEROBIC Blood Culture adequate volume   Culture   Final    NO GROWTH 3 DAYS Performed at [facility], [address], [place], [place] [address]    Report Status PENDING  Incomplete  Blood Culture (routine x 2)     Status: None (Preliminary result)   Collection Time: 11/08/22  6:45 PM   Specimen: BLOOD  Result Value Ref Range Status   Specimen Description BLOOD BLOOD LEFT ARM  Final   Special Requests  Final    BOTTLES DRAWN AEROBIC AND ANAEROBIC Blood Culture adequate volume    Culture   Final    NO GROWTH 3 DAYS Performed at [facility], [address] [place], [place] [address]    Report Status PENDING  Incomplete  Resp panel by RT-PCR (RSV, Flu A&B, Covid) Anterior Nasal Swab     Status: None   Collection Time: 11/08/22  9:15 PM   Specimen: Anterior Nasal Swab  Result Value Ref Range Status   SARS Coronavirus 2 by RT PCR NEGATIVE NEGATIVE Final    Comment: (NOTE) SARS-CoV-2 target nucleic acids are NOT DETECTED.  The SARS-CoV-2 RNA is generally detectable in upper respiratory specimens during the acute phase of infection. The lowest concentration of SARS-CoV-2 viral copies this assay can detect is 138 copies/mL. A negative result does not preclude SARS-Cov-2 infection and should not be used as the sole basis for treatment or other patient management decisions. A negative result may occur with  improper specimen collection/handling, submission of specimen other than nasopharyngeal swab, presence of viral mutation(s) within the areas targeted by this assay, and inadequate number of viral copies(<138 copies/mL). A negative result must be combined with clinical observations, patient history, and epidemiological information. The expected result is Negative.  Fact Sheet for Patients:  BloggerCourse.com  Fact Sheet for Healthcare Providers:  SeriousBroker.it  This test is no t yet approved or cleared by the [place] FDA and  has been authorized for detection and/or diagnosis of SARS-CoV-2 by FDA under an Emergency Use Authorization (EUA). This EUA will remain  in effect (meaning this test can be used) for the duration of the COVID-19 declaration under Section 564(b)(1) of the Act, 21 U.S.C.section 360bbb-3(b)(1), unless the authorization is terminated  or revoked sooner.       Influenza A by PCR NEGATIVE NEGATIVE Final   Influenza B by PCR NEGATIVE NEGATIVE Final    Comment: (NOTE) The Xpert  Xpress SARS-CoV-2/FLU/RSV plus assay is intended as an aid in the diagnosis of influenza from Nasopharyngeal swab specimens and should not be used as a sole basis for treatment. Nasal washings and aspirates are unacceptable for Xpert Xpress SARS-CoV-2/FLU/RSV testing.  Fact Sheet for Patients: BloggerCourse.com  Fact Sheet for Healthcare Providers: SeriousBroker.it  This test is not yet approved or cleared by the [place] FDA and has been authorized for detection and/or diagnosis of SARS-CoV-2 by FDA under an Emergency Use Authorization (EUA). This EUA will remain in effect (meaning this test can be used) for the duration of the COVID-19 declaration under Section 564(b)(1) of the Act, 21 U.S.C. section 360bbb-3(b)(1), unless the authorization is terminated or revoked.     Resp Syncytial Virus by PCR NEGATIVE NEGATIVE Final    Comment: (NOTE) Fact Sheet for Patients: BloggerCourse.com  Fact Sheet for Healthcare Providers: SeriousBroker.it  This test is not yet approved or cleared by the [place] FDA and has been authorized for detection and/or diagnosis of SARS-CoV-2 by FDA under an Emergency Use Authorization (EUA). This EUA will remain in effect (meaning this test can be used) for the duration of the COVID-19 declaration under Section 564(b)(1) of the Act, 21 U.S.C. section 360bbb-3(b)(1), unless the authorization is terminated or revoked.  Performed at [facility], [address] [place], [place] [address]      Labs: BNP (last 3 results) No results for input(s): "BNP" in the last 8760 hours. Basic Metabolic Panel: Recent Labs  Lab 11/08/22 1845 11/09/22 0514  NA 138 138  K  3.5 3.5  CL 102 105  CO2 27 27  GLUCOSE 111* 145*  BUN 9 11  CREATININE 0.69 0.58*  CALCIUM 8.7* 8.5*  MG  --  2.2  PHOS  --  2.8   Liver Function Tests: Recent Labs  Lab  11/08/22 1845 11/09/22 0514  AST 23 17  ALT 31 27  ALKPHOS 110 100  BILITOT 1.0 0.6  PROT 7.0 6.5  ALBUMIN 2.6* 2.5*   No results for input(s): "LIPASE", "AMYLASE" in the last 168 hours. No results for input(s): "AMMONIA" in the last 168 hours. CBC: Recent Labs  Lab 11/08/22 1845 11/09/22 0514 11/10/22 0441  WBC 16.4* 12.2* 24.8*  NEUTROABS 14.0*  --   --   HGB 11.7* 11.6* 11.4*  HCT 37.2* 36.9* 36.9*  MCV 94.4 95.8 97.9  PLT 385 374 411*   Cardiac Enzymes: No results for input(s): "CKTOTAL", "CKMB", "CKMBINDEX", "TROPONINI" in the last 168 hours. BNP: Invalid input(s): "POCBNP" CBG: No results for input(s): "GLUCAP" in the last 168 hours. D-Dimer No results for input(s): "DDIMER" in the last 72 hours. Hgb A1c No results for input(s): "HGBA1C" in the last 72 hours. Lipid Profile No results for input(s): "CHOL", "HDL", "LDLCALC", "TRIG", "CHOLHDL", "LDLDIRECT" in the last 72 hours. Thyroid function studies No results for input(s): "TSH", "T4TOTAL", "T3FREE", "THYROIDAB" in the last 72 hours.  Invalid input(s): "FREET3" Anemia work up No results for input(s): "VITAMINB12", "FOLATE", "FERRITIN", "TIBC", "IRON", "RETICCTPCT" in the last 72 hours. Urinalysis    Component Value Date/Time   COLORURINE STRAW (A) 03/18/2021 1119   APPEARANCEUR CLEAR 03/18/2021 1119   APPEARANCEUR Clear 06/13/2017 1545   LABSPEC 1.003 (L) 03/18/2021 1119   PHURINE 7.0 03/18/2021 1119   GLUCOSEU NEGATIVE 03/18/2021 1119   HGBUR NEGATIVE 03/18/2021 1119   BILIRUBINUR NEGATIVE 03/18/2021 1119   BILIRUBINUR Negative 06/13/2017 1545   KETONESUR NEGATIVE 03/18/2021 1119   PROTEINUR NEGATIVE 03/18/2021 1119   UROBILINOGEN 1.0 12/28/2013 1703   NITRITE NEGATIVE 03/18/2021 1119   LEUKOCYTESUR NEGATIVE 03/18/2021 1119   Sepsis Labs Recent Labs  Lab 11/08/22 1845 11/09/22 0514 11/10/22 0441  WBC 16.4* 12.2* 24.8*   Microbiology Recent Results (from the past 240 hour(s))  Blood  Culture (routine x 2)     Status: None (Preliminary result)   Collection Time: 11/08/22  6:45 PM   Specimen: BLOOD  Result Value Ref Range Status   Specimen Description BLOOD BLOOD RIGHT ARM  Final   Special Requests   Final    BOTTLES DRAWN AEROBIC AND ANAEROBIC Blood Culture adequate volume   Culture   Final    NO GROWTH 3 DAYS Performed at [facility], [address], [place], [place] [address]    Report Status PENDING  Incomplete  Blood Culture (routine x 2)     Status: None (Preliminary result)   Collection Time: 11/08/22  6:45 PM   Specimen: BLOOD  Result Value Ref Range Status   Specimen Description BLOOD BLOOD LEFT ARM  Final   Special Requests   Final    BOTTLES DRAWN AEROBIC AND ANAEROBIC Blood Culture adequate volume   Culture   Final    NO GROWTH 3 DAYS Performed at [facility], [address] [place], [place] [address]    Report Status PENDING  Incomplete  Resp panel by RT-PCR (RSV, Flu A&B, Covid) Anterior Nasal Swab     Status: None   Collection Time: 11/08/22  9:15 PM   Specimen: Anterior Nasal Swab  Result Value Ref Range  Status   SARS Coronavirus 2 by RT PCR NEGATIVE NEGATIVE Final    Comment: (NOTE) SARS-CoV-2 target nucleic acids are NOT DETECTED.  The SARS-CoV-2 RNA is generally detectable in upper respiratory specimens during the acute phase of infection. The lowest concentration of SARS-CoV-2 viral copies this assay can detect is 138 copies/mL. A negative result does not preclude SARS-Cov-2 infection and should not be used as the sole basis for treatment or other patient management decisions. A negative result may occur with  improper specimen collection/handling, submission of specimen other than nasopharyngeal swab, presence of viral mutation(s) within the areas targeted by this assay, and inadequate number of viral copies(<138 copies/mL). A negative result must be combined with clinical observations, patient history, and  epidemiological information. The expected result is Negative.  Fact Sheet for Patients:  BloggerCourse.com  Fact Sheet for Healthcare Providers:  SeriousBroker.it  This test is no t yet approved or cleared by the [place] FDA and  has been authorized for detection and/or diagnosis of SARS-CoV-2 by FDA under an Emergency Use Authorization (EUA). This EUA will remain  in effect (meaning this test can be used) for the duration of the COVID-19 declaration under Section 564(b)(1) of the Act, 21 U.S.C.section 360bbb-3(b)(1), unless the authorization is terminated  or revoked sooner.       Influenza A by PCR NEGATIVE NEGATIVE Final   Influenza B by PCR NEGATIVE NEGATIVE Final    Comment: (NOTE) The Xpert Xpress SARS-CoV-2/FLU/RSV plus assay is intended as an aid in the diagnosis of influenza from Nasopharyngeal swab specimens and should not be used as a sole basis for treatment. Nasal washings and aspirates are unacceptable for Xpert Xpress SARS-CoV-2/FLU/RSV testing.  Fact Sheet for Patients: BloggerCourse.com  Fact Sheet for Healthcare Providers: SeriousBroker.it  This test is not yet approved or cleared by the [place] FDA and has been authorized for detection and/or diagnosis of SARS-CoV-2 by FDA under an Emergency Use Authorization (EUA). This EUA will remain in effect (meaning this test can be used) for the duration of the COVID-19 declaration under Section 564(b)(1) of the Act, 21 U.S.C. section 360bbb-3(b)(1), unless the authorization is terminated or revoked.     Resp Syncytial Virus by PCR NEGATIVE NEGATIVE Final    Comment: (NOTE) Fact Sheet for Patients: BloggerCourse.com  Fact Sheet for Healthcare Providers: SeriousBroker.it  This test is not yet approved or cleared by the [place] FDA and has been  authorized for detection and/or diagnosis of SARS-CoV-2 by FDA under an Emergency Use Authorization (EUA). This EUA will remain in effect (meaning this test can be used) for the duration of the COVID-19 declaration under Section 564(b)(1) of the Act, 21 U.S.C. section 360bbb-3(b)(1), unless the authorization is terminated or revoked.  Performed at [facility], [address], [place], [place] [address]    Time coordinating discharge: 37 mins   SIGNED:  Standley Dakins, MD  [facility] 11/11/2022, 11:40 AM How to contact the [facility] Attending or Consulting provider 7A - 7P or covering provider during after hours 7P -7A, for this patient?  Check the care team in [facility] and look for a) attending/consulting [facility] provider listed and b) the [facility] team listed Log into [email or web address] and use [place]'s universal password to access. If you do not have the password, please contact the hospital operator. Locate the [facility] provider you are looking for under [facility] and page to a number that you can be directly reached. If you still have difficulty reaching the provider, please  page the [facility] (Director on Call) for the Hospitalists listed on amion for assistance.

## 2022-11-11 NOTE — Plan of Care (Signed)

## 2022-11-11 NOTE — Discharge Instructions (Signed)
INCREASE HOME OXYGEN TO 5L/MIN FOLLOW UP WITH LUNG DOCTOR IN 2 WEEKS  IMPORTANT INFORMATION: PAY CLOSE ATTENTION   PHYSICIAN DISCHARGE INSTRUCTIONS  Follow with Primary care provider  [name], FNP  and other consultants as instructed by your Hospitalist Physician  SEEK MEDICAL CARE OR RETURN TO EMERGENCY ROOM IF SYMPTOMS COME BACK, WORSEN OR NEW PROBLEM DEVELOPS   Please note: You were cared for by a hospitalist during your hospital stay. Every effort will be made to forward records to your primary care provider.  You can request that your primary care provider send for your hospital records if they have not received them.  Once you are discharged, your primary care physician will handle any further medical issues. Please note that NO REFILLS for any discharge medications will be authorized once you are discharged, as it is imperative that you return to your primary care physician (or establish a relationship with a primary care physician if you do not have one) for your post hospital discharge needs so that they can reassess your need for medications and monitor your lab values.  Please get a complete blood count and chemistry panel checked by your Primary MD at your next visit, and again as instructed by your Primary MD.  Get Medicines reviewed and adjusted: Please take all your medications with you for your next visit with your Primary MD  Laboratory/radiological data: Please request your Primary MD to go over all hospital tests and procedure/radiological results at the follow up, please ask your primary care provider to get all Hospital records sent to his/her office.  In some cases, they will be blood work, cultures and biopsy results pending at the time of your discharge. Please request that your primary care provider follow up on these results.  If you are diabetic, please bring your blood sugar readings with you to your follow up appointment with primary care.    Please  call and make your follow up appointments as soon as possible.    Also Note the following: If you experience worsening of your admission symptoms, develop shortness of breath, life threatening emergency, suicidal or homicidal thoughts you must seek medical attention immediately by calling 911 or calling your MD immediately  if symptoms less severe.  You must read complete instructions/literature along with all the possible adverse reactions/side effects for all the Medicines you take and that have been prescribed to you. Take any new Medicines after you have completely understood and accpet all the possible adverse reactions/side effects.   Do not drive when taking Pain medications or sleeping medications (Benzodiazepines)  Do not take more than prescribed Pain, Sleep and Anxiety Medications. It is not advisable to combine anxiety,sleep and pain medications without talking with your primary care practitioner  Special Instructions: If you have smoked or chewed Tobacco  in the last 2 yrs please stop smoking, stop any regular Alcohol  and or any Recreational drug use.  Wear Seat belts while driving.  Do not drive if taking any narcotic, mind altering or controlled substances or recreational drugs or alcohol.

## 2022-11-13 LAB — CULTURE, BLOOD (ROUTINE X 2)

## 2022-11-14 ENCOUNTER — Encounter (HOSPITAL_BASED_OUTPATIENT_CLINIC_OR_DEPARTMENT_OTHER): Payer: Self-pay

## 2022-11-18 ENCOUNTER — Encounter: Payer: Self-pay | Admitting: Family

## 2022-11-18 ENCOUNTER — Ambulatory Visit: Payer: Medicaid Other | Admitting: Family

## 2022-11-18 VITALS — BP 104/69 | HR 56 | Temp 97.9°F | Ht 76.0 in | Wt 143.0 lb

## 2022-11-18 DIAGNOSIS — J9621 Acute and chronic respiratory failure with hypoxia: Secondary | ICD-10-CM

## 2022-11-18 DIAGNOSIS — Z09 Encounter for follow-up examination after completed treatment for conditions other than malignant neoplasm: Secondary | ICD-10-CM

## 2022-11-18 DIAGNOSIS — J439 Emphysema, unspecified: Secondary | ICD-10-CM | POA: Diagnosis not present

## 2022-11-18 DIAGNOSIS — E441 Mild protein-calorie malnutrition: Secondary | ICD-10-CM | POA: Diagnosis not present

## 2022-11-18 DIAGNOSIS — R911 Solitary pulmonary nodule: Secondary | ICD-10-CM | POA: Diagnosis not present

## 2022-11-18 DIAGNOSIS — J189 Pneumonia, unspecified organism: Secondary | ICD-10-CM

## 2022-11-18 DIAGNOSIS — R918 Other nonspecific abnormal finding of lung field: Secondary | ICD-10-CM

## 2022-11-18 DIAGNOSIS — B370 Candidal stomatitis: Secondary | ICD-10-CM

## 2022-11-18 DIAGNOSIS — J84112 Idiopathic pulmonary fibrosis: Secondary | ICD-10-CM

## 2022-11-18 DIAGNOSIS — R051 Acute cough: Secondary | ICD-10-CM | POA: Diagnosis not present

## 2022-11-18 LAB — CBC WITH DIFFERENTIAL/PLATELET
Basophils Absolute: 0 10*3/uL (ref 0.0–0.2)
EOS (ABSOLUTE): 0 10*3/uL (ref 0.0–0.4)
Hemoglobin: 12.8 g/dL — ABNORMAL LOW (ref 13.0–17.7)
Immature Granulocytes: 2 %
Lymphs: 8 %
MCHC: 32.7 g/dL (ref 31.5–35.7)
Monocytes Absolute: 1.2 10*3/uL — ABNORMAL HIGH (ref 0.1–0.9)
Neutrophils Absolute: 12.5 10*3/uL — ABNORMAL HIGH (ref 1.4–7.0)
Neutrophils: 82 %

## 2022-11-18 LAB — CMP14+EGFR: Chloride: 103 mmol/L (ref 96–106)

## 2022-11-18 MED ORDER — MAGIC MOUTHWASH W/LIDOCAINE: 10.0000 mL | Freq: Three times a day (TID) | ORAL | 1 refills | Status: DC | PRN

## 2022-11-18 MED ORDER — ALBUTEROL SULFATE HFA 108 (90 BASE) MCG/ACT IN AERS: 2.0000 | INHALATION_SPRAY | Freq: Four times a day (QID) | RESPIRATORY_TRACT | 5 refills | Status: DC | PRN

## 2022-11-18 MED ORDER — BENZONATATE 200 MG PO CAPS: 200.0000 mg | ORAL_CAPSULE | Freq: Three times a day (TID) | ORAL | 2 refills | Status: DC | PRN

## 2022-11-18 MED ORDER — ENSURE MAX PROTEIN PO LIQD
330.0000 mL | Freq: Three times a day (TID) | ORAL | 2 refills | Status: DC
Start: 2022-11-18 — End: 2023-01-12

## 2022-11-18 NOTE — Progress Notes (Signed)
Subjective:    Patient ID: Bradley Rice, male    DOB: 1960-10-17, 62 y.o.   MRN: [id number]  Chief Complaint  Patient presents with   Hospitalization Follow-up   PT presents to the office today for hospital follow up. He went to the ED on 11/08/22 with SOB and was diagnosed with multifocal pneumonia, possible right lung mass. He is followed by Pulmonologist and has follow up appointment next month. He has idiopathic pulmonary fibrosis.   He was discharged on levofloxacin and prednisone dose pack. He reports his breathing is better, but continues to have SOB with exertion. He is wearing O2 at home as needed.   He has COPD and continues to smoke <1/2 pack a day.   He is complaining of oral thrush that started when he was hospitalized.  Shortness of Breath This is a chronic problem. The current episode started more than 1 year ago. The problem occurs intermittently. Associated symptoms include chest pain and sputum production. Pertinent negatives include no ear pain, fever or headaches. Risk factors include smoking.  Nicotine Dependence Presents for follow-up visit. His urge triggers include company of smokers. He smokes < 1/2 a pack of cigarettes per day.        Review of Systems  Constitutional:  Negative for fever.  HENT:  Negative for ear pain.   Respiratory:  Positive for sputum production and shortness of breath.   Cardiovascular:  Positive for chest pain.  Neurological:  Negative for headaches.  All other systems reviewed and are negative.      Objective:   Physical Exam Vitals reviewed.  Constitutional:      General: He is not in acute distress.    Appearance: He is underweight.  HENT:     Head: Normocephalic.     Right Ear: Tympanic membrane normal.     Left Ear: Tympanic membrane normal.  Eyes:     General:        Right eye: No discharge.        Left eye: No discharge.     Pupils: Pupils are equal, round, and reactive to light.  Neck:     Thyroid: No  thyromegaly.  Cardiovascular:     Rate and Rhythm: Normal rate and regular rhythm.     Heart sounds: Normal heart sounds. No murmur heard. Pulmonary:     Effort: Pulmonary effort is normal. No respiratory distress.     Breath sounds: Rhonchi present. No wheezing.  Abdominal:     General: Bowel sounds are normal. There is no distension.     Palpations: Abdomen is soft.     Tenderness: There is no abdominal tenderness.  Musculoskeletal:        General: No tenderness. Normal range of motion.     Cervical back: Normal range of motion and neck supple.  Skin:    General: Skin is warm and dry.     Findings: No erythema or rash.  Neurological:     Mental Status: He is alert and oriented to person, place, and time.     Cranial Nerves: No cranial nerve deficit.     Deep Tendon Reflexes: Reflexes are normal and symmetric.  Psychiatric:        Behavior: Behavior normal.        Thought Content: Thought content normal.        Judgment: Judgment normal.       BP 104/69   Pulse (!) 56   Temp 97.9 F (36.6 C) (  Temporal)   Ht  (1.93 m)   Wt 143 lb (64.9 kg)   SpO2 93%   BMI 17.41 kg/m      Assessment & Plan:  [name] comes in today with chief complaint of Hospitalization Follow-up   Diagnosis and orders addressed:  1. Community acquired pneumonia, unspecified laterality - CMP14+EGFR - CBC with Differential/Platelet - CT Chest W Contrast; Future  2. Acute on chronic respiratory failure with hypoxia - CMP14+EGFR - CBC with Differential/Platelet - CT Chest W Contrast; Future  3. Pulmonary emphysema, unspecified emphysema type - CMP14+EGFR - CBC with Differential/Platelet - CT Chest W Contrast; Future  4. Pulmonary nodules - CMP14+EGFR - CBC with Differential/Platelet - CT Chest W Contrast; Future  5. IPF (idiopathic pulmonary fibrosis) - CMP14+EGFR - CBC with Differential/Platelet  6. Hospital discharge follow-up  - CMP14+EGFR - CBC with  Differential/Platelet  7. Solitary pulmonary nodule - CMP14+EGFR - CBC with Differential/Platelet - CT Chest W Contrast; Future  8. Oral thrush - CMP14+EGFR - CBC with Differential/Platelet  9. Mild protein-calorie malnutrition  - Ensure Max Protein (ENSURE MAX PROTEIN) LIQD; Take 330 mLs by mouth 3 (three) times daily.  Dispense: 29700 mL; Refill: 2   Labs pending Continue medications  Keep follow up with Pulmonologist, will order CT chest now so he can have for his appointment next month.  Health Maintenance reviewed Diet and exercise encouraged  Follow up plan: Keep chronic follow up  [name], FNP

## 2022-11-18 NOTE — Patient Instructions (Signed)
Community-Acquired Pneumonia, Adult Pneumonia is a lung infection that causes inflammation and the buildup of mucus and fluids in the lungs. This may cause coughing and difficulty breathing. Community-acquired pneumonia is pneumonia that develops in people who are not, and have not recently been, in a hospital or other health care facility. Usually, pneumonia develops as a result of an illness that is caused by a virus, such as the common cold and the flu (influenza). It can also be caused by bacteria or fungi. While the common cold and influenza can pass from person to person (are contagious), pneumonia itself is not considered contagious. What are the causes? This condition may be caused by: Viruses. Bacteria. Fungi. What increases the risk? The following factors may make you more likely to develop this condition: Being over age 65 or having certain medical conditions, such as: A long-term (chronic) disease, such as: chronic obstructive pulmonary disease (COPD), asthma, heart failure, diabetes, or kidney disease. A condition that increases the risk of breathing in (aspirating) mucus and other fluids from your mouth and nose. A weakened body defense system (immune system). Having had your spleen removed (splenectomy). The spleen is the organ that helps fight germs and infections. Not cleaning your teeth and gums well (poor dental hygiene). Using tobacco products. Traveling to places where germs that cause pneumonia are present or being near certain animals or animal habitats that could have germs that cause pneumonia. What are the signs or symptoms? Symptoms of this condition include: A dry cough or a wet (productive) cough. A fever, sweating, or chills. Chest pain, especially when breathing deeply or coughing. Fast breathing, difficulty breathing, or shortness of breath. Tiredness (fatigue) and muscle aches. How is this diagnosed? This condition may be diagnosed based on your medical  history or a physical exam. You may also have tests, including: Imaging, such as a chest X-ray or lung ultrasound. Tests of: The level of oxygen and other gases in your blood. Mucus from your lungs (sputum). Fluid around your lungs (pleural fluid). Your urine. How is this treated? Treatment for this condition depends on many factors, such as the cause of your pneumonia, your medicines, and other medical conditions that you have. For most adults, pneumonia may be treated at home. In some cases, treatment must happen in a hospital and may include: Medicines that are given by mouth (orally) or through an IV, including: Antibiotic medicines, if bacteria caused the pneumonia. Medicines that kill viruses (antiviral medicines), if a virus caused the pneumonia. Oxygen therapy. Severe pneumonia, although rare, may require the following treatments: Mechanical ventilation.This procedure uses a machine to help you breathe if you cannot breathe well on your own or maintain a safe level of blood oxygen. Thoracentesis. This procedure removes any buildup of pleural fluid to help with breathing. Follow these instructions at home:  Medicines Take over-the-counter and prescription medicines only as told by your health care provider. Take cough medicine only if you have trouble sleeping. Cough medicine can prevent your body from removing mucus from your lungs. If you were prescribed antibiotics, take them as told by your health care provider. Do not stop taking the antibiotic even if you start to feel better. Lifestyle     Do not drink alcohol. Do not use any products that contain nicotine or tobacco. These products include cigarettes, chewing tobacco, and vaping devices, such as e-cigarettes. If you need help quitting, ask your health care provider. Eat a healthy diet. This includes plenty of vegetables, fruits, whole grains, low-fat   dairy products, and lean protein. General instructions Rest a lot and  get at least 8 hours of sleep each night. Sleep in a partly upright position at night. Place a few pillows under your head or sleep in a reclining chair. Return to your normal activities as told by your health care provider. Ask your health care provider what activities are safe for you. Drink enough fluid to keep your urine pale yellow. This helps to thin the mucus in your lungs. If your throat is sore, gargle with a mixture of salt and water 3-4 times a day or as needed. To make salt water, completely dissolve -1 tsp (3-6 g) of salt in 1 cup (237 mL) of warm water. Keep all follow-up visits. How is this prevented? You can lower your risk of developing community-acquired pneumonia by: Getting the pneumonia vaccine. There are different types and schedules of pneumonia vaccines. Ask your health care provider which option is best for you. Consider getting the pneumonia vaccine if: You are older than 60 years of age. You are 19-65 years of age and are receiving cancer treatment, have chronic lung disease, or have other medical conditions that affect your immune system. Ask your health care provider if this applies to you. Getting your influenza vaccine every year. Ask your health care provider which type of vaccine is best for you. Getting regular dental checkups. Washing your hands often with soap and water for at least 20 seconds. If soap and water are not available, use hand sanitizer. Contact a health care provider if: You have a fever. You have trouble sleeping because you cannot control your cough with cough medicine. Get help right away if: Your shortness of breath becomes worse. Your chest pain increases. Your sickness becomes worse, especially if you are an older adult or have a weak immune system. You cough up blood. These symptoms may be an emergency. Get help right away. Call 911. Do not wait to see if the symptoms will go away. Do not drive yourself to the  hospital. Summary Pneumonia is an infection of the lungs. Community-acquired pneumonia develops in people who have not been in the hospital. It can be caused by bacteria, viruses, or fungi. This condition may be treated with antibiotics or antiviral medicines. Severe pneumonia may require a hospital stay and treatment to help with breathing. This information is not intended to replace advice given to you by your health care provider. Make sure you discuss any questions you have with your health care provider. Document Revised: 09/15/2021 Document Reviewed: 09/15/2021 Elsevier Patient Education  2023 [employer]  

## 2022-11-19 LAB — CBC WITH DIFFERENTIAL/PLATELET
Basos: 0 %
Eos: 0 %
Hematocrit: 39.2 % (ref 37.5–51.0)
Immature Grans (Abs): 0.3 10*3/uL — ABNORMAL HIGH (ref 0.0–0.1)
Lymphocytes Absolute: 1.2 10*3/uL (ref 0.7–3.1)
MCH: 29.6 pg (ref 26.6–33.0)
MCV: 91 fL (ref 79–97)
Monocytes: 8 %
Platelets: 514 10*3/uL — ABNORMAL HIGH (ref 150–450)
RBC: 4.32 x10E6/uL (ref 4.14–5.80)
RDW: 12.8 % (ref 11.6–15.4)
WBC: 15.3 10*3/uL — ABNORMAL HIGH (ref 3.4–10.8)

## 2022-11-19 LAB — CMP14+EGFR
AST: 30 IU/L (ref 0–40)
Albumin: 3.5 g/dL — ABNORMAL LOW (ref 3.8–4.9)
Alkaline Phosphatase: 99 IU/L (ref 44–121)
BUN/Creatinine Ratio: 13 (ref 10–24)
Calcium: 9.1 mg/dL (ref 8.6–10.2)
Globulin, Total: 3 g/dL (ref 1.5–4.5)
Potassium: 4.1 mmol/L (ref 3.5–5.2)
Sodium: 146 mmol/L — ABNORMAL HIGH (ref 134–144)
Total Protein: 6.5 g/dL (ref 6.0–8.5)

## 2022-11-21 ENCOUNTER — Telehealth: Payer: Self-pay | Admitting: Family Medicine

## 2022-11-21 ENCOUNTER — Other Ambulatory Visit: Payer: Self-pay | Admitting: Family Medicine

## 2022-11-22 MED ORDER — IBUPROFEN 800 MG PO TABS: 800.0000 mg | ORAL_TABLET | Freq: Three times a day (TID) | ORAL | 0 refills | Status: DC | PRN

## 2022-11-22 NOTE — Progress Notes (Deleted)
Synopsis: Referred for mediastinal and hilar LAD, RLL mass like consolidation by Cleora Fleet, MD  Subjective:   PATIENT ID: Bradley Rice GENDER: male DOB: [birth year], MRN: [id number]  No chief complaint on file.  62yM with history of IPF, ALS, chronic hypoxemic respiratory failure on 5L O2 referred for mediastinal and hilar LAD, RLL mass like consolidation   Recent admission for CAP/post obstructive pna but concern for underlying RLL mass, hilar and mediastinal LAD.   Otherwise pertinent review of systems is negative.  Past Medical History:  Diagnosis Date   ALS (amyotrophic lateral sclerosis)    Asthma    Back pain    COPD (chronic obstructive pulmonary disease)    Coronary artery calcification seen on CT scan    DDD (degenerative disc disease), lumbar    Depression    Dysphagia    Following previous surgery   History of blood transfusion 2014   Post-op bleed   History of lung cancer    History of stroke 2015   Idiopathic pulmonary fibrosis    Migraine    Spider bite 2012     Family History  Problem Relation Age of Onset   ALS Mother    Cancer Mother    Cancer Sister    ALS Sister    Cancer Brother        1 brother   Anesthesia problems Neg Hx      Past Surgical History:  Procedure Laterality Date   ANTERIOR CERVICAL DECOMP/DISCECTOMY FUSION  12/22/2011   Procedure: ANTERIOR CERVICAL DECOMPRESSION/DISCECTOMY FUSION 3 LEVELS;  Surgeon: Venita Lick, MD;  Location: [facility] OR;  Service: Orthopedics;  Laterality: N/A;  ACDF C5-T1   ANTERIOR CERVICAL DECOMP/DISCECTOMY FUSION  06/25/2012   Procedure: ANTERIOR CERVICAL DECOMPRESSION/DISCECTOMY FUSION 1 LEVEL/HARDWARE REMOVAL;  Surgeon: Kerrin Champagne, MD;  Location: [facility] OR;  Service: Orthopedics;  Laterality: N/A;  Removal anterior cervical plate [id number], Explore Fusion, Left C5-6, C6-7, C7-T1 Re-do Foraminotomy, Left open carpal tunnel release with release of ulnar nerve at [facility], Posterior Cervical Fusion with  lateral mass screw   ANTERIOR CERVICAL DECOMP/DISCECTOMY FUSION N/A 03/25/2021   Procedure: ANTERIOR CERVICAL DECOMPRESSION/DISCECTOMY FUSION 2 LEVELS  C3-5;  Surgeon: Venita Lick, MD;  Location: [facility] OR;  Service: Orthopedics;  Laterality: N/A;  ANTERIOR CERVICAL DECOMPRESSION/DISCECTOMY FUSION 2 LEVELS  C3-5   BACK SURGERY     CARDIAC CATHETERIZATION     CARPAL TUNNEL RELEASE  06/25/2012   Procedure: CARPAL TUNNEL RELEASE;  Surgeon: Kerrin Champagne, MD;  Location: [facility] OR;  Service: Orthopedics;  Laterality: Left;  as above   CHOLECYSTECTOMY  2009   CORONARY ANGIOPLASTY  2001   [facility]   LUMBAR FUSION  2000   POSTERIOR CERVICAL FUSION/FORAMINOTOMY  06/25/2012   Procedure: POSTERIOR CERVICAL FUSION/FORAMINOTOMY LEVEL 1;  Surgeon: Kerrin Champagne, MD;  Location: [facility] OR;  Service: Orthopedics;  Laterality: N/A;  as above   POSTERIOR CERVICAL FUSION/FORAMINOTOMY N/A 12/17/2013   Procedure: REMOVAL OF POSTERIOR CERVICAL FUSION LATERAL MASS SCREWS AND RODS C5-T1, EXPLORE FUSION, LEFT SIX-SEVEN FORAMINOTOMY;  Surgeon: Kerrin Champagne, MD;  Location: [facility] OR;  Service: Orthopedics;  Laterality: N/A;   VASECTOMY  1990    Social History   Socioeconomic History   Marital status: Married    Spouse name: [name]   Number of children: 2   Years of education: 10th   Highest education level: 12th grade  Occupational History   Occupation: Disability    Employer: [employer]  Tobacco Use  Smoking status: Former    Packs/day: 1.00    Years: 30.00    Additional pack years: 0.00    Total pack years: 30.00    Types: Cigarettes    Quit date: 10/2015    Years since quitting: 7.0   Smokeless tobacco: Never  Vaping Use   Vaping Use: Never used  Substance and Sexual Activity   Alcohol use: No    Alcohol/week: 0.0 standard drinks of alcohol   Drug use: No   Sexual activity: Yes  Other Topics Concern   Not on file  Social History Narrative   Patient lives at home with his spouse.   Caffeine Use: coffee    Social Determinants of Health   Financial Resource Strain: Low Risk  (11/18/2022)   Overall Financial Resource Strain (CARDIA)    Difficulty of Paying Living Expenses: Not very hard  Food Insecurity: No Food Insecurity (11/18/2022)   Hunger Vital Sign    Worried About Running Out of Food in the Last Year: Never true    Ran Out of Food in the Last Year: Never true  Transportation Needs: No Transportation Needs (11/18/2022)   PRAPARE - Administrator, Civil Service (Medical): No    Lack of Transportation (Non-Medical): No  Physical Activity: Unknown (11/18/2022)   Exercise Vital Sign    Days of Exercise per Week: Patient declined    Minutes of Exercise per Session: Not on file  Stress: Stress Concern Present (11/18/2022)   [employer] of Occupational Health - Occupational Stress Questionnaire    Feeling of Stress : Very much  Social Connections: Unknown (11/18/2022)   Social Connection and Isolation Panel [NHANES]    Frequency of Communication with Friends and Family: More than three times a week    Frequency of Social Gatherings with Friends and Family: Three times a week    Attends Religious Services: Patient declined    Active Member of Clubs or Organizations: No    Attends Banker Meetings: Not on file    Marital Status: Married  Intimate Partner Violence: Not At Risk (11/09/2022)   Humiliation, Afraid, Rape, and Kick questionnaire    Fear of Current or Ex-Partner: No    Emotionally Abused: No    Physically Abused: No    Sexually Abused: No     Allergies  Allergen Reactions   Pirfenidone Nausea And Vomiting and Other (See Comments)    Weight loss     Outpatient Medications Prior to Visit  Medication Sig Dispense Refill   ibuprofen (ADVIL) 800 MG tablet Take 1 tablet (800 mg total) by mouth every 8 (eight) hours as needed. 30 tablet 0   albuterol (PROVENTIL) (2.5 MG/3ML) 0.083% nebulizer solution Take 3 mLs (2.5 mg total) by nebulization every  6 (six) hours as needed for wheezing or shortness of breath. 150 mL 1   albuterol (VENTOLIN HFA) 108 (90 Base) MCG/ACT inhaler Inhale 2 puffs into the lungs every 6 (six) hours as needed for wheezing or shortness of breath. 1 each 5   benzonatate (TESSALON) 200 MG capsule Take 1 capsule (200 mg total) by mouth 3 (three) times daily as needed. 90 capsule 2   blood glucose meter kit and supplies Dispense based on patient and insurance preference. Use up to four times daily as directed. (FOR ICD-10 E10.9, E11.9). 1 each 0   busPIRone (BUSPAR) 5 MG tablet Take 1 tablet (5 mg total) by mouth 3 (three) times daily. 90 tablet 1   DULoxetine (  CYMBALTA) 60 MG capsule Take 1 capsule (60 mg total) by mouth daily. 90 capsule 1   Ensure Max Protein (ENSURE MAX PROTEIN) LIQD Take 330 mLs by mouth 3 (three) times daily. 29700 mL 2   fexofenadine (ALLEGRA) 180 MG tablet Take 1 tablet (180 mg total) by mouth daily. 90 tablet 1   magic mouthwash w/lidocaine SOLN Take 10 mLs by mouth 3 (three) times daily as needed for mouth pain. 250 mL 1   methocarbamol (ROBAXIN) 500 MG tablet Take 1 tablet (500 mg total) by mouth 2 (two) times daily as needed for muscle spasms. 20 tablet 0   metoprolol tartrate (LOPRESSOR) 50 MG tablet Take 0.5 tablets (25 mg total) by mouth 2 (two) times daily. 60 tablet 1   naloxone (NARCAN) nasal spray 4 mg/0.1 mL Place 1 spray into the nose as needed (opioid overdose).     Nintedanib (OFEV) 150 MG CAPS Take 1 capsule (150 mg total) by mouth 2 (two) times daily. 180 capsule 3   nortriptyline (PAMELOR) 50 MG capsule Take 50 mg by mouth 2 (two) times daily.     oxyCODONE (ROXICODONE) 15 MG immediate release tablet Take 15 mg by mouth 4 (four) times daily.  0   oxyCODONE-acetaminophen (PERCOCET) 10-325 MG tablet Take 1 tablet by mouth 3 (three) times daily as needed. 90 tablet 0   sildenafil (REVATIO) 20 MG tablet Take by mouth.     topiramate (TOPAMAX) 50 MG tablet Take by mouth.     traZODone  (DESYREL) 50 MG tablet Take 2 tablets (100 mg total) by mouth at bedtime.     No facility-administered medications prior to visit.       Objective:   Physical Exam:  General appearance: 63 y.o., male, NAD, conversant  Eyes: anicteric sclerae; PERRL, tracking appropriately HENT: NCAT; MMM Neck: Trachea midline; no lymphadenopathy, no JVD Lungs: CTAB, no crackles, no wheeze, with normal respiratory effort CV: RRR, no murmur  Abdomen: Soft, non-tender; non-distended, BS present  Extremities: No peripheral edema, warm Skin: Normal turgor and texture; no rash Psych: Appropriate affect Neuro: Alert and oriented to person and place, no focal deficit     There were no vitals filed for this visit.   on *** LPM *** RA BMI Readings from Last 3 Encounters:  11/18/22 17.41 kg/m  11/09/22 18.25 kg/m  09/01/22 20.03 kg/m   Wt Readings from Last 3 Encounters:  11/18/22 143 lb (64.9 kg)  11/09/22 149 lb 14.6 oz (68 kg)  09/01/22 151 lb 12.8 oz (68.9 kg)     CBC    Component Value Date/Time   WBC 15.3 (H) 11/18/2022 1226   WBC 24.8 (H) 11/10/2022 0441   RBC 4.32 11/18/2022 1226   RBC 3.77 (L) 11/10/2022 0441   HGB 12.8 (L) 11/18/2022 1226   HCT 39.2 11/18/2022 1226   PLT 514 (H) 11/18/2022 1226   MCV 91 11/18/2022 1226   MCH 29.6 11/18/2022 1226   MCH 30.2 11/10/2022 0441   MCHC 32.7 11/18/2022 1226   MCHC 30.9 11/10/2022 0441   RDW 12.8 11/18/2022 1226   LYMPHSABS 1.2 11/18/2022 1226   MONOABS 1.2 (H) 11/08/2022 1845   EOSABS 0.0 11/18/2022 1226   BASOSABS 0.0 11/18/2022 1226    ***  Chest Imaging: CTA Chest with R hilar and marked mediastinal LAD, RLL mass like consolidation, R pleural effusion  Pulmonary Functions Testing Results:    Latest Ref Rng & Units 12/31/2019    9:29 AM 05/30/2019   10:38 AM  PFT Results  FVC-Pre L 3.15  3.30   FVC-Predicted Pre % 61  64   Pre FEV1/FVC % % 87  77   FEV1-Pre L 2.74  2.55   FEV1-Predicted Pre % 70  64   DLCO  uncorrected ml/min/mmHg 18.33  12.85   DLCO UNC% % 62  43   DLVA Predicted % 77  73      Echocardiogram 08/29/19:    1. Left ventricular ejection fraction, by visual estimation, is 60 to  65%. The left ventricle has normal function. There is no left ventricular  hypertrophy.   2. The left ventricle has no regional wall motion abnormalities.   3. Global right ventricle has normal systolic function.The right  ventricular size is normal. No increase in right ventricular wall  thickness.   4. Left atrial size was normal.   5. Right atrial size was normal.   6. The mitral valve is normal in structure. No evidence of mitral valve  regurgitation. No evidence of mitral stenosis.   7. The tricuspid valve is normal in structure.   8. The tricuspid valve is normal in structure. Tricuspid valve  regurgitation is not demonstrated.   9. The aortic valve is tricuspid. Aortic valve regurgitation is not  visualized. No evidence of aortic valve sclerosis or stenosis.  10. The pulmonic valve was not well visualized. Pulmonic valve  regurgitation is not visualized.  11. Normal pulmonary artery systolic pressure.  12. The inferior vena cava is normal in size with greater than 50%  respiratory variability, suggesting right atrial pressure of 3 mmHg.   NM stress 2021: No diagnostic ST segment changes to indicate ischemia. No significant myocardial perfusion defects to indicate scar or ischemia. This is a low risk study. Nuclear stress EF: 60%.     Assessment & Plan:    Plan:      Omar Person, MD [facility] 11/22/2022 4:09 PM

## 2022-11-22 NOTE — Telephone Encounter (Signed)
Prescription sent to pharmacy.

## 2022-11-24 ENCOUNTER — Ambulatory Visit (HOSPITAL_BASED_OUTPATIENT_CLINIC_OR_DEPARTMENT_OTHER)
Admission: RE | Admit: 2022-11-24 | Discharge: 2022-11-24 | Disposition: A | Discharge status: Home / Self Care | Payer: Medicaid Other | Source: Ambulatory Visit | Attending: Family | Admitting: Family

## 2022-11-24 ENCOUNTER — Ambulatory Visit: Payer: Medicaid Other | Admitting: Primary Care

## 2022-11-24 ENCOUNTER — Telehealth: Payer: Self-pay

## 2022-11-24 ENCOUNTER — Institutional Professional Consult (permissible substitution): Payer: Medicaid Other | Admitting: Student

## 2022-11-24 ENCOUNTER — Encounter: Payer: Self-pay | Admitting: Primary Care

## 2022-11-24 VITALS — BP 132/78 | HR 83 | Temp 98.1°F | Ht 72.0 in | Wt 139.2 lb

## 2022-11-24 DIAGNOSIS — J849 Interstitial pulmonary disease, unspecified: Secondary | ICD-10-CM

## 2022-11-24 DIAGNOSIS — J189 Pneumonia, unspecified organism: Secondary | ICD-10-CM | POA: Diagnosis not present

## 2022-11-24 DIAGNOSIS — J9621 Acute and chronic respiratory failure with hypoxia: Secondary | ICD-10-CM | POA: Diagnosis not present

## 2022-11-24 DIAGNOSIS — J439 Emphysema, unspecified: Secondary | ICD-10-CM | POA: Insufficient documentation

## 2022-11-24 DIAGNOSIS — R918 Other nonspecific abnormal finding of lung field: Secondary | ICD-10-CM | POA: Diagnosis not present

## 2022-11-24 DIAGNOSIS — J84112 Idiopathic pulmonary fibrosis: Secondary | ICD-10-CM

## 2022-11-24 DIAGNOSIS — J90 Pleural effusion, not elsewhere classified: Secondary | ICD-10-CM | POA: Diagnosis not present

## 2022-11-24 DIAGNOSIS — R911 Solitary pulmonary nodule: Secondary | ICD-10-CM | POA: Diagnosis not present

## 2022-11-24 MED ORDER — ALBUTEROL SULFATE HFA 108 (90 BASE) MCG/ACT IN AERS: 2.0000 | INHALATION_SPRAY | Freq: Four times a day (QID) | RESPIRATORY_TRACT | 5 refills | Status: DC | PRN

## 2022-11-24 MED ORDER — ALBUTEROL SULFATE (2.5 MG/3ML) 0.083% IN NEBU: 2.5000 mg | INHALATION_SOLUTION | Freq: Four times a day (QID) | RESPIRATORY_TRACT | 1 refills | Status: DC | PRN

## 2022-11-24 MED ORDER — IOHEXOL 300 MG/ML  SOLN
100.0000 mL | Freq: Once | INTRAMUSCULAR | Status: AC | PRN
  Administered 2022-11-24: 60 mL via INTRAVENOUS

## 2022-11-24 NOTE — Telephone Encounter (Signed)
Patient here for OV with Beth.  Patient states he needs refill on OFEV.  Please advise on status for Beth.

## 2022-11-24 NOTE — Telephone Encounter (Signed)
Per dispense history, patient has not filled Ofev since August 2022. We have not sent a prescritpion since March 2022

## 2022-11-24 NOTE — Patient Instructions (Addendum)
Recommendations: - Resume OFEV - Use Albuterol hfa/nebulizer every 4-6 hours as needed for shortness of breath/wheezing - Use oxygen with exertion and at night to maintain O2 > 88-90%  - Look into applying for disability or family medical leave with your work   Orders: - Pulmonary function testing - Walk on POC and forehead probe   Follow-up: - Please schedule visit the second week in July with Dr. Debroah Baller with 1 hour PFT prior (30 min visit-ILD)

## 2022-11-24 NOTE — Progress Notes (Addendum)
@Patient  ID: Bradley Rice, male    DOB: Feb 14, 1961, 62 y.o.   MRN: [id number]  Chief Complaint  Patient presents with   Hospitalization Follow-up    Very SOB. Exertion makes SOB/dyspnea worse.  SaO2 in 70's.  Needs refill on Ofev and albuterol for nebulizer.    Referring provider: Cleora Fleet, MD  HPI: 62 year old male former smoker followed in our office for interstitial lung disease  Past medical history: Multilevel spondylosis, TIA, postlaminectomy syndrome, anxiety, depression, hypertension, chronic pain, ALS Smoking history: Former smoker Maintenance: Scientist, forensic, Symbicort 160, OFEV Patient of Dr. Marchelle Rice  Previous [facility] pulmonary encounter:  Chief complaint: 1 month follow-up, short of breath, dizzy and lightheaded  62 year old male followed in our office for IPF.  He is completing 1 month follow-up from June/08/2019 visit with Dr. Marchelle Rice.  Patient reports that he has an upcoming appointment with [facility] Rice transplant team on 02/14/2020.  Patient reports adherence to Spiriva HandiHaler and Symbicort 160.  Patient reports that he is adherent to taking Ofev.  He is tolerating this well.  Initially when starting Ofev he had aspects of diarrhea but he reports that he is now tolerating without any issues.  He is not having any current headaches.  Patient is struggling with obtaining oxygen for management of exertional hypoxemia.  Patient reports that his oxygen levels dropped to 80 and 85 when on room air with physical exertion.  At rest today on room air patient's oxygen levels were 92%.  With exertion he dropped to 84%.  This was a telephonic visit.  Patient does not have portable oxygen.  He occasionally uses his mother's portable oxygen in order to make it to office visits.  His DME company is [employer].  He reports that they are aware of this.  We will follow up on this emergently today.  Lab work was ordered to further investigate his hypoxemia.  Patient reports  that he presented to [facility] as instructed to obtain this in [place] so there were no records of it.  We will follow up on this today.  Patient will be in [place] on Thursday this week and could obtain the blood work in our office.   12/31/2019 -   Chief Complaint  Patient presents with   Follow-up    PFT.  sob all the time.  HA, had CT scan done.  off Ofev for 1 week.   Follow-up IPF [diagnosis given January 2020, clinical diagnosis, based on male gender, clubbing, family history, smoking history, negative serology and associated emphysema] with associated emphysema in the setting of associated ALS.  Delays with Esbriet start in early 2020. Restart esbriet 06/18/2019 or 06/19/2019. Stopped Janm 2021 due to GI issues.  Restart nintedanib February 2021 but stopped in May 2021 because of headache that was unrelated.  Restarting nintedanib again in June 2021.  Known associated emphysema with Spiriva and Symbicort  Coronary artery calcification seen on CT scan March 2020. -Low risk nuclear medicine stress test Jan 2021  Lung nodules  =- Lungs/Pleura: No pleural fluid. Redemonstration of basilar and peripheral predominant subpleural reticulation with traction bronchiectasis, architectural distortion, and areas of mild honeycombing. Concurrent centrilobular and paraseptal emphysema. Multiple bilateral pulmonary nodules. Those nodules measure greater than 6 mm are similar on the 10/16/2018 diagnostic CT. For example a left upper lobe nodule of volume derived equivalent diameter 9.0 mm is not significantly changed.  HPI Bradley Rice 62 y.o. -presents for follow-up.  Last seen in February 2021  by myself.  At that time instituted nintedanib but then he called back had a televisit with nurse practitioner.  He was reporting headaches after starting nintedanib.  He stopped the nintedanib with the headaches did not resolve.  He went and saw the EDP for the headaches..  Nevertheless he was  reassured.  However the headache still has not resolved.  He feels it is because of his glasses.  He has never seen a neurologist because of the headaches.  In terms of his ILD his symptoms are worse.  He is off the nintedanib because the there was concern the headache was because of the nintedanib.  He says he never had any GI side effects with the nintedanib and is willing to restart the nintedanib.  He feels he is more short of breath.  Is not able to do things with the grandkids that he used to do even a few months ago.  He says when he checks his pulse ox at home with exertion it dropped to the 70s.  He feels a little bit fatigue does not want to walk today but he finally agreed for a simple walk test.  When we walked him 185 feet x 3 laps on room air he did not drop below 94%.  He still wants a portable oxygen.  He continues to use his overnight oxygen study.  He is interested in a transplant referral because of his young age, lean body mass index and also progressive symptoms.  He did have a nuclear medicine stress test earlier this year this was normal.  He did have a follow-up low-dose CT scan of the chest for lung nodules and his lung nodules are stable in the last 1 year.  I am not so sure that he is seen the genetics counselor attended pulmonary rehabilitation as yet.  03/10/2021 Patient presents today for surgical risk assessment. Planning for upcoming anterior cervical decompression/disectomy fusion 2 levels C3-5 which has been scheduled for August 25th with Dr. Shon Rice. Patient is followed by Dr. Marchelle Rice for IPF, he was last seen in our office on 01/28/20.   He feels well, he has no acute complaints today. Breathing has been stable. Maintained on OFEV 150mg  twice a day.He wears 2L oxygen at night. He has a dry cough in the morning and some post nasal drip symptoms. He has no significant shortness of breath at rest or with ADL. He experiences dyspnea in the heat or with long walks.  He had CXR in  April 2022 that showed similar appearance of fibrotic lung changes without definite evidence of superimposed acute cardiopulmonary disease   11/24/2022 - Interim hx  Patient presents today for hospital follow-up.  Hx pulmonary fibrosis on 2.5L oxygen at home and ALS Admitted 11/08/22-11/11/22 for acute on chronic respiratory failure in setting of CAP Presented to ED with productive cough and worsening shortness of breath Increased oxygen requirements from 2.5L to 4L CXR suggestive of multifocal pneumonia, possible right mid- lower lung mass vs infection/inflammation. CTA completed showing patchy airspace disease in lungs bilaterally with masslike consolidation RLL,  started on ceftriaxone and azithromycin Continue Proventil and OFEV  Discharged on levaquin 750mg  daily x 5 days and prednisone taper Right loculated pleural effusion, not tapped or given diuretics d/t hypotension. CXR on 4.11 shows small effusion only. Repeat CT outpatient. Consider thoracentesis if symptoms do not improve.   He had CT chest to fu right lung mass this morning, results are pending He ran out of OFEV 2 months  ago, needs prescription refill  He ran out of Albuterol as well, needs refill He is concerned about getting terminated from work due to point system and doctors appointments He is out of oxygen tanks. Needs POC. Yesterday O2 dropped to 70s on room air.  DME is [employer]    SYMPTOM SCALE - ILD 05/30/2019  06/25/2019  12/31/2019 Off ofev - 150# 03/10/2021  11/24/2022   O2 use ra   RA  RA  Shortness of Breath 0 -> 5 scale with 5 being worst (score 6 If unable to do)      At rest 4 5 5  0 2  Simple tasks - showers, clothes change, eating, shaving 4 3 5  0 5  Household (dishes, doing bed, laundry) 4 3 6  0 6  Shopping 5 5 6  0 6  Walking level at own pace 5 4 6 1 6   Walking up Stairs 5 5  3 6   Total (40 - 48) Dyspnea Score 37 34 28 4 31   How bad is your cough? 5 4 5  4/ morning only 5  How bad is your  fatigue 4 4 0 0 5  nauesa   0 0 0  vomit   0 0 0  dirahee   0 0 0  anxiety   0 0 0  depression   0 0 0    Simple office walk 185 feet x  3 laps goal with forehead probe 08/23/2018  05/30/2019  06/25/2019  12/31/2019   03/10/2021  11/24/2022   O2 used Room air Room air RA  RA RA  Number laps completed 3 3 6wmd  3   Comments about pace Slow pace* steady 7 laps  Regular pace   Resting Pulse Ox/HR 99% and 93/min 98% and HR 96/min   98% RA; HR 83   Final Pulse Ox/HR 98% and 110/min 98% and HR 105/mn  Did not drop below 94% on simple walk test 93% RA; HR 100   Desaturated </= 88% no no   No    Desaturated <= 3% points no no   Yes   Got Tachycardic >/= 90/min yes Yes even at rest   Yes   Symptoms at end of test Moderate dyspnea, lightheaded moderate Did ntt drop pulse ox . sTiooed due to dyspnea  No dyspnea   Miscellaneous comments  stable   Stable           Allergies  Allergen Reactions   Pirfenidone Nausea And Vomiting and Other (See Comments)    Weight loss    Immunization History  Administered Date(s) Administered   Influenza Split 06/26/2012   Influenza,inj,Quad PF,6+ Mos 08/24/2015, 08/25/2016, 04/30/2018, 05/18/2021, 09/01/2022   Influenza-Unspecified 06/26/2012, 08/24/2015, 08/25/2016, 05/21/2019   Moderna Covid-19 Vaccine Bivalent Booster 57yrs & up 06/13/2022   Moderna Sars-Covid-2 Vaccination 10/31/2019, 12/03/2019, 09/10/2020   Pneumococcal Polysaccharide-23 06/25/2019, 02/22/2021   Tdap 06/01/2013    Past Medical History:  Diagnosis Date   ALS (amyotrophic lateral sclerosis) (HCC)    Asthma    Back pain    COPD (chronic obstructive pulmonary disease) (HCC)    Coronary artery calcification seen on CT scan    DDD (degenerative disc disease), lumbar    Depression    Dysphagia    Following previous surgery   History of blood transfusion 2014   Post-op bleed   History of lung cancer    History of stroke 2015   Idiopathic pulmonary fibrosis (HCC)     Migraine  Port-A-Cath in place 12/15/2022   Spider bite 2012    Tobacco History: Social History   Tobacco Use  Smoking Status Former   Packs/day: 1.00   Years: 30.00   Additional pack years: 0.00   Total pack years: 30.00   Types: Cigarettes   Quit date: 10/2015   Years since quitting: 7.1   Passive exposure: Current  Smokeless Tobacco Never  Tobacco Comments   Patient denies current smoking.  Patient lives with wife who is a smoker. Updated 11/24/2022. am   Counseling given: Not Answered Tobacco comments: Patient denies current smoking.  Patient lives with wife who is a smoker. Updated 11/24/2022. am   Outpatient Medications Prior to Visit  Medication Sig Dispense Refill   benzonatate (TESSALON) 200 MG capsule Take 1 capsule (200 mg total) by mouth 3 (three) times daily as needed. (Patient taking differently: Take 200 mg by mouth 3 (three) times daily as needed for cough.) 90 capsule 2   blood glucose meter kit and supplies Dispense based on patient and insurance preference. Use up to four times daily as directed. (FOR ICD-10 E10.9, E11.9). 1 each 0   busPIRone (BUSPAR) 5 MG tablet Take 1 tablet (5 mg total) by mouth 3 (three) times daily. (Patient not taking: Reported on 12/23/2022) 90 tablet 1   DULoxetine (CYMBALTA) 60 MG capsule Take 1 capsule (60 mg total) by mouth daily. 90 capsule 1   Ensure Max Protein (ENSURE MAX PROTEIN) LIQD Take 330 mLs by mouth 3 (three) times daily. 29700 mL 2   fexofenadine (ALLEGRA) 180 MG tablet Take 1 tablet (180 mg total) by mouth daily. (Patient not taking: Reported on 12/23/2022) 90 tablet 1   ibuprofen (ADVIL) 800 MG tablet Take 1 tablet (800 mg total) by mouth every 8 (eight) hours as needed. 30 tablet 0   magic mouthwash w/lidocaine SOLN Take 10 mLs by mouth 3 (three) times daily as needed for mouth pain. (Patient not taking: Reported on 12/23/2022) 250 mL 1   metoprolol tartrate (LOPRESSOR) 50 MG tablet Take 0.5 tablets (25 mg total) by  mouth 2 (two) times daily. 60 tablet 1   naloxone (NARCAN) nasal spray 4 mg/0.1 mL Place 1 spray into the nose as needed (opioid overdose).     nortriptyline (PAMELOR) 50 MG capsule Take 50 mg by mouth 2 (two) times daily. (Patient not taking: Reported on 12/23/2022)     traZODone (DESYREL) 50 MG tablet Take 2 tablets (100 mg total) by mouth at bedtime. (Patient taking differently: Take 100 mg by mouth at bedtime as needed for sleep.)     albuterol (PROVENTIL) (2.5 MG/3ML) 0.083% nebulizer solution Take 3 mLs (2.5 mg total) by nebulization every 6 (six) hours as needed for wheezing or shortness of breath. 150 mL 1   albuterol (VENTOLIN HFA) 108 (90 Base) MCG/ACT inhaler Inhale 2 puffs into the lungs every 6 (six) hours as needed for wheezing or shortness of breath. 1 each 5   methocarbamol (ROBAXIN) 500 MG tablet Take 1 tablet (500 mg total) by mouth 2 (two) times daily as needed for muscle spasms. 20 tablet 0   oxyCODONE-acetaminophen (PERCOCET) 10-325 MG tablet Take 1 tablet by mouth 3 (three) times daily as needed. 90 tablet 0   sildenafil (REVATIO) 20 MG tablet Take by mouth.     topiramate (TOPAMAX) 50 MG tablet Take by mouth.     oxyCODONE (ROXICODONE) 15 MG immediate release tablet Take 15 mg by mouth 4 (four) times daily.  0  Nintedanib (OFEV) 150 MG CAPS Take 1 capsule (150 mg total) by mouth 2 (two) times daily. (Patient not taking: Reported on 11/24/2022) 180 capsule 3   No facility-administered medications prior to visit.    Review of Systems  Review of Systems  Constitutional:  Positive for fatigue.  Respiratory:  Positive for cough and shortness of breath.      Physical Exam  BP 132/78 (BP Location: Right Arm, Patient Position: Sitting, Cuff Size: Normal)   Pulse 83   Temp 98.1 F (36.7 C) (Oral)   Ht 6' (1.829 m)   Wt 139 lb 3.2 oz (63.1 kg)   SpO2 97% Comment: 83% on arrival - room air.  Patient started on 5 L oxygen continuous.  SaO2 97%  BMI 18.88 kg/m  Physical  Exam Constitutional:      General: He is not in acute distress.    Appearance: Normal appearance.  Cardiovascular:     Rate and Rhythm: Normal rate.  Pulmonary:     Effort: Pulmonary effort is normal.     Breath sounds: Rales present.  Skin:    General: Skin is warm and dry.  Neurological:     General: No focal deficit present.     Mental Status: He is alert and oriented to person, place, and time. Mental status is at baseline.  Psychiatric:        Mood and Affect: Mood normal.        Behavior: Behavior normal.        Thought Content: Thought content normal.        Judgment: Judgment normal.      Lab Results:  CBC    Component Value Date/Time   WBC 10.9 (H) 12/27/2022 0420   RBC 4.07 (L) 12/27/2022 0420   HGB 11.8 (L) 12/27/2022 0420   HGB 12.8 (L) 11/18/2022 1226   HCT 38.1 (L) 12/27/2022 0420   HCT 39.2 11/18/2022 1226   PLT 220 12/27/2022 0420   PLT 514 (H) 11/18/2022 1226   MCV 93.6 12/27/2022 0420   MCV 91 11/18/2022 1226   MCH 29.0 12/27/2022 0420   MCHC 31.0 12/27/2022 0420   RDW 13.5 12/27/2022 0420   RDW 12.8 11/18/2022 1226   LYMPHSABS 1.1 12/23/2022 1021   LYMPHSABS 1.2 11/18/2022 1226   MONOABS 0.2 12/23/2022 1021   EOSABS 0.1 12/23/2022 1021   EOSABS 0.0 11/18/2022 1226   BASOSABS 0.1 12/23/2022 1021   BASOSABS 0.0 11/18/2022 1226    BMET    Component Value Date/Time   NA 134 (L) 12/27/2022 0420   NA 146 (H) 11/18/2022 1226   K 4.4 12/27/2022 0420   CL 96 (L) 12/27/2022 0420   CO2 32 12/27/2022 0420   GLUCOSE 102 (H) 12/27/2022 0420   BUN 26 (H) 12/27/2022 0420   BUN 11 11/18/2022 1226   CREATININE 0.71 12/27/2022 0420   CALCIUM 9.1 12/27/2022 0420   GFRNONAA >60 12/27/2022 0420   GFRAA >60 12/15/2019 2328    BNP    Component Value Date/Time   BNP 39.0 12/23/2022 1021    ProBNP    Component Value Date/Time   PROBNP 30.0 01/30/2020 1014    Imaging: DG CHEST PORT 1 VIEW  Result Date: 12/27/2022 CLINICAL DATA:  Left  pneumothorax EXAM: PORTABLE CHEST 1 VIEW COMPARISON:  Previous studies including the examination of 12/26/2022 FINDINGS: There is a interval decrease in size of left pneumothorax with small residual left pneumothorax in the periphery of left upper lung field. Left chest  tube is noted in place. Transverse diameter of heart is increased. Increased interstitial markings are seen in both lungs. There are patchy densities in right mid and right lower lung fields. Right hilum is more prominent than left. There is blunting of lateral CP angles. Tip of left IJ chest port is seen at the junction of superior vena cava and right atrium. IMPRESSION: There is interval decrease in size of pneumothorax in the periphery of left upper lung field. Electronically Signed   By: Ernie Avena M.D.   On: 12/27/2022 09:16   DG CHEST PORT 1 VIEW  Result Date: 12/26/2022 CLINICAL DATA:  [id number] Chest tube in place [id number] EXAM: PORTABLE CHEST - 1 VIEW COMPARISON:  Earlier film of the same day FINDINGS: Stable left pigtail chest tube directed towards the hilum. Small scratch the apical and lateral a pneumothorax slightly more conspicuous, the lung apex projecting below the posterior aspect left third rib. Stable right IJ port catheter to the mid right atrium. Masslike opacity at the right lung base stable. Diffuse interstitial lung disease stable. Heart size normal. Aortic Atherosclerosis (ICD10-170.0). Right hilar and paratracheal fullness suggesting adenopathy. No effusion. Visualized bones unremarkable. IMPRESSION: 1. Small left apical and lateral pneumothorax, with chest tube in place. 2. Stable right lower lobe mass, and hilar adenopathy. Electronically Signed   By: Corlis Leak M.D.   On: 12/26/2022 20:33   DG CHEST PORT 1 VIEW  Result Date: 12/26/2022 CLINICAL DATA:  Chest tube in place. EXAM: PORTABLE CHEST 1 VIEW COMPARISON:  Radiograph yesterday FINDINGS: Decreased size of left pneumothorax, lateral component no longer  visualized. Small residual at the apex. Left pigtail catheter remains in place. Left chest port remains in place. Otherwise unchanged appearance of the chest with chronic lung disease and right-sided thoracic malignancy. IMPRESSION: Decreased size of left pneumothorax, small residual. Left pigtail catheter remains in place. Electronically Signed   By: Narda Rutherford M.D.   On: 12/26/2022 13:00   DG CHEST PORT 1 VIEW  Result Date: 12/25/2022 CLINICAL DATA:  Chest tube clamped. EXAM: PORTABLE CHEST 1 VIEW COMPARISON:  Earlier today. FINDINGS: Increased size of left pneumothorax, small to moderate, with increasing lateral component. For example lateral component measures 11 mm at the level of the port, previously 4 mm. Pigtail catheter in the left mid lung again seen. The exam is otherwise unchanged IMPRESSION: Increased size of left pneumothorax, small to moderate, with increasing lateral component. Pigtail catheter remains in place. Electronically Signed   By: Narda Rutherford M.D.   On: 12/25/2022 20:49   DG CHEST PORT 1 VIEW  Result Date: 12/25/2022 CLINICAL DATA:  Chest tube in place, on water seal. EXAM: PORTABLE CHEST 1 VIEW COMPARISON:  Earlier today FINDINGS: Slight increased size of left pneumothorax with chest tube on water seal, still small, however now visualized laterally. Pigtail catheter in place unchanged in positioning. Left chest port remains in place. Chronic lung disease and known right lung malignancy are unchanged. IMPRESSION: Slight increased size of left pneumothorax with chest tube on water seal, still small, however now visualized laterally. Left pigtail catheter in place. Electronically Signed   By: Narda Rutherford M.D.   On: 12/25/2022 20:47   DG CHEST PORT 1 VIEW  Result Date: 12/25/2022 CLINICAL DATA:  Chest tube in place, no pneumothorax. EXAM: PORTABLE CHEST 1 VIEW COMPARISON:  Most recent radiograph yesterday. FINDINGS: Unchanged small left apical pneumothorax from  yesterday. Left-sided pigtail catheter remains in place. Left chest port in place. The exam is  otherwise unchanged with sequela of right lung malignancy and chronic lung disease. IMPRESSION: 1. Unchanged small left apical pneumothorax with left chest tube in place. 2. Unchanged right lung malignancy and chronic lung disease. Electronically Signed   By: Narda Rutherford M.D.   On: 12/25/2022 10:31   DG Chest Port 1 View  Result Date: 12/24/2022 CLINICAL DATA:  Pneumothorax. EXAM: PORTABLE CHEST 1 VIEW COMPARISON:  Radiograph yesterday FINDINGS: Left-sided pigtail catheter remains in place. Stable size of small left apical pneumothorax. Left chest port remains in place. Known right lung malignancy and chronic lung disease are otherwise unchanged. IMPRESSION: 1. Unchanged small left apical pneumothorax. Left chest tube remains in place. 2. Unchanged right lung malignancy and chronic lung disease. Electronically Signed   By: Narda Rutherford M.D.   On: 12/24/2022 11:16   DG Chest Port 1 View  Result Date: 12/23/2022 CLINICAL DATA:  Shortness of breath. EXAM: PORTABLE CHEST 1 VIEW COMPARISON:  Chest x-ray Dec 23, 2022. FINDINGS: Left chest tube in place with decreased size of the left pneumothorax, now small. Similar patchy interstitial airspace opacities. No new masses better characterized on recent CT chest. No mediastinal shift. Otherwise, cardiomediastinal silhouette is similar. Left subclavian approach Port-A-Cath with tip in the right atrium. IMPRESSION: Left chest tube in place with decreased size of the left pneumothorax, now small. Electronically Signed   By: Feliberto Harts M.D.   On: 12/23/2022 11:41   DG Chest Port 1 View  Result Date: 12/23/2022 CLINICAL DATA:  sob EXAM: PORTABLE CHEST 1 VIEW COMPARISON:  November 10, 2022. FINDINGS: Large left pneumothorax. Some rightward mediastinal shift. Otherwise, cardiomediastinal silhouette is similar. Left subclavian approach cardiac rhythm maintenance  device with the tip projecting at the right atrium. Patchy interstitial and airspace opacities throughout the lungs. Known masses better characterized on recent CT chest. IMPRESSION: 1. Large left pneumothorax. Some rightward mediastinal shift raises concern for tension physiology component. 2. Known masses better characterized on recent CT chest. 3. Additional patchy interstitial and airspace opacities throughout the lungs could represent changes of emphysema but superimposed infection or aspiration is difficult to exclude. Findings discussed with Dr. Charm Barges via telephone at 10:26 AM. Electronically Signed   By: Feliberto Harts M.D.   On: 12/23/2022 10:28   MR Brain W Wo Contrast  Result Date: 12/16/2022 CLINICAL DATA:  Non-small cell lung cancer, staging a EXAM: MRI HEAD WITHOUT AND WITH CONTRAST TECHNIQUE: Multiplanar, multiecho pulse sequences of the brain and surrounding structures were obtained without and with intravenous contrast. CONTRAST:  6mL GADAVIST GADOBUTROL 1 MMOL/ML IV SOLN COMPARISON:  04/13/2017 FINDINGS: Evaluation is somewhat limited by motion artifact. Brain: Linear area of restricted diffusion in the posterior right cerebellum, which measures up to 18 x 6 x 8 mm (AP x TR x CC). Additional smaller areas of restricted diffusion with ADC correlates are noted in the bilateral cerebellar hemispheres (series 5, images 64 and 66) and left paramedian pons (series 5, image 64), which are T2 hyperintense and nonenhancing, concerning for acute infarcts. Additional punctate foci of increased diffusivity without definite ADC correlate in the right posterior frontal white matter (series 5, image 88), the left occipital lobe (series 5, image 80), and right caudate tail (series 5, image 81), concerning for subacute infarcts. Diffusion restricting focus with ADC correlate in the anteromedial right frontal cortex (series 5, image 81) correlates with possible contrast enhancement (series 19, image 26 and  series 18, image 32), which could represent metastatic disease. No significant associated T2 hyperintense signal.  No other abnormal parenchymal enhancement. No acute hemorrhage, mass, mass effect, or midline shift. No hydrocephalus or extra-axial collection. Normal pituitary and craniocervical junction. No hemosiderin deposition to suggest remote hemorrhage. Normal cerebral volume for age. Scattered T2 hyperintense signal in the periventricular white matter, likely the sequela of mild-to-moderate chronic small vessel ischemic disease. Vascular: Normal arterial flow voids. Normal arterial and venous enhancement. Skull and upper cervical spine: Normal marrow signal. Sinuses/Orbits: Air-fluid level in the right maxillary sinus. Mucosal thickening in the left maxillary sinus. No acute finding in the orbits. Other: The mastoid air cells are well aerated. IMPRESSION: 1. Evaluation is somewhat limited by motion artifact. 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 2. Additional focus of diffusion restriction in the medial left frontal cortex which correlates with possible contrast enhancement, although this is not well defined, in part due to motion. This is concerning for a focus of metastatic disease but could also represent an acute infarct. Attention on follow-up. 3. Additional likely subacute infarcts in the right posterior frontal lobe, left occipital lobe, and right caudate tail. 4. Air-fluid level in the right maxillary sinus, which is nonspecific but can be seen in the setting of acute sinusitis. Electronically Signed   By: Wiliam Ke M.D.   On: 12/16/2022 14:40   NM PET Image Initial (PI) Skull Base To Thigh  Result Date: 12/16/2022 CLINICAL DATA:  Initial treatment strategy for non-small cell lung cancer. EXAM: NUCLEAR MEDICINE PET SKULL BASE TO THIGH TECHNIQUE: 6.8 mCi F-18 FDG was injected intravenously. Full-ring PET imaging was  performed from the skull base to thigh after the radiotracer. CT data was obtained and used for attenuation correction and anatomic localization. Fasting blood glucose: 111 mg/dl COMPARISON:  CT chest 16/05/9603. FINDINGS: Mediastinal blood pool activity: SUV max 1.7 Liver activity: SUV max NA NECK: Hypermetabolic posterior left level 2 lymph node measures 8 mm (3/33), SUV max 2.1. No additional abnormal hypermetabolism. Incidental CT findings: None. CHEST: Extensive hypermetabolic adenopathy in the low internal jugular, supraclavicular, mediastinal and right hilar stations. Index low right paratracheal nodal mass measures approximately 3.7 cm (3/93), SUV max 6.5. Masslike consolidation in the right lower lobe is somewhat difficult to measure but collectively, measures approximately 8.9 x 9.1 cm (7/74), with SUV max measured superiorly, 8.2. Incidental CT findings: Left IJ Port-A-Cath terminates in the right atrium. Atherosclerotic calcification of the aorta and coronary arteries. Heart is at the upper limits of normal in size. Trace pericardial effusion. Small loculated right pleural effusion. Paraseptal emphysema. Suspect basilar honeycombing. ABDOMEN/PELVIS: Vague activity in the periportal region without a definite CT correlate. High left periaortic lymph nodes measure up to approximately 7 mm (3/155), SUV max 2.7. Small nodules posterior to the right kidney measure up to 10 mm (3/186) do not show abnormal hypermetabolism. No additional abnormal hypermetabolism. Incidental CT findings: Liver, adrenal glands, kidneys, spleen, pancreas, stomach and bowel are grossly unremarkable. Cholecystectomy. Prostate is slightly enlarged. SKELETON: No abnormal hypermetabolism. Incidental CT findings: Degenerative changes in the spine. Postoperative changes in the lumbar spine. IMPRESSION: 1. 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 2. Small nodules posterior to the right kidney do not show abnormal hypermetabolism. Recommend attention on follow-up. 3. Trace pericardial effusion. 4. Small loculated right pleural effusion. 5. 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 6. Mildly enlarged prostate. 7. Aortic atherosclerosis (ICD10-I70.0).  Coronary artery calcification. Electronically Signed   By: Leanna Battles M.D.   On: 12/16/2022 14:39   IR IMAGING GUIDED PORT INSERTION  Result Date: 12/14/2022 INDICATION: SCLC EXAM: IMPLANTED PORT A CATH PLACEMENT WITH ULTRASOUND AND FLUOROSCOPIC GUIDANCE MEDICATIONS: None ANESTHESIA/SEDATION: Moderate (conscious) sedation was employed during this procedure. A total of Versed 2 mg and Fentanyl 100 mcg was administered intravenously. Moderate Sedation Time: 33 minutes. The patient's level of consciousness and vital signs were monitored continuously by radiology nursing throughout the procedure under my direct supervision. FLUOROSCOPY TIME:  Fluoroscopic dose; 1 mGy COMPLICATIONS: None immediate. PROCEDURE: The procedure, risks, benefits, and alternatives were explained to the patient. Questions regarding the procedure were encouraged and answered. The patient understands and consents to the procedure. The LEFT neck and chest were prepped with chlorhexidine in a sterile fashion, and a sterile drape was applied covering the operative field. Maximum barrier sterile technique with sterile gowns and gloves were used for the procedure. A timeout was performed prior to the initiation of the procedure. Local anesthesia was provided with 1% lidocaine with epinephrine. After creating a small venotomy incision, a micropuncture kit was utilized to access the internal jugular vein under direct, real-time ultrasound guidance. Ultrasound image documentation was performed. The microwire was kinked to measure appropriate catheter length. A  subcutaneous port pocket was then created along the upper chest wall utilizing a combination of sharp and blunt dissection. The pocket was irrigated with sterile saline. A single lumen ISP power injectable port was chosen for placement. The 8 Fr catheter was tunneled from the port pocket site to the venotomy incision. The port was placed in the pocket. The external catheter was trimmed to appropriate length. At the venotomy, an 8 Fr peel-away sheath was placed over a guidewire under fluoroscopic guidance. The catheter was then placed through the sheath and the sheath was removed. Final catheter positioning was confirmed and documented with a fluoroscopic spot radiograph. The port was accessed with a Huber needle, aspirated and flushed with heparinized saline. The port pocket incision was closed with interrupted 3-0 Vicryl suture then Dermabond was applied, including at the venotomy incision. Dressings were placed. The patient tolerated the procedure well without immediate post procedural complication. IMPRESSION: Successful placement of a LEFT internal jugular approach power injectable Port-A-Cath. The tip of the catheter is positioned within the proximal RIGHT atrium. The catheter is ready for immediate use. Roanna Banning, MD [facility] Electronically Signed   By: Roanna Banning M.D.   On: 12/14/2022 11:06   [place] CORE BIOPSY (LYMPH NODES)  Result Date: 12/12/2022 INDICATION: R Lung Mass with R Supraclavicular Lymph Node EXAM: ULTRASOUND-GUIDED RIGHT SUPRACLAVICULAR LYMPH NODE BIOPSY COMPARISON:  CT chest, 11/24/2022. MEDICATIONS: 5 mL lidocaine 1% ANESTHESIA/SEDATION: Local anesthetic was administered. COMPLICATIONS: None immediate. TECHNIQUE: Informed written consent was obtained from the patient and/or patient's representative after a discussion of the risks, benefits and alternatives to treatment. Questions regarding the procedure were encouraged and  answered. Initial ultrasound scanning demonstrated enlarged RIGHT supraclavicular lymph mass. An ultrasound image was saved for documentation purposes. The procedure was planned. A timeout was performed prior to the initiation of the procedure. The operative was prepped and draped in the usual sterile fashion, and a sterile drape was applied covering the operative field. A timeout was performed prior to the initiation of the procedure. Local anesthesia was provided with 1% lidocaine. Under direct ultrasound guidance, an 18 gauge core needle device was utilized to obtain to obtain 4  core needle biopsies of the RIGHT supraclavicular lymph node. The samples were placed in saline and submitted to pathology. The needle was removed and superficial hemostasis was achieved with manual compression. Post procedure scan was negative for significant hematoma. A dressing was applied. The patient tolerated the procedure well without immediate postprocedural complication. IMPRESSION: Successful ultrasound guided biopsy of the enlarged RIGHT supraclavicular lymph node. Roanna Banning, MD [facility] Electronically Signed   By: Roanna Banning M.D.   On: 12/12/2022 14:46     Assessment & Plan:   IPF (idiopathic pulmonary fibrosis) (HCC) - Resume OFEV, contacted pharmacy team to help with this   Acute on chronic respiratory failure with hypoxia (HCC) - Ran out of oxygen tanks. O2 on room air 70s. Qualified for POC. Advised patient to use 4-5L supplemental O2 at all times.   Pulmonary nodules - CTA completed showing patchy airspace disease in lungs bilaterally with masslike consolidation RLL,   - Presumed multifocal pneumonia- started on ceftriaxone and azithromycin. Completed Levaquin course  - Repeat CT chest imaging pending to follow up on right lung mass  Pleural effusion on right - Not tapped or given diuretics, CXR on 11/10/22 showed small effusion only.  Consider thoracentesis if symptoms do not improve.    Glenford Bayley, NP 12/27/2022

## 2022-11-25 ENCOUNTER — Telehealth: Payer: Self-pay | Admitting: Pharmacist

## 2022-11-25 ENCOUNTER — Other Ambulatory Visit (HOSPITAL_COMMUNITY): Payer: Self-pay

## 2022-11-25 DIAGNOSIS — J8410 Pulmonary fibrosis, unspecified: Secondary | ICD-10-CM

## 2022-11-25 NOTE — Telephone Encounter (Signed)
Patient restarting Ofev (~2 month interruption)  Submitted a Prior Authorization request to [facility] for OFEV via CoverMyMeds. Pending clinical questions to populate  Key: [facility]  Patient also has an active Medicaid plan  Chesley Mires, PharmD, MPH, BCPS, CPP Clinical Pharmacist (Rheumatology and Pulmonology)

## 2022-11-25 NOTE — Telephone Encounter (Signed)
Clinical questions for Ofev PA completed on CMM  Chesley Mires, PharmD, MPH, BCPS, CPP Clinical Pharmacist (Rheumatology and Pulmonology)

## 2022-11-28 NOTE — Assessment & Plan Note (Signed)
-   CTA completed showing patchy airspace disease in lungs bilaterally with masslike consolidation RLL,   - Presumed multifocal pneumonia- started on ceftriaxone and azithromycin. Completed Levaquin course  - Repeat CT chest imaging pending to follow up on right lung mass

## 2022-11-28 NOTE — Assessment & Plan Note (Addendum)
-   Not tapped or given diuretics, CXR on 11/10/22 showed small effusion only. Consider thoracentesis if symptoms do not improve.

## 2022-11-28 NOTE — Assessment & Plan Note (Signed)
-   Resume OFEV, contacted pharmacy team to help with this

## 2022-11-28 NOTE — Assessment & Plan Note (Addendum)
-   Ran out of oxygen tanks. O2 on room air 70s. Qualified for POC. Advised patient to use 4-5L supplemental O2 at all times.

## 2022-11-29 ENCOUNTER — Telehealth: Payer: Self-pay | Admitting: Family

## 2022-11-29 ENCOUNTER — Other Ambulatory Visit: Payer: Self-pay | Admitting: Family

## 2022-11-29 ENCOUNTER — Other Ambulatory Visit (HOSPITAL_COMMUNITY): Payer: Self-pay

## 2022-11-29 DIAGNOSIS — C3491 Malignant neoplasm of unspecified part of right bronchus or lung: Secondary | ICD-10-CM

## 2022-11-29 MED ORDER — OFEV 150 MG PO CAPS
150.0000 mg | ORAL_CAPSULE | Freq: Two times a day (BID) | ORAL | 1 refills | Status: DC
Start: 2022-11-29 — End: 2023-02-02

## 2022-11-29 NOTE — Telephone Encounter (Signed)
Received notification from  [facility]  regarding a prior authorization for OFEV. Authorization has been APPROVED from 11/25/22 to 11/25/23. Approval letter sent to scan center.  Unable to run test claim because patient must fill through [facility]: [phone]. His copay should be $0 since he has [employer] as primary.   Authorization # [id number]   Rx sent to pharmacy today. Per test claim, copay through Medicaid is also $4 which means plans are not coordinated benefits. Patient sates he is unaware of any [facility] plan that he is enrolled into. He will call pharmacy tomorrow to schedule shipment.   Chesley Mires, PharmD, MPH, BCPS, CPP Clinical Pharmacist (Rheumatology and Pulmonology)

## 2022-11-29 NOTE — Telephone Encounter (Signed)
Pt returning missed call from PCP regarding imaging results.

## 2022-11-30 ENCOUNTER — Telehealth: Payer: Self-pay | Admitting: Family

## 2022-11-30 DIAGNOSIS — J439 Emphysema, unspecified: Secondary | ICD-10-CM | POA: Diagnosis not present

## 2022-11-30 DIAGNOSIS — J84112 Idiopathic pulmonary fibrosis: Secondary | ICD-10-CM | POA: Diagnosis not present

## 2022-11-30 NOTE — Telephone Encounter (Signed)
Appt made

## 2022-11-30 NOTE — Telephone Encounter (Signed)
Pts wife called requesting in person or video visit with Bradley Rice to go over his FMLA and HFU visit. Explained to pt that Bradley Rice does not have any openings until 6/3. Wants to know if pt can be worked in for video visit sometimes this week to discuss.  Please advise.

## 2022-11-30 NOTE — Telephone Encounter (Signed)
Pt wife aware

## 2022-12-01 ENCOUNTER — Telehealth: Payer: Self-pay | Admitting: Family

## 2022-12-01 ENCOUNTER — Telehealth: Payer: Medicaid Other | Admitting: Family

## 2022-12-01 DIAGNOSIS — Z0289 Encounter for other administrative examinations: Secondary | ICD-10-CM

## 2022-12-01 NOTE — Telephone Encounter (Signed)
Bradley Rice and Bradley Rice dropped off Bradley Rice forms to be completed and signed.  Form Fee Paid? (Y/N)   YES         If NO, form is placed on front office manager desk to hold until payment received. If YES, then form will be placed in the RX/HH Nurse Coordinators box for completion.  Form will not be processed until payment is received

## 2022-12-01 NOTE — Telephone Encounter (Signed)
Returned call and discussed results of CT scan. Referral to Oncologists placed.   [name], FNP

## 2022-12-02 NOTE — Telephone Encounter (Signed)
Information completed and forwarded to PCP 

## 2022-12-07 DIAGNOSIS — C3491 Malignant neoplasm of unspecified part of right bronchus or lung: Secondary | ICD-10-CM | POA: Insufficient documentation

## 2022-12-07 NOTE — Progress Notes (Signed)
[facility] [address] [place] [address]   Clinic Day:  12/08/2022  Referring physician: [name], FNP  Patient Care Team: [name], FNP as PCP - General (Family Medicine) Heidi Dach, RN as Case Manager Merryl Hacker, NP as Nurse Practitioner (Nurse Practitioner) Doreatha Massed, MD as Medical Oncologist (Medical Oncology) Therese Sarah, RN as Oncology Nurse Navigator (Medical Oncology)   ASSESSMENT & PLAN:   Assessment:  1.  Clinical stage III lung cancer: - 20 to 30 pound weight loss in the last 4 months due to decreased appetite. - Pain in the chest bilaterally since end of April with hemoptysis. - CT chest (11/22/2022): Multiple right lower lobe lung masses, bulky right hilar, mediastinal, right greater than left supraclavicular lymph nodes.  Fibrotic interstitial lung disease.  2.  Social/family history: - Lives at home with his wife.  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  Quit smoking 2 years ago.  Smoked 1 and half pack per day for 30 years. - He has been on oxygen at nighttime for 2 years and has been on continuous oxygen at 6 L/min since recent hospitalization in April. - He was adopted and does not know family history.  Plan:  1.  Clinical locally advanced lung cancer: - We have reviewed images of the CT scan with the patient and his wife in detail. - Recommend further imaging with PET CT scan and brain MRI to complete workup for staging. - Recommend IR guided core biopsy of the right supraclavicular lymph node which is easily accessible. - If lung cancer confirmed, we will send for NGS testing including PD-L1. - RTC 1 week after the biopsy to discuss exact diagnosis, prognosis and treatment plan.   Orders Placed This Encounter  Procedures   NM PET Image Initial (PI) Skull Base To Thigh    Standing Status:   Future    Standing Expiration Date:   12/08/2023    Order Specific  Question:   If indicated for the ordered procedure, I authorize the administration of a radiopharmaceutical per Radiology protocol    Answer:   Yes    Order Specific Question:   Preferred imaging location?    Answer:   [facility]    Order Specific Question:   Release to patient    Answer:   Immediate   MR Brain W Wo Contrast    Standing Status:   Future    Standing Expiration Date:   12/08/2023    Order Specific Question:   If indicated for the ordered procedure, I authorize the administration of contrast media per Radiology protocol    Answer:   Yes    Order Specific Question:   What is the patient's sedation requirement?    Answer:   No Sedation    Order Specific Question:   Does the patient have a pacemaker or implanted devices?    Answer:   No    Order Specific Question:   Use SRS Protocol?    Answer:   No    Order Specific Question:   Preferred imaging location?    Answer:   [facility] (table limit [phone])    Order Specific Question:   Release to patient    Answer:   Immediate   [place] CORE BIOPSY (LYMPH NODES)    Standing Status:   Future    Standing Expiration Date:   12/08/2023    Order Specific  Question:   Lab orders requested (DO NOT place separate lab orders, these will be automatically ordered during procedure specimen collection):    Answer:   Surgical Pathology    Order Specific Question:   Reason for Exam (SYMPTOM  OR DIAGNOSIS REQUIRED)    Answer:   R Lung Mass with R Supraclavicular Lymph Node    Order Specific Question:   Preferred location?    Answer:   [facility]    Order Specific Question:   Release to patient    Answer:   Immediate      I,Katie Daubenspeck,acting as a scribe for Doreatha Massed, MD.,have documented all relevant documentation on the behalf of Doreatha Massed, MD,as directed by  Doreatha Massed, MD while in the presence of Doreatha Massed, MD.   I, Doreatha Massed MD, have reviewed the above documentation for  accuracy and completeness, and I agree with the above.   Doreatha Massed, MD   5/9/20244:54 PM  CHIEF COMPLAINT/PURPOSE OF CONSULT:   Diagnosis: multiple RLL lung masses with regional lymphadenopathy   Cancer Staging  Cancer of right lung [facility]) Staging form: Lung, AJCC 8th Edition - Clinical stage from 12/07/2022: Stage IIIC (cT3, cN3, cM0) - Unsigned    Prior Therapy: none  Current Therapy:  pending workup   HISTORY OF PRESENT ILLNESS:   Oncology History   No history exists.      Bradley Rice is a 62 y.o. male presenting to clinic today for evaluation of multiple RLL lung masses with regional lymphadenopathy at the request of [name], FNP.  He presented to the ED on 06/13/22 with acute dry cough with associated chest pain, nausea, and SOB. Angio chest CT showed: no evidence of PE; new area of rounded consolidation within superior RLL, with associated right hilar and mediastinal adenopathy. He was treated for RLL pneumonia with Zithromax, and a repeat CT was recommended in 3 months to ensure resolution.  He presented to urgent care on 11/04/22 with cough and fever and was again prescribed azithromycin. Given persistent symptoms the following day, he returned to urgent care and was switched to doxycycline. He later developed SOB and presented to the ED on 11/08/22. A repeat angio chest CT was obtained and showed: no evidence of PE; patchy airspace disease in lung bilaterally with mass-like consolidation in RLL, which may be infectious or inflammatory but underlying neoplasm cannot be excluded; moderate likely loculated pleural effusion on right; findings compatible with pulmonary fibrosis; cardiomegaly with small pericardial effusion. He was admitted and treated for multifocal pneumonia with ceftriaxone and azithromycin. He was discharged on 11/11/22 on levofloxacin.  He saw his PCP for outpatient follow up and underwent chest CT on 11/24/22 showing: multiple RLL pulmonary masses,  unchanged compared to recent CT and concerning for primary lung malignancy; bulky right hilar, mediastinal, and right-greater-than-left supraclavicular lymph nodes; perifissural solid nodule of right lung is increased in size compared with 2021, likely an intrapulmonary lymph node and concerning for additional site of nodal disease; additional bilateral scattered solid pulmonary nodules are stable compared to 2021 and likely benign; small right pleural effusion and trace pericardial effusion; decreased patchy airspace opacities, consistent with resolving infectious or inflammatory process.  Today, he states that he is doing well overall. His appetite level is at 10%. His energy level is at 10%.  PAST MEDICAL HISTORY:   Past Medical History: Past Medical History:  Diagnosis Date   ALS (amyotrophic lateral sclerosis) (HCC)    Asthma    Back pain  COPD (chronic obstructive pulmonary disease) (HCC)    Coronary artery calcification seen on CT scan    DDD (degenerative disc disease), lumbar    Depression    Dysphagia    Following previous surgery   History of blood transfusion 2014   Post-op bleed   History of lung cancer    History of stroke 2015   Idiopathic pulmonary fibrosis (HCC)    Migraine    Spider bite 2012    Surgical History: Past Surgical History:  Procedure Laterality Date   ANTERIOR CERVICAL DECOMP/DISCECTOMY FUSION  12/22/2011   Procedure: ANTERIOR CERVICAL DECOMPRESSION/DISCECTOMY FUSION 3 LEVELS;  Surgeon: Venita Lick, MD;  Location: [facility] OR;  Service: Orthopedics;  Laterality: N/A;  ACDF C5-T1   ANTERIOR CERVICAL DECOMP/DISCECTOMY FUSION  06/25/2012   Procedure: ANTERIOR CERVICAL DECOMPRESSION/DISCECTOMY FUSION 1 LEVEL/HARDWARE REMOVAL;  Surgeon: Kerrin Champagne, MD;  Location: [facility] OR;  Service: Orthopedics;  Laterality: N/A;  Removal anterior cervical plate G3-O7, Explore Fusion, Left C5-6, C6-7, C7-T1 Re-do Foraminotomy, Left open carpal tunnel release with release of  ulnar nerve at [facility], Posterior Cervical Fusion with lateral mass screw   ANTERIOR CERVICAL DECOMP/DISCECTOMY FUSION N/A 03/25/2021   Procedure: ANTERIOR CERVICAL DECOMPRESSION/DISCECTOMY FUSION 2 LEVELS  C3-5;  Surgeon: Venita Lick, MD;  Location: [facility] OR;  Service: Orthopedics;  Laterality: N/A;  ANTERIOR CERVICAL DECOMPRESSION/DISCECTOMY FUSION 2 LEVELS  C3-5   BACK SURGERY     CARDIAC CATHETERIZATION     CARPAL TUNNEL RELEASE  06/25/2012   Procedure: CARPAL TUNNEL RELEASE;  Surgeon: Kerrin Champagne, MD;  Location: [facility] OR;  Service: Orthopedics;  Laterality: Left;  as above   CHOLECYSTECTOMY  2009   CORONARY ANGIOPLASTY  2001   [facility]   LUMBAR FUSION  2000   POSTERIOR CERVICAL FUSION/FORAMINOTOMY  06/25/2012   Procedure: POSTERIOR CERVICAL FUSION/FORAMINOTOMY LEVEL 1;  Surgeon: Kerrin Champagne, MD;  Location: [facility] OR;  Service: Orthopedics;  Laterality: N/A;  as above   POSTERIOR CERVICAL FUSION/FORAMINOTOMY N/A 12/17/2013   Procedure: REMOVAL OF POSTERIOR CERVICAL FUSION LATERAL MASS SCREWS AND RODS C5-T1, EXPLORE FUSION, LEFT SIX-SEVEN FORAMINOTOMY;  Surgeon: Kerrin Champagne, MD;  Location: [facility] OR;  Service: Orthopedics;  Laterality: N/A;   VASECTOMY  1990    Social History: Social History   Socioeconomic History   Marital status: Married    Spouse name: [name]   Number of children: 2   Years of education: 10th   Highest education level: 12th grade  Occupational History   Occupation: Disability    Employer: [employer]  Tobacco Use   Smoking status: Former    Packs/day: 1.00    Years: 30.00    Additional pack years: 0.00    Total pack years: 30.00    Types: Cigarettes    Quit date: 10/2015    Years since quitting: 7.1    Passive exposure: Current   Smokeless tobacco: Never   Tobacco comments:    Patient denies current smoking.  Patient lives with wife who is a smoker. Updated 11/24/2022. am  Vaping Use   Vaping Use: Never used  Substance and Sexual Activity   Alcohol  use: No    Alcohol/week: 0.0 standard drinks of alcohol   Drug use: No   Sexual activity: Yes  Other Topics Concern   Not on file  Social History Narrative   Patient lives at home with his spouse.   Caffeine Use: coffee   Social Determinants of Health   Financial Resource Strain: Low Risk  (  11/18/2022)   Overall Financial Resource Strain (CARDIA)    Difficulty of Paying Living Expenses: Not very hard  Food Insecurity: No Food Insecurity (11/18/2022)   Hunger Vital Sign    Worried About Running Out of Food in the Last Year: Never true    Ran Out of Food in the Last Year: Never true  Transportation Needs: No Transportation Needs (11/18/2022)   PRAPARE - Administrator, Civil Service (Medical): No    Lack of Transportation (Non-Medical): No  Physical Activity: Unknown (11/18/2022)   Exercise Vital Sign    Days of Exercise per Week: Patient declined    Minutes of Exercise per Session: Not on file  Stress: Stress Concern Present (11/18/2022)   [employer] of Occupational Health - Occupational Stress Questionnaire    Feeling of Stress : Very much  Social Connections: Unknown (11/18/2022)   Social Connection and Isolation Panel [NHANES]    Frequency of Communication with Friends and Family: More than three times a week    Frequency of Social Gatherings with Friends and Family: Three times a week    Attends Religious Services: Patient declined    Active Member of Clubs or Organizations: No    Attends Banker Meetings: Not on file    Marital Status: Married  Intimate Partner Violence: Not At Risk (11/09/2022)   Humiliation, Afraid, Rape, and Kick questionnaire    Fear of Current or Ex-Partner: No    Emotionally Abused: No    Physically Abused: No    Sexually Abused: No    Family History: Family History  Problem Relation Age of Onset   ALS Mother    Cancer Mother    Cancer Sister    ALS Sister    Cancer Brother        1 brother   Anesthesia problems  Neg Hx     Current Medications:  Current Outpatient Medications:    albuterol (PROVENTIL) (2.5 MG/3ML) 0.083% nebulizer solution, Take 3 mLs (2.5 mg total) by nebulization every 6 (six) hours as needed for wheezing or shortness of breath., Disp: 150 mL, Rfl: 1   albuterol (VENTOLIN HFA) 108 (90 Base) MCG/ACT inhaler, Inhale 2 puffs into the lungs every 6 (six) hours as needed for wheezing or shortness of breath., Disp: 1 each, Rfl: 5   benzonatate (TESSALON) 200 MG capsule, Take 1 capsule (200 mg total) by mouth 3 (three) times daily as needed., Disp: 90 capsule, Rfl: 2   blood glucose meter kit and supplies, Dispense based on patient and insurance preference. Use up to four times daily as directed. (FOR ICD-10 E10.9, E11.9)., Disp: 1 each, Rfl: 0   busPIRone (BUSPAR) 5 MG tablet, Take 1 tablet (5 mg total) by mouth 3 (three) times daily., Disp: 90 tablet, Rfl: 1   DULoxetine (CYMBALTA) 60 MG capsule, Take 1 capsule (60 mg total) by mouth daily., Disp: 90 capsule, Rfl: 1   Ensure Max Protein (ENSURE MAX PROTEIN) LIQD, Take 330 mLs by mouth 3 (three) times daily., Disp: 29700 mL, Rfl: 2   fexofenadine (ALLEGRA) 180 MG tablet, Take 1 tablet (180 mg total) by mouth daily., Disp: 90 tablet, Rfl: 1   ibuprofen (ADVIL) 800 MG tablet, Take 1 tablet (800 mg total) by mouth every 8 (eight) hours as needed., Disp: 30 tablet, Rfl: 0   magic mouthwash w/lidocaine SOLN, Take 10 mLs by mouth 3 (three) times daily as needed for mouth pain., Disp: 250 mL, Rfl: 1   methocarbamol (ROBAXIN) 500  MG tablet, Take 1 tablet (500 mg total) by mouth 2 (two) times daily as needed for muscle spasms., Disp: 20 tablet, Rfl: 0   metoprolol tartrate (LOPRESSOR) 50 MG tablet, Take 0.5 tablets (25 mg total) by mouth 2 (two) times daily., Disp: 60 tablet, Rfl: 1   naloxone (NARCAN) nasal spray 4 mg/0.1 mL, Place 1 spray into the nose as needed (opioid overdose)., Disp: , Rfl:    Nintedanib (OFEV) 150 MG CAPS, Take 1 capsule (150 mg  total) by mouth 2 (two) times daily., Disp: 180 capsule, Rfl: 1   nortriptyline (PAMELOR) 50 MG capsule, Take 50 mg by mouth 2 (two) times daily., Disp: , Rfl:    oxyCODONE (ROXICODONE) 15 MG immediate release tablet, Take 15 mg by mouth 4 (four) times daily., Disp: , Rfl: 0   oxyCODONE-acetaminophen (PERCOCET) 10-325 MG tablet, Take 1 tablet by mouth 3 (three) times daily as needed., Disp: 90 tablet, Rfl: 0   sildenafil (REVATIO) 20 MG tablet, Take by mouth., Disp: , Rfl:    topiramate (TOPAMAX) 50 MG tablet, Take by mouth., Disp: , Rfl:    traZODone (DESYREL) 50 MG tablet, Take 2 tablets (100 mg total) by mouth at bedtime., Disp: , Rfl:    LORazepam (ATIVAN) 0.5 MG tablet, Take 1 tablet (0.5 mg total) by mouth once as needed for up to 1 dose for anxiety. Take 1hr prior to scans, Disp: 2 tablet, Rfl: 0   Allergies: Allergies  Allergen Reactions   Pirfenidone Nausea And Vomiting and Other (See Comments)    Weight loss    REVIEW OF SYSTEMS:   Review of Systems  Constitutional:  Positive for fatigue. Negative for chills and fever.  HENT:   Positive for lump/mass. Negative for mouth sores, nosebleeds, sore throat and trouble swallowing.   Eyes:  Negative for eye problems.  Respiratory:  Positive for cough and shortness of breath.   Cardiovascular:  Positive for chest pain. Negative for leg swelling and palpitations.  Gastrointestinal:  Positive for nausea and vomiting. Negative for abdominal pain, constipation and diarrhea.  Genitourinary:  Negative for bladder incontinence, difficulty urinating, dysuria, frequency, hematuria and nocturia.   Musculoskeletal:  Negative for arthralgias, back pain, flank pain, myalgias and neck pain.  Skin:  Negative for itching and rash.  Neurological:  Negative for dizziness, headaches and numbness.  Hematological:  Does not bruise/bleed easily.  Psychiatric/Behavioral:  Negative for depression, sleep disturbance and suicidal ideas. The patient is  nervous/anxious.   All other systems reviewed and are negative.    VITALS:   Blood pressure 103/78, pulse (!) 114, temperature 97.8 F (36.6 C), temperature source Oral, height 6' (1.829 m), weight 132 lb (59.9 kg), SpO2 100 %.  Wt Readings from Last 3 Encounters:  12/08/22 132 lb (59.9 kg)  11/24/22 139 lb 3.2 oz (63.1 kg)  11/18/22 143 lb (64.9 kg)    Body mass index is 17.9 kg/m.  Performance status (ECOG): 1 - Symptomatic but completely ambulatory  PHYSICAL EXAM:   Physical Exam Vitals and nursing note reviewed. Exam conducted with a chaperone present.  Constitutional:      Appearance: Normal appearance.  Cardiovascular:     Rate and Rhythm: Normal rate and regular rhythm.     Pulses: Normal pulses.     Heart sounds: Normal heart sounds.  Pulmonary:     Effort: Pulmonary effort is normal.     Breath sounds: Normal breath sounds.  Abdominal:     Palpations: Abdomen is soft. There is  no hepatomegaly, splenomegaly or mass.     Tenderness: There is no abdominal tenderness.  Musculoskeletal:     Right lower leg: No edema.     Left lower leg: No edema.  Lymphadenopathy:     Cervical: No cervical adenopathy.     Right cervical: No superficial, deep or posterior cervical adenopathy.    Left cervical: No superficial, deep or posterior cervical adenopathy.     Upper Body:     Right upper body: No supraclavicular or axillary adenopathy.     Left upper body: No supraclavicular or axillary adenopathy.  Neurological:     General: No focal deficit present.     Mental Status: He is alert and oriented to person, place, and time.  Psychiatric:        Mood and Affect: Mood normal.        Behavior: Behavior normal.     LABS:      Latest Ref Rng & Units 11/18/2022   12:26 PM 11/10/2022    4:41 AM 11/09/2022    5:14 AM  CBC  WBC 3.4 - 10.8 x10E3/uL 15.3  24.8  12.2   Hemoglobin 13.0 - 17.7 g/dL 16.1  09.6  04.5   Hematocrit 37.5 - 51.0 % 39.2  36.9  36.9   Platelets 150 -  450 x10E3/uL 514  411  374       Latest Ref Rng & Units 11/18/2022   12:26 PM 11/09/2022    5:14 AM 11/08/2022    6:45 PM  CMP  Glucose 70 - 99 mg/dL 409  811  914   BUN 8 - 27 mg/dL 11  11  9    Creatinine 0.76 - 1.27 mg/dL 7.82  9.56  2.13   Sodium 134 - 144 mmol/L 146  138  138   Potassium 3.5 - 5.2 mmol/L 4.1  3.5  3.5   Chloride 96 - 106 mmol/L 103  105  102   CO2 20 - 29 mmol/L 30  27  27    Calcium 8.6 - 10.2 mg/dL 9.1  8.5  8.7   Total Protein 6.0 - 8.5 g/dL 6.5  6.5  7.0   Total Bilirubin 0.0 - 1.2 mg/dL 0.4  0.6  1.0   Alkaline Phos 44 - 121 IU/L 99  100  110   AST 0 - 40 IU/L 30  17  23    ALT 0 - 44 IU/L 81  27  31      No results found for: "CEA1", "CEA" / No results found for: "CEA1", "CEA" Lab Results  Component Value Date   PSA1 2.9 09/01/2022   No results found for: "YQM578" No results found for: "CAN125"  No results found for: "TOTALPROTELP", "ALBUMINELP", "A1GS", "A2GS", "BETS", "BETA2SER", "GAMS", "MSPIKE", "SPEI" Lab Results  Component Value Date   TIBC 248 (L) 02/10/2022   TIBC 232 (L) 12/11/2017   TIBC 275 03/17/2017   FERRITIN 207 02/10/2022   FERRITIN 174 12/11/2017   FERRITIN 125 03/17/2017   IRONPCTSAT 17 02/10/2022   IRONPCTSAT 20 12/11/2017   IRONPCTSAT 15 03/17/2017   No results found for: "LDH"   STUDIES:   CT Chest W Contrast  Result Date: 11/29/2022 CLINICAL DATA:  Lung mass; * Tracking Code: BO * EXAM: CT CHEST WITH CONTRAST TECHNIQUE: Multidetector CT imaging of the chest was performed during intravenous contrast administration. RADIATION DOSE REDUCTION: This exam was performed according to the departmental dose-optimization program which includes automated exposure control, adjustment of the mA and/or  kV according to patient size and/or use of iterative reconstruction technique. CONTRAST:  60mL OMNIPAQUE IOHEXOL 300 MG/ML  SOLN COMPARISON:  Chest CT dated November 08, 2022 FINDINGS: Cardiovascular: Normal heart size. Trace pericardial  effusion. Normal caliber thoracic aorta with mild atherosclerotic disease. Mild coronary artery calcifications. Mediastinum/Nodes: Bulky right hilar and mediastinal lymph nodes. Enlarged right-greater-than-left supraclavicular lymph nodes. Some of the nodes demonstrate central low density which is likely due to necrosis. Reference right lower paratracheal lymph node measuring 0.9 cm in short axis on series 3, image 65 reference right supraclavicular lymph node measuring 1.8 cm in short axis series 3, image 20. Lungs/Pleura: Moderate narrowing of the right middle lobe and right lower lobe bronchi occlusion of several of the segmental left lower lobe bronchi. Multiple right lower lobe pulmonary masses causing moderate narrowing of the right lower lobe pulmonary artery. Largest measures 6.0 x 4.2 cm on series 5, image 104, unchanged when compared with the prior exam. Solid pulmonary nodule located at the junction of the major mitral or fissure measuring 8 mm on series 5, image 82, previously 4 mm on April 27th 2021 prior. Additional bilateral scattered solid pulmonary nodules are stable when compared with prior CT dated November 26, 2019. Reference left upper lobe nodule measuring 8 mm on series 5, image 82, unchanged. Small right pleural effusion. Severe upper lobe predominant paraseptal emphysema. Unchanged lower lung predominant fibrosis with honeycomb change. Decreased patchy airspace opacities with mild residual ground-glass opacities seen in the upper lungs. Upper Abdomen: No acute abnormality. Musculoskeletal: No chest wall abnormality. No acute or significant osseous findings. IMPRESSION: 1. Multiple right lower lobe pulmonary masses, unchanged when compared with the most recent prior and concerning for primary lung malignancy. Recommend tissue sampling. 2. Bulky right hilar, mediastinal, and right-greater-than-left supraclavicular lymph nodes, concerning for nodal metastatic disease. 3. A perifissural solid  nodule of the right lung is increased in size compared with 2021 prior, likely an intrapulmonary lymph node, concerning for additional site of nodal disease. 4. Additional bilateral scattered solid pulmonary nodules are stable when compared with prior CT dated November 26, 2019 and likely benign. 5. Small right pleural effusion and trace pericardial effusion. 6. Decreased patchy airspace opacities with mild residual ground-glass opacities seen in the upper lungs, consistent with resolving infectious or inflammatory process. 7. Unchanged fibrotic interstitial lung disease, consistent with UIP pattern. 8. Aortic Atherosclerosis (ICD10-I70.0) and Emphysema (ICD10-J43.9). These results will be called to the ordering clinician or representative by the Radiologist Assistant, and communication documented in the PACS or [employer]. Electronically Signed   By: Allegra Lai M.D.   On: 11/29/2022 08:55   DG CHEST PORT 1 VIEW  Result Date: 11/10/2022 CLINICAL DATA:  Multifocal pneumonia EXAM: PORTABLE CHEST 1 VIEW COMPARISON:  November 08, 2022. FINDINGS: Persistent interstitial and patchy airspace opacities, including masslike consolidation at the right lung base. Small right pleural effusion. Enlarged cardiac silhouette. IMPRESSION: 1. Persistent interstitial and patchy airspace opacities, including masslike consolidation at the right lung base that was better characterized on recent CT chest. 2. Small right pleural effusion. 3. Cardiomegaly. Electronically Signed   By: Feliberto Harts M.D.   On: 11/10/2022 10:19   CT Angio Chest PE W/Cm &/Or Wo Cm  Result Date: 11/08/2022 CLINICAL DATA:  Pulmonary embolism suspected, high probability. Pulmonary fibrosis. EXAM: CT ANGIOGRAPHY CHEST WITH CONTRAST TECHNIQUE: Multidetector CT imaging of the chest was performed using the standard protocol during bolus administration of intravenous contrast. Multiplanar CT image reconstructions and MIPs were obtained  to evaluate the  vascular anatomy. RADIATION DOSE REDUCTION: This exam was performed according to the departmental dose-optimization program which includes automated exposure control, adjustment of the mA and/or kV according to patient size and/or use of iterative reconstruction technique. CONTRAST:  OMNIPAQUE IOHEXOL 350 MG/ML SOLN COMPARISON:  06/13/2022. FINDINGS: Cardiovascular: The heart is enlarged and there is a small pericardial effusion. Scattered coronary artery calcifications are noted. There is atherosclerotic calcification of the aorta without evidence of aneurysm. The pulmonary trunk is mildly distended suggesting underlying pulmonary artery hypertension. No evidence of pulmonary embolism. Mediastinum/Nodes: Multiple enlarged lymph nodes are present in the mediastinum measuring up to 2.6 cm in the right paratracheal space and 2.1 cm in the subcarinal space, which are likely reactive. Enlarged lymph nodes are present in the right hilum. No axillary lymphadenopathy. The trachea and esophagus are within normal limits. Lungs/Pleura: There is a moderate slightly loculated loculated pleural effusion on the right. No pneumothorax bilaterally. Patchy airspace disease is noted in the lungs bilaterally. There is a irregular masslike consolidation in the right lower lobe. Diffuse ground-glass opacities, bronchiectasis, and honeycombing are noted bilaterally, compatible with known pulmonary fibrosis. Upper Abdomen: No acute abnormality. Musculoskeletal: No acute osseous abnormality. Review of the MIP images confirms the above findings. IMPRESSION: 1. No evidence of pulmonary embolism. 2. Patchy airspace disease in the lungs bilaterally with masslike consolidation in the right lower lobe, which may be infectious or inflammatory. The possibility of underlying neoplasm can not be excluded. A short-term follow-up is recommended until resolution. 3. Moderate likely loculated pleural effusion on the right. 4. Findings compatible  with pulmonary fibrosis. 5. Cardiomegaly with small pericardial effusion and coronary artery calcifications. 6. Distended pulmonary trunk, suggesting underlying pulmonary artery hypertension. 7. Aortic atherosclerosis. Electronically Signed   By: Thornell Sartorius M.D.   On: 11/08/2022 22:53   DG Chest Port 1 View  Result Date: 11/08/2022 CLINICAL DATA:  Questionable sepsis. History of fibrosis. Typically patient patient typically on home oxygen. EXAM: PORTABLE CHEST 1 VIEW COMPARISON:  06/13/2022. FINDINGS: Patchy bilateral airspace opacities, left perihilar mid lung and both lower lungs, new since the prior exam. There is underlying interstitial thickening/fibrosis that is unchanged. Rounded opacity adjacent to the inferior right hilum, up proximally 4.3 cm in size, suspicious for a mass, and corresponding to a focal opacity noted in the superior segment of the right lower lobe on the prior chest CT from 06/13/2022. No convincing pleural effusion and no pneumothorax. Cardiac silhouette is normal in size. No convincing mediastinal mass. Skeletal structures are grossly intact. IMPRESSION: 1. Airspace opacities noted bilaterally, new since the prior chest radiographs and CT from 06/13/2022. Multifocal pneumonia suspected. 2. Possible right mid to lower lung mass. Recommend follow-up chest CT with contrast on a nonemergent basis for further assessment. 3. Underlying pulmonary fibrosis. Electronically Signed   By: Amie Portland M.D.   On: 11/08/2022 18:01

## 2022-12-08 ENCOUNTER — Inpatient Hospital Stay: Payer: 59 | Attending: Hematology | Admitting: Hematology

## 2022-12-08 ENCOUNTER — Other Ambulatory Visit: Payer: Self-pay

## 2022-12-08 ENCOUNTER — Encounter: Payer: Self-pay | Admitting: Hematology

## 2022-12-08 VITALS — BP 103/78 | HR 114 | Temp 97.8°F | Ht 72.0 in | Wt 132.0 lb

## 2022-12-08 DIAGNOSIS — Z5112 Encounter for antineoplastic immunotherapy: Secondary | ICD-10-CM | POA: Diagnosis present

## 2022-12-08 DIAGNOSIS — C3431 Malignant neoplasm of lower lobe, right bronchus or lung: Secondary | ICD-10-CM | POA: Insufficient documentation

## 2022-12-08 DIAGNOSIS — Z5111 Encounter for antineoplastic chemotherapy: Secondary | ICD-10-CM | POA: Insufficient documentation

## 2022-12-08 DIAGNOSIS — Z79620 Long term (current) use of immunosuppressive biologic: Secondary | ICD-10-CM | POA: Insufficient documentation

## 2022-12-08 DIAGNOSIS — C3490 Malignant neoplasm of unspecified part of unspecified bronchus or lung: Secondary | ICD-10-CM

## 2022-12-08 MED ORDER — LORAZEPAM 0.5 MG PO TABS: 0.5000 mg | ORAL_TABLET | Freq: Once | ORAL | 0 refills | Status: DC | PRN

## 2022-12-08 NOTE — Progress Notes (Signed)
[address] FMLA papers completed and faxed with confirmation.

## 2022-12-08 NOTE — Patient Instructions (Addendum)
[facility]  Discharge Instructions  You were seen and examined today by Dr. Ellin Saba. Dr. Ellin Saba is a medical oncologist, meaning that he specializes in the treatment of cancer diagnoses. Dr. Ellin Saba discussed your past medical history, family history of cancers, and the events that led to you being here today.  You were referred to Dr. Ellin Saba due to a new abnormal CT scan which revealed areas of concern in your right lung. It appears to be lung cancer with spread of the cancer to the lymph nodes.  Dr. Ellin Saba has recommended staging scans. Dr. Ellin Saba has ordered a PET scan and a brain MRI. These will accurately identify the stage of the cancer.  Dr. Ellin Saba will also order a biopsy.  Follow-up with Dr. Ellin Saba about 1 week after biopsy.  Thank you for choosing [facility] to provide your oncology and hematology care.   To afford each patient quality time with our provider, please arrive at least 15 minutes before your scheduled appointment time. You may need to reschedule your appointment if you arrive late (10 or more minutes). Arriving late affects you and other patients whose appointments are after yours.  Also, if you miss three or more appointments without notifying the office, you may be dismissed from the clinic at the provider's discretion.    Again, thank you for choosing [facility].  Our hope is that these requests will decrease the amount of time that you wait before being seen by our physicians.   If you have a lab appointment with the Cancer Center - please note that after April 8th, all labs will be drawn in the cancer center.  You do not have to check in or register with the main entrance as you have in the past but will complete your check-in at the cancer center.            _____________________________________________________________  Should you have questions after your visit to  [facility], please contact our office at [phone] and follow the prompts.  Our office hours are 8:00 a.m. to 4:30 p.m. Monday - Thursday and 8:00 a.m. to 2:30 p.m. Friday.  Please note that voicemails left after 4:00 p.m. may not be returned until the following business day.  We are closed weekends and all major holidays.  You do have access to a nurse 24-7, just call the main number to the clinic [phone] and do not press any options, hold on the line and a nurse will answer the phone.    For prescription refill requests, have your pharmacy contact our office and allow 72 hours.    Masks are no longer required in the cancer centers. If you would like for your care team to wear a mask while they are taking care of you, please let them know. You may have one support person who is at least 60 years old accompany you for your appointments.

## 2022-12-08 NOTE — Progress Notes (Unsigned)
Irish Lack, MD  Leodis Rains D PROCEDURE / BIOPSY REVIEW Date: 12/08/22  Requested Biopsy site: Right supraclavicular lymph node Reason for request: Malignancy Imaging review: Best seen on CT  Decision: Approved Imaging modality to perform: Ultrasound Schedule with: Moderate Sedation Schedule for: Any VIR  Additional comments: @Schedulers .  Please contact me with questions, concerns, or if issue pertaining to this request arise.  Reola Calkins, MD [facility] Inter[facility] Radiology [facility] Radiology

## 2022-12-09 ENCOUNTER — Other Ambulatory Visit: Payer: Self-pay | Admitting: Radiology

## 2022-12-09 DIAGNOSIS — C3431 Malignant neoplasm of lower lobe, right bronchus or lung: Secondary | ICD-10-CM

## 2022-12-09 NOTE — Telephone Encounter (Signed)
PCP completed and signed [employer] FMLA forms. They have been faxed to [employer] at fax number [phone]. Patient has been contacted and informed they are complete.

## 2022-12-09 NOTE — Telephone Encounter (Signed)
Per [facility] portal, rx for Ofev shipped on 12/05/22 to pt home  Chesley Mires, PharmD, MPH, BCPS, CPP Clinical Pharmacist (Rheumatology and Pulmonology)

## 2022-12-12 ENCOUNTER — Ambulatory Visit (HOSPITAL_COMMUNITY): Payer: 59

## 2022-12-12 ENCOUNTER — Ambulatory Visit (HOSPITAL_COMMUNITY)
Admission: RE | Admit: 2022-12-12 | Discharge: 2022-12-12 | Disposition: A | Discharge status: Home / Self Care | Payer: 59 | Source: Ambulatory Visit | Attending: Hematology | Admitting: Hematology

## 2022-12-12 DIAGNOSIS — R590 Localized enlarged lymph nodes: Secondary | ICD-10-CM | POA: Insufficient documentation

## 2022-12-12 DIAGNOSIS — C3431 Malignant neoplasm of lower lobe, right bronchus or lung: Secondary | ICD-10-CM | POA: Diagnosis not present

## 2022-12-12 DIAGNOSIS — R918 Other nonspecific abnormal finding of lung field: Secondary | ICD-10-CM | POA: Insufficient documentation

## 2022-12-12 MED ORDER — LIDOCAINE HCL 1 % IJ SOLN
INTRAMUSCULAR | Status: AC
  Filled 2022-12-12: qty 20

## 2022-12-12 NOTE — Procedures (Signed)
Vascular and Interventional Radiology Procedure Note  Patient: Bradley Rice Headings DOB: Aug 04, 1960 Medical Record Number: [id number] Note Date/Time: 12/12/22 1:25 PM   Performing Physician: Roanna Banning, MD Assistant(s): None  Diagnosis: R lung mass and supraclavicular adenopathy   Procedure: RIGHT SUPRACLAVICULAR LYMPH NODE BIOPSY  Anesthesia: Local Anesthetic Complications: None Estimated Blood Loss: Minimal Specimens: Sent for Pathology  Findings:  Successful Ultrasound-guided biopsy of R supraclavicular LNs. A total of 3 samples were obtained. Hemostasis of the tract was achieved using Manual Pressure.  Plan: Bed rest for 0 hours.  See detailed procedure note with images in PACS. The patient tolerated the procedure well without incident or complication and was returned to Recovery in stable condition.    Roanna Banning, MD [facility] Inter[facility] Radiology Specialists [facility] Radiology   Pager. [phone] Clinic. [phone]

## 2022-12-13 ENCOUNTER — Other Ambulatory Visit: Payer: Self-pay

## 2022-12-13 ENCOUNTER — Encounter: Payer: Self-pay | Admitting: Hematology

## 2022-12-13 ENCOUNTER — Other Ambulatory Visit: Payer: Self-pay | Admitting: Radiology

## 2022-12-13 ENCOUNTER — Other Ambulatory Visit: Payer: Self-pay | Admitting: Hematology

## 2022-12-13 DIAGNOSIS — C3431 Malignant neoplasm of lower lobe, right bronchus or lung: Secondary | ICD-10-CM

## 2022-12-13 DIAGNOSIS — C3491 Malignant neoplasm of unspecified part of right bronchus or lung: Secondary | ICD-10-CM

## 2022-12-13 DIAGNOSIS — Z95828 Presence of other vascular implants and grafts: Secondary | ICD-10-CM

## 2022-12-13 LAB — SURGICAL PATHOLOGY

## 2022-12-13 MED ORDER — MISC. DEVICES MISC: 99 refills | Status: DC

## 2022-12-13 NOTE — Progress Notes (Signed)
Order placed for IR Port per Dr. Ellin Saba.

## 2022-12-13 NOTE — H&P (Addendum)
Referring Physician(s): Doreatha Massed  Supervising Physician: Roanna Banning  Patient Status:  [facility] OP   Chief Complaint:  "I'm getting a port a cath"  Subjective: Pt known to IR team from rt supraclavicular LN bx on 12/12/22. He is a 62 yo male , ex smoker, with PMH sig for ALS, asthma, COPD, DDD, CAD, depression, CVA, pulmonary fibrosis, migraines. 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 He is scheduled today for port a cath placement to assist with treatment. He denies fever,HA,abd pain,N/V or bleeding. He does have CP, dyspnea -on home O2, occ cough, back pain.      Past Medical History:  Diagnosis Date   ALS (amyotrophic lateral sclerosis) (HCC)    Asthma    Back pain    COPD (chronic obstructive pulmonary disease) (HCC)    Coronary artery calcification seen on CT scan    DDD (degenerative disc disease), lumbar    Depression    Dysphagia    Following previous surgery   History of blood transfusion 2014   Post-op bleed   History of lung cancer    History of stroke 2015   Idiopathic pulmonary fibrosis (HCC)    Migraine    Spider bite 2012   Past Surgical History:  Procedure Laterality Date   ANTERIOR CERVICAL DECOMP/DISCECTOMY FUSION  12/22/2011   Procedure: ANTERIOR CERVICAL DECOMPRESSION/DISCECTOMY FUSION 3 LEVELS;  Surgeon: Venita Lick, MD;  Location: [facility] OR;  Service: Orthopedics;  Laterality: N/A;  ACDF C5-T1   ANTERIOR CERVICAL DECOMP/DISCECTOMY FUSION  06/25/2012   Procedure: ANTERIOR CERVICAL DECOMPRESSION/DISCECTOMY FUSION 1 LEVEL/HARDWARE REMOVAL;  Surgeon: Kerrin Champagne, MD;  Location: [facility] OR;  Service: Orthopedics;  Laterality: N/A;  Removal anterior cervical plate Z6-X0, Explore Fusion, Left C5-6, C6-7, C7-T1 Re-do Foraminotomy, Left open carpal tunnel release with release of ulnar nerve at [facility], Posterior Cervical Fusion with lateral mass screw   ANTERIOR CERVICAL  DECOMP/DISCECTOMY FUSION N/A 03/25/2021   Procedure: ANTERIOR CERVICAL DECOMPRESSION/DISCECTOMY FUSION 2 LEVELS  C3-5;  Surgeon: Venita Lick, MD;  Location: [facility] OR;  Service: Orthopedics;  Laterality: N/A;  ANTERIOR CERVICAL DECOMPRESSION/DISCECTOMY FUSION 2 LEVELS  C3-5   BACK SURGERY     CARDIAC CATHETERIZATION     CARPAL TUNNEL RELEASE  06/25/2012   Procedure: CARPAL TUNNEL RELEASE;  Surgeon: Kerrin Champagne, MD;  Location: [facility] OR;  Service: Orthopedics;  Laterality: Left;  as above   CHOLECYSTECTOMY  2009   CORONARY ANGIOPLASTY  2001   [facility] [place]   LUMBAR FUSION  2000   POSTERIOR CERVICAL FUSION/FORAMINOTOMY  06/25/2012   Procedure: POSTERIOR CERVICAL FUSION/FORAMINOTOMY LEVEL 1;  Surgeon: Kerrin Champagne, MD;  Location: [facility] OR;  Service: Orthopedics;  Laterality: N/A;  as above   POSTERIOR CERVICAL FUSION/FORAMINOTOMY N/A 12/17/2013   Procedure: REMOVAL OF POSTERIOR CERVICAL FUSION LATERAL MASS SCREWS AND RODS C5-T1, EXPLORE FUSION, LEFT SIX-SEVEN FORAMINOTOMY;  Surgeon: Kerrin Champagne, MD;  Location: [facility] OR;  Service: Orthopedics;  Laterality: N/A;   VASECTOMY  1990      Allergies: Pirfenidone  Medications: Prior to Admission medications   Medication Sig Start Date End Date Taking? Authorizing Provider  Misc. Devices MISC Patient require 6L Nasal Cannula Oxygen 12/13/22   Doreatha Massed, MD  albuterol (PROVENTIL) (2.5 MG/3ML) 0.083% nebulizer solution Take 3 mLs (2.5 mg total) by nebulization every 6 (six) hours as needed for wheezing or shortness of breath. 11/24/22   Glenford Bayley, NP  albuterol (VENTOLIN HFA) 108 (  90 Base) MCG/ACT inhaler Inhale 2 puffs into the lungs every 6 (six) hours as needed for wheezing or shortness of breath. 11/24/22   Glenford Bayley, NP  benzonatate (TESSALON) 200 MG capsule Take 1 capsule (200 mg total) by mouth 3 (three) times daily as needed. 11/18/22   [name], FNP  blood glucose meter kit and supplies Dispense based on patient and  insurance preference. Use up to four times daily as directed. (FOR ICD-10 E10.9, E11.9). 05/18/21   [name], FNP  busPIRone (BUSPAR) 5 MG tablet Take 1 tablet (5 mg total) by mouth 3 (three) times daily. 08/31/21   [name], FNP  DULoxetine (CYMBALTA) 60 MG capsule Take 1 capsule (60 mg total) by mouth daily. 09/01/22   [name], FNP  Ensure Max Protein (ENSURE MAX PROTEIN) LIQD Take 330 mLs by mouth 3 (three) times daily. 11/18/22   [name], FNP  fexofenadine (ALLEGRA) 180 MG tablet Take 1 tablet (180 mg total) by mouth daily. 02/28/22   [name] A, FNP  ibuprofen (ADVIL) 800 MG tablet Take 1 tablet (800 mg total) by mouth every 8 (eight) hours as needed. 11/22/22   [name], FNP  LORazepam (ATIVAN) 0.5 MG tablet Take 1 tablet (0.5 mg total) by mouth once as needed for up to 1 dose for anxiety. Take 1hr prior to scans 12/08/22   Doreatha Massed, MD  magic mouthwash w/lidocaine SOLN Take 10 mLs by mouth 3 (three) times daily as needed for mouth pain. 11/18/22   [name], FNP  methocarbamol (ROBAXIN) 500 MG tablet Take 1 tablet (500 mg total) by mouth 2 (two) times daily as needed for muscle spasms. 02/28/22   [name], FNP  metoprolol tartrate (LOPRESSOR) 50 MG tablet Take 0.5 tablets (25 mg total) by mouth 2 (two) times daily. 11/11/22   Johnson, Clanford L, MD  naloxone [facility]) nasal spray 4 mg/0.1 mL Place 1 spray into the nose as needed (opioid overdose).    [provider]  Nintedanib (OFEV) 150 MG CAPS Take 1 capsule (150 mg total) by mouth 2 (two) times daily. 11/29/22   Glenford Bayley, NP  nortriptyline (PAMELOR) 50 MG capsule Take 50 mg by mouth 2 (two) times daily. 11/28/16   [provider]  oxyCODONE (ROXICODONE) 15 MG immediate release tablet Take 15 mg by mouth 4 (four) times daily. 06/22/18   [provider]  oxyCODONE-acetaminophen (PERCOCET) 10-325 MG tablet Take 1 tablet by mouth 3 (three) times  daily as needed. 02/28/22   [name] A, FNP  sildenafil (REVATIO) 20 MG tablet Take by mouth. 11/28/16   [provider]  topiramate (TOPAMAX) 50 MG tablet Take by mouth. 11/28/16   [provider]  traZODone (DESYREL) 50 MG tablet Take 2 tablets (100 mg total) by mouth at bedtime. 11/11/22   Cleora Fleet, MD     Vital Signs: Vitals:   12/14/22 0741  BP: 109/76  Pulse: (!) 115  Resp: 20  Temp: 98.3 F (36.8 C)  SpO2: 99%       Code Status: FULL CODE  Physical Exam: awake/alert; chest- scattered wheezes bilat; heart- tachy but reg rhythm; abd- soft,+BS,NT; no LE edema  Imaging: [place] CORE BIOPSY (LYMPH NODES)  Result Date: 12/12/2022 INDICATION: R Lung Mass with R Supraclavicular Lymph Node EXAM: ULTRASOUND-GUIDED RIGHT SUPRACLAVICULAR LYMPH NODE BIOPSY COMPARISON:  CT chest, 11/24/2022. MEDICATIONS: 5 mL lidocaine 1% ANESTHESIA/SEDATION: Local anesthetic was administered. COMPLICATIONS: None  immediate. TECHNIQUE: Informed written consent was obtained from the patient and/or patient's representative after a discussion of the risks, benefits and alternatives to treatment. Questions regarding the procedure were encouraged and answered. Initial ultrasound scanning demonstrated enlarged RIGHT supraclavicular lymph mass. An ultrasound image was saved for documentation purposes. The procedure was planned. A timeout was performed prior to the initiation of the procedure. The operative was prepped and draped in the usual sterile fashion, and a sterile drape was applied covering the operative field. A timeout was performed prior to the initiation of the procedure. Local anesthesia was provided with 1% lidocaine. Under direct ultrasound guidance, an 18 gauge core needle device was utilized to obtain to obtain 4 core needle biopsies of the RIGHT supraclavicular lymph node. The samples were placed in saline and submitted to pathology. The needle was removed and superficial  hemostasis was achieved with manual compression. Post procedure scan was negative for significant hematoma. A dressing was applied. The patient tolerated the procedure well without immediate postprocedural complication. IMPRESSION: Successful ultrasound guided biopsy of the enlarged RIGHT supraclavicular lymph node. Roanna Banning, MD Vascular and Interventional Radiology Specialists [facility] Radiology Electronically Signed   By: Roanna Banning M.D.   On: 12/12/2022 14:46    Labs:  CBC: Recent Labs    11/08/22 1845 11/09/22 0514 11/10/22 0441 11/18/22 1226  WBC 16.4* 12.2* 24.8* 15.3*  HGB 11.7* 11.6* 11.4* 12.8*  HCT 37.2* 36.9* 36.9* 39.2  PLT 385 374 411* 514*    COAGS: Recent Labs    11/08/22 1845  INR 1.3*  APTT 43*    BMP: Recent Labs    06/13/22 2051 06/17/22 1032 09/01/22 1150 11/08/22 1845 11/09/22 0514 11/18/22 1226  NA 137   < > 142 138 138 146*  K 3.6   < > 4.4 3.5 3.5 4.1  CL 103   < > 104 102 105 103  CO2 24   < > 23 27 27  30*  GLUCOSE 147*   < > 70 111* 145* 103*  BUN 11   < > 10 9 11 11   CALCIUM 9.1   < > 9.7 8.7* 8.5* 9.1  CREATININE 0.97   < > 0.80 0.69 0.58* 0.83  GFRNONAA >60  --   --  >60 >60  --    < > = values in this interval not displayed.    LIVER FUNCTION TESTS: Recent Labs    09/01/22 1150 11/08/22 1845 11/09/22 0514 11/18/22 1226  BILITOT <0.2 1.0 0.6 0.4  AST 13 23 17 30   ALT 11 31 27  81*  ALKPHOS 97 110 100 99  PROT 7.4 7.0 6.5 6.5  ALBUMIN 4.0 2.6* 2.5* 3.5*    Assessment and Plan: 62 yo male , ex smoker, with PMH sig for ALS, asthma, COPD, DDD, CAD, depression, CVA, pulmonary fibrosis, migraines. 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 He is scheduled today for port a cath placement to assist with treatment.Risks and benefits of image guided port-a-catheter placement was discussed with the patient/spouse including, but not limited to bleeding, infection,  pneumothorax, or fibrin sheath development and need for additional procedures.  All of the patient's questions were answered, patient is agreeable to proceed. Consent signed and in chart.    Electronically Signed: D. Jeananne Rama, PA-C 12/13/2022, 3:15 PM   I spent a total of 25 minutes at the the patient's bedside AND on the patient's hospital floor or unit, greater than 50% of which  was counseling/coordinating care for port a cath placement

## 2022-12-13 NOTE — Progress Notes (Signed)
START ON PATHWAY REGIMEN - Small Cell Lung     Cycles 1 through 4: A cycle is every 21 days:     Durvalumab      Carboplatin      Etoposide    Cycles 5 and beyond: A cycle is every 28 days:     Durvalumab   **Always confirm dose/schedule in your pharmacy ordering system**  Patient Characteristics: Newly Diagnosed, Preoperative or Nonsurgical Candidate (Clinical Staging), First Line, Extensive Stage Therapeutic Status: Newly Diagnosed, Preoperative or Nonsurgical Candidate (Clinical Staging) AJCC T Category: cT3 AJCC N Category: cN3 AJCC M Category: cM0 AJCC 8 Stage Grouping: IIIC Stage Classification: Extensive  Intent of Therapy: Non-Curative / Palliative Intent, Discussed with Patient

## 2022-12-13 NOTE — Patient Instructions (Signed)
[facility] Chemotherapy Teaching   You will see the doctor regularly throughout treatment.  We will obtain blood work from you prior to every treatment and monitor your results to make sure it is safe to give your treatment. The doctor monitors your response to treatment by the way you are feeling, your blood work, and by obtaining scans periodically.  There will be wait times while you are here for treatment.  It will take about 30 minutes to 1 hour for your lab work to result.  Then there will be wait times while pharmacy mixes your medications.         Atezolizumab Nancie Neas)  About This Drug Lacretia Nicks is used to treat cancer. It is given by the vein (IV).  Possible Side Effects  Nausea  Tiredness and weakness  Decreased appetite (decreased hunger)  Cough  Trouble breathing  Note: Each of the side effects above was reported in 20% or greater of patients treated with atezolizumab. Your side effects may be different if you are taking atezolizumab in combination with another agent. Not all possible side effects are included above.  Warnings and Precautions  This drug works with your immune system and can cause inflammation (swelling) in any of your organs and tissues and can change how they work. This may put you at risk for developing serious medical problems, which can be life-threatening.   Inflammation of the lungs which can be life-threatening. You may have a dry cough or trouble breathing.   Severe changes in your liver function which can cause liver failure and be life-threatening.   Colitis which is swelling in the colon. The symptoms are diarrhea, stomach cramping, and sometimes blood in the bowel movements.   Changes in your central nervous system can happen. The central nervous system is made up of your brain and spinal cord. You could feel extreme tiredness, agitation, confusion, hallucinations (see or hear things that are not there), trouble  understanding or speaking, loss of control of your bowels or bladder, eyesight changes, numbness or lack of strength to your arms, legs, face, or body, and coma. If you start to have any of these symptoms let your doctor know right away.   This drug may affect your hormone glands (thyroid, adrenals, pituitary and pancreas).   Blood sugar levels may change, and you may develop diabetes. If you already have diabetes, changes may need to be made to your diabetes medication.   Severe infections, including viral, bacterial and fungal, which can be life-threatening   While you are getting this drug in your vein (IV), you may have a reaction to the drug. Sometimes you may be given medication to stop or lessen these side effects. Your nurse will check you closely for these signs: fever or shaking chills, flushing, facial swelling, feeling dizzy, headache, trouble breathing, rash, itching, chest tightness, or chest pain. These reactions may happen after your infusion. If this happens, call 911 for emergency care.  Note: Some of the side effects above are very rare. If you have concerns and/or questions, please discuss them with your medical team.  Important Information  This drug may be present in the saliva, tears, sweat, urine, stool, vomit, semen, and vaginal secretions. Talk to your doctor and/or your nurse about the necessary precautions to take during this time.   Treating Side Effects   Manage tiredness by pacing your activities for the day.   Be sure to include periods of rest between energy-draining activities.   To  decrease the risk of infection, wash your hands regularly.   Avoid close contact with people who have a cold, the flu, or other infections.   Take your temperature as your doctor or nurse tells you, and whenever you feel like you may have a fever.   Drink plenty of fluids (a minimum of eight glasses per day is recommended).   Ask your doctor or nurse about medicine that is  available to help stop or lessen loose bowel movements.   To help with nausea, eat small, frequent meals instead of three large meals a day. Choose foods and drinks that are at room temperature. Ask your nurse or doctor about other helpful tips and medicine that is available to help stop or lessen these symptoms.  To help with decreased appetite, eat small, frequent meals. Eat foods high in calories and protein, such as meat, poultry, fish, dry beans, tofu, eggs, nuts, milk, yogurt, cheese, ice cream, pudding, and nutritional supplements.   Consider using sauces and spices to increase taste. Daily exercise, with your doctor's approval, may increase your appetite.   If you have diabetes, keep good control of your blood sugar level. Tell your nurse or your doctor if your glucose levels are higher or lower than normal.   Keeping your pain under control is important to your well-being. Please tell your doctor or nurse if you are experiencing pain.   If you get a rash do not put anything on it unless your doctor or nurse says you may. Keep the area around the rash clean and dry. Ask your doctor for medicine if your rash bothers you.   If you have numbness and tingling in your hands and feet, be careful when cooking, walking, and handling sharp objects and hot liquids.   Infusion reactions may happen after your infusion. If this happens, call 911 for emergency care.  Food and Drug Interactions  There are no known interactions of atezolizumab with food.   This drug may interact with other medicines. Tell your doctor and pharmacist about all the prescription and over-the-counter medicines and dietary supplements (vitamins, minerals, herbs and others) that you are taking at this time. Also, check with your doctor or pharmacist before starting any new prescription or over-the-counter medicines, or dietary supplements to make sure that there are no interactions.  When to Call the Doctor  Call your  doctor or nurse if you have any of these symptoms and/or any new or unusual symptoms:   Fever of 100.4 F (38 C) or higher   Chills   Tiredness that interferes with your daily activities   Feeling dizzy or lightheaded   Pain in your chest   Dry cough   Coughing up yellow, green, or bloody mucus   Wheezing or trouble breathing   Feeling that your heart is beating in a fast or not normal way (palpitations)   Confusion and/or agitation   Hallucinations   Trouble understanding or speaking   Numbness or lack of strength to your arms, legs, face, or body   Blurred vision or other changes in eyesight   Diarrhea, 4 times in one day or diarrhea with lack of strength or a feeling of being dizzy   Pain in your abdomen that does not go away   Blood in your stool   Nausea that stops you from eating or drinking and/or is not relieved by prescribed medicines   Throwing up   Lasting loss of appetite or rapid weight loss of five  pounds in a week   Abnormal blood sugar   Unusual thirst, passing urine often, headache, sweating, shakiness, irritability   Pain that does not go away, or is not relieved by prescribed medicines   Numbness, tingling, or pain your hands and feet   Extreme weakness that interferes with normal activities   A new rash or a rash that is not relieved by prescribed medicines   Signs of infusion reaction: fever or shaking chills, flushing, facial swelling, feeling dizzy, headache, trouble breathing, rash, itching, chest tightness, or chest pain. If this happens, call 911 for emergency care.   Signs of possible liver problems: dark urine, pale bowel movements, bad stomach pain, feeling very tired and weak, unusual itching, or yellowing of the eyes or skin   If you think you may be pregnant  Reproduction Warnings   Pregnancy warning: This drug can have harmful effects on the unborn baby. Women of childbearing potential should use effective methods of  birth control during your cancer treatment and for at least 5 months after treatment. Let your doctor know right away if you think you may be pregnant.   Breastfeeding warning: It is not known if this drug passes into breast milk. For this reason, Women should not breastfeed during treatment and for at least 5 months after treatment because this drug could enter the breast milk and cause harm to a breastfeeding baby.   Fertility warning: In women, this drug may affect your ability to have children in the future. Talk with your doctor or nurse if you plan to have children. Ask for information on egg banking.  Carboplatin (Paraplatin, CBDCA)  About This Drug  Carboplatin is used to treat cancer. It is given in the vein (IV).  This drug will take 30 minutes to infuse.   Possible Side Effects   Bone marrow suppression. This is a decrease in the number of white blood cells, red blood cells, and platelets. This may raise your risk of infection, make you tired and weak (fatigue), and raise your risk of bleeding.   Nausea and vomiting (throwing up)   Weakness   Changes in your liver function   Changes in your kidney function   Electrolyte changes   Pain  Note: Each of the side effects above was reported in 20% or greater of patients treated with carboplatin. Not all possible side effects are included above.  Warnings and Precautions   Severe bone marrow suppression   Allergic reactions, including anaphylaxis are rare but may happen in some patients. Signs of allergic reaction to this drug may be swelling of the face, feeling like your tongue or throat are swelling, trouble breathing, rash, itching, fever, chills, feeling dizzy, and/or feeling that your heart is beating in a fast or not normal way. If this happens, do not take another dose of this drug. You should get urgent medical treatment.   Severe nausea and vomiting   Effects on the nerves are called peripheral neuropathy. This  risk is increased if you are over the age of 28 or if you have received other medicine with risk of peripheral neuropathy. You may feel numbness, tingling, or pain in your hands and feet. It may be hard for you to button your clothes, open jars, or walk as usual. The effect on the nerves may get worse with more doses of the drug. These effects get better in some people after the drug is stopped but it does not get better in all people.  Blurred vision, loss of vision or other changes in eyesight   Decreased hearing   - Skin and tissue irritation including redness, pain, warmth, or swelling at the IV site if the drug leaks out of the vein and into nearby tissue.   Severe changes in your kidney function, which can cause kidney failure   Severe changes in your liver function, which can cause liver failure  Note: Some of the side effects above are very rare. If you have concerns and/or questions, please discuss them with your medical team.  Important Information   This drug may be present in the saliva, tears, sweat, urine, stool, vomit, semen, and vaginal secretions. Talk to your doctor and/or your nurse about the necessary precautions to take during this time.  Treating Side Effects   Manage tiredness by pacing your activities for the day.   Be sure to include periods of rest between energy-draining activities.   To decrease the risk of infection, wash your hands regularly.   Avoid close contact with people who have a cold, the flu, or other infections.   Take your temperature as your doctor or nurse tells you, and whenever you feel like you may have a fever.   To help decrease the risk of bleeding, use a soft toothbrush. Check with your nurse before using dental floss.   Be very careful when using knives or tools.   Use an electric shaver instead of a razor.   Drink plenty of fluids (a minimum of eight glasses per day is recommended).   If you throw up or have loose bowel  movements, you should drink more fluids so that you do not become dehydrated (lack of water in the body from losing too much fluid).   To help with nausea and vomiting, eat small, frequent meals instead of three large meals a day. Choose foods and drinks that are at room temperature. Ask your nurse or doctor about other helpful tips and medicine that is available to help stop or lessen these symptoms.   If you have numbness and tingling in your hands and feet, be careful when cooking, walking, and handling sharp objects and hot liquids.   Keeping your pain under control is important to your well-being. Please tell your doctor or nurse if you are experiencing pain.  Food and Drug Interactions  There are no known interactions of carboplatin with food.   This drug may interact with other medicines. Tell your doctor and pharmacist about all the prescription and over-the-counter medicines and dietary supplements (vitamins, minerals, herbs and others) that you are taking at this time. Also, check with your doctor or pharmacist before starting any new prescription or over-the-counter medicines, or dietary supplements to make sure that there are no interactions.  When to Call the Doctor  Call your doctor or nurse if you have any of these symptoms and/or any new or unusual symptoms:   Fever of 100.4 F (38 C) or higher   Chills   Tiredness that interferes with your daily activities   Feeling dizzy or lightheaded   Easy bleeding or bruising   Nausea that stops you from eating or drinking and/or is not relieved by prescribed medicines   Throwing up   Blurred vision or other changes in eyesight   Decrease in hearing or ringing in the ear   Signs of allergic reaction: swelling of the face, feeling like your tongue or throat are swelling, trouble breathing, rash, itching, fever, chills, feeling dizzy, and/or feeling  that your heart is beating in a fast or not normal way. If this happens, call  911 for emergency care.   Signs of possible liver problems: dark urine, pale bowel movements, bad stomach pain, feeling very tired and weak, unusual itching, or yellowing of the eyes or skin   Decreased urine, or very dark urine   Numbness, tingling, or pain in your hands and feet   Pain that does not go away or is not relieved by prescribed medicine   While you are getting this drug, please tell your nurse right away if you have any pain, redness, or swelling at the site of the IV infusion, or if you have any new onset of symptoms, or if you just feel "different" from before when the infusion was started.   Reproduction Warnings   Pregnancy warning: This drug may have harmful effects on the unborn baby. Women of child bearing potential should use effective methods of birth control during your cancer treatment. Let your doctor know right away if you think you may be pregnant.   Breastfeeding warning: It is not known if this drug passes into breast milk. For this reason, women should not breastfeed during treatment because this drug could enter the breast milk and cause harm to a breastfeeding baby.   Fertility warning: Human fertility studies have not been done with this drug. Talk with your doctor or nurse if you plan to have children. Ask for information on sperm or egg banking.  Etoposide  About This Drug  Etoposide is used to treat cancer. It is given in the vein (IV).  This drug will take 1 hour to infuse.    Possible Side Effects   Bone marrow suppression. This is a decrease in the number of white blood cells, red blood cells, and platelets. This may raise your risk of infection, make you tired and weak (fatigue), and raise your risk of bleeding.   Nausea and vomiting (throwing up)   Hair loss. Hair loss is often temporary, although with certain medicine, hair loss can sometimes be permanent. Hair loss may happen suddenly or gradually. If you lose hair, you may lose it from  your head, face, armpits, pubic area, chest, and/or legs. You may also notice your hair getting thin.  Note: Each of the side effects above was reported in 20% or greater of patients treated with etoposide. Not all possible side effects are included above.  Warnings and Precautions   Severe bone marrow suppression, which may be life-threatening   This drug may raise your risk of getting a second cancer, such as leukemia   Low blood pressure with rapid infusion of the medication   Allergic reactions, including anaphylaxis are rare but may happen in some patients. Signs of allergic reaction to this drug may be swelling of the face, feeling like your tongue or throat are swelling, trouble breathing, rash, itching, fever, chills, feeling dizzy, and/or feeling that your heart is beating in a fast or not normal way. If this happens, do not take another dose of this drug. You should get urgent medical treatment.   These side effects may be more severe if you are receiving high doses of this medication included in pre-transplant chemotherapy.  Treating Side Effects   Manage tiredness by pacing your activities for the day.   Be sure to include periods of rest between energy-draining activities.   To decrease the risk of infection, wash your hands regularly.   Avoid close contact with people  who have a cold, the flu, or other infections.   Take your temperature as your doctor or nurse tells you, and whenever you feel like you may have a fever.   To help decrease bleeding, use a soft toothbrush. Check with your nurse before using dental floss.   Be very careful when using knives or tools.   Use an electric shaver instead of a razor.   Drink plenty of fluids (a minimum of eight glasses per day is recommended).   If you throw up or have loose bowel movements, you should drink more fluids so that you do not become dehydrated (lack of water in the body from losing too much fluid).   To help  with nausea and vomiting, eat small, frequent meals instead of three large meals a day. Choose foods and drinks that are at room temperature. Ask your nurse or doctor about other helpful tips and medicine that is available to help or stop lessen these symptoms.   To help with hair loss, wash with a mild shampoo and avoid washing your hair every day.   Avoid rubbing your scalp, pat your hair or scalp dry.   Avoid coloring your hair.   Limit your use of hair spray, electric curlers, blow dryers, and curling irons.   If you are interested in getting a wig, talk to your nurse. You can also call the [employer] at 800-ACS-2345 to find out information about the "Look Good, Feel Better" program close to where you live. It is a free program where women getting chemotherapy can learn about wigs, turbans and scarves as well as makeup techniques and skin and nail care.  Food and Drug Interactions   There are no known interactions of etoposide with food.   This drug may interact with other medicines. Tell your doctor and pharmacist about all the medicines and dietary supplements (vitamins, minerals, herbs and others) that you are taking at this time. The safety and use of dietary supplements and alternative diets are often not known. Using these might affect your cancer or interfere with your treatment. Until more is known, you should not use dietary supplements or alternative diets without your cancer doctor's help.   There are known interactions of etoposide with blood thinning medicine such as warfarin. Ask your doctor what precautions you should take.  When to Call the Doctor  Call your doctor or nurse if you have any of these symptoms and/or any new or unusual symptoms:   Fever of 100.4 F (38 C) or higher   Chills   Tiredness that interferes with your daily activities   Feeling dizzy or lightheaded   Feeling that your heart is beating in a fast or not normal way  (palpitations)   Easy bleeding or bruising   Nausea that stops you from eating or drinking and/or is not relieved by prescribed medicines   Throwing up   Signs of allergic reaction: swelling of the face, feeling like your tongue or throat are swelling, trouble breathing, rash, itching, fever, chills, feeling dizzy, and/or feeling that your heart is beating in a fast or not normal way   If you think you may be pregnant or may have impregnated your partner  Reproduction Warnings   Pregnancy warning: This drug can have harmful effects on the unborn baby. Women of childbearing potential should use effective methods of birth control during your cancer treatment and for at least 6 months after treatment. Men with male partners of childbearing potential  should use effective methods of birth control during your cancer treatment and for at least 4 months after your cancer treatment. Let your doctor know right away if you think you may be pregnant or may have impregnated your partner.   Breastfeeding warning: It is not known if this drug passes into breast milk. For this reason, women should talk to their doctor about the risks and benefits of breastfeeding during treatment with this drug because this drug may enter the breast milk and cause harm to a breastfeeding baby.   Fertility warning: In men and women both, this drug may affect your ability to have children in the future. Talk with your doctor or nurse if you plan to have children. Ask for information on sperm or egg banking.   SELF CARE ACTIVITIES WHILE RECEIVING CHEMOTHERAPY:  Hydration Increase your fluid intake 48 hours prior to treatment and drink at least 8 to 12 cups (64 ounces) of water/decaffeinated beverages per day after treatment. You can still have your cup of coffee or soda but these beverages do not count as part of your 8 to 12 cups that you need to drink daily. No alcohol intake.  Medications Continue taking your normal  prescription medication as prescribed.  If you start any new herbal or new supplements please let us know first to make sure it is safe.  Mouth Care Have teeth cleaned professionally before starting treatment. Keep dentures and partial plates clean. Use soft toothbrush and do not use mouthwashes that contain alcohol. Biotene is a good mouthwash that is available at most pharmacies or may be ordered by calling [phone]. Use warm salt water gargles (1 teaspoon salt per 1 quart warm water) before and after meals and at bedtime. If you need dental work, please let the doctor know before you go for your appointment so that we can coordinate the best possible time for you in regards to your chemo regimen. You need to also let your dentist know that you are actively taking chemo. We may need to do labs prior to your dental appointment.  Skin Care Always use sunscreen that has not expired and with SPF (Sun Protection Factor) of 50 or higher. Wear hats to protect your head from the sun. Remember to use sunscreen on your hands, ears, face, & feet.  Use good moisturizing lotions such as udder cream, eucerin, or even Vaseline. Some chemotherapies can cause dry skin, color changes in your skin and nails.    Avoid long, hot showers or baths. Use gentle, fragrance-free soaps and laundry detergent. Use moisturizers, preferably creams or ointments rather than lotions because the thicker consistency is better at preventing skin dehydration. Apply the cream or ointment within 15 minutes of showering. Reapply moisturizer at night, and moisturize your hands every time after you wash them.  Hair Loss (if your doctor says your hair will fall out)  If your doctor says that your hair is likely to fall out, decide before you begin chemo whether you want to wear a wig. You may want to shop before treatment to match your hair color. Hats, turbans, and scarves can also camouflage hair loss, although some people prefer to  leave their heads uncovered. If you go bare-headed outdoors, be sure to use sunscreen on your scalp. Cut your hair short. It eases the inconvenience of shedding lots of hair, but it also can reduce the emotional impact of watching your hair fall out. Don't perm or color your hair during chemotherapy. Those chemical  treatments are already damaging to hair and can enhance hair loss. Once your chemo treatments are done and your hair has grown back, it's OK to resume dyeing or perming hair.  With chemotherapy, hair loss is almost always temporary. But when it grows back, it may be a different color or texture. In older adults who still had hair color before chemotherapy, the new growth may be completely gray.  Often, new hair is very fine and soft.  Infection Prevention Please wash your hands for at least 30 seconds using warm soapy water. Handwashing is the #1 way to prevent the spread of germs. Stay away from sick people or people who are getting over a cold. If you develop respiratory systems such as green/yellow mucus production or productive cough or persistent cough let us know and we will see if you need an antibiotic. It is a good idea to keep a pair of gloves on when going into grocery stores/[employer] to decrease your risk of coming into contact with germs on the carts, etc. Carry alcohol hand gel with you at all times and use it frequently if out in public. If your temperature reaches 100.5 or higher please call the clinic and let us know.  If it is after hours or on the weekend please go to the ER if your temperature is over 100.5.  Please have your own personal thermometer at home to use.    Sex and bodily fluids If you are going to have sex, a condom must be used to protect the person that isn't taking chemotherapy. Chemo can decrease your libido (sex drive). For a few days after chemotherapy, chemotherapy can be excreted through your bodily fluids.  When using the toilet please close the lid and  flush the toilet twice.  Do this for a few day after you have had chemotherapy.   Effects of chemotherapy on your sex life Some changes are simple and won't last long. They won't affect your sex life permanently.  Sometimes you may feel: too tired not strong enough to be very active sick or sore  not in the mood anxious or low Your anxiety might not seem related to sex. For example, you may be worried about the cancer and how your treatment is going. Or you may be worried about money, or about how you family are coping with your illness.  These things can cause stress, which can affect your interest in sex. It's important to talk to your partner about how you feel.  Remember - the changes to your sex life don't usually last long. There's usually no medical reason to stop having sex during chemo. The drugs won't have any long term physical effects on your performance or enjoyment of sex. Cancer can't be passed on to your partner during sex  Contraception It's important to use reliable contraception during treatment. Avoid getting pregnant while you or your partner are having chemotherapy. This is because the drugs may harm the baby. Sometimes chemotherapy drugs can leave a man or woman infertile.  This means you would not be able to have children in the future. You might want to talk to someone about permanent infertility. It can be very difficult to learn that you may no longer be able to have children. Some people find counselling helpful. There might be ways to preserve your fertility, although this is easier for men than for women. You may want to speak to a fertility expert. You can talk about sperm banking or harvesting  your eggs. You can also ask about other fertility options, such as donor eggs. If you have or have had breast cancer, your doctor might advise you not to take the contraceptive pill. This is because the hormones in it might affect the cancer. It is not known for sure whether or not  chemotherapy drugs can be passed on through semen or secretions from the vagina. Because of this some doctors advise people to use a barrier method if you have sex during treatment. This applies to vaginal, anal or oral sex. Generally, doctors advise a barrier method only for the time you are actually having the treatment and for about a week after your treatment. Advice like this can be worrying, but this does not mean that you have to avoid being intimate with your partner. You can still have close contact with your partner and continue to enjoy sex.  Animals If you have cats or birds we just ask that you not change the litter or change the cage.  Please have someone else do this for you while you are on chemotherapy.   Food Safety During and After Cancer Treatment Food safety is important for people both during and after cancer treatment. Cancer and cancer treatments, such as chemotherapy, radiation therapy, and stem cell/bone marrow transplantation, often weaken the immune system. This makes it harder for your body to protect itself from foodborne illness, also called food poisoning. Foodborne illness is caused by eating food that contains harmful bacteria, parasites, or viruses.  Foods to avoid Some foods have a higher risk of becoming tainted with bacteria. These include: Unwashed fresh fruit and vegetables, especially leafy vegetables that can hide dirt and other contaminants Raw sprouts, such as alfalfa sprouts Raw or undercooked beef, especially ground beef, or other raw or undercooked meat and poultry Fatty, fried, or spicy foods immediately before or after treatment.  These can sit heavy on your stomach and make you feel nauseous. Raw or undercooked shellfish, such as oysters. Sushi and sashimi, which often contain raw fish.  Unpasteurized beverages, such as unpasteurized fruit juices, raw milk, raw yogurt, or cider Undercooked eggs, such as soft boiled, over easy, and poached; raw,  unpasteurized eggs; or foods made with raw egg, such as homemade raw cookie dough and homemade mayonnaise  Simple steps for food safety  Shop smart. Do not buy food stored or displayed in an unclean area. Do not buy bruised or damaged fruits or vegetables. Do not buy cans that have cracks, dents, or bulges. Pick up foods that can spoil at the end of your shopping trip and store them in a cooler on the way home.  Prepare and clean up foods carefully. Rinse all fresh fruits and vegetables under running water, and dry them with a clean towel or paper towel. Clean the top of cans before opening them. After preparing food, wash your hands for 20 seconds with hot water and soap. Pay special attention to areas between fingers and under nails. Clean your utensils and dishes with hot water and soap. Disinfect your kitchen and cutting boards using 1 teaspoon of liquid, unscented bleach mixed into 1 quart of water.    Dispose of old food. Eat canned and packaged food before its expiration date (the "use by" or "best before" date). Consume refrigerated leftovers within 3 to 4 days. After that time, throw out the food. Even if the food does not smell or look spoiled, it still may be unsafe. Some bacteria, such as Listeria, can  grow even on foods stored in the refrigerator if they are kept for too long.  Take precautions when eating out. At restaurants, avoid buffets and salad bars where food sits out for a long time and comes in contact with many people. Food can become contaminated when someone with a virus, often a norovirus, or another "bug" handles it. Put any leftover food in a "to-go" container yourself, rather than having the server do it. And, refrigerate leftovers as soon as you get home. Choose restaurants that are clean and that are willing to prepare your food as you order it cooked.   AT HOME MEDICATIONS:                                                                                                                                                                 Compazine/Prochlorperazine 10mg  tablet. Take 1 tablet every 6 hours as needed for nausea/vomiting. (This can make you sleepy)   EMLA cream. Apply a quarter size amount to port site 1 hour prior to chemo. Do not rub in. Cover with plastic wrap.    Diarrhea Sheet   If you are having loose stools/diarrhea, please purchase Imodium and begin taking as outlined:  At the first sign of poorly formed or loose stools you should begin taking Imodium (loperamide) 2 mg capsules.  Take two tablets (4mg ) followed by one tablet (2mg ) every 2 hours - DO NOT EXCEED 8 tablets in 24 hours.  If it is bedtime and you are having loose stools, take 2 tablets at bedtime, then 2 tablets every 4 hours until morning.   Always call the Cancer Center if you are having loose stools/diarrhea that you can't get under control.  Loose stools/diarrhea leads to dehydration (loss of water) in your body.  We have other options of trying to get the loose stools/diarrhea to stop but you must let us know!   Constipation Sheet  Colace - 100 mg capsules - take 2 capsules daily.  If this doesn't help then you can increase to 2 capsules twice daily.  Please call if the above does not work for you. Do not go more than 2 days without a bowel movement.  It is very important that you do not become constipated.  It will make you feel sick to your stomach (nausea) and can cause abdominal pain and vomiting.  Nausea Sheet   Compazine/Prochlorperazine 10mg  tablet. Take 1 tablet every 6 hours as needed for nausea/vomiting (This can make you drowsy).  If you are having persistent nausea (nausea that does not stop) please call the Cancer Center and let us know the amount of nausea that you are experiencing.  If you begin to vomit, you need to call the Cancer Center and if it is the weekend  and you have vomited more than one time and can't get it to stop-go to the Emergency  Room.  Persistent nausea/vomiting can lead to dehydration (loss of fluid in your body) and will make you feel very weak and unwell. Ice chips, sips of clear liquids, foods that are at room temperature, crackers, and toast tend to be better tolerated.   SYMPTOMS TO REPORT AS SOON AS POSSIBLE AFTER TREATMENT:  FEVER GREATER THAN 100.5 F  CHILLS WITH OR WITHOUT FEVER  NAUSEA AND VOMITING THAT IS NOT CONTROLLED WITH YOUR NAUSEA MEDICATION  UNUSUAL SHORTNESS OF BREATH  UNUSUAL BRUISING OR BLEEDING  TENDERNESS IN MOUTH AND THROAT WITH OR WITHOUT   PRESENCE OF ULCERS  URINARY PROBLEMS  BOWEL PROBLEMS  UNUSUAL RASH      Wear comfortable clothing and clothing appropriate for easy access to any Portacath or PICC line. Let us know if there is anything that we can do to make your therapy better!    What to do if you need assistance after hours or on the weekends: CALL [phone].  HOLD on the line, do not hang up.  You will hear multiple messages but at the end you will be connected with a nurse triage line.  They will contact the doctor if necessary.  Most of the time they will be able to assist you.  Do not call the hospital operator.      I have been informed and understand all of the instructions given to me and have received a copy. I have been instructed to call the clinic [phone] or my family physician as soon as possible for continued medical care, if indicated. I do not have any more questions at this time but understand that I may call the Cancer Center or the Patient Navigator at [phone] during office hours should I have questions or need assistance in obtaining follow-up care.

## 2022-12-14 ENCOUNTER — Ambulatory Visit (HOSPITAL_COMMUNITY)
Admission: RE | Admit: 2022-12-14 | Discharge: 2022-12-14 | Disposition: A | Discharge status: Home / Self Care | Payer: 59 | Source: Ambulatory Visit | Attending: Hematology | Admitting: Hematology

## 2022-12-14 ENCOUNTER — Ambulatory Visit (HOSPITAL_COMMUNITY)
Admission: RE | Admit: 2022-12-14 | Discharge: 2022-12-14 | Disposition: A | Discharge status: Home / Self Care | Payer: 59 | Source: Ambulatory Visit

## 2022-12-14 ENCOUNTER — Other Ambulatory Visit: Payer: Self-pay

## 2022-12-14 ENCOUNTER — Encounter (HOSPITAL_COMMUNITY): Payer: Self-pay

## 2022-12-14 DIAGNOSIS — G1221 Amyotrophic lateral sclerosis: Secondary | ICD-10-CM | POA: Insufficient documentation

## 2022-12-14 DIAGNOSIS — Z8673 Personal history of transient ischemic attack (TIA), and cerebral infarction without residual deficits: Secondary | ICD-10-CM | POA: Diagnosis not present

## 2022-12-14 DIAGNOSIS — C3431 Malignant neoplasm of lower lobe, right bronchus or lung: Secondary | ICD-10-CM | POA: Diagnosis present

## 2022-12-14 DIAGNOSIS — I2510 Atherosclerotic heart disease of native coronary artery without angina pectoris: Secondary | ICD-10-CM | POA: Insufficient documentation

## 2022-12-14 DIAGNOSIS — J84112 Idiopathic pulmonary fibrosis: Secondary | ICD-10-CM | POA: Diagnosis not present

## 2022-12-14 DIAGNOSIS — F32A Depression, unspecified: Secondary | ICD-10-CM | POA: Diagnosis not present

## 2022-12-14 HISTORY — PX: IR IMAGING GUIDED PORT INSERTION: IMG5740

## 2022-12-14 MED ORDER — HEPARIN SOD (PORK) LOCK FLUSH 100 UNIT/ML IV SOLN
500.0000 [IU] | Freq: Once | INTRAVENOUS | Status: AC
  Administered 2022-12-14: 500 [IU]

## 2022-12-14 MED ORDER — LIDOCAINE-EPINEPHRINE 1 %-1:100000 IJ SOLN
20.0000 mL | Freq: Once | INTRAMUSCULAR | Status: AC
  Administered 2022-12-14: 20 mL

## 2022-12-14 MED ORDER — HEPARIN SOD (PORK) LOCK FLUSH 100 UNIT/ML IV SOLN
INTRAVENOUS | Status: AC
  Filled 2022-12-14: qty 5

## 2022-12-14 MED ORDER — MIDAZOLAM HCL 2 MG/2ML IJ SOLN
INTRAMUSCULAR | Status: AC
  Filled 2022-12-14: qty 2

## 2022-12-14 MED ORDER — LIDOCAINE-EPINEPHRINE 1 %-1:100000 IJ SOLN
INTRAMUSCULAR | Status: AC
  Filled 2022-12-14: qty 1

## 2022-12-14 MED ORDER — MIDAZOLAM HCL 2 MG/2ML IJ SOLN
INTRAMUSCULAR | Status: AC | PRN
  Administered 2022-12-14 (×4): .5 mg via INTRAVENOUS

## 2022-12-14 MED ORDER — LIDOCAINE-EPINEPHRINE 1 %-1:100000 IJ SOLN
20.0000 mL | Freq: Once | INTRAMUSCULAR | Status: AC
  Administered 2022-12-14: 8 mL

## 2022-12-14 MED ORDER — FENTANYL CITRATE (PF) 100 MCG/2ML IJ SOLN
INTRAMUSCULAR | Status: AC
  Filled 2022-12-14: qty 2

## 2022-12-14 MED ORDER — SODIUM CHLORIDE 0.9 % IV SOLN: INTRAVENOUS | Status: DC

## 2022-12-14 MED ORDER — FENTANYL CITRATE (PF) 100 MCG/2ML IJ SOLN
INTRAMUSCULAR | Status: AC | PRN
  Administered 2022-12-14: 50 ug via INTRAVENOUS
  Administered 2022-12-14 (×2): 25 ug via INTRAVENOUS

## 2022-12-14 NOTE — Procedures (Signed)
Vascular and Interventional Radiology Procedure Note  Patient: Bradley Rice DOB: [birth year] Medical Record Number: [id number] Note Date/Time: 12/14/22 9:12 AM   Performing Physician: Roanna Banning, MD Assistant(s): None  Diagnosis: Lung cancer  Procedure: PORT PLACEMENT  Anesthesia: Conscious Sedation Complications: None Estimated Blood Loss: Minimal  Findings:  Successful left-sided port placement, with the tip of the catheter in the proximal right atrium.  Plan: Catheter ready for use.  See detailed procedure note with images in PACS. The patient tolerated the procedure well without incident or complication and was returned to Recovery in stable condition.    Roanna Banning, MD [facility] Inter[facility] Radiology   Pager. [phone] Clinic. [phone]

## 2022-12-14 NOTE — Discharge Instructions (Signed)
Discharge Instructions:   Please call Interventional Radiology clinic [phone] with any questions or concerns.  You may remove your band aid and dressing and shower tomorrow.  Do not use EMLA / Lidocaine cream for 2 weeks post Port Insertion for this will remove the surgical glue.   Implanted Port Insertion, Care After The following information offers guidance on how to care for yourself after your procedure. Your health care provider may also give you more specific instructions. If you have problems or questions, contact your health care provider. What can I expect after the procedure? After the procedure, it is common to have: Discomfort at the port insertion site. Bruising on the skin over the port. This should improve over 3-4 days. Follow these instructions at home: [facility] care After your port is placed, you will get a manufacturer's information card. The card has information about your port. Keep this card with you at all times. Take care of the port as told by your health care provider. Ask your health care provider if you or a family member can get training for taking care of the port at home. A home health care nurse will be be available to help care for the port. Make sure to remember what type of port you have. Incision care     Follow instructions from your health care provider about how to take care of your port insertion site. Make sure you: Wash your hands with soap and water for at least 20 seconds before and after you change your bandage (dressing). If soap and water are not available, use hand sanitizer. Change your dressing as told by your health care provider. Leave stitches (sutures), skin glue, or adhesive strips in place. These skin closures may need to stay in place for 2 weeks or longer. If adhesive strip edges start to loosen and curl up, you may trim the loose edges. Do not remove adhesive strips completely unless your health care provider tells you to do  that. Check your port insertion site every day for signs of infection. Check for: Redness, swelling, or pain. Fluid or blood. Warmth. Pus or a bad smell. Activity Return to your normal activities as told by your health care provider. Ask your health care provider what activities are safe for you. You may have to avoid lifting. Ask your health care provider how much you can safely lift. General instructions Take over-the-counter and prescription medicines only as told by your health care provider. Do not take baths, swim, or use a hot tub until your health care provider approves. Ask your health care provider if you may take showers. You may only be allowed to take sponge baths. If you were given a sedative during the procedure, it can affect you for several hours. Do not drive or operate machinery until your health care provider says that it is safe. Wear a medical alert bracelet in case of an emergency. This will tell any health care providers that you have a port. Keep all follow-up visits. This is important. Contact a health care provider if: You cannot flush your port with saline as directed, or you cannot draw blood from the port. You have a fever or chills. You have redness, swelling, or pain around your port insertion site. You have fluid or blood coming from your port insertion site. Your port insertion site feels warm to the touch. You have pus or a bad smell coming from the port insertion site. Get help right away if: You have chest  pain or shortness of breath. You have bleeding from your port that you cannot control. These symptoms may be an emergency. Get help right away. Call 911. Do not wait to see if the symptoms will go away. Do not drive yourself to the hospital. Summary Take care of the port as told by your health care provider. Keep the manufacturer's information card with you at all times. Change your dressing as told by your health care provider. Contact a health  care provider if you have a fever or chills or if you have redness, swelling, or pain around your port insertion site. Keep all follow-up visits. This information is not intended to replace advice given to you by your health care provider. Make sure you discuss any questions you have with your health care provider. Document Revised: 01/19/2021 Document Reviewed: 01/19/2021 Elsevier Patient Education  2023 [employer] Inc[employer]   Moderate Conscious Sedation, Adult, Care After This sheet gives you information about how to care for yourself after your procedure. Your health care provider may also give you more specific instructions. If you have problems or questions, contact your health care provider. What can I expect after the procedure? After the procedure, it is common to have: Sleepiness for several hours. Impaired judgment for several hours. Difficulty with balance. Vomiting if you eat too soon. Follow these instructions at home: For the time period you were told by your health care provider: Rest. Do not participate in activities where you could fall or become injured. Do not drive or use machinery. Do not drink alcohol. Do not take sleeping pills or medicines that cause drowsiness. Do not make important decisions or sign legal documents. Do not take care of children on your own. Eating and drinking  Follow the diet recommended by your health care provider. Drink enough fluid to keep your urine pale yellow. If you vomit: Drink water, juice, or soup when you can drink without vomiting. Make sure you have little or no nausea before eating solid foods. General instructions Take over-the-counter and prescription medicines only as told by your health care provider. Have a responsible adult stay with you for the time you are told. It is important to have someone help care for you until you are awake and alert. Do not smoke. Keep all follow-up visits as told by your health care provider.  This is important. Contact a health care provider if: You are still sleepy or having trouble with balance after 24 hours. You feel light-headed. You keep feeling nauseous or you keep vomiting. You develop a rash. You have a fever. You have redness or swelling around the IV site. Get help right away if: You have trouble breathing. You have new-onset confusion at home. Summary After the procedure, it is common to feel sleepy, have impaired judgment, or feel nauseous if you eat too soon. Rest after you get home. Know the things you should not do after the procedure. Follow the diet recommended by your health care provider and drink enough fluid to keep your urine pale yellow. Get help right away if you have trouble breathing or new-onset confusion at home. This information is not intended to replace advice given to you by your health care provider. Make sure you discuss any questions you have with your health care provider. Document Revised: 11/15/2019 Document Reviewed: 06/13/2019 Elsevier Patient Education  2023 [employer].

## 2022-12-15 ENCOUNTER — Encounter (HOSPITAL_COMMUNITY)
Admission: RE | Admit: 2022-12-15 | Discharge: 2022-12-15 | Disposition: A | Discharge status: Home / Self Care | Payer: 59 | Source: Ambulatory Visit | Attending: Hematology | Admitting: Hematology

## 2022-12-15 ENCOUNTER — Telehealth: Payer: Self-pay | Admitting: *Deleted

## 2022-12-15 ENCOUNTER — Encounter: Payer: Self-pay | Admitting: Hematology

## 2022-12-15 DIAGNOSIS — C3490 Malignant neoplasm of unspecified part of unspecified bronchus or lung: Secondary | ICD-10-CM | POA: Insufficient documentation

## 2022-12-15 DIAGNOSIS — Z95828 Presence of other vascular implants and grafts: Secondary | ICD-10-CM

## 2022-12-15 DIAGNOSIS — C3491 Malignant neoplasm of unspecified part of right bronchus or lung: Secondary | ICD-10-CM

## 2022-12-15 HISTORY — DX: Presence of other vascular implants and grafts: Z95.828

## 2022-12-15 MED ORDER — PROCHLORPERAZINE MALEATE 10 MG PO TABS
10.0000 mg | ORAL_TABLET | Freq: Four times a day (QID) | ORAL | 4 refills | Status: DC | PRN
Start: 2022-12-15 — End: 2023-02-02

## 2022-12-15 MED ORDER — LIDOCAINE-PRILOCAINE 2.5-2.5 % EX CREA
TOPICAL_CREAM | CUTANEOUS | 3 refills | Status: DC
Start: 2022-12-15 — End: 2023-02-02

## 2022-12-15 MED ORDER — LIDOCAINE-PRILOCAINE 2.5-2.5 % EX CREA
TOPICAL_CREAM | CUTANEOUS | 3 refills | Status: DC
Start: 2022-12-15 — End: 2022-12-15

## 2022-12-15 MED ORDER — PROCHLORPERAZINE MALEATE 10 MG PO TABS
10.0000 mg | ORAL_TABLET | Freq: Four times a day (QID) | ORAL | 4 refills | Status: DC | PRN
Start: 2022-12-15 — End: 2022-12-15

## 2022-12-15 MED ORDER — FLUDEOXYGLUCOSE F - 18 (FDG) INJECTION
6.8000 | Freq: Once | INTRAVENOUS | Status: AC | PRN
  Administered 2022-12-15: 6.8 via INTRAVENOUS

## 2022-12-15 NOTE — Telephone Encounter (Signed)
Received call from wife stating that he is having increased pain in right side of chest that radiates into back.  Per Dr. Ellin Saba advised that he can take percocet 4 times a day rather than 3 times and would discuss further at follow up on Monday.  Verbalized understanding.

## 2022-12-16 ENCOUNTER — Ambulatory Visit (HOSPITAL_COMMUNITY)
Admission: RE | Admit: 2022-12-16 | Discharge: 2022-12-16 | Disposition: A | Discharge status: Home / Self Care | Payer: 59 | Source: Ambulatory Visit | Attending: Hematology | Admitting: Hematology

## 2022-12-16 DIAGNOSIS — C3490 Malignant neoplasm of unspecified part of unspecified bronchus or lung: Secondary | ICD-10-CM | POA: Diagnosis present

## 2022-12-16 DIAGNOSIS — I639 Cerebral infarction, unspecified: Secondary | ICD-10-CM | POA: Diagnosis not present

## 2022-12-16 MED ORDER — GADOBUTROL 1 MMOL/ML IV SOLN
6.0000 mL | Freq: Once | INTRAVENOUS | Status: AC | PRN
  Administered 2022-12-16: 6 mL via INTRAVENOUS

## 2022-12-16 NOTE — Progress Notes (Signed)
Pharmacist Chemotherapy Monitoring - Initial Assessment    Anticipated start date: 12/19/22   The following has been reviewed per standard work regarding the patient's treatment regimen: The patient's diagnosis, treatment plan and drug doses, and organ/hematologic function Lab orders and baseline tests specific to treatment regimen  The treatment plan start date, drug sequencing, and pre-medications Prior authorization status  Patient's documented medication list, including drug-drug interaction screen and prescriptions for anti-emetics and supportive care specific to the treatment regimen The drug concentrations, fluid compatibility, administration routes, and timing of the medications to be used The patient's access for treatment and lifetime cumulative dose history, if applicable  The patient's medication allergies and previous infusion related reactions, if applicable   Changes made to treatment plan:  N/A  Follow up needed:  Pending authorization for treatment for Fulphila still pending.   Stephens Shire, RPH, 12/16/2022  7:43 AM

## 2022-12-18 DIAGNOSIS — R051 Acute cough: Secondary | ICD-10-CM | POA: Diagnosis not present

## 2022-12-18 DIAGNOSIS — J84112 Idiopathic pulmonary fibrosis: Secondary | ICD-10-CM | POA: Diagnosis not present

## 2022-12-18 DIAGNOSIS — J439 Emphysema, unspecified: Secondary | ICD-10-CM | POA: Diagnosis not present

## 2022-12-19 ENCOUNTER — Other Ambulatory Visit: Payer: Self-pay | Admitting: *Deleted

## 2022-12-19 ENCOUNTER — Inpatient Hospital Stay: Payer: 59

## 2022-12-19 ENCOUNTER — Inpatient Hospital Stay (HOSPITAL_BASED_OUTPATIENT_CLINIC_OR_DEPARTMENT_OTHER): Payer: 59 | Admitting: Hematology

## 2022-12-19 VITALS — BP 99/69 | HR 93 | Temp 97.7°F | Resp 19

## 2022-12-19 DIAGNOSIS — Z95828 Presence of other vascular implants and grafts: Secondary | ICD-10-CM

## 2022-12-19 DIAGNOSIS — C3491 Malignant neoplasm of unspecified part of right bronchus or lung: Secondary | ICD-10-CM

## 2022-12-19 DIAGNOSIS — Z5112 Encounter for antineoplastic immunotherapy: Secondary | ICD-10-CM | POA: Diagnosis not present

## 2022-12-19 LAB — COMPREHENSIVE METABOLIC PANEL
ALT: 16 U/L (ref 0–44)
AST: 19 U/L (ref 15–41)
Albumin: 3.2 g/dL — ABNORMAL LOW (ref 3.5–5.0)
Alkaline Phosphatase: 65 U/L (ref 38–126)
Anion gap: 9 (ref 5–15)
BUN: 9 mg/dL (ref 6–20)
CO2: 31 mmol/L (ref 22–32)
Calcium: 9.5 mg/dL (ref 8.9–10.3)
Chloride: 98 mmol/L (ref 98–111)
Creatinine, Ser: 0.68 mg/dL (ref 0.61–1.24)
GFR, Estimated: >60 mL/min (ref >60)
Glucose, Bld: 112 mg/dL — ABNORMAL HIGH (ref 70–99)
Potassium: 3.7 mmol/L (ref 3.5–5.1)
Sodium: 138 mmol/L (ref 135–145)
Total Bilirubin: 0.4 mg/dL (ref 0.3–1.2)
Total Protein: 7.9 g/dL (ref 6.5–8.1)

## 2022-12-19 LAB — CBC WITH DIFFERENTIAL/PLATELET
Abs Immature Granulocytes: 0.06 10*3/uL (ref 0.00–0.07)
Basophils Absolute: 0.1 10*3/uL (ref 0.0–0.1)
Basophils Relative: 1 %
Eosinophils Absolute: 0.1 10*3/uL (ref 0.0–0.5)
Eosinophils Relative: 1 %
HCT: 40.8 % (ref 39.0–52.0)
Hemoglobin: 12.8 g/dL — ABNORMAL LOW (ref 13.0–17.0)
Immature Granulocytes: 1 %
Lymphocytes Relative: 19 %
Lymphs Abs: 2 10*3/uL (ref 0.7–4.0)
MCH: 28.8 pg (ref 26.0–34.0)
MCHC: 31.4 g/dL (ref 30.0–36.0)
MCV: 91.9 fL (ref 80.0–100.0)
Monocytes Absolute: 1.1 10*3/uL — ABNORMAL HIGH (ref 0.1–1.0)
Monocytes Relative: 10 %
Neutro Abs: 7.1 10*3/uL (ref 1.7–7.7)
Neutrophils Relative %: 68 %
Platelets: 347 10*3/uL (ref 150–400)
RBC: 4.44 MIL/uL (ref 4.22–5.81)
RDW: 13.8 % (ref 11.5–15.5)
WBC: 10.4 10*3/uL (ref 4.0–10.5)
nRBC: 0 % (ref 0.0–0.2)

## 2022-12-19 LAB — TSH: TSH: 0.827 u[IU]/mL (ref 0.350–4.500)

## 2022-12-19 LAB — MAGNESIUM: Magnesium: 2.1 mg/dL (ref 1.7–2.4)

## 2022-12-19 MED ORDER — SODIUM CHLORIDE 0.9 % IV SOLN
80.0000 mg/m2 | Freq: Once | INTRAVENOUS | Status: AC
  Administered 2022-12-19: 140 mg via INTRAVENOUS
  Filled 2022-12-19: qty 7

## 2022-12-19 MED ORDER — SODIUM CHLORIDE 0.9 % IV SOLN
541.0000 mg | Freq: Once | INTRAVENOUS | Status: AC
  Administered 2022-12-19: 540 mg via INTRAVENOUS
  Filled 2022-12-19: qty 54

## 2022-12-19 MED ORDER — SODIUM CHLORIDE 0.9% FLUSH
10.0000 mL | Freq: Once | INTRAVENOUS | Status: AC
  Administered 2022-12-19: 10 mL via INTRAVENOUS

## 2022-12-19 MED ORDER — PALONOSETRON HCL INJECTION 0.25 MG/5ML
0.2500 mg | Freq: Once | INTRAVENOUS | Status: AC
  Administered 2022-12-19: 0.25 mg via INTRAVENOUS
  Filled 2022-12-19: qty 5

## 2022-12-19 MED ORDER — OXYCODONE HCL ER 20 MG PO T12A: 20.0000 mg | EXTENDED_RELEASE_TABLET | Freq: Two times a day (BID) | ORAL | 0 refills | Status: DC

## 2022-12-19 MED ORDER — SODIUM CHLORIDE 0.9 % IV SOLN
10.0000 mg | Freq: Once | INTRAVENOUS | Status: AC
  Administered 2022-12-19: 10 mg via INTRAVENOUS
  Filled 2022-12-19: qty 10

## 2022-12-19 MED ORDER — HYDROMORPHONE HCL 1 MG/ML IJ SOLN
2.0000 mg | Freq: Once | INTRAMUSCULAR | Status: AC
  Administered 2022-12-19: 2 mg via INTRAVENOUS
  Filled 2022-12-19: qty 2

## 2022-12-19 MED ORDER — SODIUM CHLORIDE 0.9 % IV SOLN
1500.0000 mg | Freq: Once | INTRAVENOUS | Status: AC
  Administered 2022-12-19: 1500 mg via INTRAVENOUS
  Filled 2022-12-19: qty 30

## 2022-12-19 MED ORDER — SODIUM CHLORIDE 0.9% FLUSH
10.0000 mL | INTRAVENOUS | Status: DC | PRN
  Administered 2022-12-19: 10 mL

## 2022-12-19 MED ORDER — SODIUM CHLORIDE 0.9 % IV SOLN
150.0000 mg | Freq: Once | INTRAVENOUS | Status: AC
  Administered 2022-12-19: 150 mg via INTRAVENOUS
  Filled 2022-12-19: qty 150

## 2022-12-19 MED ORDER — SODIUM CHLORIDE 0.9 % IV SOLN: Freq: Once | INTRAVENOUS | Status: AC

## 2022-12-19 MED ORDER — HEPARIN SOD (PORK) LOCK FLUSH 100 UNIT/ML IV SOLN
500.0000 [IU] | Freq: Once | INTRAVENOUS | Status: AC | PRN
  Administered 2022-12-19: 500 [IU]

## 2022-12-19 MED ORDER — MEGESTROL ACETATE 400 MG/10ML PO SUSP: 400.0000 mg | Freq: Two times a day (BID) | ORAL | 2 refills | Status: DC

## 2022-12-19 NOTE — Progress Notes (Signed)
Patient has been examined by Dr. Katragadda. Vital signs and labs have been reviewed by MD - ANC, Creatinine, LFTs, hemoglobin, and platelets are within treatment parameters per M.D. - pt may proceed with treatment.  Primary RN and pharmacy notified.  

## 2022-12-19 NOTE — Progress Notes (Signed)
Proceed with HR 108 - patient in pain on arrival.  Insurance would not approve Fulphila on day 5.  Orders received to DC Fulphila and day 5 of treatments at this time.  T.O. Dr Carilyn Goodpasture, PharmD

## 2022-12-19 NOTE — Patient Instructions (Addendum)
[facility] at [facility] Discharge Instructions   You were seen and examined today by Dr. Ellin Saba.  He reviewed the results of your lab work which are normal/stable.   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 This cancer is not curable, but treatable. Our goal is to control and shrink the cancer and to alleviate the symptoms you are having related to the cancer.   We will proceed with your treatment today.   Return as scheduled.    Thank you for choosing  Cancer Center at [facility] to provide your oncology and hematology care.  To afford each patient quality time with our provider, please arrive at least 15 minutes before your scheduled appointment time.   If you have a lab appointment with the Cancer Center please come in thru the Main Entrance and check in at the main information desk.  You need to re-schedule your appointment should you arrive 10 or more minutes late.  We strive to give you quality time with our providers, and arriving late affects you and other patients whose appointments are after yours.  Also, if you no show three or more times for appointments you may be dismissed from the clinic at the providers discretion.     Again, thank you for choosing [facility].  Our hope is that these requests will decrease the amount of time that you wait before being seen by our physicians.       _____________________________________________________________  Should you have questions after your visit to [facility], please contact our office at [phone] and follow the prompts.  Our office hours are 8:00 a.m. and 4:30 p.m. Monday - Friday.  Please note that voicemails left after 4:00 p.m. may not be returned until the following business day.  We are closed weekends and major holidays.  You do have access to a nurse 24-7, just  call the main number to the clinic [phone] and do not press any options, hold on the line and a nurse will answer the phone.    For prescription refill requests, have your pharmacy contact our office and allow 72 hours.    Due to Covid, you will need to wear a mask upon entering the hospital. If you do not have a mask, a mask will be given to you at the Main Entrance upon arrival. For doctor visits, patients may have 1 support person age 60 or older with them. For treatment visits, patients can not have anyone with them due to social distancing guidelines and our immunocompromised population.

## 2022-12-19 NOTE — Progress Notes (Signed)
Patient presents today for C1D1 Imfinzi, Carboplatin, and Etoposide infusion. Patient is in satisfactory condition with no new complaints voiced.  Vital signs are stable.  Labs reviewed by Dr. Ellin Saba during the office visit and all labs are within treatment parameters.  We will proceed with treatment per MD orders.   Patient c/o 10/10 back pain on a 0-10 pain scale, Dr.K aware and stated to give Dilaudid 2mg  IV  x 1 dose. Pt was re-assessed in 30 minutes and pt voiced 0/10 on a 0-10 pain scale. Pt stated his back has stopped hurting.   C1D1 Imfinzi, Carboplatin, and Etoposide given today per MD orders. Tolerated infusion without adverse affects. Vital signs stable. No complaints at this time. Discharged from clinic via wheelchair in stable condition. Alert and oriented x 3. F/U with [facility] as scheduled.

## 2022-12-19 NOTE — Progress Notes (Signed)

## 2022-12-19 NOTE — Patient Instructions (Signed)
[facility]  Discharge Instructions: Thank you for choosing [facility] to provide your oncology and hematology care.  If you have a lab appointment with the Cancer Center - please note that after April 8th, 2024, all labs will be drawn in the cancer center.  You do not have to check in or register with the main entrance as you have in the past but will complete your check-in in the cancer center.  Wear comfortable clothing and clothing appropriate for easy access to any Portacath or PICC line.   We strive to give you quality time with your provider. You may need to reschedule your appointment if you arrive late (15 or more minutes).  Arriving late affects you and other patients whose appointments are after yours.  Also, if you miss three or more appointments without notifying the office, you may be dismissed from the clinic at the provider's discretion.      For prescription refill requests, have your pharmacy contact our office and allow 72 hours for refills to be completed.    Today you received the following chemotherapy and/or immunotherapy agents [id number] Imfinzi, Carboplatin, and Etoposide   To help prevent nausea and vomiting after your treatment, we encourage you to take your nausea medication as directed.  Durvalumab Injection What is this medication? DURVALUMAB (dur VAL ue mab) treats some types of cancer. It works by helping your immune system slow or stop the spread of cancer cells. It is a monoclonal antibody. This medicine may be used for other purposes; ask your health care provider or pharmacist if you have questions. COMMON BRAND NAME(S): IMFINZI What should I tell my care team before I take this medication? They need to know if you have any of these conditions: Allogeneic stem cell transplant (uses someone else's stem cells) Autoimmune diseases, such as Crohn disease, ulcerative colitis, lupus History of chest radiation Nervous system  problems, such as Guillain-Barre syndrome, myasthenia gravis Organ transplant An unusual or allergic reaction to durvalumab, other medications, foods, dyes, or preservatives Pregnant or trying to get pregnant Breast-feeding How should I use this medication? This medication is infused into a vein. It is given by your care team in a hospital or clinic setting. A special MedGuide will be given to you before each treatment. Be sure to read this information carefully each time. Talk to your care team about the use of this medication in children. Special care may be needed. Overdosage: If you think you have taken too much of this medicine contact a poison control center or emergency room at once. NOTE: This medicine is only for you. Do not share this medicine with others. What if I miss a dose? Keep appointments for follow-up doses. It is important not to miss your dose. Call your care team if you are unable to keep an appointment. What may interact with this medication? Interactions have not been studied. This list may not describe all possible interactions. Give your health care provider a list of all the medicines, herbs, non-prescription drugs, or dietary supplements you use. Also tell them if you smoke, drink alcohol, or use illegal drugs. Some items may interact with your medicine. What should I watch for while using this medication? Your condition will be monitored carefully while you are receiving this medication. You may need blood work while taking this medication. This medication may cause serious skin reactions. They can happen weeks to months after starting the medication. Contact your care team right away  if you notice fevers or flu-like symptoms with a rash. The rash may be red or purple and then turn into blisters or peeling of the skin. You may also notice a red rash with swelling of the face, lips, or lymph nodes in your neck or under your arms. Tell your care team right away if you  have any change in your eyesight. Talk to your care team if you may be pregnant. Serious birth defects can occur if you take this medication during pregnancy and for 3 months after the last dose. You will need a negative pregnancy test before starting this medication. Contraception is recommended while taking this medication and for 3 months after the last dose. Your care team can help you find the option that works for you. Do not breastfeed while taking this medication and for 3 months after the last dose. What side effects may I notice from receiving this medication? Side effects that you should report to your care team as soon as possible: Allergic reactions--skin rash, itching, hives, swelling of the face, lips, tongue, or throat Dry cough, shortness of breath or trouble breathing Eye pain, redness, irritation, or discharge with blurry or decreased vision Heart muscle inflammation--unusual weakness or fatigue, shortness of breath, chest pain, fast or irregular heartbeat, dizziness, swelling of the ankles, feet, or hands Hormone gland problems--headache, sensitivity to light, unusual weakness or fatigue, dizziness, fast or irregular heartbeat, increased sensitivity to cold or heat, excessive sweating, constipation, hair loss, increased thirst or amount of urine, tremors or shaking, irritability Infusion reactions--chest pain, shortness of breath or trouble breathing, feeling faint or lightheaded Kidney injury (glomerulonephritis)--decrease in the amount of urine, red or dark brown urine, foamy or bubbly urine, swelling of the ankles, hands, or feet Liver injury--right upper belly pain, loss of appetite, nausea, light-colored stool, dark yellow or brown urine, yellowing skin or eyes, unusual weakness or fatigue Pain, tingling, or numbness in the hands or feet, muscle weakness, change in vision, confusion or trouble speaking, loss of balance or coordination, trouble walking, seizures Rash, fever, and  swollen lymph nodes Redness, blistering, peeling, or loosening of the skin, including inside the mouth Sudden or severe stomach pain, bloody diarrhea, fever, nausea, vomiting Side effects that usually do not require medical attention (report these to your care team if they continue or are bothersome): Bone, joint, or muscle pain Diarrhea Fatigue Loss of appetite Nausea Skin rash This list may not describe all possible side effects. Call your doctor for medical advice about side effects. You may report side effects to FDA at 1-800-FDA-1088. Where should I keep my medication? This medication is given in a hospital or clinic. It will not be stored at home. NOTE: This sheet is a summary. It may not cover all possible information. If you have questions about this medicine, talk to your doctor, pharmacist, or health care provider.  2023 Elsevier/Gold Standard (2021-11-19 00:00:00)  Carboplatin Injection What is this medication? CARBOPLATIN (KAR boe pla tin) treats some types of cancer. It works by slowing down the growth of cancer cells. This medicine may be used for other purposes; ask your health care provider or pharmacist if you have questions. COMMON BRAND NAME(S): Paraplatin What should I tell my care team before I take this medication? They need to know if you have any of these conditions: Blood disorders Hearing problems Kidney disease Recent or ongoing radiation therapy An unusual or allergic reaction to carboplatin, cisplatin, other medications, foods, dyes, or preservatives Pregnant or trying to  get pregnant Breast-feeding How should I use this medication? This medication is injected into a vein. It is given by your care team in a hospital or clinic setting. Talk to your care team about the use of this medication in children. Special care may be needed. Overdosage: If you think you have taken too much of this medicine contact a poison control center or emergency room at  once. NOTE: This medicine is only for you. Do not share this medicine with others. What if I miss a dose? Keep appointments for follow-up doses. It is important not to miss your dose. Call your care team if you are unable to keep an appointment. What may interact with this medication? Medications for seizures Some antibiotics, such as amikacin, gentamicin, neomycin, streptomycin, tobramycin Vaccines This list may not describe all possible interactions. Give your health care provider a list of all the medicines, herbs, non-prescription drugs, or dietary supplements you use. Also tell them if you smoke, drink alcohol, or use illegal drugs. Some items may interact with your medicine. What should I watch for while using this medication? Your condition will be monitored carefully while you are receiving this medication. You may need blood work while taking this medication. This medication may make you feel generally unwell. This is not uncommon, as chemotherapy can affect healthy cells as well as cancer cells. Report any side effects. Continue your course of treatment even though you feel ill unless your care team tells you to stop. In some cases, you may be given additional medications to help with side effects. Follow all directions for their use. This medication may increase your risk of getting an infection. Call your care team for advice if you get a fever, chills, sore throat, or other symptoms of a cold or flu. Do not treat yourself. Try to avoid being around people who are sick. Avoid taking medications that contain aspirin, acetaminophen, ibuprofen, naproxen, or ketoprofen unless instructed by your care team. These medications may hide a fever. Be careful brushing or flossing your teeth or using a toothpick because you may get an infection or bleed more easily. If you have any dental work done, tell your dentist you are receiving this medication. Talk to your care team if you wish to become  pregnant or think you might be pregnant. This medication can cause serious birth defects. Talk to your care team about effective forms of contraception. Do not breast-feed while taking this medication. What side effects may I notice from receiving this medication? Side effects that you should report to your care team as soon as possible: Allergic reactions--skin rash, itching, hives, swelling of the face, lips, tongue, or throat Infection--fever, chills, cough, sore throat, wounds that don't heal, pain or trouble when passing urine, general feeling of discomfort or being unwell Low red blood cell level--unusual weakness or fatigue, dizziness, headache, trouble breathing Pain, tingling, or numbness in the hands or feet, muscle weakness, change in vision, confusion or trouble speaking, loss of balance or coordination, trouble walking, seizures Unusual bruising or bleeding Side effects that usually do not require medical attention (report to your care team if they continue or are bothersome): Hair loss Nausea Unusual weakness or fatigue Vomiting This list may not describe all possible side effects. Call your doctor for medical advice about side effects. You may report side effects to FDA at 1-800-FDA-1088. Where should I keep my medication? This medication is given in a hospital or clinic. It will not be stored at home. NOTE:  This sheet is a summary. It may not cover all possible information. If you have questions about this medicine, talk to your doctor, pharmacist, or health care provider.  2023 Elsevier/Gold Standard (2007-09-08 00:00:00)+  Etoposide Injection What is this medication? ETOPOSIDE (e toe POE side) treats some types of cancer. It works by slowing down the growth of cancer cells. This medicine may be used for other purposes; ask your health care provider or pharmacist if you have questions. COMMON BRAND NAME(S): Etopophos, Toposar, VePesid What should I tell my care team before  I take this medication? They need to know if you have any of these conditions: Infection Kidney disease Liver disease Low blood counts, such as low white cell, platelet, red cell counts An unusual or allergic reaction to etoposide, other medications, foods, dyes, or preservatives If you or your partner are pregnant or trying to get pregnant Breastfeeding How should I use this medication? This medication is injected into a vein. It is given by your care team in a hospital or clinic setting. Talk to your care team about the use of this medication in children. Special care may be needed. Overdosage: If you think you have taken too much of this medicine contact a poison control center or emergency room at once. NOTE: This medicine is only for you. Do not share this medicine with others. What if I miss a dose? Keep appointments for follow-up doses. It is important not to miss your dose. Call your care team if you are unable to keep an appointment. What may interact with this medication? Warfarin This list may not describe all possible interactions. Give your health care provider a list of all the medicines, herbs, non-prescription drugs, or dietary supplements you use. Also tell them if you smoke, drink alcohol, or use illegal drugs. Some items may interact with your medicine. What should I watch for while using this medication? Your condition will be monitored carefully while you are receiving this medication. This medication may make you feel generally unwell. This is not uncommon as chemotherapy can affect healthy cells as well as cancer cells. Report any side effects. Continue your course of treatment even though you feel ill unless your care team tells you to stop. This medication can cause serious side effects. To reduce the risk, your care team may give you other medications to take before receiving this one. Be sure to follow the directions from your care team. This medication may increase  your risk of getting an infection. Call your care team for advice if you get a fever, chills, sore throat, or other symptoms of a cold or flu. Do not treat yourself. Try to avoid being around people who are sick. This medication may increase your risk to bruise or bleed. Call your care team if you notice any unusual bleeding. Talk to your care team about your risk of cancer. You may be more at risk for certain types of cancers if you take this medication. Talk to your care team if you may be pregnant. Serious birth defects can occur if you take this medication during pregnancy and for 6 months after the last dose. You will need a negative pregnancy test before starting this medication. Contraception is recommended while taking this medication and for 6 months after the last dose. Your care team can help you find the option that works for you. If your partner can get pregnant, use a condom during sex while taking this medication and for 4 months after the  last dose. Do not breastfeed while taking this medication. This medication may cause infertility. Talk to your care team if you are concerned about your fertility. What side effects may I notice from receiving this medication? Side effects that you should report to your care team as soon as possible: Allergic reactions--skin rash, itching, hives, swelling of the face, lips, tongue, or throat Infection--fever, chills, cough, sore throat, wounds that don't heal, pain or trouble when passing urine, general feeling of discomfort or being unwell Low red blood cell level--unusual weakness or fatigue, dizziness, headache, trouble breathing Unusual bruising or bleeding Side effects that usually do not require medical attention (report to your care team if they continue or are bothersome): Diarrhea Fatigue Hair loss Loss of appetite Nausea Vomiting This list may not describe all possible side effects. Call your doctor for medical advice about side  effects. You may report side effects to FDA at 1-800-FDA-1088. Where should I keep my medication? This medication is given in a hospital or clinic. It will not be stored at home. NOTE: This sheet is a summary. It may not cover all possible information. If you have questions about this medicine, talk to your doctor, pharmacist, or health care provider.  2023 Elsevier/Gold Standard (2007-09-08 00:00:00)     BELOW ARE SYMPTOMS THAT SHOULD BE REPORTED IMMEDIATELY: *FEVER GREATER THAN 100.4 F (38 C) OR HIGHER *CHILLS OR SWEATING *NAUSEA AND VOMITING THAT IS NOT CONTROLLED WITH YOUR NAUSEA MEDICATION *UNUSUAL SHORTNESS OF BREATH *UNUSUAL BRUISING OR BLEEDING *URINARY PROBLEMS (pain or burning when urinating, or frequent urination) *BOWEL PROBLEMS (unusual diarrhea, constipation, pain near the anus) TENDERNESS IN MOUTH AND THROAT WITH OR WITHOUT PRESENCE OF ULCERS (sore throat, sores in mouth, or a toothache) UNUSUAL RASH, SWELLING OR PAIN  UNUSUAL VAGINAL DISCHARGE OR ITCHING   Items with * indicate a potential emergency and should be followed up as soon as possible or go to the Emergency Department if any problems should occur.  Please show the CHEMOTHERAPY ALERT CARD or IMMUNOTHERAPY ALERT CARD at check-in to the Emergency Department and triage nurse.  Should you have questions after your visit or need to cancel or reschedule your appointment, please contact [facility] [phone]  and follow the prompts.  Office hours are 8:00 a.m. to 4:30 p.m. Monday - Friday. Please note that voicemails left after 4:00 p.m. may not be returned until the following business day.  We are closed weekends and major holidays. You have access to a nurse at all times for urgent questions. Please call the main number to the clinic [phone] and follow the prompts.  For any non-urgent questions, you may also contact your provider using MyChart. We now offer e-Visits for anyone 2 and  older to request care online for non-urgent symptoms. For details visit [email or web address].   Also download the MyChart app! Go to the app store, search "MyChart", open the app, select [facility], and log in with your MyChart username and password.

## 2022-12-19 NOTE — Progress Notes (Signed)
[facility] [address] [place] [address]    Clinic Day:  12/19/2022  Referring physician: [name], FNP  Patient Care Team: [name], FNP as PCP - General (Family Medicine) Heidi Dach, RN as Case Manager Merryl Hacker, NP as Nurse Practitioner (Nurse Practitioner) Doreatha Massed, MD as Medical Oncologist (Medical Oncology) Therese Sarah, RN as Oncology Nurse Navigator (Medical Oncology)   ASSESSMENT & PLAN:   Assessment: 1.  Extensive stage small cell lung cancer: - 20 to 30 pound weight loss in the last 4 months due to decreased appetite. - Pain in the chest bilaterally since end of April with hemoptysis. - CT chest (11/22/2022): Multiple right lower lobe lung masses, bulky right hilar, mediastinal, right greater than left supraclavicular lymph nodes.  Fibrotic interstitial lung disease. - Right supraclavicular lymph node biopsy (12/12/2022): Small cell carcinoma. - PET scan (12/15/2022): Right lower lobe bronchogenic carcinoma with lymphadenopathy in the chest, left neck, upper abdomen. - Brain MRI (12/16/2022): Acute infarcts in the bilateral cerebellar hemispheres and left paramedian pons.  Additional focus of diffusion restriction in the medial left frontal cortex, not well-defined.  Question focus of metastatic disease versus acute infarct. - Cycle 1 of carboplatin, etoposide and durvalumab on 12/19/2022   2.  Social/family history: - Lives at home with his wife.  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  Quit smoking 2 years ago.  Smoked 1 and half pack per day for 30 years. - He has been on oxygen at nighttime for 2 years and has been on continuous oxygen at 6 L/min since recent hospitalization in April. - He was adopted and does not know family history.    Plan: 1.  Extensive stage small cell lung cancer: - We have reviewed pathology reports of the right supraclavicular node biopsy. - We have  reviewed scan images of the PET scan and MRI. - We discussed the prognosis of small cell lung cancer. - We discussed chemoimmunotherapy with carboplatin, etoposide and durvalumab.  We discussed side effects in detail. - I reviewed labs: Normal LFTs with low albumin.  Creatinine normal.  CBC grossly normal. - He will proceed with cycle 1 day 1 today.  He will be evaluated in the symptom management clinic next week.  RTC 3 weeks for follow-up.  2.  Bilateral rib pains and back pain: - He is currently taking oxycodone 15 mg every 6 hours as needed which is not helping. - Will start him on OxyContin 20 mg twice daily.  Continue oxycodone 15 mg as needed and hold for drowsiness.  Use stool softeners for constipation.  3.  Decreased appetite: - Will start him on Megace 400 mg twice daily.    Orders Placed This Encounter  Procedures   CBC with Differential    Standing Status:   Future    Standing Expiration Date:   12/19/2023   Comprehensive metabolic panel    Standing Status:   Future    Standing Expiration Date:   12/19/2023   Magnesium    Standing Status:   Future    Standing Expiration Date:   12/19/2023      I,Katie Daubenspeck,acting as a scribe for Doreatha Massed, MD.,have documented all relevant documentation on the behalf of Doreatha Massed, MD,as directed by  Doreatha Massed, MD while in the presence of Doreatha Massed, MD.   I, Doreatha Massed MD, have reviewed the above documentation for accuracy and completeness, and I  agree with the above.   Doreatha Massed, MD   5/20/20245:54 PM  CHIEF COMPLAINT:   Diagnosis: locally advanced lung cancer   Cancer Staging  Small cell lung cancer, right [facility]) Staging form: Lung, AJCC 8th Edition - Clinical stage from 12/07/2022: Stage IVA (cT3, cN3, cM1b) - Unsigned    Prior Therapy: none  Current Therapy:  durvalumab + carboplatin + etoposide   HISTORY OF PRESENT ILLNESS:   Oncology History  Small  cell lung cancer, right (HCC)  12/07/2022 Initial Diagnosis   Small cell lung cancer, right (HCC)   12/19/2022 -  Chemotherapy   Patient is on Treatment Plan : LUNG SMALL CELL EXTENSIVE STAGE Durvalumab + Carboplatin D1 + Etoposide D1-3 q21d x 4 Cycles / Durvalumab q28d        INTERVAL HISTORY:   Hascal is a 62 y.o. male presenting to clinic today for follow up of locally advanced lung cancer. He was last seen by me on 12/08/22 in consultation.  Since his last visit, he underwent biopsy of a right supraclavicular lymph node on 12/12/22. Pathology confirmed small cell carcinoma.  He also had port placed on 12/14/22.  He also underwent staging PET scan on 12/15/22 showing: RLL primary bronchogenic carcinoma with hypermetabolic adenopathy in chest, left neck, and upper abdomen, compatible with T4N3M1c or stage IVB disease; small nodules posterior to right kidney without abnormal hypermetabolism; trace pericardial effusion; small loculated right pleural effusion.  He also underwent staging brain MRI on 12/16/22 showing: within limitation of motion artifact-- acute infarcts in bilateral cerebellar hemispheres and left paramedian pons; additional focus of diffusion restriction in medial left frontal cortex, concerning for focus of metastatic disease but could also represent acute infarct; additional likely subacute infarcts in right posterior frontal lobe, left occipital lobe, and right caudate tail.  Today, he states that he is doing well overall. His appetite level is at 80%. His energy level is at 10%.  PAST MEDICAL HISTORY:   Past Medical History: Past Medical History:  Diagnosis Date   ALS (amyotrophic lateral sclerosis) (HCC)    Asthma    Back pain    COPD (chronic obstructive pulmonary disease) (HCC)    Coronary artery calcification seen on CT scan    DDD (degenerative disc disease), lumbar    Depression    Dysphagia    Following previous surgery   History of blood transfusion 2014    Post-op bleed   History of lung cancer    History of stroke 2015   Idiopathic pulmonary fibrosis (HCC)    Migraine    Port-A-Cath in place 12/15/2022   Spider bite 2012    Surgical History: Past Surgical History:  Procedure Laterality Date   ANTERIOR CERVICAL DECOMP/DISCECTOMY FUSION  12/22/2011   Procedure: ANTERIOR CERVICAL DECOMPRESSION/DISCECTOMY FUSION 3 LEVELS;  Surgeon: Venita Lick, MD;  Location: [facility] OR;  Service: Orthopedics;  Laterality: N/A;  ACDF C5-T1   ANTERIOR CERVICAL DECOMP/DISCECTOMY FUSION  06/25/2012   Procedure: ANTERIOR CERVICAL DECOMPRESSION/DISCECTOMY FUSION 1 LEVEL/HARDWARE REMOVAL;  Surgeon: Kerrin Champagne, MD;  Location: [facility] OR;  Service: Orthopedics;  Laterality: N/A;  Removal anterior cervical plate Z6-X0, Explore Fusion, Left C5-6, C6-7, C7-T1 Re-do Foraminotomy, Left open carpal tunnel release with release of ulnar nerve at [facility], Posterior Cervical Fusion with lateral mass screw   ANTERIOR CERVICAL DECOMP/DISCECTOMY FUSION N/A 03/25/2021   Procedure: ANTERIOR CERVICAL DECOMPRESSION/DISCECTOMY FUSION 2 LEVELS  C3-5;  Surgeon: Venita Lick, MD;  Location: [facility] OR;  Service: Orthopedics;  Laterality: N/A;  ANTERIOR CERVICAL DECOMPRESSION/DISCECTOMY FUSION 2 LEVELS  C3-5   BACK SURGERY     CARDIAC CATHETERIZATION     CARPAL TUNNEL RELEASE  06/25/2012   Procedure: CARPAL TUNNEL RELEASE;  Surgeon: Kerrin Champagne, MD;  Location: [facility] OR;  Service: Orthopedics;  Laterality: Left;  as above   CHOLECYSTECTOMY  2009   CORONARY ANGIOPLASTY  2001   [place]   IR IMAGING GUIDED PORT INSERTION  12/14/2022   LUMBAR FUSION  2000   POSTERIOR CERVICAL FUSION/FORAMINOTOMY  06/25/2012   Procedure: POSTERIOR CERVICAL FUSION/FORAMINOTOMY LEVEL 1;  Surgeon: Kerrin Champagne, MD;  Location: [facility] OR;  Service: Orthopedics;  Laterality: N/A;  as above   POSTERIOR CERVICAL FUSION/FORAMINOTOMY N/A 12/17/2013   Procedure: REMOVAL OF POSTERIOR CERVICAL FUSION LATERAL MASS SCREWS AND RODS  C5-T1, EXPLORE FUSION, LEFT SIX-SEVEN FORAMINOTOMY;  Surgeon: Kerrin Champagne, MD;  Location: [facility] OR;  Service: Orthopedics;  Laterality: N/A;   VASECTOMY  1990    Social History: Social History   Socioeconomic History   Marital status: Married    Spouse name: [name]   Number of children: 2   Years of education: 10th   Highest education level: 12th grade  Occupational History   Occupation: Disability    Employer: [employer]  Tobacco Use   Smoking status: Former    Packs/day: 1.00    Years: 30.00    Additional pack years: 0.00    Total pack years: 30.00    Types: Cigarettes    Quit date: 10/2015    Years since quitting: 7.1    Passive exposure: Current   Smokeless tobacco: Never   Tobacco comments:    Patient denies current smoking.  Patient lives with wife who is a smoker. Updated 11/24/2022. am  Vaping Use   Vaping Use: Never used  Substance and Sexual Activity   Alcohol use: No    Alcohol/week: 0.0 standard drinks of alcohol   Drug use: No   Sexual activity: Yes  Other Topics Concern   Not on file  Social History Narrative   Patient lives at home with his spouse.   Caffeine Use: coffee   Social Determinants of Health   Financial Resource Strain: Low Risk  (11/18/2022)   Overall Financial Resource Strain (CARDIA)    Difficulty of Paying Living Expenses: Not very hard  Food Insecurity: No Food Insecurity (11/18/2022)   Hunger Vital Sign    Worried About Running Out of Food in the Last Year: Never true    Ran Out of Food in the Last Year: Never true  Transportation Needs: No Transportation Needs (11/18/2022)   PRAPARE - Administrator, Civil Service (Medical): No    Lack of Transportation (Non-Medical): No  Physical Activity: Unknown (11/18/2022)   Exercise Vital Sign    Days of Exercise per Week: Patient declined    Minutes of Exercise per Session: Not on file  Stress: Stress Concern Present (11/18/2022)   [employer] of Occupational Health -  Occupational Stress Questionnaire    Feeling of Stress : Very much  Social Connections: Unknown (11/18/2022)   Social Connection and Isolation Panel [NHANES]    Frequency of Communication with Friends and Family: More than three times a week    Frequency of Social Gatherings with Friends and Family: Three times a week    Attends Religious Services: Patient declined    Active Member of Clubs or Organizations: No    Attends Banker Meetings: Not on file  Marital Status: Married  Catering manager Violence: Not At Risk (11/09/2022)   Humiliation, Afraid, Rape, and Kick questionnaire    Fear of Current or Ex-Partner: No    Emotionally Abused: No    Physically Abused: No    Sexually Abused: No    Family History: Family History  Problem Relation Age of Onset   ALS Mother    Cancer Mother    Cancer Sister    ALS Sister    Cancer Brother        1 brother   Anesthesia problems Neg Hx     Current Medications:  Current Outpatient Medications:    albuterol (PROVENTIL) (2.5 MG/3ML) 0.083% nebulizer solution, Take 3 mLs (2.5 mg total) by nebulization every 6 (six) hours as needed for wheezing or shortness of breath., Disp: 150 mL, Rfl: 1   albuterol (VENTOLIN HFA) 108 (90 Base) MCG/ACT inhaler, Inhale 2 puffs into the lungs every 6 (six) hours as needed for wheezing or shortness of breath., Disp: 1 each, Rfl: 5   benzonatate (TESSALON) 200 MG capsule, Take 1 capsule (200 mg total) by mouth 3 (three) times daily as needed., Disp: 90 capsule, Rfl: 2   blood glucose meter kit and supplies, Dispense based on patient and insurance preference. Use up to four times daily as directed. (FOR ICD-10 E10.9, E11.9)., Disp: 1 each, Rfl: 0   busPIRone (BUSPAR) 5 MG tablet, Take 1 tablet (5 mg total) by mouth 3 (three) times daily., Disp: 90 tablet, Rfl: 1   CARBOPLATIN IV, Inject into the vein every 21 ( twenty-one) days., Disp: , Rfl:    DULoxetine (CYMBALTA) 60 MG capsule, Take 1 capsule (60  mg total) by mouth daily., Disp: 90 capsule, Rfl: 1   DURVALUMAB IV, Inject into the vein every 21 ( twenty-one) days., Disp: , Rfl:    Ensure Max Protein (ENSURE MAX PROTEIN) LIQD, Take 330 mLs by mouth 3 (three) times daily., Disp: 29700 mL, Rfl: 2   ETOPOSIDE IV, Inject into the vein every 21 ( twenty-one) days., Disp: , Rfl:    fexofenadine (ALLEGRA) 180 MG tablet, Take 1 tablet (180 mg total) by mouth daily., Disp: 90 tablet, Rfl: 1   ibuprofen (ADVIL) 800 MG tablet, Take 1 tablet (800 mg total) by mouth every 8 (eight) hours as needed., Disp: 30 tablet, Rfl: 0   lidocaine-prilocaine (EMLA) cream, Apply a small amount to port a cath site and cover with plastic wrap 1 hour prior to infusion appointments, Disp: 30 g, Rfl: 3   LORazepam (ATIVAN) 0.5 MG tablet, Take 1 tablet (0.5 mg total) by mouth once as needed for up to 1 dose for anxiety. Take 1hr prior to scans, Disp: 2 tablet, Rfl: 0   magic mouthwash w/lidocaine SOLN, Take 10 mLs by mouth 3 (three) times daily as needed for mouth pain., Disp: 250 mL, Rfl: 1   megestrol (MEGACE) 400 MG/10ML suspension, Take 10 mLs (400 mg total) by mouth 2 (two) times daily., Disp: 480 mL, Rfl: 2   methocarbamol (ROBAXIN) 500 MG tablet, Take 1 tablet (500 mg total) by mouth 2 (two) times daily as needed for muscle spasms., Disp: 20 tablet, Rfl: 0   metoprolol tartrate (LOPRESSOR) 50 MG tablet, Take 0.5 tablets (25 mg total) by mouth 2 (two) times daily., Disp: 60 tablet, Rfl: 1   Misc. Devices MISC, Patient require 6L Nasal Cannula Oxygen, Disp: 99 each, Rfl: 99   naloxone (NARCAN) nasal spray 4 mg/0.1 mL, Place 1 spray into the nose  as needed (opioid overdose)., Disp: , Rfl:    Nintedanib (OFEV) 150 MG CAPS, Take 1 capsule (150 mg total) by mouth 2 (two) times daily., Disp: 180 capsule, Rfl: 1   nortriptyline (PAMELOR) 50 MG capsule, Take 50 mg by mouth 2 (two) times daily., Disp: , Rfl:    oxyCODONE (ROXICODONE) 15 MG immediate release tablet, Take 15 mg by  mouth 4 (four) times daily., Disp: , Rfl: 0   prochlorperazine (COMPAZINE) 10 MG tablet, Take 1 tablet (10 mg total) by mouth every 6 (six) hours as needed for nausea or vomiting., Disp: 60 tablet, Rfl: 4   sildenafil (REVATIO) 20 MG tablet, Take by mouth., Disp: , Rfl:    topiramate (TOPAMAX) 50 MG tablet, Take by mouth., Disp: , Rfl:    traZODone (DESYREL) 50 MG tablet, Take 2 tablets (100 mg total) by mouth at bedtime., Disp: , Rfl:    oxyCODONE (OXYCONTIN) 20 mg 12 hr tablet, Take 1 tablet (20 mg total) by mouth every 12 (twelve) hours., Disp: 60 tablet, Rfl: 0 No current facility-administered medications for this visit.  Facility-Administered Medications Ordered in Other Visits:    sodium chloride flush (NS) 0.9 % injection 10 mL, 10 mL, Intracatheter, PRN, Doreatha Massed, MD, 10 mL at 12/19/22 1346   Allergies: Allergies  Allergen Reactions   Pirfenidone Nausea And Vomiting and Other (See Comments)    Weight loss    REVIEW OF SYSTEMS:   Review of Systems  Constitutional:  Negative for chills, fatigue and fever.  HENT:   Positive for trouble swallowing. Negative for lump/mass, mouth sores, nosebleeds and sore throat.   Eyes:  Negative for eye problems.  Respiratory:  Positive for shortness of breath. Negative for cough.   Cardiovascular:  Positive for chest pain. Negative for leg swelling and palpitations.  Gastrointestinal:  Negative for abdominal pain, constipation, diarrhea, nausea and vomiting.  Genitourinary:  Negative for bladder incontinence, difficulty urinating, dysuria, frequency, hematuria and nocturia.   Musculoskeletal:  Negative for arthralgias, back pain, flank pain, myalgias and neck pain.  Skin:  Negative for itching and rash.  Neurological:  Negative for dizziness, headaches and numbness.  Hematological:  Does not bruise/bleed easily.  Psychiatric/Behavioral:  Positive for depression and sleep disturbance. Negative for suicidal ideas. The patient is  nervous/anxious.   All other systems reviewed and are negative.    VITALS:   There were no vitals taken for this visit.  Wt Readings from Last 3 Encounters:  12/19/22 129 lb 12.8 oz (58.9 kg)  12/14/22 132 lb 0.9 oz (59.9 kg)  12/08/22 132 lb (59.9 kg)    There is no height or weight on file to calculate BMI.  Performance status (ECOG): 1 - Symptomatic but completely ambulatory  PHYSICAL EXAM:   Physical Exam Vitals and nursing note reviewed. Exam conducted with a chaperone present.  Constitutional:      Appearance: Normal appearance.  Cardiovascular:     Rate and Rhythm: Normal rate and regular rhythm.     Pulses: Normal pulses.     Heart sounds: Normal heart sounds.  Pulmonary:     Effort: Pulmonary effort is normal.     Breath sounds: Normal breath sounds.  Abdominal:     Palpations: Abdomen is soft. There is no hepatomegaly, splenomegaly or mass.     Tenderness: There is no abdominal tenderness.  Musculoskeletal:     Right lower leg: No edema.     Left lower leg: No edema.  Lymphadenopathy:  Cervical: No cervical adenopathy.     Right cervical: No superficial, deep or posterior cervical adenopathy.    Left cervical: No superficial, deep or posterior cervical adenopathy.     Upper Body:     Right upper body: No supraclavicular or axillary adenopathy.     Left upper body: No supraclavicular or axillary adenopathy.  Neurological:     General: No focal deficit present.     Mental Status: He is alert and oriented to person, place, and time.  Psychiatric:        Mood and Affect: Mood normal.        Behavior: Behavior normal.     LABS:      Latest Ref Rng & Units 12/19/2022    7:56 AM 11/18/2022   12:26 PM 11/10/2022    4:41 AM  CBC  WBC 4.0 - 10.5 K/uL 10.4  15.3  24.8   Hemoglobin 13.0 - 17.0 g/dL 16.1  09.6  04.5   Hematocrit 39.0 - 52.0 % 40.8  39.2  36.9   Platelets 150 - 400 K/uL 347  514  411       Latest Ref Rng & Units 12/19/2022    7:56 AM  11/18/2022   12:26 PM 11/09/2022    5:14 AM  CMP  Glucose 70 - 99 mg/dL 409  811  914   BUN 6 - 20 mg/dL 9  11  11    Creatinine 0.61 - 1.24 mg/dL 7.82  9.56  2.13   Sodium 135 - 145 mmol/L 138  146  138   Potassium 3.5 - 5.1 mmol/L 3.7  4.1  3.5   Chloride 98 - 111 mmol/L 98  103  105   CO2 22 - 32 mmol/L 31  30  27    Calcium 8.9 - 10.3 mg/dL 9.5  9.1  8.5   Total Protein 6.5 - 8.1 g/dL 7.9  6.5  6.5   Total Bilirubin 0.3 - 1.2 mg/dL 0.4  0.4  0.6   Alkaline Phos 38 - 126 U/L 65  99  100   AST 15 - 41 U/L 19  30  17    ALT 0 - 44 U/L 16  81  27      No results found for: "CEA1", "CEA" / No results found for: "CEA1", "CEA" Lab Results  Component Value Date   PSA1 2.9 09/01/2022   No results found for: "YQM578" No results found for: "CAN125"  No results found for: "TOTALPROTELP", "ALBUMINELP", "A1GS", "A2GS", "BETS", "BETA2SER", "GAMS", "MSPIKE", "SPEI" Lab Results  Component Value Date   TIBC 248 (L) 02/10/2022   TIBC 232 (L) 12/11/2017   TIBC 275 03/17/2017   FERRITIN 207 02/10/2022   FERRITIN 174 12/11/2017   FERRITIN 125 03/17/2017   IRONPCTSAT 17 02/10/2022   IRONPCTSAT 20 12/11/2017   IRONPCTSAT 15 03/17/2017   No results found for: "LDH"   STUDIES:   MR Brain W Wo Contrast  Result Date: 12/16/2022 CLINICAL DATA:  Non-small cell lung cancer, staging a EXAM: MRI HEAD WITHOUT AND WITH CONTRAST TECHNIQUE: Multiplanar, multiecho pulse sequences of the brain and surrounding structures were obtained without and with intravenous contrast. CONTRAST:  6mL GADAVIST GADOBUTROL 1 MMOL/ML IV SOLN COMPARISON:  04/13/2017 FINDINGS: Evaluation is somewhat limited by motion artifact. Brain: Linear area of restricted diffusion in the posterior right cerebellum, which measures up to 18 x 6 x 8 mm (AP x TR x CC). Additional smaller areas of restricted diffusion with ADC correlates are noted  in the bilateral cerebellar hemispheres (series 5, images 64 and 66) and left paramedian pons  (series 5, image 64), which are T2 hyperintense and nonenhancing, concerning for acute infarcts. Additional punctate foci of increased diffusivity without definite ADC correlate in the right posterior frontal white matter (series 5, image 88), the left occipital lobe (series 5, image 80), and right caudate tail (series 5, image 81), concerning for subacute infarcts. Diffusion restricting focus with ADC correlate in the anteromedial right frontal cortex (series 5, image 81) correlates with possible contrast enhancement (series 19, image 26 and series 18, image 32), which could represent metastatic disease. No significant associated T2 hyperintense signal. No other abnormal parenchymal enhancement. No acute hemorrhage, mass, mass effect, or midline shift. No hydrocephalus or extra-axial collection. Normal pituitary and craniocervical junction. No hemosiderin deposition to suggest remote hemorrhage. Normal cerebral volume for age. Scattered T2 hyperintense signal in the periventricular white matter, likely the sequela of mild-to-moderate chronic small vessel ischemic disease. Vascular: Normal arterial flow voids. Normal arterial and venous enhancement. Skull and upper cervical spine: Normal marrow signal. Sinuses/Orbits: Air-fluid level in the right maxillary sinus. Mucosal thickening in the left maxillary sinus. No acute finding in the orbits. Other: The mastoid air cells are well aerated. IMPRESSION: 1. Evaluation is somewhat limited by motion artifact. 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 2. Additional focus of diffusion restriction in the medial left frontal cortex which correlates with possible contrast enhancement, although this is not well defined, in part due to motion. This is concerning for a focus of metastatic disease but could also represent an acute infarct. Attention on follow-up. 3. Additional likely subacute infarcts in the  right posterior frontal lobe, left occipital lobe, and right caudate tail. 4. Air-fluid level in the right maxillary sinus, which is nonspecific but can be seen in the setting of acute sinusitis. Electronically Signed   By: Wiliam Ke M.D.   On: 12/16/2022 14:40   NM PET Image Initial (PI) Skull Base To Thigh  Result Date: 12/16/2022 CLINICAL DATA:  Initial treatment strategy for non-small cell lung cancer. EXAM: NUCLEAR MEDICINE PET SKULL BASE TO THIGH TECHNIQUE: 6.8 mCi F-18 FDG was injected intravenously. Full-ring PET imaging was performed from the skull base to thigh after the radiotracer. CT data was obtained and used for attenuation correction and anatomic localization. Fasting blood glucose: 111 mg/dl COMPARISON:  CT chest 16/05/9603. FINDINGS: Mediastinal blood pool activity: SUV max 1.7 Liver activity: SUV max NA NECK: Hypermetabolic posterior left level 2 lymph node measures 8 mm (3/33), SUV max 2.1. No additional abnormal hypermetabolism. Incidental CT findings: None. CHEST: Extensive hypermetabolic adenopathy in the low internal jugular, supraclavicular, mediastinal and right hilar stations. Index low right paratracheal nodal mass measures approximately 3.7 cm (3/93), SUV max 6.5. Masslike consolidation in the right lower lobe is somewhat difficult to measure but collectively, measures approximately 8.9 x 9.1 cm (7/74), with SUV max measured superiorly, 8.2. Incidental CT findings: Left IJ Port-A-Cath terminates in the right atrium. Atherosclerotic calcification of the aorta and coronary arteries. Heart is at the upper limits of normal in size. Trace pericardial effusion. Small loculated right pleural effusion. Paraseptal emphysema. Suspect basilar honeycombing. ABDOMEN/PELVIS: Vague activity in the periportal region without a definite CT correlate. High left periaortic lymph nodes measure up to approximately 7 mm (3/155), SUV max 2.7. Small nodules posterior to the right kidney measure up to 10  mm (3/186) do not show abnormal hypermetabolism. No  additional abnormal hypermetabolism. Incidental CT findings: Liver, adrenal glands, kidneys, spleen, pancreas, stomach and bowel are grossly unremarkable. Cholecystectomy. Prostate is slightly enlarged. SKELETON: No abnormal hypermetabolism. Incidental CT findings: Degenerative changes in the spine. Postoperative changes in the lumbar spine. IMPRESSION: 1. 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 2. Small nodules posterior to the right kidney do not show abnormal hypermetabolism. Recommend attention on follow-up. 3. Trace pericardial effusion. 4. Small loculated right pleural effusion. 5. 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 6. Mildly enlarged prostate. 7. Aortic atherosclerosis (ICD10-I70.0). Coronary artery calcification. Electronically Signed   By: Leanna Battles M.D.   On: 12/16/2022 14:39   IR IMAGING GUIDED PORT INSERTION  Result Date: 12/14/2022 INDICATION: SCLC EXAM: IMPLANTED PORT A CATH PLACEMENT WITH ULTRASOUND AND FLUOROSCOPIC GUIDANCE MEDICATIONS: None ANESTHESIA/SEDATION: Moderate (conscious) sedation was employed during this procedure. A total of Versed 2 mg and Fentanyl 100 mcg was administered intravenously. Moderate Sedation Time: 33 minutes. The patient's level of consciousness and vital signs were monitored continuously by radiology nursing throughout the procedure under my direct supervision. FLUOROSCOPY TIME:  Fluoroscopic dose; 1 mGy COMPLICATIONS: None immediate. PROCEDURE: The procedure, risks, benefits, and alternatives were explained to the patient. Questions regarding the procedure were encouraged and answered. The patient understands and consents to the procedure. The LEFT neck and chest were prepped with chlorhexidine in a sterile fashion, and a sterile drape  was applied covering the operative field. Maximum barrier sterile technique with sterile gowns and gloves were used for the procedure. A timeout was performed prior to the initiation of the procedure. Local anesthesia was provided with 1% lidocaine with epinephrine. After creating a small venotomy incision, a micropuncture kit was utilized to access the internal jugular vein under direct, real-time ultrasound guidance. Ultrasound image documentation was performed. The microwire was kinked to measure appropriate catheter length. A subcutaneous port pocket was then created along the upper chest wall utilizing a combination of sharp and blunt dissection. The pocket was irrigated with sterile saline. A single lumen ISP power injectable port was chosen for placement. The 8 Fr catheter was tunneled from the port pocket site to the venotomy incision. The port was placed in the pocket. The external catheter was trimmed to appropriate length. At the venotomy, an 8 Fr peel-away sheath was placed over a guidewire under fluoroscopic guidance. The catheter was then placed through the sheath and the sheath was removed. Final catheter positioning was confirmed and documented with a fluoroscopic spot radiograph. The port was accessed with a Huber needle, aspirated and flushed with heparinized saline. The port pocket incision was closed with interrupted 3-0 Vicryl suture then Dermabond was applied, including at the venotomy incision. Dressings were placed. The patient tolerated the procedure well without immediate post procedural complication. IMPRESSION: Successful placement of a LEFT internal jugular approach power injectable Port-A-Cath. The tip of the catheter is positioned within the proximal RIGHT atrium. The catheter is ready for immediate use. Roanna Banning, MD [facility] Electronically Signed   By: Roanna Banning M.D.   On: 12/14/2022 11:06   [place] CORE BIOPSY (LYMPH  NODES)  Result Date: 12/12/2022 INDICATION: R Lung Mass with R Supraclavicular Lymph Node EXAM: ULTRASOUND-GUIDED RIGHT SUPRACLAVICULAR LYMPH NODE BIOPSY COMPARISON:  CT chest, 11/24/2022. MEDICATIONS: 5 mL lidocaine 1% ANESTHESIA/SEDATION: Local anesthetic was administered. COMPLICATIONS: None immediate. TECHNIQUE: Informed written consent was obtained from  the patient and/or patient's representative after a discussion of the risks, benefits and alternatives to treatment. Questions regarding the procedure were encouraged and answered. Initial ultrasound scanning demonstrated enlarged RIGHT supraclavicular lymph mass. An ultrasound image was saved for documentation purposes. The procedure was planned. A timeout was performed prior to the initiation of the procedure. The operative was prepped and draped in the usual sterile fashion, and a sterile drape was applied covering the operative field. A timeout was performed prior to the initiation of the procedure. Local anesthesia was provided with 1% lidocaine. Under direct ultrasound guidance, an 18 gauge core needle device was utilized to obtain to obtain 4 core needle biopsies of the RIGHT supraclavicular lymph node. The samples were placed in saline and submitted to pathology. The needle was removed and superficial hemostasis was achieved with manual compression. Post procedure scan was negative for significant hematoma. A dressing was applied. The patient tolerated the procedure well without immediate postprocedural complication. IMPRESSION: Successful ultrasound guided biopsy of the enlarged RIGHT supraclavicular lymph node. Roanna Banning, MD [facility] Electronically Signed   By: Roanna Banning M.D.   On: 12/12/2022 14:46   CT Chest W Contrast  Result Date: 11/29/2022 CLINICAL DATA:  Lung mass; * Tracking Code: BO * EXAM: CT CHEST WITH CONTRAST TECHNIQUE: Multidetector CT imaging of the chest was  performed during intravenous contrast administration. RADIATION DOSE REDUCTION: This exam was performed according to the departmental dose-optimization program which includes automated exposure control, adjustment of the mA and/or kV according to patient size and/or use of iterative reconstruction technique. CONTRAST:  60mL OMNIPAQUE IOHEXOL 300 MG/ML  SOLN COMPARISON:  Chest CT dated November 08, 2022 FINDINGS: Cardiovascular: Normal heart size. Trace pericardial effusion. Normal caliber thoracic aorta with mild atherosclerotic disease. Mild coronary artery calcifications. Mediastinum/Nodes: Bulky right hilar and mediastinal lymph nodes. Enlarged right-greater-than-left supraclavicular lymph nodes. Some of the nodes demonstrate central low density which is likely due to necrosis. Reference right lower paratracheal lymph node measuring 0.9 cm in short axis on series 3, image 65 reference right supraclavicular lymph node measuring 1.8 cm in short axis series 3, image 20. Lungs/Pleura: Moderate narrowing of the right middle lobe and right lower lobe bronchi occlusion of several of the segmental left lower lobe bronchi. Multiple right lower lobe pulmonary masses causing moderate narrowing of the right lower lobe pulmonary artery. Largest measures 6.0 x 4.2 cm on series 5, image 104, unchanged when compared with the prior exam. Solid pulmonary nodule located at the junction of the major mitral or fissure measuring 8 mm on series 5, image 82, previously 4 mm on April 27th 2021 prior. Additional bilateral scattered solid pulmonary nodules are stable when compared with prior CT dated November 26, 2019. Reference left upper lobe nodule measuring 8 mm on series 5, image 82, unchanged. Small right pleural effusion. Severe upper lobe predominant paraseptal emphysema. Unchanged lower lung predominant fibrosis with honeycomb change. Decreased patchy airspace opacities with mild residual ground-glass opacities seen in the upper lungs.  Upper Abdomen: No acute abnormality. Musculoskeletal: No chest wall abnormality. No acute or significant osseous findings. IMPRESSION: 1. Multiple right lower lobe pulmonary masses, unchanged when compared with the most recent prior and concerning for primary lung malignancy. Recommend tissue sampling. 2. Bulky right hilar, mediastinal, and right-greater-than-left supraclavicular lymph nodes, concerning for nodal metastatic disease. 3. A perifissural solid nodule of the right lung is increased in size compared with 2021 prior, likely an intrapulmonary lymph node, concerning for  additional site of nodal disease. 4. Additional bilateral scattered solid pulmonary nodules are stable when compared with prior CT dated November 26, 2019 and likely benign. 5. Small right pleural effusion and trace pericardial effusion. 6. Decreased patchy airspace opacities with mild residual ground-glass opacities seen in the upper lungs, consistent with resolving infectious or inflammatory process. 7. Unchanged fibrotic interstitial lung disease, consistent with UIP pattern. 8. Aortic Atherosclerosis (ICD10-I70.0) and Emphysema (ICD10-J43.9). These results will be called to the ordering clinician or representative by the Radiologist Assistant, and communication documented in the PACS or [employer]. Electronically Signed   By: Allegra Lai M.D.   On: 11/29/2022 08:55

## 2022-12-20 ENCOUNTER — Other Ambulatory Visit: Payer: Self-pay | Admitting: *Deleted

## 2022-12-20 ENCOUNTER — Inpatient Hospital Stay: Payer: 59

## 2022-12-20 VITALS — BP 94/67 | HR 79 | Temp 98.0°F | Resp 18

## 2022-12-20 DIAGNOSIS — J9383 Other pneumothorax: Secondary | ICD-10-CM | POA: Diagnosis not present

## 2022-12-20 DIAGNOSIS — C3491 Malignant neoplasm of unspecified part of right bronchus or lung: Secondary | ICD-10-CM

## 2022-12-20 DIAGNOSIS — Z95828 Presence of other vascular implants and grafts: Secondary | ICD-10-CM

## 2022-12-20 DIAGNOSIS — J939 Pneumothorax, unspecified: Secondary | ICD-10-CM | POA: Diagnosis not present

## 2022-12-20 DIAGNOSIS — J9312 Secondary spontaneous pneumothorax: Secondary | ICD-10-CM | POA: Diagnosis not present

## 2022-12-20 LAB — T4: T4, Total: 8.2 ug/dL (ref 4.5–12.0)

## 2022-12-20 MED ORDER — SODIUM CHLORIDE 0.9 % IV SOLN: Freq: Once | INTRAVENOUS | Status: AC

## 2022-12-20 MED ORDER — SODIUM CHLORIDE 0.9% FLUSH
10.0000 mL | INTRAVENOUS | Status: DC | PRN
  Administered 2022-12-20 (×2): 10 mL

## 2022-12-20 MED ORDER — FAMOTIDINE 20 MG PO TABS: 20.0000 mg | ORAL_TABLET | Freq: Two times a day (BID) | ORAL | 3 refills | Status: DC

## 2022-12-20 MED ORDER — SODIUM CHLORIDE 0.9 % IV SOLN
80.0000 mg/m2 | Freq: Once | INTRAVENOUS | Status: AC
  Administered 2022-12-20: 140 mg via INTRAVENOUS
  Filled 2022-12-20: qty 7

## 2022-12-20 MED ORDER — SODIUM CHLORIDE 0.9 % IV SOLN
10.0000 mg | Freq: Once | INTRAVENOUS | Status: AC
  Administered 2022-12-20: 10 mg via INTRAVENOUS
  Filled 2022-12-20: qty 10

## 2022-12-20 MED ORDER — HEPARIN SOD (PORK) LOCK FLUSH 100 UNIT/ML IV SOLN
500.0000 [IU] | Freq: Once | INTRAVENOUS | Status: AC | PRN
  Administered 2022-12-20: 500 [IU]

## 2022-12-20 NOTE — Progress Notes (Signed)
Patient presents today for C1D2 VP16. Heart rate 106 on arrival. Message sent to Dr. Ellin Saba / A. Anderson. Message received okay to proceed with treatment.   VP 16 given today per MD orders. Tolerated infusion without adverse affects. Vital signs stable. No complaints at this time. Discharged from clinic ambulatory in stable condition. Alert and oriented x 3. F/U with [facility] as scheduled.

## 2022-12-20 NOTE — Telephone Encounter (Signed)
Patient's wife called to inform that he had chest tightness during treatment today, however the tightness has subsided and he feels like he has indigestion.  Discussed with Dr, Ellin Saba and he advised that patient take Pepcid 20 mg tablets bid. Wife made aware and verbalized understanding.

## 2022-12-20 NOTE — Progress Notes (Signed)
Confirmed with Dr Ellin Saba   Etoposide dose for days 2 and 3 should be in at 80 mg/m2 this cycle.  Orders updated  T.O. Dr Carilyn Goodpasture, PharmD

## 2022-12-20 NOTE — Patient Instructions (Signed)
[facility]  Discharge Instructions: Thank you for choosing [facility] to provide your oncology and hematology care.  If you have a lab appointment with the Cancer Center - please note that after April 8th, 2024, all labs will be drawn in the cancer center.  You do not have to check in or register with the main entrance as you have in the past but will complete your check-in in the cancer center.  Wear comfortable clothing and clothing appropriate for easy access to any Portacath or PICC line.   We strive to give you quality time with your provider. You may need to reschedule your appointment if you arrive late (15 or more minutes).  Arriving late affects you and other patients whose appointments are after yours.  Also, if you miss three or more appointments without notifying the office, you may be dismissed from the clinic at the provider's discretion.      For prescription refill requests, have your pharmacy contact our office and allow 72 hours for refills to be completed.    Today you received the following chemotherapy and/or immunotherapy agents Etoposide. Etoposide Capsules What is this medication? ETOPOSIDE (e toe POE side) treats lung cancer. It works by slowing down the growth of cancer cells. This medicine may be used for other purposes; ask your health care provider or pharmacist if you have questions. COMMON BRAND NAME(S): VePesid What should I tell my care team before I take this medication? They need to know if you have any of these conditions: Infection Kidney disease Liver disease Low blood counts, such as low white cell, platelet, red cell counts An unusual or allergic reaction to etoposide, other medications, foods, dyes, or preservatives Pregnant or trying to get pregnant Breastfeeding How should I use this medication? Take this medication by mouth with a glass of water. Take it as directed on the prescription label. Do not cut,  crush, or chew this medication. Swallow the capsules whole. Do not take it more often than directed. Keep taking it unless your care team tells you to stop. Handling this medication may be harmful. Wear gloves while touching this medication or bottle. Talk to your care team about how to handle this medication. Special instructions may apply. Talk to your care team about the use of this medication in children. Special care may be needed. Overdosage: If you think you have taken too much of this medicine contact a poison control center or emergency room at once. NOTE: This medicine is only for you. Do not share this medicine with others. What if I miss a dose? If you miss a dose, take it as soon as you can. If it is almost time for your next dose, take only that dose. Do not take double or extra doses. What may interact with this medication? Cyclosporine Warfarin This list may not describe all possible interactions. Give your health care provider a list of all the medicines, herbs, non-prescription drugs, or dietary supplements you use. Also tell them if you smoke, drink alcohol, or use illegal drugs. Some items may interact with your medicine. What should I watch for while using this medication? Your condition will be monitored carefully while you are receiving this medication. This medication may make you feel generally unwell. This is not uncommon as chemotherapy can affect healthy cells as well as cancer cells. Report any side effects. Continue your course of treatment even though you feel ill unless your care team tells you to   stop. This medication may increase your risk of getting an infection. Call your care team for advice if you get a fever, chills, sore throat, or other symptoms of a cold or flu. Do not treat yourself. Try to avoid being around people who are sick. This medication may increase your risk to bruise or bleed. Call your care team if you notice any unusual bleeding. Talk to your  care team about your risk of cancer. You may be more at risk for certain types of cancers if you take this medication. Talk to your care team if you may be pregnant. Serious birth defects can occur if you take this medication during pregnancy and for 6 months after the last dose. You will need a negative pregnancy test before starting this medication. Contraception is recommended while taking this medication and for 6 months after the last dose. Your care team can help you find the option that works for you. If your partner can get pregnant, use a condom during sex while taking this medication and for 4 months after the last dose. Do not breastfeed while taking this medication. This medication may cause infertility. Talk to your care team if you are concerned about your fertility. What side effects may I notice from receiving this medication? Side effects that you should report to your care team as soon as possible: Allergic reactions--skin rash, itching, hives, swelling of the face, lips, tongue, or throat Infection--fever, chills, cough, sore throat, wounds that don't heal, pain or trouble when passing urine, general feeling of discomfort or being unwell Low red blood cell level--unusual weakness or fatigue, dizziness, headache, trouble breathing Unusual bruising or bleeding Side effects that usually do not require medical attention (report to your care team if they continue or are bothersome): Diarrhea Fatigue Hair loss Loss of appetite Nausea Pain, redness, or swelling with sores inside the mouth or throat Vomiting This list may not describe all possible side effects. Call your doctor for medical advice about side effects. You may report side effects to FDA at 1-800-FDA-1088. Where should I keep my medication? Keep out of the reach of children and pets. Store in a refrigerator between 2 and 8 degrees C (36 and 46 degrees F). Do not freeze. Get rid any unused medication after the expiration  date. To get rid of medications that are no longer needed or have expired: Take the medication to a medication take-back program. Check with your pharmacy or law enforcement to find a location. If you cannot return the medication, ask your pharmacist or care team how to get rid of this medication safely. NOTE: This sheet is a summary. It may not cover all possible information. If you have questions about this medicine, talk to your doctor, pharmacist, or health care provider.  2023 Elsevier/Gold Standard (2021-12-08 00:00:00)       To help prevent nausea and vomiting after your treatment, we encourage you to take your nausea medication as directed.  BELOW ARE SYMPTOMS THAT SHOULD BE REPORTED IMMEDIATELY: *FEVER GREATER THAN 100.4 F (38 C) OR HIGHER *CHILLS OR SWEATING *NAUSEA AND VOMITING THAT IS NOT CONTROLLED WITH YOUR NAUSEA MEDICATION *UNUSUAL SHORTNESS OF BREATH *UNUSUAL BRUISING OR BLEEDING *URINARY PROBLEMS (pain or burning when urinating, or frequent urination) *BOWEL PROBLEMS (unusual diarrhea, constipation, pain near the anus) TENDERNESS IN MOUTH AND THROAT WITH OR WITHOUT PRESENCE OF ULCERS (sore throat, sores in mouth, or a toothache) UNUSUAL RASH, SWELLING OR PAIN  UNUSUAL VAGINAL DISCHARGE OR ITCHING   Items with *   indicate a potential emergency and should be followed up as soon as possible or go to the Emergency Department if any problems should occur.  Please show the CHEMOTHERAPY ALERT CARD or IMMUNOTHERAPY ALERT CARD at check-in to the Emergency Department and triage nurse.  Should you have questions after your visit or need to cancel or reschedule your appointment, please contact [facility] [phone]  and follow the prompts.  Office hours are 8:00 a.m. to 4:30 p.m. Monday - Friday. Please note that voicemails left after 4:00 p.m. may not be returned until the following business day.  We are closed weekends and major holidays. You have access to  a nurse at all times for urgent questions. Please call the main number to the clinic [phone] and follow the prompts.  For any non-urgent questions, you may also contact your provider using MyChart. We now offer e-Visits for anyone 18 and older to request care online for non-urgent symptoms. For details visit [email or web address].   Also download the MyChart app! Go to the app store, search "MyChart", open the app, select [facility], and log in with your MyChart username and password.   

## 2022-12-21 ENCOUNTER — Inpatient Hospital Stay: Payer: 59

## 2022-12-21 VITALS — BP 97/65 | HR 83 | Temp 98.4°F | Resp 18

## 2022-12-21 DIAGNOSIS — C3491 Malignant neoplasm of unspecified part of right bronchus or lung: Secondary | ICD-10-CM

## 2022-12-21 DIAGNOSIS — Z95828 Presence of other vascular implants and grafts: Secondary | ICD-10-CM

## 2022-12-21 MED ORDER — SODIUM CHLORIDE 0.9% FLUSH
10.0000 mL | INTRAVENOUS | Status: DC | PRN
  Administered 2022-12-21: 10 mL

## 2022-12-21 MED ORDER — SODIUM CHLORIDE 0.9 % IV SOLN
80.0000 mg/m2 | Freq: Once | INTRAVENOUS | Status: AC
  Administered 2022-12-21: 140 mg via INTRAVENOUS
  Filled 2022-12-21: qty 7

## 2022-12-21 MED ORDER — SODIUM CHLORIDE 0.9 % IV SOLN: Freq: Once | INTRAVENOUS | Status: AC

## 2022-12-21 MED ORDER — HEPARIN SOD (PORK) LOCK FLUSH 100 UNIT/ML IV SOLN
500.0000 [IU] | Freq: Once | INTRAVENOUS | Status: AC | PRN
  Administered 2022-12-21: 500 [IU]

## 2022-12-21 MED ORDER — SODIUM CHLORIDE 0.9 % IV SOLN
10.0000 mg | Freq: Once | INTRAVENOUS | Status: AC
  Administered 2022-12-21: 10 mg via INTRAVENOUS
  Filled 2022-12-21: qty 10

## 2022-12-21 NOTE — Progress Notes (Signed)
Patient tolerated chemotherapy with no complaints voiced. Side effects with management reviewed understanding verbalized. Port site clean and dry with no bruising or swelling noted at site. Good blood return noted before and after administration of chemotherapy. Band aid applied. Patient left in satisfactory condition with VSS and no s/s of distress noted. 

## 2022-12-21 NOTE — Patient Instructions (Signed)
[facility]  Discharge Instructions: Thank you for choosing [facility] to provide your oncology and hematology care.  If you have a lab appointment with the Cancer Center - please note that after April 8th, 2024, all labs will be drawn in the cancer center.  You do not have to check in or register with the main entrance as you have in the past but will complete your check-in in the cancer center.  Wear comfortable clothing and clothing appropriate for easy access to any Portacath or PICC line.   We strive to give you quality time with your provider. You may need to reschedule your appointment if you arrive late (15 or more minutes).  Arriving late affects you and other patients whose appointments are after yours.  Also, if you miss three or more appointments without notifying the office, you may be dismissed from the clinic at the provider's discretion.      For prescription refill requests, have your pharmacy contact our office and allow 72 hours for refills to be completed.    Today you received the following chemotherapy and/or immunotherapy agents Etoposide, return as scheduled.   To help prevent nausea and vomiting after your treatment, we encourage you to take your nausea medication as directed.  BELOW ARE SYMPTOMS THAT SHOULD BE REPORTED IMMEDIATELY: *FEVER GREATER THAN 100.4 F (38 C) OR HIGHER *CHILLS OR SWEATING *NAUSEA AND VOMITING THAT IS NOT CONTROLLED WITH YOUR NAUSEA MEDICATION *UNUSUAL SHORTNESS OF BREATH *UNUSUAL BRUISING OR BLEEDING *URINARY PROBLEMS (pain or burning when urinating, or frequent urination) *BOWEL PROBLEMS (unusual diarrhea, constipation, pain near the anus) TENDERNESS IN MOUTH AND THROAT WITH OR WITHOUT PRESENCE OF ULCERS (sore throat, sores in mouth, or a toothache) UNUSUAL RASH, SWELLING OR PAIN  UNUSUAL VAGINAL DISCHARGE OR ITCHING   Items with * indicate a potential emergency and should be followed up as soon as  possible or go to the Emergency Department if any problems should occur.  Please show the CHEMOTHERAPY ALERT CARD or IMMUNOTHERAPY ALERT CARD at check-in to the Emergency Department and triage nurse.  Should you have questions after your visit or need to cancel or reschedule your appointment, please contact [facility] [phone]  and follow the prompts.  Office hours are 8:00 a.m. to 4:30 p.m. Monday - Friday. Please note that voicemails left after 4:00 p.m. may not be returned until the following business day.  We are closed weekends and major holidays. You have access to a nurse at all times for urgent questions. Please call the main number to the clinic [phone] and follow the prompts.  For any non-urgent questions, you may also contact your provider using MyChart. We now offer e-Visits for anyone 19 and older to request care online for non-urgent symptoms. For details visit [email or web address].   Also download the MyChart app! Go to the app store, search "MyChart", open the app, select , and log in with your MyChart username and password.

## 2022-12-21 NOTE — Progress Notes (Signed)
Pt presents today for D3 Etoposide per provider's order. Vital signs stable and pt voiced no new complaints at this time.

## 2022-12-23 ENCOUNTER — Inpatient Hospital Stay (HOSPITAL_COMMUNITY)
Admission: EM | Admit: 2022-12-23 | Discharge: 2022-12-27 | DRG: 200 | Disposition: A | Discharge status: Home / Self Care | Payer: 59 | Source: Ambulatory Visit | Attending: Internal Medicine | Admitting: Internal Medicine

## 2022-12-23 ENCOUNTER — Telehealth: Payer: Self-pay | Admitting: Acute Care

## 2022-12-23 ENCOUNTER — Other Ambulatory Visit: Payer: Self-pay

## 2022-12-23 ENCOUNTER — Encounter (HOSPITAL_COMMUNITY): Payer: Self-pay | Admitting: Family Medicine

## 2022-12-23 ENCOUNTER — Emergency Department (HOSPITAL_COMMUNITY): Payer: 59

## 2022-12-23 ENCOUNTER — Telehealth: Payer: Self-pay | Admitting: *Deleted

## 2022-12-23 ENCOUNTER — Encounter: Payer: Self-pay | Admitting: Hematology

## 2022-12-23 ENCOUNTER — Inpatient Hospital Stay: Payer: 59

## 2022-12-23 DIAGNOSIS — C3491 Malignant neoplasm of unspecified part of right bronchus or lung: Secondary | ICD-10-CM | POA: Diagnosis present

## 2022-12-23 DIAGNOSIS — G894 Chronic pain syndrome: Secondary | ICD-10-CM | POA: Diagnosis present

## 2022-12-23 DIAGNOSIS — J930 Spontaneous tension pneumothorax: Secondary | ICD-10-CM

## 2022-12-23 DIAGNOSIS — Z9221 Personal history of antineoplastic chemotherapy: Secondary | ICD-10-CM | POA: Diagnosis not present

## 2022-12-23 DIAGNOSIS — Z9861 Coronary angioplasty status: Secondary | ICD-10-CM | POA: Diagnosis not present

## 2022-12-23 DIAGNOSIS — C7931 Secondary malignant neoplasm of brain: Secondary | ICD-10-CM | POA: Diagnosis present

## 2022-12-23 DIAGNOSIS — I2510 Atherosclerotic heart disease of native coronary artery without angina pectoris: Secondary | ICD-10-CM | POA: Diagnosis present

## 2022-12-23 DIAGNOSIS — J9383 Other pneumothorax: Principal | ICD-10-CM | POA: Diagnosis present

## 2022-12-23 DIAGNOSIS — C7989 Secondary malignant neoplasm of other specified sites: Secondary | ICD-10-CM | POA: Diagnosis present

## 2022-12-23 DIAGNOSIS — Z9981 Dependence on supplemental oxygen: Secondary | ICD-10-CM

## 2022-12-23 DIAGNOSIS — J84112 Idiopathic pulmonary fibrosis: Secondary | ICD-10-CM | POA: Diagnosis present

## 2022-12-23 DIAGNOSIS — Z681 Body mass index (BMI) 19 or less, adult: Secondary | ICD-10-CM

## 2022-12-23 DIAGNOSIS — M549 Dorsalgia, unspecified: Secondary | ICD-10-CM | POA: Diagnosis present

## 2022-12-23 DIAGNOSIS — C3490 Malignant neoplasm of unspecified part of unspecified bronchus or lung: Secondary | ICD-10-CM

## 2022-12-23 DIAGNOSIS — G8929 Other chronic pain: Secondary | ICD-10-CM | POA: Diagnosis present

## 2022-12-23 DIAGNOSIS — G1221 Amyotrophic lateral sclerosis: Secondary | ICD-10-CM | POA: Diagnosis present

## 2022-12-23 DIAGNOSIS — Z79899 Other long term (current) drug therapy: Secondary | ICD-10-CM

## 2022-12-23 DIAGNOSIS — J9621 Acute and chronic respiratory failure with hypoxia: Secondary | ICD-10-CM | POA: Diagnosis present

## 2022-12-23 DIAGNOSIS — J9312 Secondary spontaneous pneumothorax: Secondary | ICD-10-CM

## 2022-12-23 DIAGNOSIS — R630 Anorexia: Secondary | ICD-10-CM | POA: Diagnosis present

## 2022-12-23 DIAGNOSIS — Z1152 Encounter for screening for COVID-19: Secondary | ICD-10-CM

## 2022-12-23 DIAGNOSIS — Z87891 Personal history of nicotine dependence: Secondary | ICD-10-CM

## 2022-12-23 DIAGNOSIS — J95811 Postprocedural pneumothorax: Secondary | ICD-10-CM | POA: Diagnosis present

## 2022-12-23 DIAGNOSIS — J4489 Other specified chronic obstructive pulmonary disease: Secondary | ICD-10-CM | POA: Diagnosis present

## 2022-12-23 DIAGNOSIS — J939 Pneumothorax, unspecified: Secondary | ICD-10-CM | POA: Diagnosis present

## 2022-12-23 DIAGNOSIS — J9601 Acute respiratory failure with hypoxia: Secondary | ICD-10-CM | POA: Diagnosis not present

## 2022-12-23 DIAGNOSIS — Z8673 Personal history of transient ischemic attack (TIA), and cerebral infarction without residual deficits: Secondary | ICD-10-CM | POA: Diagnosis not present

## 2022-12-23 DIAGNOSIS — R9389 Abnormal findings on diagnostic imaging of other specified body structures: Secondary | ICD-10-CM | POA: Diagnosis present

## 2022-12-23 DIAGNOSIS — Z981 Arthrodesis status: Secondary | ICD-10-CM | POA: Diagnosis not present

## 2022-12-23 DIAGNOSIS — K30 Functional dyspepsia: Secondary | ICD-10-CM | POA: Diagnosis present

## 2022-12-23 DIAGNOSIS — F32A Depression, unspecified: Secondary | ICD-10-CM | POA: Diagnosis present

## 2022-12-23 DIAGNOSIS — F419 Anxiety disorder, unspecified: Secondary | ICD-10-CM | POA: Diagnosis present

## 2022-12-23 DIAGNOSIS — R54 Age-related physical debility: Secondary | ICD-10-CM | POA: Diagnosis present

## 2022-12-23 DIAGNOSIS — I10 Essential (primary) hypertension: Secondary | ICD-10-CM | POA: Diagnosis present

## 2022-12-23 DIAGNOSIS — Z95828 Presence of other vascular implants and grafts: Secondary | ICD-10-CM

## 2022-12-23 DIAGNOSIS — Z888 Allergy status to other drugs, medicaments and biological substances status: Secondary | ICD-10-CM

## 2022-12-23 DIAGNOSIS — J9382 Other air leak: Secondary | ICD-10-CM | POA: Diagnosis present

## 2022-12-23 DIAGNOSIS — J9611 Chronic respiratory failure with hypoxia: Secondary | ICD-10-CM | POA: Diagnosis present

## 2022-12-23 DIAGNOSIS — R591 Generalized enlarged lymph nodes: Secondary | ICD-10-CM | POA: Diagnosis present

## 2022-12-23 LAB — CBC WITH DIFFERENTIAL/PLATELET
Abs Immature Granulocytes: 0.06 10*3/uL (ref 0.00–0.07)
Basophils Absolute: 0.1 10*3/uL (ref 0.0–0.1)
Basophils Relative: 1 %
Eosinophils Absolute: 0.1 10*3/uL (ref 0.0–0.5)
Eosinophils Relative: 1 %
HCT: 44.2 % (ref 39.0–52.0)
Hemoglobin: 13.9 g/dL (ref 13.0–17.0)
Immature Granulocytes: 1 %
Lymphocytes Relative: 10 %
Lymphs Abs: 1.1 10*3/uL (ref 0.7–4.0)
MCH: 28.9 pg (ref 26.0–34.0)
MCHC: 31.4 g/dL (ref 30.0–36.0)
MCV: 91.9 fL (ref 80.0–100.0)
Monocytes Absolute: 0.2 10*3/uL (ref 0.1–1.0)
Monocytes Relative: 1 %
Neutro Abs: 9.8 10*3/uL — ABNORMAL HIGH (ref 1.7–7.7)
Neutrophils Relative %: 86 %
Platelets: 314 10*3/uL (ref 150–400)
RBC: 4.81 MIL/uL (ref 4.22–5.81)
RDW: 13.9 % (ref 11.5–15.5)
WBC: 11.3 10*3/uL — ABNORMAL HIGH (ref 4.0–10.5)
nRBC: 0 % (ref 0.0–0.2)

## 2022-12-23 LAB — COMPREHENSIVE METABOLIC PANEL
ALT: 23 U/L (ref 0–44)
AST: 25 U/L (ref 15–41)
Albumin: 3.7 g/dL (ref 3.5–5.0)
Alkaline Phosphatase: 68 U/L (ref 38–126)
Anion gap: 12 (ref 5–15)
BUN: 14 mg/dL (ref 6–20)
CO2: 30 mmol/L (ref 22–32)
Calcium: 9.5 mg/dL (ref 8.9–10.3)
Chloride: 94 mmol/L — ABNORMAL LOW (ref 98–111)
Creatinine, Ser: 0.71 mg/dL (ref 0.61–1.24)
GFR, Estimated: >60 mL/min (ref >60)
Glucose, Bld: 144 mg/dL — ABNORMAL HIGH (ref 70–99)
Potassium: 4 mmol/L (ref 3.5–5.1)
Sodium: 136 mmol/L (ref 135–145)
Total Bilirubin: 0.9 mg/dL (ref 0.3–1.2)
Total Protein: 8.6 g/dL — ABNORMAL HIGH (ref 6.5–8.1)

## 2022-12-23 LAB — LACTIC ACID, PLASMA
Lactic Acid, Venous: 1.9 mmol/L (ref 0.5–1.9)
Lactic Acid, Venous: 1 mmol/L (ref 0.5–1.9)

## 2022-12-23 LAB — TROPONIN I (HIGH SENSITIVITY)
Troponin I (High Sensitivity): 4 ng/L (ref <18)
Troponin I (High Sensitivity): 4 ng/L (ref <18)

## 2022-12-23 LAB — CULTURE, BLOOD (ROUTINE X 2)

## 2022-12-23 LAB — MAGNESIUM: Magnesium: 1.9 mg/dL (ref 1.7–2.4)

## 2022-12-23 LAB — SARS CORONAVIRUS 2 BY RT PCR: SARS Coronavirus 2 by RT PCR: NEGATIVE

## 2022-12-23 LAB — BRAIN NATRIURETIC PEPTIDE: B Natriuretic Peptide: 39 pg/mL (ref 0.0–100.0)

## 2022-12-23 LAB — PHOSPHORUS: Phosphorus: 3.8 mg/dL (ref 2.5–4.6)

## 2022-12-23 MED ORDER — OXYCODONE HCL 5 MG PO TABS
15.0000 mg | ORAL_TABLET | Freq: Four times a day (QID) | ORAL | Status: DC | PRN
  Administered 2022-12-23 – 2022-12-26 (×7): 15 mg via ORAL
  Filled 2022-12-23 (×7): qty 3

## 2022-12-23 MED ORDER — IBUPROFEN 800 MG PO TABS: 800.0000 mg | ORAL_TABLET | Freq: Three times a day (TID) | ORAL | Status: DC | PRN

## 2022-12-23 MED ORDER — HYDROMORPHONE HCL 1 MG/ML IJ SOLN
1.0000 mg | Freq: Once | INTRAMUSCULAR | Status: AC
  Administered 2022-12-23: 1 mg via INTRAVENOUS
  Filled 2022-12-23: qty 1

## 2022-12-23 MED ORDER — IPRATROPIUM BROMIDE 0.02 % IN SOLN
0.5000 mg | Freq: Four times a day (QID) | RESPIRATORY_TRACT | Status: DC
  Administered 2022-12-23 – 2022-12-24 (×3): 0.5 mg via RESPIRATORY_TRACT
  Filled 2022-12-23 (×2): qty 2.5

## 2022-12-23 MED ORDER — MEGESTROL ACETATE 400 MG/10ML PO SUSP
400.0000 mg | Freq: Two times a day (BID) | ORAL | Status: DC
  Administered 2022-12-23 – 2022-12-26 (×6): 400 mg via ORAL
  Filled 2022-12-23 (×9): qty 10

## 2022-12-23 MED ORDER — NINTEDANIB ESYLATE 150 MG PO CAPS
150.0000 mg | ORAL_CAPSULE | Freq: Two times a day (BID) | ORAL | Status: DC
  Administered 2022-12-23: 150 mg via ORAL

## 2022-12-23 MED ORDER — ACETAMINOPHEN 325 MG PO TABS
650.0000 mg | ORAL_TABLET | Freq: Four times a day (QID) | ORAL | Status: DC | PRN
  Administered 2022-12-25: 650 mg via ORAL
  Filled 2022-12-23 (×2): qty 2

## 2022-12-23 MED ORDER — SODIUM CHLORIDE 0.9% FLUSH
3.0000 mL | Freq: Two times a day (BID) | INTRAVENOUS | Status: DC
  Administered 2022-12-23 – 2022-12-27 (×9): 3 mL via INTRAVENOUS

## 2022-12-23 MED ORDER — DULOXETINE HCL 60 MG PO CPEP
60.0000 mg | ORAL_CAPSULE | Freq: Every day | ORAL | Status: DC
  Administered 2022-12-23 – 2022-12-27 (×5): 60 mg via ORAL
  Filled 2022-12-23 (×5): qty 1

## 2022-12-23 MED ORDER — LORAZEPAM 0.5 MG PO TABS
0.5000 mg | ORAL_TABLET | Freq: Four times a day (QID) | ORAL | Status: DC | PRN
  Administered 2022-12-24 – 2022-12-26 (×3): 0.5 mg via ORAL
  Filled 2022-12-23 (×3): qty 1

## 2022-12-23 MED ORDER — ACETAMINOPHEN 650 MG RE SUPP: 650.0000 mg | Freq: Four times a day (QID) | RECTAL | Status: DC | PRN

## 2022-12-23 MED ORDER — IPRATROPIUM BROMIDE 0.02 % IN SOLN: 0.5000 mg | Freq: Four times a day (QID) | RESPIRATORY_TRACT | Status: DC | PRN

## 2022-12-23 MED ORDER — HYDROMORPHONE HCL 1 MG/ML IJ SOLN
0.5000 mg | INTRAMUSCULAR | Status: DC | PRN
  Administered 2022-12-23 – 2022-12-27 (×30): 1 mg via INTRAVENOUS
  Filled 2022-12-23 (×31): qty 1

## 2022-12-23 MED ORDER — METOPROLOL TARTRATE 25 MG PO TABS
25.0000 mg | ORAL_TABLET | Freq: Two times a day (BID) | ORAL | Status: DC
  Administered 2022-12-24 – 2022-12-27 (×5): 25 mg via ORAL
  Filled 2022-12-23 (×8): qty 1

## 2022-12-23 MED ORDER — BUDESONIDE 0.25 MG/2ML IN SUSP
RESPIRATORY_TRACT | Status: AC
  Administered 2022-12-23: 0.25 mg via RESPIRATORY_TRACT
  Filled 2022-12-23: qty 2

## 2022-12-23 MED ORDER — BENZONATATE 100 MG PO CAPS
200.0000 mg | ORAL_CAPSULE | Freq: Three times a day (TID) | ORAL | Status: DC | PRN
  Administered 2022-12-24: 200 mg via ORAL
  Filled 2022-12-23: qty 2

## 2022-12-23 MED ORDER — ZOLPIDEM TARTRATE 5 MG PO TABS
5.0000 mg | ORAL_TABLET | Freq: Every evening | ORAL | Status: DC | PRN
  Administered 2022-12-24 – 2022-12-26 (×2): 5 mg via ORAL
  Filled 2022-12-23 (×2): qty 1

## 2022-12-23 MED ORDER — HYDRALAZINE HCL 20 MG/ML IJ SOLN: 10.0000 mg | INTRAMUSCULAR | Status: DC | PRN

## 2022-12-23 MED ORDER — ONDANSETRON HCL 4 MG/2ML IJ SOLN: 4.0000 mg | Freq: Four times a day (QID) | INTRAMUSCULAR | Status: DC | PRN

## 2022-12-23 MED ORDER — FLEET ENEMA 7-19 GM/118ML RE ENEM: 1.0000 | ENEMA | Freq: Once | RECTAL | Status: DC | PRN

## 2022-12-23 MED ORDER — IPRATROPIUM BROMIDE 0.02 % IN SOLN
RESPIRATORY_TRACT | Status: AC
  Filled 2022-12-23: qty 2.5

## 2022-12-23 MED ORDER — BUDESONIDE 0.25 MG/2ML IN SUSP
0.2500 mg | Freq: Two times a day (BID) | RESPIRATORY_TRACT | Status: DC
  Administered 2022-12-24 – 2022-12-26 (×5): 0.25 mg via RESPIRATORY_TRACT
  Filled 2022-12-23 (×8): qty 2

## 2022-12-23 MED ORDER — METHYLPREDNISOLONE SODIUM SUCC 125 MG IJ SOLR
80.0000 mg | Freq: Two times a day (BID) | INTRAMUSCULAR | Status: DC
  Administered 2022-12-23 – 2022-12-25 (×4): 80 mg via INTRAVENOUS
  Filled 2022-12-23 (×4): qty 2

## 2022-12-23 MED ORDER — LEVALBUTEROL HCL 0.63 MG/3ML IN NEBU
0.6300 mg | INHALATION_SOLUTION | Freq: Four times a day (QID) | RESPIRATORY_TRACT | Status: DC
  Administered 2022-12-23 – 2022-12-24 (×3): 0.63 mg via RESPIRATORY_TRACT
  Filled 2022-12-23 (×2): qty 3

## 2022-12-23 MED ORDER — SENNOSIDES-DOCUSATE SODIUM 8.6-50 MG PO TABS
1.0000 | ORAL_TABLET | Freq: Every evening | ORAL | Status: DC | PRN
  Administered 2022-12-24: 1 via ORAL
  Filled 2022-12-23: qty 1

## 2022-12-23 MED ORDER — LORAZEPAM 0.5 MG PO TABS: 0.5000 mg | ORAL_TABLET | Freq: Once | ORAL | Status: DC | PRN

## 2022-12-23 MED ORDER — SODIUM CHLORIDE 0.9 % IV SOLN: 250.0000 mL | INTRAVENOUS | Status: DC | PRN

## 2022-12-23 MED ORDER — OXYCODONE HCL ER 20 MG PO T12A
20.0000 mg | EXTENDED_RELEASE_TABLET | Freq: Two times a day (BID) | ORAL | Status: DC
  Administered 2022-12-23 – 2022-12-27 (×8): 20 mg via ORAL
  Filled 2022-12-23 (×8): qty 1

## 2022-12-23 MED ORDER — LEVALBUTEROL HCL 0.63 MG/3ML IN NEBU
INHALATION_SOLUTION | RESPIRATORY_TRACT | Status: AC
  Filled 2022-12-23: qty 3

## 2022-12-23 MED ORDER — OXYCODONE HCL 5 MG PO TABS: 5.0000 mg | ORAL_TABLET | ORAL | Status: DC | PRN

## 2022-12-23 MED ORDER — BISACODYL 5 MG PO TBEC
5.0000 mg | DELAYED_RELEASE_TABLET | Freq: Every day | ORAL | Status: DC | PRN
  Administered 2022-12-24: 5 mg via ORAL
  Filled 2022-12-23: qty 1

## 2022-12-23 MED ORDER — LEVALBUTEROL HCL 0.63 MG/3ML IN NEBU: 0.6300 mg | INHALATION_SOLUTION | Freq: Four times a day (QID) | RESPIRATORY_TRACT | Status: DC | PRN

## 2022-12-23 MED ORDER — HEPARIN SODIUM (PORCINE) 5000 UNIT/ML IJ SOLN
5000.0000 [IU] | Freq: Three times a day (TID) | INTRAMUSCULAR | Status: DC
  Administered 2022-12-23 – 2022-12-27 (×12): 5000 [IU] via SUBCUTANEOUS
  Filled 2022-12-23 (×13): qty 1

## 2022-12-23 MED ORDER — FAMOTIDINE 20 MG PO TABS
20.0000 mg | ORAL_TABLET | Freq: Two times a day (BID) | ORAL | Status: DC
  Administered 2022-12-23 – 2022-12-27 (×9): 20 mg via ORAL
  Filled 2022-12-23 (×9): qty 1

## 2022-12-23 MED ORDER — ONDANSETRON HCL 4 MG PO TABS: 4.0000 mg | ORAL_TABLET | Freq: Four times a day (QID) | ORAL | Status: DC | PRN

## 2022-12-23 MED ORDER — SODIUM CHLORIDE 0.9% FLUSH
10.0000 mL | Freq: Three times a day (TID) | INTRAVENOUS | Status: DC
  Administered 2022-12-24 – 2022-12-27 (×9): 10 mL via INTRAPLEURAL

## 2022-12-23 MED ORDER — ALBUTEROL SULFATE (2.5 MG/3ML) 0.083% IN NEBU: 3.0000 mL | INHALATION_SOLUTION | RESPIRATORY_TRACT | Status: DC | PRN

## 2022-12-23 MED ORDER — ENSURE MAX PROTEIN PO LIQD
330.0000 mL | Freq: Three times a day (TID) | ORAL | Status: DC
  Administered 2022-12-23 – 2022-12-27 (×9): 330 mL via ORAL
  Filled 2022-12-23 (×13): qty 330

## 2022-12-23 NOTE — Hospital Course (Addendum)
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 Ellin Saba, COPD on 6 L oxygen, anxiety, depression, GERD, chronic pain syndrome, ALS (my atrophic lateral sclerosis) dysphagia, pulmonary fibrosis, migraine..  Status post recent port a catheter placement.  On 12/15/2022..  Presented today with chest pain and shortness of breath.  Chest pain progressively getting worse on the left side for past 2 days.  Associated with nonproductive cough. No relief from his current pain medications   Upon evaluation in ED patient was found to have a left-sided pneumothorax  ED course/evaluation: Blood pressure 118/77, pulse (!) 104, resp. rate 16, height 6' (1.829 m), weight 57.6 kg, SpO2 98 %.  6 L of oxygen  CBC BMP within normal limits - WBC of 11.3, chloride 94, glucose 144, troponin 4 BNP 39.0, lactic acid 1.0   Chest x-ray IMPRESSION: 1. Large left pneumothorax. Some rightward mediastinal shift raises concern for tension physiology component. 2. Known masses better characterized on recent CT chest. 3. Additional patchy interstitial and airspace opacities throughout the lungs could represent changes of emphysema but superimposed infection or aspiration is difficult to exclude  Status post successful chest tube placement pigtail by EDP Dr. Charm Barges He also has talked and informed PCCM at [facility] Dr. Craige Cotta  F/up CXR - IMPRESSION: Left chest tube in place with decreased size of the left pneumothorax, now small.   Patient is to be admitted to [facility] progressive unit, PCCM will be consulted

## 2022-12-23 NOTE — Progress Notes (Addendum)
Brief Pharmacy Note  Order for PTA nintedanib noted. Patient takes this for pulmonary fibrosis. He was transferred to [facility] with left pneumothorax; chest tube in place. Patient also with underlying stage IV lung cancer on chemotherapy.  This medication is not stocked by pharmacy. Patient will need to provide his home supply for administration inpatient.  Pricilla Riffle, PharmD, BCPS Clinical Pharmacist 12/23/2022 5:13 PM

## 2022-12-23 NOTE — Assessment & Plan Note (Addendum)
-   With a history of COPD, lung CA small cell, O2 dependent 6-7 L Currently on 6 L satting 98% -Chest tube in place on the left pigtail -Diffuse wheezing -Continue as needed ans scheduled DuoNeb bronchodilator treatments -Initiating IV steroids -Pulmicort nebs

## 2022-12-23 NOTE — Assessment & Plan Note (Signed)
-   Stable follow-up as an outpatient 

## 2022-12-23 NOTE — Assessment & Plan Note (Addendum)
Follow-up with Dr. Ellin Saba, under chemotherapy Reviewing and resuming medications accordingly (IV chemotherapy every 21 days)

## 2022-12-23 NOTE — Assessment & Plan Note (Signed)
-   Stable, review home medication will resume metoprolol, -Along with as needed IV hydralazine

## 2022-12-23 NOTE — Assessment & Plan Note (Signed)
-   Continue as needed Ativan 

## 2022-12-23 NOTE — Assessment & Plan Note (Signed)
-   Stable, reviewing and resuming home medication including Cymbalta

## 2022-12-23 NOTE — Progress Notes (Signed)
   PCCM transfer request    Sending physician: Meridee Score  Sending facility: [facility]  Reason for transfer: Pneumothorax  Brief case summary:  62 yo male with ILD and SCLC presented with shortness of breath.  Found to have tension pneumothorax.  EDP placed pig-tail catheter.    Recommendations made prior to transfer: Transfer to [facility] with PCCM to consult on arrival.  Transfer accepted: [facility] will consult PCCM on arrival to [place].    Coralyn Helling 12/23/22 11:36 AM [facility]  For contact information, see Amion. If no response to pager, please call PCCM consult pager. After hours, 7PM- 7AM, please call Elink.

## 2022-12-23 NOTE — Assessment & Plan Note (Addendum)
-  Exacerbated by pneumothorax, continue as needed IV analgesics along with home medication -Continuing long-acting pain medication including oxycodone

## 2022-12-23 NOTE — ED Provider Notes (Signed)
[place] EMERGENCY DEPARTMENT AT [facility] Provider Note   CSN: [id number] Arrival date & time: 12/23/22  4098     History  Chief Complaint  Patient presents with   Shortness of Breath    Hx lung ca, increased sob in last 24hrs     Bradley Rice is a 62 y.o. male.  He has a history of COPD, 6 L nasal cannula oxygen dependent, small cell lung cancer currently getting chemotherapy by Dr. Ellin Saba.  He said for the last 2 days he has been unable to breathe, worsening left-sided chest pain, shortness of breath, cough nonproductive.  He has been taking his oral pain medication without improvement.  The history is provided by the patient.  Shortness of Breath Severity:  Severe Onset quality:  Gradual Duration:  2 days Timing:  Constant Progression:  Worsening Chronicity:  New Relieved by:  Nothing Worsened by:  Activity and coughing Ineffective treatments:  Oxygen and rest Associated symptoms: chest pain and cough   Associated symptoms: no abdominal pain, no fever, no hemoptysis, no sputum production and no vomiting   Risk factors: hx of cancer        Home Medications Prior to Admission medications   Medication Sig Start Date End Date Taking? Authorizing Provider  albuterol (PROVENTIL) (2.5 MG/3ML) 0.083% nebulizer solution Take 3 mLs (2.5 mg total) by nebulization every 6 (six) hours as needed for wheezing Rice shortness of breath. 11/24/22   Glenford Bayley, NP  albuterol (VENTOLIN HFA) 108 (90 Base) MCG/ACT inhaler Inhale 2 puffs into the lungs every 6 (six) hours as needed for wheezing Rice shortness of breath. 11/24/22   Glenford Bayley, NP  benzonatate (TESSALON) 200 MG capsule Take 1 capsule (200 mg total) by mouth 3 (three) times daily as needed. 11/18/22   [name], FNP  blood glucose meter kit and supplies Dispense based on patient and insurance preference. Use up to four times daily as directed. (FOR ICD-10 E10.9, E11.9). 05/18/21   [name], FNP  busPIRone (BUSPAR) 5 MG tablet Take 1 tablet (5 mg total) by mouth 3 (three) times daily. 08/31/21   [name] A, FNP  CARBOPLATIN IV Inject into the vein every 21 ( twenty-one) days. 12/19/22   [provider]  DULoxetine (CYMBALTA) 60 MG capsule Take 1 capsule (60 mg total) by mouth daily. 09/01/22   [name], FNP  DURVALUMAB IV Inject into the vein every 21 ( twenty-one) days. 12/19/22   [provider]  Ensure Max Protein (ENSURE MAX PROTEIN) LIQD Take 330 mLs by mouth 3 (three) times daily. 11/18/22   [name], FNP  ETOPOSIDE IV Inject into the vein every 21 ( twenty-one) days. 12/19/22   [provider]  famotidine (PEPCID) 20 MG tablet Take 1 tablet (20 mg total) by mouth 2 (two) times daily. 12/20/22   Doreatha Massed, MD  fexofenadine (ALLEGRA) 180 MG tablet Take 1 tablet (180 mg total) by mouth daily. 02/28/22   [name] A, FNP  ibuprofen (ADVIL) 800 MG tablet Take 1 tablet (800 mg total) by mouth every 8 (eight) hours as needed. 11/22/22   [name], FNP  lidocaine-prilocaine (EMLA) cream Apply a small amount to port a cath site and cover with plastic wrap 1 hour prior to infusion appointments 12/15/22   Doreatha Massed, MD  LORazepam (ATIVAN) 0.5 MG tablet Take 1 tablet (0.5 mg total) by mouth once as needed for up to 1 dose  for anxiety. Take 1hr prior to scans 12/08/22   Doreatha Massed, MD  magic mouthwash w/lidocaine SOLN Take 10 mLs by mouth 3 (three) times daily as needed for mouth pain. 11/18/22   [name], FNP  megestrol (MEGACE) 400 MG/10ML suspension Take 10 mLs (400 mg total) by mouth 2 (two) times daily. 12/19/22   Doreatha Massed, MD  methocarbamol (ROBAXIN) 500 MG tablet Take 1 tablet (500 mg total) by mouth 2 (two) times daily as needed for muscle spasms. 02/28/22   [name], FNP  metoprolol tartrate (LOPRESSOR) 50 MG tablet Take 0.5 tablets (25 mg total) by mouth 2 (two) times daily.  11/11/22   Cleora Fleet, MD  Misc. Devices MISC Patient require 6L Nasal Cannula Oxygen 12/13/22   Doreatha Massed, MD  naloxone [facility]) nasal spray 4 mg/0.1 mL Place 1 spray into the nose as needed (opioid overdose).    [provider]  Nintedanib (OFEV) 150 MG CAPS Take 1 capsule (150 mg total) by mouth 2 (two) times daily. 11/29/22   Glenford Bayley, NP  nortriptyline (PAMELOR) 50 MG capsule Take 50 mg by mouth 2 (two) times daily. 11/28/16   [provider]  oxyCODONE (OXYCONTIN) 20 mg 12 hr tablet Take 1 tablet (20 mg total) by mouth every 12 (twelve) hours. 12/19/22   Doreatha Massed, MD  oxyCODONE (ROXICODONE) 15 MG immediate release tablet Take 15 mg by mouth 4 (four) times daily. 06/22/18   [provider]  Oxycodone HCl 20 MG TABS SMARTSIG:1 Tablet(s) By Mouth Every 12 Hours 12/19/22   [provider]  prochlorperazine (COMPAZINE) 10 MG tablet Take 1 tablet (10 mg total) by mouth every 6 (six) hours as needed for nausea Rice vomiting. 12/15/22   Doreatha Massed, MD  sildenafil (REVATIO) 20 MG tablet Take by mouth. 11/28/16   [provider]  topiramate (TOPAMAX) 50 MG tablet Take by mouth. 11/28/16   [provider]  traZODone (DESYREL) 50 MG tablet Take 2 tablets (100 mg total) by mouth at bedtime. 11/11/22   Cleora Fleet, MD      Allergies    Pirfenidone    Review of Systems   Review of Systems  Constitutional:  Negative for fever.  Eyes:  Negative for visual disturbance.  Respiratory:  Positive for cough and shortness of breath. Negative for hemoptysis and sputum production.   Cardiovascular:  Positive for chest pain.  Gastrointestinal:  Negative for abdominal pain and vomiting.    Physical Exam Updated Vital Signs BP 114/74 (BP Location: Left Arm)   Pulse 92   Temp 98.4 F (36.9 C) (Oral)   Resp 20   Ht 6' (1.829 m)   Wt 57.6 kg   SpO2 100%   BMI 17.22 kg/m  Physical Exam Vitals and nursing  note reviewed.  Constitutional:      General: He is in acute distress.     Appearance: Normal appearance. He is well-developed.  HENT:     Head: Normocephalic and atraumatic.  Eyes:     Conjunctiva/sclera: Conjunctivae normal.  Cardiovascular:     Rate and Rhythm: Normal rate and regular rhythm.     Heart sounds: No murmur heard. Pulmonary:     Effort: Tachypnea, accessory muscle usage and respiratory distress present.     Breath sounds: Examination of the left-upper field reveals decreased breath sounds. Examination of the left-middle field reveals decreased breath sounds. Examination of the left-lower field reveals decreased breath sounds. Decreased breath sounds present.  Abdominal:     Palpations: Abdomen is soft.     Tenderness: There is no abdominal tenderness.  Musculoskeletal:        General: No swelling.     Cervical back: Neck supple.     Right lower leg: No tenderness. No edema.     Left lower leg: No tenderness. No edema.  Skin:    General: Skin is warm and dry.     Capillary Refill: Capillary refill takes less than 2 seconds.  Neurological:     General: No focal deficit present.     Mental Status: He is alert.     Sensory: No sensory deficit.     Motor: No weakness.     ED Results / Procedures / Treatments   Labs (all labs ordered are listed, but only abnormal results are displayed) Labs Reviewed  CULTURE, BLOOD (ROUTINE X 2)  CULTURE, BLOOD (ROUTINE X 2)  SARS CORONAVIRUS 2 BY RT PCR  COMPREHENSIVE METABOLIC PANEL  CBC WITH DIFFERENTIAL/PLATELET  LACTIC ACID, PLASMA  LACTIC ACID, PLASMA  TROPONIN I (HIGH SENSITIVITY)    EKG None  Radiology No results found.  Procedures CHEST TUBE INSERTION  Date/Time: 12/23/2022 11:09 AM  Performed by: Terrilee Files, MD Authorized by: Terrilee Files, MD   Consent:    Consent obtained:  Verbal   Consent given by:  Patient   Risks, benefits, and alternatives were discussed: yes     Risks discussed:   Bleeding, damage to surrounding structures, infection, nerve damage, pain and incomplete drainage   Alternatives discussed:  Delayed treatment, referral and observation Universal protocol:    Procedure explained and questions answered to patient Rice proxy's satisfaction: yes     Patient identity confirmed:  Verbally with patient and arm band Pre-procedure details:    Skin preparation:  Chlorhexidine with alcohol   Preparation: Patient was prepped and draped in the usual sterile fashion   Sedation:    Sedation type:  None Anesthesia:    Anesthesia method:  Local infiltration   Local anesthetic:  Lidocaine 1% w/o epi Procedure details:    Placement location:  L lateral   Tube size (Fr):  Minicatheter   Ultrasound guidance: no     Tension pneumothorax: no     Tube connected to:  Suction   Drainage characteristics:  Air only   Suture material:  2-0 silk   Dressing:  Xeroform gauze Post-procedure details:    Post-insertion x-ray findings: tube in good position     Procedure completion:  Tolerated well, no immediate complications .Critical Care  Performed by: Terrilee Files, MD Authorized by: Terrilee Files, MD   Critical care provider statement:    Critical care time (minutes):  45   Critical care time was exclusive of:  Separately billable procedures and treating other patients   Critical care was necessary to treat Rice prevent imminent Rice life-threatening deterioration of the following conditions:  Respiratory failure   Critical care was time spent personally by me on the following activities:  Development of treatment plan with patient Rice surrogate, discussions with consultants, evaluation of patient's response to treatment, examination of patient, obtaining history from patient Rice surrogate, ordering and performing treatments and interventions, ordering and review of laboratory studies, ordering and review of radiographic studies, pulse oximetry, re-evaluation of patient's  condition and review of old charts   I assumed direction of critical care for this patient from another provider in my specialty: no  Medications Ordered in ED Medications  heparin injection 5,000 Units (5,000 Units Subcutaneous Given 12/23/22 1646)  sodium chloride flush (NS) 0.9 % injection 3 mL (3 mLs Intravenous Given 12/23/22 1647)  0.9 %  sodium chloride infusion (has no administration in time range)  acetaminophen (TYLENOL) tablet 650 mg (has no administration in time range)    Rice  acetaminophen (TYLENOL) suppository 650 mg (has no administration in time range)  HYDROmorphone (DILAUDID) injection 0.5-1 mg (1 mg Intravenous Given 12/23/22 1647)  zolpidem (AMBIEN) tablet 5 mg (has no administration in time range)  senna-docusate (Senokot-S) tablet 1 tablet (has no administration in time range)  bisacodyl (DULCOLAX) EC tablet 5 mg (has no administration in time range)  sodium phosphate (FLEET) 7-19 GM/118ML enema 1 enema (has no administration in time range)  ondansetron (ZOFRAN) tablet 4 mg (has no administration in time range)    Rice  ondansetron (ZOFRAN) injection 4 mg (has no administration in time range)  hydrALAZINE (APRESOLINE) injection 10 mg (has no administration in time range)  oxyCODONE (OXYCONTIN) 12 hr tablet 20 mg (has no administration in time range)  ibuprofen (ADVIL) tablet 800 mg (has no administration in time range)  megestrol (MEGACE) 400 MG/10ML suspension 400 mg (400 mg Oral Given 12/23/22 1646)  metoprolol tartrate (LOPRESSOR) tablet 25 mg (25 mg Oral Not Given 12/23/22 1708)  DULoxetine (CYMBALTA) DR capsule 60 mg (60 mg Oral Given 12/23/22 1647)  famotidine (PEPCID) tablet 20 mg (20 mg Oral Given 12/23/22 1647)  protein supplement (ENSURE MAX) liquid (330 mLs Oral Given 12/23/22 1738)  benzonatate (TESSALON) capsule 200 mg (has no administration in time range)  Nintedanib (OFEV) CAPS 150 mg (has no administration in time range)  oxyCODONE (Oxy IR/ROXICODONE)  immediate release tablet 15 mg (has no administration in time range)  LORazepam (ATIVAN) tablet 0.5 mg (has no administration in time range)  methylPREDNISolone sodium succinate (SOLU-MEDROL) 125 mg/2 mL injection 80 mg (80 mg Intravenous Given 12/23/22 1646)  budesonide (PULMICORT) nebulizer solution 0.25 mg (0.25 mg Nebulization Not Given 12/23/22 1337)  levalbuterol (XOPENEX) nebulizer solution 0.63 mg (0.63 mg Nebulization Given 12/23/22 1335)  ipratropium (ATROVENT) nebulizer solution 0.5 mg (0.5 mg Nebulization Given 12/23/22 1335)  levalbuterol (XOPENEX) 0.63 MG/3ML nebulizer solution (  Not Given 12/23/22 1342)  ipratropium (ATROVENT) 0.02 % nebulizer solution (  Not Given 12/23/22 1341)  sodium chloride flush (NS) 0.9 % injection 10 mL (has no administration in time range)  HYDROmorphone (DILAUDID) injection 1 mg (1 mg Intravenous Given 12/23/22 1032)  HYDROmorphone (DILAUDID) injection 1 mg (1 mg Intravenous Given 12/23/22 1119)    ED Course/ Medical Decision Making/ A&P Clinical Course as of 12/23/22 1755  Fri Dec 23, 2022  1025 Updated Dr. Ellin Saba regarding the pneumothorax and he understands patient will need to be transferred [MB]  1038 Chest x-ray interpreted by me as moderate left pneumothorax. [MB]  1041 EKG not crossing in epic.  Sinus tachycardia nonspecific ST-T changes. [MB]  1124 After pigtail chest tube placement patient feels better with his breathing.  Chest x-ray showing improvement in expansion.  Awaiting radiology reading. [MB]  1134 Discussed with critical care on-call who said they would be available to assist the medicine team with chest tube management when he gets down to [facility]. [MB]  1234 Discussed with Dr. Flossie Dibble [facility] hospitalist who will evaluate patient for admission down to [facility] [MB]    Clinical Course User Index [MB] Terrilee Files, MD  Medical Decision Making Amount and/Rice Complexity of Data Reviewed Labs:  ordered. Radiology: ordered.  Risk Prescription drug management. Decision regarding hospitalization.   This patient complains of shortness of breath chest pain respiratory distress; this involves an extensive number of treatment Options and is a complaint that carries with it a high risk of complications and morbidity. The differential includes pneumonia, PE, pneumothorax, infection, musculoskeletal  I ordered, reviewed and interpreted labs, which included CBC with elevated white count stable hemoglobin, chemistries with mildly elevated glucose, COVID-negative, troponins flat, lactate normal, BNP normal, blood culture sent I ordered medication IV pain medication IV fluids and reviewed PMP when indicated. I ordered imaging studies which included chest x-ray and I independently    visualized and interpreted imaging which showed acute left-sided pneumothorax Additional history obtained from patient's wife Previous records obtained and reviewed in epic including recent oncology notes I consulted Dr. Ellin Saba oncology, Dr. Craige Cotta pulmonary, Dr. Flossie Dibble [facility] hospitalist and discussed lab and imaging findings and discussed disposition.  Cardiac monitoring reviewed, sinus tachycardia Social determinants considered, increase stressors Critical Interventions: Placement of chest tube for acute spontaneous pneumothorax  After the interventions stated above, I reevaluated the patient and found patient's pain and breathing to be improved Admission and further testing considered, patient will need transfer to [facility] for further management of chest tube spontaneous pneumothorax in the setting of lung cancer.  Patient in agreement with plan for admission.         Final Clinical Impression(s) / ED Diagnoses Final diagnoses:  Spontaneous pneumothorax  Small cell lung cancer [facility])    Rx / DC Orders ED Discharge Orders     None         Terrilee Files, MD 12/23/22 1759

## 2022-12-23 NOTE — Progress Notes (Signed)
Patient called the after hours nurse line and was advised to go to the ED after telephone triage at 0630 this morning. Patient presents to the [facility] stating that he has been up all night, short of breath, increased pain in the right rib/lung region. Patient has increased work of breathing with use of accessory muscles. Vital signs are as follows: BP 114/83 HR 123 SpO2 91% on 6L [place]  Call placed to Dr. Ellin Saba who recommends taking the patient to the ED. Report called and given to [facility], ED Charge RN. Patient transported to ED Room 3 and report given to [facility], ED RN.

## 2022-12-23 NOTE — Consult Note (Signed)
NAME:  Bradley Rice, MRN:  [id number], DOB:  1961-03-11, LOS: 0 ADMISSION DATE:  12/23/2022, CONSULTATION DATE:  5/24 REFERRING MD:  shahmedhi , CHIEF COMPLAINT:  Pneumothorax   History of Present Illness:  62 year old male w/ metastatic lung cancer (bronchogenic carcinoma). In usual state of health until 5/21 when he developed acute onset of chest tightness during chemotherapy. The pt felt is was c/w indigestion and was instructed to take pepcid. On 5/24 her contacted the after hours nurse line at 0630 w/ cc: increased shortness of breath, cough and orthopnea and increased right sided chest pain. He was instructed to go to the ER. CXR showed large left PTX w/ rightward shift in addition to known pulm masses and patchy inertial airspace disease. A small bore chest tube was placed by the EDP and he was admitted to the SDU/steep down unit    Pertinent  Medical History  Extensive stage small cell lung cancer (Right lower lobe bronchogenic carcinoma with lymphadenopathy in the chest, left neck, upper abdomen) s/p cycle 1 carboplatin, etoposide and Durvalumab.  Weight loss (20-30 lbs) Last CT chest: (11/22/2022): Multiple right lower lobe lung masses, bulky right hilar, mediastinal, right greater than left supraclavicular lymph nodes. Fibrotic interstitial lung disease.  Anorexia  Stroke vs brain METS May 2024  Significant Hospital Events: Including procedures, antibiotic start and stop dates in addition to other pertinent events   1/24 admitted w/ spont left PTX [place] cath placed in ER   Interim History / Subjective:  Still SOB but much better than prior to tube placement   Objective   Blood pressure 118/77, pulse (Abnormal) 104, resp. rate 16, height 6' (1.829 m), weight 57.6 kg, SpO2 98 %.       No intake or output data in the 24 hours ending 12/23/22 1308 Filed Weights   12/23/22 1043  Weight: 57.6 kg    Examinatio General: frail 62 year old male sitting up in bed no distress HENT:  temporal wasting no JVD  Lungs: crackles (fine) posterior and bilateral w/ diffuse wheezing  The left chest tube is in place. I do not see airleak PCXR: diffuse interstial airspace disease w/out sig change now has left CT w/ small residual PTX  Cardiovascular: RRR Abdomen: soft  Extremities: not tender  Neuro: awake and oriented  GU: voids   Resolved Hospital Problem list     Assessment & Plan:   Spont left PTX Dyspnea  Chest pain.  Stage IV lung cancer ALS Anxiety Depression Chronic pain    Pulm prob list  Spontaneous left pTX w/ resultant dyspnea and chest pain superimposed on underlying Stage IV lung cancer & Pulm fibrosis possible flare   Plan Cont supplemental oxygen  Cont steroids for now Keep CT 20 cm H2O sxn. Repeat CXR in am  Cont BDs and ICS We will cont to follow   Simonne Martinet ACNP-BC [facility] Pulmonary/Critical Care Pager # [phone] OR # [phone] if no answer              Best Practice (right click and "Reselect all SmartList Selections" daily)      Labs   CBC: Recent Labs  Lab 12/19/22 0756 12/23/22 1021  WBC 10.4 11.3*  NEUTROABS 7.1 9.8*  HGB 12.8* 13.9  HCT 40.8 44.2  MCV 91.9 91.9  PLT 347 314    Basic Metabolic Panel: Recent Labs  Lab 12/19/22 0756 12/23/22 1021 12/23/22 1153  NA 138 136  --   K 3.7  4.0  --   CL 98 94*  --   CO2 31 30  --   GLUCOSE 112* 144*  --   BUN 9 14  --   CREATININE 0.68 0.71  --   CALCIUM 9.5 9.5  --   MG 2.1  --  1.9  PHOS  --   --  3.8   GFR: Estimated Creatinine Clearance: 80 mL/min (by C-G formula based on SCr of 0.71 mg/dL). Recent Labs  Lab 12/19/22 0756 12/23/22 1021 12/23/22 1123  WBC 10.4 11.3*  --   LATICACIDVEN  --  1.9 1.0    Liver Function Tests: Recent Labs  Lab 12/19/22 0756 12/23/22 1021  AST 19 25  ALT 16 23  ALKPHOS 65 68  BILITOT 0.4 0.9  PROT 7.9 8.6*  ALBUMIN 3.2* 3.7   No results for input(s): "LIPASE", "AMYLASE" in the last 168 hours. No  results for input(s): "AMMONIA" in the last 168 hours.  ABG    Component Value Date/Time   PHART 7.47 (H) 11/08/2022 1751   PCO2ART 44 11/08/2022 1751   PO2ART 71 (L) 11/08/2022 1751   HCO3 31.3 (H) 11/08/2022 1751   TCO2 24 01/24/2018 1940   O2SAT 95.8 11/08/2022 1751     Coagulation Profile: No results for input(s): "INR", "PROTIME" in the last 168 hours.  Cardiac Enzymes: No results for input(s): "CKTOTAL", "CKMB", "CKMBINDEX", "TROPONINI" in the last 168 hours.  HbA1C: HB A1C (BAYER DCA - WAIVED)  Date/Time Value Ref Range Status  05/18/2021 11:38 AM 5.6 4.8 - 5.6 % Final    Comment:             Prediabetes: 5.7 - 6.4          Diabetes: >6.4          Glycemic control for adults with diabetes: <7.0               **Please note reference interval change**   02/03/2016 03:40 PM 5.6 <7.0 % Final    Comment:                                          Diabetic Adult            <7.0                                       Healthy Adult        4.3 - 5.7                                                           (DCCT/NGSP) American Diabetes Association's Summary of Glycemic Recommendations for Adults with Diabetes: Hemoglobin A1c <7.0%. More stringent glycemic goals (A1c <6.0%) may further reduce complications at the cost of increased risk of hypoglycemia.     CBG: No results for input(s): "GLUCAP" in the last 168 hours.  Review of Systems:   Review of Systems  Constitutional: Negative.   HENT: Negative.    Eyes: Negative.   Respiratory:  Positive for shortness of breath and wheezing. Negative for sputum production.   Cardiovascular:  Negative  for chest pain.  Gastrointestinal: Negative.   Genitourinary: Negative.   Musculoskeletal: Negative.   Skin: Negative.   Neurological: Negative.   Endo/Heme/Allergies: Negative.      Past Medical History:  He,  has a past medical history of ALS (amyotrophic lateral sclerosis) (HCC), Asthma, Back pain, COPD (chronic obstructive  pulmonary disease) (HCC), Coronary artery calcification seen on CT scan, DDD (degenerative disc disease), lumbar, Depression, Dysphagia, History of blood transfusion (2014), History of lung cancer, History of stroke (2015), Idiopathic pulmonary fibrosis (HCC), Migraine, Port-A-Cath in place (12/15/2022), and Spider bite (2012).   Surgical History:   Past Surgical History:  Procedure Laterality Date   ANTERIOR CERVICAL DECOMP/DISCECTOMY FUSION  12/22/2011   Procedure: ANTERIOR CERVICAL DECOMPRESSION/DISCECTOMY FUSION 3 LEVELS;  Surgeon: Venita Lick, MD;  Location: [facility] OR;  Service: Orthopedics;  Laterality: N/A;  ACDF C5-T1   ANTERIOR CERVICAL DECOMP/DISCECTOMY FUSION  06/25/2012   Procedure: ANTERIOR CERVICAL DECOMPRESSION/DISCECTOMY FUSION 1 LEVEL/HARDWARE REMOVAL;  Surgeon: Kerrin Champagne, MD;  Location: [facility] OR;  Service: Orthopedics;  Laterality: N/A;  Removal anterior cervical plate Z6-X0, Explore Fusion, Left C5-6, C6-7, C7-T1 Re-do Foraminotomy, Left open carpal tunnel release with release of ulnar nerve at [facility], Posterior Cervical Fusion with lateral mass screw   ANTERIOR CERVICAL DECOMP/DISCECTOMY FUSION N/A 03/25/2021   Procedure: ANTERIOR CERVICAL DECOMPRESSION/DISCECTOMY FUSION 2 LEVELS  C3-5;  Surgeon: Venita Lick, MD;  Location: [facility] OR;  Service: Orthopedics;  Laterality: N/A;  ANTERIOR CERVICAL DECOMPRESSION/DISCECTOMY FUSION 2 LEVELS  C3-5   BACK SURGERY     CARDIAC CATHETERIZATION     CARPAL TUNNEL RELEASE  06/25/2012   Procedure: CARPAL TUNNEL RELEASE;  Surgeon: Kerrin Champagne, MD;  Location: [facility] OR;  Service: Orthopedics;  Laterality: Left;  as above   CHOLECYSTECTOMY  2009   CORONARY ANGIOPLASTY  2001   [place]   IR IMAGING GUIDED PORT INSERTION  12/14/2022   LUMBAR FUSION  2000   POSTERIOR CERVICAL FUSION/FORAMINOTOMY  06/25/2012   Procedure: POSTERIOR CERVICAL FUSION/FORAMINOTOMY LEVEL 1;  Surgeon: Kerrin Champagne, MD;  Location: [facility] OR;  Service: Orthopedics;   Laterality: N/A;  as above   POSTERIOR CERVICAL FUSION/FORAMINOTOMY N/A 12/17/2013   Procedure: REMOVAL OF POSTERIOR CERVICAL FUSION LATERAL MASS SCREWS AND RODS C5-T1, EXPLORE FUSION, LEFT SIX-SEVEN FORAMINOTOMY;  Surgeon: Kerrin Champagne, MD;  Location: [facility] OR;  Service: Orthopedics;  Laterality: N/A;   VASECTOMY  1990     Social History:   reports that he quit smoking about 7 years ago. His smoking use included cigarettes. He has a 30.00 pack-year smoking history. He has been exposed to tobacco smoke. He has never used smokeless tobacco. He reports that he does not drink alcohol and does not use drugs.   Family History:  His family history includes ALS in his mother and sister; Cancer in his brother, mother, and sister. There is no history of Anesthesia problems.   Allergies Allergies  Allergen Reactions   Pirfenidone Nausea And Vomiting and Other (See Comments)    Weight loss     Home Medications  Prior to Admission medications   Medication Sig Start Date End Date Taking? Authorizing Provider  albuterol (PROVENTIL) (2.5 MG/3ML) 0.083% nebulizer solution Take 3 mLs (2.5 mg total) by nebulization every 6 (six) hours as needed for wheezing or shortness of breath. 11/24/22  Yes Glenford Bayley, NP  albuterol (VENTOLIN HFA) 108 (90 Base) MCG/ACT inhaler Inhale 2 puffs into the lungs every 6 (six) hours as needed for wheezing or  shortness of breath. 11/24/22  Yes Glenford Bayley, NP  benzonatate (TESSALON) 200 MG capsule Take 1 capsule (200 mg total) by mouth 3 (three) times daily as needed. 11/18/22  Yes Hawks, Christy A, FNP  busPIRone (BUSPAR) 5 MG tablet Take 1 tablet (5 mg total) by mouth 3 (three) times daily. 08/31/21  Yes Hawks, Christy A, FNP  CARBOPLATIN IV Inject into the vein every 21 ( twenty-one) days. 12/19/22  Yes [provider]  DULoxetine (CYMBALTA) 60 MG capsule Take 1 capsule (60 mg total) by mouth daily. 09/01/22  Yes Hawks, Christy A, FNP  DURVALUMAB IV Inject  into the vein every 21 ( twenty-one) days. 12/19/22  Yes [provider]  ETOPOSIDE IV Inject into the vein every 21 ( twenty-one) days. 12/19/22  Yes [provider]  famotidine (PEPCID) 20 MG tablet Take 1 tablet (20 mg total) by mouth 2 (two) times daily. 12/20/22  Yes Doreatha Massed, MD  megestrol (MEGACE) 400 MG/10ML suspension Take 10 mLs (400 mg total) by mouth 2 (two) times daily. 12/19/22  Yes Doreatha Massed, MD  methocarbamol (ROBAXIN) 500 MG tablet Take 1 tablet (500 mg total) by mouth 2 (two) times daily as needed for muscle spasms. 02/28/22  Yes Hawks, Christy A, FNP  metoprolol tartrate (LOPRESSOR) 50 MG tablet Take 0.5 tablets (25 mg total) by mouth 2 (two) times daily. 11/11/22  Yes Johnson, Clanford L, MD  Nintedanib (OFEV) 150 MG CAPS Take 1 capsule (150 mg total) by mouth 2 (two) times daily. 11/29/22  Yes Glenford Bayley, NP  oxyCODONE (OXYCONTIN) 20 mg 12 hr tablet Take 1 tablet (20 mg total) by mouth every 12 (twelve) hours. 12/19/22  Yes Doreatha Massed, MD  oxyCODONE (ROXICODONE) 15 MG immediate release tablet Take 15 mg by mouth 4 (four) times daily. 06/22/18  Yes [provider]  prochlorperazine (COMPAZINE) 10 MG tablet Take 1 tablet (10 mg total) by mouth every 6 (six) hours as needed for nausea or vomiting. 12/15/22  Yes Doreatha Massed, MD  traZODone (DESYREL) 50 MG tablet Take 2 tablets (100 mg total) by mouth at bedtime. Patient taking differently: Take 100 mg by mouth at bedtime as needed for sleep. 11/11/22  Yes Johnson, Clanford L, MD  blood glucose meter kit and supplies Dispense based on patient and insurance preference. Use up to four times daily as directed. (FOR ICD-10 E10.9, E11.9). 05/18/21   [name], FNP  Ensure Max Protein (ENSURE MAX PROTEIN) LIQD Take 330 mLs by mouth 3 (three) times daily. 11/18/22   [name], FNP  fexofenadine (ALLEGRA) 180 MG tablet Take 1 tablet (180 mg total) by mouth  daily. Patient not taking: Reported on 12/23/2022 02/28/22   [name] A, FNP  ibuprofen (ADVIL) 800 MG tablet Take 1 tablet (800 mg total) by mouth every 8 (eight) hours as needed. 11/22/22   [name], FNP  lidocaine-prilocaine (EMLA) cream Apply a small amount to port a cath site and cover with plastic wrap 1 hour prior to infusion appointments 12/15/22   Doreatha Massed, MD  magic mouthwash w/lidocaine SOLN Take 10 mLs by mouth 3 (three) times daily as needed for mouth pain. 11/18/22   [name], FNP  Misc. Devices MISC Patient require 6L Nasal Cannula Oxygen 12/13/22   Doreatha Massed, MD  naloxone [facility]) nasal spray 4 mg/0.1 mL Place 1 spray into the nose as needed (opioid overdose).    [provider]  nortriptyline (PAMELOR) 50 MG capsule Take  50 mg by mouth 2 (two) times daily. Patient not taking: Reported on 12/23/2022 11/28/16   [provider]     Critical care time: NA     Simonne Martinet ACNP-BC [facility] Pulmonary/Critical Care Pager # [phone] OR # [phone] if no answer

## 2022-12-23 NOTE — ED Notes (Signed)
Pt repositioned at this time to be made more comfortable also helped with urinal. [name]

## 2022-12-23 NOTE — H&P (Signed)
History and Physical   Patient: Bradley Rice                            PCP: [name], FNP                    DOB: 1960-10-11            DOA: 12/23/2022 ZOX:[id number]             DOS: 12/23/2022, 2:11 PM  [name], FNP  Patient coming from:   HOME  I have personally reviewed patient's medical records, in electronic medical records, including:  [place] link, and care everywhere.    Chief Complaint:   Chief Complaint  Patient presents with   Shortness of Breath    Hx lung ca, increased sob in last 24hrs     History of present illness:    Bradley Rice is a 62 year old male with a history of small cell lung cancer under treatment with chemotherapy with Dr. Ellin Saba, COPD on 6 L oxygen, anxiety, depression, GERD, chronic pain syndrome, ALS (my atrophic lateral sclerosis) dysphagia, pulmonary fibrosis, migraine..  Status post recent port a catheter placement.  On 12/15/2022..  Presented today with chest pain and shortness of breath.  Chest pain progressively getting worse on the left side for past 2 days.  Associated with nonproductive cough. No relief from his current pain medications   Upon evaluation in ED patient was found to have a left-sided pneumothorax  ED course/evaluation: Blood pressure 118/77, pulse (!) 104, resp. rate 16, height 6' (1.829 m), weight 57.6 kg, SpO2 98 %.  6 L of oxygen  CBC BMP within normal limits - WBC of 11.3, chloride 94, glucose 144, troponin 4 BNP 39.0, lactic acid 1.0   Chest x-ray IMPRESSION: 1. Large left pneumothorax. Some rightward mediastinal shift raises concern for tension physiology component. 2. Known masses better characterized on recent CT chest. 3. Additional patchy interstitial and airspace opacities throughout the lungs could represent changes of emphysema but superimposed infection or aspiration is difficult to exclude  Status post successful chest tube placement pigtail by EDP Dr. Charm Barges He also has talked  and informed PCCM at [facility] Dr. Craige Cotta  F/up CXR - IMPRESSION: Left chest tube in place with decreased size of the left pneumothorax, now small.   [facility] to be admitted to [facility] progressive unit, PCCM will be consulted    Patient Denies having: Fever, Chills,Abd pain, N/V/D, headache, dizziness, lightheadedness,  Dysuria, Joint pain, rash, open wounds    Review of Systems: As per HPI, otherwise 10 point review of systems were negative.   ----------------------------------------------------------------------------------------------------------------------  Allergies  Allergen Reactions   Pirfenidone Nausea And Vomiting and Other (See Comments)    Weight loss    Home MEDs:  Prior to Admission medications   Medication Sig Start Date End Date Taking? Authorizing Provider  albuterol (PROVENTIL) (2.5 MG/3ML) 0.083% nebulizer solution Take 3 mLs (2.5 mg total) by nebulization every 6 (six) hours as needed for wheezing or shortness of breath. 11/24/22  Yes Glenford Bayley, NP  albuterol (VENTOLIN HFA) 108 (90 Base) MCG/ACT inhaler Inhale 2 puffs into the lungs every 6 (six) hours as needed for wheezing or shortness of breath. 11/24/22  Yes Glenford Bayley, NP  benzonatate (TESSALON) 200 MG capsule Take 1 capsule (200 mg total) by mouth 3 (three) times daily as needed. 11/18/22  Yes Hawks,  Christy A, FNP  busPIRone (BUSPAR) 5 MG tablet Take 1 tablet (5 mg total) by mouth 3 (three) times daily. 08/31/21  Yes Hawks, Christy A, FNP  CARBOPLATIN IV Inject into the vein every 21 ( twenty-one) days. 12/19/22  Yes [provider]  DULoxetine (CYMBALTA) 60 MG capsule Take 1 capsule (60 mg total) by mouth daily. 09/01/22  Yes Hawks, Christy A, FNP  DURVALUMAB IV Inject into the vein every 21 ( twenty-one) days. 12/19/22  Yes [provider]  ETOPOSIDE IV Inject into the vein every 21 ( twenty-one) days. 12/19/22  Yes [provider]  famotidine (PEPCID) 20 MG tablet  Take 1 tablet (20 mg total) by mouth 2 (two) times daily. 12/20/22  Yes Doreatha Massed, MD  megestrol (MEGACE) 400 MG/10ML suspension Take 10 mLs (400 mg total) by mouth 2 (two) times daily. 12/19/22  Yes Doreatha Massed, MD  methocarbamol (ROBAXIN) 500 MG tablet Take 1 tablet (500 mg total) by mouth 2 (two) times daily as needed for muscle spasms. 02/28/22  Yes Hawks, Christy A, FNP  metoprolol tartrate (LOPRESSOR) 50 MG tablet Take 0.5 tablets (25 mg total) by mouth 2 (two) times daily. 11/11/22  Yes Johnson, Clanford L, MD  Nintedanib (OFEV) 150 MG CAPS Take 1 capsule (150 mg total) by mouth 2 (two) times daily. 11/29/22  Yes Glenford Bayley, NP  oxyCODONE (OXYCONTIN) 20 mg 12 hr tablet Take 1 tablet (20 mg total) by mouth every 12 (twelve) hours. 12/19/22  Yes Doreatha Massed, MD  oxyCODONE (ROXICODONE) 15 MG immediate release tablet Take 15 mg by mouth 4 (four) times daily. 06/22/18  Yes [provider]  prochlorperazine (COMPAZINE) 10 MG tablet Take 1 tablet (10 mg total) by mouth every 6 (six) hours as needed for nausea or vomiting. 12/15/22  Yes Doreatha Massed, MD  traZODone (DESYREL) 50 MG tablet Take 2 tablets (100 mg total) by mouth at bedtime. Patient taking differently: Take 100 mg by mouth at bedtime as needed for sleep. 11/11/22  Yes Johnson, Clanford L, MD  blood glucose meter kit and supplies Dispense based on patient and insurance preference. Use up to four times daily as directed. (FOR ICD-10 E10.9, E11.9). 05/18/21   [name], FNP  Ensure Max Protein (ENSURE MAX PROTEIN) LIQD Take 330 mLs by mouth 3 (three) times daily. 11/18/22   [name], FNP  fexofenadine (ALLEGRA) 180 MG tablet Take 1 tablet (180 mg total) by mouth daily. Patient not taking: Reported on 12/23/2022 02/28/22   [name] A, FNP  ibuprofen (ADVIL) 800 MG tablet Take 1 tablet (800 mg total) by mouth every 8 (eight) hours as needed. 11/22/22   [name], FNP   lidocaine-prilocaine (EMLA) cream Apply a small amount to port a cath site and cover with plastic wrap 1 hour prior to infusion appointments 12/15/22   Doreatha Massed, MD  magic mouthwash w/lidocaine SOLN Take 10 mLs by mouth 3 (three) times daily as needed for mouth pain. 11/18/22   [name], FNP  Misc. Devices MISC Patient require 6L Nasal Cannula Oxygen 12/13/22   Doreatha Massed, MD  naloxone [facility]) nasal spray 4 mg/0.1 mL Place 1 spray into the nose as needed (opioid overdose).    [provider]  nortriptyline (PAMELOR) 50 MG capsule Take 50 mg by mouth 2 (two) times daily. Patient not taking: Reported on 12/23/2022 11/28/16   [provider]    PRN MEDs: sodium chloride, acetaminophen **OR** acetaminophen, benzonatate, bisacodyl, hydrALAZINE, HYDROmorphone (  DILAUDID) injection, ibuprofen, ipratropium, levalbuterol, LORazepam, ondansetron **OR** ondansetron (ZOFRAN) IV, oxyCODONE, senna-docusate, sodium phosphate, zolpidem  Past Medical History:  Diagnosis Date   ALS (amyotrophic lateral sclerosis) (HCC)    Asthma    Back pain    COPD (chronic obstructive pulmonary disease) (HCC)    Coronary artery calcification seen on CT scan    DDD (degenerative disc disease), lumbar    Depression    Dysphagia    Following previous surgery   History of blood transfusion 2014   Post-op bleed   History of lung cancer    History of stroke 2015   Idiopathic pulmonary fibrosis (HCC)    Migraine    Port-A-Cath in place 12/15/2022   Spider bite 2012    Past Surgical History:  Procedure Laterality Date   ANTERIOR CERVICAL DECOMP/DISCECTOMY FUSION  12/22/2011   Procedure: ANTERIOR CERVICAL DECOMPRESSION/DISCECTOMY FUSION 3 LEVELS;  Surgeon: Venita Lick, MD;  Location: [facility];  Service: Orthopedics;  Laterality: N/A;  ACDF C5-T1   ANTERIOR CERVICAL DECOMP/DISCECTOMY FUSION  06/25/2012   Procedure: ANTERIOR CERVICAL DECOMPRESSION/DISCECTOMY FUSION 1 LEVEL/HARDWARE  REMOVAL;  Surgeon: Kerrin Champagne, MD;  Location: [facility];  Service: Orthopedics;  Laterality: N/A;  Removal anterior cervical plate W1-X9, Explore Fusion, Left C5-6, C6-7, C7-T1 Re-do Foraminotomy, Left open carpal tunnel release with release of ulnar nerve at [facility], Posterior Cervical Fusion with lateral mass screw   ANTERIOR CERVICAL DECOMP/DISCECTOMY FUSION N/A 03/25/2021   Procedure: ANTERIOR CERVICAL DECOMPRESSION/DISCECTOMY FUSION 2 LEVELS  C3-5;  Surgeon: Venita Lick, MD;  Location[facility];  Service: Orthopedics;  Laterality: N/A;  ANTERIOR CERVICAL DECOMPRESSION/DISCECTOMY FUSION 2 LEVELS  C3-5   BACK SURGERY     CARDIAC CATHETERIZATION     CARPAL TUNNEL RELEASE  06/25/2012   Procedure: CARPAL TUNNEL RELEASE;  Surgeon: Kerrin Champagne, MD;  [facility];  Service: Orthopedics;  Laterality: Left;  as above   CHOLECYSTECTOMY  2009   CORONARY ANGIOPLASTY  2001   [facility]   IR IMAGING GUIDED PORT INSERTION  12/14/2022   LUMBAR FUSION  2000   POSTERIOR CERVICAL FUSION/FORAMINOTOMY  06/25/2012   Procedure: POSTERIOR CERVICAL FUSION/FORAMINOTOMY LEVEL 1;  Surgeon: Kerrin Champagne, MD;  [facility];  Service: Orthopedics;  Laterality: N/A;  as above   POSTERIOR CERVICAL FUSION/FORAMINOTOMY N/A 12/17/2013   Procedure: REMOVAL OF POSTERIOR CERVICAL FUSION LATERAL MASS SCREWS AND RODS C5-T1, EXPLORE FUSION, LEFT SIX-SEVEN FORAMINOTOMY;  Surgeon: Kerrin Champagne, MD;  Location: [facility];  Service: Orthopedics;  Laterality: N/A;   VASECTOMY  1990     reports that he quit smoking about 7 years ago. His smoking use included cigarettes. He has a 30.00 pack-year smoking history. He has been exposed to tobacco smoke. He has never used smokeless tobacco. He reports that he does not drink alcohol and does not use drugs.   Family History  Problem Relation Age of Onset   ALS Mother    Cancer Mother    Cancer Sister    ALS Sister    Cancer Brother        1 brother   Anesthesia problems Neg Hx      Physical Exam:   Vitals:   12/23/22 1043 12/23/22 1100 12/23/22 1200 12/23/22 1337  BP: (!) 141/100 108/64 118/77   Pulse: (!) 118 (!) 124 (!) 104   Resp: (!) 27 (!) 35 16   SpO2: 95% 93% 98% 97%  Weight: 57.6 kg     Height: 6' (1.829 m)  Constitutional: NAD, calm, comfortable Eyes: PERRL, lids and conjunctivae normal ENMT: Mucous membranes are moist. Posterior pharynx clear of any exudate or lesions.Normal dentition.  Neck: normal, supple, no masses, no thyromegaly Respiratory: Left side/flank pigtail chest tube in place-positive breath sounds bilaterally, ++ wheezing, no crackles. Normal respiratory effort. No accessory muscle use.  Cardiovascular: Regular rate and rhythm, no murmurs / rubs / gallops. No extremity edema. 2+ pedal pulses. No carotid bruits.  Abdomen: no tenderness, no masses palpated. No hepatosplenomegaly. Bowel sounds positive.  Musculoskeletal: no clubbing / cyanosis. No joint deformity upper and lower extremities. Good ROM, no contractures. Normal muscle tone.  Neurologic: CN II-XII grossly intact. Sensation intact, DTR normal. Strength 5/5 in all 4.  Psychiatric: Normal judgment and insight. Alert and oriented x 3. Normal mood.  Skin: no rashes, lesions, ulcers. No induration          Labs on admission:    I have personally reviewed following labs and imaging studies  CBC: Recent Labs  Lab 12/19/22 0756 12/23/22 1021  WBC 10.4 11.3*  NEUTROABS 7.1 9.8*  HGB 12.8* 13.9  HCT 40.8 44.2  MCV 91.9 91.9  PLT 347 314   Basic Metabolic Panel: Recent Labs  Lab 12/19/22 0756 12/23/22 1021 12/23/22 1153  NA 138 136  --   K 3.7 4.0  --   CL 98 94*  --   CO2 31 30  --   GLUCOSE 112* 144*  --   BUN 9 14  --   CREATININE 0.68 0.71  --   CALCIUM 9.5 9.5  --   MG 2.1  --  1.9  PHOS  --   --  3.8   GFR: Estimated Creatinine Clearance: 80 mL/min (by C-G formula based on SCr of 0.71 mg/dL). Liver Function Tests: Recent Labs  Lab  12/19/22 0756 12/23/22 1021  AST 19 25  ALT 16 23  ALKPHOS 65 68  BILITOT 0.4 0.9  PROT 7.9 8.6*  ALBUMIN 3.2* 3.7    Urine analysis:    Component Value Date/Time   COLORURINE STRAW (A) 03/18/2021 1119   APPEARANCEUR CLEAR 03/18/2021 1119   APPEARANCEUR Clear 06/13/2017 1545   LABSPEC 1.003 (L) 03/18/2021 1119   PHURINE 7.0 03/18/2021 1119   GLUCOSEU NEGATIVE 03/18/2021 1119   HGBUR NEGATIVE 03/18/2021 1119   BILIRUBINUR NEGATIVE 03/18/2021 1119   BILIRUBINUR Negative 06/13/2017 1545   KETONESUR NEGATIVE 03/18/2021 1119   PROTEINUR NEGATIVE 03/18/2021 1119   UROBILINOGEN 1.0 12/28/2013 1703   NITRITE NEGATIVE 03/18/2021 1119   LEUKOCYTESUR NEGATIVE 03/18/2021 1119    Last A1C:  Lab Results  Component Value Date   HGBA1C 5.6 05/18/2021     Radiologic Exams on Admission:   DG Chest Port 1 View  Result Date: 12/23/2022 CLINICAL DATA:  Shortness of breath. EXAM: PORTABLE CHEST 1 VIEW COMPARISON:  Chest x-ray Dec 23, 2022. FINDINGS: Left chest tube in place with decreased size of the left pneumothorax, now small. Similar patchy interstitial airspace opacities. No new masses better characterized on recent CT chest. No mediastinal shift. Otherwise, cardiomediastinal silhouette is similar. Left subclavian approach Port-A-Cath with tip in the right atrium. IMPRESSION: Left chest tube in place with decreased size of the left pneumothorax, now small. Electronically Signed   By: Feliberto Harts M.D.   On: 12/23/2022 11:41   DG Chest Port 1 View  Result Date: 12/23/2022 CLINICAL DATA:  sob EXAM: PORTABLE CHEST 1 VIEW COMPARISON:  November 10, 2022. FINDINGS: Large left pneumothorax. Some rightward mediastinal  shift. Otherwise, cardiomediastinal silhouette is similar. Left subclavian approach cardiac rhythm maintenance device with the tip projecting at the right atrium. Patchy interstitial and airspace opacities throughout the lungs. Known masses better characterized on recent CT chest.  IMPRESSION: 1. Large left pneumothorax. Some rightward mediastinal shift raises concern for tension physiology component. 2. Known masses better characterized on recent CT chest. 3. Additional patchy interstitial and airspace opacities throughout the lungs could represent changes of emphysema but superimposed infection or aspiration is difficult to exclude. Findings discussed with Dr. Charm Barges via telephone at 10:26 AM. Electronically Signed   By: Feliberto Harts M.D.   On: 12/23/2022 10:28    EKG:   Independently reviewed.  Orders placed or performed during the hospital encounter of 12/23/22   ED EKG   ED EKG   EKG 12-Lead   ---------------------------------------------------------------------------------------------------------------------------------------    Assessment / Plan:   Principal Problem:   Pneumothorax Active Problems:   Small cell lung cancer, right (HCC)   Port-A-Cath in place   Hypertension   Acute on chronic respiratory failure with hypoxia (HCC)   Depression   Chronic back pain   IPF (idiopathic pulmonary fibrosis) (HCC)   ALS (amyotrophic lateral sclerosis) (HCC)   Anxiety   Assessment and Plan: * Pneumothorax O2 sat 98 %.  6 L of oxygen Chest x-ray IMPRESSION: 1. Large left pneumothorax. Some rightward mediastinal shift raises concern for tension physiology component. 2. Known masses better characterized on recent CT chest. 3. Additional patchy interstitial and airspace opacities throughout the lungs could represent changes of emphysema but superimposed infection or aspiration is difficult to exclude  Status post successful chest tube placement pigtail by EDP Dr. Charm Barges  He also has talked and informed PCCM at [facility] Dr. Craige Cotta  F/up CXR - IMPRESSION: Left chest tube in place with decreased size of the left pneumothorax, now small.  -Chest tube management per PCCM  Port-A-Cath in place - Port placement 12/15/2022 -S/p pneumothorax likely  spontaneous  Small cell lung cancer, right (HCC) Follow-up with Dr. Ellin Saba, under chemotherapy Reviewing and resuming medications accordingly (IV chemotherapy every 21 days)  Acute on chronic respiratory failure with hypoxia (HCC) - With a history of COPD, lung CA small cell, O2 dependent 6-7 L Currently on 6 L satting 98% -Chest tube in place on the left pigtail -Diffuse wheezing -Continue as needed ans scheduled DuoNeb bronchodilator treatments -Initiating IV steroids -Pulmicort nebs  Hypertension - Stable, review home medication will resume metoprolol, -Along with as needed IV hydralazine  Anxiety -Continue as needed Ativan  ALS (amyotrophic lateral sclerosis) (HCC) - Stable follow-up as an outpatient  Chronic back pain -Exacerbated by pneumothorax, continue as needed IV analgesics along with home medication -Continuing long-acting pain medication including oxycodone  Depression - Stable, reviewing and resuming home medication including Cymbalta    Consults called:  PCCM -------------------------------------------------------------------------------------------------------------------------------------------- DVT prophylaxis:  heparin injection 5,000 Units Start: 12/23/22 1400 TED hose Start: 12/23/22 1237 SCDs Start: 12/23/22 1237   Code Status:   Code Status: Full Code   Admission status: Patient will be admitted as Inpatient, with a greater than 2 midnight length of stay. Level of care: Progressive   Family Communication:  none at bedside  (The above findings and plan of care has been discussed with patient in detail, the patient expressed understanding and agreement of above plan)  --------------------------------------------------------------------------------------------------------------------------------------------------  Disposition Plan:  Anticipated 1-2 days Status is: Inpatient Remains inpatient appropriate because: The intervention, chest  tube management, pulmonary critical care input and  evaluation     ----------------------------------------------------------------------------------------------------------------------------------------------------  Time spent:  41  Min.  Of critical time was spent seeing and evaluating the patient, reviewing all medical records, drawing plan of care.  SIGNED: Kendell Bane, MD, FHM. FAAFP. [place], Pager  (Please use [email or web address] to page/ or secure chat through epic) If 7PM-7AM, please contact night-coverage [email or web address],  12/23/2022, 2:11 PM

## 2022-12-23 NOTE — ED Triage Notes (Signed)
Patient in for appointment at cancer center, patient c/o increased sob, back/chest pain over last 24hrs, pt brought to ED for further evaluation

## 2022-12-23 NOTE — Assessment & Plan Note (Signed)
O2 sat 98 %.  6 L of oxygen Chest x-ray IMPRESSION: 1. Large left pneumothorax. Some rightward mediastinal shift raises concern for tension physiology component. 2. Known masses better characterized on recent CT chest. 3. Additional patchy interstitial and airspace opacities throughout the lungs could represent changes of emphysema but superimposed infection or aspiration is difficult to exclude  Status post successful chest tube placement pigtail by EDP Dr. Charm Barges  He also has talked and informed PCCM at [facility] Dr. Craige Cotta  F/up CXR - IMPRESSION: Left chest tube in place with decreased size of the left pneumothorax, now small.  -Chest tube management per PCCM

## 2022-12-23 NOTE — Assessment & Plan Note (Signed)
-   Port placement 12/15/2022 -S/p pneumothorax likely spontaneous

## 2022-12-24 ENCOUNTER — Inpatient Hospital Stay (HOSPITAL_COMMUNITY): Payer: 59

## 2022-12-24 DIAGNOSIS — J9312 Secondary spontaneous pneumothorax: Secondary | ICD-10-CM | POA: Diagnosis not present

## 2022-12-24 LAB — CBC
HCT: 40.2 % (ref 39.0–52.0)
Hemoglobin: 12.5 g/dL — ABNORMAL LOW (ref 13.0–17.0)
MCH: 29.2 pg (ref 26.0–34.0)
MCHC: 31.1 g/dL (ref 30.0–36.0)
MCV: 93.9 fL (ref 80.0–100.0)
Platelets: 287 10*3/uL (ref 150–400)
RBC: 4.28 MIL/uL (ref 4.22–5.81)
RDW: 13.6 % (ref 11.5–15.5)
WBC: 8.8 10*3/uL (ref 4.0–10.5)
nRBC: 0 % (ref 0.0–0.2)

## 2022-12-24 LAB — BASIC METABOLIC PANEL
Anion gap: 9 (ref 5–15)
BUN: 17 mg/dL (ref 6–20)
CO2: 28 mmol/L (ref 22–32)
Calcium: 9.1 mg/dL (ref 8.9–10.3)
Chloride: 99 mmol/L (ref 98–111)
Creatinine, Ser: 0.82 mg/dL (ref 0.61–1.24)
GFR, Estimated: >60 mL/min (ref >60)
Glucose, Bld: 147 mg/dL — ABNORMAL HIGH (ref 70–99)
Potassium: 4.5 mmol/L (ref 3.5–5.1)
Sodium: 136 mmol/L (ref 135–145)

## 2022-12-24 LAB — APTT: aPTT: 32 seconds (ref 24–36)

## 2022-12-24 LAB — GLUCOSE, CAPILLARY: Glucose-Capillary: 221 mg/dL — ABNORMAL HIGH (ref 70–99)

## 2022-12-24 LAB — CULTURE, BLOOD (ROUTINE X 2)

## 2022-12-24 LAB — PROTIME-INR
INR: 1 (ref 0.8–1.2)
Prothrombin Time: 13.2 seconds (ref 11.4–15.2)

## 2022-12-24 MED ORDER — IPRATROPIUM-ALBUTEROL 0.5-2.5 (3) MG/3ML IN SOLN
3.0000 mL | Freq: Three times a day (TID) | RESPIRATORY_TRACT | Status: DC
  Administered 2022-12-24 – 2022-12-26 (×6): 3 mL via RESPIRATORY_TRACT
  Filled 2022-12-24 (×9): qty 3

## 2022-12-24 NOTE — Progress Notes (Signed)
PT Cancellation Note  Patient Details Name: Bradley Rice MRN: [id number] DOB: 1961-06-17   Cancelled Treatment:    Reason Eval/Treat Not Completed: Other (comment); attempted x2, pt eating lunch on first attempt, sleeping soundly on second attempt; allowed pt to sleep and will continue efforts to complete PT eval.  Delice Bison, PT  Acute Rehab Dept [facility]) [phone]  12/24/2022    [facility] 12/24/2022, 4:51 PM

## 2022-12-24 NOTE — Progress Notes (Signed)
PROGRESS NOTE  Bradley Rice BJS:[id number] DOB: 02-Feb-1961 DOA: 12/23/2022 PCP: [name], FNP   LOS: 1 day   Brief Narrative / Interim history: 62 year old male with history of small cell lung cancer currently getting chemotherapy with Dr. 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  He was found to have a large left pneumothorax with rightward mediastinal shift.  He was admitted to the hospital, pulmonary was consulted and he had a chest tube placed  Subjective / 24h Interval events: Complains of back pain, chronic.  No significant chest pain this morning.  Assesement and Plan: Principal Problem:   Pneumothorax Active Problems:   Small cell lung cancer, right (HCC)   Port-A-Cath in place   Hypertension   Acute on chronic respiratory failure with hypoxia (HCC)   Depression   Chronic back pain   IPF (idiopathic pulmonary fibrosis) (HCC)   ALS (amyotrophic lateral sclerosis) (HCC)   Anxiety  Principal problem Left-sided pneumothorax -status post chest tube placement, pulmonary following, management per PCCM team.  Appreciate input.  Active problems Small cell lung cancer -follows with Dr. Kirtland Bouchard, currently getting chemotherapy.  Chronic hypoxic respiratory failure, COPD, pulmonary fibrosis -continue supplemental oxygen.  Currently on 6 L.  He had wheezing on admission and was started on steroids, nebulizers.  Continue for now  Essential hypertension -continue metoprolol  ALS -outpatient follow-up  Chronic back pain -continue home long and short acting medications  Depression, anxiety -continue home medications  Scheduled Meds:  budesonide (PULMICORT) nebulizer solution  0.25 mg Nebulization BID   DULoxetine  60 mg Oral Daily   famotidine  20 mg Oral BID   heparin  5,000 Units Subcutaneous Q8H   ipratropium  0.5 mg Nebulization Q6H   levalbuterol  0.63 mg Nebulization Q6H    megestrol  400 mg Oral BID   methylPREDNISolone (SOLU-MEDROL) injection  80 mg Intravenous Q12H   metoprolol tartrate  25 mg Oral BID   Nintedanib  150 mg Oral BID   oxyCODONE  20 mg Oral Q12H   Ensure Max Protein  330 mL Oral TID   sodium chloride flush  10 mL Intrapleural Q8H   sodium chloride flush  3 mL Intravenous Q12H   Continuous Infusions:  sodium chloride     PRN Meds:.sodium chloride, acetaminophen **OR** acetaminophen, benzonatate, bisacodyl, hydrALAZINE, HYDROmorphone (DILAUDID) injection, ibuprofen, LORazepam, ondansetron **OR** ondansetron (ZOFRAN) IV, oxyCODONE, senna-docusate, sodium phosphate, zolpidem  Current Outpatient Medications  Medication Instructions   albuterol (PROVENTIL) 2.5 mg, Nebulization, Every 6 hours PRN   albuterol (VENTOLIN HFA) 108 (90 Base) MCG/ACT inhaler 2 puffs, Inhalation, Every 6 hours PRN   benzonatate (TESSALON) 200 mg, Oral, 3 times daily PRN   blood glucose meter kit and supplies Dispense based on patient and insurance preference. Use up to four times daily as directed. (FOR ICD-10 E10.9, E11.9).   busPIRone (BUSPAR) 5 mg, Oral, 3 times daily   CARBOPLATIN IV Intravenous, Every 21 days   DULoxetine (CYMBALTA) 60 mg, Oral, Daily   DURVALUMAB IV Intravenous, Every 21 days   Ensure Max Protein (ENSURE MAX PROTEIN) LIQD 330 mLs, Oral, 3 times daily   ETOPOSIDE IV Intravenous, Every 21 days   famotidine (PEPCID) 20 mg, Oral, 2 times daily   fexofenadine (ALLEGRA) 180 mg, Oral, Daily   ibuprofen (ADVIL) 800 mg, Oral, Every 8 hours PRN   lidocaine-prilocaine (EMLA) cream Apply a small amount to port a cath  site and cover with plastic wrap 1 hour prior to infusion appointments   magic mouthwash w/lidocaine SOLN 10 mLs, Oral, 3 times daily PRN   megestrol (MEGACE) 400 mg, Oral, 2 times daily   metoprolol tartrate (LOPRESSOR) 25 mg, Oral, 2 times daily   Misc. Devices MISC Patient require 6L Nasal Cannula Oxygen   naloxone (NARCAN) nasal spray 4  mg/0.1 mL 1 spray, Nasal, As needed   nortriptyline (PAMELOR) 50 mg, 2 times daily   Ofev 150 mg, Oral, 2 times daily   oxyCODONE (OXYCONTIN) 20 mg, Oral, Every 12 hours   oxyCODONE (ROXICODONE) 15 mg, Oral, 4 times daily   prochlorperazine (COMPAZINE) 10 mg, Oral, Every 6 hours PRN   traZODone (DESYREL) 100 mg, Oral, Daily at bedtime    Diet Orders (From admission, onward)     Start     Ordered   12/23/22 1237  Diet regular Room service appropriate? Yes; Fluid consistency: Thin  Diet effective now       Question Answer Comment  Room service appropriate? Yes   Fluid consistency: Thin      12/23/22 1238            DVT prophylaxis: heparin injection 5,000 Units Start: 12/23/22 1400 TED hose Start: 12/23/22 1237 SCDs Start: 12/23/22 1237   Lab Results  Component Value Date   PLT 287 12/24/2022      Code Status: Full Code  Family Communication: no family at bedside   Status is: Inpatient Remains inpatient appropriate because: severity of illness  Level of care: Progressive  Consultants:  Pulmonary   Objective: Vitals:   12/24/22 0218 12/24/22 0657 12/24/22 0830 12/24/22 0907  BP: 100/64 110/78 100/67   Pulse: 89 92 94   Resp: 16 18    Temp: 98 F (36.7 C) 98 F (36.7 C)    TempSrc: Oral Oral    SpO2: 100% 100% 99% 97%  Weight:      Height:        Intake/Output Summary (Last 24 hours) at 12/24/2022 1051 Last data filed at 12/24/2022 0805 Gross per 24 hour  Intake 1080 ml  Output 774 ml  Net 306 ml   Wt Readings from Last 3 Encounters:  12/23/22 57.6 kg  12/19/22 58.9 kg  12/14/22 59.9 kg    Examination:  Constitutional: NAD Eyes: no scleral icterus ENMT: Mucous membranes are moist.  Neck: normal, supple Respiratory: Overall diminished, no wheezing Cardiovascular: Regular rate and rhythm, no murmurs / rubs / gallops.   Abdomen: non distended, no tenderness. Bowel sounds positive.  Musculoskeletal: no clubbing / cyanosis.   Data Reviewed: I  have independently reviewed following labs and imaging studies   CBC Recent Labs  Lab 12/19/22 0756 12/23/22 1021 12/24/22 0353  WBC 10.4 11.3* 8.8  HGB 12.8* 13.9 12.5*  HCT 40.8 44.2 40.2  PLT 347 314 287  MCV 91.9 91.9 93.9  MCH 28.8 28.9 29.2  MCHC 31.4 31.4 31.1  RDW 13.8 13.9 13.6  LYMPHSABS 2.0 1.1  --   MONOABS 1.1* 0.2  --   EOSABS 0.1 0.1  --   BASOSABS 0.1 0.1  --     Recent Labs  Lab 12/19/22 0756 12/23/22 1021 12/23/22 1123 12/23/22 1153 12/24/22 0353  NA 138 136  --   --  136  K 3.7 4.0  --   --  4.5  CL 98 94*  --   --  99  CO2 31 30  --   --  28  GLUCOSE 112* 144*  --   --  147*  BUN 9 14  --   --  17  CREATININE 0.68 0.71  --   --  0.82  CALCIUM 9.5 9.5  --   --  9.1  AST 19 25  --   --   --   ALT 16 23  --   --   --   ALKPHOS 65 68  --   --   --   BILITOT 0.4 0.9  --   --   --   ALBUMIN 3.2* 3.7  --   --   --   MG 2.1  --   --  1.9  --   LATICACIDVEN  --  1.9 1.0  --   --   INR  --   --   --   --  1.0  TSH 0.827  --   --   --   --   BNP  --  39.0  --   --   --     ------------------------------------------------------------------------------------------------------------------ No results for input(s): "CHOL", "HDL", "LDLCALC", "TRIG", "CHOLHDL", "LDLDIRECT" in the last 72 hours.  Lab Results  Component Value Date   HGBA1C 5.6 05/18/2021   ------------------------------------------------------------------------------------------------------------------ No results for input(s): "TSH", "T4TOTAL", "T3FREE", "THYROIDAB" in the last 72 hours.  Invalid input(s): "FREET3"  Cardiac Enzymes No results for input(s): "CKMB", "TROPONINI", "MYOGLOBIN" in the last 168 hours.  Invalid input(s): "CK" ------------------------------------------------------------------------------------------------------------------    Component Value Date/Time   BNP 39.0 12/23/2022 1021    CBG: Recent Labs  Lab 12/24/22 0856  GLUCAP 221*    Recent Results  (from the past 240 hour(s))  Culture, blood (routine x 2)     Status: None (Preliminary result)   Collection Time: 12/23/22 10:20 AM   Specimen: Right Antecubital; Blood  Result Value Ref Range Status   Specimen Description   Final    RIGHT ANTECUBITAL BOTTLES DRAWN AEROBIC AND ANAEROBIC   Special Requests   Final    Blood Culture results may not be optimal due to an excessive volume of blood received in culture bottles   Culture   Final    NO GROWTH < 24 HOURS Performed at [facility], [address] [place], [place] [address]    Report Status PENDING  Incomplete  Culture, blood (routine x 2)     Status: None (Preliminary result)   Collection Time: 12/23/22 10:20 AM   Specimen: Left Antecubital; Blood  Result Value Ref Range Status   Specimen Description   Final    LEFT ANTECUBITAL BOTTLES DRAWN AEROBIC AND ANAEROBIC   Special Requests Blood Culture adequate volume  Final   Culture   Final    NO GROWTH < 24 HOURS Performed at [facility], [address] [place], [place] [address]    Report Status PENDING  Incomplete  SARS Coronavirus 2 by RT PCR (hospital order, performed in [facility] hospital lab) *cepheid single result test* Anterior Nasal Swab     Status: None   Collection Time: 12/23/22 10:32 AM   Specimen: Anterior Nasal Swab  Result Value Ref Range Status   SARS Coronavirus 2 by RT PCR NEGATIVE NEGATIVE Final    Comment: (NOTE) SARS-CoV-2 target nucleic acids are NOT DETECTED.  The SARS-CoV-2 RNA is generally detectable in upper and lower respiratory specimens during the acute phase of infection. The lowest concentration of SARS-CoV-2 viral copies this assay can detect is 250 copies / mL. A negative result  does not preclude SARS-CoV-2 infection and should not be used as the sole basis for treatment or other patient management decisions.  A negative result may occur with improper specimen collection / handling, submission of specimen other than nasopharyngeal swab,  presence of viral mutation(s) within the areas targeted by this assay, and inadequate number of viral copies (<250 copies / mL). A negative result must be combined with clinical observations, patient history, and epidemiological information.  Fact Sheet for Patients:   RoadLapTop.co.za  Fact Sheet for Healthcare Providers: http://kim-miller.com/  This test is not yet approved or  cleared by the [place] FDA and has been authorized for detection and/or diagnosis of SARS-CoV-2 by FDA under an Emergency Use Authorization (EUA).  This EUA will remain in effect (meaning this test can be used) for the duration of the COVID-19 declaration under Section 564(b)(1) of the Act, 21 U.S.C. section 360bbb-3(b)(1), unless the authorization is terminated or revoked sooner.  Performed at [facility], [address], [place], [place] [address]      Radiology Studies: [facility] Chest [facility] 1 View  Result Date: 12/23/2022 CLINICAL DATA:  Shortness of breath. EXAM: PORTABLE CHEST 1 VIEW COMPARISON:  Chest x-ray Dec 23, 2022. FINDINGS: Left chest tube in place with decreased size of the left pneumothorax, now small. Similar patchy interstitial airspace opacities. No new masses better characterized on recent CT chest. No mediastinal shift. Otherwise, cardiomediastinal silhouette is similar. Left subclavian approach Port-A-Cath with tip in the right atrium. IMPRESSION: Left chest tube in place with decreased size of the left pneumothorax, now small. Electronically Signed   By: Feliberto Harts M.D.   On: 12/23/2022 11:41     Pamella Pert, MD, PhD [facility]  Between 7 am - 7 pm I am available, please contact me via Amion (for emergencies) or Securechat (non urgent messages)  Between 7 pm - 7 am I am not available, please contact night coverage MD/APP via Amion

## 2022-12-24 NOTE — Progress Notes (Signed)
NAME:  Bradley Rice, MRN:  [id number], DOB:  [birth year], LOS: 1 ADMISSION DATE:  12/23/2022, CONSULTATION DATE:  5/24 REFERRING MD:  shahmedhi , CHIEF COMPLAINT:  Pneumothorax   History of Present Illness:  62 year old male w/ metastatic lung cancer (bronchogenic carcinoma). In usual state of health until 5/21 when he developed acute onset of chest tightness during chemotherapy. The pt felt is was c/w indigestion and was instructed to take pepcid. On 5/24 her contacted the after hours nurse line at 0630 w/ cc: increased shortness of breath, cough and orthopnea and increased right sided chest pain. He was instructed to go to the ER. CXR showed large left PTX w/ rightward shift in addition to known pulm masses and patchy inertial airspace disease. A small bore chest tube was placed by the EDP and he was admitted to the SDU/steep down unit    Pertinent  Medical History  Extensive stage small cell lung cancer (Right lower lobe bronchogenic carcinoma with lymphadenopathy in the chest, left neck, upper abdomen) s/p cycle 1 carboplatin, etoposide and Durvalumab.  Weight loss (20-30 lbs) Last CT chest: (11/22/2022): Multiple right lower lobe lung masses, bulky right hilar, mediastinal, right greater than left supraclavicular lymph nodes. Fibrotic interstitial lung disease.  Anorexia  Stroke vs brain METS May 2024  Significant Hospital Events: Including procedures, antibiotic start and stop dates in addition to other pertinent events   1/24 admitted w/ spont left PTX [place] cath placed in ER   Interim History / Subjective:  Still SOB but much better than prior to tube placement   Objective   Blood pressure 100/67, pulse 94, temperature 98 F (36.7 C), temperature source Oral, resp. rate 18, height 6' (1.829 m), weight 57.6 kg, SpO2 99 %.        Intake/Output Summary (Last 24 hours) at 12/24/2022 1459 Last data filed at 12/24/2022 1300 Gross per 24 hour  Intake 1320 ml  Output 999 ml  Net 321 ml    Filed Weights   12/23/22 1043  Weight: 57.6 kg    Examinatio General: frail 62 year old male sitting up in bed no distress HENT: temporal wasting no JVD  Lungs: crackles (fine) posterior and bilateral w/ diffuse wheezing  The left chest tube is in place. I do not see airleak  Cardiovascular: RRR Abdomen: soft  Extremities: not tender  Neuro: awake and oriented   Small apical pneumothorax persists  Resolved Hospital Problem list     Assessment & Plan:   Secondary spontaneous pneumothorax: In the setting of structural lung disease, ILD, IPF. -- Chest tube to suction, minimal airleak.  Like to see further improvement chest x-ray prior to going to [facility] -- Flush chest tube per protocol -- Daily chest x-ray while chest tube in place   Chronic hypoxemic respiratory failure: Multifactorial largely related to ILD. -- Continue oxygen supplementation   Wheeze, chest tightness: Possible concomitant reactive airways disease/asthma.  Possible inflammatory response, cancer response to recent chemotherapy. -- Continue IV steroids, bronchodilators   Best Practice (right click and "Reselect all SmartList Selections" daily)   Per primary   Labs   CBC: Recent Labs  Lab 12/19/22 0756 12/23/22 1021 12/24/22 0353  WBC 10.4 11.3* 8.8  NEUTROABS 7.1 9.8*  --   HGB 12.8* 13.9 12.5*  HCT 40.8 44.2 40.2  MCV 91.9 91.9 93.9  PLT 347 314 287     Basic Metabolic Panel: Recent Labs  Lab 12/19/22 0756 12/23/22 1021 12/23/22 1153 12/24/22 0353  NA 138 136  --  136  K 3.7 4.0  --  4.5  CL 98 94*  --  99  CO2 31 30  --  28  GLUCOSE 112* 144*  --  147*  BUN 9 14  --  17  CREATININE 0.68 0.71  --  0.82  CALCIUM 9.5 9.5  --  9.1  MG 2.1  --  1.9  --   PHOS  --   --  3.8  --     GFR: Estimated Creatinine Clearance: 78 mL/min (by C-G formula based on SCr of 0.82 mg/dL). Recent Labs  Lab 12/19/22 0756 12/23/22 1021 12/23/22 1123 12/24/22 0353  WBC 10.4 11.3*  --  8.8   LATICACIDVEN  --  1.9 1.0  --      Liver Function Tests: Recent Labs  Lab 12/19/22 0756 12/23/22 1021  AST 19 25  ALT 16 23  ALKPHOS 65 68  BILITOT 0.4 0.9  PROT 7.9 8.6*  ALBUMIN 3.2* 3.7    No results for input(s): "LIPASE", "AMYLASE" in the last 168 hours. No results for input(s): "AMMONIA" in the last 168 hours.  ABG    Component Value Date/Time   PHART 7.47 (H) 11/08/2022 1751   PCO2ART 44 11/08/2022 1751   PO2ART 71 (L) 11/08/2022 1751   HCO3 31.3 (H) 11/08/2022 1751   TCO2 24 01/24/2018 1940   O2SAT 95.8 11/08/2022 1751     Coagulation Profile: Recent Labs  Lab 12/24/22 0353  INR 1.0    Cardiac Enzymes: No results for input(s): "CKTOTAL", "CKMB", "CKMBINDEX", "TROPONINI" in the last 168 hours.  HbA1C: HB A1C (BAYER DCA - WAIVED)  Date/Time Value Ref Range Status  05/18/2021 11:38 AM 5.6 4.8 - 5.6 % Final    Comment:             Prediabetes: 5.7 - 6.4          Diabetes: >6.4          Glycemic control for adults with diabetes: <7.0               **Please note reference interval change**   02/03/2016 03:40 PM 5.6 <7.0 % Final    Comment:                                          Diabetic Adult            <7.0                                       Healthy Adult        4.3 - 5.7                                                           (DCCT/NGSP) American Diabetes Association's Summary of Glycemic Recommendations for Adults with Diabetes: Hemoglobin A1c <7.0%. More stringent glycemic goals (A1c <6.0%) may further reduce complications at the cost of increased risk of hypoglycemia.     CBG: Recent Labs  Lab 12/24/22 0856  GLUCAP 221*    Review of Systems:    N/a  Past Medical History:  He,  has a past medical history of ALS (amyotrophic lateral sclerosis) (HCC), Asthma, Back pain, COPD (chronic obstructive pulmonary disease) (HCC), Coronary artery calcification seen on CT scan, DDD (degenerative disc disease), lumbar, Depression, Dysphagia,  History of blood transfusion (2014), History of lung cancer, History of stroke (2015), Idiopathic pulmonary fibrosis (HCC), Migraine, Port-A-Cath in place (12/15/2022), and Spider bite (2012).   Surgical History:   Past Surgical History:  Procedure Laterality Date   ANTERIOR CERVICAL DECOMP/DISCECTOMY FUSION  12/22/2011   Procedure: ANTERIOR CERVICAL DECOMPRESSION/DISCECTOMY FUSION 3 LEVELS;  Surgeon: Venita Lick, MD;  Location: [facility] OR;  Service: Orthopedics;  Laterality: N/A;  ACDF C5-T1   ANTERIOR CERVICAL DECOMP/DISCECTOMY FUSION  06/25/2012   Procedure: ANTERIOR CERVICAL DECOMPRESSION/DISCECTOMY FUSION 1 LEVEL/HARDWARE REMOVAL;  Surgeon: Kerrin Champagne, MD;  Location: [facility] OR;  Service: Orthopedics;  Laterality: N/A;  Removal anterior cervical plate W1-X9, Explore Fusion, Left C5-6, C6-7, C7-T1 Re-do Foraminotomy, Left open carpal tunnel release with release of ulnar nerve at [facility], Posterior Cervical Fusion with lateral mass screw   ANTERIOR CERVICAL DECOMP/DISCECTOMY FUSION N/A 03/25/2021   Procedure: ANTERIOR CERVICAL DECOMPRESSION/DISCECTOMY FUSION 2 LEVELS  C3-5;  Surgeon: Venita Lick, MD;  Location: [facility] OR;  Service: Orthopedics;  Laterality: N/A;  ANTERIOR CERVICAL DECOMPRESSION/DISCECTOMY FUSION 2 LEVELS  C3-5   BACK SURGERY     CARDIAC CATHETERIZATION     CARPAL TUNNEL RELEASE  06/25/2012   Procedure: CARPAL TUNNEL RELEASE;  Surgeon: Kerrin Champagne, MD;  Location: [facility] OR;  Service: Orthopedics;  Laterality: Left;  as above   CHOLECYSTECTOMY  2009   CORONARY ANGIOPLASTY  2001   [place]   IR IMAGING GUIDED PORT INSERTION  12/14/2022   LUMBAR FUSION  2000   POSTERIOR CERVICAL FUSION/FORAMINOTOMY  06/25/2012   Procedure: POSTERIOR CERVICAL FUSION/FORAMINOTOMY LEVEL 1;  Surgeon: Kerrin Champagne, MD;  Location: [facility] OR;  Service: Orthopedics;  Laterality: N/A;  as above   POSTERIOR CERVICAL FUSION/FORAMINOTOMY N/A 12/17/2013   Procedure: REMOVAL OF POSTERIOR CERVICAL FUSION LATERAL  MASS SCREWS AND RODS C5-T1, EXPLORE FUSION, LEFT SIX-SEVEN FORAMINOTOMY;  Surgeon: Kerrin Champagne, MD;  Location: [facility] OR;  Service: Orthopedics;  Laterality: N/A;   VASECTOMY  1990     Social History:   reports that he quit smoking about 7 years ago. His smoking use included cigarettes. He has a 30.00 pack-year smoking history. He has been exposed to tobacco smoke. He has never used smokeless tobacco. He reports that he does not drink alcohol and does not use drugs.   Family History:  His family history includes ALS in his mother and sister; Cancer in his brother, mother, and sister. There is no history of Anesthesia problems.   Allergies Allergies  Allergen Reactions   Pirfenidone Nausea And Vomiting and Other (See Comments)    Weight loss     Home Medications  Prior to Admission medications   Medication Sig Start Date End Date Taking? Authorizing Provider  albuterol (PROVENTIL) (2.5 MG/3ML) 0.083% nebulizer solution Take 3 mLs (2.5 mg total) by nebulization every 6 (six) hours as needed for wheezing or shortness of breath. 11/24/22  Yes Glenford Bayley, NP  albuterol (VENTOLIN HFA) 108 (90 Base) MCG/ACT inhaler Inhale 2 puffs into the lungs every 6 (six) hours as needed for wheezing or shortness of breath. 11/24/22  Yes Glenford Bayley, NP  benzonatate (TESSALON) 200 MG capsule Take 1 capsule (200 mg total) by mouth 3 (three) times daily as needed. 11/18/22  Yes Hawks, Christy A, FNP  busPIRone (BUSPAR) 5 MG tablet Take 1 tablet (5 mg total) by mouth 3 (three) times daily. 08/31/21  Yes Hawks, Christy A, FNP  CARBOPLATIN IV Inject into the vein every 21 ( twenty-one) days. 12/19/22  Yes [provider]  DULoxetine (CYMBALTA) 60 MG capsule Take 1 capsule (60 mg total) by mouth daily. 09/01/22  Yes Hawks, Christy A, FNP  DURVALUMAB IV Inject into the vein every 21 ( twenty-one) days. 12/19/22  Yes [provider]  ETOPOSIDE IV Inject into the vein every 21 ( twenty-one) days.  12/19/22  Yes [provider]  famotidine (PEPCID) 20 MG tablet Take 1 tablet (20 mg total) by mouth 2 (two) times daily. 12/20/22  Yes Doreatha Massed, MD  megestrol (MEGACE) 400 MG/10ML suspension Take 10 mLs (400 mg total) by mouth 2 (two) times daily. 12/19/22  Yes Doreatha Massed, MD  methocarbamol (ROBAXIN) 500 MG tablet Take 1 tablet (500 mg total) by mouth 2 (two) times daily as needed for muscle spasms. 02/28/22  Yes Hawks, Christy A, FNP  metoprolol tartrate (LOPRESSOR) 50 MG tablet Take 0.5 tablets (25 mg total) by mouth 2 (two) times daily. 11/11/22  Yes Johnson, Clanford L, MD  Nintedanib (OFEV) 150 MG CAPS Take 1 capsule (150 mg total) by mouth 2 (two) times daily. 11/29/22  Yes Glenford Bayley, NP  oxyCODONE (OXYCONTIN) 20 mg 12 hr tablet Take 1 tablet (20 mg total) by mouth every 12 (twelve) hours. 12/19/22  Yes Doreatha Massed, MD  oxyCODONE (ROXICODONE) 15 MG immediate release tablet Take 15 mg by mouth 4 (four) times daily. 06/22/18  Yes [provider]  prochlorperazine (COMPAZINE) 10 MG tablet Take 1 tablet (10 mg total) by mouth every 6 (six) hours as needed for nausea or vomiting. 12/15/22  Yes Doreatha Massed, MD  traZODone (DESYREL) 50 MG tablet Take 2 tablets (100 mg total) by mouth at bedtime. Patient taking differently: Take 100 mg by mouth at bedtime as needed for sleep. 11/11/22  Yes Johnson, Clanford L, MD  blood glucose meter kit and supplies Dispense based on patient and insurance preference. Use up to four times daily as directed. (FOR ICD-10 E10.9, E11.9). 05/18/21   [name], FNP  Ensure Max Protein (ENSURE MAX PROTEIN) LIQD Take 330 mLs by mouth 3 (three) times daily. 11/18/22   [name], FNP  fexofenadine (ALLEGRA) 180 MG tablet Take 1 tablet (180 mg total) by mouth daily. Patient not taking: Reported on 12/23/2022 02/28/22   [name] A, FNP  ibuprofen (ADVIL) 800 MG tablet Take 1 tablet (800 mg total) by mouth  every 8 (eight) hours as needed. 11/22/22   [name], FNP  lidocaine-prilocaine (EMLA) cream Apply a small amount to port a cath site and cover with plastic wrap 1 hour prior to infusion appointments 12/15/22   Doreatha Massed, MD  magic mouthwash w/lidocaine SOLN Take 10 mLs by mouth 3 (three) times daily as needed for mouth pain. 11/18/22   [name], FNP  Misc. Devices MISC Patient require 6L Nasal Cannula Oxygen 12/13/22   Doreatha Massed, MD  naloxone [facility]) nasal spray 4 mg/0.1 mL Place 1 spray into the nose as needed (opioid overdose).    [provider]  nortriptyline (PAMELOR) 50 MG capsule Take 50 mg by mouth 2 (two) times daily. Patient not taking: Reported on 12/23/2022 11/28/16   [provider]     Critical care time: NA  Karren Burly, MD  [facility] Pulmonary/Critical Care See Amion OR # [phone] if no answer

## 2022-12-25 ENCOUNTER — Inpatient Hospital Stay (HOSPITAL_COMMUNITY): Payer: 59

## 2022-12-25 DIAGNOSIS — J9312 Secondary spontaneous pneumothorax: Secondary | ICD-10-CM | POA: Diagnosis not present

## 2022-12-25 LAB — BASIC METABOLIC PANEL
Anion gap: 6 (ref 5–15)
BUN: 20 mg/dL (ref 6–20)
CO2: 32 mmol/L (ref 22–32)
Calcium: 9.2 mg/dL (ref 8.9–10.3)
Chloride: 98 mmol/L (ref 98–111)
Creatinine, Ser: 0.77 mg/dL (ref 0.61–1.24)
GFR, Estimated: >60 mL/min (ref >60)
Glucose, Bld: 125 mg/dL — ABNORMAL HIGH (ref 70–99)
Potassium: 4.6 mmol/L (ref 3.5–5.1)
Sodium: 136 mmol/L (ref 135–145)

## 2022-12-25 LAB — CBC
HCT: 36.4 % — ABNORMAL LOW (ref 39.0–52.0)
Hemoglobin: 11.2 g/dL — ABNORMAL LOW (ref 13.0–17.0)
MCH: 28.6 pg (ref 26.0–34.0)
MCHC: 30.8 g/dL (ref 30.0–36.0)
MCV: 92.9 fL (ref 80.0–100.0)
Platelets: 273 10*3/uL (ref 150–400)
RBC: 3.92 MIL/uL — ABNORMAL LOW (ref 4.22–5.81)
RDW: 13.3 % (ref 11.5–15.5)
WBC: 13.6 10*3/uL — ABNORMAL HIGH (ref 4.0–10.5)
nRBC: 0 % (ref 0.0–0.2)

## 2022-12-25 LAB — CULTURE, BLOOD (ROUTINE X 2): Culture: NO GROWTH

## 2022-12-25 NOTE — Evaluation (Signed)
Occupational Therapy Evaluation Patient Details Name: Bradley Rice MRN: [id number] DOB: May 29, 1961 Today's Date: 12/25/2022   History of Present Illness Patient is a 62 year old male who presented on 5/24 with right side chest pain, cough and shortness of breath and s/p chest tightness during chemotherapy on 5/21. Patient was admitted with large left pneumothorax with rightward shift. Chest tube was placed. PMH: lung cancer, anorexia, ALS, pulmonary fibrosis, severe COPD,   Clinical Impression   Patient evaluated by Occupational Therapy with no further acute OT needs identified. All education has been completed and the patient has no further questions. Patient reported he was at his baseline and that he only wanted to work on walking in hallway with PT at this time.  See below for any follow-up Occupational Therapy or equipment needs. OT is signing off. Thank you for this referral.       Recommendations for follow up therapy are one component of a multi-disciplinary discharge planning process, led by the attending physician.  Recommendations may be updated based on patient status, additional functional criteria and insurance authorization.   Assistance Recommended at Discharge Frequent or constant Supervision/Assistance  Patient can return home with the following Assistance with cooking/housework;Direct supervision/assist for medications management;Assist for transportation;Help with stairs or ramp for entrance;Direct supervision/assist for financial management    Functional Status Assessment  Patient has not had a recent decline in their functional status  Equipment Recommendations  None recommended by OT       Precautions / Restrictions Precautions Precautions: Fall Precaution Comments: chest tube, 6L/min at baseline Restrictions Weight Bearing Restrictions: No      Mobility Bed Mobility Overal bed mobility: Modified Independent                        Balance Overall  balance assessment: No apparent balance deficits (not formally assessed)               ADL either performed or assessed with clinical judgement   ADL Overall ADL's : At baseline                 General ADL Comments: patient reported he is at his baseline. patietn was able to complete transfer, don sneakers sitting EOB and standign balacne with MI with no AD with O2 noted to drop to 87% on 4L/min with standing tasks. patient reported that at home he has it on 6L/min and it does not drop for him. nursing had preset O2 levels prior to therapist arrival at this time. took less than 30 seconds seated for patient to breath O2 saturation back up to 98% on 4L/min. patient reported wife was coming later and declined any more ADL tasks at this time.  patient reported that he just needed to work on walking to get out of here. OT to defer to PT for walking at this time.     Vision Baseline Vision/History: 1 Wears glasses              Pertinent Vitals/Pain Pain Assessment Pain Assessment: 0-10 Pain Score: 4  Pain Location: low back Pain Descriptors / Indicators: Discomfort Pain Intervention(s): Monitored during session     Hand Dominance Left   Extremity/Trunk Assessment Upper Extremity Assessment Upper Extremity Assessment: Overall WFL for tasks assessed   Lower Extremity Assessment Lower Extremity Assessment: Defer to PT evaluation       Communication Communication Communication: No difficulties   Cognition Arousal/Alertness: Awake/alert Behavior During Therapy: [facility] for tasks  assessed/performed Overall Cognitive Status: Within Functional Limits for tasks assessed                    Home Living Family/patient expects to be discharged to:: Private residence Living Arrangements: Spouse/significant other Available Help at Discharge: Family;Available 24 hours/day Type of Home: House Home Access: Stairs to enter [employer] of Steps: 2 Entrance  Stairs-Rails: Can reach both Home Layout: One level     Bathroom Shower/Tub: Walk-in shower         Home Equipment: Cane - single Librarian, academic (2 wheels);Wheelchair - manual;BSC/3in1          Prior Functioning/Environment Prior Level of Function : Independent/Modified Independent               ADLs Comments: driving                 OT Goals(Current goals can be found in the care plan section) Acute Rehab OT Goals Patient Stated Goal: to go home OT Goal Formulation: All assessment and education complete, DC therapy  OT Frequency:         AM-PAC OT "6 Clicks" Daily Activity     Outcome Measure Help from another person eating meals?: None Help from another person taking care of personal grooming?: None Help from another person toileting, which includes using toliet, bedpan, or urinal?: None Help from another person bathing (including washing, rinsing, drying)?: None Help from another person to put on and taking off regular upper body clothing?: None Help from another person to put on and taking off regular lower body clothing?: None 6 Click Score: 24   End of Session Nurse Communication: Other (comment) (ok to participate in session)  Activity Tolerance: Patient tolerated treatment well Patient left: in bed;with call bell/phone within reach;with bed alarm set  OT Visit Diagnosis: Unsteadiness on feet (R26.81)                Time: 4540-9811 OT Time Calculation (min): 13 min Charges:  OT General Charges $OT Visit: 1 Visit OT Evaluation $OT Eval Low Complexity: 1 Low  Kairi Tufo OTR/L, MS Acute Rehabilitation Department Office# [phone]   Selinda Flavin 12/25/2022, 11:00 AM

## 2022-12-25 NOTE — Evaluation (Signed)
Physical Therapy Evaluation Patient Details Name: Bradley Rice MRN: [id number] DOB: 02/15/1961 Today's Date: 12/25/2022  History of Present Illness  Patient is a 62 year old male who presented on 5/24 with right side chest pain, cough and shortness of breath and s/p chest tightness during chemotherapy on 5/21. Patient was admitted with large left pneumothorax with rightward shift. Chest tube was placed. PMH: lung cancer, anorexia, ALS, pulmonary fibrosis, severe COPD,  Clinical Impression  Patient evaluated by Physical Therapy with no further acute PT needs identified. All education has been completed and the patient has no further questions.   Pt amb in hallway in NAD, minimal DOE;  pt able to manage O2 tank; on 6L HFNC while amb with  SpO2=92-99%, ; returned to 4L at rest with SpO2>92%; no LOB noted;  No DME or f/u needed.   See below for any follow-up Physical Therapy or equipment needs. PT is signing off. Thank you for this referral.        Recommendations for follow up therapy are one component of a multi-disciplinary discharge planning process, led by the attending physician.  Recommendations may be updated based on patient status, additional functional criteria and insurance authorization.  Follow Up Recommendations       Assistance Recommended at Discharge PRN  Patient can return home with the following  Help with stairs or ramp for entrance;Assistance with cooking/housework    Equipment Recommendations None recommended by PT  Recommendations for Other Services       Functional Status Assessment Patient has had a recent decline in their functional status and demonstrates the ability to make significant improvements in function in a reasonable and predictable amount of time.     Precautions / Restrictions Precautions Precautions: Fall Precaution Comments: chest tube, 6L/min at baseline- currently on 4L, incr to 6L with mobility Restrictions Weight Bearing Restrictions: No       Mobility  Bed Mobility               General bed mobility comments: on EOB    Transfers   Equipment used: None Transfers: Sit to/from Stand Sit to Stand: Supervision, Modified independent (Device/Increase time)           General transfer comment: supervision for CT, lines    Ambulation/Gait Ambulation/Gait assistance: Supervision, Modified independent (Device/Increase time) Gait Distance (Feet): 360 Feet Assistive device: None Gait Pattern/deviations: Step-through pattern       General Gait Details: pt able to manage O2 tank; on 6L HFNC while amb with  SpO2=92-99%, minimal DOE; returned to 4L at rest with SpO2>92%; no LOB  Stairs            Wheelchair Mobility    Modified Rankin (Stroke Patients Only)       Balance Overall balance assessment: No apparent balance deficits (not formally assessed)                           High level balance activites: Turns, Head turns High Level Balance Comments: no LOB, pt self manages O2 tank             Pertinent Vitals/Pain Pain Assessment Pain Assessment: No/denies pain    Home Living Family/patient expects to be discharged to:: Private residence Living Arrangements: Spouse/significant other Available Help at Discharge: Family;Available 24 hours/day Type of Home: House Home Access: Stairs to enter Entrance Stairs-Rails: Can reach both Entrance Stairs-Number of Steps: 2   Home Layout: One level Home Equipment:  Cane - single Librarian, academic (2 wheels);Wheelchair - manual;BSC/3in1      Prior Function Prior Level of Function : Independent/Modified Independent               ADLs Comments: driving     Hand Dominance   Dominant Hand: Left    Extremity/Trunk Assessment   Upper Extremity Assessment Upper Extremity Assessment: Overall WFL for tasks assessed;Defer to OT evaluation    Lower Extremity Assessment Lower Extremity Assessment: Overall WFL for tasks  assessed       Communication   Communication: No difficulties  Cognition Arousal/Alertness: Awake/alert Behavior During Therapy: WFL for tasks assessed/performed Overall Cognitive Status: Within Functional Limits for tasks assessed                                          General Comments      Exercises     Assessment/Plan    PT Assessment Patient does not need any further PT services  PT Problem List         PT Treatment Interventions      PT Goals (Current goals can be found in the Care Plan section)  Acute Rehab PT Goals Patient Stated Goal: get out of hospital PT Goal Formulation: All assessment and education complete, DC therapy    Frequency       Co-evaluation               AM-PAC PT "6 Clicks" Mobility  Outcome Measure Help needed turning from your back to your side while in a flat bed without using bedrails?: None Help needed moving from lying on your back to sitting on the side of a flat bed without using bedrails?: None Help needed moving to and from a bed to a chair (including a wheelchair)?: None Help needed standing up from a chair using your arms (e.g., wheelchair or bedside chair)?: None Help needed to walk in hospital room?: None Help needed climbing 3-5 steps with a railing? : A Little 6 Click Score: 23    End of Session   Activity Tolerance: Patient tolerated treatment well Patient left: with call bell/phone within reach;in bed   PT Visit Diagnosis: Other abnormalities of gait and mobility (R26.89)    Time: 1610-9604 PT Time Calculation (min) (ACUTE ONLY): 16 min   Charges:   PT Evaluation $PT Eval Low Complexity: 1 Low          Annya Lizana, PT  Acute Rehab Dept ([facility]) [phone]  12/25/2022   [facility] 12/25/2022, 11:22 AM

## 2022-12-25 NOTE — Telephone Encounter (Signed)
Need a order to send this

## 2022-12-25 NOTE — Progress Notes (Signed)
Handoff to [place], Charity fundraiser. Pt sitting on side of bed in preparation to ambulate. Pt ambulated with assist and with and without Oxygen therapy. Pt sats remain 97-100% while ambulating, noticed after walking and at rest pt desated to 89% on RA and quickly recovered to 90% then finally O2 back on and sats went up to 99% to 100%. Pt tolerated the event without acute distress. SRP RN

## 2022-12-25 NOTE — Progress Notes (Addendum)
NAME:  Bradley Rice, MRN:  [id number], DOB:  10-Jun-1961, LOS: 2 ADMISSION DATE:  12/23/2022, CONSULTATION DATE:  5/24 REFERRING MD:  shahmedhi , CHIEF COMPLAINT:  Pneumothorax   History of Present Illness:  62 year old male w/ metastatic lung cancer (bronchogenic carcinoma). In usual state of health until 5/21 when he developed acute onset of chest tightness during chemotherapy. The pt felt is was c/w indigestion and was instructed to take pepcid. On 5/24 her contacted the after hours nurse line at 0630 w/ cc: increased shortness of breath, cough and orthopnea and increased right sided chest pain. He was instructed to go to the ER. CXR showed large left PTX w/ rightward shift in addition to known pulm masses and patchy inertial airspace disease. A small bore chest tube was placed by the EDP and he was admitted to the SDU/steep down unit    Pertinent  Medical History  Extensive stage small cell lung cancer (Right lower lobe bronchogenic carcinoma with lymphadenopathy in the chest, left neck, upper abdomen) s/p cycle 1 carboplatin, etoposide and Durvalumab.  Weight loss (20-30 lbs) Last CT chest: (11/22/2022): Multiple right lower lobe lung masses, bulky right hilar, mediastinal, right greater than left supraclavicular lymph nodes. Fibrotic interstitial lung disease.  Anorexia  Stroke vs brain METS May 2024  Significant Hospital Events: Including procedures, antibiotic start and stop dates in addition to other pertinent events   1/24 admitted w/ spont left PTX Wayne cath placed in ER   Interim History / Subjective:  Still SOB but much better than prior to tube placement   Objective   Blood pressure 99/68, pulse 92, temperature (!) 97.4 F (36.3 C), temperature source Oral, resp. rate 18, height 6' (1.829 m), weight 57.6 kg, SpO2 100 %.        Intake/Output Summary (Last 24 hours) at 12/25/2022 1217 Last data filed at 12/25/2022 1100 Gross per 24 hour  Intake 730 ml  Output 983 ml  Net  -253 ml    Filed Weights   12/23/22 1043  Weight: 57.6 kg    Examinatio General: frail 62 year old male ambulating around unit without difficulty HENT: temporal wasting no JVD  Lungs: Normal work of breathing The left chest tube is in place. I do not see airleak  Cardiovascular: RRR Abdomen: soft  Extremities: not tender  Neuro: awake and oriented   Small apical pneumothorax persists  Resolved Hospital Problem list     Assessment & Plan:   Secondary spontaneous pneumothorax: In the setting of structural lung disease, ILD, IPF.  Small apical pneumothorax persists without air leak, suspect inability of lung to expand with fibrotic lung disease. -- Chest tube to suction no airleak, no airleak on waterseal, chest x-ray stable, plan to clamp and repeat chest x-ray and if stable consider chest tube removal -- Daily chest x-ray while chest tube in place   Chronic hypoxemic respiratory failure: Multifactorial largely related to ILD. -- Continue oxygen supplementation   Wheeze, chest tightness: Possible concomitant reactive airways disease/asthma.  Possible inflammatory response, cancer response to recent chemotherapy. -- Continue  bronchodilators, steroids discontinued   Best Practice (right click and "Reselect all SmartList Selections" daily)   Per primary   Labs   CBC: Recent Labs  Lab 12/19/22 0756 12/23/22 1021 12/24/22 0353 12/25/22 0350  WBC 10.4 11.3* 8.8 13.6*  NEUTROABS 7.1 9.8*  --   --   HGB 12.8* 13.9 12.5* 11.2*  HCT 40.8 44.2 40.2 36.4*  MCV 91.9 91.9 93.9  92.9  PLT 347 314 287 273     Basic Metabolic Panel: Recent Labs  Lab 12/19/22 0756 12/23/22 1021 12/23/22 1153 12/24/22 0353 12/25/22 0350  NA 138 136  --  136 136  K 3.7 4.0  --  4.5 4.6  CL 98 94*  --  99 98  CO2 31 30  --  28 32  GLUCOSE 112* 144*  --  147* 125*  BUN 9 14  --  17 20  CREATININE 0.68 0.71  --  0.82 0.77  CALCIUM 9.5 9.5  --  9.1 9.2  MG 2.1  --  1.9  --   --    PHOS  --   --  3.8  --   --     GFR: Estimated Creatinine Clearance: 80 mL/min (by C-G formula based on SCr of 0.77 mg/dL). Recent Labs  Lab 12/19/22 0756 12/23/22 1021 12/23/22 1123 12/24/22 0353 12/25/22 0350  WBC 10.4 11.3*  --  8.8 13.6*  LATICACIDVEN  --  1.9 1.0  --   --      Liver Function Tests: Recent Labs  Lab 12/19/22 0756 12/23/22 1021  AST 19 25  ALT 16 23  ALKPHOS 65 68  BILITOT 0.4 0.9  PROT 7.9 8.6*  ALBUMIN 3.2* 3.7    No results for input(s): "LIPASE", "AMYLASE" in the last 168 hours. No results for input(s): "AMMONIA" in the last 168 hours.  ABG    Component Value Date/Time   PHART 7.47 (H) 11/08/2022 1751   PCO2ART 44 11/08/2022 1751   PO2ART 71 (L) 11/08/2022 1751   HCO3 31.3 (H) 11/08/2022 1751   TCO2 24 01/24/2018 1940   O2SAT 95.8 11/08/2022 1751     Coagulation Profile: Recent Labs  Lab 12/24/22 0353  INR 1.0     Cardiac Enzymes: No results for input(s): "CKTOTAL", "CKMB", "CKMBINDEX", "TROPONINI" in the last 168 hours.  HbA1C: HB A1C (BAYER DCA - WAIVED)  Date/Time Value Ref Range Status  05/18/2021 11:38 AM 5.6 4.8 - 5.6 % Final    Comment:             Prediabetes: 5.7 - 6.4          Diabetes: >6.4          Glycemic control for adults with diabetes: <7.0               **Please note reference interval change**   02/03/2016 03:40 PM 5.6 <7.0 % Final    Comment:                                          Diabetic Adult            <7.0                                       Healthy Adult        4.3 - 5.7                                                           (DCCT/NGSP) American Diabetes Association's Summary of Glycemic  Recommendations for Adults with Diabetes: Hemoglobin A1c <7.0%. More stringent glycemic goals (A1c <6.0%) may further reduce complications at the cost of increased risk of hypoglycemia.     CBG: Recent Labs  Lab 12/24/22 0856  GLUCAP 221*     Review of Systems:    N/a  Past Medical  History:  He,  has a past medical history of ALS (amyotrophic lateral sclerosis) (HCC), Asthma, Back pain, COPD (chronic obstructive pulmonary disease) (HCC), Coronary artery calcification seen on CT scan, DDD (degenerative disc disease), lumbar, Depression, Dysphagia, History of blood transfusion (2014), History of lung cancer, History of stroke (2015), Idiopathic pulmonary fibrosis (HCC), Migraine, Port-A-Cath in place (12/15/2022), and Spider bite (2012).   Surgical History:   Past Surgical History:  Procedure Laterality Date   ANTERIOR CERVICAL DECOMP/DISCECTOMY FUSION  12/22/2011   Procedure: ANTERIOR CERVICAL DECOMPRESSION/DISCECTOMY FUSION 3 LEVELS;  Surgeon: Venita Lick, MD;  Location: [facility] OR;  Service: Orthopedics;  Laterality: N/A;  ACDF C5-T1   ANTERIOR CERVICAL DECOMP/DISCECTOMY FUSION  06/25/2012   Procedure: ANTERIOR CERVICAL DECOMPRESSION/DISCECTOMY FUSION 1 LEVEL/HARDWARE REMOVAL;  Surgeon: Kerrin Champagne, MD;  Location: [facility] OR;  Service: Orthopedics;  Laterality: N/A;  Removal anterior cervical plate Z6-X0, Explore Fusion, Left C5-6, C6-7, C7-T1 Re-do Foraminotomy, Left open carpal tunnel release with release of ulnar nerve at [facility], Posterior Cervical Fusion with lateral mass screw   ANTERIOR CERVICAL DECOMP/DISCECTOMY FUSION N/A 03/25/2021   Procedure: ANTERIOR CERVICAL DECOMPRESSION/DISCECTOMY FUSION 2 LEVELS  C3-5;  Surgeon: Venita Lick, MD;  Location: [facility] OR;  Service: Orthopedics;  Laterality: N/A;  ANTERIOR CERVICAL DECOMPRESSION/DISCECTOMY FUSION 2 LEVELS  C3-5   BACK SURGERY     CARDIAC CATHETERIZATION     CARPAL TUNNEL RELEASE  06/25/2012   Procedure: CARPAL TUNNEL RELEASE;  Surgeon: Kerrin Champagne, MD;  Location: [facility] OR;  Service: Orthopedics;  Laterality: Left;  as above   CHOLECYSTECTOMY  2009   CORONARY ANGIOPLASTY  2001   [place]   IR IMAGING GUIDED PORT INSERTION  12/14/2022   LUMBAR FUSION  2000   POSTERIOR CERVICAL FUSION/FORAMINOTOMY  06/25/2012    Procedure: POSTERIOR CERVICAL FUSION/FORAMINOTOMY LEVEL 1;  Surgeon: Kerrin Champagne, MD;  Location: [facility] OR;  Service: Orthopedics;  Laterality: N/A;  as above   POSTERIOR CERVICAL FUSION/FORAMINOTOMY N/A 12/17/2013   Procedure: REMOVAL OF POSTERIOR CERVICAL FUSION LATERAL MASS SCREWS AND RODS C5-T1, EXPLORE FUSION, LEFT SIX-SEVEN FORAMINOTOMY;  Surgeon: Kerrin Champagne, MD;  Location: [facility] OR;  Service: Orthopedics;  Laterality: N/A;   VASECTOMY  1990     Social History:   reports that he quit smoking about 7 years ago. His smoking use included cigarettes. He has a 30.00 pack-year smoking history. He has been exposed to tobacco smoke. He has never used smokeless tobacco. He reports that he does not drink alcohol and does not use drugs.   Family History:  His family history includes ALS in his mother and sister; Cancer in his brother, mother, and sister. There is no history of Anesthesia problems.   Allergies Allergies  Allergen Reactions   Pirfenidone Nausea And Vomiting and Other (See Comments)    Weight loss     Home Medications  Prior to Admission medications   Medication Sig Start Date End Date Taking? Authorizing Provider  albuterol (PROVENTIL) (2.5 MG/3ML) 0.083% nebulizer solution Take 3 mLs (2.5 mg total) by nebulization every 6 (six) hours as needed for wheezing or shortness of breath. 11/24/22  Yes Glenford Bayley, NP  albuterol (VENTOLIN  HFA) 108 (90 Base) MCG/ACT inhaler Inhale 2 puffs into the lungs every 6 (six) hours as needed for wheezing or shortness of breath. 11/24/22  Yes Glenford Bayley, NP  benzonatate (TESSALON) 200 MG capsule Take 1 capsule (200 mg total) by mouth 3 (three) times daily as needed. 11/18/22  Yes Hawks, Christy A, FNP  busPIRone (BUSPAR) 5 MG tablet Take 1 tablet (5 mg total) by mouth 3 (three) times daily. 08/31/21  Yes Hawks, Christy A, FNP  CARBOPLATIN IV Inject into the vein every 21 ( twenty-one) days. 12/19/22  Yes [provider]  DULoxetine  (CYMBALTA) 60 MG capsule Take 1 capsule (60 mg total) by mouth daily. 09/01/22  Yes Hawks, Christy A, FNP  DURVALUMAB IV Inject into the vein every 21 ( twenty-one) days. 12/19/22  Yes [provider]  ETOPOSIDE IV Inject into the vein every 21 ( twenty-one) days. 12/19/22  Yes [provider]  famotidine (PEPCID) 20 MG tablet Take 1 tablet (20 mg total) by mouth 2 (two) times daily. 12/20/22  Yes Doreatha Massed, MD  megestrol (MEGACE) 400 MG/10ML suspension Take 10 mLs (400 mg total) by mouth 2 (two) times daily. 12/19/22  Yes Doreatha Massed, MD  methocarbamol (ROBAXIN) 500 MG tablet Take 1 tablet (500 mg total) by mouth 2 (two) times daily as needed for muscle spasms. 02/28/22  Yes Hawks, Christy A, FNP  metoprolol tartrate (LOPRESSOR) 50 MG tablet Take 0.5 tablets (25 mg total) by mouth 2 (two) times daily. 11/11/22  Yes Johnson, Clanford L, MD  Nintedanib (OFEV) 150 MG CAPS Take 1 capsule (150 mg total) by mouth 2 (two) times daily. 11/29/22  Yes Glenford Bayley, NP  oxyCODONE (OXYCONTIN) 20 mg 12 hr tablet Take 1 tablet (20 mg total) by mouth every 12 (twelve) hours. 12/19/22  Yes Doreatha Massed, MD  oxyCODONE (ROXICODONE) 15 MG immediate release tablet Take 15 mg by mouth 4 (four) times daily. 06/22/18  Yes [provider]  prochlorperazine (COMPAZINE) 10 MG tablet Take 1 tablet (10 mg total) by mouth every 6 (six) hours as needed for nausea or vomiting. 12/15/22  Yes Doreatha Massed, MD  traZODone (DESYREL) 50 MG tablet Take 2 tablets (100 mg total) by mouth at bedtime. Patient taking differently: Take 100 mg by mouth at bedtime as needed for sleep. 11/11/22  Yes Johnson, Clanford L, MD  blood glucose meter kit and supplies Dispense based on patient and insurance preference. Use up to four times daily as directed. (FOR ICD-10 E10.9, E11.9). 05/18/21   [name], FNP  Ensure Max Protein (ENSURE MAX PROTEIN) LIQD Take 330 mLs by mouth 3 (three) times  daily. 11/18/22   [name], FNP  fexofenadine (ALLEGRA) 180 MG tablet Take 1 tablet (180 mg total) by mouth daily. Patient not taking: Reported on 12/23/2022 02/28/22   [name] A, FNP  ibuprofen (ADVIL) 800 MG tablet Take 1 tablet (800 mg total) by mouth every 8 (eight) hours as needed. 11/22/22   [name], FNP  lidocaine-prilocaine (EMLA) cream Apply a small amount to port a cath site and cover with plastic wrap 1 hour prior to infusion appointments 12/15/22   Doreatha Massed, MD  magic mouthwash w/lidocaine SOLN Take 10 mLs by mouth 3 (three) times daily as needed for mouth pain. 11/18/22   [name], FNP  Misc. Devices MISC Patient require 6L Nasal Cannula Oxygen 12/13/22   Doreatha Massed, MD  naloxone [facility]) nasal spray 4 mg/0.1 mL Place 1  spray into the nose as needed (opioid overdose).    [provider]  nortriptyline (PAMELOR) 50 MG capsule Take 50 mg by mouth 2 (two) times daily. Patient not taking: Reported on 12/23/2022 11/28/16   [provider]     Critical care time: NA     Karren Burly, MD  [facility] Pulmonary/Critical Care See Amion OR # [phone] if no answer

## 2022-12-25 NOTE — Plan of Care (Signed)
  Problem: Education: Goal: Knowledge of General Education information will improve Description Including pain rating scale, medication(s)/side effects and non-pharmacologic comfort measures Outcome: Progressing   Problem: Health Behavior/Discharge Planning: Goal: Ability to manage health-related needs will improve Outcome: Progressing   

## 2022-12-25 NOTE — Progress Notes (Signed)
Nurse agree with  completed shift assessment . 

## 2022-12-25 NOTE — Progress Notes (Signed)
Received report from off going nurse.  Agree with assessment.  Administered dilaudid for pain, however, pt stated he felt it leak down his arm.  IV flushes, there was liquid on pt arm but it was difficult to tell how much dilaudid went in and how much leaked out.  RN gave oxy instead.  Will monitor for symptom relief.

## 2022-12-25 NOTE — Progress Notes (Signed)
       Overnight   NAME: NIV LUISI MRN: [id number] DOB : 1961/07/14    Date of Service   12/25/2022   HPI/Events of Note   Inquiry for BP  Bedside visit, determined need for smaller BP cuff due to slight body habitus.   Interventions/ Plan   Smaller BP cuff BP found to be 110 sbp Maintain smaller size cuff      Chinita Greenland BSN MSNA MSN ACNPC-AG Acute Care Nurse Practitioner [facility]

## 2022-12-25 NOTE — Progress Notes (Signed)
PROGRESS NOTE  Bradley Rice UJW:[id number] DOB: 1960/12/06 DOA: 12/23/2022 PCP: [name], FNP   LOS: 2 days   Brief Narrative / Interim history: 62 year old male with history of small cell lung cancer currently getting chemotherapy with Dr. 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  He was found to have a large left pneumothorax with rightward mediastinal shift.  He was admitted to the hospital, pulmonary was consulted and he had a chest tube placed  Subjective / 24h Interval events: Denies any shortness of breath, complains of chronic back pain.  Assesement and Plan: Principal Problem:   Pneumothorax Active Problems:   Small cell lung cancer, right (HCC)   Port-A-Cath in place   Hypertension   Acute on chronic respiratory failure with hypoxia (HCC)   Depression   Chronic back pain   IPF (idiopathic pulmonary fibrosis) (HCC)   ALS (amyotrophic lateral sclerosis) (HCC)   Anxiety  Principal problem Left-sided pneumothorax -status post chest tube placement, pulmonary following, management per PCCM team.  Appreciate input. -Daily chest x-rays  Active problems Small cell lung cancer -follows with Dr. Kirtland Bouchard, currently getting chemotherapy.  Chronic hypoxic respiratory failure, COPD, pulmonary fibrosis -continue supplemental oxygen.  Currently on 6 L.  He had wheezing on admission and was started on steroids, nebulizers.  Continue for now  Essential hypertension -continue metoprolol  ALS -outpatient follow-up  Chronic back pain -continue home long and short acting medications  Depression, anxiety -continue home medications  Scheduled Meds:  budesonide (PULMICORT) nebulizer solution  0.25 mg Nebulization BID   DULoxetine  60 mg Oral Daily   famotidine  20 mg Oral BID   heparin  5,000 Units Subcutaneous Q8H   ipratropium-albuterol  3 mL Nebulization TID   megestrol  400 mg Oral BID    methylPREDNISolone (SOLU-MEDROL) injection  80 mg Intravenous Q12H   metoprolol tartrate  25 mg Oral BID   Nintedanib  150 mg Oral BID   oxyCODONE  20 mg Oral Q12H   Ensure Max Protein  330 mL Oral TID   sodium chloride flush  10 mL Intrapleural Q8H   sodium chloride flush  3 mL Intravenous Q12H   Continuous Infusions:  sodium chloride     PRN Meds:.sodium chloride, acetaminophen **OR** acetaminophen, benzonatate, bisacodyl, hydrALAZINE, HYDROmorphone (DILAUDID) injection, ibuprofen, LORazepam, ondansetron **OR** ondansetron (ZOFRAN) IV, oxyCODONE, senna-docusate, sodium phosphate, zolpidem  Current Outpatient Medications  Medication Instructions   albuterol (PROVENTIL) 2.5 mg, Nebulization, Every 6 hours PRN   albuterol (VENTOLIN HFA) 108 (90 Base) MCG/ACT inhaler 2 puffs, Inhalation, Every 6 hours PRN   benzonatate (TESSALON) 200 mg, Oral, 3 times daily PRN   blood glucose meter kit and supplies Dispense based on patient and insurance preference. Use up to four times daily as directed. (FOR ICD-10 E10.9, E11.9).   busPIRone (BUSPAR) 5 mg, Oral, 3 times daily   CARBOPLATIN IV Intravenous, Every 21 days   DULoxetine (CYMBALTA) 60 mg, Oral, Daily   DURVALUMAB IV Intravenous, Every 21 days   Ensure Max Protein (ENSURE MAX PROTEIN) LIQD 330 mLs, Oral, 3 times daily   ETOPOSIDE IV Intravenous, Every 21 days   famotidine (PEPCID) 20 mg, Oral, 2 times daily   fexofenadine (ALLEGRA) 180 mg, Oral, Daily   ibuprofen (ADVIL) 800 mg, Oral, Every 8 hours PRN   lidocaine-prilocaine (EMLA) cream Apply a small amount to port a cath site and cover with plastic wrap 1  hour prior to infusion appointments   magic mouthwash w/lidocaine SOLN 10 mLs, Oral, 3 times daily PRN   megestrol (MEGACE) 400 mg, Oral, 2 times daily   metoprolol tartrate (LOPRESSOR) 25 mg, Oral, 2 times daily   Misc. Devices MISC Patient require 6L Nasal Cannula Oxygen   naloxone (NARCAN) nasal spray 4 mg/0.1 mL 1 spray, Nasal,  As needed   nortriptyline (PAMELOR) 50 mg, 2 times daily   Ofev 150 mg, Oral, 2 times daily   oxyCODONE (OXYCONTIN) 20 mg, Oral, Every 12 hours   oxyCODONE (ROXICODONE) 15 mg, Oral, 4 times daily   prochlorperazine (COMPAZINE) 10 mg, Oral, Every 6 hours PRN   traZODone (DESYREL) 100 mg, Oral, Daily at bedtime    Diet Orders (From admission, onward)     Start     Ordered   12/23/22 1237  Diet regular Room service appropriate? Yes; Fluid consistency: Thin  Diet effective now       Question Answer Comment  Room service appropriate? Yes   Fluid consistency: Thin      12/23/22 1238            DVT prophylaxis: heparin injection 5,000 Units Start: 12/23/22 1400 TED hose Start: 12/23/22 1237 SCDs Start: 12/23/22 1237   Lab Results  Component Value Date   PLT 273 12/25/2022      Code Status: Full Code  Family Communication: no family at bedside   Status is: Inpatient Remains inpatient appropriate because: severity of illness  Level of care: Progressive  Consultants:  Pulmonary   Objective: Vitals:   12/25/22 0517 12/25/22 0826 12/25/22 0856 12/25/22 0900  BP: 94/60  99/68   Pulse: 89  92   Resp: 18  18 18   Temp: (!) 97.4 F (36.3 C)     TempSrc: Oral     SpO2: 100% 100% 100%   Weight:      Height:        Intake/Output Summary (Last 24 hours) at 12/25/2022 1050 Last data filed at 12/25/2022 0900 Gross per 24 hour  Intake 630 ml  Output 983 ml  Net -353 ml    Wt Readings from Last 3 Encounters:  12/23/22 57.6 kg  12/19/22 58.9 kg  12/14/22 59.9 kg    Examination:  Constitutional: NAD Respiratory: No wheezing Cardiovascular: Regular  Data Reviewed: I have independently reviewed following labs and imaging studies   CBC Recent Labs  Lab 12/19/22 0756 12/23/22 1021 12/24/22 0353 12/25/22 0350  WBC 10.4 11.3* 8.8 13.6*  HGB 12.8* 13.9 12.5* 11.2*  HCT 40.8 44.2 40.2 36.4*  PLT 347 314 287 273  MCV 91.9 91.9 93.9 92.9  MCH 28.8 28.9 29.2  28.6  MCHC 31.4 31.4 31.1 30.8  RDW 13.8 13.9 13.6 13.3  LYMPHSABS 2.0 1.1  --   --   MONOABS 1.1* 0.2  --   --   EOSABS 0.1 0.1  --   --   BASOSABS 0.1 0.1  --   --      Recent Labs  Lab 12/19/22 0756 12/23/22 1021 12/23/22 1123 12/23/22 1153 12/24/22 0353 12/25/22 0350  NA 138 136  --   --  136 136  K 3.7 4.0  --   --  4.5 4.6  CL 98 94*  --   --  99 98  CO2 31 30  --   --  28 32  GLUCOSE 112* 144*  --   --  147* 125*  BUN 9 14  --   --  17 20  CREATININE 0.68 0.71  --   --  0.82 0.77  CALCIUM 9.5 9.5  --   --  9.1 9.2  AST 19 25  --   --   --   --   ALT 16 23  --   --   --   --   ALKPHOS 65 68  --   --   --   --   BILITOT 0.4 0.9  --   --   --   --   ALBUMIN 3.2* 3.7  --   --   --   --   MG 2.1  --   --  1.9  --   --   LATICACIDVEN  --  1.9 1.0  --   --   --   INR  --   --   --   --  1.0  --   TSH 0.827  --   --   --   --   --   BNP  --  39.0  --   --   --   --      ------------------------------------------------------------------------------------------------------------------ No results for input(s): "CHOL", "HDL", "LDLCALC", "TRIG", "CHOLHDL", "LDLDIRECT" in the last 72 hours.  Lab Results  Component Value Date   HGBA1C 5.6 05/18/2021   ------------------------------------------------------------------------------------------------------------------ No results for input(s): "TSH", "T4TOTAL", "T3FREE", "THYROIDAB" in the last 72 hours.  Invalid input(s): "FREET3"  Cardiac Enzymes No results for input(s): "CKMB", "TROPONINI", "MYOGLOBIN" in the last 168 hours.  Invalid input(s): "CK" ------------------------------------------------------------------------------------------------------------------    Component Value Date/Time   BNP 39.0 12/23/2022 1021    CBG: Recent Labs  Lab 12/24/22 0856  GLUCAP 221*     Recent Results (from the past 240 hour(s))  Culture, blood (routine x 2)     Status: None (Preliminary result)   Collection Time:  12/23/22 10:20 AM   Specimen: Right Antecubital; Blood  Result Value Ref Range Status   Specimen Description   Final    RIGHT ANTECUBITAL BOTTLES DRAWN AEROBIC AND ANAEROBIC   Special Requests   Final    Blood Culture results may not be optimal due to an excessive volume of blood received in culture bottles   Culture   Final    NO GROWTH 2 DAYS Performed at [facility], [address], [place], [place] [address]    Report Status PENDING  Incomplete  Culture, blood (routine x 2)     Status: None (Preliminary result)   Collection Time: 12/23/22 10:20 AM   Specimen: Left Antecubital; Blood  Result Value Ref Range Status   Specimen Description   Final    LEFT ANTECUBITAL BOTTLES DRAWN AEROBIC AND ANAEROBIC   Special Requests Blood Culture adequate volume  Final   Culture   Final    NO GROWTH 2 DAYS Performed at [facility], [address] [place], [place] [address]    Report Status PENDING  Incomplete  SARS Coronavirus 2 by RT PCR (hospital order, performed in [facility] hospital lab) *cepheid single result test* Anterior Nasal Swab     Status: None   Collection Time: 12/23/22 10:32 AM   Specimen: Anterior Nasal Swab  Result Value Ref Range Status   SARS Coronavirus 2 by RT PCR NEGATIVE NEGATIVE Final    Comment: (NOTE) SARS-CoV-2 target nucleic acids are NOT DETECTED.  The SARS-CoV-2 RNA is generally detectable in upper and lower respiratory specimens during the acute phase of infection. The lowest concentration of SARS-CoV-2 viral copies this  assay can detect is 250 copies / mL. A negative result does not preclude SARS-CoV-2 infection and should not be used as the sole basis for treatment or other patient management decisions.  A negative result may occur with improper specimen collection / handling, submission of specimen other than nasopharyngeal swab, presence of viral mutation(s) within the areas targeted by this assay, and inadequate number of viral copies (<250 copies /  mL). A negative result must be combined with clinical observations, patient history, and epidemiological information.  Fact Sheet for Patients:   RoadLapTop.co.za  Fact Sheet for Healthcare Providers: http://kim-miller.com/  This test is not yet approved or  cleared by the [place] FDA and has been authorized for detection and/or diagnosis of SARS-CoV-2 by FDA under an Emergency Use Authorization (EUA).  This EUA will remain in effect (meaning this test can be used) for the duration of the COVID-19 declaration under Section 564(b)(1) of the Act, 21 U.S.C. section 360bbb-3(b)(1), unless the authorization is terminated or revoked sooner.  Performed at [facility], [address] [place], [place] [address]      Radiology Studies: DG CHEST PORT 1 VIEW  Result Date: 12/25/2022 CLINICAL DATA:  Chest tube in place, no pneumothorax. EXAM: PORTABLE CHEST 1 VIEW COMPARISON:  Most recent radiograph yesterday. FINDINGS: Unchanged small left apical pneumothorax from yesterday. Left-sided pigtail catheter remains in place. Left chest port in place. The exam is otherwise unchanged with sequela of right lung malignancy and chronic lung disease. IMPRESSION: 1. Unchanged small left apical pneumothorax with left chest tube in place. 2. Unchanged right lung malignancy and chronic lung disease. Electronically Signed   By: Narda Rutherford M.D.   On: 12/25/2022 10:31     Pamella Pert, MD, PhD [facility]  Between 7 am - 7 pm I am available, please contact me via Amion (for emergencies) or Securechat (non urgent messages)  Between 7 pm - 7 am I am not available, please contact night coverage MD/APP via Amion

## 2022-12-25 NOTE — Progress Notes (Signed)
Reviewed serial chest x-rays.  On waterseal unchanged with still small pneumothorax.  No air leak.  Chest was clamped.  Pneumothorax enlarged in size on my review interpretation.  Placed back on suction after discussion with patient.  With fairly large airleak that was not present prior.  It appears orientation of chest tube changed or flipped on most recent chest x-ray.  Possibly new position allowing for better lung reexpansion.  Plan for repeat chest x-ray in the morning.

## 2022-12-26 ENCOUNTER — Inpatient Hospital Stay (HOSPITAL_COMMUNITY): Payer: 59

## 2022-12-26 DIAGNOSIS — J84112 Idiopathic pulmonary fibrosis: Secondary | ICD-10-CM

## 2022-12-26 DIAGNOSIS — J9601 Acute respiratory failure with hypoxia: Secondary | ICD-10-CM

## 2022-12-26 DIAGNOSIS — J9312 Secondary spontaneous pneumothorax: Secondary | ICD-10-CM

## 2022-12-26 LAB — CBC
HCT: 38.7 % — ABNORMAL LOW (ref 39.0–52.0)
Hemoglobin: 12 g/dL — ABNORMAL LOW (ref 13.0–17.0)
MCH: 29.2 pg (ref 26.0–34.0)
MCHC: 31 g/dL (ref 30.0–36.0)
MCV: 94.2 fL (ref 80.0–100.0)
Platelets: 277 10*3/uL (ref 150–400)
RBC: 4.11 MIL/uL — ABNORMAL LOW (ref 4.22–5.81)
RDW: 13.4 % (ref 11.5–15.5)
WBC: 11.6 10*3/uL — ABNORMAL HIGH (ref 4.0–10.5)
nRBC: 0 % (ref 0.0–0.2)

## 2022-12-26 LAB — BASIC METABOLIC PANEL
Anion gap: 6 (ref 5–15)
BUN: 27 mg/dL — ABNORMAL HIGH (ref 6–20)
CO2: 34 mmol/L — ABNORMAL HIGH (ref 22–32)
Calcium: 9.3 mg/dL (ref 8.9–10.3)
Chloride: 99 mmol/L (ref 98–111)
Creatinine, Ser: 0.73 mg/dL (ref 0.61–1.24)
GFR, Estimated: >60 mL/min (ref >60)
Glucose, Bld: 90 mg/dL (ref 70–99)
Potassium: 4.5 mmol/L (ref 3.5–5.1)
Sodium: 139 mmol/L (ref 135–145)

## 2022-12-26 LAB — GLUCOSE, CAPILLARY: Glucose-Capillary: 75 mg/dL (ref 70–99)

## 2022-12-26 MED ORDER — OXYCODONE HCL 5 MG PO TABS
15.0000 mg | ORAL_TABLET | ORAL | Status: DC | PRN
  Administered 2022-12-26 – 2022-12-27 (×5): 15 mg via ORAL
  Filled 2022-12-26 (×5): qty 3

## 2022-12-26 NOTE — Plan of Care (Signed)
  Problem: Education: Goal: Knowledge of General Education information will improve Description: Including pain rating scale, medication(s)/side effects and non-pharmacologic comfort measures Outcome: Progressing   Problem: Health Behavior/Discharge Planning: Goal: Ability to manage health-related needs will improve Outcome: Progressing   Problem: Clinical Measurements: Goal: Will remain free from infection Outcome: Progressing Goal: Cardiovascular complication will be avoided Outcome: Progressing   Problem: Activity: Goal: Risk for activity intolerance will decrease Outcome: Progressing   Problem: Nutrition: Goal: Adequate nutrition will be maintained Outcome: Progressing   Problem: Elimination: Goal: Will not experience complications related to urinary retention Outcome: Progressing   Problem: Safety: Goal: Ability to remain free from injury will improve Outcome: Progressing   Problem: Skin Integrity: Goal: Risk for impaired skin integrity will decrease Outcome: Progressing

## 2022-12-26 NOTE — Progress Notes (Signed)
RT note: Pt. declined aerosol tx.'s at this time, RN made aware, currently at bedside, covering RT to be made aware.

## 2022-12-26 NOTE — Progress Notes (Signed)
NAME:  Bradley Rice, MRN:  [id number], DOB:  08/01/1961, LOS: 3 ADMISSION DATE:  12/23/2022, CONSULTATION DATE:  5/24 REFERRING MD:  shahmedhi , CHIEF COMPLAINT:  Pneumothorax   History of Present Illness:  62 year old male w/ metastatic lung cancer (bronchogenic carcinoma). In usual state of health until 5/21 when he developed acute onset of chest tightness during chemotherapy. The pt felt is was c/w indigestion and was instructed to take pepcid. On 5/24 her contacted the after hours nurse line at 0630 w/ cc: increased shortness of breath, cough and orthopnea and increased right sided chest pain. He was instructed to go to the ER. CXR showed large left PTX w/ rightward shift in addition to known pulm masses and patchy inertial airspace disease. A small bore chest tube was placed by the EDP and he was admitted to the SDU/steep down unit    Pertinent  Medical History  Extensive stage small cell lung cancer (Right lower lobe bronchogenic carcinoma with lymphadenopathy in the chest, left neck, upper abdomen) s/p cycle 1 carboplatin, etoposide and Durvalumab.  Weight loss (20-30 lbs) Last CT chest: (11/22/2022): Multiple right lower lobe lung masses, bulky right hilar, mediastinal, right greater than left supraclavicular lymph nodes. Fibrotic interstitial lung disease.  Anorexia  Stroke vs brain METS May 2024  Significant Hospital Events: Including procedures, antibiotic start and stop dates in addition to other pertinent events   5/24 admitted w/ spont left PTX [place] cath placed in ER  5/26 lung not reexpanded but no leak on suction or waterseal with forced exhalation, failed clamping trial with mall increase in pneumothorax 5/27 now with leak with forced exhalation to suction  Interim History / Subjective:  Now has air leak, failed clamping trial yesterday.  Catheter flipped, slightly repositioned, likely allowing for better reexpansion with new position.  Chest x-ray this morning looks improved  from yesterday, small pneumothorax now tiny in my opinion.  Objective   Blood pressure 123/80, pulse (!) 104, temperature 98.3 F (36.8 C), temperature source Oral, resp. rate 14, height 6' (1.829 m), weight 59.2 kg, SpO2 95 %.        Intake/Output Summary (Last 24 hours) at 12/26/2022 1118 Last data filed at 12/26/2022 4098 Gross per 24 hour  Intake 480 ml  Output 1300 ml  Net -820 ml    Filed Weights   12/23/22 1043 12/26/22 0452  Weight: 57.6 kg 59.2 kg    Examinatio General: frail 62 year old male lying in bed HENT: temporal wasting no JVD  Lungs: Normal work of breathing The left chest tube is in place with significant airleak with forced exhalation Cardiovascular: RRR Abdomen: soft  Extremities: not tender  Neuro: awake and oriented   Small apical pneumothorax persists improved from prior with near resolution, essentially total resolution of the lateral portion  Resolved Hospital Problem list     Assessment & Plan:   Secondary spontaneous pneumothorax: In the setting of structural lung disease, ILD, IPF.  Small apical pneumothorax persists without air leak, suspect inability of lung to expand with fibrotic lung disease. -- Chest tube to suction with air leak to forced exhalation, continue suction, reevaluate for waterseal later in the day -- Daily chest x-ray while chest tube in place   Chronic hypoxemic respiratory failure: Multifactorial largely related to ILD. -- Continue oxygen supplementation   Wheeze, chest tightness: Possible concomitant reactive airways disease/asthma.  Possible inflammatory response, cancer response to recent chemotherapy. -- Continue  bronchodilators, steroids discontinued   Best  Practice (right click and "Reselect all SmartList Selections" daily)   Per primary   Labs   CBC: Recent Labs  Lab 12/23/22 1021 12/24/22 0353 12/25/22 0350 12/26/22 0407  WBC 11.3* 8.8 13.6* 11.6*  NEUTROABS 9.8*  --   --   --   HGB 13.9 12.5*  11.2* 12.0*  HCT 44.2 40.2 36.4* 38.7*  MCV 91.9 93.9 92.9 94.2  PLT 314 287 273 277     Basic Metabolic Panel: Recent Labs  Lab 12/23/22 1021 12/23/22 1153 12/24/22 0353 12/25/22 0350 12/26/22 0407  NA 136  --  136 136 139  K 4.0  --  4.5 4.6 4.5  CL 94*  --  99 98 99  CO2 30  --  28 32 34*  GLUCOSE 144*  --  147* 125* 90  BUN 14  --  17 20 27*  CREATININE 0.71  --  0.82 0.77 0.73  CALCIUM 9.5  --  9.1 9.2 9.3  MG  --  1.9  --   --   --   PHOS  --  3.8  --   --   --     GFR: Estimated Creatinine Clearance: 82.2 mL/min (by C-G formula based on SCr of 0.73 mg/dL). Recent Labs  Lab 12/23/22 1021 12/23/22 1123 12/24/22 0353 12/25/22 0350 12/26/22 0407  WBC 11.3*  --  8.8 13.6* 11.6*  LATICACIDVEN 1.9 1.0  --   --   --      Liver Function Tests: Recent Labs  Lab 12/23/22 1021  AST 25  ALT 23  ALKPHOS 68  BILITOT 0.9  PROT 8.6*  ALBUMIN 3.7    No results for input(s): "LIPASE", "AMYLASE" in the last 168 hours. No results for input(s): "AMMONIA" in the last 168 hours.  ABG    Component Value Date/Time   PHART 7.47 (H) 11/08/2022 1751   PCO2ART 44 11/08/2022 1751   PO2ART 71 (L) 11/08/2022 1751   HCO3 31.3 (H) 11/08/2022 1751   TCO2 24 01/24/2018 1940   O2SAT 95.8 11/08/2022 1751     Coagulation Profile: Recent Labs  Lab 12/24/22 0353  INR 1.0     Cardiac Enzymes: No results for input(s): "CKTOTAL", "CKMB", "CKMBINDEX", "TROPONINI" in the last 168 hours.  HbA1C: HB A1C (BAYER DCA - WAIVED)  Date/Time Value Ref Range Status  05/18/2021 11:38 AM 5.6 4.8 - 5.6 % Final    Comment:             Prediabetes: 5.7 - 6.4          Diabetes: >6.4          Glycemic control for adults with diabetes: <7.0               **Please note reference interval change**   02/03/2016 03:40 PM 5.6 <7.0 % Final    Comment:                                          Diabetic Adult            <7.0                                       Healthy Adult        4.3 -  5.7                                                           (  DCCT/NGSP) American Diabetes Association's Summary of Glycemic Recommendations for Adults with Diabetes: Hemoglobin A1c <7.0%. More stringent glycemic goals (A1c <6.0%) may further reduce complications at the cost of increased risk of hypoglycemia.     CBG: Recent Labs  Lab 12/24/22 0856 12/26/22 0800  GLUCAP 221* 75     Review of Systems:    N/a  Past Medical History:  He,  has a past medical history of ALS (amyotrophic lateral sclerosis) (HCC), Asthma, Back pain, COPD (chronic obstructive pulmonary disease) (HCC), Coronary artery calcification seen on CT scan, DDD (degenerative disc disease), lumbar, Depression, Dysphagia, History of blood transfusion (2014), History of lung cancer, History of stroke (2015), Idiopathic pulmonary fibrosis (HCC), Migraine, Port-A-Cath in place (12/15/2022), and Spider bite (2012).   Surgical History:   Past Surgical History:  Procedure Laterality Date   ANTERIOR CERVICAL DECOMP/DISCECTOMY FUSION  12/22/2011   Procedure: ANTERIOR CERVICAL DECOMPRESSION/DISCECTOMY FUSION 3 LEVELS;  Surgeon: Venita Lick, MD;  Location: [facility] OR;  Service: Orthopedics;  Laterality: N/A;  ACDF C5-T1   ANTERIOR CERVICAL DECOMP/DISCECTOMY FUSION  06/25/2012   Procedure: ANTERIOR CERVICAL DECOMPRESSION/DISCECTOMY FUSION 1 LEVEL/HARDWARE REMOVAL;  Surgeon: Kerrin Champagne, MD;  Location: [facility] OR;  Service: Orthopedics;  Laterality: N/A;  Removal anterior cervical plate W1-X9, Explore Fusion, Left C5-6, C6-7, C7-T1 Re-do Foraminotomy, Left open carpal tunnel release with release of ulnar nerve at [facility], Posterior Cervical Fusion with lateral mass screw   ANTERIOR CERVICAL DECOMP/DISCECTOMY FUSION N/A 03/25/2021   Procedure: ANTERIOR CERVICAL DECOMPRESSION/DISCECTOMY FUSION 2 LEVELS  C3-5;  Surgeon: Venita Lick, MD;  Location: [facility] OR;  Service: Orthopedics;  Laterality: N/A;  ANTERIOR CERVICAL  DECOMPRESSION/DISCECTOMY FUSION 2 LEVELS  C3-5   BACK SURGERY     CARDIAC CATHETERIZATION     CARPAL TUNNEL RELEASE  06/25/2012   Procedure: CARPAL TUNNEL RELEASE;  Surgeon: Kerrin Champagne, MD;  Location: [facility] OR;  Service: Orthopedics;  Laterality: Left;  as above   CHOLECYSTECTOMY  2009   CORONARY ANGIOPLASTY  2001   [place]   IR IMAGING GUIDED PORT INSERTION  12/14/2022   LUMBAR FUSION  2000   POSTERIOR CERVICAL FUSION/FORAMINOTOMY  06/25/2012   Procedure: POSTERIOR CERVICAL FUSION/FORAMINOTOMY LEVEL 1;  Surgeon: Kerrin Champagne, MD;  Location: [facility] OR;  Service: Orthopedics;  Laterality: N/A;  as above   POSTERIOR CERVICAL FUSION/FORAMINOTOMY N/A 12/17/2013   Procedure: REMOVAL OF POSTERIOR CERVICAL FUSION LATERAL MASS SCREWS AND RODS C5-T1, EXPLORE FUSION, LEFT SIX-SEVEN FORAMINOTOMY;  Surgeon: Kerrin Champagne, MD;  Location: [facility] OR;  Service: Orthopedics;  Laterality: N/A;   VASECTOMY  1990     Social History:   reports that he quit smoking about 7 years ago. His smoking use included cigarettes. He has a 30.00 pack-year smoking history. He has been exposed to tobacco smoke. He has never used smokeless tobacco. He reports that he does not drink alcohol and does not use drugs.   Family History:  His family history includes ALS in his mother and sister; Cancer in his brother, mother, and sister. There is no history of Anesthesia problems.   Allergies Allergies  Allergen Reactions   Pirfenidone Nausea And Vomiting and Other (See Comments)    Weight loss     Home Medications  Prior to Admission medications   Medication Sig Start Date End Date Taking? Authorizing Provider  albuterol (PROVENTIL) (2.5 MG/3ML) 0.083% nebulizer solution Take 3 mLs (2.5 mg total) by nebulization every 6 (six) hours as needed for wheezing or shortness of breath. 11/24/22  Yes Glenford Bayley, NP  albuterol (VENTOLIN HFA) 108 (90 Base) MCG/ACT inhaler Inhale 2 puffs into the lungs every 6 (six) hours as needed  for wheezing or shortness of breath. 11/24/22  Yes Glenford Bayley, NP  benzonatate (TESSALON) 200 MG capsule Take 1 capsule (200 mg total) by mouth 3 (three) times daily as needed. 11/18/22  Yes Hawks, Christy A, FNP  busPIRone (BUSPAR) 5 MG tablet Take 1 tablet (5 mg total) by mouth 3 (three) times daily. 08/31/21  Yes Hawks, Christy A, FNP  CARBOPLATIN IV Inject into the vein every 21 ( twenty-one) days. 12/19/22  Yes [provider]  DULoxetine (CYMBALTA) 60 MG capsule Take 1 capsule (60 mg total) by mouth daily. 09/01/22  Yes Hawks, Christy A, FNP  DURVALUMAB IV Inject into the vein every 21 ( twenty-one) days. 12/19/22  Yes [provider]  ETOPOSIDE IV Inject into the vein every 21 ( twenty-one) days. 12/19/22  Yes [provider]  famotidine (PEPCID) 20 MG tablet Take 1 tablet (20 mg total) by mouth 2 (two) times daily. 12/20/22  Yes Doreatha Massed, MD  megestrol (MEGACE) 400 MG/10ML suspension Take 10 mLs (400 mg total) by mouth 2 (two) times daily. 12/19/22  Yes Doreatha Massed, MD  methocarbamol (ROBAXIN) 500 MG tablet Take 1 tablet (500 mg total) by mouth 2 (two) times daily as needed for muscle spasms. 02/28/22  Yes Hawks, Christy A, FNP  metoprolol tartrate (LOPRESSOR) 50 MG tablet Take 0.5 tablets (25 mg total) by mouth 2 (two) times daily. 11/11/22  Yes Johnson, Clanford L, MD  Nintedanib (OFEV) 150 MG CAPS Take 1 capsule (150 mg total) by mouth 2 (two) times daily. 11/29/22  Yes Glenford Bayley, NP  oxyCODONE (OXYCONTIN) 20 mg 12 hr tablet Take 1 tablet (20 mg total) by mouth every 12 (twelve) hours. 12/19/22  Yes Doreatha Massed, MD  oxyCODONE (ROXICODONE) 15 MG immediate release tablet Take 15 mg by mouth 4 (four) times daily. 06/22/18  Yes [provider]  prochlorperazine (COMPAZINE) 10 MG tablet Take 1 tablet (10 mg total) by mouth every 6 (six) hours as needed for nausea or vomiting. 12/15/22  Yes Doreatha Massed, MD  traZODone  (DESYREL) 50 MG tablet Take 2 tablets (100 mg total) by mouth at bedtime. Patient taking differently: Take 100 mg by mouth at bedtime as needed for sleep. 11/11/22  Yes Johnson, Clanford L, MD  blood glucose meter kit and supplies Dispense based on patient and insurance preference. Use up to four times daily as directed. (FOR ICD-10 E10.9, E11.9). 05/18/21   [name], FNP  Ensure Max Protein (ENSURE MAX PROTEIN) LIQD Take 330 mLs by mouth 3 (three) times daily. 11/18/22   [name], FNP  fexofenadine (ALLEGRA) 180 MG tablet Take 1 tablet (180 mg total) by mouth daily. Patient not taking: Reported on 12/23/2022 02/28/22   [name] A, FNP  ibuprofen (ADVIL) 800 MG tablet Take 1 tablet (800 mg total) by mouth every 8 (eight) hours as needed. 11/22/22   [name], FNP  lidocaine-prilocaine (EMLA) cream Apply a small amount to port a cath site and cover with plastic wrap 1 hour prior to infusion appointments 12/15/22   Doreatha Massed, MD  magic mouthwash w/lidocaine SOLN Take 10 mLs by mouth 3 (three) times daily as needed for mouth pain. 11/18/22   [name], FNP  Misc. Devices MISC Patient require 6L Nasal Cannula Oxygen 12/13/22   Doreatha Massed, MD  naloxone (  NARCAN) nasal spray 4 mg/0.1 mL Place 1 spray into the nose as needed (opioid overdose).    [provider]  nortriptyline (PAMELOR) 50 MG capsule Take 50 mg by mouth 2 (two) times daily. Patient not taking: Reported on 12/23/2022 11/28/16   [provider]     Critical care time: NA     Karren Burly, MD  [facility] Pulmonary/Critical Care See Amion OR # [phone] if no answer

## 2022-12-26 NOTE — Progress Notes (Signed)
PROGRESS NOTE  Bradley Rice ZOX:[id number] DOB: [birth year] DOA: 12/23/2022 PCP: [name], FNP   LOS: 3 days   Brief Narrative / Interim history: 62 year old male with history of small cell lung cancer currently getting chemotherapy with Dr. 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  He was found to have a large left pneumothorax with rightward mediastinal shift.  He was admitted to the hospital, pulmonary was consulted and he had a chest tube placed  Subjective / 24h Interval events: Asking about when he can go home.  Still has the chronic back pain  Assesement and Plan: Principal Problem:   Pneumothorax Active Problems:   Small cell lung cancer, right (HCC)   Port-A-Cath in place   Hypertension   Acute on chronic respiratory failure with hypoxia (HCC)   Depression   Chronic back pain   IPF (idiopathic pulmonary fibrosis) (HCC)   ALS (amyotrophic lateral sclerosis) (HCC)   Anxiety  Principal problem Left-sided pneumothorax -status post chest tube placement, pulmonary following, management per PCCM team.  Appreciate input. -Continue daily chest x-ray  Active problems Small cell lung cancer -follows with Dr. Kirtland Bouchard, currently getting chemotherapy.  Chronic hypoxic respiratory failure, COPD, pulmonary fibrosis -continue supplemental oxygen.  Currently on 6 L.  He had wheezing on admission and was started on steroids, nebulizers.  Continue for now  Essential hypertension -continue metoprolol  ALS -outpatient follow-up  Chronic back pain -continue home long and short acting medications  Depression, anxiety -continue home medications  Scheduled Meds:  budesonide (PULMICORT) nebulizer solution  0.25 mg Nebulization BID   DULoxetine  60 mg Oral Daily   famotidine  20 mg Oral BID   heparin  5,000 Units Subcutaneous Q8H   ipratropium-albuterol  3 mL Nebulization TID   megestrol  400  mg Oral BID   metoprolol tartrate  25 mg Oral BID   oxyCODONE  20 mg Oral Q12H   Ensure Max Protein  330 mL Oral TID   sodium chloride flush  10 mL Intrapleural Q8H   sodium chloride flush  3 mL Intravenous Q12H   Continuous Infusions:  sodium chloride     PRN Meds:.sodium chloride, acetaminophen **OR** acetaminophen, benzonatate, bisacodyl, hydrALAZINE, HYDROmorphone (DILAUDID) injection, ibuprofen, LORazepam, ondansetron **OR** ondansetron (ZOFRAN) IV, oxyCODONE, senna-docusate, sodium phosphate, zolpidem  Current Outpatient Medications  Medication Instructions   albuterol (PROVENTIL) 2.5 mg, Nebulization, Every 6 hours PRN   albuterol (VENTOLIN HFA) 108 (90 Base) MCG/ACT inhaler 2 puffs, Inhalation, Every 6 hours PRN   benzonatate (TESSALON) 200 mg, Oral, 3 times daily PRN   blood glucose meter kit and supplies Dispense based on patient and insurance preference. Use up to four times daily as directed. (FOR ICD-10 E10.9, E11.9).   busPIRone (BUSPAR) 5 mg, Oral, 3 times daily   CARBOPLATIN IV Intravenous, Every 21 days   DULoxetine (CYMBALTA) 60 mg, Oral, Daily   DURVALUMAB IV Intravenous, Every 21 days   Ensure Max Protein (ENSURE MAX PROTEIN) LIQD 330 mLs, Oral, 3 times daily   ETOPOSIDE IV Intravenous, Every 21 days   famotidine (PEPCID) 20 mg, Oral, 2 times daily   fexofenadine (ALLEGRA) 180 mg, Oral, Daily   ibuprofen (ADVIL) 800 mg, Oral, Every 8 hours PRN   lidocaine-prilocaine (EMLA) cream Apply a small amount to port a cath site and cover with plastic wrap 1 hour prior to infusion appointments   magic mouthwash w/lidocaine SOLN 10 mLs,  Oral, 3 times daily PRN   megestrol (MEGACE) 400 mg, Oral, 2 times daily   metoprolol tartrate (LOPRESSOR) 25 mg, Oral, 2 times daily   Misc. Devices MISC Patient require 6L Nasal Cannula Oxygen   naloxone (NARCAN) nasal spray 4 mg/0.1 mL 1 spray, Nasal, As needed   nortriptyline (PAMELOR) 50 mg, 2 times daily   Ofev 150 mg, Oral, 2 times  daily   oxyCODONE (OXYCONTIN) 20 mg, Oral, Every 12 hours   oxyCODONE (ROXICODONE) 15 mg, Oral, 4 times daily   prochlorperazine (COMPAZINE) 10 mg, Oral, Every 6 hours PRN   traZODone (DESYREL) 100 mg, Oral, Daily at bedtime    Diet Orders (From admission, onward)     Start     Ordered   12/23/22 1237  Diet regular Room service appropriate? Yes; Fluid consistency: Thin  Diet effective now       Question Answer Comment  Room service appropriate? Yes   Fluid consistency: Thin      12/23/22 1238            DVT prophylaxis: heparin injection 5,000 Units Start: 12/23/22 1400 TED hose Start: 12/23/22 1237 SCDs Start: 12/23/22 1237   Lab Results  Component Value Date   PLT 277 12/26/2022      Code Status: Full Code  Family Communication: Wife present at bedside  Status is: Inpatient Remains inpatient appropriate because: severity of illness  Level of care: Progressive  Consultants:  Pulmonary   Objective: Vitals:   12/26/22 0452 12/26/22 0701 12/26/22 0835 12/26/22 0927  BP:  92/73  123/80  Pulse:    (!) 104  Resp:      Temp:      TempSrc:      SpO2:   95%   Weight: 59.2 kg     Height:        Intake/Output Summary (Last 24 hours) at 12/26/2022 1111 Last data filed at 12/26/2022 1610 Gross per 24 hour  Intake 480 ml  Output 1300 ml  Net -820 ml    Wt Readings from Last 3 Encounters:  12/26/22 59.2 kg  12/19/22 58.9 kg  12/14/22 59.9 kg    Examination:  Constitutional: No distress Respiratory: No wheezing Cardiovascular: Regular rate and rhythm  Data Reviewed: I have independently reviewed following labs and imaging studies   CBC Recent Labs  Lab 12/23/22 1021 12/24/22 0353 12/25/22 0350 12/26/22 0407  WBC 11.3* 8.8 13.6* 11.6*  HGB 13.9 12.5* 11.2* 12.0*  HCT 44.2 40.2 36.4* 38.7*  PLT 314 287 273 277  MCV 91.9 93.9 92.9 94.2  MCH 28.9 29.2 28.6 29.2  MCHC 31.4 31.1 30.8 31.0  RDW 13.9 13.6 13.3 13.4  LYMPHSABS 1.1  --   --   --    MONOABS 0.2  --   --   --   EOSABS 0.1  --   --   --   BASOSABS 0.1  --   --   --      Recent Labs  Lab 12/23/22 1021 12/23/22 1123 12/23/22 1153 12/24/22 0353 12/25/22 0350 12/26/22 0407  NA 136  --   --  136 136 139  K 4.0  --   --  4.5 4.6 4.5  CL 94*  --   --  99 98 99  CO2 30  --   --  28 32 34*  GLUCOSE 144*  --   --  147* 125* 90  BUN 14  --   --  17 20  27*  CREATININE 0.71  --   --  0.82 0.77 0.73  CALCIUM 9.5  --   --  9.1 9.2 9.3  AST 25  --   --   --   --   --   ALT 23  --   --   --   --   --   ALKPHOS 68  --   --   --   --   --   BILITOT 0.9  --   --   --   --   --   ALBUMIN 3.7  --   --   --   --   --   MG  --   --  1.9  --   --   --   LATICACIDVEN 1.9 1.0  --   --   --   --   INR  --   --   --  1.0  --   --   BNP 39.0  --   --   --   --   --      ------------------------------------------------------------------------------------------------------------------ No results for input(s): "CHOL", "HDL", "LDLCALC", "TRIG", "CHOLHDL", "LDLDIRECT" in the last 72 hours.  Lab Results  Component Value Date   HGBA1C 5.6 05/18/2021   ------------------------------------------------------------------------------------------------------------------ No results for input(s): "TSH", "T4TOTAL", "T3FREE", "THYROIDAB" in the last 72 hours.  Invalid input(s): "FREET3"  Cardiac Enzymes No results for input(s): "CKMB", "TROPONINI", "MYOGLOBIN" in the last 168 hours.  Invalid input(s): "CK" ------------------------------------------------------------------------------------------------------------------    Component Value Date/Time   BNP 39.0 12/23/2022 1021    CBG: Recent Labs  Lab 12/24/22 0856 12/26/22 0800  GLUCAP 221* 75     Recent Results (from the past 240 hour(s))  Culture, blood (routine x 2)     Status: None (Preliminary result)   Collection Time: 12/23/22 10:20 AM   Specimen: Right Antecubital; Blood  Result Value Ref Range Status   Specimen  Description   Final    RIGHT ANTECUBITAL BOTTLES DRAWN AEROBIC AND ANAEROBIC   Special Requests   Final    Blood Culture results may not be optimal due to an excessive volume of blood received in culture bottles   Culture   Final    NO GROWTH 2 DAYS Performed at [facility], [address], [place], [place] [address]    Report Status PENDING  Incomplete  Culture, blood (routine x 2)     Status: None (Preliminary result)   Collection Time: 12/23/22 10:20 AM   Specimen: Left Antecubital; Blood  Result Value Ref Range Status   Specimen Description   Final    LEFT ANTECUBITAL BOTTLES DRAWN AEROBIC AND ANAEROBIC   Special Requests Blood Culture adequate volume  Final   Culture   Final    NO GROWTH 2 DAYS Performed at [facility], [address] [place], [place] [address]    Report Status PENDING  Incomplete  SARS Coronavirus 2 by RT PCR (hospital order, performed in [facility] hospital lab) *cepheid single result test* Anterior Nasal Swab     Status: None   Collection Time: 12/23/22 10:32 AM   Specimen: Anterior Nasal Swab  Result Value Ref Range Status   SARS Coronavirus 2 by RT PCR NEGATIVE NEGATIVE Final    Comment: (NOTE) SARS-CoV-2 target nucleic acids are NOT DETECTED.  The SARS-CoV-2 RNA is generally detectable in upper and lower respiratory specimens during the acute phase of infection. The lowest concentration of SARS-CoV-2 viral copies this assay can detect is  250 copies / mL. A negative result does not preclude SARS-CoV-2 infection and should not be used as the sole basis for treatment or other patient management decisions.  A negative result may occur with improper specimen collection / handling, submission of specimen other than nasopharyngeal swab, presence of viral mutation(s) within the areas targeted by this assay, and inadequate number of viral copies (<250 copies / mL). A negative result must be combined with clinical observations, patient history, and  epidemiological information.  Fact Sheet for Patients:   RoadLapTop.co.za  Fact Sheet for Healthcare Providers: http://kim-miller.com/  This test is not yet approved or  cleared by the [place] FDA and has been authorized for detection and/or diagnosis of SARS-CoV-2 by FDA under an Emergency Use Authorization (EUA).  This EUA will remain in effect (meaning this test can be used) for the duration of the COVID-19 declaration under Section 564(b)(1) of the Act, 21 U.S.C. section 360bbb-3(b)(1), unless the authorization is terminated or revoked sooner.  Performed at [facility], [address], [place], [place] [address]      Radiology Studies: DG CHEST PORT 1 VIEW  Result Date: 12/25/2022 CLINICAL DATA:  Chest tube clamped. EXAM: PORTABLE CHEST 1 VIEW COMPARISON:  Earlier today. FINDINGS: Increased size of left pneumothorax, small to moderate, with increasing lateral component. For example lateral component measures 11 mm at the level of the port, previously 4 mm. Pigtail catheter in the left mid lung again seen. The exam is otherwise unchanged IMPRESSION: Increased size of left pneumothorax, small to moderate, with increasing lateral component. Pigtail catheter remains in place. Electronically Signed   By: Narda Rutherford M.D.   On: 12/25/2022 20:49   DG CHEST PORT 1 VIEW  Result Date: 12/25/2022 CLINICAL DATA:  Chest tube in place, on water seal. EXAM: PORTABLE CHEST 1 VIEW COMPARISON:  Earlier today FINDINGS: Slight increased size of left pneumothorax with chest tube on water seal, still small, however now visualized laterally. Pigtail catheter in place unchanged in positioning. Left chest port remains in place. Chronic lung disease and known right lung malignancy are unchanged. IMPRESSION: Slight increased size of left pneumothorax with chest tube on water seal, still small, however now visualized laterally. Left pigtail catheter in place.  Electronically Signed   By: Narda Rutherford M.D.   On: 12/25/2022 20:47     Pamella Pert, MD, PhD [facility]  Between 7 am - 7 pm I am available, please contact me via Amion (for emergencies) or Securechat (non urgent messages)  Between 7 pm - 7 am I am not available, please contact night coverage MD/APP via Amion

## 2022-12-27 ENCOUNTER — Inpatient Hospital Stay (HOSPITAL_COMMUNITY): Payer: 59

## 2022-12-27 ENCOUNTER — Telehealth: Payer: Self-pay | Admitting: Pulmonary Disease

## 2022-12-27 ENCOUNTER — Telehealth: Payer: Self-pay | Admitting: Primary Care

## 2022-12-27 DIAGNOSIS — J9383 Other pneumothorax: Secondary | ICD-10-CM

## 2022-12-27 DIAGNOSIS — J9312 Secondary spontaneous pneumothorax: Secondary | ICD-10-CM | POA: Diagnosis not present

## 2022-12-27 DIAGNOSIS — J939 Pneumothorax, unspecified: Secondary | ICD-10-CM

## 2022-12-27 LAB — BASIC METABOLIC PANEL
Anion gap: 6 (ref 5–15)
BUN: 26 mg/dL — ABNORMAL HIGH (ref 6–20)
CO2: 32 mmol/L (ref 22–32)
Calcium: 9.1 mg/dL (ref 8.9–10.3)
Chloride: 96 mmol/L — ABNORMAL LOW (ref 98–111)
Creatinine, Ser: 0.71 mg/dL (ref 0.61–1.24)
GFR, Estimated: >60 mL/min (ref >60)
Glucose, Bld: 102 mg/dL — ABNORMAL HIGH (ref 70–99)
Potassium: 4.4 mmol/L (ref 3.5–5.1)
Sodium: 134 mmol/L — ABNORMAL LOW (ref 135–145)

## 2022-12-27 LAB — CBC
HCT: 38.1 % — ABNORMAL LOW (ref 39.0–52.0)
Hemoglobin: 11.8 g/dL — ABNORMAL LOW (ref 13.0–17.0)
MCH: 29 pg (ref 26.0–34.0)
MCHC: 31 g/dL (ref 30.0–36.0)
MCV: 93.6 fL (ref 80.0–100.0)
Platelets: 220 10*3/uL (ref 150–400)
RBC: 4.07 MIL/uL — ABNORMAL LOW (ref 4.22–5.81)
RDW: 13.5 % (ref 11.5–15.5)
WBC: 10.9 10*3/uL — ABNORMAL HIGH (ref 4.0–10.5)
nRBC: 0 % (ref 0.0–0.2)

## 2022-12-27 LAB — CULTURE, BLOOD (ROUTINE X 2): Culture: NO GROWTH

## 2022-12-27 LAB — GLUCOSE, CAPILLARY: Glucose-Capillary: 132 mg/dL — ABNORMAL HIGH (ref 70–99)

## 2022-12-27 NOTE — TOC Transition Note (Signed)
Transition of Care [facility]) - CM/SW Discharge Note   Patient Details  Name: Bradley Rice MRN: [id number] Date of Birth: 1960/11/16  [facility]) CM/SW Contact:  Larrie Kass, LCSW Phone Number: 12/27/2022, 2:27 PM   Clinical Narrative:     CSW spoke with pt regarding home O2 rec , he reports getting his home O2 from [place]. New O2 orders faxed out to [phone]. Pt reports no transportation needs. No additional TOC needs TOC sign off.          Patient Goals and CMS Choice      Discharge Placement                         Discharge Plan and Services Additional resources added to the After Visit Summary for                                       Social Determinants of Health (SDOH) Interventions SDOH Screenings   Food Insecurity: No Food Insecurity (12/23/2022)  Housing: Low Risk  (12/23/2022)  Transportation Needs: No Transportation Needs (12/23/2022)  Utilities: Not At Risk (12/23/2022)  Depression (PHQ2-9): Low Risk  (11/18/2022)  Recent Concern: Depression (PHQ2-9) - Medium Risk (09/01/2022)  Financial Resource Strain: Low Risk  (11/18/2022)  Physical Activity: Unknown (11/18/2022)  Social Connections: Unknown (11/18/2022)  Stress: Stress Concern Present (11/18/2022)  Tobacco Use: Medium Risk (12/23/2022)     Readmission Risk Interventions     No data to display

## 2022-12-27 NOTE — Progress Notes (Signed)
Pt refused breathing tx's at this time, RN at bedside and aware

## 2022-12-27 NOTE — Progress Notes (Signed)
   NAME:  Bradley Rice, MRN:  [id number], DOB:  Mar 22, 1961, LOS: 4 ADMISSION DATE:  12/23/2022, CONSULTATION DATE:  5/24 REFERRING MD:  shahmedhi , CHIEF COMPLAINT:  Pneumothorax   History of Present Illness:  62 year old male w/ metastatic lung cancer (bronchogenic carcinoma). In usual state of health until 5/21 when he developed acute onset of chest tightness during chemotherapy. The pt felt is was c/w indigestion and was instructed to take pepcid. On 5/24 her contacted the after hours nurse line at 0630 w/ cc: increased shortness of breath, cough and orthopnea and increased right sided chest pain. He was instructed to go to the ER. CXR showed large left PTX w/ rightward shift in addition to known pulm masses and patchy inertial airspace disease. A small bore chest tube was placed by the EDP and he was admitted to the SDU/steep down unit    Pertinent  Medical History  Extensive stage small cell lung cancer (Right lower lobe bronchogenic carcinoma with lymphadenopathy in the chest, left neck, upper abdomen) s/p cycle 1 carboplatin, etoposide and Durvalumab.  Weight loss (20-30 lbs) Last CT chest: (11/22/2022): Multiple right lower lobe lung masses, bulky right hilar, mediastinal, right greater than left supraclavicular lymph nodes. Fibrotic interstitial lung disease.  Anorexia  Stroke vs brain METS May 2024  Significant Hospital Events: Including procedures, antibiotic start and stop dates in addition to other pertinent events   5/24 admitted w/ spont left PTX [place] cath placed in ER  5/26 lung not reexpanded but no leak on suction or waterseal with forced exhalation, failed clamping trial with mall increase in pneumothorax 5/27 now with leak with forced exhalation to suction  Interim History / Subjective:  Wants to go home.  Objective   Blood pressure 115/80, pulse 86, temperature 98.1 F (36.7 C), temperature source Oral, resp. rate 15, height 6' (1.829 m), weight 59.5 kg, SpO2 99 %.         Intake/Output Summary (Last 24 hours) at 12/27/2022 0932 Last data filed at 12/27/2022 0841 Gross per 24 hour  Intake 953 ml  Output 2325 ml  Net -1372 ml    Filed Weights   12/23/22 1043 12/26/22 0452 12/27/22 0452  Weight: 57.6 kg 59.2 kg 59.5 kg    Examinatio No distress Reduced breath sounds on L No air leak even with cough Chest tube site CDI Port in place  CXR this am reviewed: minimal remaining PTX at apex  Resolved Hospital Problem list     Assessment & Plan:   Secondary spontaneous pneumothorax: In the setting of structural lung disease, ILD, IPF, contralateral lung cancer.  Small apical pneumothorax persists without air leak, suspect inability of lung to expand with fibrotic lung disease. - Clamp pigtail - Repeat CXR 1300, if lung is up dc chest tube - If fails clamping trial will send to [facility] for valve evaluation, d/w Dr. Cliffton Asters - Will follow    Lorin Glass, MD  [facility] Pulmonary/Critical Care See Amion OR # [phone] if no answer

## 2022-12-27 NOTE — Progress Notes (Signed)
12/27/2022  PTX looks a little worse with clamping trial; however, existing tube has no air leak on unclamping nor are symptoms changed.  Cannot do endobronchial valves with no air leak nor can we do talc since there is not great apposition of the lung.  After discussing risks and benefits patient wants to try to be conservative with close OP f/u.  Will arrange f/u in clinic this week.  He lives across the [address] from [facility].  Discussed with primary.  Patient informed he needs to go to ER right away if any increased SOB (his original issues).  Should he get readmitted with this PTX, please send to [facility] so we can try endobronchial valves.  Myrla Halsted MD PCCM

## 2022-12-27 NOTE — Plan of Care (Signed)
PCCM:  Chest tube clamp trial beginning at 0902; stopcock turned off to patient. RN, Hospital doctor, made aware. Advised to contact us if patient becomes SOB or tachypneic.  Will repeat CXR 1300.  Tim Lair, PA-C [facility] 12/27/22 9:20 AM  Please see [email or web address] for pager details.  From 7A-7P if no response, please call [phone] After hours, please call ELink [phone]

## 2022-12-27 NOTE — Telephone Encounter (Addendum)
Spoke with Erskine Squibb at [facility] radiology Call report on CXR from 12/27/22 at 1:20 pm   Impression:  IMPRESSION: Slight increase in the left-sided pneumothorax when compared with the prior exam. The remainder of the exam is stable from the prior study.   I sent secure chat to Dr Katrinka Blazing to make him aware  He has reviewed cxr

## 2022-12-27 NOTE — Plan of Care (Signed)

## 2022-12-27 NOTE — Discharge Summary (Signed)
Physician Discharge Summary  Bradley MICHAELSEN WUJ:[id number] DOB: [birth year] DOA: 12/23/2022  PCP: [name], FNP  Admit date: 12/23/2022 Discharge date: 12/27/2022  Admitted From: home Disposition:  home  Recommendations for Outpatient Follow-up:  Follow up with PCP in 1-2 weeks High chance of pneumothorax reexpansion and rehospitalization  Home Health: none Equipment/Devices: home O2, 6L, chronic  Discharge Condition: stable CODE STATUS: Full code  HPI: Per admitting MD, Bradley LECHTENBERG is a 62 year old male with a history of small cell lung cancer under treatment with chemotherapy with Dr. Ellin Saba, COPD on 6 L oxygen, anxiety, depression, GERD, chronic pain syndrome, ALS (my atrophic lateral sclerosis) dysphagia, pulmonary fibrosis, migraine..  Status post recent port a catheter placement.  On 12/15/2022..  Presented today with chest pain and shortness of breath.  Chest pain progressively getting worse on the left side for past 2 days.  Associated with nonproductive cough. No relief from his current pain medications  Hospital Course / Discharge diagnoses: Principal Problem:   Pneumothorax Active Problems:   Small cell lung cancer, right (HCC)   Port-A-Cath in place   Hypertension   Acute on chronic respiratory failure with hypoxia (HCC)   Depression   Chronic back pain   IPF (idiopathic pulmonary fibrosis) (HCC)   ALS (amyotrophic lateral sclerosis) (HCC)   Anxiety  Principal problem Left-sided pneumothorax -pulmonary consulted and evaluated and followed patient while hospitalized.  He underwent the chest tube placement and serial chest x-ray.  Underwent 2 clamping trials which showed slight recommendation of pneumothorax.  On 5/28 the pneumothorax looked a little worse with clamping trial, however the existing tube had no air leak on unclamping and patient clinically is improved.  Technically is not possible to do an endobronchial valve without air leak or talc given no  good apposition of the lung per PCCM.  Dr. Katrinka Blazing discussed with patient risk/benefits, patient would like to go with conservative management for now, go home and if his symptoms recur he will present back to the hospital.  Should he present with pneumothorax he would need to be admitted to [facility] for cardiothoracic surgery consult   Active problems Small cell lung cancer -follows with Dr. Kirtland Bouchard, currently getting chemotherapy. Chronic hypoxic respiratory failure, COPD, pulmonary fibrosis -continue supplemental oxygen.  Currently on 6 L.  Back to baseline Essential hypertension -continue metoprolol ALS -outpatient follow-up Chronic back pain -continue home long and short acting medications Depression, anxiety -continue home medications  Sepsis ruled out   Discharge Instructions   Allergies as of 12/27/2022       Reactions   Pirfenidone Nausea And Vomiting, Other (See Comments)   Weight loss        Medication List     TAKE these medications    albuterol (2.5 MG/3ML) 0.083% nebulizer solution Commonly known as: PROVENTIL Take 3 mLs (2.5 mg total) by nebulization every 6 (six) hours as needed for wheezing or shortness of breath.   albuterol 108 (90 Base) MCG/ACT inhaler Commonly known as: VENTOLIN HFA Inhale 2 puffs into the lungs every 6 (six) hours as needed for wheezing or shortness of breath.   benzonatate 200 MG capsule Commonly known as: TESSALON Take 1 capsule (200 mg total) by mouth 3 (three) times daily as needed. What changed: reasons to take this   blood glucose meter kit and supplies Dispense based on patient and insurance preference. Use up to four times daily as directed. (FOR ICD-10 E10.9, E11.9).   busPIRone 5 MG tablet  Commonly known as: BUSPAR Take 1 tablet (5 mg total) by mouth 3 (three) times daily.   CARBOPLATIN IV Inject into the vein every 21 ( twenty-one) days.   DULoxetine 60 MG capsule Commonly known as: Cymbalta Take 1 capsule (60  mg total) by mouth daily.   DURVALUMAB IV Inject into the vein every 21 ( twenty-one) days.   Ensure Max Protein Liqd Take 330 mLs by mouth 3 (three) times daily.   ETOPOSIDE IV Inject into the vein every 21 ( twenty-one) days.   famotidine 20 MG tablet Commonly known as: PEPCID Take 1 tablet (20 mg total) by mouth 2 (two) times daily.   fexofenadine 180 MG tablet Commonly known as: ALLEGRA Take 1 tablet (180 mg total) by mouth daily.   ibuprofen 800 MG tablet Commonly known as: ADVIL Take 1 tablet (800 mg total) by mouth every 8 (eight) hours as needed.   lidocaine-prilocaine cream Commonly known as: EMLA Apply a small amount to port a cath site and cover with plastic wrap 1 hour prior to infusion appointments   magic mouthwash w/lidocaine Soln Take 10 mLs by mouth 3 (three) times daily as needed for mouth pain.   megestrol 400 MG/10ML suspension Commonly known as: MEGACE Take 10 mLs (400 mg total) by mouth 2 (two) times daily.   metoprolol tartrate 50 MG tablet Commonly known as: LOPRESSOR Take 0.5 tablets (25 mg total) by mouth 2 (two) times daily.   Misc. Devices Misc Patient require 6L Nasal Cannula Oxygen   naloxone 4 MG/0.1ML Liqd nasal spray kit Commonly known as: NARCAN Place 1 spray into the nose as needed (opioid overdose).   nortriptyline 50 MG capsule Commonly known as: PAMELOR Take 50 mg by mouth 2 (two) times daily.   Ofev 150 MG Caps Generic drug: Nintedanib Take 1 capsule (150 mg total) by mouth 2 (two) times daily.   oxyCODONE 15 MG immediate release tablet Commonly known as: ROXICODONE Take 15 mg by mouth 4 (four) times daily.   oxyCODONE 20 mg 12 hr tablet Commonly known as: OXYCONTIN Take 1 tablet (20 mg total) by mouth every 12 (twelve) hours.   prochlorperazine 10 MG tablet Commonly known as: COMPAZINE Take 1 tablet (10 mg total) by mouth every 6 (six) hours as needed for nausea or vomiting.   traZODone 50 MG tablet Commonly  known as: DESYREL Take 2 tablets (100 mg total) by mouth at bedtime. What changed:  when to take this reasons to take this               Durable Medical Equipment  (From admission, onward)           Start     Ordered   12/23/22 1710  For home use only DME oxygen  Once       Question Answer Comment  Length of Need Lifetime   Mode or (Route) Nasal cannula   Liters per Minute 6   Frequency Continuous (stationary and portable oxygen unit needed)   Oxygen conserving device No   Oxygen delivery system Gas      12/23/22 1710             Consultations: PCCM  Procedures/Studies:  DG CHEST PORT 1 VIEW  Result Date: 12/27/2022 CLINICAL DATA:  Left pneumothorax EXAM: PORTABLE CHEST 1 VIEW COMPARISON:  Previous studies including the examination of 12/26/2022 FINDINGS: There is a interval decrease in size of left pneumothorax with small residual left pneumothorax in the periphery of left upper  lung field. Left chest tube is noted in place. Transverse diameter of heart is increased. Increased interstitial markings are seen in both lungs. There are patchy densities in right mid and right lower lung fields. Right hilum is more prominent than left. There is blunting of lateral CP angles. Tip of left IJ chest port is seen at the junction of superior vena cava and right atrium. IMPRESSION: There is interval decrease in size of pneumothorax in the periphery of left upper lung field. Electronically Signed   By: Ernie Avena M.D.   On: 12/27/2022 09:16   DG CHEST PORT 1 VIEW  Result Date: 12/26/2022 CLINICAL DATA:  [id number] Chest tube in place [id number] EXAM: PORTABLE CHEST - 1 VIEW COMPARISON:  Earlier film of the same day FINDINGS: Stable left pigtail chest tube directed towards the hilum. Small scratch the apical and lateral a pneumothorax slightly more conspicuous, the lung apex projecting below the posterior aspect left third rib. Stable right IJ port catheter to the mid right  atrium. Masslike opacity at the right lung base stable. Diffuse interstitial lung disease stable. Heart size normal. Aortic Atherosclerosis (ICD10-170.0). Right hilar and paratracheal fullness suggesting adenopathy. No effusion. Visualized bones unremarkable. IMPRESSION: 1. Small left apical and lateral pneumothorax, with chest tube in place. 2. Stable right lower lobe mass, and hilar adenopathy. Electronically Signed   By: Corlis Leak M.D.   On: 12/26/2022 20:33   DG CHEST PORT 1 VIEW  Result Date: 12/26/2022 CLINICAL DATA:  Chest tube in place. EXAM: PORTABLE CHEST 1 VIEW COMPARISON:  Radiograph yesterday FINDINGS: Decreased size of left pneumothorax, lateral component no longer visualized. Small residual at the apex. Left pigtail catheter remains in place. Left chest port remains in place. Otherwise unchanged appearance of the chest with chronic lung disease and right-sided thoracic malignancy. IMPRESSION: Decreased size of left pneumothorax, small residual. Left pigtail catheter remains in place. Electronically Signed   By: Narda Rutherford M.D.   On: 12/26/2022 13:00   DG CHEST PORT 1 VIEW  Result Date: 12/25/2022 CLINICAL DATA:  Chest tube clamped. EXAM: PORTABLE CHEST 1 VIEW COMPARISON:  Earlier today. FINDINGS: Increased size of left pneumothorax, small to moderate, with increasing lateral component. For example lateral component measures 11 mm at the level of the port, previously 4 mm. Pigtail catheter in the left mid lung again seen. The exam is otherwise unchanged IMPRESSION: Increased size of left pneumothorax, small to moderate, with increasing lateral component. Pigtail catheter remains in place. Electronically Signed   By: Narda Rutherford M.D.   On: 12/25/2022 20:49   DG CHEST PORT 1 VIEW  Result Date: 12/25/2022 CLINICAL DATA:  Chest tube in place, on water seal. EXAM: PORTABLE CHEST 1 VIEW COMPARISON:  Earlier today FINDINGS: Slight increased size of left pneumothorax with chest tube on  water seal, still small, however now visualized laterally. Pigtail catheter in place unchanged in positioning. Left chest port remains in place. Chronic lung disease and known right lung malignancy are unchanged. IMPRESSION: Slight increased size of left pneumothorax with chest tube on water seal, still small, however now visualized laterally. Left pigtail catheter in place. Electronically Signed   By: Narda Rutherford M.D.   On: 12/25/2022 20:47   DG CHEST PORT 1 VIEW  Result Date: 12/25/2022 CLINICAL DATA:  Chest tube in place, no pneumothorax. EXAM: PORTABLE CHEST 1 VIEW COMPARISON:  Most recent radiograph yesterday. FINDINGS: Unchanged small left apical pneumothorax from yesterday. Left-sided pigtail catheter remains in place. Left chest port in  place. The exam is otherwise unchanged with sequela of right lung malignancy and chronic lung disease. IMPRESSION: 1. Unchanged small left apical pneumothorax with left chest tube in place. 2. Unchanged right lung malignancy and chronic lung disease. Electronically Signed   By: Narda Rutherford M.D.   On: 12/25/2022 10:31   DG Chest Port 1 View  Result Date: 12/24/2022 CLINICAL DATA:  Pneumothorax. EXAM: PORTABLE CHEST 1 VIEW COMPARISON:  Radiograph yesterday FINDINGS: Left-sided pigtail catheter remains in place. Stable size of small left apical pneumothorax. Left chest port remains in place. Known right lung malignancy and chronic lung disease are otherwise unchanged. IMPRESSION: 1. Unchanged small left apical pneumothorax. Left chest tube remains in place. 2. Unchanged right lung malignancy and chronic lung disease. Electronically Signed   By: Narda Rutherford M.D.   On: 12/24/2022 11:16   DG Chest Port 1 View  Result Date: 12/23/2022 CLINICAL DATA:  Shortness of breath. EXAM: PORTABLE CHEST 1 VIEW COMPARISON:  Chest x-ray Dec 23, 2022. FINDINGS: Left chest tube in place with decreased size of the left pneumothorax, now small. Similar patchy interstitial  airspace opacities. No new masses better characterized on recent CT chest. No mediastinal shift. Otherwise, cardiomediastinal silhouette is similar. Left subclavian approach Port-A-Cath with tip in the right atrium. IMPRESSION: Left chest tube in place with decreased size of the left pneumothorax, now small. Electronically Signed   By: Feliberto Harts M.D.   On: 12/23/2022 11:41   DG Chest Port 1 View  Result Date: 12/23/2022 CLINICAL DATA:  sob EXAM: PORTABLE CHEST 1 VIEW COMPARISON:  November 10, 2022. FINDINGS: Large left pneumothorax. Some rightward mediastinal shift. Otherwise, cardiomediastinal silhouette is similar. Left subclavian approach cardiac rhythm maintenance device with the tip projecting at the right atrium. Patchy interstitial and airspace opacities throughout the lungs. Known masses better characterized on recent CT chest. IMPRESSION: 1. Large left pneumothorax. Some rightward mediastinal shift raises concern for tension physiology component. 2. Known masses better characterized on recent CT chest. 3. Additional patchy interstitial and airspace opacities throughout the lungs could represent changes of emphysema but superimposed infection or aspiration is difficult to exclude. Findings discussed with Dr. Charm Barges via telephone at 10:26 AM. Electronically Signed   By: Feliberto Harts M.D.   On: 12/23/2022 10:28   MR Brain W Wo Contrast  Result Date: 12/16/2022 CLINICAL DATA:  Non-small cell lung cancer, staging a EXAM: MRI HEAD WITHOUT AND WITH CONTRAST TECHNIQUE: Multiplanar, multiecho pulse sequences of the brain and surrounding structures were obtained without and with intravenous contrast. CONTRAST:  6mL GADAVIST GADOBUTROL 1 MMOL/ML IV SOLN COMPARISON:  04/13/2017 FINDINGS: Evaluation is somewhat limited by motion artifact. Brain: Linear area of restricted diffusion in the posterior right cerebellum, which measures up to 18 x 6 x 8 mm (AP x TR x CC). Additional smaller areas of  restricted diffusion with ADC correlates are noted in the bilateral cerebellar hemispheres (series 5, images 64 and 66) and left paramedian pons (series 5, image 64), which are T2 hyperintense and nonenhancing, concerning for acute infarcts. Additional punctate foci of increased diffusivity without definite ADC correlate in the right posterior frontal white matter (series 5, image 88), the left occipital lobe (series 5, image 80), and right caudate tail (series 5, image 81), concerning for subacute infarcts. Diffusion restricting focus with ADC correlate in the anteromedial right frontal cortex (series 5, image 81) correlates with possible contrast enhancement (series 19, image 26 and series 18, image 32), which could represent metastatic disease. No significant  associated T2 hyperintense signal. No other abnormal parenchymal enhancement. No acute hemorrhage, mass, mass effect, or midline shift. No hydrocephalus or extra-axial collection. Normal pituitary and craniocervical junction. No hemosiderin deposition to suggest remote hemorrhage. Normal cerebral volume for age. Scattered T2 hyperintense signal in the periventricular white matter, likely the sequela of mild-to-moderate chronic small vessel ischemic disease. Vascular: Normal arterial flow voids. Normal arterial and venous enhancement. Skull and upper cervical spine: Normal marrow signal. Sinuses/Orbits: Air-fluid level in the right maxillary sinus. Mucosal thickening in the left maxillary sinus. No acute finding in the orbits. Other: The mastoid air cells are well aerated. IMPRESSION: 1. Evaluation is somewhat limited by motion artifact. 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 2. Additional focus of diffusion restriction in the medial left frontal cortex which correlates with possible contrast enhancement, although this is not well defined, in part due to motion. This is concerning for  a focus of metastatic disease but could also represent an acute infarct. Attention on follow-up. 3. Additional likely subacute infarcts in the right posterior frontal lobe, left occipital lobe, and right caudate tail. 4. Air-fluid level in the right maxillary sinus, which is nonspecific but can be seen in the setting of acute sinusitis. Electronically Signed   By: Wiliam Ke M.D.   On: 12/16/2022 14:40   NM PET Image Initial (PI) Skull Base To Thigh  Result Date: 12/16/2022 CLINICAL DATA:  Initial treatment strategy for non-small cell lung cancer. EXAM: NUCLEAR MEDICINE PET SKULL BASE TO THIGH TECHNIQUE: 6.8 mCi F-18 FDG was injected intravenously. Full-ring PET imaging was performed from the skull base to thigh after the radiotracer. CT data was obtained and used for attenuation correction and anatomic localization. Fasting blood glucose: 111 mg/dl COMPARISON:  CT chest 54/04/8118. FINDINGS: Mediastinal blood pool activity: SUV max 1.7 Liver activity: SUV max NA NECK: Hypermetabolic posterior left level 2 lymph node measures 8 mm (3/33), SUV max 2.1. No additional abnormal hypermetabolism. Incidental CT findings: None. CHEST: Extensive hypermetabolic adenopathy in the low internal jugular, supraclavicular, mediastinal and right hilar stations. Index low right paratracheal nodal mass measures approximately 3.7 cm (3/93), SUV max 6.5. Masslike consolidation in the right lower lobe is somewhat difficult to measure but collectively, measures approximately 8.9 x 9.1 cm (7/74), with SUV max measured superiorly, 8.2. Incidental CT findings: Left IJ Port-A-Cath terminates in the right atrium. Atherosclerotic calcification of the aorta and coronary arteries. Heart is at the upper limits of normal in size. Trace pericardial effusion. Small loculated right pleural effusion. Paraseptal emphysema. Suspect basilar honeycombing. ABDOMEN/PELVIS: Vague activity in the periportal region without a definite CT correlate. High  left periaortic lymph nodes measure up to approximately 7 mm (3/155), SUV max 2.7. Small nodules posterior to the right kidney measure up to 10 mm (3/186) do not show abnormal hypermetabolism. No additional abnormal hypermetabolism. Incidental CT findings: Liver, adrenal glands, kidneys, spleen, pancreas, stomach and bowel are grossly unremarkable. Cholecystectomy. Prostate is slightly enlarged. SKELETON: No abnormal hypermetabolism. Incidental CT findings: Degenerative changes in the spine. Postoperative changes in the lumbar spine. IMPRESSION: 1. 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 2. Small nodules posterior to the right kidney do not show abnormal hypermetabolism. Recommend attention on follow-up. 3. Trace pericardial effusion. 4. Small loculated right pleural effusion. 5. 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 6. Mildly enlarged prostate.  7. Aortic atherosclerosis (ICD10-I70.0). Coronary artery calcification. Electronically Signed   By: Leanna Battles M.D.   On: 12/16/2022 14:39   IR IMAGING GUIDED PORT INSERTION  Result Date: 12/14/2022 INDICATION: SCLC EXAM: IMPLANTED PORT A CATH PLACEMENT WITH ULTRASOUND AND FLUOROSCOPIC GUIDANCE MEDICATIONS: None ANESTHESIA/SEDATION: Moderate (conscious) sedation was employed during this procedure. A total of Versed 2 mg and Fentanyl 100 mcg was administered intravenously. Moderate Sedation Time: 33 minutes. The patient's level of consciousness and vital signs were monitored continuously by radiology nursing throughout the procedure under my direct supervision. FLUOROSCOPY TIME:  Fluoroscopic dose; 1 mGy COMPLICATIONS: None immediate. PROCEDURE: The procedure, risks, benefits, and alternatives were explained to the patient. Questions regarding the procedure were encouraged and answered. The  patient understands and consents to the procedure. The LEFT neck and chest were prepped with chlorhexidine in a sterile fashion, and a sterile drape was applied covering the operative field. Maximum barrier sterile technique with sterile gowns and gloves were used for the procedure. A timeout was performed prior to the initiation of the procedure. Local anesthesia was provided with 1% lidocaine with epinephrine. After creating a small venotomy incision, a micropuncture kit was utilized to access the internal jugular vein under direct, real-time ultrasound guidance. Ultrasound image documentation was performed. The microwire was kinked to measure appropriate catheter length. A subcutaneous port pocket was then created along the upper chest wall utilizing a combination of sharp and blunt dissection. The pocket was irrigated with sterile saline. A single lumen ISP power injectable port was chosen for placement. The 8 Fr catheter was tunneled from the port pocket site to the venotomy incision. The port was placed in the pocket. The external catheter was trimmed to appropriate length. At the venotomy, an 8 Fr peel-away sheath was placed over a guidewire under fluoroscopic guidance. The catheter was then placed through the sheath and the sheath was removed. Final catheter positioning was confirmed and documented with a fluoroscopic spot radiograph. The port was accessed with a Huber needle, aspirated and flushed with heparinized saline. The port pocket incision was closed with interrupted 3-0 Vicryl suture then Dermabond was applied, including at the venotomy incision. Dressings were placed. The patient tolerated the procedure well without immediate post procedural complication. IMPRESSION: Successful placement of a LEFT internal jugular approach power injectable Port-A-Cath. The tip of the catheter is positioned within the proximal RIGHT atrium. The catheter is ready for immediate use. Roanna Banning, MD [facility] Radiology Electronically Signed   By: Roanna Banning M.D.   On: 12/14/2022 11:06   [place] CORE BIOPSY (LYMPH NODES)  Result Date: 12/12/2022 INDICATION: R Lung Mass with R Supraclavicular Lymph Node EXAM: ULTRASOUND-GUIDED RIGHT SUPRACLAVICULAR LYMPH NODE BIOPSY COMPARISON:  CT chest, 11/24/2022. MEDICATIONS: 5 mL lidocaine 1% ANESTHESIA/SEDATION: Local anesthetic was administered. COMPLICATIONS: None immediate. TECHNIQUE: Informed written consent was obtained from the patient and/or patient's representative after a discussion of the risks, benefits and alternatives to treatment. Questions regarding the procedure were encouraged and answered. Initial ultrasound scanning demonstrated enlarged RIGHT supraclavicular lymph mass. An ultrasound image was saved for documentation purposes. The procedure was planned. A timeout was performed prior to the initiation of the procedure. The operative was prepped and draped in the usual sterile fashion, and a sterile drape was applied covering the operative field. A timeout was performed prior to the initiation of the procedure. Local anesthesia was provided with 1% lidocaine. Under direct ultrasound guidance, an 18 gauge core needle device was utilized to  obtain to obtain 4 core needle biopsies of the RIGHT supraclavicular lymph node. The samples were placed in saline and submitted to pathology. The needle was removed and superficial hemostasis was achieved with manual compression. Post procedure scan was negative for significant hematoma. A dressing was applied. The patient tolerated the procedure well without immediate postprocedural complication. IMPRESSION: Successful ultrasound guided biopsy of the enlarged RIGHT supraclavicular lymph node. Roanna Banning, MD [facility] Radiology Electronically Signed   By: Roanna Banning M.D.   On: 12/12/2022 14:46     Subjective: - no chest pain,  shortness of breath, no abdominal pain, nausea or vomiting.   Discharge Exam: BP 99/61 (BP Location: Left Arm)   Pulse 89   Temp 98.2 F (36.8 C) (Oral)   Resp 18   Ht 6' (1.829 m)   Wt 59.5 kg   SpO2 100%   BMI 17.78 kg/m   General: Pt is alert, awake, not in acute distress Cardiovascular: RRR, S1/S2 +, no rubs, no gallops Respiratory: CTA bilaterally, no wheezing, no rhonchi Abdominal: Soft, NT, ND, bowel sounds + Extremities: no edema, no cyanosis   The results of significant diagnostics from this hospitalization (including imaging, microbiology, ancillary and laboratory) are listed below for reference.     Microbiology: Recent Results (from the past 240 hour(s))  Culture, blood (routine x 2)     Status: None (Preliminary result)   Collection Time: 12/23/22 10:20 AM   Specimen: Right Antecubital; Blood  Result Value Ref Range Status   Specimen Description   Final    RIGHT ANTECUBITAL BOTTLES DRAWN AEROBIC AND ANAEROBIC   Special Requests   Final    Blood Culture results may not be optimal due to an excessive volume of blood received in culture bottles   Culture   Final    NO GROWTH 4 DAYS Performed at [facility], [address], [place], [place] [address]    Report Status PENDING  Incomplete  Culture, blood (routine x 2)     Status: None (Preliminary result)   Collection Time: 12/23/22 10:20 AM   Specimen: Left Antecubital; Blood  Result Value Ref Range Status   Specimen Description   Final    LEFT ANTECUBITAL BOTTLES DRAWN AEROBIC AND ANAEROBIC   Special Requests Blood Culture adequate volume  Final   Culture   Final    NO GROWTH 4 DAYS Performed at [facility], [address], [place], [place] [address]    Report Status PENDING  Incomplete  SARS Coronavirus 2 by RT PCR (hospital order, performed in [facility] hospital lab) *cepheid single result test* Anterior Nasal Swab     Status: None   Collection Time: 12/23/22 10:32 AM   Specimen: Anterior Nasal Swab   Result Value Ref Range Status   SARS Coronavirus 2 by RT PCR NEGATIVE NEGATIVE Final    Comment: (NOTE) SARS-CoV-2 target nucleic acids are NOT DETECTED.  The SARS-CoV-2 RNA is generally detectable in upper and lower respiratory specimens during the acute phase of infection. The lowest concentration of SARS-CoV-2 viral copies this assay can detect is 250 copies / mL. A negative result does not preclude SARS-CoV-2 infection and should not be used as the sole basis for treatment or other patient management decisions.  A negative result may occur with improper specimen collection / handling, submission of specimen other than nasopharyngeal swab, presence of viral mutation(s) within the areas targeted by this assay, and inadequate number of viral copies (<250 copies /  mL). A negative result must be combined with clinical observations, patient history, and epidemiological information.  Fact Sheet for Patients:   RoadLapTop.co.za  Fact Sheet for Healthcare Providers: http://kim-miller.com/  This test is not yet approved or  cleared by the [place] FDA and has been authorized for detection and/or diagnosis of SARS-CoV-2 by FDA under an Emergency Use Authorization (EUA).  This EUA will remain in effect (meaning this test can be used) for the duration of the COVID-19 declaration under Section 564(b)(1) of the Act, 21 U.S.C. section 360bbb-3(b)(1), unless the authorization is terminated or revoked sooner.  Performed at [facility], [address] [place], [place] [address]      Labs: Basic Metabolic Panel: Recent Labs  Lab 12/23/22 1021 12/23/22 1153 12/24/22 0353 12/25/22 0350 12/26/22 0407 12/27/22 0420  NA 136  --  136 136 139 134*  K 4.0  --  4.5 4.6 4.5 4.4  CL 94*  --  99 98 99 96*  CO2 30  --  28 32 34* 32  GLUCOSE 144*  --  147* 125* 90 102*  BUN 14  --  17 20 27* 26*  CREATININE 0.71  --  0.82 0.77 0.73 0.71   CALCIUM 9.5  --  9.1 9.2 9.3 9.1  MG  --  1.9  --   --   --   --   PHOS  --  3.8  --   --   --   --    Liver Function Tests: Recent Labs  Lab 12/23/22 1021  AST 25  ALT 23  ALKPHOS 68  BILITOT 0.9  PROT 8.6*  ALBUMIN 3.7   CBC: Recent Labs  Lab 12/23/22 1021 12/24/22 0353 12/25/22 0350 12/26/22 0407 12/27/22 0420  WBC 11.3* 8.8 13.6* 11.6* 10.9*  NEUTROABS 9.8*  --   --   --   --   HGB 13.9 12.5* 11.2* 12.0* 11.8*  HCT 44.2 40.2 36.4* 38.7* 38.1*  MCV 91.9 93.9 92.9 94.2 93.6  PLT 314 287 273 277 220   CBG: Recent Labs  Lab 12/24/22 0856 12/26/22 0800 12/27/22 0839  GLUCAP 221* 75 132*   Hgb A1c No results for input(s): "HGBA1C" in the last 72 hours. Lipid Profile No results for input(s): "CHOL", "HDL", "LDLCALC", "TRIG", "CHOLHDL", "LDLDIRECT" in the last 72 hours. Thyroid function studies No results for input(s): "TSH", "T4TOTAL", "T3FREE", "THYROIDAB" in the last 72 hours.  Invalid input(s): "FREET3" Urinalysis    Component Value Date/Time   COLORURINE STRAW (A) 03/18/2021 1119   APPEARANCEUR CLEAR 03/18/2021 1119   APPEARANCEUR Clear 06/13/2017 1545   LABSPEC 1.003 (L) 03/18/2021 1119   PHURINE 7.0 03/18/2021 1119   GLUCOSEU NEGATIVE 03/18/2021 1119   HGBUR NEGATIVE 03/18/2021 1119   BILIRUBINUR NEGATIVE 03/18/2021 1119   BILIRUBINUR Negative 06/13/2017 1545   KETONESUR NEGATIVE 03/18/2021 1119   PROTEINUR NEGATIVE 03/18/2021 1119   UROBILINOGEN 1.0 12/28/2013 1703   NITRITE NEGATIVE 03/18/2021 1119   LEUKOCYTESUR NEGATIVE 03/18/2021 1119    FURTHER DISCHARGE INSTRUCTIONS:   Get Medicines reviewed and adjusted: Please take all your medications with you for your next visit with your Primary MD   Laboratory/radiological data: Please request your Primary MD to go over all hospital tests and procedure/radiological results at the follow up, please ask your Primary MD to get all Hospital records sent to his/her office.   In some cases, they  will be blood work, cultures and biopsy results pending at the time of your discharge. Please  request that your primary care M.D. goes through all the records of your hospital data and follows up on these results.   Also Note the following: If you experience worsening of your admission symptoms, develop shortness of breath, life threatening emergency, suicidal or homicidal thoughts you must seek medical attention immediately by calling 911 or calling your MD immediately  if symptoms less severe.   You must read complete instructions/literature along with all the possible adverse reactions/side effects for all the Medicines you take and that have been prescribed to you. Take any new Medicines after you have completely understood and accpet all the possible adverse reactions/side effects.    Do not drive when taking Pain medications or sleeping medications (Benzodaizepines)   Do not take more than prescribed Pain, Sleep and Anxiety Medications. It is not advisable to combine anxiety,sleep and pain medications without talking with your primary care practitioner   Special Instructions: If you have smoked or chewed Tobacco  in the last 2 yrs please stop smoking, stop any regular Alcohol  and or any Recreational drug use.   Wear Seat belts while driving.   Please note: You were cared for by a hospitalist during your hospital stay. Once you are discharged, your primary care physician will handle any further medical issues. Please note that NO REFILLS for any discharge medications will be authorized once you are discharged, as it is imperative that you return to your primary care physician (or establish a relationship with a primary care physician if you do not have one) for your post hospital discharge needs so that they can reassess your need for medications and monitor your lab values.  Time coordinating discharge: 35 minutes  SIGNED:  Pamella Pert, MD, PhD 12/27/2022, 1:46 PM

## 2022-12-27 NOTE — Telephone Encounter (Signed)
Erskine Squibb calling with call report. For chest xray. Erskine Squibb phone number is [phone].

## 2022-12-27 NOTE — Progress Notes (Signed)
PT Cancellation Note  Patient Details Name: Bradley Rice MRN: [id number] DOB: 09/21/1960   Cancelled Treatment:   PT re-order noted. Reason Eval/Treat Not Completed:PT signed off again;   pt seen sitting EOB with family today, states he is still mobilizing without difficulty other than O2 and CT.   See PT Eval  from 5/26 (pt needing assist only with lines at that time)   [facility] 12/27/2022, 1:29 PM

## 2022-12-27 NOTE — Progress Notes (Signed)
Patient discharge instructions explained to patient and wife. Patient follow-up appointments confirmed with patient. Medication regimen explained to patient. Patient's PIV removed. Patient safely transported via wheelchair to discharge at main entrance.

## 2022-12-28 LAB — CULTURE, BLOOD (ROUTINE X 2): Special Requests: ADEQUATE

## 2022-12-28 NOTE — Telephone Encounter (Signed)
Order was placed.

## 2022-12-29 ENCOUNTER — Inpatient Hospital Stay: Payer: 59

## 2022-12-29 ENCOUNTER — Ambulatory Visit (HOSPITAL_COMMUNITY)
Admission: RE | Admit: 2022-12-29 | Discharge: 2022-12-29 | Disposition: A | Discharge status: Home / Self Care | Payer: 59 | Source: Ambulatory Visit | Attending: Physician Assistant | Admitting: Physician Assistant

## 2022-12-29 ENCOUNTER — Other Ambulatory Visit: Payer: Self-pay

## 2022-12-29 ENCOUNTER — Encounter (HOSPITAL_COMMUNITY): Payer: Self-pay

## 2022-12-29 ENCOUNTER — Inpatient Hospital Stay (HOSPITAL_COMMUNITY)
Admission: EM | Admit: 2022-12-29 | Discharge: 2023-01-03 | DRG: 164 | Disposition: A | Discharge status: Home / Self Care | Payer: 59 | Attending: Internal Medicine | Admitting: Internal Medicine

## 2022-12-29 ENCOUNTER — Emergency Department (HOSPITAL_COMMUNITY): Payer: 59

## 2022-12-29 ENCOUNTER — Inpatient Hospital Stay: Payer: 59 | Admitting: Licensed Clinical Social Worker

## 2022-12-29 ENCOUNTER — Inpatient Hospital Stay (HOSPITAL_COMMUNITY): Payer: 59

## 2022-12-29 ENCOUNTER — Inpatient Hospital Stay (HOSPITAL_BASED_OUTPATIENT_CLINIC_OR_DEPARTMENT_OTHER): Payer: 59 | Admitting: Physician Assistant

## 2022-12-29 VITALS — Wt 125.4 lb

## 2022-12-29 DIAGNOSIS — L89152 Pressure ulcer of sacral region, stage 2: Secondary | ICD-10-CM | POA: Diagnosis present

## 2022-12-29 DIAGNOSIS — R64 Cachexia: Secondary | ICD-10-CM | POA: Diagnosis present

## 2022-12-29 DIAGNOSIS — I2510 Atherosclerotic heart disease of native coronary artery without angina pectoris: Secondary | ICD-10-CM | POA: Diagnosis present

## 2022-12-29 DIAGNOSIS — R000 Tachycardia, unspecified: Secondary | ICD-10-CM | POA: Diagnosis present

## 2022-12-29 DIAGNOSIS — C779 Secondary and unspecified malignant neoplasm of lymph node, unspecified: Secondary | ICD-10-CM | POA: Diagnosis present

## 2022-12-29 DIAGNOSIS — Z9049 Acquired absence of other specified parts of digestive tract: Secondary | ICD-10-CM | POA: Diagnosis not present

## 2022-12-29 DIAGNOSIS — J9312 Secondary spontaneous pneumothorax: Secondary | ICD-10-CM

## 2022-12-29 DIAGNOSIS — C3492 Malignant neoplasm of unspecified part of left bronchus or lung: Secondary | ICD-10-CM | POA: Diagnosis not present

## 2022-12-29 DIAGNOSIS — I10 Essential (primary) hypertension: Secondary | ICD-10-CM | POA: Diagnosis not present

## 2022-12-29 DIAGNOSIS — C3431 Malignant neoplasm of lower lobe, right bronchus or lung: Secondary | ICD-10-CM | POA: Diagnosis present

## 2022-12-29 DIAGNOSIS — L8990 Pressure ulcer of unspecified site, unspecified stage: Secondary | ICD-10-CM | POA: Insufficient documentation

## 2022-12-29 DIAGNOSIS — Z87891 Personal history of nicotine dependence: Secondary | ICD-10-CM | POA: Diagnosis not present

## 2022-12-29 DIAGNOSIS — F32A Depression, unspecified: Secondary | ICD-10-CM | POA: Diagnosis present

## 2022-12-29 DIAGNOSIS — Y838 Other surgical procedures as the cause of abnormal reaction of the patient, or of later complication, without mention of misadventure at the time of the procedure: Secondary | ICD-10-CM | POA: Diagnosis present

## 2022-12-29 DIAGNOSIS — Z981 Arthrodesis status: Secondary | ICD-10-CM | POA: Diagnosis not present

## 2022-12-29 DIAGNOSIS — Z8673 Personal history of transient ischemic attack (TIA), and cerebral infarction without residual deficits: Secondary | ICD-10-CM

## 2022-12-29 DIAGNOSIS — J4489 Other specified chronic obstructive pulmonary disease: Secondary | ICD-10-CM | POA: Diagnosis present

## 2022-12-29 DIAGNOSIS — Z9981 Dependence on supplemental oxygen: Secondary | ICD-10-CM

## 2022-12-29 DIAGNOSIS — J9311 Primary spontaneous pneumothorax: Principal | ICD-10-CM

## 2022-12-29 DIAGNOSIS — Z79899 Other long term (current) drug therapy: Secondary | ICD-10-CM

## 2022-12-29 DIAGNOSIS — C3491 Malignant neoplasm of unspecified part of right bronchus or lung: Secondary | ICD-10-CM

## 2022-12-29 DIAGNOSIS — J449 Chronic obstructive pulmonary disease, unspecified: Secondary | ICD-10-CM | POA: Diagnosis not present

## 2022-12-29 DIAGNOSIS — M5136 Other intervertebral disc degeneration, lumbar region: Secondary | ICD-10-CM | POA: Diagnosis present

## 2022-12-29 DIAGNOSIS — J939 Pneumothorax, unspecified: Secondary | ICD-10-CM | POA: Diagnosis not present

## 2022-12-29 DIAGNOSIS — G1221 Amyotrophic lateral sclerosis: Secondary | ICD-10-CM | POA: Diagnosis present

## 2022-12-29 DIAGNOSIS — Z681 Body mass index (BMI) 19 or less, adult: Secondary | ICD-10-CM | POA: Diagnosis not present

## 2022-12-29 DIAGNOSIS — Z79891 Long term (current) use of opiate analgesic: Secondary | ICD-10-CM

## 2022-12-29 DIAGNOSIS — J9383 Other pneumothorax: Principal | ICD-10-CM | POA: Diagnosis present

## 2022-12-29 DIAGNOSIS — J9611 Chronic respiratory failure with hypoxia: Secondary | ICD-10-CM | POA: Diagnosis present

## 2022-12-29 DIAGNOSIS — Z9852 Vasectomy status: Secondary | ICD-10-CM

## 2022-12-29 DIAGNOSIS — G894 Chronic pain syndrome: Secondary | ICD-10-CM | POA: Diagnosis present

## 2022-12-29 DIAGNOSIS — J9382 Other air leak: Secondary | ICD-10-CM | POA: Diagnosis present

## 2022-12-29 DIAGNOSIS — Z85118 Personal history of other malignant neoplasm of bronchus and lung: Secondary | ICD-10-CM

## 2022-12-29 DIAGNOSIS — Z9861 Coronary angioplasty status: Secondary | ICD-10-CM | POA: Diagnosis not present

## 2022-12-29 DIAGNOSIS — Z1152 Encounter for screening for COVID-19: Secondary | ICD-10-CM | POA: Diagnosis not present

## 2022-12-29 DIAGNOSIS — F419 Anxiety disorder, unspecified: Secondary | ICD-10-CM | POA: Diagnosis present

## 2022-12-29 DIAGNOSIS — Z888 Allergy status to other drugs, medicaments and biological substances status: Secondary | ICD-10-CM

## 2022-12-29 DIAGNOSIS — J95811 Postprocedural pneumothorax: Secondary | ICD-10-CM | POA: Diagnosis present

## 2022-12-29 LAB — CREATININE, SERUM
Creatinine, Ser: 0.56 mg/dL — ABNORMAL LOW (ref 0.61–1.24)
GFR, Estimated: >60 mL/min (ref >60)

## 2022-12-29 LAB — COMPREHENSIVE METABOLIC PANEL
ALT: 45 U/L — ABNORMAL HIGH (ref 0–44)
AST: 21 U/L (ref 15–41)
Albumin: 3.6 g/dL (ref 3.5–5.0)
Alkaline Phosphatase: 63 U/L (ref 38–126)
Anion gap: 10 (ref 5–15)
BUN: 12 mg/dL (ref 6–20)
CO2: 27 mmol/L (ref 22–32)
Calcium: 9.4 mg/dL (ref 8.9–10.3)
Chloride: 97 mmol/L — ABNORMAL LOW (ref 98–111)
Creatinine, Ser: 0.57 mg/dL — ABNORMAL LOW (ref 0.61–1.24)
GFR, Estimated: >60 mL/min (ref >60)
Glucose, Bld: 122 mg/dL — ABNORMAL HIGH (ref 70–99)
Potassium: 4 mmol/L (ref 3.5–5.1)
Sodium: 134 mmol/L — ABNORMAL LOW (ref 135–145)
Total Bilirubin: 0.6 mg/dL (ref 0.3–1.2)
Total Protein: 8.1 g/dL (ref 6.5–8.1)

## 2022-12-29 LAB — CBC WITH DIFFERENTIAL/PLATELET
Abs Immature Granulocytes: 0.03 10*3/uL (ref 0.00–0.07)
Basophils Absolute: 0.1 10*3/uL (ref 0.0–0.1)
Basophils Relative: 1 %
Eosinophils Absolute: 0.1 10*3/uL (ref 0.0–0.5)
Eosinophils Relative: 2 %
HCT: 38.2 % — ABNORMAL LOW (ref 39.0–52.0)
Hemoglobin: 12.5 g/dL — ABNORMAL LOW (ref 13.0–17.0)
Immature Granulocytes: 0 %
Lymphocytes Relative: 14 %
Lymphs Abs: 1 10*3/uL (ref 0.7–4.0)
MCH: 29.4 pg (ref 26.0–34.0)
MCHC: 32.7 g/dL (ref 30.0–36.0)
MCV: 89.9 fL (ref 80.0–100.0)
Monocytes Absolute: 0.3 10*3/uL (ref 0.1–1.0)
Monocytes Relative: 4 %
Neutro Abs: 5.7 10*3/uL (ref 1.7–7.7)
Neutrophils Relative %: 79 %
Platelets: 184 10*3/uL (ref 150–400)
RBC: 4.25 MIL/uL (ref 4.22–5.81)
RDW: 13.8 % (ref 11.5–15.5)
WBC: 7.1 10*3/uL (ref 4.0–10.5)
nRBC: 0 % (ref 0.0–0.2)

## 2022-12-29 LAB — CBC
HCT: 35.7 % — ABNORMAL LOW (ref 39.0–52.0)
Hemoglobin: 11.5 g/dL — ABNORMAL LOW (ref 13.0–17.0)
MCH: 28.8 pg (ref 26.0–34.0)
MCHC: 32.2 g/dL (ref 30.0–36.0)
MCV: 89.3 fL (ref 80.0–100.0)
Platelets: 164 10*3/uL (ref 150–400)
RBC: 4 MIL/uL — ABNORMAL LOW (ref 4.22–5.81)
RDW: 13.5 % (ref 11.5–15.5)
WBC: 5.4 10*3/uL (ref 4.0–10.5)
nRBC: 0 % (ref 0.0–0.2)

## 2022-12-29 LAB — MAGNESIUM: Magnesium: 1.9 mg/dL (ref 1.7–2.4)

## 2022-12-29 MED ORDER — OXYCODONE HCL 5 MG PO TABS
5.0000 mg | ORAL_TABLET | ORAL | Status: DC | PRN
  Administered 2022-12-30 – 2023-01-03 (×13): 10 mg via ORAL
  Filled 2022-12-29 (×13): qty 2

## 2022-12-29 MED ORDER — NALOXONE HCL 0.4 MG/ML IJ SOLN
0.4000 mg | INTRAMUSCULAR | Status: DC | PRN
  Filled 2022-12-29: qty 1

## 2022-12-29 MED ORDER — MORPHINE SULFATE (PF) 2 MG/ML IV SOLN
2.0000 mg | INTRAVENOUS | Status: AC | PRN
  Administered 2022-12-29 (×2): 2 mg via INTRAVENOUS
  Filled 2022-12-29: qty 1

## 2022-12-29 MED ORDER — SODIUM CHLORIDE 0.9% FLUSH
10.0000 mL | Freq: Once | INTRAVENOUS | Status: AC
  Administered 2022-12-29: 10 mL via INTRAVENOUS

## 2022-12-29 MED ORDER — ENOXAPARIN SODIUM 40 MG/0.4ML IJ SOSY
40.0000 mg | PREFILLED_SYRINGE | INTRAMUSCULAR | Status: DC
  Administered 2022-12-29 – 2023-01-02 (×5): 40 mg via SUBCUTANEOUS
  Filled 2022-12-29 (×5): qty 0.4

## 2022-12-29 MED ORDER — METOPROLOL TARTRATE 50 MG PO TABS
50.0000 mg | ORAL_TABLET | Freq: Two times a day (BID) | ORAL | Status: DC
  Filled 2022-12-29 (×4): qty 1

## 2022-12-29 MED ORDER — HYDROMORPHONE HCL 1 MG/ML IJ SOLN
0.5000 mg | Freq: Once | INTRAMUSCULAR | Status: AC
  Administered 2022-12-29: 0.5 mg via INTRAVENOUS
  Filled 2022-12-29: qty 0.5

## 2022-12-29 MED ORDER — ALBUTEROL SULFATE (2.5 MG/3ML) 0.083% IN NEBU: 2.5000 mg | INHALATION_SOLUTION | Freq: Four times a day (QID) | RESPIRATORY_TRACT | Status: DC | PRN

## 2022-12-29 MED ORDER — HYDROMORPHONE HCL 1 MG/ML IJ SOLN: 1.0000 mg | Freq: Once | INTRAMUSCULAR | Status: DC

## 2022-12-29 MED ORDER — SODIUM CHLORIDE 0.9% FLUSH
10.0000 mL | Freq: Three times a day (TID) | INTRAVENOUS | Status: DC
  Administered 2022-12-30 – 2023-01-03 (×12): 10 mL via INTRAPLEURAL

## 2022-12-29 MED ORDER — BUSPIRONE HCL 5 MG PO TABS
5.0000 mg | ORAL_TABLET | Freq: Three times a day (TID) | ORAL | Status: DC
  Administered 2022-12-29 – 2023-01-03 (×14): 5 mg via ORAL
  Filled 2022-12-29 (×14): qty 1

## 2022-12-29 MED ORDER — DIAZEPAM 5 MG/ML IJ SOLN
2.5000 mg | Freq: Once | INTRAMUSCULAR | Status: AC
  Administered 2022-12-29: 2.5 mg via INTRAVENOUS
  Filled 2022-12-29: qty 2

## 2022-12-29 MED ORDER — TRAZODONE HCL 100 MG PO TABS
100.0000 mg | ORAL_TABLET | Freq: Every evening | ORAL | Status: DC | PRN
  Administered 2022-12-30 – 2023-01-02 (×4): 100 mg via ORAL
  Filled 2022-12-29 (×4): qty 1

## 2022-12-29 MED ORDER — MORPHINE SULFATE (PF) 2 MG/ML IV SOLN
2.0000 mg | Freq: Once | INTRAVENOUS | Status: AC
  Administered 2022-12-29: 2 mg via INTRAVENOUS
  Filled 2022-12-29: qty 1

## 2022-12-29 MED ORDER — POLYETHYLENE GLYCOL 3350 17 G PO PACK: 17.0000 g | PACK | Freq: Every day | ORAL | Status: DC | PRN

## 2022-12-29 MED ORDER — LIDOCAINE HCL (PF) 2 % IJ SOLN
INTRAMUSCULAR | Status: AC
  Filled 2022-12-29: qty 5

## 2022-12-29 MED ORDER — LIDOCAINE 5 % EX PTCH
1.0000 | MEDICATED_PATCH | CUTANEOUS | Status: DC
  Administered 2023-01-01 – 2023-01-02 (×2): 1 via TRANSDERMAL
  Filled 2022-12-29 (×4): qty 1

## 2022-12-29 MED ORDER — MORPHINE SULFATE (PF) 2 MG/ML IV SOLN
INTRAVENOUS | Status: AC
  Filled 2022-12-29: qty 1

## 2022-12-29 MED ORDER — OXYCODONE HCL ER 10 MG PO T12A
20.0000 mg | EXTENDED_RELEASE_TABLET | Freq: Two times a day (BID) | ORAL | Status: DC
  Administered 2022-12-29 – 2023-01-03 (×10): 20 mg via ORAL
  Filled 2022-12-29 (×10): qty 2

## 2022-12-29 MED ORDER — DOCUSATE SODIUM 100 MG PO CAPS: 100.0000 mg | ORAL_CAPSULE | Freq: Two times a day (BID) | ORAL | Status: DC | PRN

## 2022-12-29 NOTE — Progress Notes (Signed)
[facility] CSW Progress Note  Clinical Social Worker  unable to assess patient as he was sent emergently to the ED.  CSW to continue to follow.        Rachel Moulds, LCSW Clinical Social Worker [facility]    Patient is participating in a Managed Medicaid Plan:  Yes

## 2022-12-29 NOTE — ED Notes (Addendum)
Mepilex placed to sacrum. Stage 2 pin point size pressure injury noted to coccyx.

## 2022-12-29 NOTE — H&P (Signed)
NAME:  KAZIAH KOTARSKI, MRN:  [id number], DOB:  1961/01/27, LOS: 0 ADMISSION DATE:  12/29/2022, CONSULTATION DATE:  12/29/22 REFERRING MD:  Estell Harpin [facility] EM, CHIEF COMPLAINT:  recurrent L ptx    History of Present Illness:  62 yo M PMH extensive stage small cell lung cancer, recent ptx requiring chest tube 5/24-28, ILD, chronic hypoxic resp failure on 6L who presented to [facility] ED with SOB. Pt first was seen at cancer center, where he reported SOB x24 hrs, but had improved. L lung sounds were absent on oncs exam and cxr w recurrence of L ptx. Referred to ED emergently in this setting, and chest tube placed.   PCCM is consulted in this setting.   In chart review of recent hospitalization, looks like slightly complex situation- intermittent air leak w forced exhalation and at times failing clamping trials with incr in ptx size. Ultimately conservative mgmnt was pursued and pt was able to have chest tube removed, with close follow up and plan for admission to [facility] in event of recurrence for EBV evaluation / CVTS consult.   Pertinent  Medical History  Extensive stage small cell lung cancer COPD ILD Chronic hypoxia Anorexia   Significant Hospital Events: Including procedures, antibiotic start and stop dates in addition to other pertinent events   5/24-28 chest tube during admission at [facility] for ptx, dc home 5/28 5/30 recurrence of L ptx found at onc appointment. To [facility] ED, chest tube placed. Transfer to [facility]. PCCM consult   Interim History / Subjective:  Chest tube placed   Objective   Blood pressure 102/70, pulse (!) 102, temperature (!) 97.4 F (36.3 C), temperature source Oral, resp. rate (!) 21, height 6' (1.829 m), weight 59.5 kg, SpO2 100 %.       No intake or output data in the 24 hours ending 12/29/22 1411 Filed Weights   12/29/22 1210  Weight: 59.5 kg    Examination: General acute on chronic ill-appearing cachectic middle-aged male lying in bed in no acute distress HEENT: [place]/AT, MM  pink/moist, PERRL,  Neuro: Oriented x 3, nonfocal CV: s1s2 regular rate and rhythm, no murmur, rubs, or gallops,  PULM: Clear to auscultation bilaterally, no increased work of breathing, no added breath sounds, left pigtail chest tube in place GI: soft, bowel sounds active in all 4 quadrants, non-tender, non-distended, tolerating oral diet Extremities: warm/dry, no edema  Skin: no rashes or lesions  Resolved Hospital Problem list     Assessment & Plan:   Recurrent L ptx, secondary Extensive stage small cell lung cancer  -Currently undergoing chemotherapy Chronic hypoxic resp failure  History of COPD, pulmonary fibrosis -On 6L [place] at baseline  ILD P Consider TCTS consult for endobronchial valve eval  CT chest Chest tube to sxn, flush per protocol  Routine chest tube care Continue supplemental oxygen for sat goal greater than 90% Mobilize as able Pulmonary hygiene Repeat chest x-ray in a.m. N.p.o. at midnight pending possible bronchoscopy  Essential hypertension P: Continue home beta-blocker Continuous telemetry  Chronic pain -Utilizes both OxyContin and breakthrough oxycodone at baseline P: Continue home medications  Depression/anxiety P: Continue home BuSpar and duloxetine   Best Practice (right click and "Reselect all SmartList Selections" daily)   Diet/type: Regular consistency (see orders) DVT prophylaxis: SCD GI prophylaxis: PPI Lines: N/A Foley:  N/A Code Status:  full code Last date of multidisciplinary goals of care discussion continue to update patient daily  Labs   CBC: Recent Labs  Lab 12/23/22 1021  12/24/22 0353 12/25/22 0350 12/26/22 0407 12/27/22 0420 12/29/22 0931  WBC 11.3* 8.8 13.6* 11.6* 10.9* 7.1  NEUTROABS 9.8*  --   --   --   --  5.7  HGB 13.9 12.5* 11.2* 12.0* 11.8* 12.5*  HCT 44.2 40.2 36.4* 38.7* 38.1* 38.2*  MCV 91.9 93.9 92.9 94.2 93.6 89.9  PLT 314 287 273 277 220 184    Basic Metabolic Panel: Recent Labs  Lab  12/23/22 1153 12/24/22 0353 12/25/22 0350 12/26/22 0407 12/27/22 0420 12/29/22 0931  NA  --  136 136 139 134* 134*  K  --  4.5 4.6 4.5 4.4 4.0  CL  --  99 98 99 96* 97*  CO2  --  28 32 34* 32 27  GLUCOSE  --  147* 125* 90 102* 122*  BUN  --  17 20 27* 26* 12  CREATININE  --  0.82 0.77 0.73 0.71 0.57*  CALCIUM  --  9.1 9.2 9.3 9.1 9.4  MG 1.9  --   --   --   --  1.9  PHOS 3.8  --   --   --   --   --    GFR: Estimated Creatinine Clearance: 82.6 mL/min (A) (by C-G formula based on SCr of 0.57 mg/dL (L)). Recent Labs  Lab 12/23/22 1021 12/23/22 1123 12/24/22 0353 12/25/22 0350 12/26/22 0407 12/27/22 0420 12/29/22 0931  WBC 11.3*  --    < > 13.6* 11.6* 10.9* 7.1  LATICACIDVEN 1.9 1.0  --   --   --   --   --    < > = values in this interval not displayed.    Liver Function Tests: Recent Labs  Lab 12/23/22 1021 12/29/22 0931  AST 25 21  ALT 23 45*  ALKPHOS 68 63  BILITOT 0.9 0.6  PROT 8.6* 8.1  ALBUMIN 3.7 3.6   No results for input(s): "LIPASE", "AMYLASE" in the last 168 hours. No results for input(s): "AMMONIA" in the last 168 hours.  ABG    Component Value Date/Time   PHART 7.47 (H) 11/08/2022 1751   PCO2ART 44 11/08/2022 1751   PO2ART 71 (L) 11/08/2022 1751   HCO3 31.3 (H) 11/08/2022 1751   TCO2 24 01/24/2018 1940   O2SAT 95.8 11/08/2022 1751     Coagulation Profile: Recent Labs  Lab 12/24/22 0353  INR 1.0    Cardiac Enzymes: No results for input(s): "CKTOTAL", "CKMB", "CKMBINDEX", "TROPONINI" in the last 168 hours.  HbA1C: HB A1C (BAYER DCA - WAIVED)  Date/Time Value Ref Range Status  05/18/2021 11:38 AM 5.6 4.8 - 5.6 % Final    Comment:             Prediabetes: 5.7 - 6.4          Diabetes: >6.4          Glycemic control for adults with diabetes: <7.0               **Please note reference interval change**   02/03/2016 03:40 PM 5.6 <7.0 % Final    Comment:                                          Diabetic Adult            <7.0  Healthy Adult        4.3 - 5.7                                                           (DCCT/NGSP) American Diabetes Association's Summary of Glycemic Recommendations for Adults with Diabetes: Hemoglobin A1c <7.0%. More stringent glycemic goals (A1c <6.0%) may further reduce complications at the cost of increased risk of hypoglycemia.     CBG: Recent Labs  Lab 12/24/22 0856 12/26/22 0800 12/27/22 0839  GLUCAP 221* 75 132*    Review of Systems:   Please see the history of present illness. All other systems reviewed and are negative \  Past Medical History:  He,  has a past medical history of ALS (amyotrophic lateral sclerosis) (HCC), Asthma, Back pain, COPD (chronic obstructive pulmonary disease) (HCC), Coronary artery calcification seen on CT scan, DDD (degenerative disc disease), lumbar, Depression, Dysphagia, History of blood transfusion (2014), History of lung cancer, History of stroke (2015), Idiopathic pulmonary fibrosis (HCC), Migraine, Port-A-Cath in place (12/15/2022), and Spider bite (2012).   Surgical History:   Past Surgical History:  Procedure Laterality Date   ANTERIOR CERVICAL DECOMP/DISCECTOMY FUSION  12/22/2011   Procedure: ANTERIOR CERVICAL DECOMPRESSION/DISCECTOMY FUSION 3 LEVELS;  Surgeon: Venita Lick, MD;  Location: [facility] OR;  Service: Orthopedics;  Laterality: N/A;  ACDF C5-T1   ANTERIOR CERVICAL DECOMP/DISCECTOMY FUSION  06/25/2012   Procedure: ANTERIOR CERVICAL DECOMPRESSION/DISCECTOMY FUSION 1 LEVEL/HARDWARE REMOVAL;  Surgeon: Kerrin Champagne, MD;  Location: [facility] OR;  Service: Orthopedics;  Laterality: N/A;  Removal anterior cervical plate Z6-X0, Explore Fusion, Left C5-6, C6-7, C7-T1 Re-do Foraminotomy, Left open carpal tunnel release with release of ulnar nerve at [facility], Posterior Cervical Fusion with lateral mass screw   ANTERIOR CERVICAL DECOMP/DISCECTOMY FUSION N/A 03/25/2021   Procedure: ANTERIOR CERVICAL  DECOMPRESSION/DISCECTOMY FUSION 2 LEVELS  C3-5;  Surgeon: Venita Lick, MD;  Location: [facility] OR;  Service: Orthopedics;  Laterality: N/A;  ANTERIOR CERVICAL DECOMPRESSION/DISCECTOMY FUSION 2 LEVELS  C3-5   BACK SURGERY     CARDIAC CATHETERIZATION     CARPAL TUNNEL RELEASE  06/25/2012   Procedure: CARPAL TUNNEL RELEASE;  Surgeon: Kerrin Champagne, MD;  Location: [facility] OR;  Service: Orthopedics;  Laterality: Left;  as above   CHOLECYSTECTOMY  2009   CORONARY ANGIOPLASTY  2001   [place]   IR IMAGING GUIDED PORT INSERTION  12/14/2022   LUMBAR FUSION  2000   POSTERIOR CERVICAL FUSION/FORAMINOTOMY  06/25/2012   Procedure: POSTERIOR CERVICAL FUSION/FORAMINOTOMY LEVEL 1;  Surgeon: Kerrin Champagne, MD;  Location: [facility] OR;  Service: Orthopedics;  Laterality: N/A;  as above   POSTERIOR CERVICAL FUSION/FORAMINOTOMY N/A 12/17/2013   Procedure: REMOVAL OF POSTERIOR CERVICAL FUSION LATERAL MASS SCREWS AND RODS C5-T1, EXPLORE FUSION, LEFT SIX-SEVEN FORAMINOTOMY;  Surgeon: Kerrin Champagne, MD;  Location: [facility] OR;  Service: Orthopedics;  Laterality: N/A;   VASECTOMY  1990     Social History:   reports that he quit smoking about 7 years ago. His smoking use included cigarettes. He has a 30.00 pack-year smoking history. He has been exposed to tobacco smoke. He has never used smokeless tobacco. He reports that he does not drink alcohol and does not use drugs.   Family History:  His family history includes ALS in his mother and sister; Cancer in  his brother, mother, and sister. There is no history of Anesthesia problems.   Allergies Allergies  Allergen Reactions   Pirfenidone Nausea And Vomiting and Other (See Comments)    Weight loss     Home Medications  Prior to Admission medications   Medication Sig Start Date End Date Taking? Authorizing Provider  albuterol (PROVENTIL) (2.5 MG/3ML) 0.083% nebulizer solution Take 3 mLs (2.5 mg total) by nebulization every 6 (six) hours as needed for wheezing or shortness of breath.  11/24/22   Glenford Bayley, NP  albuterol (VENTOLIN HFA) 108 (90 Base) MCG/ACT inhaler Inhale 2 puffs into the lungs every 6 (six) hours as needed for wheezing or shortness of breath. 11/24/22   Glenford Bayley, NP  benzonatate (TESSALON) 200 MG capsule Take 1 capsule (200 mg total) by mouth 3 (three) times daily as needed. Patient taking differently: Take 200 mg by mouth 3 (three) times daily as needed for cough. 11/18/22   [name], FNP  blood glucose meter kit and supplies Dispense based on patient and insurance preference. Use up to four times daily as directed. (FOR ICD-10 E10.9, E11.9). 05/18/21   [name], FNP  busPIRone (BUSPAR) 5 MG tablet Take 1 tablet (5 mg total) by mouth 3 (three) times daily. 08/31/21   [name] A, FNP  CARBOPLATIN IV Inject into the vein every 21 ( twenty-one) days. 12/19/22   [provider]  DULoxetine (CYMBALTA) 60 MG capsule Take 1 capsule (60 mg total) by mouth daily. 09/01/22   [name], FNP  DURVALUMAB IV Inject into the vein every 21 ( twenty-one) days. 12/19/22   [provider]  Ensure Max Protein (ENSURE MAX PROTEIN) LIQD Take 330 mLs by mouth 3 (three) times daily. 11/18/22   [name], FNP  ETOPOSIDE IV Inject into the vein every 21 ( twenty-one) days. 12/19/22   [provider]  famotidine (PEPCID) 20 MG tablet Take 1 tablet (20 mg total) by mouth 2 (two) times daily. 12/20/22   Doreatha Massed, MD  fexofenadine (ALLEGRA) 180 MG tablet Take 1 tablet (180 mg total) by mouth daily. 02/28/22   [name] A, FNP  ibuprofen (ADVIL) 800 MG tablet Take 1 tablet (800 mg total) by mouth every 8 (eight) hours as needed. 11/22/22   [name], FNP  lidocaine-prilocaine (EMLA) cream Apply a small amount to port a cath site and cover with plastic wrap 1 hour prior to infusion appointments 12/15/22   Doreatha Massed, MD  magic mouthwash w/lidocaine SOLN Take 10 mLs by mouth 3 (three) times daily  as needed for mouth pain. 11/18/22   [name], FNP  megestrol (MEGACE) 400 MG/10ML suspension Take 10 mLs (400 mg total) by mouth 2 (two) times daily. 12/19/22   Doreatha Massed, MD  metoprolol tartrate (LOPRESSOR) 50 MG tablet Take 0.5 tablets (25 mg total) by mouth 2 (two) times daily. 11/11/22   Cleora Fleet, MD  Misc. Devices MISC Patient require 6L Nasal Cannula Oxygen 12/13/22   Doreatha Massed, MD  naloxone [facility]) nasal spray 4 mg/0.1 mL Place 1 spray into the nose as needed (opioid overdose).    [provider]  Nintedanib (OFEV) 150 MG CAPS Take 1 capsule (150 mg total) by mouth 2 (two) times daily. 11/29/22   Glenford Bayley, NP  nortriptyline (PAMELOR) 50 MG capsule Take 50 mg by mouth 2 (two) times daily. 11/28/16   [provider]  oxyCODONE (OXYCONTIN) 20 mg 12 hr  tablet Take 1 tablet (20 mg total) by mouth every 12 (twelve) hours. 12/19/22   Doreatha Massed, MD  oxyCODONE (ROXICODONE) 15 MG immediate release tablet Take 15 mg by mouth 4 (four) times daily. 06/22/18   [provider]  prochlorperazine (COMPAZINE) 10 MG tablet Take 1 tablet (10 mg total) by mouth every 6 (six) hours as needed for nausea or vomiting. 12/15/22   Doreatha Massed, MD  traZODone (DESYREL) 50 MG tablet Take 2 tablets (100 mg total) by mouth at bedtime. Patient taking differently: Take 100 mg by mouth at bedtime as needed for sleep. 11/11/22   Cleora Fleet, MD     Critical care time:  NA  Nieves Chapa D. Harris, NP-C [facility] Personal contact information can be found on Amion  If no contact or response made please call [phone] 12/29/2022, 6:46 PM

## 2022-12-29 NOTE — Consult Note (Signed)
[facility] History and Physical  Reason for Referral: Recurrent left ptx  Referring Physician: Dr. Estell Harpin   Chief Complaint   Lung Collapsed      Bradley Rice is a 62 y.o. male.  HPI: Bradley Rice is a 62 yo with lung cancer, pulmonary fibrosis and recent admission for pneumothorax. He had a chest tube removed 5/28 and discharge summary reports high risk of recurrence in the notes.  He developed chest pain, SOB, and tachycardia. He had a CXR done by the cancer center today and was noted to have a large pneumothorax. I was asked to place a chest tube.   I told the ED he needed pulmonary involved given likely need for pleurodesis given recurrence if this does not improve.   Past Medical History:  Diagnosis Date   ALS (amyotrophic lateral sclerosis) (HCC)    Asthma    Back pain    COPD (chronic obstructive pulmonary disease) (HCC)    Coronary artery calcification seen on CT scan    DDD (degenerative disc disease), lumbar    Depression    Dysphagia    Following previous surgery   History of blood transfusion 2014   Post-op bleed   History of lung cancer    History of stroke 2015   Idiopathic pulmonary fibrosis (HCC)    Migraine    Port-A-Cath in place 12/15/2022   Spider bite 2012    Past Surgical History:  Procedure Laterality Date   ANTERIOR CERVICAL DECOMP/DISCECTOMY FUSION  12/22/2011   Procedure: ANTERIOR CERVICAL DECOMPRESSION/DISCECTOMY FUSION 3 LEVELS;  Surgeon: Venita Lick, MD;  Location: [facility] OR;  Service: Orthopedics;  Laterality: N/A;  ACDF C5-T1   ANTERIOR CERVICAL DECOMP/DISCECTOMY FUSION  06/25/2012   Procedure: ANTERIOR CERVICAL DECOMPRESSION/DISCECTOMY FUSION 1 LEVEL/HARDWARE REMOVAL;  Surgeon: Kerrin Champagne, MD;  Location: [facility] OR;  Service: Orthopedics;  Laterality: N/A;  Removal anterior cervical plate Z6-X0, Explore Fusion, Left C5-6, C6-7, C7-T1 Re-do Foraminotomy, Left open carpal tunnel release with release of ulnar nerve at [facility],  Posterior Cervical Fusion with lateral mass screw   ANTERIOR CERVICAL DECOMP/DISCECTOMY FUSION N/A 03/25/2021   Procedure: ANTERIOR CERVICAL DECOMPRESSION/DISCECTOMY FUSION 2 LEVELS  C3-5;  Surgeon: Venita Lick, MD;  Location: [facility] OR;  Service: Orthopedics;  Laterality: N/A;  ANTERIOR CERVICAL DECOMPRESSION/DISCECTOMY FUSION 2 LEVELS  C3-5   BACK SURGERY     CARDIAC CATHETERIZATION     CARPAL TUNNEL RELEASE  06/25/2012   Procedure: CARPAL TUNNEL RELEASE;  Surgeon: Kerrin Champagne, MD;  Location: [facility] OR;  Service: Orthopedics;  Laterality: Left;  as above   CHOLECYSTECTOMY  2009   CORONARY ANGIOPLASTY  2001   [place]   IR IMAGING GUIDED PORT INSERTION  12/14/2022   LUMBAR FUSION  2000   POSTERIOR CERVICAL FUSION/FORAMINOTOMY  06/25/2012   Procedure: POSTERIOR CERVICAL FUSION/FORAMINOTOMY LEVEL 1;  Surgeon: Kerrin Champagne, MD;  Location: [facility] OR;  Service: Orthopedics;  Laterality: N/A;  as above   POSTERIOR CERVICAL FUSION/FORAMINOTOMY N/A 12/17/2013   Procedure: REMOVAL OF POSTERIOR CERVICAL FUSION LATERAL MASS SCREWS AND RODS C5-T1, EXPLORE FUSION, LEFT SIX-SEVEN FORAMINOTOMY;  Surgeon: Kerrin Champagne, MD;  Location: [facility] OR;  Service: Orthopedics;  Laterality: N/A;   VASECTOMY  1990    Family History  Problem Relation Age of Onset   ALS Mother    Cancer Mother    Cancer Sister    ALS Sister    Cancer Brother        1 brother  Anesthesia problems Neg Hx     Social History   Tobacco Use   Smoking status: Former    Packs/day: 1.00    Years: 30.00    Additional pack years: 0.00    Total pack years: 30.00    Types: Cigarettes    Quit date: 10/2015    Years since quitting: 7.1    Passive exposure: Current   Smokeless tobacco: Never   Tobacco comments:    Patient denies current smoking.  Patient lives with wife who is a smoker. Updated 11/24/2022. am  Vaping Use   Vaping Use: Never used  Substance Use Topics   Alcohol use: No    Alcohol/week: 0.0 standard drinks of alcohol    Drug use: No    Medications: I have reviewed the patient's current medications. No current facility-administered medications for this encounter.   Current Outpatient Medications  Medication Sig Dispense Refill Last Dose   albuterol (PROVENTIL) (2.5 MG/3ML) 0.083% nebulizer solution Take 3 mLs (2.5 mg total) by nebulization every 6 (six) hours as needed for wheezing or shortness of breath. 150 mL 1    albuterol (VENTOLIN HFA) 108 (90 Base) MCG/ACT inhaler Inhale 2 puffs into the lungs every 6 (six) hours as needed for wheezing or shortness of breath. 1 each 5    benzonatate (TESSALON) 200 MG capsule Take 1 capsule (200 mg total) by mouth 3 (three) times daily as needed. (Patient taking differently: Take 200 mg by mouth 3 (three) times daily as needed for cough.) 90 capsule 2    blood glucose meter kit and supplies Dispense based on patient and insurance preference. Use up to four times daily as directed. (FOR ICD-10 E10.9, E11.9). 1 each 0    busPIRone (BUSPAR) 5 MG tablet Take 1 tablet (5 mg total) by mouth 3 (three) times daily. 90 tablet 1    CARBOPLATIN IV Inject into the vein every 21 ( twenty-one) days.      DULoxetine (CYMBALTA) 60 MG capsule Take 1 capsule (60 mg total) by mouth daily. 90 capsule 1    DURVALUMAB IV Inject into the vein every 21 ( twenty-one) days.      Ensure Max Protein (ENSURE MAX PROTEIN) LIQD Take 330 mLs by mouth 3 (three) times daily. 29700 mL 2    ETOPOSIDE IV Inject into the vein every 21 ( twenty-one) days.      famotidine (PEPCID) 20 MG tablet Take 1 tablet (20 mg total) by mouth 2 (two) times daily. 60 tablet 3    fexofenadine (ALLEGRA) 180 MG tablet Take 1 tablet (180 mg total) by mouth daily. 90 tablet 1    ibuprofen (ADVIL) 800 MG tablet Take 1 tablet (800 mg total) by mouth every 8 (eight) hours as needed. 30 tablet 0    lidocaine-prilocaine (EMLA) cream Apply a small amount to port a cath site and cover with plastic wrap 1 hour prior to infusion  appointments 30 g 3    magic mouthwash w/lidocaine SOLN Take 10 mLs by mouth 3 (three) times daily as needed for mouth pain. 250 mL 1    megestrol (MEGACE) 400 MG/10ML suspension Take 10 mLs (400 mg total) by mouth 2 (two) times daily. 480 mL 2    metoprolol tartrate (LOPRESSOR) 50 MG tablet Take 0.5 tablets (25 mg total) by mouth 2 (two) times daily. 60 tablet 1    Misc. Devices MISC Patient require 6L Nasal Cannula Oxygen 99 each 99    naloxone (NARCAN) nasal spray 4 mg/0.1  mL Place 1 spray into the nose as needed (opioid overdose).      Nintedanib (OFEV) 150 MG CAPS Take 1 capsule (150 mg total) by mouth 2 (two) times daily. 180 capsule 1    nortriptyline (PAMELOR) 50 MG capsule Take 50 mg by mouth 2 (two) times daily.      oxyCODONE (OXYCONTIN) 20 mg 12 hr tablet Take 1 tablet (20 mg total) by mouth every 12 (twelve) hours. 60 tablet 0    oxyCODONE (ROXICODONE) 15 MG immediate release tablet Take 15 mg by mouth 4 (four) times daily.  0    prochlorperazine (COMPAZINE) 10 MG tablet Take 1 tablet (10 mg total) by mouth every 6 (six) hours as needed for nausea or vomiting. 60 tablet 4    traZODone (DESYREL) 50 MG tablet Take 2 tablets (100 mg total) by mouth at bedtime. (Patient taking differently: Take 100 mg by mouth at bedtime as needed for sleep.)        Allergies  Allergen Reactions   Pirfenidone Nausea And Vomiting and Other (See Comments)    Weight loss      ROS:  A comprehensive review of systems was negative except for: Respiratory: positive for SOB Cardiovascular: positive for chest pain and tachycardia  Blood pressure 106/76, pulse (!) 102, temperature 98 F (36.7 C), temperature source Oral, resp. rate 20, height 6' (1.829 m), weight 59.5 kg, SpO2 100 %. Physical Exam Constitutional:      Appearance: He is cachectic.  HENT:     Head: Normocephalic.     Mouth/Throat:     Mouth: Mucous membranes are moist.  Cardiovascular:     Rate and Rhythm: Normal rate.  Pulmonary:      Comments: Increased work of breathing, old chest tube site healed, no erythema or drainage Abdominal:     General: There is no distension.     Palpations: Abdomen is soft.  Musculoskeletal:        General: Normal range of motion.     Cervical back: Normal range of motion.  Skin:    General: Skin is warm.  Neurological:     General: No focal deficit present.     Mental Status: He is alert.  Psychiatric:        Mood and Affect: Mood normal.     Results: Results for orders placed or performed in visit on 12/29/22 (from the past 48 hour(s))  CBC with Differential     Status: Abnormal   Collection Time: 12/29/22  9:31 AM  Result Value Ref Range   WBC 7.1 4.0 - 10.5 K/uL   RBC 4.25 4.22 - 5.81 MIL/uL   Hemoglobin 12.5 (L) 13.0 - 17.0 g/dL   HCT 16.1 (L) 09.6 - 04.5 %   MCV 89.9 80.0 - 100.0 fL   MCH 29.4 26.0 - 34.0 pg   MCHC 32.7 30.0 - 36.0 g/dL   RDW 40.9 81.1 - 91.4 %   Platelets 184 150 - 400 K/uL   nRBC 0.0 0.0 - 0.2 %   Neutrophils Relative % 79 %   Neutro Abs 5.7 1.7 - 7.7 K/uL   Lymphocytes Relative 14 %   Lymphs Abs 1.0 0.7 - 4.0 K/uL   Monocytes Relative 4 %   Monocytes Absolute 0.3 0.1 - 1.0 K/uL   Eosinophils Relative 2 %   Eosinophils Absolute 0.1 0.0 - 0.5 K/uL   Basophils Relative 1 %   Basophils Absolute 0.1 0.0 - 0.1 K/uL   Immature Granulocytes 0 %  Abs Immature Granulocytes 0.03 0.00 - 0.07 K/uL    Comment: Performed at [facility], [address] [place], [place] [address]  Comprehensive metabolic panel     Status: Abnormal   Collection Time: 12/29/22  9:31 AM  Result Value Ref Range   Sodium 134 (L) 135 - 145 mmol/L   Potassium 4.0 3.5 - 5.1 mmol/L   Chloride 97 (L) 98 - 111 mmol/L   CO2 27 22 - 32 mmol/L   Glucose, Bld 122 (H) 70 - 99 mg/dL    Comment: Glucose reference range applies only to samples taken after fasting for at least 8 hours.   BUN 12 6 - 20 mg/dL   Creatinine, Ser 6.04 (L) 0.61 - 1.24 mg/dL   Calcium 9.4 8.9 - 54.0 mg/dL    Total Protein 8.1 6.5 - 8.1 g/dL   Albumin 3.6 3.5 - 5.0 g/dL   AST 21 15 - 41 U/L   ALT 45 (H) 0 - 44 U/L   Alkaline Phosphatase 63 38 - 126 U/L   Total Bilirubin 0.6 0.3 - 1.2 mg/dL   GFR, Estimated >98 >11 mL/min    Comment: (NOTE) Calculated using the CKD-EPI Creatinine Equation (2021)    Anion gap 10 5 - 15    Comment: Performed at [facility], [address] [place], [place] [address]  Magnesium     Status: None   Collection Time: 12/29/22  9:31 AM  Result Value Ref Range   Magnesium 1.9 1.7 - 2.4 mg/dL    Comment: Performed at [facility], [address], [place], [place] [address]   Personally reviewed CXR- large ptx resolved with chest tube placement  DG Chest Portable 1 View  Result Date: 12/29/2022 CLINICAL DATA:  chest tube EXAM: PORTABLE CHEST 1 VIEW COMPARISON:  CXR 12/27/22 FINDINGS: Left-sided thoracostomy tube in place without a radiographically discernible pneumothorax. No pleural effusion. Unchanged cardiac and mediastinal contours. Left-sided needle accessed port in place with the tip near the cavoatrial junction. Unchanged nodular opacity at the right lung base. Unchanged extensive bilateral interstitial opacities, favored to represent sequela fibrotic lung disease. No radiographically apparent displaced rib fractures. IMPRESSION: Left-sided thoracostomy tube in place without a radiographically discernible pneumothorax. Electronically Signed   By: Lorenza Cambridge M.D.   On: 12/29/2022 13:19   DG Chest 2 View  Result Date: 12/29/2022 CLINICAL DATA:  Worsening shortness of breath, concern for recurrent pneumothorax. EXAM: CHEST - 2 VIEW COMPARISON:  Two days ago FINDINGS: Large left pneumothorax, over 50% with left diaphragm depression. A left chest tube has been removed. Known right lower lobe mass. Extensive interstitial opacity from pulmonary fibrosis and emphysema. Left-sided porta catheter with tip at the upper right atrium. Normal heart size. There is mediastinal  adenopathy less well appreciated when compared to recent CT. Critical Value/emergent results were called by telephone at the time of interpretation on 12/29/2022 at 12:31 pm to provider Dr. Estell Harpin, who verbally acknowledged these results. IMPRESSION: Large left pneumothorax with diaphragm depression. Electronically Signed   By: Tiburcio Pea M.D.   On: 12/29/2022 12:31     Assessment & Plan:  Bradley Rice is a 62 y.o. male with large symptomatic pneumothorax. Chest tube placed in the ED. CXR with resolution.   See procedure note. Airleak present at time of placement Transfer pending to [place]  All questions were answered to the satisfaction of the patient and family.    Lucretia Roers 12/29/2022, 4:22 PM

## 2022-12-29 NOTE — Procedures (Addendum)
[facility] Procedure Note  12/29/22  Pre-procedure Diagnosis: Recurrent left pneumothorax    Post-procedure Diagnosis: Same   Procedure(s) Performed: Left pigtail chest tube placement    Surgeon: Leatrice Jewels. Henreitta Leber, MD   Assistants: No qualified resident was available    Anesthesia: Lidocaine 1%    Specimens: None    Estimated Blood Loss: Minimal  Wound Class: Clean    Procedure Indications: Mr. Bradley Rice is a 62 yo who has a recurrent left pneumothorax after just getting a chest tube placed 5/24 and removed 5/28. He was in the hospital and discharged on 5/28. He has small cell lung cancer and also pulmonary fibrosis. He is on active chemotherapy. It was thought his first PTX was a spontaneous one from coughing based not he notes. He has worsening SOB, chest pain and tachycardia. CXR shows over 50% pneumothorax on the left.   Findings:  CXR with large PTX Air leak at time of placement No fluid  Condensation in the tubing   Procedure: The patient was taken to the procedure room and placed upright on the ED bed. The left chest was prepared and draped in the usual sterile fashion. Lidocaine 1% was injected anterior to the prior chest tube site and at the nipple midline.  A syringe was used to draw back air and a wire was placed in the standard Seldinger technique. The [place] catheter over the dilator was placed over the wire. Wire and dilator were removed.  Condensation was noted in the tubing and was hooked to the chest tube collection system.  The chest tube was secured with all the holes inside the chest. A sterile dressing was placed.   CXR was obtained and the Ptx appeared resolved.   Final inspection revealed acceptable hemostasis. The patient tolerated the procedure well.   Due to the recurrent PTX, I advised the ED to get Pulmonary involved send him to [facility] for further management given potential need for talc or some other pleurodesis.   Algis Greenhouse, MD [facility] [address] [place], [place] [address] [phone] (office)

## 2022-12-29 NOTE — Progress Notes (Signed)
[facility] [address], [place] [address] Phone: [phone] Fax: [phone]  SYMPTOM MANAGEMENT CLINIC PROGRESS NOTE   Bradley Rice [id number] 1961-03-23 62 y.o.   NOTE: Patient sent to Emergency Department for stabilization and hospital admission due to recurrent left lung pneumothorax.  Further details as below.    Bradley Rice is managed by Dr. Ellin Saba for extensive stage small cell lung cancer  Actively treated with chemotherapy/immunotherapy/hormonal therapy: YES  Current therapy: Carboplatin + etoposide + Durvalumab  Last treated: Day #1 / Cycle #1 on 12/19/22  INTERVAL HISTORY:  Chief Complaint: Chemotherapy follow-up & symptom management visit  Bradley Rice received his first cycle of carboplatin, etoposide, and Durvalumab on 12/19/2022.  In the interim, he was hospitalized from 12/23/2022 through 12/27/2022 due to left-sided pneumothorax s/p chest tube placement.  Patient opted for conservative management and discharged from hospital with understanding that he was at high risk for recurrent pneumothorax and would need to be admitted at [facility] for cardiothoracic surgery consult if this recurred.  In addition to his extensive stage small cell lung cancer, he has chronic hypoxic respiratory failure secondary to COPD and pulmonary fibrosis and is on 6 L supplemental oxygen at baseline.  At today's visit, he reports that his breathing had initially improved at the time of hospital discharge, but he has had worsening dyspnea over the past 24 hours and has at times been "gasping for breath."  He continues to have dry cough, but denies any hemoptysis or chest pain.  He reports low energy and low appetite, has been eating about 50% of usual oral intake and drinking Ensure twice daily.  He does note some difficulty swallowing and coughing when he tries to drink liquids, ongoing since cancer diagnosis.  Therefore, he has had limited  water intake (24 to 32 ounces daily).  Weight today is 125 pounds (down 4 pounds since first cycle of chemotherapy).  He denies any nausea, vomiting, diarrhea, mouth sores.  No peripheral edema, rash, changes in urine, neuropathy, new onset pain, bruising, bleeding, B symptoms, or vision/hearing changes.  ASSESSMENT & PLAN:  ## EXTENSIVE STAGE SMALL CELL LUNG CANCER - Primary medical oncologist is Dr. 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  Patient opted for conservative management and discharged from hospital with understanding that he was at high risk for recurrent pneumothorax and would need to be admitted at [facility] for cardiothoracic surgery consult if this recurred. - At today's visit, he reports that his breathing had initially improved at the time of hospital discharge, but he has had worsening dyspnea over the past 24 hours and has at times been "gasping for breath."  He continues to have dry cough, but denies any hemoptysis or chest pain. - Absent left lung sounds on exam, tachycardic with heart rate 129, oxygen saturation 95% on 4 L supplemental O2 - Per my personal  interpretation of CXR obtained today (12/29/2022), patient has recurrent left lung pneumothorax - PLAN: Patient escorted to Emergency  Department for emergent stabilization and hospital admission  # Bilateral rib and back pain - Current pain medication includes OxyContin 20 mg twice daily with oxycodone 15 mg as needed for breakthrough pain.    # Decreased appetite # Weight loss - 20 to 30 pound weight loss in the last 4 to 6 months due to decreased appetite and active malignancy - He was prescribed Megace at last appointment, but has not yet started taking this. - Weight today 125 pounds (down 4 pounds since first cycle of chemotherapy on 12/19/2022  PLAN SUMMARY: >> Emergency Department stabilization and hospital admission >> Next scheduled appointment with MD/medical oncologist: 01/09/2023   REVIEW OF SYSTEMS:   Review of Systems  Constitutional:  Positive for activity change, appetite change, fatigue and unexpected weight change. Negative for chills, diaphoresis and fever.  HENT:  Positive for trouble swallowing. Negative for mouth sores, nosebleeds and sore throat.   Respiratory:  Positive for cough and shortness of breath.   Cardiovascular:  Positive for chest pain and palpitations. Negative for leg swelling.  Gastrointestinal:  Negative for abdominal pain, blood in stool, constipation, diarrhea, nausea and vomiting.  Genitourinary:  Negative for dysuria and hematuria.  Neurological:  Negative for dizziness, light-headedness, numbness and headaches.  Psychiatric/Behavioral:  Negative for dysphoric mood and sleep disturbance. The patient is not nervous/anxious.     Past Medical History, Surgical history, Social history, and Family history were reviewed as documented elsewhere in chart, and were updated as appropriate.   OBJECTIVE:  Physical Exam:  There were no vitals taken for this visit. ECOG: 2  Physical Exam Constitutional:      Appearance: Normal appearance. He is cachectic.  Cardiovascular:     Rate and Rhythm: Tachycardia present.     Heart sounds: Normal heart sounds.  Pulmonary:     Breath  sounds: Examination of the right-upper field reveals rhonchi. Examination of the left-upper field reveals decreased breath sounds. Examination of the right-middle field reveals rhonchi. Examination of the left-middle field reveals decreased breath sounds. Examination of the right-lower field reveals rhonchi. Examination of the left-lower field reveals decreased breath sounds. Decreased breath sounds and rhonchi present.  Neurological:     General: No focal deficit present.     Mental Status: Mental status is at baseline.  Psychiatric:        Behavior: Behavior normal. Behavior is cooperative.     Lab Review:     Component Value Date/Time   NA 134 (L) 12/29/2022 0931   NA 146 (H) 11/18/2022 1226   K 4.0 12/29/2022 0931   CL 97 (L) 12/29/2022 0931   CO2 27 12/29/2022 0931   GLUCOSE 122 (H) 12/29/2022 0931   BUN 12 12/29/2022 0931   BUN 11 11/18/2022 1226   CREATININE 0.57 (L) 12/29/2022 0931   CALCIUM 9.4 12/29/2022 0931   PROT 8.1 12/29/2022 0931   PROT 6.5 11/18/2022 1226   ALBUMIN 3.6 12/29/2022 0931   ALBUMIN 3.5 (L) 11/18/2022 1226   AST 21 12/29/2022 0931   ALT 45 (H) 12/29/2022 0931   ALKPHOS 63 12/29/2022 0931   BILITOT 0.6 12/29/2022 0931   BILITOT 0.4 11/18/2022 1226   GFRNONAA >60 12/29/2022 0931   GFRAA >60 12/15/2019 2328       Component Value Date/Time   WBC 7.1 12/29/2022 0931   RBC 4.25 12/29/2022 0931   HGB 12.5 (L) 12/29/2022 0931  HGB 12.8 (L) 11/18/2022 1226   HCT 38.2 (L) 12/29/2022 0931   HCT 39.2 11/18/2022 1226   PLT 184 12/29/2022 0931   PLT 514 (H) 11/18/2022 1226   MCV 89.9 12/29/2022 0931   MCV 91 11/18/2022 1226   MCH 29.4 12/29/2022 0931   MCHC 32.7 12/29/2022 0931   RDW 13.8 12/29/2022 0931   RDW 12.8 11/18/2022 1226   LYMPHSABS 1.0 12/29/2022 0931   LYMPHSABS 1.2 11/18/2022 1226   MONOABS 0.3 12/29/2022 0931   EOSABS 0.1 12/29/2022 0931   EOSABS 0.0 11/18/2022 1226   BASOSABS 0.1 12/29/2022 0931   BASOSABS 0.0 11/18/2022 1226    -------------------------------  Imaging from last 24 hours (if applicable): Radiology interpretation: DG Chest 1 View  Result Date: 12/27/2022 CLINICAL DATA:  Follow-up pneumothorax EXAM: PORTABLE CHEST 1 VIEW COMPARISON:  Film from earlier in the same day. FINDINGS: Cardiac shadow is stable. Left chest wall port is again seen. Slight increase in the left-sided pneumothorax is noted. Chest tube remains in place. Persistent airspace opacities are again noted bilaterally slightly worse on the right than the left. Bony structures appear within normal limits. IMPRESSION: Slight increase in the left-sided pneumothorax when compared with the prior exam. The remainder of the exam is stable from the prior study. Electronically Signed   By: Alcide Clever M.D.   On: 12/27/2022 15:54   DG CHEST PORT 1 VIEW  Result Date: 12/27/2022 CLINICAL DATA:  Left pneumothorax EXAM: PORTABLE CHEST 1 VIEW COMPARISON:  Previous studies including the examination of 12/26/2022 FINDINGS: There is a interval decrease in size of left pneumothorax with small residual left pneumothorax in the periphery of left upper lung field. Left chest tube is noted in place. Transverse diameter of heart is increased. Increased interstitial markings are seen in both lungs. There are patchy densities in right mid and right lower lung fields. Right hilum is more prominent than left. There is blunting of lateral CP angles. Tip of left IJ chest port is seen at the junction of superior vena cava and right atrium. IMPRESSION: There is interval decrease in size of pneumothorax in the periphery of left upper lung field. Electronically Signed   By: Ernie Avena M.D.   On: 12/27/2022 09:16   DG CHEST PORT 1 VIEW  Result Date: 12/26/2022 CLINICAL DATA:  [id number] Chest tube in place [id number] EXAM: PORTABLE CHEST - 1 VIEW COMPARISON:  Earlier film of the same day FINDINGS: Stable left pigtail chest tube directed towards the hilum. Small scratch the  apical and lateral a pneumothorax slightly more conspicuous, the lung apex projecting below the posterior aspect left third rib. Stable right IJ port catheter to the mid right atrium. Masslike opacity at the right lung base stable. Diffuse interstitial lung disease stable. Heart size normal. Aortic Atherosclerosis (ICD10-170.0). Right hilar and paratracheal fullness suggesting adenopathy. No effusion. Visualized bones unremarkable. IMPRESSION: 1. Small left apical and lateral pneumothorax, with chest tube in place. 2. Stable right lower lobe mass, and hilar adenopathy. Electronically Signed   By: Corlis Leak M.D.   On: 12/26/2022 20:33   DG CHEST PORT 1 VIEW  Result Date: 12/26/2022 CLINICAL DATA:  Chest tube in place. EXAM: PORTABLE CHEST 1 VIEW COMPARISON:  Radiograph yesterday FINDINGS: Decreased size of left pneumothorax, lateral component no longer visualized. Small residual at the apex. Left pigtail catheter remains in place. Left chest port remains in place. Otherwise unchanged appearance of the chest with chronic lung disease and right-sided thoracic malignancy. IMPRESSION: Decreased size  of left pneumothorax, small residual. Left pigtail catheter remains in place. Electronically Signed   By: Narda Rutherford M.D.   On: 12/26/2022 13:00   DG CHEST PORT 1 VIEW  Result Date: 12/25/2022 CLINICAL DATA:  Chest tube clamped. EXAM: PORTABLE CHEST 1 VIEW COMPARISON:  Earlier today. FINDINGS: Increased size of left pneumothorax, small to moderate, with increasing lateral component. For example lateral component measures 11 mm at the level of the port, previously 4 mm. Pigtail catheter in the left mid lung again seen. The exam is otherwise unchanged IMPRESSION: Increased size of left pneumothorax, small to moderate, with increasing lateral component. Pigtail catheter remains in place. Electronically Signed   By: Narda Rutherford M.D.   On: 12/25/2022 20:49   DG CHEST PORT 1 VIEW  Result Date:  12/25/2022 CLINICAL DATA:  Chest tube in place, on water seal. EXAM: PORTABLE CHEST 1 VIEW COMPARISON:  Earlier today FINDINGS: Slight increased size of left pneumothorax with chest tube on water seal, still small, however now visualized laterally. Pigtail catheter in place unchanged in positioning. Left chest port remains in place. Chronic lung disease and known right lung malignancy are unchanged. IMPRESSION: Slight increased size of left pneumothorax with chest tube on water seal, still small, however now visualized laterally. Left pigtail catheter in place. Electronically Signed   By: Narda Rutherford M.D.   On: 12/25/2022 20:47   DG CHEST PORT 1 VIEW  Result Date: 12/25/2022 CLINICAL DATA:  Chest tube in place, no pneumothorax. EXAM: PORTABLE CHEST 1 VIEW COMPARISON:  Most recent radiograph yesterday. FINDINGS: Unchanged small left apical pneumothorax from yesterday. Left-sided pigtail catheter remains in place. Left chest port in place. The exam is otherwise unchanged with sequela of right lung malignancy and chronic lung disease. IMPRESSION: 1. Unchanged small left apical pneumothorax with left chest tube in place. 2. Unchanged right lung malignancy and chronic lung disease. Electronically Signed   By: Narda Rutherford M.D.   On: 12/25/2022 10:31   DG Chest Port 1 View  Result Date: 12/24/2022 CLINICAL DATA:  Pneumothorax. EXAM: PORTABLE CHEST 1 VIEW COMPARISON:  Radiograph yesterday FINDINGS: Left-sided pigtail catheter remains in place. Stable size of small left apical pneumothorax. Left chest port remains in place. Known right lung malignancy and chronic lung disease are otherwise unchanged. IMPRESSION: 1. Unchanged small left apical pneumothorax. Left chest tube remains in place. 2. Unchanged right lung malignancy and chronic lung disease. Electronically Signed   By: Narda Rutherford M.D.   On: 12/24/2022 11:16   DG Chest Port 1 View  Result Date: 12/23/2022 CLINICAL DATA:  Shortness of  breath. EXAM: PORTABLE CHEST 1 VIEW COMPARISON:  Chest x-ray Dec 23, 2022. FINDINGS: Left chest tube in place with decreased size of the left pneumothorax, now small. Similar patchy interstitial airspace opacities. No new masses better characterized on recent CT chest. No mediastinal shift. Otherwise, cardiomediastinal silhouette is similar. Left subclavian approach Port-A-Cath with tip in the right atrium. IMPRESSION: Left chest tube in place with decreased size of the left pneumothorax, now small. Electronically Signed   By: Feliberto Harts M.D.   On: 12/23/2022 11:41   DG Chest Port 1 View  Result Date: 12/23/2022 CLINICAL DATA:  sob EXAM: PORTABLE CHEST 1 VIEW COMPARISON:  November 10, 2022. FINDINGS: Large left pneumothorax. Some rightward mediastinal shift. Otherwise, cardiomediastinal silhouette is similar. Left subclavian approach cardiac rhythm maintenance device with the tip projecting at the right atrium. Patchy interstitial and airspace opacities throughout the lungs. Known masses better characterized  on recent CT chest. IMPRESSION: 1. Large left pneumothorax. Some rightward mediastinal shift raises concern for tension physiology component. 2. Known masses better characterized on recent CT chest. 3. Additional patchy interstitial and airspace opacities throughout the lungs could represent changes of emphysema but superimposed infection or aspiration is difficult to exclude. Findings discussed with Dr. Charm Barges via telephone at 10:26 AM. Electronically Signed   By: Feliberto Harts M.D.   On: 12/23/2022 10:28   MR Brain W Wo Contrast  Result Date: 12/16/2022 CLINICAL DATA:  Non-small cell lung cancer, staging a EXAM: MRI HEAD WITHOUT AND WITH CONTRAST TECHNIQUE: Multiplanar, multiecho pulse sequences of the brain and surrounding structures were obtained without and with intravenous contrast. CONTRAST:  6mL GADAVIST GADOBUTROL 1 MMOL/ML IV SOLN COMPARISON:  04/13/2017 FINDINGS: Evaluation is somewhat  limited by motion artifact. Brain: Linear area of restricted diffusion in the posterior right cerebellum, which measures up to 18 x 6 x 8 mm (AP x TR x CC). Additional smaller areas of restricted diffusion with ADC correlates are noted in the bilateral cerebellar hemispheres (series 5, images 64 and 66) and left paramedian pons (series 5, image 64), which are T2 hyperintense and nonenhancing, concerning for acute infarcts. Additional punctate foci of increased diffusivity without definite ADC correlate in the right posterior frontal white matter (series 5, image 88), the left occipital lobe (series 5, image 80), and right caudate tail (series 5, image 81), concerning for subacute infarcts. Diffusion restricting focus with ADC correlate in the anteromedial right frontal cortex (series 5, image 81) correlates with possible contrast enhancement (series 19, image 26 and series 18, image 32), which could represent metastatic disease. No significant associated T2 hyperintense signal. No other abnormal parenchymal enhancement. No acute hemorrhage, mass, mass effect, or midline shift. No hydrocephalus or extra-axial collection. Normal pituitary and craniocervical junction. No hemosiderin deposition to suggest remote hemorrhage. Normal cerebral volume for age. Scattered T2 hyperintense signal in the periventricular white matter, likely the sequela of mild-to-moderate chronic small vessel ischemic disease. Vascular: Normal arterial flow voids. Normal arterial and venous enhancement. Skull and upper cervical spine: Normal marrow signal. Sinuses/Orbits: Air-fluid level in the right maxillary sinus. Mucosal thickening in the left maxillary sinus. No acute finding in the orbits. Other: The mastoid air cells are well aerated. IMPRESSION: 1. Evaluation is somewhat limited by motion artifact. 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 2. Additional focus of  diffusion restriction in the medial left frontal cortex which correlates with possible contrast enhancement, although this is not well defined, in part due to motion. This is concerning for a focus of metastatic disease but could also represent an acute infarct. Attention on follow-up. 3. Additional likely subacute infarcts in the right posterior frontal lobe, left occipital lobe, and right caudate tail. 4. Air-fluid level in the right maxillary sinus, which is nonspecific but can be seen in the setting of acute sinusitis. Electronically Signed   By: Wiliam Ke M.D.   On: 12/16/2022 14:40   NM PET Image Initial (PI) Skull Base To Thigh  Result Date: 12/16/2022 CLINICAL DATA:  Initial treatment strategy for non-small cell lung cancer. EXAM: NUCLEAR MEDICINE PET SKULL BASE TO THIGH TECHNIQUE: 6.8 mCi F-18 FDG was injected intravenously. Full-ring PET imaging was performed from the skull base to thigh after the radiotracer. CT data was obtained and used for attenuation correction and anatomic localization. Fasting blood glucose: 111 mg/dl COMPARISON:  CT chest 42/59/5638. FINDINGS: Mediastinal blood  pool activity: SUV max 1.7 Liver activity: SUV max NA NECK: Hypermetabolic posterior left level 2 lymph node measures 8 mm (3/33), SUV max 2.1. No additional abnormal hypermetabolism. Incidental CT findings: None. CHEST: Extensive hypermetabolic adenopathy in the low internal jugular, supraclavicular, mediastinal and right hilar stations. Index low right paratracheal nodal mass measures approximately 3.7 cm (3/93), SUV max 6.5. Masslike consolidation in the right lower lobe is somewhat difficult to measure but collectively, measures approximately 8.9 x 9.1 cm (7/74), with SUV max measured superiorly, 8.2. Incidental CT findings: Left IJ Port-A-Cath terminates in the right atrium. Atherosclerotic calcification of the aorta and coronary arteries. Heart is at the upper limits of normal in size. Trace pericardial  effusion. Small loculated right pleural effusion. Paraseptal emphysema. Suspect basilar honeycombing. ABDOMEN/PELVIS: Vague activity in the periportal region without a definite CT correlate. High left periaortic lymph nodes measure up to approximately 7 mm (3/155), SUV max 2.7. Small nodules posterior to the right kidney measure up to 10 mm (3/186) do not show abnormal hypermetabolism. No additional abnormal hypermetabolism. Incidental CT findings: Liver, adrenal glands, kidneys, spleen, pancreas, stomach and bowel are grossly unremarkable. Cholecystectomy. Prostate is slightly enlarged. SKELETON: No abnormal hypermetabolism. Incidental CT findings: Degenerative changes in the spine. Postoperative changes in the lumbar spine. IMPRESSION: 1. 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 2. Small nodules posterior to the right kidney do not show abnormal hypermetabolism. Recommend attention on follow-up. 3. Trace pericardial effusion. 4. Small loculated right pleural effusion. 5. 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 6. Mildly enlarged prostate. 7. Aortic atherosclerosis (ICD10-I70.0). Coronary artery calcification. Electronically Signed   By: Leanna Battles M.D.   On: 12/16/2022 14:39   IR IMAGING GUIDED PORT INSERTION  Result Date: 12/14/2022 INDICATION: SCLC EXAM: IMPLANTED PORT A CATH PLACEMENT WITH ULTRASOUND AND FLUOROSCOPIC GUIDANCE MEDICATIONS: None ANESTHESIA/SEDATION: Moderate (conscious) sedation was employed during this procedure. A total of Versed 2 mg and Fentanyl 100 mcg was administered intravenously. Moderate Sedation Time: 33 minutes. The patient's level of consciousness and vital signs were monitored continuously by radiology nursing throughout the procedure under my direct supervision. FLUOROSCOPY TIME:  Fluoroscopic dose; 1 mGy  COMPLICATIONS: None immediate. PROCEDURE: The procedure, risks, benefits, and alternatives were explained to the patient. Questions regarding the procedure were encouraged and answered. The patient understands and consents to the procedure. The LEFT neck and chest were prepped with chlorhexidine in a sterile fashion, and a sterile drape was applied covering the operative field. Maximum barrier sterile technique with sterile gowns and gloves were used for the procedure. A timeout was performed prior to the initiation of the procedure. Local anesthesia was provided with 1% lidocaine with epinephrine. After creating a small venotomy incision, a micropuncture kit was utilized to access the internal jugular vein under direct, real-time ultrasound guidance. Ultrasound image documentation was performed. The microwire was kinked to measure appropriate catheter length. A subcutaneous port pocket was then created along the upper chest wall utilizing a combination of sharp and blunt dissection. The pocket was irrigated with sterile saline. A single lumen ISP power injectable port was chosen for placement. The 8 Fr catheter was tunneled from the port pocket site to the venotomy incision. The port was placed in the pocket. The external catheter was trimmed to appropriate length. At the venotomy, an 8 Fr peel-away sheath was placed over a guidewire under fluoroscopic guidance. The catheter was  then placed through the sheath and the sheath was removed. Final catheter positioning was confirmed and documented with a fluoroscopic spot radiograph. The port was accessed with a Huber needle, aspirated and flushed with heparinized saline. The port pocket incision was closed with interrupted 3-0 Vicryl suture then Dermabond was applied, including at the venotomy incision. Dressings were placed. The patient tolerated the procedure well without immediate post procedural complication. IMPRESSION: Successful placement of a LEFT internal  jugular approach power injectable Port-A-Cath. The tip of the catheter is positioned within the proximal RIGHT atrium. The catheter is ready for immediate use. Roanna Banning, MD [facility] Radiology Electronically Signed   By: Roanna Banning M.D.   On: 12/14/2022 11:06   [place] CORE BIOPSY (LYMPH NODES)  Result Date: 12/12/2022 INDICATION: R Lung Mass with R Supraclavicular Lymph Node EXAM: ULTRASOUND-GUIDED RIGHT SUPRACLAVICULAR LYMPH NODE BIOPSY COMPARISON:  CT chest, 11/24/2022. MEDICATIONS: 5 mL lidocaine 1% ANESTHESIA/SEDATION: Local anesthetic was administered. COMPLICATIONS: None immediate. TECHNIQUE: Informed written consent was obtained from the patient and/or patient's representative after a discussion of the risks, benefits and alternatives to treatment. Questions regarding the procedure were encouraged and answered. Initial ultrasound scanning demonstrated enlarged RIGHT supraclavicular lymph mass. An ultrasound image was saved for documentation purposes. The procedure was planned. A timeout was performed prior to the initiation of the procedure. The operative was prepped and draped in the usual sterile fashion, and a sterile drape was applied covering the operative field. A timeout was performed prior to the initiation of the procedure. Local anesthesia was provided with 1% lidocaine. Under direct ultrasound guidance, an 18 gauge core needle device was utilized to obtain to obtain 4 core needle biopsies of the RIGHT supraclavicular lymph node. The samples were placed in saline and submitted to pathology. The needle was removed and superficial hemostasis was achieved with manual compression. Post procedure scan was negative for significant hematoma. A dressing was applied. The patient tolerated the procedure well without immediate postprocedural complication. IMPRESSION: Successful ultrasound guided biopsy of the enlarged RIGHT supraclavicular lymph node. Roanna Banning, MD [facility] Radiology Electronically Signed   By: Roanna Banning M.D.   On: 12/12/2022 14:46      WRAP UP:  All questions were answered. The patient knows to call the clinic with any problems, questions or concerns.  Medical decision making: High  Time spent on visit: I spent 30 minutes counseling the patient face to face. The total time spent in the appointment was 55 minutes and more than 50% was on counseling.  Carnella Guadalajara, PA-C  12/29/22 12:33 PM

## 2022-12-29 NOTE — ED Triage Notes (Signed)
Pt brought down from cancer center for his chemo tx. Pt previously admitted on Friday for left side collapsed lung and discharged on 12/27/22 and was informed that the lung could possibly collapse again. Per cancer center pt arrived today with same problems they did a chest xray and it shows left side collapsed lung again. Per [facility] d/c notes pt is to be seen by cardiac thoracic for reoccurring problem.

## 2022-12-29 NOTE — Progress Notes (Signed)
No IVF today per Rojelio Brenner, PA-C.

## 2022-12-29 NOTE — Progress Notes (Signed)
Notification received from PA, Rojelio Brenner that patient needs to go to ED due to abnormal CXR. Report called to [facility], Charity fundraiser. Patient transported to ED room 4 where he was handed off to [place], ED RN.

## 2022-12-30 ENCOUNTER — Inpatient Hospital Stay (HOSPITAL_COMMUNITY): Payer: 59

## 2022-12-30 DIAGNOSIS — J95811 Postprocedural pneumothorax: Secondary | ICD-10-CM | POA: Diagnosis not present

## 2022-12-30 LAB — BASIC METABOLIC PANEL
Anion gap: 7 (ref 5–15)
BUN: 13 mg/dL (ref 6–20)
CO2: 30 mmol/L (ref 22–32)
Calcium: 9.1 mg/dL (ref 8.9–10.3)
Chloride: 96 mmol/L — ABNORMAL LOW (ref 98–111)
Creatinine, Ser: 0.64 mg/dL (ref 0.61–1.24)
GFR, Estimated: >60 mL/min (ref >60)
Glucose, Bld: 120 mg/dL — ABNORMAL HIGH (ref 70–99)
Potassium: 4 mmol/L (ref 3.5–5.1)
Sodium: 133 mmol/L — ABNORMAL LOW (ref 135–145)

## 2022-12-30 LAB — CBC
HCT: 36.4 % — ABNORMAL LOW (ref 39.0–52.0)
Hemoglobin: 11.7 g/dL — ABNORMAL LOW (ref 13.0–17.0)
MCH: 28.1 pg (ref 26.0–34.0)
MCHC: 32.1 g/dL (ref 30.0–36.0)
MCV: 87.3 fL (ref 80.0–100.0)
Platelets: 160 10*3/uL (ref 150–400)
RBC: 4.17 MIL/uL — ABNORMAL LOW (ref 4.22–5.81)
RDW: 13.6 % (ref 11.5–15.5)
WBC: 4.8 10*3/uL (ref 4.0–10.5)
nRBC: 0 % (ref 0.0–0.2)

## 2022-12-30 LAB — PHOSPHORUS: Phosphorus: 3.6 mg/dL (ref 2.5–4.6)

## 2022-12-30 LAB — MAGNESIUM: Magnesium: 2 mg/dL (ref 1.7–2.4)

## 2022-12-30 MED ORDER — CHLORHEXIDINE GLUCONATE CLOTH 2 % EX PADS
6.0000 | MEDICATED_PAD | Freq: Every day | CUTANEOUS | Status: DC
  Administered 2022-12-31 – 2023-01-02 (×3): 6 via TOPICAL

## 2022-12-30 MED ORDER — MORPHINE SULFATE (PF) 2 MG/ML IV SOLN
2.0000 mg | INTRAVENOUS | Status: DC | PRN
  Administered 2022-12-30 – 2022-12-31 (×8): 2 mg via INTRAVENOUS
  Filled 2022-12-30 (×8): qty 1

## 2022-12-30 NOTE — Plan of Care (Signed)

## 2022-12-30 NOTE — Progress Notes (Signed)
NAME:  Bradley Rice, MRN:  [id number], DOB:  [birth year], LOS: 0 ADMISSION DATE:  12/29/2022, CONSULTATION DATE:  12/29/22 REFERRING MD:  Estell Harpin [facility] EM, CHIEF COMPLAINT:  recurrent L ptx    History of Present Illness:  62 yo M PMH extensive stage small cell lung cancer, recent ptx requiring chest tube 5/24-28, ILD, chronic hypoxic resp failure on 6L who presented to [facility] ED with SOB. Pt first was seen at cancer center, where he reported SOB x24 hrs, but had improved. L lung sounds were absent on oncs exam and cxr w recurrence of L ptx. Referred to ED emergently in this setting, and chest tube placed.   PCCM is consulted in this setting.   In chart review of recent hospitalization, looks like slightly complex situation- intermittent air leak w forced exhalation and at times failing clamping trials with incr in ptx size. Ultimately conservative mgmnt was pursued and pt was able to have chest tube removed, with close follow up and plan for admission to [facility] in event of recurrence for EBV evaluation / CVTS consult.   Pertinent  Medical History  Extensive stage small cell lung cancer COPD ILD Chronic hypoxia Anorexia   Significant Hospital Events: Including procedures, antibiotic start and stop dates in addition to other pertinent events   5/24-28 chest tube during admission at [facility] for ptx, dc home 5/28 5/30 recurrence of L ptx found at onc appointment. To [facility] ED, chest tube placed. Transfer to [facility]. PCCM consult   CT chest: 1. Interval placement of a left anterolateral pigtail chest tube.Tiny residual left anteroapical pneumothorax.2. Stable right lower lobe mass in keeping with a primary bronchogenic neoplasm in a background of underlying interstitial lung disease. Stable extensive mediastinal and right hilar metastatic nodal disease. 3. UIP pattern fibrotic interstitial lung disease. 4. Moderate multi-vessel coronary artery calcification.5. Morphologic changes in keeping with pulmonary arterial  hypertension.  Interim History / Subjective:  C/o left sided chest tube site pain Objective   Blood pressure 102/70, pulse (!) 102, temperature (!) 97.4 F (36.3 C), temperature source Oral, resp. rate (!) 21, height 6' (1.829 m), weight 59.5 kg, SpO2 100 %.       No intake or output data in the 24 hours ending 12/29/22 1411 Filed Weights   12/29/22 1210  Weight: 59.5 kg    Examination: General frail 62 year old male sitting up at side of bed no distress HENT temporal wasting MMM Pulm dec bilaterally fine crackles bases. Left chest tube dressing CD&I no airleak PCXR severe fibrosis. Residual small apical ptx Card rrr Abd soft  Ext warm and dry  Neuro intact Resolved Hospital Problem list     Assessment & Plan:   Recurrent L ptx, secondary Extensive stage small cell lung cancer -Currently undergoing chemotherapy Chronic hypoxic resp failure  History of COPD, pulmonary fibrosis ILD Essential hypertension Chronic pain Depression/anxiety    Recurrent L ptx, secondary Chronic hypoxic resp failure  History of COPD, pulmonary fibrosis -On 6L [place] at baseline  Plan Place CT to water seal (keep in over weekend) Routine chest tube care (flush q shift)  Continue supplemental oxygen for sat goal greater than 90% Mobilize as able PRN analgesia  Pulmonary hygiene Repeat chest x-ray in a.m. N.p.o.  midnight Sunday pending possible bronchoscopy for endobronchial valve placement   Essential hypertension Plan: Continue home beta-blocker Continuous telemetry  Chronic pain -Utilizes both OxyContin and breakthrough oxycodone at baseline Plan Continue home medications  Depression/anxiety Plan Continue home BuSpar  and duloxetine   Best Practice (right click and "Reselect all SmartList Selections" daily)   Diet/type: Regular consistency (see orders) DVT prophylaxis: SCD GI prophylaxis: PPI Lines: N/A Foley:  N/A Code Status:  full code Last date of multidisciplinary  goals of care discussion continue to update patient daily   Critical care time:  NA

## 2022-12-30 NOTE — TOC Initial Note (Signed)
Transition of Care [facility]) - Initial/Assessment Note    Patient Details  Name: Bradley Rice MRN: [id number] Date of Birth: 12/17/1960  Transition of Care [facility]) CM/SW Contact:    Kermit Balo, RN Phone Number: 12/30/2022, 3:21 PM  Clinical Narrative:                 Pt lives at home with spouse. He is active with [facility] for home oxygen at 6L/ [place].  Pt manages his own medications and denies any issues.  Pt has needed transportation at home.  TOC following for d/c needs.   Expected Discharge Plan: Home/Self Care Barriers to Discharge: Continued Medical Work up   Patient Goals and CMS Choice            Expected Discharge Plan and Services   Discharge Planning Services: CM Consult   Living arrangements for the past 2 months: Single Family Home                                      Prior Living Arrangements/Services Living arrangements for the past 2 months: Single Family Home Lives with:: Spouse Patient language and need for interpreter reviewed:: Yes Do you feel safe going back to the place where you live?: Yes      Need for Family Participation in Patient Care: Yes (Comment) Care giver support system in place?: Yes (comment) Current home services: DME (walker/ oxygen) Criminal Activity/Legal Involvement Pertinent to Current Situation/Hospitalization: No - Comment as needed  Activities of Daily Living      Permission Sought/Granted                  Emotional Assessment Appearance:: Appears stated age Attitude/Demeanor/Rapport: Engaged Affect (typically observed): Accepting Orientation: : Oriented to Self, Oriented to Place, Oriented to  Time, Oriented to Situation   Psych Involvement: No (comment)  Admission diagnosis:  Pneumothorax [J93.9] Patient Active Problem List   Diagnosis Date Noted   Pneumothorax 12/29/2022   Recurrent pneumothorax after chest tube removed 12/23/2022   Port-A-Cath in place 12/15/2022   Small cell lung  cancer, right (HCC) 12/07/2022   Acute on chronic respiratory failure with hypoxia (HCC) 11/09/2022   Hypoalbuminemia due to protein-calorie malnutrition (HCC) 11/09/2022   ALS (amyotrophic lateral sclerosis) (HCC) 11/09/2022   Anxiety 11/09/2022   Pleural effusion on right 11/09/2022   Primary insomnia 09/01/2022   Fusion of spine, cervical region 03/25/2021   Emphysema lung (HCC) 12/10/2019   IPF (idiopathic pulmonary fibrosis) (HCC) 07/15/2018   Chronic back pain 04/30/2018   Chronic pain 03/15/2018   Hypertension 09/30/2016   GAD (generalized anxiety disorder) 02/17/2015   Depression 02/17/2015   Hx of smoking 02/17/2015   Postlaminectomy syndrome, cervical region 08/15/2014   Cervical dystonia 06/13/2014   Cervicogenic headache 05/09/2014   TIA (transient ischemic attack) 12/28/2013   Cervical spondylosis with radiculopathy 12/17/2013   ED (erectile dysfunction) 11/21/2013   Multilevel spondylosis 06/25/2012   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   Carpal tunnel syndrome of left wrist 06/25/2012    Class: Chronic   PCP:  [name], FNP Pharmacy:   [facility] [place], [place] - [address] [place] [address] Phone: [phone] Fax: [phone]  [facility] [place], [place] - [address] [place] [address] Phone: [phone] Fax: [phone]     Social Determinants of Health (  SDOH) Social History: SDOH Screenings   Food Insecurity: No Food Insecurity (12/23/2022)  Housing: Low Risk  (12/23/2022)  Transportation Needs: No Transportation Needs (12/23/2022)  Utilities: Not At Risk (12/23/2022)  Depression (PHQ2-9): Low Risk  (11/18/2022)  Recent Concern: Depression (PHQ2-9) - Medium Risk (09/01/2022)  Financial Resource Strain: Low Risk  (11/18/2022)  Physical Activity: Unknown (11/18/2022)  Social Connections: Unknown (11/18/2022)  Stress: Stress Concern Present (11/18/2022)  Tobacco Use: Medium Risk (12/29/2022)    SDOH Interventions:     Readmission Risk Interventions     No data to display

## 2022-12-31 ENCOUNTER — Inpatient Hospital Stay (HOSPITAL_COMMUNITY): Payer: 59

## 2022-12-31 DIAGNOSIS — C3492 Malignant neoplasm of unspecified part of left bronchus or lung: Secondary | ICD-10-CM

## 2022-12-31 DIAGNOSIS — J9312 Secondary spontaneous pneumothorax: Secondary | ICD-10-CM

## 2022-12-31 MED ORDER — MORPHINE SULFATE (PF) 2 MG/ML IV SOLN
3.0000 mg | INTRAVENOUS | Status: DC | PRN
  Administered 2022-12-31: 3 mg via INTRAVENOUS
  Filled 2022-12-31: qty 2

## 2022-12-31 MED ORDER — MORPHINE SULFATE (PF) 2 MG/ML IV SOLN
3.0000 mg | INTRAVENOUS | Status: DC | PRN
  Administered 2022-12-31 – 2023-01-03 (×30): 3 mg via INTRAVENOUS
  Filled 2022-12-31 (×30): qty 2

## 2022-12-31 MED ORDER — METOPROLOL TARTRATE 12.5 MG HALF TABLET
12.5000 mg | ORAL_TABLET | Freq: Two times a day (BID) | ORAL | Status: DC
  Administered 2023-01-02 (×2): 12.5 mg via ORAL
  Filled 2022-12-31 (×6): qty 1

## 2022-12-31 NOTE — Progress Notes (Signed)
PROGRESS NOTE  KACHE SEBESTYEN  GNF:[id number] DOB: 1960/10/25 DOA: 12/29/2022 PCP: [name], FNP   Brief Narrative: Patient is a 62 year old male with history of advanced small cell lung cancer, recent pneumothorax requiring chest tube from 5/24 to 5/28, ILD, chronic hypoxic respite failure on 6 days of oxygen who presented to [facility] emergency department with complaint of shortness of breath.  On condition he was found to have absent lung sounds on the left, imaging consistent with recurrence of left pneumothorax.  Status post chest tube placement.  Transferred to [facility].  PCCM consulted and managing the chest tube.  Assessment & Plan:  Principal Problem:   Pneumothorax Active Problems:   Recurrent pneumothorax after chest tube removed  Recurrent left-sided pneumothorax:   He was recently admitted at [facility] for pneumothorax, was on chest tube from 5/24 to 5/28.  Status post chest tube placement.  PCCM following.  PCCM planning for possible bronchoscopy for endobronchial valve placement on Monday. Last cxr shows  small residual left apical pneumothorax.  Chronic hypoxic respiratory failure: On 6 L of oxygen at baseline.  Has history of COPD, ILD.  Hypertension: Continue beta-blocker.  Monitor blood pressure  Right lung small cell lung cancer: Imagings  show stable right lower lobe lung mass, extensive mediastinal/hilar metastatic nodal disease.  Currently on chemotherapy.  Follows with Dr. Ellin Saba  Interstitial lung disease: PCCM following.  Chronic pain syndrome: On OxyContin, breakthrough oxycodone  Anxiety: Continue Home BuSpar and duloxetine      Pressure Injury 12/29/22 Coccyx Upper Stage 2 -  Partial thickness loss of dermis presenting as a shallow open injury with a red, pink wound bed without slough. pin point size (Active)  12/29/22 1609  Location: Coccyx  Location Orientation: Upper  Staging: Stage 2 -  Partial thickness loss of dermis presenting as a  shallow open injury with a red, pink wound bed without slough.  Wound Description (Comments): pin point size  Present on Admission: Yes  Dressing Type Foam - Lift dressing to assess site every shift 12/30/22 2015    DVT prophylaxis:enoxaparin (LOVENOX) injection 40 mg Start: 12/29/22 2200 SCDs Start: 12/29/22 1831     Code Status: Full Code  Family Communication: Wife at bedside  Patient status:Inpatient  Patient is from :Home  Anticipated discharge QI:ONGE  Estimated DC date:not sure, after chest tube removal   Consultants: PCCM  Procedures:chest tube placement  Antimicrobials:  Anti-infectives (From admission, onward)    None       Subjective: Patient seen and examined at bedside today.  Hemodynamically stable ,comfortable.  Lying in bed.  Denies any worsening shortness of breath or cough but complains of pain on the left chest.  On 6 of oxygen per minute.  Not in any acute respiratory distress.  Objective: Vitals:   12/31/22 0404 12/31/22 0632 12/31/22 0718 12/31/22 0725  BP: 95/61  (!) 76/49 95/74  Pulse: 100  (!) 106   Resp: 16  19 18   Temp: 97.9 F (36.6 C)  98.4 F (36.9 C)   TempSrc: Oral  Oral   SpO2: 95%  96%   Weight:  56.7 kg    Height:        Intake/Output Summary (Last 24 hours) at 12/31/2022 0802 Last data filed at 12/31/2022 0400 Gross per 24 hour  Intake 140 ml  Output 20 ml  Net 120 ml   Filed Weights   12/29/22 1210 12/30/22 0500 12/31/22 0632  Weight: 59.5 kg 59.4 kg 56.7 kg  Examination:  General exam: Overall comfortable, not in distress,thin built HEENT: PERRL Respiratory system: Diminished sounds on the left side, left-sided chest tube Cardiovascular system: S1 & S2 heard, RRR.  Gastrointestinal system: Abdomen is nondistended, soft and nontender. Central nervous system: Alert and oriented Extremities: No edema, no clubbing ,no cyanosis Skin: No rashes, no ulcers,no icterus     Data Reviewed: I have personally reviewed  following labs and imaging studies  CBC: Recent Labs  Lab 12/26/22 0407 12/27/22 0420 12/29/22 0931 12/29/22 1845 12/30/22 1122  WBC 11.6* 10.9* 7.1 5.4 4.8  NEUTROABS  --   --  5.7  --   --   HGB 12.0* 11.8* 12.5* 11.5* 11.7*  HCT 38.7* 38.1* 38.2* 35.7* 36.4*  MCV 94.2 93.6 89.9 89.3 87.3  PLT 277 220 184 164 160   Basic Metabolic Panel: Recent Labs  Lab 12/25/22 0350 12/26/22 0407 12/27/22 0420 12/29/22 0931 12/29/22 1845 12/30/22 1122  NA 136 139 134* 134*  --  133*  K 4.6 4.5 4.4 4.0  --  4.0  CL 98 99 96* 97*  --  96*  CO2 32 34* 32 27  --  30  GLUCOSE 125* 90 102* 122*  --  120*  BUN 20 27* 26* 12  --  13  CREATININE 0.77 0.73 0.71 0.57* 0.56* 0.64  CALCIUM 9.2 9.3 9.1 9.4  --  9.1  MG  --   --   --  1.9  --  2.0  PHOS  --   --   --   --   --  3.6     Recent Results (from the past 240 hour(s))  Culture, blood (routine x 2)     Status: None   Collection Time: 12/23/22 10:20 AM   Specimen: Right Antecubital; Blood  Result Value Ref Range Status   Specimen Description   Final    RIGHT ANTECUBITAL BOTTLES DRAWN AEROBIC AND ANAEROBIC   Special Requests   Final    Blood Culture results may not be optimal due to an excessive volume of blood received in culture bottles   Culture   Final    NO GROWTH 5 DAYS Performed at [facility], [address], [place], [place] [address]    Report Status 12/28/2022 FINAL  Final  Culture, blood (routine x 2)     Status: None   Collection Time: 12/23/22 10:20 AM   Specimen: Left Antecubital; Blood  Result Value Ref Range Status   Specimen Description   Final    LEFT ANTECUBITAL BOTTLES DRAWN AEROBIC AND ANAEROBIC   Special Requests Blood Culture adequate volume  Final   Culture   Final    NO GROWTH 5 DAYS Performed at [facility], [address] [place], [place] [address]    Report Status 12/28/2022 FINAL  Final  SARS Coronavirus 2 by RT PCR (hospital order, performed in [facility] hospital lab) *cepheid single  result test* Anterior Nasal Swab     Status: None   Collection Time: 12/23/22 10:32 AM   Specimen: Anterior Nasal Swab  Result Value Ref Range Status   SARS Coronavirus 2 by RT PCR NEGATIVE NEGATIVE Final    Comment: (NOTE) SARS-CoV-2 target nucleic acids are NOT DETECTED.  The SARS-CoV-2 RNA is generally detectable in upper and lower respiratory specimens during the acute phase of infection. The lowest concentration of SARS-CoV-2 viral copies this assay can detect is 250 copies / mL. A negative result does not preclude SARS-CoV-2 infection and should  not be used as the sole basis for treatment or other patient management decisions.  A negative result may occur with improper specimen collection / handling, submission of specimen other than nasopharyngeal swab, presence of viral mutation(s) within the areas targeted by this assay, and inadequate number of viral copies (<250 copies / mL). A negative result must be combined with clinical observations, patient history, and epidemiological information.  Fact Sheet for Patients:   RoadLapTop.co.za  Fact Sheet for Healthcare Providers: http://kim-miller.com/  This test is not yet approved or  cleared by the [place] FDA and has been authorized for detection and/or diagnosis of SARS-CoV-2 by FDA under an Emergency Use Authorization (EUA).  This EUA will remain in effect (meaning this test can be used) for the duration of the COVID-19 declaration under Section 564(b)(1) of the Act, 21 U.S.C. section 360bbb-3(b)(1), unless the authorization is terminated or revoked sooner.  Performed at [facility], [address] [place], [place] [address]      Radiology Studies: [facility] Chest [facility] 1 View  Result Date: 12/30/2022 CLINICAL DATA:  Chest tube. EXAM: PORTABLE CHEST 1 VIEW COMPARISON:  12/29/2022. FINDINGS: 0601 hours. Unchanged left chest Port-A-Cath with tip projecting over the superior  cavoatrial junction. Unchanged left-sided pleural drainage catheter. Small residual left apical pneumothorax. Unchanged right lower lobe mass and background of interstitial lung disease. Stable cardiac and mediastinal contours. No pleural effusion. IMPRESSION: 1. Unchanged left-sided pleural drainage catheter with small residual left apical pneumothorax. 2. Unchanged right lower lobe mass and background of interstitial lung disease. Electronically Signed   By: Orvan Falconer M.D.   On: 12/30/2022 08:55   CT CHEST WO CONTRAST  Result Date: 12/29/2022 CLINICAL DATA:  Pneumothorax EXAM: CT CHEST WITHOUT CONTRAST TECHNIQUE: Multidetector CT imaging of the chest was performed following the standard protocol without IV contrast. RADIATION DOSE REDUCTION: This exam was performed according to the departmental dose-optimization program which includes automated exposure control, adjustment of the mA and/or kV according to patient size and/or use of iterative reconstruction technique. COMPARISON:  11/24/2022 FINDINGS: Cardiovascular: Moderate multi-vessel coronary artery calcification. Global cardiac size within normal limits. No pericardial effusion. Central pulmonary arteries are enlarged in keeping with pulmonary arterial hypertension. Mild atherosclerotic calcification within the thoracic aorta. No aortic aneurysm. Left internal jugular chest port tip seen within the a right atrium. Mediastinum/Nodes: Visualized thyroid is unremarkable. Confluent, pathologic right paratracheal, right pre-vascular, right hilar, and subcarinal adenopathy is again identified, grossly unchanged from prior examination in keeping with metastatic nodal disease. Lungs/Pleura: Anterolateral left pigtail chest tube has been placed. Tiny left anteroapical pneumothorax is present. 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 Multilobulated mass within the right lower lobe in keeping with a primary bronchogenic  neoplasm in a background of underlying interstitial lung disease again noted, grossly unchanged from prior examination. No pleural effusion. No central obstructing lesion. Upper Abdomen: No acute abnormality. Musculoskeletal: No acute bone abnormality. No lytic or blastic bone lesion. IMPRESSION: 1. Interval placement of a left anterolateral pigtail chest tube. Tiny residual left anteroapical pneumothorax. 2. Stable right lower lobe mass in keeping with a primary bronchogenic neoplasm in a background of underlying interstitial lung disease. Stable extensive mediastinal and right hilar metastatic nodal disease. 3. UIP pattern fibrotic interstitial lung disease. 4. Moderate multi-vessel coronary artery calcification. 5. Morphologic changes in keeping with pulmonary arterial hypertension. Aortic Atherosclerosis (ICD10-I70.0). Electronically Signed   By: Helyn Numbers M.D.   On: 12/29/2022 23:15   DG Chest Portable  1 View  Result Date: 12/29/2022 CLINICAL DATA:  chest tube EXAM: PORTABLE CHEST 1 VIEW COMPARISON:  CXR 12/27/22 FINDINGS: Left-sided thoracostomy tube in place without a radiographically discernible pneumothorax. No pleural effusion. Unchanged cardiac and mediastinal contours. Left-sided needle accessed port in place with the tip near the cavoatrial junction. Unchanged nodular opacity at the right lung base. Unchanged extensive bilateral interstitial opacities, favored to represent sequela fibrotic lung disease. No radiographically apparent displaced rib fractures. IMPRESSION: Left-sided thoracostomy tube in place without a radiographically discernible pneumothorax. Electronically Signed   By: Lorenza Cambridge M.D.   On: 12/29/2022 13:19   DG Chest 2 View  Result Date: 12/29/2022 CLINICAL DATA:  Worsening shortness of breath, concern for recurrent pneumothorax. EXAM: CHEST - 2 VIEW COMPARISON:  Two days ago FINDINGS: Large left pneumothorax, over 50% with left diaphragm depression. A left chest tube has  been removed. Known right lower lobe mass. Extensive interstitial opacity from pulmonary fibrosis and emphysema. Left-sided porta catheter with tip at the upper right atrium. Normal heart size. There is mediastinal adenopathy less well appreciated when compared to recent CT. Critical Value/emergent results were called by telephone at the time of interpretation on 12/29/2022 at 12:31 pm to provider Dr. Estell Harpin, who verbally acknowledged these results. IMPRESSION: Large left pneumothorax with diaphragm depression. Electronically Signed   By: Tiburcio Pea M.D.   On: 12/29/2022 12:31    Scheduled Meds:  busPIRone  5 mg Oral TID   Chlorhexidine Gluconate Cloth  6 each Topical Daily   enoxaparin (LOVENOX) injection  40 mg Subcutaneous Q24H   lidocaine  1 patch Transdermal Q24H   metoprolol tartrate  50 mg Oral BID   oxyCODONE  20 mg Oral Q12H   sodium chloride flush  10 mL Intrapleural Q8H   Continuous Infusions:   LOS: 2 days   Burnadette Pop, MD [facility] P6/08/2022, 8:02 AM

## 2022-12-31 NOTE — Progress Notes (Addendum)
Wrong charting

## 2022-12-31 NOTE — Progress Notes (Signed)
 NAME:  Bradley Rice, MRN:  [id number], DOB:  05/25/1961, LOS: 0 ADMISSION DATE:  12/29/2022, CONSULTATION DATE:  12/29/22 REFERRING MD:  [facility] EM, CHIEF COMPLAINT:  recurrent L ptx    History of Present Illness:  62 yo M PMH extensive stage small cell lung cancer, recent ptx requiring chest tube 5/24-28, ILD, chronic hypoxic resp failure on 6L who presented to [facility] ED with SOB. Pt first was seen at cancer center, where he reported SOB x24 hrs, but had improved. L lung sounds were absent on oncs exam and cxr w recurrence of L ptx. Referred to ED emergently in this setting, and chest tube placed.   PCCM is consulted in this setting.   In chart review of recent hospitalization, looks like slightly complex situation- intermittent air leak w forced exhalation and at times failing clamping trials with incr in ptx size. Ultimately conservative mgmnt was pursued and pt was able to have chest tube removed, with close follow up and plan for admission to [place] in event of recurrence for EBV evaluation / CVTS consult.   Pertinent  Medical History  Extensive stage small cell lung cancer COPD ILD Chronic hypoxia Anorexia   Significant Hospital Events: Including procedures, antibiotic start and stop dates in addition to other pertinent events   5/24-28 chest tube during admission at [facility] for ptx, dc home 5/28 5/30 recurrence of L ptx found at onc appointment. To [facility] ED, chest tube placed. Transfer to [facility]. PCCM consult   CT chest: 1. Interval placement of a left anterolateral pigtail chest tube.Tiny residual left anteroapical pneumothorax.2. Stable right lower lobe mass in keeping with a primary bronchogenic neoplasm in a background of underlying interstitial lung disease. Stable extensive mediastinal and right hilar metastatic nodal disease. 3. UIP pattern fibrotic interstitial lung disease. 4. Moderate multi-vessel coronary artery calcification.5. Morphologic changes in keeping with pulmonary arterial  hypertension.  Interim History / Subjective:   Didn't sleep good. Increased chest pains. Repeat cxr with increased ptx   Objective   Blood pressure 102/70, pulse (!) 102, temperature (!) 97.4 F (36.3 C), temperature source Oral, resp. rate (!) 21, height 6' (1.829 m), weight 59.5 kg, SpO2 100 %.       No intake or output data in the 24 hours ending 12/29/22 1411 Filed Weights   12/29/22 1210  Weight: 59.5 kg    Examination: General: thin frail male HENT temporal muscle wasting  Pulm: diminshed breath sounds bilaterally  Card: RRR, s1 s2  Abd: soft nt nd  Ext: thin, muscle wasting   Neuro: alert oriented following commands   Resolved Hospital Problem list     Assessment & Plan:   Recurrent L ptx, secondary Extensive stage small cell lung cancer -Currently undergoing chemotherapy Chronic hypoxic resp failure  History of COPD, pulmonary fibrosis ILD Essential hypertension Chronic pain Depression/anxiety Recurrent L ptx, secondary Chronic hypoxic resp failure  History of COPD, pulmonary fibrosis Essential hypertension Chronic pain Depression/anxiety  Recurrent left ptx.  -changed to water seal 5/31, PTX worse today Plan We will plan will be to place back to suction as he is symptomatic We will plan for EBV placement hopefully early next week.  I have to order the valves and I hope they are here by Monday.     Best Practice (right click and "Reselect all SmartList Selections" daily)   Diet/type: Regular consistency (see orders) DVT prophylaxis: SCD GI prophylaxis: PPI Lines: N/A Foley:  N/A Code Status:  full code Last   date of multidisciplinary goals of care discussion continue to update patient daily  Critical care time:  NA    Taraji Mungo L Micca Matura, DO [place] [facility] 12/31/2022 9:39 AM     

## 2022-12-31 NOTE — Plan of Care (Signed)

## 2022-12-31 NOTE — H&P (View-Only) (Signed)
NAME:  Bradley Rice, MRN:  [id number], DOB:  16-Oct-1960, LOS: 0 ADMISSION DATE:  12/29/2022, CONSULTATION DATE:  12/29/22 REFERRING MD:  Estell Harpin [facility] EM, CHIEF COMPLAINT:  recurrent L ptx    History of Present Illness:  62 yo M PMH extensive stage small cell lung cancer, recent ptx requiring chest tube 5/24-28, ILD, chronic hypoxic resp failure on 6L who presented to [facility] ED with SOB. Pt first was seen at cancer center, where he reported SOB x24 hrs, but had improved. L lung sounds were absent on oncs exam and cxr w recurrence of L ptx. Referred to ED emergently in this setting, and chest tube placed.   PCCM is consulted in this setting.   In chart review of recent hospitalization, looks like slightly complex situation- intermittent air leak w forced exhalation and at times failing clamping trials with incr in ptx size. Ultimately conservative mgmnt was pursued and pt was able to have chest tube removed, with close follow up and plan for admission to [facility] in event of recurrence for EBV evaluation / CVTS consult.   Pertinent  Medical History  Extensive stage small cell lung cancer COPD ILD Chronic hypoxia Anorexia   Significant Hospital Events: Including procedures, antibiotic start and stop dates in addition to other pertinent events   5/24-28 chest tube during admission at [facility] for ptx, dc home 5/28 5/30 recurrence of L ptx found at onc appointment. To [facility] ED, chest tube placed. Transfer to [facility] PCCM consult   CT chest: 1. Interval placement of a left anterolateral pigtail chest tube.Tiny residual left anteroapical pneumothorax.2. Stable right lower lobe mass in keeping with a primary bronchogenic neoplasm in a background of underlying interstitial lung disease. Stable extensive mediastinal and right hilar metastatic nodal disease. 3. UIP pattern fibrotic interstitial lung disease. 4. Moderate multi-vessel coronary artery calcification.5. Morphologic changes in keeping with pulmonary arterial  hypertension.  Interim History / Subjective:   Didn't sleep good. Increased chest pains. Repeat cxr with increased ptx   Objective   Blood pressure 102/70, pulse (!) 102, temperature (!) 97.4 F (36.3 C), temperature source Oral, resp. rate (!) 21, height 6' (1.829 m), weight 59.5 kg, SpO2 100 %.       No intake or output data in the 24 hours ending 12/29/22 1411 Filed Weights   12/29/22 1210  Weight: 59.5 kg    Examination: General: thin frail male HENT temporal muscle wasting  Pulm: diminshed breath sounds bilaterally  Card: RRR, s1 s2  Abd: soft nt nd  Ext: thin, muscle wasting   Neuro: alert oriented following commands   Resolved Hospital Problem list     Assessment & Plan:   Recurrent L ptx, secondary Extensive stage small cell lung cancer -Currently undergoing chemotherapy Chronic hypoxic resp failure  History of COPD, pulmonary fibrosis ILD Essential hypertension Chronic pain Depression/anxiety Recurrent L ptx, secondary Chronic hypoxic resp failure  History of COPD, pulmonary fibrosis Essential hypertension Chronic pain Depression/anxiety  Recurrent left ptx.  -changed to water seal 5/31, PTX worse today Plan We will plan will be to place back to suction as he is symptomatic We will plan for EBV placement hopefully early next week.  I have to order the valves and I hope they are here by Monday.     Best Practice (right click and "Reselect all SmartList Selections" daily)   Diet/type: Regular consistency (see orders) DVT prophylaxis: SCD GI prophylaxis: PPI Lines: N/A Foley:  N/A Code Status:  full code Last  date of multidisciplinary goals of care discussion continue to update patient daily  Critical care time:  NA    Josephine Igo, DO [place] [facility] 12/31/2022 9:39 AM

## 2023-01-01 ENCOUNTER — Inpatient Hospital Stay (HOSPITAL_COMMUNITY): Payer: 59

## 2023-01-01 DIAGNOSIS — J9312 Secondary spontaneous pneumothorax: Secondary | ICD-10-CM | POA: Diagnosis not present

## 2023-01-01 MED ORDER — MORPHINE SULFATE (PF) 2 MG/ML IV SOLN
1.0000 mg | Freq: Once | INTRAVENOUS | Status: AC
  Administered 2023-01-01: 1 mg via INTRAVENOUS
  Filled 2023-01-01: qty 1

## 2023-01-01 NOTE — ED Provider Notes (Signed)
[facility] 4E CV SURGICAL PROGRESSIVE CARE Provider Note   CSN: [id number] Arrival date & time: 12/29/22  1155     History  Chief Complaint  Patient presents with   Lung Collapsed     Bradley Rice is a 62 y.o. male.  Patient has a history of lung cancer and pneumothorax.  He was recently discharged from the hospital after having a pneumothorax.  Patient complains of shortness of breath  The history is provided by the patient and medical records. No language interpreter was used.  Shortness of Breath Severity:  Moderate Onset quality:  Sudden Timing:  Constant Progression:  Worsening Chronicity:  New Context: activity   Relieved by:  Nothing Worsened by:  Activity Ineffective treatments:  None tried Associated symptoms: no abdominal pain, no chest pain, no cough, no headaches and no rash   Risk factors: no recent alcohol use        Home Medications Prior to Admission medications   Medication Sig Start Date End Date Taking? Authorizing Provider  albuterol (PROVENTIL) (2.5 MG/3ML) 0.083% nebulizer solution Take 3 mLs (2.5 mg total) by nebulization every 6 (six) hours as needed for wheezing or shortness of breath. 11/24/22  Yes Glenford Bayley, NP  albuterol (VENTOLIN HFA) 108 (90 Base) MCG/ACT inhaler Inhale 2 puffs into the lungs every 6 (six) hours as needed for wheezing or shortness of breath. 11/24/22  Yes Glenford Bayley, NP  benzonatate (TESSALON) 200 MG capsule Take 1 capsule (200 mg total) by mouth 3 (three) times daily as needed. Patient taking differently: Take 200 mg by mouth 3 (three) times daily as needed for cough. 11/18/22  Yes Hawks, Christy A, FNP  busPIRone (BUSPAR) 5 MG tablet Take 1 tablet (5 mg total) by mouth 3 (three) times daily. 08/31/21  Yes Hawks, Christy A, FNP  CARBOPLATIN IV Inject into the vein every 21 ( twenty-one) days. 12/19/22  Yes [provider]  DURVALUMAB IV Inject into the vein every 21 ( twenty-one) days. 12/19/22  Yes [provider]  ETOPOSIDE IV Inject into the vein every 21 ( twenty-one) days. 12/19/22  Yes [provider]  famotidine (PEPCID) 20 MG tablet Take 1 tablet (20 mg total) by mouth 2 (two) times daily. 12/20/22  Yes Doreatha Massed, MD  fexofenadine (ALLEGRA) 180 MG tablet Take 1 tablet (180 mg total) by mouth daily. 02/28/22  Yes Hawks, Christy A, FNP  ibuprofen (ADVIL) 800 MG tablet Take 1 tablet (800 mg total) by mouth every 8 (eight) hours as needed. 11/22/22  Yes Hawks, Neysa Bonito A, FNP  lidocaine-prilocaine (EMLA) cream Apply a small amount to port a cath site and cover with plastic wrap 1 hour prior to infusion appointments 12/15/22  Yes Doreatha Massed, MD  magic mouthwash w/lidocaine SOLN Take 10 mLs by mouth 3 (three) times daily as needed for mouth pain. 11/18/22  Yes Hawks, Christy A, FNP  megestrol (MEGACE) 400 MG/10ML suspension Take 10 mLs (400 mg total) by mouth 2 (two) times daily. 12/19/22  Yes Doreatha Massed, MD  metoprolol tartrate (LOPRESSOR) 50 MG tablet Take 0.5 tablets (25 mg total) by mouth 2 (two) times daily. 11/11/22  Yes Johnson, Clanford L, MD  naloxone (NARCAN) nasal spray 4 mg/0.1 mL Place 1 spray into the nose as needed (opioid overdose).   Yes [provider]  Nintedanib (OFEV) 150 MG CAPS Take 1 capsule (150 mg total) by mouth 2 (two) times daily. 11/29/22  Yes Glenford Bayley, NP  nortriptyline (PAMELOR) 50  MG capsule Take 50 mg by mouth 2 (two) times daily. 11/28/16  Yes [provider]  oxyCODONE (OXYCONTIN) 20 mg 12 hr tablet Take 1 tablet (20 mg total) by mouth every 12 (twelve) hours. 12/19/22  Yes Doreatha Massed, MD  oxyCODONE (ROXICODONE) 15 MG immediate release tablet Take 15 mg by mouth 4 (four) times daily. 06/22/18  Yes [provider]  prochlorperazine (COMPAZINE) 10 MG tablet Take 1 tablet (10 mg total) by mouth every 6 (six) hours as needed for nausea or vomiting. 12/15/22  Yes Doreatha Massed, MD   traZODone (DESYREL) 50 MG tablet Take 2 tablets (100 mg total) by mouth at bedtime. Patient taking differently: Take 100 mg by mouth at bedtime as needed for sleep. 11/11/22  Yes Johnson, Clanford L, MD  blood glucose meter kit and supplies Dispense based on patient and insurance preference. Use up to four times daily as directed. (FOR ICD-10 E10.9, E11.9). 05/18/21   [name], FNP  DULoxetine (CYMBALTA) 60 MG capsule Take 1 capsule (60 mg total) by mouth daily. 09/01/22   [name], FNP  Ensure Max Protein (ENSURE MAX PROTEIN) LIQD Take 330 mLs by mouth 3 (three) times daily. 11/18/22   [name], FNP  Misc. Devices MISC Patient require 6L Nasal Cannula Oxygen 12/13/22   Doreatha Massed, MD      Allergies    Pirfenidone    Review of Systems   Review of Systems  Constitutional:  Negative for appetite change and fatigue.  HENT:  Negative for congestion, ear discharge and sinus pressure.   Eyes:  Negative for discharge.  Respiratory:  Positive for shortness of breath. Negative for cough.   Cardiovascular:  Negative for chest pain.  Gastrointestinal:  Negative for abdominal pain and diarrhea.  Genitourinary:  Negative for frequency and hematuria.  Musculoskeletal:  Negative for back pain.  Skin:  Negative for rash.  Neurological:  Negative for seizures and headaches.  Psychiatric/Behavioral:  Negative for hallucinations.     Physical Exam Updated Vital Signs BP 98/61   Pulse (!) 101   Temp 97.6 F (36.4 C)   Resp 16   Ht 6' (1.829 m)   Wt 57.3 kg   SpO2 100%   BMI 17.13 kg/m  Physical Exam Vitals and nursing note reviewed.  Constitutional:      Appearance: He is well-developed. He is ill-appearing.  HENT:     Head: Normocephalic.     Nose: Nose normal.  Eyes:     General: No scleral icterus.    Conjunctiva/sclera: Conjunctivae normal.  Neck:     Thyroid: No thyromegaly.  Cardiovascular:     Rate and Rhythm: Normal rate and regular rhythm.      Heart sounds: No murmur heard.    No friction rub. No gallop.  Pulmonary:     Breath sounds: No stridor. No wheezing or rales.     Comments: Decreased breath sounds on the left Chest:     Chest wall: No tenderness.  Abdominal:     General: There is no distension.     Tenderness: There is no abdominal tenderness. There is no rebound.  Musculoskeletal:        General: Normal range of motion.     Cervical back: Neck supple.  Lymphadenopathy:     Cervical: No cervical adenopathy.  Skin:    Findings: No erythema or rash.  Neurological:     Mental Status: He is alert and oriented to person, place, and time.  Motor: No abnormal muscle tone.     Coordination: Coordination normal.  Psychiatric:        Behavior: Behavior normal.     ED Results / Procedures / Treatments   Labs (all labs ordered are listed, but only abnormal results are displayed) Labs Reviewed  CBC - Abnormal; Notable for the following components:      Result Value   RBC 4.00 (*)    Hemoglobin 11.5 (*)    HCT 35.7 (*)    All other components within normal limits  CREATININE, SERUM - Abnormal; Notable for the following components:   Creatinine, Ser 0.56 (*)    All other components within normal limits  CBC - Abnormal; Notable for the following components:   RBC 4.17 (*)    Hemoglobin 11.7 (*)    HCT 36.4 (*)    All other components within normal limits  BASIC METABOLIC PANEL - Abnormal; Notable for the following components:   Sodium 133 (*)    Chloride 96 (*)    Glucose, Bld 120 (*)    All other components within normal limits  MAGNESIUM  PHOSPHORUS    EKG EKG Interpretation  Date/Time:  Thursday Dec 29 2022 12:05:20 EDT Ventricular Rate:  118 PR Interval:  129 QRS Duration: 101 QT Interval:  293 QTC Calculation: 411 R Axis:   90 Text Interpretation: Sinus tachycardia Right atrial enlargement Borderline right axis deviation Confirmed by Kommor, Madison ([id number]) on 12/30/2022 1:39:59  PM  Radiology DG Chest Port 1 View  Result Date: 01/01/2023 CLINICAL DATA:  62 year old male with a history of small cell lung cancer on chemotherapy. Recent pneumothorax, hospitalization. Pneumothorax recurrence, chest tube in place. EXAM: PORTABLE CHEST 1 VIEW COMPARISON:  Portable chest 12/31/2022 and earlier. FINDINGS: Portable AP upright views at 0742 hours. Stable pigtail left chest tube. Residual small left apical pneumothorax, volume decreased compared to yesterday. Pleural edge no longer visible at the costophrenic angle. Otherwise stable ventilation. Abnormal right mediastinal contours. Left chest Port-A-Cath. Diffuse coarse pulmonary interstitial opacity. Visualized tracheal air column is within normal limits. Stable cholecystectomy clips. Negative visible bowel gas. IMPRESSION: 1. Stable left chest tube with decreased pneumothorax from yesterday. Small residual in the apex. 2. Otherwise stable ventilation. Abnormal lungs and right mediastinal contours, see details on Chest CT 12/29/2022. Electronically Signed   By: Odessa Fleming M.D.   On: 01/01/2023 09:36   DG Chest Port 1 View  Result Date: 12/31/2022 CLINICAL DATA:  Chest tube surveillance EXAM: PORTABLE CHEST 1 VIEW COMPARISON:  12/30/2022 FINDINGS: Enlarging left pneumothorax (estimated at approximately 25%). Left sided pigtail pleural drainage catheter is position within the left mid lung. No abnormal shift of the heart or mediastinum. Left chest port remains in place. Known right lower lobe mass. Background of interstitial lung disease. No right-sided pneumothorax. IMPRESSION: Enlarging left pneumothorax (estimated at approximately 25%). Left sided chest tube remains in place. These results will be called to the ordering clinician or representative by the Radiologist Assistant, and communication documented in the PACS or [employer]. Electronically Signed   By: Duanne Guess D.O.   On: 12/31/2022 09:33    Procedures Procedures     Medications Ordered in ED Medications  docusate sodium (COLACE) capsule 100 mg (has no administration in time range)  polyethylene glycol (MIRALAX / GLYCOLAX) packet 17 g (has no administration in time range)  enoxaparin (LOVENOX) injection 40 mg (40 mg Subcutaneous Given 12/31/22 2152)  oxyCODONE (OXYCONTIN) 12 hr tablet 20 mg (20 mg Oral Given  01/01/23 0943)  oxyCODONE (Oxy IR/ROXICODONE) immediate release tablet 5-10 mg (10 mg Oral Given 01/01/23 1112)  lidocaine (LIDODERM) 5 % 1 patch (1 patch Transdermal Not Given 12/31/22 2013)  albuterol (PROVENTIL) (2.5 MG/3ML) 0.083% nebulizer solution 2.5 mg (has no administration in time range)  naloxone [facility]) injection 0.4 mg (has no administration in time range)  traZODone (DESYREL) tablet 100 mg (100 mg Oral Given 12/31/22 2151)  busPIRone (BUSPAR) tablet 5 mg (5 mg Oral Given by Other 01/01/23 0945)  sodium chloride flush (NS) 0.9 % injection 10 mL (10 mLs Intrapleural Given 01/01/23 0312)  morphine (PF) 2 MG/ML injection (  Not Given 12/29/22 1900)  Chlorhexidine Gluconate Cloth 2 % PADS 6 each (0 each Topical Duplicate 12/31/22 0918)  morphine (PF) 2 MG/ML injection 3 mg (3 mg Intravenous Given 01/01/23 1206)  metoprolol tartrate (LOPRESSOR) tablet 12.5 mg (12.5 mg Oral Not Given 12/31/22 2154)  morphine (PF) 2 MG/ML injection 2 mg (2 mg Intravenous Given 12/29/22 1227)  lidocaine HCl (PF) (XYLOCAINE) 2 % injection (  Given 12/29/22 1250)  morphine (PF) 2 MG/ML injection 2 mg (2 mg Intravenous Given 12/29/22 1248)  HYDROmorphone (DILAUDID) injection 0.5 mg (0.5 mg Intravenous Given 12/29/22 1353)  HYDROmorphone (DILAUDID) injection 0.5 mg (0.5 mg Intravenous Given 12/29/22 1519)  diazepam (VALIUM) injection 2.5 mg (2.5 mg Intravenous Given 12/29/22 1607)  morphine (PF) 2 MG/ML injection 2 mg (2 mg Intravenous Given 12/29/22 2140)    ED Course/ Medical Decision Making/ A&P    CRITICAL CARE Performed by: Bethann Berkshire Total critical care time: 40  minutes Critical care time was exclusive of separately billable procedures and treating other patients. Critical care was necessary to treat or prevent imminent or life-threatening deterioration. Critical care was time spent personally by me on the following activities: development of treatment plan with patient and/or surrogate as well as nursing, discussions with consultants, evaluation of patient's response to treatment, examination of patient, obtaining history from patient or surrogate, ordering and performing treatments and interventions, ordering and review of laboratory studies, ordering and review of radiographic studies, pulse oximetry and re-evaluation of patient's condition.                            Medical Decision Making Risk Prescription drug management. Decision regarding hospitalization.   This patient presents to the ED for concern of shortness of breath, this involves an extensive number of treatment options, and is a complaint that carries with it a high risk of complications and morbidity.  The differential diagnosis includes pneumothorax, pneumonia   Co morbidities that complicate the patient evaluation  Lung cancer and pneumothorax   Additional history obtained:  Additional history obtained from patient External records from outside source obtained and reviewed including hospital records   Lab Tests:  No labs  Imaging Studies ordered:  I ordered imaging studies including chest x-ray I independently visualized and interpreted imaging which showed large left-sided pneumothorax I agree with the radiologist interpretation   Cardiac Monitoring: / EKG:  The patient was maintained on a cardiac monitor.  I personally viewed and interpreted the cardiac monitored which showed an underlying rhythm of: Tachycardia   Consultations Obtained:  I requested consultation with the general surgery and and pulmonary,  and discussed lab and imaging findings as well as  pertinent plan - they recommend: General surgery placed a chest tube and pulmonary will admit to [facility]   Problem List / ED Course / Critical  interventions / Medication management  Lung cancer and pneumothorax I ordered medication including morphine for pain Reevaluation of the patient after these medicines showed that the patient improved I have reviewed the patients home medicines and have made adjustments as needed   Social Determinants of Health:  None   Test / Admission - Considered:  None     Lung cancer and pneumothorax requiring chest tube with admission        Final Clinical Impression(s) / ED Diagnoses Final diagnoses:  None    Rx / DC Orders ED Discharge Orders     None         Bethann Berkshire, MD 01/01/23 1216

## 2023-01-01 NOTE — Progress Notes (Signed)
PCCM:  NPO midnight.  Planned for EBV placement tomorrow  Josephine Igo, DO [place] [facility] 01/01/2023 6:25 PM

## 2023-01-01 NOTE — Progress Notes (Signed)
PROGRESS NOTE  RENALD CARNELL  WUJ:[id number] DOB: 27-Sep-1960 DOA: 12/29/2022 PCP: [name], FNP   Brief Narrative: Patient is a 62 year old male with history of advanced small cell lung cancer, recent pneumothorax requiring chest tube from 5/24 to 5/28, ILD, chronic hypoxic respite failure on 6 days of oxygen who presented to [facility] emergency department with complaint of shortness of breath.  On condition he was found to have absent lung sounds on the left, imaging consistent with recurrence of left pneumothorax.  Status post chest tube placement.  Transferred to [facility].  PCCM consulted and managing the chest tube.  Assessment & Plan:  Principal Problem:   Pneumothorax Active Problems:   Recurrent pneumothorax after chest tube removed  Recurrent left-sided pneumothorax:   He was recently admitted at [facility] for pneumothorax, was on chest tube from 5/24 to 5/28.  Status post chest tube placement.  PCCM following.  PCCM planning for possible bronchoscopy for endobronchial valve placement on Monday. CXR on 6/2 shows  small residual left pneumothorax on apex,better than before.  Chronic hypoxic respiratory failure: On 6 L of oxygen at baseline.  Has history of COPD, ILD.  Right lung small cell lung cancer: Imagings  show stable right lower lobe lung mass, extensive mediastinal/hilar metastatic nodal disease.  Currently on chemotherapy.  Follows with Dr. Ellin Saba  Interstitial lung disease: PCCM following.  Chronic pain syndrome: On OxyContin, breakthrough oxycodone  Anxiety: Continue Home BuSpar and duloxetine  Sinus tachycardia: Patient says it is a  chronic problem. Recent TSH is normal.  On metoprolol      Pressure Injury 12/29/22 Coccyx Upper Stage 2 -  Partial thickness loss of dermis presenting as a shallow open injury with a red, pink wound bed without slough. pin point size (Active)  12/29/22 1609  Location: Coccyx  Location Orientation: Upper  Staging: Stage 2 -   Partial thickness loss of dermis presenting as a shallow open injury with a red, pink wound bed without slough.  Wound Description (Comments): pin point size  Present on Admission: Yes  Dressing Type Foam - Lift dressing to assess site every shift 12/31/22 1945    DVT prophylaxis:enoxaparin (LOVENOX) injection 40 mg Start: 12/29/22 2200 SCDs Start: 12/29/22 1831     Code Status: Full Code  Family Communication: Wife at bedside  Patient status:Inpatient  Patient is from :Home  Anticipated discharge NF:AOZH  Estimated DC date:not sure, after chest tube removal   Consultants: PCCM  Procedures:chest tube placement  Antimicrobials:  Anti-infectives (From admission, onward)    None       Subjective:  Patient seen and examined at bedside today.  Hemodynamically stable , comfortable.  Denies any chest pain today.  On baseline oxygen requirement.  No new complaints  Objective: Vitals:   01/01/23 0547 01/01/23 0656 01/01/23 0810 01/01/23 0946  BP:  102/65 (!) 85/47 98/61  Pulse:  (!) 104 (!) 101   Resp: 17 20 16    Temp:  98 F (36.7 C) 97.6 F (36.4 C)   TempSrc:  Oral    SpO2:  94% 100%   Weight: 57.3 kg     Height:        Intake/Output Summary (Last 24 hours) at 01/01/2023 1024 Last data filed at 01/01/2023 0700 Gross per 24 hour  Intake 240 ml  Output 20 ml  Net 220 ml   Filed Weights   12/30/22 0500 12/31/22 0632 01/01/23 0547  Weight: 59.4 kg 56.7 kg 57.3 kg  Examination:  General exam: Overall comfortable, not in distress,thin built HEENT: PERRL Respiratory system: Diminished sounds on the left side, left-sided chest tube Cardiovascular system: sinus tach Gastrointestinal system: Abdomen is nondistended, soft and nontender. Central nervous system: Alert and oriented Extremities: No edema, no clubbing ,no cyanosis Skin: No rashes, no ulcers,no icterus     Data Reviewed: I have personally reviewed following labs and imaging  studies  CBC: Recent Labs  Lab 12/26/22 0407 12/27/22 0420 12/29/22 0931 12/29/22 1845 12/30/22 1122  WBC 11.6* 10.9* 7.1 5.4 4.8  NEUTROABS  --   --  5.7  --   --   HGB 12.0* 11.8* 12.5* 11.5* 11.7*  HCT 38.7* 38.1* 38.2* 35.7* 36.4*  MCV 94.2 93.6 89.9 89.3 87.3  PLT 277 220 184 164 160   Basic Metabolic Panel: Recent Labs  Lab 12/26/22 0407 12/27/22 0420 12/29/22 0931 12/29/22 1845 12/30/22 1122  NA 139 134* 134*  --  133*  K 4.5 4.4 4.0  --  4.0  CL 99 96* 97*  --  96*  CO2 34* 32 27  --  30  GLUCOSE 90 102* 122*  --  120*  BUN 27* 26* 12  --  13  CREATININE 0.73 0.71 0.57* 0.56* 0.64  CALCIUM 9.3 9.1 9.4  --  9.1  MG  --   --  1.9  --  2.0  PHOS  --   --   --   --  3.6     Recent Results (from the past 240 hour(s))  Culture, blood (routine x 2)     Status: None   Collection Time: 12/23/22 10:20 AM   Specimen: Right Antecubital; Blood  Result Value Ref Range Status   Specimen Description   Final    RIGHT ANTECUBITAL BOTTLES DRAWN AEROBIC AND ANAEROBIC   Special Requests   Final    Blood Culture results may not be optimal due to an excessive volume of blood received in culture bottles   Culture   Final    NO GROWTH 5 DAYS Performed at [facility], [address] [place], [place] [address]    Report Status 12/28/2022 FINAL  Final  Culture, blood (routine x 2)     Status: None   Collection Time: 12/23/22 10:20 AM   Specimen: Left Antecubital; Blood  Result Value Ref Range Status   Specimen Description   Final    LEFT ANTECUBITAL BOTTLES DRAWN AEROBIC AND ANAEROBIC   Special Requests Blood Culture adequate volume  Final   Culture   Final    NO GROWTH 5 DAYS Performed at [facility], [address] [place], [place] [address]    Report Status 12/28/2022 FINAL  Final  SARS Coronavirus 2 by RT PCR (hospital order, performed in [facility] hospital lab) *cepheid single result test* Anterior Nasal Swab     Status: None   Collection Time: 12/23/22 10:32 AM    Specimen: Anterior Nasal Swab  Result Value Ref Range Status   SARS Coronavirus 2 by RT PCR NEGATIVE NEGATIVE Final    Comment: (NOTE) SARS-CoV-2 target nucleic acids are NOT DETECTED.  The SARS-CoV-2 RNA is generally detectable in upper and lower respiratory specimens during the acute phase of infection. The lowest concentration of SARS-CoV-2 viral copies this assay can detect is 250 copies / mL. A negative result does not preclude SARS-CoV-2 infection and should not be used as the sole basis for treatment or other patient management decisions.  A negative result may occur  with improper specimen collection / handling, submission of specimen other than nasopharyngeal swab, presence of viral mutation(s) within the areas targeted by this assay, and inadequate number of viral copies (<250 copies / mL). A negative result must be combined with clinical observations, patient history, and epidemiological information.  Fact Sheet for Patients:   RoadLapTop.co.za  Fact Sheet for Healthcare Providers: http://kim-miller.com/  This test is not yet approved or  cleared by the [place] FDA and has been authorized for detection and/or diagnosis of SARS-CoV-2 by FDA under an Emergency Use Authorization (EUA).  This EUA will remain in effect (meaning this test can be used) for the duration of the COVID-19 declaration under Section 564(b)(1) of the Act, 21 U.S.C. section 360bbb-3(b)(1), unless the authorization is terminated or revoked sooner.  Performed at [facility], [address], [place], [place] [address]      Radiology Studies: [facility] Chest [place] 1 View  Result Date: 01/01/2023 CLINICAL DATA:  62 year old male with a history of small cell lung cancer on chemotherapy. Recent pneumothorax, hospitalization. Pneumothorax recurrence, chest tube in place. EXAM: PORTABLE CHEST 1 VIEW COMPARISON:  Portable chest 12/31/2022 and earlier. FINDINGS:  Portable AP upright views at 0742 hours. Stable pigtail left chest tube. Residual small left apical pneumothorax, volume decreased compared to yesterday. Pleural edge no longer visible at the costophrenic angle. Otherwise stable ventilation. Abnormal right mediastinal contours. Left chest Port-A-Cath. Diffuse coarse pulmonary interstitial opacity. Visualized tracheal air column is within normal limits. Stable cholecystectomy clips. Negative visible bowel gas. IMPRESSION: 1. Stable left chest tube with decreased pneumothorax from yesterday. Small residual in the apex. 2. Otherwise stable ventilation. Abnormal lungs and right mediastinal contours, see details on Chest CT 12/29/2022. Electronically Signed   By: Odessa Fleming M.D.   On: 01/01/2023 09:36   DG Chest Port 1 View  Result Date: 12/31/2022 CLINICAL DATA:  Chest tube surveillance EXAM: PORTABLE CHEST 1 VIEW COMPARISON:  12/30/2022 FINDINGS: Enlarging left pneumothorax (estimated at approximately 25%). Left sided pigtail pleural drainage catheter is position within the left mid lung. No abnormal shift of the heart or mediastinum. Left chest port remains in place. Known right lower lobe mass. Background of interstitial lung disease. No right-sided pneumothorax. IMPRESSION: Enlarging left pneumothorax (estimated at approximately 25%). Left sided chest tube remains in place. These results will be called to the ordering clinician or representative by the Radiologist Assistant, and communication documented in the PACS or [employer]. Electronically Signed   By: Duanne Guess D.O.   On: 12/31/2022 09:33    Scheduled Meds:  busPIRone  5 mg Oral TID   Chlorhexidine Gluconate Cloth  6 each Topical Daily   enoxaparin (LOVENOX) injection  40 mg Subcutaneous Q24H   lidocaine  1 patch Transdermal Q24H   metoprolol tartrate  12.5 mg Oral BID   oxyCODONE  20 mg Oral Q12H   sodium chloride flush  10 mL Intrapleural Q8H   Continuous Infusions:   LOS: 3 days    Burnadette Pop, MD [facility] P6/09/2022, 10:24 AM

## 2023-01-02 ENCOUNTER — Inpatient Hospital Stay (HOSPITAL_COMMUNITY): Payer: 59 | Admitting: Certified Registered Nurse Anesthetist

## 2023-01-02 ENCOUNTER — Inpatient Hospital Stay (HOSPITAL_COMMUNITY): Payer: 59

## 2023-01-02 ENCOUNTER — Ambulatory Visit (HOSPITAL_COMMUNITY): Admit: 2023-01-02 | Payer: 59 | Admitting: Pulmonary Disease

## 2023-01-02 ENCOUNTER — Encounter (HOSPITAL_COMMUNITY): Payer: Self-pay | Admitting: Pulmonary Disease

## 2023-01-02 ENCOUNTER — Encounter (HOSPITAL_COMMUNITY)
Admission: EM | Disposition: A | Discharge status: Home / Self Care | Payer: Self-pay | Source: Home / Self Care | Attending: Internal Medicine

## 2023-01-02 DIAGNOSIS — J939 Pneumothorax, unspecified: Secondary | ICD-10-CM

## 2023-01-02 DIAGNOSIS — J9382 Other air leak: Secondary | ICD-10-CM

## 2023-01-02 DIAGNOSIS — I10 Essential (primary) hypertension: Secondary | ICD-10-CM | POA: Diagnosis not present

## 2023-01-02 DIAGNOSIS — J449 Chronic obstructive pulmonary disease, unspecified: Secondary | ICD-10-CM | POA: Diagnosis not present

## 2023-01-02 DIAGNOSIS — C3491 Malignant neoplasm of unspecified part of right bronchus or lung: Secondary | ICD-10-CM | POA: Diagnosis not present

## 2023-01-02 DIAGNOSIS — J9312 Secondary spontaneous pneumothorax: Secondary | ICD-10-CM | POA: Diagnosis not present

## 2023-01-02 DIAGNOSIS — L8990 Pressure ulcer of unspecified site, unspecified stage: Secondary | ICD-10-CM | POA: Insufficient documentation

## 2023-01-02 DIAGNOSIS — Z87891 Personal history of nicotine dependence: Secondary | ICD-10-CM

## 2023-01-02 HISTORY — PX: BRONCHIAL WASHINGS: SHX5105

## 2023-01-02 HISTORY — PX: VIDEO BRONCHOSCOPY: SHX5072

## 2023-01-02 HISTORY — PX: INSERTION OF IBV VALVE: SHX5091

## 2023-01-02 HISTORY — PX: BRONCHIAL BIOPSY: SHX5109

## 2023-01-02 LAB — CBC
HCT: 33.5 % — ABNORMAL LOW (ref 39.0–52.0)
Hemoglobin: 10.7 g/dL — ABNORMAL LOW (ref 13.0–17.0)
MCH: 27.9 pg (ref 26.0–34.0)
MCHC: 31.9 g/dL (ref 30.0–36.0)
MCV: 87.2 fL (ref 80.0–100.0)
Platelets: 175 10*3/uL (ref 150–400)
RBC: 3.84 MIL/uL — ABNORMAL LOW (ref 4.22–5.81)
RDW: 13.7 % (ref 11.5–15.5)
WBC: 5.1 10*3/uL (ref 4.0–10.5)
nRBC: 0 % (ref 0.0–0.2)

## 2023-01-02 LAB — BASIC METABOLIC PANEL
Anion gap: 10 (ref 5–15)
BUN: 11 mg/dL (ref 6–20)
CO2: 27 mmol/L (ref 22–32)
Calcium: 9 mg/dL (ref 8.9–10.3)
Chloride: 99 mmol/L (ref 98–111)
Creatinine, Ser: 0.74 mg/dL (ref 0.61–1.24)
GFR, Estimated: >60 mL/min (ref >60)
Glucose, Bld: 124 mg/dL — ABNORMAL HIGH (ref 70–99)
Potassium: 3.9 mmol/L (ref 3.5–5.1)
Sodium: 136 mmol/L (ref 135–145)

## 2023-01-02 LAB — SURGICAL PCR SCREEN
MRSA, PCR: NEGATIVE
Staphylococcus aureus: NEGATIVE

## 2023-01-02 LAB — SARS CORONAVIRUS 2 BY RT PCR: SARS Coronavirus 2 by RT PCR: NEGATIVE

## 2023-01-02 SURGERY — INSERTION, INTRABRONCHIAL VALVE, SPIRATION
Anesthesia: General | Laterality: Left

## 2023-01-02 MED ORDER — LIDOCAINE 2% (20 MG/ML) 5 ML SYRINGE
INTRAMUSCULAR | Status: DC | PRN
  Administered 2023-01-02: 60 mg via INTRAVENOUS

## 2023-01-02 MED ORDER — PROPOFOL 10 MG/ML IV BOLUS
INTRAVENOUS | Status: DC | PRN
  Administered 2023-01-02: 90 mg via INTRAVENOUS

## 2023-01-02 MED ORDER — SUGAMMADEX SODIUM 200 MG/2ML IV SOLN
INTRAVENOUS | Status: DC | PRN
  Administered 2023-01-02: 200 mg via INTRAVENOUS

## 2023-01-02 MED ORDER — PHENYLEPHRINE HCL (PRESSORS) 10 MG/ML IV SOLN
INTRAVENOUS | Status: DC | PRN
  Administered 2023-01-02 (×3): 160 ug via INTRAVENOUS

## 2023-01-02 MED ORDER — SUCCINYLCHOLINE CHLORIDE 200 MG/10ML IV SOSY
PREFILLED_SYRINGE | INTRAVENOUS | Status: DC | PRN
  Administered 2023-01-02: 80 mg via INTRAVENOUS

## 2023-01-02 MED ORDER — BENZONATATE 100 MG PO CAPS
200.0000 mg | ORAL_CAPSULE | Freq: Three times a day (TID) | ORAL | Status: DC | PRN
  Administered 2023-01-02 – 2023-01-03 (×2): 200 mg via ORAL
  Filled 2023-01-02 (×2): qty 2

## 2023-01-02 MED ORDER — FENTANYL CITRATE (PF) 100 MCG/2ML IJ SOLN
25.0000 ug | INTRAMUSCULAR | Status: DC | PRN
  Administered 2023-01-02: 50 ug via INTRAVENOUS

## 2023-01-02 MED ORDER — LACTATED RINGERS IV SOLN: INTRAVENOUS | Status: DC

## 2023-01-02 MED ORDER — FENTANYL CITRATE (PF) 100 MCG/2ML IJ SOLN
INTRAMUSCULAR | Status: AC
  Filled 2023-01-02: qty 2

## 2023-01-02 MED ORDER — MIDAZOLAM HCL 5 MG/5ML IJ SOLN
INTRAMUSCULAR | Status: DC | PRN
  Administered 2023-01-02: 1 mg via INTRAVENOUS

## 2023-01-02 MED ORDER — FENTANYL CITRATE (PF) 250 MCG/5ML IJ SOLN
INTRAMUSCULAR | Status: DC | PRN
  Administered 2023-01-02: 50 ug via INTRAVENOUS

## 2023-01-02 MED ORDER — ONDANSETRON HCL 4 MG/2ML IJ SOLN
INTRAMUSCULAR | Status: DC | PRN
  Administered 2023-01-02: 4 mg via INTRAVENOUS

## 2023-01-02 MED ORDER — ROCURONIUM BROMIDE 10 MG/ML (PF) SYRINGE
PREFILLED_SYRINGE | INTRAVENOUS | Status: DC | PRN
  Administered 2023-01-02: 50 mg via INTRAVENOUS

## 2023-01-02 SURGICAL SUPPLY — 2 items
Interbronchial valve IMPLANT
Interbronchial valves IMPLANT

## 2023-01-02 NOTE — Interval H&P Note (Signed)
History and Physical Interval Note:  01/02/2023 11:51 AM  [name]  has presented today for surgery, with the diagnosis of Persistent pneumonthorax, persistent airleak.  The various methods of treatment have been discussed with the patient and family. After consideration of risks, benefits and other options for treatment, the patient has consented to  Procedure(s): INSERTION OF INTERBRONCHIAL VALVE (IBV) (Left) VIDEO BRONCHOSCOPY WITH FLUORO (Left) as a surgical intervention.  The patient's history has been reviewed, patient examined, no change in status, stable for surgery.  I have reviewed the patient's chart and labs.  Questions were answered to the patient's satisfaction.     Rachel Bo Adelbert Gaspard

## 2023-01-02 NOTE — Progress Notes (Signed)
PCCM:  Would leave chest tube at water seal.  Repeat CXR in the AM.  There is a chance that his ptx will stay persistent and loculated. That is okay. As long as it is not expanding can leave him on waterseal to avoid excess negative pressure in the pleural space.   Josephine Igo, DO [facility] 01/02/2023 6:09 PM

## 2023-01-02 NOTE — Anesthesia Preprocedure Evaluation (Addendum)
Anesthesia Evaluation  Patient identified by MRN, date of birth, ID band Patient awake    Reviewed: Allergy & Precautions, NPO status , Patient's Chart, lab work & pertinent test results, reviewed documented beta blocker date and time   History of Anesthesia Complications Negative for: history of anesthetic complications  Airway Mallampati: II  TM Distance: >3 FB Neck ROM: Full    Dental  (+) Poor Dentition, Partial Lower, Partial Upper   Pulmonary asthma , COPD, former smoker Persistent pneumonthorax, persistent airleak   Pulmonary exam normal        Cardiovascular hypertension, Pt. on medications and Pt. on home beta blockers + CAD  Normal cardiovascular exam     Neuro/Psych  Headaches  Anxiety Depression    ALS TIA   GI/Hepatic Neg liver ROS,GERD  Medicated,,  Endo/Other  negative endocrine ROS    Renal/GU negative Renal ROS  negative genitourinary   Musculoskeletal  (+) Arthritis ,    Abdominal   Peds  Hematology  (+) Blood dyscrasia (Hgb 10.7), anemia   Anesthesia Other Findings Day of surgery medications reviewed with patient.  Reproductive/Obstetrics negative OB ROS                             Anesthesia Physical Anesthesia Plan  ASA: 3  Anesthesia Plan: General   Post-op Pain Management: Minimal or no pain anticipated   Induction: Intravenous  PONV Risk Score and Plan: 2 and Treatment may vary due to age or medical condition, Ondansetron and Midazolam  Airway Management Planned: Oral ETT  Additional Equipment: None  Intra-op Plan:   Post-operative Plan: Extubation in OR  Informed Consent: I have reviewed the patients History and Physical, chart, labs and discussed the procedure including the risks, benefits and alternatives for the proposed anesthesia with the patient or authorized representative who has indicated his/her understanding and acceptance.     Dental  advisory given  Plan Discussed with: CRNA  Anesthesia Plan Comments:        Anesthesia Quick Evaluation

## 2023-01-02 NOTE — Anesthesia Postprocedure Evaluation (Signed)
Anesthesia Post Note  Patient: Bradley Rice  Procedure(s) Performed: INSERTION OF INTERBRONCHIAL VALVE (IBV) (Left) BRONCHIAL BIOPSIES BRONCHIAL WASHINGS VIDEO BRONCHOSCOPY WITHOUT FLUORO     Patient location during evaluation: PACU Anesthesia Type: General Level of consciousness: awake and alert Pain management: pain level controlled Vital Signs Assessment: post-procedure vital signs reviewed and stable Respiratory status: spontaneous breathing, nonlabored ventilation and respiratory function stable Cardiovascular status: blood pressure returned to baseline Postop Assessment: no apparent nausea or vomiting Anesthetic complications: no   No notable events documented.  Last Vitals:  Vitals:   01/02/23 1320 01/02/23 1330  BP: 96/61 97/69  Pulse: 98 91  Resp: 20 19  Temp:    SpO2: 92% 95%    Last Pain:  Vitals:   01/02/23 1400  TempSrc:   PainSc: 8                  Shanda Howells

## 2023-01-02 NOTE — Transfer of Care (Signed)
Immediate Anesthesia Transfer of Care Note  Patient: [name]  Procedure(s) Performed: INSERTION OF INTERBRONCHIAL VALVE (IBV) (Left) VIDEO BRONCHOSCOPY WITH FLUORO (Left) BRONCHIAL BIOPSIES BRONCHIAL WASHINGS  Patient Location: Endoscopy Unit  Anesthesia Type:General  Level of Consciousness: awake, alert , patient cooperative, and responds to stimulation  Airway & Oxygen Therapy: Patient Spontanous Breathing and Patient connected to face mask oxygen  Post-op Assessment: Report given to RN and Post -op Vital signs reviewed and stable  Post vital signs: Reviewed and stable  Last Vitals:  Vitals Value Taken Time  BP 107/86 01/02/23 1250  Temp 36.5 C 01/02/23 1248  Pulse 99 01/02/23 1252  Resp 30 01/02/23 1252  SpO2 100 % 01/02/23 1252  Vitals shown include unvalidated device data.  Last Pain:  Vitals:   01/02/23 1248  TempSrc: Temporal  PainSc: Asleep      Patients Stated Pain Goal: 0 (01/02/23 0343)  Complications: No notable events documented.

## 2023-01-02 NOTE — Op Note (Signed)
Video Bronchoscopy Procedure Note and endobronchial valve placement  Date of Operation: 01/02/2023  Pre-op Diagnosis: Left persistent pneumothorax and air leak  Post-op Diagnosis: Left persistent pneumothorax and air leak  Surgeon: Josephine Igo, DO   Assistants: none  Anesthesia: general   Operation: Flexible video fiberoptic bronchoscopy and biopsies.  Estimated Blood Loss: <1cc  Complications: none noted  Indications and History: Bradley Rice is 62 y.o. with history of left persistent pneumothorax, persistent left airleak.  Recommendation was to perform video fiberoptic bronchoscopy with biopsies. The risks, benefits, complications, treatment options and expected outcomes were discussed with the patient.  The possibilities of pneumothorax, pneumonia, reaction to medication, pulmonary aspiration, perforation of a viscus, bleeding, failure to diagnose a condition and creating a complication requiring transfusion or operation were discussed with the patient who freely signed the consent.    Description of Procedure: The patient was seen in the Preoperative Area, was examined and was deemed appropriate to proceed.  The patient was taken to [facility] endoscopy room 3, identified as Bradley Rice and the procedure verified as Flexible Video Fiberoptic Bronchoscopy.  A Time Out was held and the above information confirmed.   Conscious sedation was initiated as indicated above. The video fiberoptic bronchoscope was introduced via the L nare and a general inspection was performed which showed normal cords, normal trachea, normal main carina. The R sided airways were inspected and showed normal RUL, BI, RML and RLL. The L side was then inspected. The LLL, Lingular and LUL airways were normal.  The airways were normal except for the right mainstem which showed potential tumor infiltration with known history of malignancy.  Using the Olympus spiration valve system the balloon catheter was  calibrated to the appropriate size.  The patient's pneumothorax is persistent within the apex and the chest tube was placed along the anterior wall.  With the valve closed on the chest tube there was no significant leak we attempted to cause the airleak to persist by adding positive pressure.  However minimal bubbles were obtained in the chamber.  Decision was made due to the localization of the leak and always being within the left upper lobe for occlusion with valves in the LB 1 and LB 2 locations.  This was done effectively empirically due to his recurrent hospitalizations for pneumothorax.  As well as the location for the most presence of apical disease.  Both locations were measured at 9 mm and a 9 mm valve was deployed under bronchoscopic guidance to both operative vision subsegments.  Both valves were placed appropriately and fully occluded the opening segments.  We then turned our attention to the irregular mucosa in the right mainstem.  Using [facility] Scientific forceps we obtained endobronchial biopsies.  These to be sent for pathology evaluation.  Also use saline for irrigation left mainstem and collection to be sent for cytology evaluation.   Samples: 1. Endobronchial forceps biopsies from right main stem  2. Bronchial washings from Right main stem   Plans:  We will review the cytology, pathology results with the patient when they become available.  Outpatient followup will be with Dr Tonia Brooms.   Patient will need follow-up in clinic.  Consideration for repeat bronchoscopy and 6-8 weeks for removal of endobronchial valve as long as he has resolution of his pneumothorax.  Josephine Igo, DO [facility] 01/02/2023 12:47 PM

## 2023-01-02 NOTE — Anesthesia Procedure Notes (Signed)
Procedure Name: Intubation Date/Time: 01/02/2023 12:10 PM  Performed by: Margarita Rana, CRNAPre-anesthesia Checklist: Patient identified, Patient being monitored, Timeout performed, Emergency Drugs available and Suction available Patient Re-evaluated:Patient Re-evaluated prior to induction Oxygen Delivery Method: Circle System Utilized Preoxygenation: Pre-oxygenation with 100% oxygen Induction Type: IV induction Ventilation: Mask ventilation without difficulty Laryngoscope Size: Glidescope and 4 Grade View: Grade I Tube type: Oral Tube size: 8.5 mm Number of attempts: 1 Airway Equipment and Method: Video-laryngoscopy Placement Confirmation: ETT inserted through vocal cords under direct vision, positive ETCO2 and breath sounds checked- equal and bilateral Secured at: 22 cm Tube secured with: Tape Dental Injury: Teeth and Oropharynx as per pre-operative assessment  Comments: Intubated by D. Belliston-Fowkes MD

## 2023-01-02 NOTE — Progress Notes (Signed)
PROGRESS NOTE  Bradley Rice  ZOX:[id number] DOB: 1961/02/20 DOA: 12/29/2022 PCP: [name], FNP   Brief Narrative: Patient is a 62 year old male with history of advanced small cell lung cancer, recent pneumothorax requiring chest tube from 5/24 to 5/28, ILD, chronic hypoxic respite failure on 6 days of oxygen who presented to [facility] emergency department with complaint of shortness of breath.  On condition he was found to have absent lung sounds on the left, imaging consistent with recurrence of left pneumothorax.  Status post chest tube placement.  Transferred to [facility].  PCCM consulted and managing the chest tube.Plan for bronchoscopy today  Assessment & Plan:  Principal Problem:   Pneumothorax Active Problems:   Recurrent pneumothorax after chest tube removed  Recurrent left-sided pneumothorax:   He was recently admitted at [facility] for pneumothorax, was on chest tube from 5/24 to 5/28.  Status post chest tube placement.  PCCM following.  PCCM planning for  bronchoscopy for endobronchial valve placement today. CXR on 6/2 shows  small residual left pneumothorax on apex,better than before.  Chronic hypoxic respiratory failure: On 6 L of oxygen at baseline.  Has history of COPD, ILD.  Right lung small cell lung cancer: Imagings  show stable right lower lobe lung mass, extensive mediastinal/hilar metastatic nodal disease.  Currently on chemotherapy.  Follows with Dr. Ellin Saba  Interstitial lung disease: PCCM following.  Chronic pain syndrome: On OxyContin, breakthrough oxycodone  Anxiety: Continue Home BuSpar and duloxetine  Sinus tachycardia: Patient says it is a  chronic problem. Recent TSH is normal.  On metoprolol      Pressure Injury 12/29/22 Coccyx Upper Stage 2 -  Partial thickness loss of dermis presenting as a shallow open injury with a red, pink wound bed without slough. pin point size (Active)  12/29/22 1609  Location: Coccyx  Location Orientation: Upper   Staging: Stage 2 -  Partial thickness loss of dermis presenting as a shallow open injury with a red, pink wound bed without slough.  Wound Description (Comments): pin point size  Present on Admission: Yes  Dressing Type Foam - Lift dressing to assess site every shift 01/01/23 1941    DVT prophylaxis:enoxaparin (LOVENOX) injection 40 mg Start: 12/29/22 2200 SCDs Start: 12/29/22 1831     Code Status: Full Code  Family Communication: Wife at bedside  Patient status:Inpatient  Patient is from :Home  Anticipated discharge WJ:XBJY  Estimated DC date:after chest tube removal,PCCM clearance   Consultants: PCCM  Procedures:chest tube placement  Antimicrobials:  Anti-infectives (From admission, onward)    None       Subjective:  Patient seen and examined the bedside today.  Hemodynamically stable.  He was on room air this morning.  He says he feels better.  Denies any shortness of breath or chest pain.  Waiting for bronchoscopy  Objective: Vitals:   01/02/23 0339 01/02/23 0517 01/02/23 0932 01/02/23 1050  BP: 100/78  (!) 93/54 91/65  Pulse: (!) 112  100 100  Resp: 19 14 20 18   Temp: 98 F (36.7 C)  97.9 F (36.6 C) 97.7 F (36.5 C)  TempSrc: Oral  Oral Temporal  SpO2: 93%  94% 93%  Weight: 58.2 kg   56.7 kg  Height:    6' (1.829 m)    Intake/Output Summary (Last 24 hours) at 01/02/2023 1149 Last data filed at 01/01/2023 2000 Gross per 24 hour  Intake 10 ml  Output 10 ml  Net 0 ml   [employer]   01/01/23  1610 01/02/23 0339 01/02/23 1050  Weight: 57.3 kg 58.2 kg 56.7 kg    Examination:  General exam: Overall comfortable, not in distress,thin built HEENT: PERRL Respiratory system:  no wheezes or crackles , diminished air entry on the left side.  Left-sided chest tube Cardiovascular system: S1 & S2 heard, RRR.  Gastrointestinal system: Abdomen is nondistended, soft and nontender. Central nervous system: Alert and oriented Extremities: No edema, no  clubbing ,no cyanosis Skin: No rashes, no ulcers,no icterus     Data Reviewed: I have personally reviewed following labs and imaging studies  CBC: Recent Labs  Lab 12/27/22 0420 12/29/22 0931 12/29/22 1845 12/30/22 1122 01/02/23 0140  WBC 10.9* 7.1 5.4 4.8 5.1  NEUTROABS  --  5.7  --   --   --   HGB 11.8* 12.5* 11.5* 11.7* 10.7*  HCT 38.1* 38.2* 35.7* 36.4* 33.5*  MCV 93.6 89.9 89.3 87.3 87.2  PLT 220 184 164 160 175   Basic Metabolic Panel: Recent Labs  Lab 12/27/22 0420 12/29/22 0931 12/29/22 1845 12/30/22 1122 01/02/23 0140  NA 134* 134*  --  133* 136  K 4.4 4.0  --  4.0 3.9  CL 96* 97*  --  96* 99  CO2 32 27  --  30 27  GLUCOSE 102* 122*  --  120* 124*  BUN 26* 12  --  13 11  CREATININE 0.71 0.57* 0.56* 0.64 0.74  CALCIUM 9.1 9.4  --  9.1 9.0  MG  --  1.9  --  2.0  --   PHOS  --   --   --  3.6  --      Recent Results (from the past 240 hour(s))  SARS Coronavirus 2 by RT PCR (hospital order, performed in [facility] hospital lab) *cepheid single result test* Anterior Nasal Swab     Status: None   Collection Time: 01/02/23  8:06 AM   Specimen: Anterior Nasal Swab  Result Value Ref Range Status   SARS Coronavirus 2 by RT PCR NEGATIVE NEGATIVE Final    Comment: Performed at [facility], [address], [place], [place] [address]  Surgical pcr screen     Status: None   Collection Time: 01/02/23  8:06 AM   Specimen: Nasal Mucosa; Nasal Swab  Result Value Ref Range Status   MRSA, PCR NEGATIVE NEGATIVE Final   Staphylococcus aureus NEGATIVE NEGATIVE Final    Comment: (NOTE) The Xpert SA Assay (FDA approved for NASAL specimens in patients 46 years of age and older), is one component of a comprehensive surveillance program. It is not intended to diagnose infection nor to guide or monitor treatment. Performed at [facility], [address] [place], [place] [address]      Radiology Studies: DG CHEST PORT 1 VIEW  Result Date:  01/02/2023 CLINICAL DATA:  [id number] Pneumothorax [id number] EXAM: PORTABLE CHEST 1 VIEW COMPARISON:  CXR 01/01/23 FINDINGS: Left-sided pleural pigtail drainage catheter in place with unchanged positioning. Left-sided needle access chest port in place with tip near the cavoatrial junction. There is persistent small left apical pneumothorax, unchanged from prior exam. Redemonstrated extensive bilateral interstitial opacities, favored to represent changes related to patient's underlying fibrotic lung disease. Unchanged cardiac and mediastinal contours. Unchanged nodular opacity at the right lung base in the right perihilar region. IMPRESSION: Unchanged small left apical pneumothorax with left-sided pleural pigtail drainage catheter in place. Electronically Signed   By: Lorenza Cambridge M.D.   On: 01/02/2023 07:57  DG Chest Port 1 View  Result Date: 01/01/2023 CLINICAL DATA:  62 year old male with a history of small cell lung cancer on chemotherapy. Recent pneumothorax, hospitalization. Pneumothorax recurrence, chest tube in place. EXAM: PORTABLE CHEST 1 VIEW COMPARISON:  Portable chest 12/31/2022 and earlier. FINDINGS: Portable AP upright views at 0742 hours. Stable pigtail left chest tube. Residual small left apical pneumothorax, volume decreased compared to yesterday. Pleural edge no longer visible at the costophrenic angle. Otherwise stable ventilation. Abnormal right mediastinal contours. Left chest Port-A-Cath. Diffuse coarse pulmonary interstitial opacity. Visualized tracheal air column is within normal limits. Stable cholecystectomy clips. Negative visible bowel gas. IMPRESSION: 1. Stable left chest tube with decreased pneumothorax from yesterday. Small residual in the apex. 2. Otherwise stable ventilation. Abnormal lungs and right mediastinal contours, see details on Chest CT 12/29/2022. Electronically Signed   By: Odessa Fleming M.D.   On: 01/01/2023 09:36    Scheduled Meds:  [MAR Hold] busPIRone  5 mg Oral TID   [MAR  Hold] Chlorhexidine Gluconate Cloth  6 each Topical Daily   [MAR Hold] enoxaparin (LOVENOX) injection  40 mg Subcutaneous Q24H   [MAR Hold] lidocaine  1 patch Transdermal Q24H   [MAR Hold] metoprolol tartrate  12.5 mg Oral BID   [MAR Hold] oxyCODONE  20 mg Oral Q12H   [MAR Hold] sodium chloride flush  10 mL Intrapleural Q8H   Continuous Infusions:  lactated ringers 10 mL/hr at 01/02/23 1052     LOS: 4 days   Burnadette Pop, MD [facility] P6/09/2022, 11:49 AM

## 2023-01-03 ENCOUNTER — Telehealth: Payer: Self-pay | Admitting: Physician Assistant

## 2023-01-03 ENCOUNTER — Inpatient Hospital Stay (HOSPITAL_COMMUNITY): Payer: 59

## 2023-01-03 DIAGNOSIS — J9311 Primary spontaneous pneumothorax: Secondary | ICD-10-CM | POA: Diagnosis not present

## 2023-01-03 DIAGNOSIS — J9312 Secondary spontaneous pneumothorax: Secondary | ICD-10-CM | POA: Diagnosis not present

## 2023-01-03 MED ORDER — HEPARIN SOD (PORK) LOCK FLUSH 100 UNIT/ML IV SOLN
500.0000 [IU] | INTRAVENOUS | Status: AC | PRN
  Administered 2023-01-03: 500 [IU]

## 2023-01-03 MED ORDER — METOPROLOL TARTRATE 25 MG PO TABS: 12.5000 mg | ORAL_TABLET | Freq: Two times a day (BID) | ORAL | 0 refills | Status: DC

## 2023-01-03 MED ORDER — OXYCODONE HCL 15 MG PO TABS: 15.0000 mg | ORAL_TABLET | Freq: Four times a day (QID) | ORAL | 0 refills | Status: DC

## 2023-01-03 NOTE — Progress Notes (Signed)
NAME:  Bradley Rice, MRN:  [id number], DOB:  24-Dec-1960, LOS: 5 ADMISSION DATE:  12/29/2022, CONSULTATION DATE:  12/29/22 REFERRING MD:  Estell Harpin [facility] EM, CHIEF COMPLAINT:  recurrent L ptx    History of Present Illness:  62 yo M PMH extensive stage small cell lung cancer, recent ptx requiring chest tube 5/24-28, ILD, chronic hypoxic resp failure on 6L who presented to [facility] ED with SOB. Pt first was seen at cancer center, where he reported SOB x24 hrs, but had improved. L lung sounds were absent on oncs exam and cxr w recurrence of L ptx. Referred to ED emergently in this setting, and chest tube placed.   PCCM is consulted in this setting.   In chart review of recent hospitalization, looks like slightly complex situation- intermittent air leak w forced exhalation and at times failing clamping trials with incr in ptx size. Ultimately conservative mgmnt was pursued and pt was able to have chest tube removed, with close follow up and plan for admission to [facility] in event of recurrence for EBV evaluation / CVTS consult.   Pertinent  Medical History  Extensive stage small cell lung cancer COPD ILD Chronic hypoxia Anorexia   Significant Hospital Events: Including procedures, antibiotic start and stop dates in addition to other pertinent events   5/24-28 chest tube during admission at [facility] for ptx, dc home 5/28 5/30 recurrence of L ptx found at onc appointment. To [facility] ED, chest tube placed. Transfer to [facility]. PCCM consult   CT chest: 1. Interval placement of a left anterolateral pigtail chest tube.Tiny residual left anteroapical pneumothorax.2. Stable right lower lobe mass in keeping with a primary bronchogenic neoplasm in a background of underlying interstitial lung disease. Stable extensive mediastinal and right hilar metastatic nodal disease. 3. UIP pattern fibrotic interstitial lung disease. 4. Moderate multi-vessel coronary artery calcification.5. Morphologic changes in keeping with pulmonary arterial  hypertension. 6/3 OR for EBV on left and chest tube placement 6/4 chest tube removed  Interim History / Subjective:  Eager to go home. CXR stable with small left apical PTX   Objective   Blood pressure 99/69, pulse (!) 115, temperature 98 F (36.7 C), temperature source Oral, resp. rate 14, height 6' (1.829 m), weight 127 kg, SpO2 98 %.        Intake/Output Summary (Last 24 hours) at 01/03/2023 1037 Last data filed at 01/02/2023 1700 Gross per 24 hour  Intake 240 ml  Output --  Net 240 ml   Filed Weights   01/02/23 0339 01/02/23 1050 01/03/23 0500  Weight: 58.2 kg 56.7 kg 127 kg    Examination: General: thin frail male, sitting on edge of bed, eager to go home HENT temporal muscle wasting  Pulm: diminshed breath sounds bilaterally. L chest tube in place, no air leak Card: RRR, no M/R/G Abd: soft nt nd  Ext: thin, muscle wasting   Neuro: alert oriented following commands   Resolved Hospital Problem list     Assessment & Plan:   Recurrent L ptx, secondary - s/p chest tube and EBV placement 6/3 Extensive stage small cell lung cancer -Currently undergoing chemotherapy Chronic hypoxic resp failure  History of COPD, pulmonary fibrosis ILD Essential hypertension Chronic pain Depression/anxiety Recurrent L ptx, secondary Chronic hypoxic resp failure  History of COPD, pulmonary fibrosis Essential hypertension Chronic pain Depression/anxiety  Recurrent left ptx - s/p chest tube and EBV placement 6/3. Repeat CXR this AM stable. - OK to d/c chest tube. - F/u CXR today at 1500.  If stable, OK to d/c home from our standpoint. - Message sent to our office to arrange for outpatient follow up in the next 6 weeks with Dr. Tonia Brooms.    Rutherford Guys, PA - [facility] For pager details, please see AMION or use Epic chat  After 1900, please call [facility] for cross coverage needs 01/03/2023, 10:41 AM

## 2023-01-03 NOTE — Progress Notes (Signed)
Discharge instructions given. Patient verbalized understanding and all questions were answered.  ?

## 2023-01-03 NOTE — Telephone Encounter (Signed)
Is fine for him to follow with [facility]

## 2023-01-03 NOTE — Progress Notes (Signed)
Left pigtail chest tube removed per order. Pt tolerated procedure well. Pt aware chest xray order for 3 pm. Pt continues to ask about discharge. Pt aware CXR will need to be read by PCCM. Pt resting with call bell within reach.  Will continue to monitor.

## 2023-01-03 NOTE — Discharge Summary (Signed)
Physician Discharge Summary  Bradley Rice:[id number] DOB: [birth year] DOA: 12/29/2022  PCP: [name], FNP  Admit date: 12/29/2022 Discharge date: 01/03/2023  Admitted From: Home Disposition:  Home  Discharge Condition:Stable CODE STATUS:FULL Diet recommendation:  Regular   Brief/Interim Summary: Patient is a 62 year old male with history of advanced small cell lung cancer, recent pneumothorax requiring chest tube from 5/24 to 5/28, ILD, chronic hypoxic respite failure on 6 days of oxygen who presented to [facility] emergency department with complaint of shortness of breath.  On condition he was found to have absent lung sounds on the left, imaging consistent with recurrence of left pneumothorax.  Status post chest tube placement.  Transferred to [facility].  PCCM consulted and managing the chest tube.he underwent bronchoscopy 01/02/2023, placement of endobronchial valves.  PCCM removed the chest tube on 6/4, cleared for discharge.  Plan for follow-up with pulmonology as an outpatient , repeat bronchoscopy and removal of endobronchial valve as an outpatient    Following problems were addressed during the hospitalization:  Recurrent left-sided pneumothorax:   He was recently admitted at [facility] for pneumothorax, was on chest tube from 5/24 to 5/28.  Status post chest tube placement.  He underwent bronchoscopy 01/02/2023, placement of endobronchial valves.  PCCM removed the chest tube on 6/4, cleared for discharge.  Plan for follow-up with pulmonology as an outpatient , repeat bronchoscopy and removal of endobronchial valve as an outpatient. Follow-up chest x-ray done today showed stable changes   Chronic hypoxic respiratory failure: On 6 L of oxygen at baseline.  Has history of COPD, ILD.   Right lung small cell lung cancer: Imagings  show stable right lower lobe lung mass, extensive mediastinal/hilar metastatic nodal disease.  Currently on chemotherapy.  Follows with Dr. Ellin Saba    Interstitial lung disease: PCCM following.   Chronic pain syndrome: On OxyContin, breakthrough oxycodone   Anxiety: Continue Home BuSpar and duloxetine   Sinus tachycardia: Patient says it is a  chronic problem. Recent TSH is normal.  On metoprolol  Discharge Diagnoses:  Principal Problem:   Pneumothorax Active Problems:   Endobronchial cancer, right (HCC)   Recurrent pneumothorax after chest tube removed   Pressure injury of skin    Discharge Instructions  Discharge Instructions     Diet general   Complete by: As directed    Discharge instructions   Complete by: As directed    1)Please follow up with your PCP in a week 2)Follow up with pulmonology as an outpatient 3)Take your medications as instructed   Increase activity slowly   Complete by: As directed    No wound care   Complete by: As directed       Allergies as of 01/03/2023       Reactions   Pirfenidone Nausea And Vomiting, Other (See Comments)   Weight loss        Medication List     TAKE these medications    albuterol (2.5 MG/3ML) 0.083% nebulizer solution Commonly known as: PROVENTIL Take 3 mLs (2.5 mg total) by nebulization every 6 (six) hours as needed for wheezing or shortness of breath.   albuterol 108 (90 Base) MCG/ACT inhaler Commonly known as: VENTOLIN HFA Inhale 2 puffs into the lungs every 6 (six) hours as needed for wheezing or shortness of breath.   benzonatate 200 MG capsule Commonly known as: TESSALON Take 1 capsule (200 mg total) by mouth 3 (three) times daily as needed. What changed: reasons to take this   blood  glucose meter kit and supplies Dispense based on patient and insurance preference. Use up to four times daily as directed. (FOR ICD-10 E10.9, E11.9).   busPIRone 5 MG tablet Commonly known as: BUSPAR Take 1 tablet (5 mg total) by mouth 3 (three) times daily.   CARBOPLATIN IV Inject into the vein every 21 ( twenty-one) days.   DULoxetine 60 MG capsule Commonly  known as: Cymbalta Take 1 capsule (60 mg total) by mouth daily.   DURVALUMAB IV Inject into the vein every 21 ( twenty-one) days.   Ensure Max Protein Liqd Take 330 mLs by mouth 3 (three) times daily.   ETOPOSIDE IV Inject into the vein every 21 ( twenty-one) days.   famotidine 20 MG tablet Commonly known as: PEPCID Take 1 tablet (20 mg total) by mouth 2 (two) times daily.   fexofenadine 180 MG tablet Commonly known as: ALLEGRA Take 1 tablet (180 mg total) by mouth daily.   ibuprofen 800 MG tablet Commonly known as: ADVIL Take 1 tablet (800 mg total) by mouth every 8 (eight) hours as needed.   lidocaine-prilocaine cream Commonly known as: EMLA Apply a small amount to port a cath site and cover with plastic wrap 1 hour prior to infusion appointments   magic mouthwash w/lidocaine Soln Take 10 mLs by mouth 3 (three) times daily as needed for mouth pain.   megestrol 400 MG/10ML suspension Commonly known as: MEGACE Take 10 mLs (400 mg total) by mouth 2 (two) times daily.   metoprolol tartrate 25 MG tablet Commonly known as: LOPRESSOR Take 0.5 tablets (12.5 mg total) by mouth 2 (two) times daily. What changed:  medication strength how much to take   Misc. Devices Misc Patient require 6L Nasal Cannula Oxygen   naloxone 4 MG/0.1ML Liqd nasal spray kit Commonly known as: NARCAN Place 1 spray into the nose as needed (opioid overdose).   nortriptyline 50 MG capsule Commonly known as: PAMELOR Take 50 mg by mouth 2 (two) times daily.   Ofev 150 MG Caps Generic drug: Nintedanib Take 1 capsule (150 mg total) by mouth 2 (two) times daily.   oxyCODONE 20 mg 12 hr tablet Commonly known as: OXYCONTIN Take 1 tablet (20 mg total) by mouth every 12 (twelve) hours.   oxyCODONE 15 MG immediate release tablet Commonly known as: ROXICODONE Take 1 tablet (15 mg total) by mouth 4 (four) times daily.   prochlorperazine 10 MG tablet Commonly known as: COMPAZINE Take 1 tablet (10  mg total) by mouth every 6 (six) hours as needed for nausea or vomiting.   traZODone 50 MG tablet Commonly known as: DESYREL Take 2 tablets (100 mg total) by mouth at bedtime. What changed:  when to take this reasons to take this        Follow-up Information     [name], FNP. Schedule an appointment as soon as possible for a visit in 1 week(s).   Specialty: Family Medicine Contact information: [address] [place] [address] [phone]                Allergies  Allergen Reactions   Pirfenidone Nausea And Vomiting and Other (See Comments)    Weight loss    Consultations: PCCM   Procedures/Studies: DG CHEST PORT 1 VIEW  Result Date: 01/03/2023 CLINICAL DATA:  Follow-up EXAM: PORTABLE CHEST 1 VIEW COMPARISON:  01/03/2023, 5:52 a.m. FINDINGS: Interval removal of a left-sided pigtail chest tube. No significant change in a small, no greater than 10% left apical  pneumothorax. Unchanged dense, masslike consolidation of the right lung base. Background of mild, diffuse bilateral interstitial pulmonary opacity and emphysema. Heart and mediastinum are normal in size. Left chest port catheter. Osseous structures unremarkable. IMPRESSION: 1. Interval removal of a left-sided pigtail chest tube. No significant change in a small, no greater than 10% left apical pneumothorax. 2. Unchanged dense, masslike consolidation of the right lung base. 3. Background of mild, diffuse bilateral interstitial pulmonary opacity and emphysema. Electronically Signed   By: Jearld Lesch M.D.   On: 01/03/2023 15:01   DG CHEST PORT 1 VIEW  Result Date: 01/03/2023 CLINICAL DATA:  Left chest tube.  Pneumothorax. EXAM: PORTABLE CHEST 1 VIEW COMPARISON:  Chest radiographs 01/02/2023 (multiple studies), 01/01/2023, 12/31/2022 FINDINGS: Left-sided chest tube position appears unchanged. Small left apical pneumothorax appears and SMA changed, measuring up to approximately 2 cm in craniocaudal  dimension. Cardiac silhouette is again mildly enlarged. Mediastinal contours are unchanged and within normal limits. Left chest wall porta catheter tip overlies the superior right atrium, unchanged. Chronic interstitial thickening and reticular opacities with high-grade peripheral paraseptal emphysematous changes and peripheral honeycombing as seen on prior CT. Right lower lung density corresponding to the known right hilar and lower lobe primary bronchogenic malignancy. Lower cervical spine discectomy fusion hardware. IMPRESSION: 1. Unchanged small left apical pneumothorax with left chest tube in place. 2. Chronic interstitial thickening and reticular opacities with high-grade paraseptal emphysematous changes and peripheral honeycombing as seen on prior CT. Electronically Signed   By: Neita Garnet M.D.   On: 01/03/2023 10:24   DG Chest Port 1 View  Result Date: 01/02/2023 CLINICAL DATA:  Left pneumothorax EXAM: PORTABLE CHEST 1 VIEW COMPARISON:  Previous studies including the examination done earlier today FINDINGS: There is small left apical pneumothorax with no significant change. There is left chest tube with its tip in the mid lung field. Transverse diameter of heart is increased. Increased interstitial markings are seen in both lungs, mostly in right lower lung field. There is blunting of right lateral CP angle. There is no pneumothorax on the right side. Tip of left IJ chest port is seen at the junction of superior vena cava and right atrium. IMPRESSION: There is small left apical pneumothorax with no significant interval change. Left chest tube is noted in place. Increased interstitial markings in both lungs may suggest interstitial lung disease with scarring. Possibility of superimposed interstitial edema or interstitial pneumonia is not excluded. Small right pleural effusion. Electronically Signed   By: Ernie Avena M.D.   On: 01/02/2023 13:13   DG CHEST PORT 1 VIEW  Result Date:  01/02/2023 CLINICAL DATA:  [id number] Pneumothorax [id number] EXAM: PORTABLE CHEST 1 VIEW COMPARISON:  CXR 01/01/23 FINDINGS: Left-sided pleural pigtail drainage catheter in place with unchanged positioning. Left-sided needle access chest port in place with tip near the cavoatrial junction. There is persistent small left apical pneumothorax, unchanged from prior exam. Redemonstrated extensive bilateral interstitial opacities, favored to represent changes related to patient's underlying fibrotic lung disease. Unchanged cardiac and mediastinal contours. Unchanged nodular opacity at the right lung base in the right perihilar region. IMPRESSION: Unchanged small left apical pneumothorax with left-sided pleural pigtail drainage catheter in place. Electronically Signed   By: Lorenza Cambridge M.D.   On: 01/02/2023 07:57   DG Chest Port 1 View  Result Date: 01/01/2023 CLINICAL DATA:  62 year old male with a history of small cell lung cancer on chemotherapy. Recent pneumothorax, hospitalization. Pneumothorax recurrence, chest tube in place. EXAM: PORTABLE CHEST 1 VIEW COMPARISON:  Portable chest 12/31/2022 and earlier. FINDINGS: Portable AP upright views at 0742 hours. Stable pigtail left chest tube. Residual small left apical pneumothorax, volume decreased compared to yesterday. Pleural edge no longer visible at the costophrenic angle. Otherwise stable ventilation. Abnormal right mediastinal contours. Left chest Port-A-Cath. Diffuse coarse pulmonary interstitial opacity. Visualized tracheal air column is within normal limits. Stable cholecystectomy clips. Negative visible bowel gas. IMPRESSION: 1. Stable left chest tube with decreased pneumothorax from yesterday. Small residual in the apex. 2. Otherwise stable ventilation. Abnormal lungs and right mediastinal contours, see details on Chest CT 12/29/2022. Electronically Signed   By: Odessa Fleming M.D.   On: 01/01/2023 09:36   DG Chest Port 1 View  Result Date: 12/31/2022 CLINICAL DATA:   Chest tube surveillance EXAM: PORTABLE CHEST 1 VIEW COMPARISON:  12/30/2022 FINDINGS: Enlarging left pneumothorax (estimated at approximately 25%). Left sided pigtail pleural drainage catheter is position within the left mid lung. No abnormal shift of the heart or mediastinum. Left chest port remains in place. Known right lower lobe mass. Background of interstitial lung disease. No right-sided pneumothorax. IMPRESSION: Enlarging left pneumothorax (estimated at approximately 25%). Left sided chest tube remains in place. These results will be called to the ordering clinician or representative by the Radiologist Assistant, and communication documented in the PACS or [employer]. Electronically Signed   By: Duanne Guess D.O.   On: 12/31/2022 09:33   DG Chest Port 1 View  Result Date: 12/30/2022 CLINICAL DATA:  Chest tube. EXAM: PORTABLE CHEST 1 VIEW COMPARISON:  12/29/2022. FINDINGS: 0601 hours. Unchanged left chest Port-A-Cath with tip projecting over the superior cavoatrial junction. Unchanged left-sided pleural drainage catheter. Small residual left apical pneumothorax. Unchanged right lower lobe mass and background of interstitial lung disease. Stable cardiac and mediastinal contours. No pleural effusion. IMPRESSION: 1. Unchanged left-sided pleural drainage catheter with small residual left apical pneumothorax. 2. Unchanged right lower lobe mass and background of interstitial lung disease. Electronically Signed   By: Orvan Falconer M.D.   On: 12/30/2022 08:55   CT CHEST WO CONTRAST  Result Date: 12/29/2022 CLINICAL DATA:  Pneumothorax EXAM: CT CHEST WITHOUT CONTRAST TECHNIQUE: Multidetector CT imaging of the chest was performed following the standard protocol without IV contrast. RADIATION DOSE REDUCTION: This exam was performed according to the departmental dose-optimization program which includes automated exposure control, adjustment of the mA and/or kV according to patient size and/or use of  iterative reconstruction technique. COMPARISON:  11/24/2022 FINDINGS: Cardiovascular: Moderate multi-vessel coronary artery calcification. Global cardiac size within normal limits. No pericardial effusion. Central pulmonary arteries are enlarged in keeping with pulmonary arterial hypertension. Mild atherosclerotic calcification within the thoracic aorta. No aortic aneurysm. Left internal jugular chest port tip seen within the a right atrium. Mediastinum/Nodes: Visualized thyroid is unremarkable. Confluent, pathologic right paratracheal, right pre-vascular, right hilar, and subcarinal adenopathy is again identified, grossly unchanged from prior examination in keeping with metastatic nodal disease. Lungs/Pleura: Anterolateral left pigtail chest tube has been placed. Tiny left anteroapical pneumothorax is present. 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 Multilobulated mass within the right lower lobe in keeping with a primary bronchogenic neoplasm in a background of underlying interstitial lung disease again noted, grossly unchanged from prior examination. No pleural effusion. No central obstructing lesion. Upper Abdomen: No acute abnormality. Musculoskeletal: No acute bone abnormality. No lytic or blastic bone lesion. IMPRESSION: 1. Interval placement of a left anterolateral pigtail chest tube. Tiny residual left anteroapical pneumothorax. 2. Stable right lower lobe mass  in keeping with a primary bronchogenic neoplasm in a background of underlying interstitial lung disease. Stable extensive mediastinal and right hilar metastatic nodal disease. 3. UIP pattern fibrotic interstitial lung disease. 4. Moderate multi-vessel coronary artery calcification. 5. Morphologic changes in keeping with pulmonary arterial hypertension. Aortic Atherosclerosis (ICD10-I70.0). Electronically Signed   By: Helyn Numbers M.D.   On: 12/29/2022 23:15   DG Chest Portable 1 View  Result Date:  12/29/2022 CLINICAL DATA:  chest tube EXAM: PORTABLE CHEST 1 VIEW COMPARISON:  CXR 12/27/22 FINDINGS: Left-sided thoracostomy tube in place without a radiographically discernible pneumothorax. No pleural effusion. Unchanged cardiac and mediastinal contours. Left-sided needle accessed port in place with the tip near the cavoatrial junction. Unchanged nodular opacity at the right lung base. Unchanged extensive bilateral interstitial opacities, favored to represent sequela fibrotic lung disease. No radiographically apparent displaced rib fractures. IMPRESSION: Left-sided thoracostomy tube in place without a radiographically discernible pneumothorax. Electronically Signed   By: Lorenza Cambridge M.D.   On: 12/29/2022 13:19   DG Chest 2 View  Result Date: 12/29/2022 CLINICAL DATA:  Worsening shortness of breath, concern for recurrent pneumothorax. EXAM: CHEST - 2 VIEW COMPARISON:  Two days ago FINDINGS: Large left pneumothorax, over 50% with left diaphragm depression. A left chest tube has been removed. Known right lower lobe mass. Extensive interstitial opacity from pulmonary fibrosis and emphysema. Left-sided porta catheter with tip at the upper right atrium. Normal heart size. There is mediastinal adenopathy less well appreciated when compared to recent CT. Critical Value/emergent results were called by telephone at the time of interpretation on 12/29/2022 at 12:31 pm to provider Dr. Estell Harpin, who verbally acknowledged these results. IMPRESSION: Large left pneumothorax with diaphragm depression. Electronically Signed   By: Tiburcio Pea M.D.   On: 12/29/2022 12:31   DG Chest 1 View  Result Date: 12/27/2022 CLINICAL DATA:  Follow-up pneumothorax EXAM: PORTABLE CHEST 1 VIEW COMPARISON:  Film from earlier in the same day. FINDINGS: Cardiac shadow is stable. Left chest wall port is again seen. Slight increase in the left-sided pneumothorax is noted. Chest tube remains in place. Persistent airspace opacities are again  noted bilaterally slightly worse on the right than the left. Bony structures appear within normal limits. IMPRESSION: Slight increase in the left-sided pneumothorax when compared with the prior exam. The remainder of the exam is stable from the prior study. Electronically Signed   By: Alcide Clever M.D.   On: 12/27/2022 15:54   DG CHEST PORT 1 VIEW  Result Date: 12/27/2022 CLINICAL DATA:  Left pneumothorax EXAM: PORTABLE CHEST 1 VIEW COMPARISON:  Previous studies including the examination of 12/26/2022 FINDINGS: There is a interval decrease in size of left pneumothorax with small residual left pneumothorax in the periphery of left upper lung field. Left chest tube is noted in place. Transverse diameter of heart is increased. Increased interstitial markings are seen in both lungs. There are patchy densities in right mid and right lower lung fields. Right hilum is more prominent than left. There is blunting of lateral CP angles. Tip of left IJ chest port is seen at the junction of superior vena cava and right atrium. IMPRESSION: There is interval decrease in size of pneumothorax in the periphery of left upper lung field. Electronically Signed   By: Ernie Avena M.D.   On: 12/27/2022 09:16   DG CHEST PORT 1 VIEW  Result Date: 12/26/2022 CLINICAL DATA:  [id number] Chest tube in place [id number] EXAM: PORTABLE CHEST - 1 VIEW COMPARISON:  Earlier film of  the same day FINDINGS: Stable left pigtail chest tube directed towards the hilum. Small scratch the apical and lateral a pneumothorax slightly more conspicuous, the lung apex projecting below the posterior aspect left third rib. Stable right IJ port catheter to the mid right atrium. Masslike opacity at the right lung base stable. Diffuse interstitial lung disease stable. Heart size normal. Aortic Atherosclerosis (ICD10-170.0). Right hilar and paratracheal fullness suggesting adenopathy. No effusion. Visualized bones unremarkable. IMPRESSION: 1. Small left apical  and lateral pneumothorax, with chest tube in place. 2. Stable right lower lobe mass, and hilar adenopathy. Electronically Signed   By: Corlis Leak M.D.   On: 12/26/2022 20:33   DG CHEST PORT 1 VIEW  Result Date: 12/26/2022 CLINICAL DATA:  Chest tube in place. EXAM: PORTABLE CHEST 1 VIEW COMPARISON:  Radiograph yesterday FINDINGS: Decreased size of left pneumothorax, lateral component no longer visualized. Small residual at the apex. Left pigtail catheter remains in place. Left chest port remains in place. Otherwise unchanged appearance of the chest with chronic lung disease and right-sided thoracic malignancy. IMPRESSION: Decreased size of left pneumothorax, small residual. Left pigtail catheter remains in place. Electronically Signed   By: Narda Rutherford M.D.   On: 12/26/2022 13:00   DG CHEST PORT 1 VIEW  Result Date: 12/25/2022 CLINICAL DATA:  Chest tube clamped. EXAM: PORTABLE CHEST 1 VIEW COMPARISON:  Earlier today. FINDINGS: Increased size of left pneumothorax, small to moderate, with increasing lateral component. For example lateral component measures 11 mm at the level of the port, previously 4 mm. Pigtail catheter in the left mid lung again seen. The exam is otherwise unchanged IMPRESSION: Increased size of left pneumothorax, small to moderate, with increasing lateral component. Pigtail catheter remains in place. Electronically Signed   By: Narda Rutherford M.D.   On: 12/25/2022 20:49   DG CHEST PORT 1 VIEW  Result Date: 12/25/2022 CLINICAL DATA:  Chest tube in place, on water seal. EXAM: PORTABLE CHEST 1 VIEW COMPARISON:  Earlier today FINDINGS: Slight increased size of left pneumothorax with chest tube on water seal, still small, however now visualized laterally. Pigtail catheter in place unchanged in positioning. Left chest port remains in place. Chronic lung disease and known right lung malignancy are unchanged. IMPRESSION: Slight increased size of left pneumothorax with chest tube on water  seal, still small, however now visualized laterally. Left pigtail catheter in place. Electronically Signed   By: Narda Rutherford M.D.   On: 12/25/2022 20:47   DG CHEST PORT 1 VIEW  Result Date: 12/25/2022 CLINICAL DATA:  Chest tube in place, no pneumothorax. EXAM: PORTABLE CHEST 1 VIEW COMPARISON:  Most recent radiograph yesterday. FINDINGS: Unchanged small left apical pneumothorax from yesterday. Left-sided pigtail catheter remains in place. Left chest port in place. The exam is otherwise unchanged with sequela of right lung malignancy and chronic lung disease. IMPRESSION: 1. Unchanged small left apical pneumothorax with left chest tube in place. 2. Unchanged right lung malignancy and chronic lung disease. Electronically Signed   By: Narda Rutherford M.D.   On: 12/25/2022 10:31   DG Chest Port 1 View  Result Date: 12/24/2022 CLINICAL DATA:  Pneumothorax. EXAM: PORTABLE CHEST 1 VIEW COMPARISON:  Radiograph yesterday FINDINGS: Left-sided pigtail catheter remains in place. Stable size of small left apical pneumothorax. Left chest port remains in place. Known right lung malignancy and chronic lung disease are otherwise unchanged. IMPRESSION: 1. Unchanged small left apical pneumothorax. Left chest tube remains in place. 2. Unchanged right lung malignancy and chronic lung disease.  Electronically Signed   By: Narda Rutherford M.D.   On: 12/24/2022 11:16   DG Chest Port 1 View  Result Date: 12/23/2022 CLINICAL DATA:  Shortness of breath. EXAM: PORTABLE CHEST 1 VIEW COMPARISON:  Chest x-ray Dec 23, 2022. FINDINGS: Left chest tube in place with decreased size of the left pneumothorax, now small. Similar patchy interstitial airspace opacities. No new masses better characterized on recent CT chest. No mediastinal shift. Otherwise, cardiomediastinal silhouette is similar. Left subclavian approach Port-A-Cath with tip in the right atrium. IMPRESSION: Left chest tube in place with decreased size of the left  pneumothorax, now small. Electronically Signed   By: Feliberto Harts M.D.   On: 12/23/2022 11:41   DG Chest Port 1 View  Result Date: 12/23/2022 CLINICAL DATA:  sob EXAM: PORTABLE CHEST 1 VIEW COMPARISON:  November 10, 2022. FINDINGS: Large left pneumothorax. Some rightward mediastinal shift. Otherwise, cardiomediastinal silhouette is similar. Left subclavian approach cardiac rhythm maintenance device with the tip projecting at the right atrium. Patchy interstitial and airspace opacities throughout the lungs. Known masses better characterized on recent CT chest. IMPRESSION: 1. Large left pneumothorax. Some rightward mediastinal shift raises concern for tension physiology component. 2. Known masses better characterized on recent CT chest. 3. Additional patchy interstitial and airspace opacities throughout the lungs could represent changes of emphysema but superimposed infection or aspiration is difficult to exclude. Findings discussed with Dr. Charm Barges via telephone at 10:26 AM. Electronically Signed   By: Feliberto Harts M.D.   On: 12/23/2022 10:28   MR Brain W Wo Contrast  Result Date: 12/16/2022 CLINICAL DATA:  Non-small cell lung cancer, staging a EXAM: MRI HEAD WITHOUT AND WITH CONTRAST TECHNIQUE: Multiplanar, multiecho pulse sequences of the brain and surrounding structures were obtained without and with intravenous contrast. CONTRAST:  6mL GADAVIST GADOBUTROL 1 MMOL/ML IV SOLN COMPARISON:  04/13/2017 FINDINGS: Evaluation is somewhat limited by motion artifact. Brain: Linear area of restricted diffusion in the posterior right cerebellum, which measures up to 18 x 6 x 8 mm (AP x TR x CC). Additional smaller areas of restricted diffusion with ADC correlates are noted in the bilateral cerebellar hemispheres (series 5, images 64 and 66) and left paramedian pons (series 5, image 64), which are T2 hyperintense and nonenhancing, concerning for acute infarcts. Additional punctate foci of increased diffusivity  without definite ADC correlate in the right posterior frontal white matter (series 5, image 88), the left occipital lobe (series 5, image 80), and right caudate tail (series 5, image 81), concerning for subacute infarcts. Diffusion restricting focus with ADC correlate in the anteromedial right frontal cortex (series 5, image 81) correlates with possible contrast enhancement (series 19, image 26 and series 18, image 32), which could represent metastatic disease. No significant associated T2 hyperintense signal. No other abnormal parenchymal enhancement. No acute hemorrhage, mass, mass effect, or midline shift. No hydrocephalus or extra-axial collection. Normal pituitary and craniocervical junction. No hemosiderin deposition to suggest remote hemorrhage. Normal cerebral volume for age. Scattered T2 hyperintense signal in the periventricular white matter, likely the sequela of mild-to-moderate chronic small vessel ischemic disease. Vascular: Normal arterial flow voids. Normal arterial and venous enhancement. Skull and upper cervical spine: Normal marrow signal. Sinuses/Orbits: Air-fluid level in the right maxillary sinus. Mucosal thickening in the left maxillary sinus. No acute finding in the orbits. Other: The mastoid air cells are well aerated. IMPRESSION: 1. Evaluation is somewhat limited by motion artifact. 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 2. Additional focus of diffusion restriction in the medial left frontal cortex which correlates with possible contrast enhancement, although this is not well defined, in part due to motion. This is concerning for a focus of metastatic disease but could also represent an acute infarct. Attention on follow-up. 3. Additional likely subacute infarcts in the right posterior frontal lobe, left occipital lobe, and right caudate tail. 4. Air-fluid level in the right maxillary sinus, which is nonspecific but  can be seen in the setting of acute sinusitis. Electronically Signed   By: Wiliam Ke M.D.   On: 12/16/2022 14:40   NM PET Image Initial (PI) Skull Base To Thigh  Result Date: 12/16/2022 CLINICAL DATA:  Initial treatment strategy for non-small cell lung cancer. EXAM: NUCLEAR MEDICINE PET SKULL BASE TO THIGH TECHNIQUE: 6.8 mCi F-18 FDG was injected intravenously. Full-ring PET imaging was performed from the skull base to thigh after the radiotracer. CT data was obtained and used for attenuation correction and anatomic localization. Fasting blood glucose: 111 mg/dl COMPARISON:  CT chest 08/03/7251. FINDINGS: Mediastinal blood pool activity: SUV max 1.7 Liver activity: SUV max NA NECK: Hypermetabolic posterior left level 2 lymph node measures 8 mm (3/33), SUV max 2.1. No additional abnormal hypermetabolism. Incidental CT findings: None. CHEST: Extensive hypermetabolic adenopathy in the low internal jugular, supraclavicular, mediastinal and right hilar stations. Index low right paratracheal nodal mass measures approximately 3.7 cm (3/93), SUV max 6.5. Masslike consolidation in the right lower lobe is somewhat difficult to measure but collectively, measures approximately 8.9 x 9.1 cm (7/74), with SUV max measured superiorly, 8.2. Incidental CT findings: Left IJ Port-A-Cath terminates in the right atrium. Atherosclerotic calcification of the aorta and coronary arteries. Heart is at the upper limits of normal in size. Trace pericardial effusion. Small loculated right pleural effusion. Paraseptal emphysema. Suspect basilar honeycombing. ABDOMEN/PELVIS: Vague activity in the periportal region without a definite CT correlate. High left periaortic lymph nodes measure up to approximately 7 mm (3/155), SUV max 2.7. Small nodules posterior to the right kidney measure up to 10 mm (3/186) do not show abnormal hypermetabolism. No additional abnormal hypermetabolism. Incidental CT findings: Liver, adrenal glands, kidneys,  spleen, pancreas, stomach and bowel are grossly unremarkable. Cholecystectomy. Prostate is slightly enlarged. SKELETON: No abnormal hypermetabolism. Incidental CT findings: Degenerative changes in the spine. Postoperative changes in the lumbar spine. IMPRESSION: 1. 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 2. Small nodules posterior to the right kidney do not show abnormal hypermetabolism. Recommend attention on follow-up. 3. Trace pericardial effusion. 4. Small loculated right pleural effusion. 5. 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 6. Mildly enlarged prostate. 7. Aortic atherosclerosis (ICD10-I70.0). Coronary artery calcification. Electronically Signed   By: Leanna Battles M.D.   On: 12/16/2022 14:39   IR IMAGING GUIDED PORT INSERTION  Result Date: 12/14/2022 INDICATION: SCLC EXAM: IMPLANTED PORT A CATH PLACEMENT WITH ULTRASOUND AND FLUOROSCOPIC GUIDANCE MEDICATIONS: None ANESTHESIA/SEDATION: Moderate (conscious) sedation was employed during this procedure. A total of Versed 2 mg and Fentanyl 100 mcg was administered intravenously. Moderate Sedation Time: 33 minutes. The patient's level of consciousness and vital signs were monitored continuously by radiology nursing throughout the procedure under my direct supervision. FLUOROSCOPY TIME:  Fluoroscopic dose; 1 mGy COMPLICATIONS: None immediate. PROCEDURE: The procedure, risks, benefits, and alternatives were explained to the patient. Questions regarding the procedure were encouraged and answered. The patient understands and  consents to the procedure. The LEFT neck and chest were prepped with chlorhexidine in a sterile fashion, and a sterile drape was applied covering the operative field. Maximum barrier sterile technique with sterile gowns and gloves were used for the procedure. A timeout  was performed prior to the initiation of the procedure. Local anesthesia was provided with 1% lidocaine with epinephrine. After creating a small venotomy incision, a micropuncture kit was utilized to access the internal jugular vein under direct, real-time ultrasound guidance. Ultrasound image documentation was performed. The microwire was kinked to measure appropriate catheter length. A subcutaneous port pocket was then created along the upper chest wall utilizing a combination of sharp and blunt dissection. The pocket was irrigated with sterile saline. A single lumen ISP power injectable port was chosen for placement. The 8 Fr catheter was tunneled from the port pocket site to the venotomy incision. The port was placed in the pocket. The external catheter was trimmed to appropriate length. At the venotomy, an 8 Fr peel-away sheath was placed over a guidewire under fluoroscopic guidance. The catheter was then placed through the sheath and the sheath was removed. Final catheter positioning was confirmed and documented with a fluoroscopic spot radiograph. The port was accessed with a Huber needle, aspirated and flushed with heparinized saline. The port pocket incision was closed with interrupted 3-0 Vicryl suture then Dermabond was applied, including at the venotomy incision. Dressings were placed. The patient tolerated the procedure well without immediate post procedural complication. IMPRESSION: Successful placement of a LEFT internal jugular approach power injectable Port-A-Cath. The tip of the catheter is positioned within the proximal RIGHT atrium. The catheter is ready for immediate use. Roanna Banning, MD [facility]iology Electronically Signed   By: Roanna Banning M.D.   On: 12/14/2022 11:06   [place] CORE BIOPSY (LYMPH NODES)  Result Date: 12/12/2022 INDICATION: R Lung Mass with R Supraclavicular Lymph Node EXAM: ULTRASOUND-GUIDED RIGHT SUPRACLAVICULAR LYMPH  NODE BIOPSY COMPARISON:  CT chest, 11/24/2022. MEDICATIONS: 5 mL lidocaine 1% ANESTHESIA/SEDATION: Local anesthetic was administered. COMPLICATIONS: None immediate. TECHNIQUE: Informed written consent was obtained from the patient and/or patient's representative after a discussion of the risks, benefits and alternatives to treatment. Questions regarding the procedure were encouraged and answered. Initial ultrasound scanning demonstrated enlarged RIGHT supraclavicular lymph mass. An ultrasound image was saved for documentation purposes. The procedure was planned. A timeout was performed prior to the initiation of the procedure. The operative was prepped and draped in the usual sterile fashion, and a sterile drape was applied covering the operative field. A timeout was performed prior to the initiation of the procedure. Local anesthesia was provided with 1% lidocaine. Under direct ultrasound guidance, an 18 gauge core needle device was utilized to obtain to obtain 4 core needle biopsies of the RIGHT supraclavicular lymph node. The samples were placed in saline and submitted to pathology. The needle was removed and superficial hemostasis was achieved with manual compression. Post procedure scan was negative for significant hematoma. A dressing was applied. The patient tolerated the procedure well without immediate postprocedural complication. IMPRESSION: Successful ultrasound guided biopsy of the enlarged RIGHT supraclavicular lymph node. Roanna Banning, MD [facility] Radiology Electronically Signed   By: Roanna Banning M.D.   On: 12/12/2022 14:46      Subjective: Patient seen and examined at bedside today.  Hemodynamically stable.  Very eager to go home today.  Denies shortness of breath, cough  Discharge Exam: Vitals:   01/03/23  4098 01/03/23 1150  BP: 99/69 114/71  Pulse: (!) 115   Resp: 14 20  Temp: 98 F (36.7 C) 97.7 F (36.5 C)  SpO2: 98% 97%    Vitals:   01/03/23 0445 01/03/23 0500 01/03/23 0839 01/03/23 1150  BP: 93/66  99/69 114/71  Pulse: (!) 102  (!) 115   Resp: 15  14 20   Temp: 97.9 F (36.6 C)  98 F (36.7 C) 97.7 F (36.5 C)  TempSrc: Oral  Oral Oral  SpO2: 94%  98% 97%  Weight:  127 kg    Height:        General: Pt is alert, awake, not in acute distress Cardiovascular: RRR, S1/S2 +, no rubs, no gallops Respiratory: CTA bilaterally, no wheezing, no rhonchi Abdominal: Soft, NT, ND, bowel sounds + Extremities: no edema, no cyanosis    The results of significant diagnostics from this hospitalization (including imaging, microbiology, ancillary and laboratory) are listed below for reference.     Microbiology: Recent Results (from the past 240 hour(s))  SARS Coronavirus 2 by RT PCR (hospital order, performed in [facility] hospital lab) *cepheid single result test* Anterior Nasal Swab     Status: None   Collection Time: 01/02/23  8:06 AM   Specimen: Anterior Nasal Swab  Result Value Ref Range Status   SARS Coronavirus 2 by RT PCR NEGATIVE NEGATIVE Final    Comment: Performed at [facility], [address] [place], [place] [address]  Surgical pcr screen     Status: None   Collection Time: 01/02/23  8:06 AM   Specimen: Nasal Mucosa; Nasal Swab  Result Value Ref Range Status   MRSA, PCR NEGATIVE NEGATIVE Final   Staphylococcus aureus NEGATIVE NEGATIVE Final    Comment: (NOTE) The Xpert SA Assay (FDA approved for NASAL specimens in patients 83 years of age and older), is one component of a comprehensive surveillance program. It is not intended to diagnose infection nor to guide or monitor treatment. Performed at [facility], [address] [place], [place] [address]      Labs: BNP (last 3 results) Recent Labs    12/23/22 1021  BNP 39.0   Basic Metabolic Panel: Recent Labs  Lab 12/29/22 0931 12/29/22 1845 12/30/22 1122 01/02/23 0140  NA 134*  --  133* 136  K 4.0  --  4.0 3.9   CL 97*  --  96* 99  CO2 27  --  30 27  GLUCOSE 122*  --  120* 124*  BUN 12  --  13 11  CREATININE 0.57* 0.56* 0.64 0.74  CALCIUM 9.4  --  9.1 9.0  MG 1.9  --  2.0  --   PHOS  --   --  3.6  --    Liver Function Tests: Recent Labs  Lab 12/29/22 0931  AST 21  ALT 45*  ALKPHOS 63  BILITOT 0.6  PROT 8.1  ALBUMIN 3.6   No results for input(s): "LIPASE", "AMYLASE" in the last 168 hours. No results for input(s): "AMMONIA" in the last 168 hours. CBC: Recent Labs  Lab 12/29/22 0931 12/29/22 1845 12/30/22 1122 01/02/23 0140  WBC 7.1 5.4 4.8 5.1  NEUTROABS 5.7  --   --   --   HGB 12.5* 11.5* 11.7* 10.7*  HCT 38.2* 35.7* 36.4* 33.5*  MCV 89.9 89.3 87.3 87.2  PLT 184 164 160 175   Cardiac Enzymes: No results for input(s): "CKTOTAL", "CKMB", "CKMBINDEX", "TROPONINI" in the last 168 hours.  BNP: Invalid input(s): "POCBNP" CBG: No results for input(s): "GLUCAP" in the last 168 hours. D-Dimer No results for input(s): "DDIMER" in the last 72 hours. Hgb A1c No results for input(s): "HGBA1C" in the last 72 hours. Lipid Profile No results for input(s): "CHOL", "HDL", "LDLCALC", "TRIG", "CHOLHDL", "LDLDIRECT" in the last 72 hours. Thyroid function studies No results for input(s): "TSH", "T4TOTAL", "T3FREE", "THYROIDAB" in the last 72 hours.  Invalid input(s): "FREET3" Anemia work up No results for input(s): "VITAMINB12", "FOLATE", "FERRITIN", "TIBC", "IRON", "RETICCTPCT" in the last 72 hours. Urinalysis    Component Value Date/Time   COLORURINE STRAW (A) 03/18/2021 1119   APPEARANCEUR CLEAR 03/18/2021 1119   APPEARANCEUR Clear 06/13/2017 1545   LABSPEC 1.003 (L) 03/18/2021 1119   PHURINE 7.0 03/18/2021 1119   GLUCOSEU NEGATIVE 03/18/2021 1119   HGBUR NEGATIVE 03/18/2021 1119   BILIRUBINUR NEGATIVE 03/18/2021 1119   BILIRUBINUR Negative 06/13/2017 1545   KETONESUR NEGATIVE 03/18/2021 1119   PROTEINUR NEGATIVE 03/18/2021 1119   UROBILINOGEN 1.0 12/28/2013 1703   NITRITE  NEGATIVE 03/18/2021 1119   LEUKOCYTESUR NEGATIVE 03/18/2021 1119   Sepsis Labs Recent Labs  Lab 12/29/22 0931 12/29/22 1845 12/30/22 1122 01/02/23 0140  WBC 7.1 5.4 4.8 5.1   Microbiology Recent Results (from the past 240 hour(s))  SARS Coronavirus 2 by RT PCR (hospital order, performed in [facility] hospital lab) *cepheid single result test* Anterior Nasal Swab     Status: None   Collection Time: 01/02/23  8:06 AM   Specimen: Anterior Nasal Swab  Result Value Ref Range Status   SARS Coronavirus 2 by RT PCR NEGATIVE NEGATIVE Final    Comment: Performed at [facility], [address], [place], [place] [address]  Surgical pcr screen     Status: None   Collection Time: 01/02/23  8:06 AM   Specimen: Nasal Mucosa; Nasal Swab  Result Value Ref Range Status   MRSA, PCR NEGATIVE NEGATIVE Final   Staphylococcus aureus NEGATIVE NEGATIVE Final    Comment: (NOTE) The Xpert SA Assay (FDA approved for NASAL specimens in patients 84 years of age and older), is one component of a comprehensive surveillance program. It is not intended to diagnose infection nor to guide or monitor treatment. Performed at [facility], [address], [place], [place] [address]     Please note: You were cared for by a hospitalist during your hospital stay. Once you are discharged, your primary care physician will handle any further medical issues. Please note that NO REFILLS for any discharge medications will be authorized once you are discharged, as it is imperative that you return to your primary care physician (or establish a relationship with a primary care physician if you do not have one) for your post hospital discharge needs so that they can reassess your need for medications and monitor your lab values.    Time coordinating discharge: 40 minutes  SIGNED:   Burnadette Pop, MD  [facility] 01/03/2023, 3:04 PM Pager [phone]  If 7PM-7AM, please contact  night-coverage [email or web address] Password [email or web address]

## 2023-01-03 NOTE — Telephone Encounter (Signed)
The front staff is following a process of seeing a different provider. I scheduled patient with BI on 7/18 as a new patient and I am assuming we are cancelling the the pulmonary function test and follow up with Ramaswamy on 02/13/2023.

## 2023-01-04 ENCOUNTER — Telehealth: Payer: Self-pay

## 2023-01-04 ENCOUNTER — Encounter (HOSPITAL_COMMUNITY): Payer: Self-pay | Admitting: Pulmonary Disease

## 2023-01-04 LAB — CYTOLOGY - NON PAP

## 2023-01-04 NOTE — Patient Outreach (Signed)
The patient was given information about care management services as a benefit of their Medicaid health plan today.    Patient                                              agreed to services and verbal consent obtained.   BSW will send patient resources for food rent and utilities and schedule with RNCM.  Abelino Derrick, MHA [facility]  [facility] Social Worker [phone]

## 2023-01-04 NOTE — Transitions of Care (Post Inpatient/ED Visit) (Signed)
01/04/2023  Name: Bradley Rice MRN: [id number] DOB: Sep 03, 1960  Today's TOC FU Call Status: Today's TOC FU Call Status:: Successful TOC FU Call Competed TOC FU Call Complete Date: 01/04/23  Transition Care Management Follow-up Telephone Call Date of Discharge: 01/03/23 Discharge Facility: [facility]) Type of Discharge: Inpatient Admission Primary Inpatient Discharge Diagnosis:: Pneumothorax How have you been since you were released from the hospital?: Better Any questions or concerns?: No  Items Reviewed: Did you receive and understand the discharge instructions provided?: Yes Medications obtained,verified, and reconciled?: Yes (Medications Reviewed) Any new allergies since your discharge?: No Dietary orders reviewed?: No Do you have support at home?: Yes People in Home: significant other  Medications Reviewed Today: Medications Reviewed Today     Reviewed by Bryson Dames, RN (Registered Nurse) on 01/02/23 at 1151  Med List Status: Complete   Medication Order Taking? Sig Documenting Provider Last Dose Status Informant  albuterol (PROVENTIL) (2.5 MG/3ML) 0.083% nebulizer solution [id number] Yes Take 3 mLs (2.5 mg total) by nebulization every 6 (six) hours as needed for wheezing or shortness of breath. Glenford Bayley, NP 12/29/2022 Active Self  albuterol (VENTOLIN HFA) 108 (90 Base) MCG/ACT inhaler [id number] Yes Inhale 2 puffs into the lungs every 6 (six) hours as needed for wheezing or shortness of breath. Glenford Bayley, NP 12/29/2022 Active Self  benzonatate (TESSALON) 200 MG capsule [id number] Yes Take 1 capsule (200 mg total) by mouth 3 (three) times daily as needed.  Patient taking differently: Take 200 mg by mouth 3 (three) times daily as needed for cough.   [name], [place] 12/29/2022 Active Self  blood glucose meter kit and supplies [id number]  Dispense based on patient and insurance preference. Use up to four times daily as directed. (FOR ICD-10 E10.9, E11.9).  [name], FNP  Active Self  busPIRone (BUSPAR) 5 MG tablet [id number] Yes Take 1 tablet (5 mg total) by mouth 3 (three) times daily. [name], [place] 12/29/2022 Active Self  CARBOPLATIN IV [id number] Yes Inject into the vein every 21 ( twenty-one) days. [provider] 12/29/2022 Active Self           Med Note (SATTERFIELD, Vito Berger Dec 29, 2022  8:35 PM) Patient not sure of dose, he states he did take today  DULoxetine (CYMBALTA) 60 MG capsule [id number]  Take 1 capsule (60 mg total) by mouth daily. [name], FNP  Active Self  DURVALUMAB IV [id number] Yes Inject into the vein every 21 ( twenty-one) days. [provider] 12/29/2022 Active Self           Med Note (SATTERFIELD, Vito Berger Dec 29, 2022  8:35 PM) Patient not sure of dose. He states he did take today   Ensure Max Protein (ENSURE MAX PROTEIN) LIQD [id number]  Take 330 mLs by mouth 3 (three) times daily. [name], FNP  Active Self  ETOPOSIDE IV [id number] Yes Inject into the vein every 21 ( twenty-one) days. [provider] 12/29/2022 Active Self           Med Note (SATTERFIELD, Vito Berger Dec 29, 2022  8:35 PM) Patient not sure of dose. He states he did take today   famotidine (PEPCID) 20 MG tablet [id number] Yes Take 1 tablet (20 mg total) by mouth 2 (two) times daily. Doreatha Massed, MD 12/29/2022 Active Self  fexofenadine (ALLEGRA) 180 MG tablet [id number] Yes Take 1 tablet (180 mg total)  by mouth daily. [name], [place] 12/29/2022 Active Self  ibuprofen (ADVIL) 800 MG tablet [id number] Yes Take 1 tablet (800 mg total) by mouth every 8 (eight) hours as needed. [name], FNP Past Week Active Self  lidocaine-prilocaine (EMLA) cream [id number] Yes Apply a small amount to port a cath site and cover with plastic wrap 1 hour prior to infusion appointments Doreatha Massed, MD 12/29/2022 Active Self  magic mouthwash w/lidocaine SOLN [id number] Yes Take 10 mLs by mouth  3 (three) times daily as needed for mouth pain. [name], FNP Past Week Active Self           Med Note (SATTERFIELD, Vito Berger Dec 29, 2022  8:38 PM)    megestrol (MEGACE) 400 MG/10ML suspension [id number] Yes Take 10 mLs (400 mg total) by mouth 2 (two) times daily. Doreatha Massed, MD 12/29/2022 Active Self  metoprolol tartrate (LOPRESSOR) 50 MG tablet [id number] Yes Take 0.5 tablets (25 mg total) by mouth 2 (two) times daily. Cleora Fleet, MD 12/29/2022 0500 Active Self  Misc. Devices MISC [id number]  Patient require 6L Nasal Cannula Oxygen Doreatha Massed, MD  Active Self  naloxone [facility]) nasal spray 4 mg/0.1 mL [id number] Yes Place 1 spray into the nose as needed (opioid overdose). [provider] unknown Active Self  Nintedanib (OFEV) 150 MG CAPS [id number] Yes Take 1 capsule (150 mg total) by mouth 2 (two) times daily. Glenford Bayley, NP 12/29/2022 Active Self  nortriptyline (PAMELOR) 50 MG capsule [id number] Yes Take 50 mg by mouth 2 (two) times daily. [provider] 12/29/2022 Active Self  oxyCODONE (OXYCONTIN) 20 mg 12 hr tablet [id number] Yes Take 1 tablet (20 mg total) by mouth every 12 (twelve) hours. Doreatha Massed, MD 12/29/2022 Active Self  oxyCODONE (ROXICODONE) 15 MG immediate release tablet [id number] Yes Take 15 mg by mouth 4 (four) times daily. [provider] 12/29/2022 Active Self           Med Note (SATTERFIELD, Vito Berger Dec 29, 2022  8:40 PM) Patient states he is also taking the Oxycontin 20 mg every 12 hours per patient per patient   prochlorperazine (COMPAZINE) 10 MG tablet [id number] Yes Take 1 tablet (10 mg total) by mouth every 6 (six) hours as needed for nausea or vomiting. Doreatha Massed, MD 12/29/2022 Active Self  traZODone (DESYREL) 50 MG tablet [id number] Yes Take 2 tablets (100 mg total) by mouth at bedtime.  Patient taking differently: Take 100 mg by mouth at bedtime as needed for sleep.   Cleora Fleet, MD 12/28/2022 Active Self            Home Care and Equipment/Supplies: Were Home Health Services Ordered?: No Any new equipment or medical supplies ordered?: No  Functional Questionnaire: Do you need assistance with bathing/showering or dressing?: No Do you need assistance with meal preparation?: No Do you need assistance with eating?: No Do you have difficulty maintaining continence: No Do you need assistance with getting out of bed/getting out of a chair/moving?: No Do you have difficulty managing or taking your medications?: No  Follow up appointments reviewed: PCP Follow-up appointment confirmed?: Yes Date of PCP follow-up appointment?: 01/09/23 Follow-up Provider: [facility] Follow-up appointment confirmed?: No Do you need transportation to your follow-up appointment?: No  SDOH Interventions    Flowsheet Row Office Visit from 09/01/2022 in [facility] Office Visit from 02/10/2022 in [facility] Video Visit  from 12/18/2020 in [facility]  SDOH Interventions     Depression Interventions/Treatment  Currently on Treatment Currently on Treatment Currently on Treatment, Patient refuses Treatment         Gus Puma, BSW, [facility]  Managed [facility] Social Worker [phone]

## 2023-01-04 NOTE — Progress Notes (Signed)
Dr. Kirtland Bouchard, I just put valves in him for persistent ptx to try to stop it. I hope it will prevent recurrent admissions. He is seeing you soon.   Thanks,  BLI  Josephine Igo, DO [facility] 01/04/2023 3:04 PM

## 2023-01-04 NOTE — Patient Instructions (Signed)
Visit Information  Bradley Rice was given information about Medicaid Managed Care team care coordination services as a part of their Healthy [facility] Medicaid benefit. [name] verbally consented to engagement with the [facility] Managed Care team.   If you are experiencing a medical emergency, please call 911 or report to your local emergency department or urgent care.   If you have a non-emergency medical problem during routine business hours, please contact your provider's office and ask to speak with a nurse.   For questions related to your Healthy [facility] health plan, please call: [phone] or visit the homepage here: [email or web address]  If you would like to schedule transportation through your Healthy [facility] plan, please call the following number at least 2 days in advance of your appointment: [phone]  For information about your ride after you set it up, call Ride Assist at [phone]. Use this number to activate a Will Call pickup, or if your transportation is late for a scheduled pickup. Use this number, too, if you need to make a change or cancel a previously scheduled reservation.  If you need transportation services right away, call [phone]. The after-hours call center is staffed 24 hours to handle ride assistance and urgent reservation requests (including discharges) 365 days a year. Urgent trips include sick visits, hospital discharge requests and life-sustaining treatment.  Call the [facility] Line at [phone], at any time, 24 hours a day, 7 days a week. If you are in danger or need immediate medical attention call 911.  If you would like help to quit smoking, call 1-800-QUIT-NOW (404-455-8331) OR Espaol: 1-855-Djelo-Ya (4-742-595-6387) o para ms informacin haga clic aqu or Text READY to 564-332 to register via text  [name] - following are the goals we discussed in your visit today:   Goals  Addressed   None     Social Worker will follow up in 30 days.  Gus Puma, Kenard Gower, MHA [facility]  Managed Medicaid Social Worker [phone]   Following is a copy of your plan of care:  There are no care plans that you recently modified to display for this patient.

## 2023-01-05 ENCOUNTER — Inpatient Hospital Stay: Payer: 59 | Attending: Physician Assistant | Admitting: Licensed Clinical Social Worker

## 2023-01-05 DIAGNOSIS — C3491 Malignant neoplasm of unspecified part of right bronchus or lung: Secondary | ICD-10-CM

## 2023-01-05 DIAGNOSIS — Z5111 Encounter for antineoplastic chemotherapy: Secondary | ICD-10-CM | POA: Insufficient documentation

## 2023-01-05 DIAGNOSIS — Z5112 Encounter for antineoplastic immunotherapy: Secondary | ICD-10-CM | POA: Insufficient documentation

## 2023-01-05 DIAGNOSIS — C3431 Malignant neoplasm of lower lobe, right bronchus or lung: Secondary | ICD-10-CM | POA: Insufficient documentation

## 2023-01-05 DIAGNOSIS — C770 Secondary and unspecified malignant neoplasm of lymph nodes of head, face and neck: Secondary | ICD-10-CM | POA: Insufficient documentation

## 2023-01-05 DIAGNOSIS — Z87891 Personal history of nicotine dependence: Secondary | ICD-10-CM | POA: Insufficient documentation

## 2023-01-05 NOTE — Progress Notes (Signed)
[facility] Clinical Social Work  Initial Assessment   Bradley Rice is a 62 y.o. year old male contacted caregiver by phone. Clinical Social Work was referred by medical provider for assessment of psychosocial needs.   SDOH (Social Determinants of Health) assessments performed: Yes SDOH Interventions    Flowsheet Row Office Visit from 09/01/2022 in [facility] Office Visit from 02/10/2022 in [facility] Video Visit from 12/18/2020 in [facility]  SDOH Interventions     Depression Interventions/Treatment  Currently on Treatment Currently on Treatment Currently on Treatment, Patient refuses Treatment       SDOH Screenings   Food Insecurity: No Food Insecurity (01/04/2023)  Housing: Low Risk  (01/04/2023)  Transportation Needs: No Transportation Needs (12/23/2022)  Utilities: Not At Risk (01/04/2023)  Depression (PHQ2-9): Low Risk  (11/18/2022)  Recent Concern: Depression (PHQ2-9) - Medium Risk (09/01/2022)  Financial Resource Strain: Low Risk  (11/18/2022)  Physical Activity: Unknown (11/18/2022)  Social Connections: Unknown (11/18/2022)  Stress: Stress Concern Present (11/18/2022)  Tobacco Use: Medium Risk (01/04/2023)     Distress Screen completed: No     No data to display            Family/Social Information:  Housing Arrangement: patient lives with his wife, Lowry Bowl.  Information for assessment gathered from pt's wife. Family members/support persons in your life? Pt has 2 sons residing close by as well as his sister-in-law her are available to provide support as needed.   Transportation concerns: no  Employment: Unemployed per wife pt has not worked in a couple of years since becoming dependent on home oxygen.  Pt's wife is employed, but has not been working since pt was hospitalized.  Pt wife states she is entitled to 480 hrs of FMLA, but does not receive any income for the hours.  At  present they do not have an income for the home and are reliant on family. Income source: No income Financial concerns: Yes, current concerns Type of concern: Utilities and Rent/ mortgage Food access concerns: yes Religious or spiritual practice: Yes-Christian Services Currently in place:  none  Coping/ Adjustment to diagnosis: Patient understands treatment plan and what happens next? yes Concerns about diagnosis and/or treatment: Overwhelmed by information, Afraid of cancer, How will I care for myself, and Quality of life Patient reported stressors: ` Hopes and/or priorities: pt's priority is to start treatment w/ the hope of positive results. Patient enjoys  not addressed Current coping skills/ strengths: Motivation for treatment/growth  and Supportive family/friends     SUMMARY: Current SDOH Barriers:  Financial constraints related to no income and ADL IADL limitations  Clinical Social Work Clinical Goal(s):  Explore community resource options for unmet needs related to:  Financial Strain   Interventions: Discussed common feeling and emotions when being diagnosed with cancer, and the importance of support during treatment Informed patient of the support team roles and support services at [facility] Provided CSW contact information and encouraged patient to call with any questions or concerns Referred patient to Marijean Niemann for financial resources.  Informed of the [employer] and process to apply for additional financial support.  Pt reportedly has not ever applied for SSI.  CSW encouraged wife to speak w/ pt's CM through Medicaid to explore this option for a source of income.  Pt may also want to explore long term disability benefits.  Pt's wife given contact information for the [employer] to acquire needed  DME.     Follow Up Plan: Patient will contact CSW with any support or resource needs Patient verbalizes understanding of plan: Yes    Rachel Moulds, LCSW Clinical Social  Worker [facility]

## 2023-01-06 ENCOUNTER — Telehealth: Payer: Self-pay | Admitting: Family

## 2023-01-06 ENCOUNTER — Telehealth: Payer: Self-pay | Admitting: Acute Care

## 2023-01-06 DIAGNOSIS — J849 Interstitial pulmonary disease, unspecified: Secondary | ICD-10-CM

## 2023-01-06 DIAGNOSIS — J95811 Postprocedural pneumothorax: Secondary | ICD-10-CM

## 2023-01-06 DIAGNOSIS — C3491 Malignant neoplasm of unspecified part of right bronchus or lung: Secondary | ICD-10-CM

## 2023-01-06 NOTE — Telephone Encounter (Signed)
Called and spoke with patient, advised him of need to f/u with PCP regarding pain management until he sees his pain management doctor.  He stated that he has a f/u with his PCP next week.  He verbalized understanding.  Order sent to [facility] for the humidification.  Nothing further needed.

## 2023-01-06 NOTE — Telephone Encounter (Signed)
Called and spoke with patient, he states that his pain medication was not sent to [facility] drug after he was discharged from the hospital and he had a procedure while he was in the hospital.  I asked him if he was seeing pain management before he went in the hospital and he said yes, but he cannot go back until the following month and he needs pain medication.  He also says an order for a humidity with his oxygen was supposed to be sent to [facility] to keep him from coughing like he had in the hospital.  I sent an Epic chat to Anders Simmonds NP to see how to proceed with this patient's needs.  He has a scheduled f/u as a new patient on 02/16/23 with Dr. Tonia Brooms.  Will await response from Anders Simmonds NP.

## 2023-01-06 NOTE — Telephone Encounter (Signed)
Pts spouse called to schedule pt a HFU for collapsed lung. Unable to come in next Monday, Tuesday, or Wednesday because he has Chemo and can't come on Thursday because he has a pain management appt. Wants appt with PCP and also is requesting that PCP send in refills on his pain medication that hospital prescribed him.

## 2023-01-06 NOTE — Telephone Encounter (Signed)
Appt made patient wife aware

## 2023-01-06 NOTE — Telephone Encounter (Signed)
Received a message back from Anders Simmonds NP regarding pain meds and humidification.  He is not going to manage his pain medication, he will need to contact his PCP office for that, however, our office will send in orders to [facility] for humidification, patient is on 6L of oxygen.

## 2023-01-08 NOTE — Progress Notes (Signed)
[facility] [address], [place] [address]    Clinic Day:  01/08/2023  Referring physician: [name], FNP  Patient Care Team: [name], FNP as PCP - General (Family Medicine) Merryl Hacker, NP as Nurse Practitioner (Nurse Practitioner) Doreatha Massed, MD as Medical Oncologist (Medical Oncology) Therese Sarah, RN as Oncology Nurse Navigator (Medical Oncology)   ASSESSMENT & PLAN:   Assessment: 1.  Extensive stage small cell lung cancer: - 20 to 30 pound weight loss in the last 4 months due to decreased appetite. - Pain in the chest bilaterally since end of April with hemoptysis. - CT chest (11/22/2022): Multiple right lower lobe lung masses, bulky right hilar, mediastinal, right greater than left supraclavicular lymph nodes.  Fibrotic interstitial lung disease. - Right supraclavicular lymph node biopsy (12/12/2022): Small cell carcinoma. - PET scan (12/15/2022): Right lower lobe bronchogenic carcinoma with lymphadenopathy in the chest, left neck, upper abdomen. - Brain MRI (12/16/2022): Acute infarcts in the bilateral cerebellar hemispheres and left paramedian pons.  Additional focus of diffusion restriction in the medial left frontal cortex, not well-defined.  Question focus of metastatic disease versus acute infarct. - Cycle 1 of carboplatin, etoposide and durvalumab on 12/19/2022   2.  Social/family history: - Lives at home with his wife.  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  Quit smoking 2 years ago.  Smoked 1 and half pack per day for 30 years. - He has been on oxygen at nighttime for 2 years and has been on continuous oxygen at 6 L/min since recent hospitalization in April. - He was adopted and does not know family history.    Plan: 1.  Extensive stage small cell lung cancer: - We have reviewed pathology reports of the right supraclavicular node biopsy. - We have reviewed scan images of the PET scan  and MRI. - We discussed the prognosis of small cell lung cancer. - We discussed chemoimmunotherapy with carboplatin, etoposide and durvalumab.  We discussed side effects in detail. - I reviewed labs: Normal LFTs with low albumin.  Creatinine normal.  CBC grossly normal. - He will proceed with cycle 1 day 1 today.  He will be evaluated in the symptom management clinic next week.  RTC 3 weeks for follow-up.   2.  Bilateral rib pains and back pain: - He is currently taking oxycodone 15 mg every 6 hours as needed which is not helping. - Will start him on OxyContin 20 mg twice daily.  Continue oxycodone 15 mg as needed and hold for drowsiness.  Use stool softeners for constipation.   3.  Decreased appetite: - Will start him on Megace 400 mg twice daily.    No orders of the defined types were placed in this encounter.     I,Katie Daubenspeck,acting as a Neurosurgeon for Doreatha Massed, MD.,have documented all relevant documentation on the behalf of Doreatha Massed, MD,as directed by  Doreatha Massed, MD while in the presence of Doreatha Massed, MD.   ***  Katie Daubenspeck   6/9/20249:27 PM  CHIEF COMPLAINT:   Diagnosis: locally advanced lung cancer   Cancer Staging  Endobronchial cancer, right [facility]) Staging form: Lung, AJCC 8th Edition - Clinical stage from 12/07/2022: Stage IVA (cT3, cN3, cM1b) - Unsigned    Prior Therapy: none  Current Therapy:  durvalumab + carboplatin + etoposide    HISTORY OF PRESENT ILLNESS:   Oncology History  Endobronchial cancer, right (HCC)  12/07/2022 Initial Diagnosis  Small cell lung cancer, right (HCC)   12/19/2022 -  Chemotherapy   Patient is on Treatment Plan : LUNG SMALL CELL EXTENSIVE STAGE Durvalumab + Carboplatin D1 + Etoposide D1-3 q21d x 4 Cycles / Durvalumab q28d        INTERVAL HISTORY:   Bradley Rice is a 62 y.o. male presenting to clinic today for follow up of locally advanced lung cancer. He was last seen by me on 12/19/22.  He was also seen by PA Rebekah in our symptom management clinic on 12/29/22.  Since his last visit, he was hospitalized twice for spontaneous left pneumothorax-- 12/23/22 - 12/27/22 and 12/29/22 - 01/03/23. During his second admission, he underwent insertion of left intrabronchial valve on 01/02/23 under Dr. Tonia Brooms. During the procedure, biopsies were obtained from the right lung and showed small cell undifferentiated carcinoma within the right mainstream bronchus.  Today, he states that he is doing well overall. His appetite level is at ***%. His energy level is at ***%.  PAST MEDICAL HISTORY:   Past Medical History: Past Medical History:  Diagnosis Date   ALS (amyotrophic lateral sclerosis) (HCC)    Asthma    Back pain    COPD (chronic obstructive pulmonary disease) (HCC)    Coronary artery calcification seen on CT scan    DDD (degenerative disc disease), lumbar    Depression    Dysphagia    Following previous surgery   History of blood transfusion 2014   Post-op bleed   History of lung cancer    History of stroke 2015   Idiopathic pulmonary fibrosis (HCC)    Migraine    Port-A-Cath in place 12/15/2022   Spider bite 2012    Surgical History: Past Surgical History:  Procedure Laterality Date   ANTERIOR CERVICAL DECOMP/DISCECTOMY FUSION  12/22/2011   Procedure: ANTERIOR CERVICAL DECOMPRESSION/DISCECTOMY FUSION 3 LEVELS;  Surgeon: Venita Lick, MD;  Location: [facility] OR;  Service: Orthopedics;  Laterality: N/A;  ACDF C5-T1   ANTERIOR CERVICAL DECOMP/DISCECTOMY FUSION  06/25/2012   Procedure: ANTERIOR CERVICAL DECOMPRESSION/DISCECTOMY FUSION 1 LEVEL/HARDWARE REMOVAL;  Surgeon: Kerrin Champagne, MD;  Location: [facility] OR;  Service: Orthopedics;  Laterality: N/A;  Removal anterior cervical plate Z6-X0, Explore Fusion, Left C5-6, C6-7, C7-T1 Re-do Foraminotomy, Left open carpal tunnel release with release of ulnar nerve at [facility], Posterior Cervical Fusion with lateral mass screw   ANTERIOR CERVICAL  DECOMP/DISCECTOMY FUSION N/A 03/25/2021   Procedure: ANTERIOR CERVICAL DECOMPRESSION/DISCECTOMY FUSION 2 LEVELS  C3-5;  Surgeon: Venita Lick, MD;  Location: [facility] OR;  Service: Orthopedics;  Laterality: N/A;  ANTERIOR CERVICAL DECOMPRESSION/DISCECTOMY FUSION 2 LEVELS  C3-5   BACK SURGERY     BRONCHIAL BIOPSY  01/02/2023   Procedure: BRONCHIAL BIOPSIES;  Surgeon: Josephine Igo, DO;  Location: [facility] ENDOS[facility];  Service: Pulmonary;;   BRONCHIAL WASHINGS  01/02/2023   Procedure: BRONCHIAL WASHINGS;  Surgeon: Josephine Igo, DO;  Location: [facility];  Service: Pulmonary;;   CARDIAC CATHETERIZATION     CARPAL TUNNEL RELEASE  06/25/2012   Procedure: CARPAL TUNNEL RELEASE;  Surgeon: Kerrin Champagne, MD;  Location: [facility] OR;  Service: Orthopedics;  Laterality: Left;  as above   CHOLECYSTECTOMY  2009   CORONARY ANGIOPLASTY  2001      INSERTION OF IBV VALVE Left 01/02/2023   Procedure: INSERTION OF INTERBRONCHIAL VALVE (IBV);  Surgeon: Josephine Igo, DO;  Location: [facility];  Service: Pulmonary;  Laterality: Left;   IR IMAGING GUIDED PORT INSERTION  12/14/2022   LUMBAR FUSION  2000   POSTERIOR CERVICAL FUSION/FORAMINOTOMY  06/25/2012   Procedure: POSTERIOR CERVICAL FUSION/FORAMINOTOMY LEVEL 1;  Surgeon: Kerrin Champagne, MD;  Location: [facility];  Service: Orthopedics;  Laterality: N/A;  as above   POSTERIOR CERVICAL FUSION/FORAMINOTOMY N/A 12/17/2013   Procedure: REMOVAL OF POSTERIOR CERVICAL FUSION LATERAL MASS SCREWS AND RODS C5-T1, EXPLORE FUSION, LEFT SIX-SEVEN FORAMINOTOMY;  Surgeon: Kerrin Champagne, MD;  Location: [facility] OR;  Service: Orthopedics;  Laterality: N/A;   VASECTOMY  1990   VIDEO BRONCHOSCOPY  01/02/2023   Procedure: VIDEO BRONCHOSCOPY WITHOUT FLUORO;  Surgeon: Josephine Igo, DO;  Location: [facility];  Service: Pulmonary;;    Social History: Social History   Socioeconomic History   Marital status: Married    Spouse name: [name]   Number of children: 2   Years of education: 10th    Highest education level: 12th grade  Occupational History   Occupation: Disability    Employer: [employer]  Tobacco Use   Smoking status: Former    Packs/day: 1.00    Years: 30.00    Additional pack years: 0.00    Total pack years: 30.00    Types: Cigarettes    Quit date: 10/2015    Years since quitting: 7.1    Passive exposure: Current   Smokeless tobacco: Never   Tobacco comments:    Patient denies current smoking.  Patient lives with wife who is a smoker. Updated 11/24/2022. am  Vaping Use   Vaping Use: Never used  Substance and Sexual Activity   Alcohol use: No    Alcohol/week: 0.0 standard drinks of alcohol   Drug use: No   Sexual activity: Yes  Other Topics Concern   Not on file  Social History Narrative   Patient lives at home with his spouse.   Caffeine Use: coffee   Social Determinants of Health   Financial Resource Strain: High Risk (01/05/2023)   Overall Financial Resource Strain (CARDIA)    Difficulty of Paying Living Expenses: Very hard  Food Insecurity: No Food Insecurity (01/04/2023)   Hunger Vital Sign    Worried About Running Out of Food in the Last Year: Never true    Ran Out of Food in the Last Year: Never true  Transportation Needs: No Transportation Needs (12/23/2022)   PRAPARE - Administrator, Civil Service (Medical): No    Lack of Transportation (Non-Medical): No  Physical Activity: Unknown (11/18/2022)   Exercise Vital Sign    Days of Exercise per Week: Patient declined    Minutes of Exercise per Session: Not on file  Stress: Stress Concern Present (11/18/2022)   [employer] of Occupational Health - Occupational Stress Questionnaire    Feeling of Stress : Very much  Social Connections: Unknown (11/18/2022)   Social Connection and Isolation Panel [NHANES]    Frequency of Communication with Friends and Family: More than three times a week    Frequency of Social Gatherings with Friends and Family: Three times a week    Attends  Religious Services: Patient declined    Active Member of Clubs or Organizations: No    Attends Banker Meetings: Not on file    Marital Status: Married  Intimate Partner Violence: Not At Risk (01/04/2023)   Humiliation, Afraid, Rape, and Kick questionnaire    Fear of Current or Ex-Partner: No    Emotionally Abused: No    Physically Abused: No    Sexually Abused: No    Family History: Family History  Problem Relation  Age of Onset   ALS Mother    Cancer Mother    Cancer Sister    ALS Sister    Cancer Brother        1 brother   Anesthesia problems Neg Hx     Current Medications:  Current Outpatient Medications:    albuterol (PROVENTIL) (2.5 MG/3ML) 0.083% nebulizer solution, Take 3 mLs (2.5 mg total) by nebulization every 6 (six) hours as needed for wheezing or shortness of breath., Disp: 150 mL, Rfl: 1   albuterol (VENTOLIN HFA) 108 (90 Base) MCG/ACT inhaler, Inhale 2 puffs into the lungs every 6 (six) hours as needed for wheezing or shortness of breath., Disp: 1 each, Rfl: 5   benzonatate (TESSALON) 200 MG capsule, Take 1 capsule (200 mg total) by mouth 3 (three) times daily as needed. (Patient taking differently: Take 200 mg by mouth 3 (three) times daily as needed for cough.), Disp: 90 capsule, Rfl: 2   blood glucose meter kit and supplies, Dispense based on patient and insurance preference. Use up to four times daily as directed. (FOR ICD-10 E10.9, E11.9)., Disp: 1 each, Rfl: 0   busPIRone (BUSPAR) 5 MG tablet, Take 1 tablet (5 mg total) by mouth 3 (three) times daily., Disp: 90 tablet, Rfl: 1   CARBOPLATIN IV, Inject into the vein every 21 ( twenty-one) days., Disp: , Rfl:    DULoxetine (CYMBALTA) 60 MG capsule, Take 1 capsule (60 mg total) by mouth daily., Disp: 90 capsule, Rfl: 1   DURVALUMAB IV, Inject into the vein every 21 ( twenty-one) days., Disp: , Rfl:    Ensure Max Protein (ENSURE MAX PROTEIN) LIQD, Take 330 mLs by mouth 3 (three) times daily., Disp: 29700  mL, Rfl: 2   ETOPOSIDE IV, Inject into the vein every 21 ( twenty-one) days., Disp: , Rfl:    famotidine (PEPCID) 20 MG tablet, Take 1 tablet (20 mg total) by mouth 2 (two) times daily., Disp: 60 tablet, Rfl: 3   fexofenadine (ALLEGRA) 180 MG tablet, Take 1 tablet (180 mg total) by mouth daily., Disp: 90 tablet, Rfl: 1   ibuprofen (ADVIL) 800 MG tablet, Take 1 tablet (800 mg total) by mouth every 8 (eight) hours as needed., Disp: 30 tablet, Rfl: 0   lidocaine-prilocaine (EMLA) cream, Apply a small amount to port a cath site and cover with plastic wrap 1 hour prior to infusion appointments, Disp: 30 g, Rfl: 3   magic mouthwash w/lidocaine SOLN, Take 10 mLs by mouth 3 (three) times daily as needed for mouth pain., Disp: 250 mL, Rfl: 1   megestrol (MEGACE) 400 MG/10ML suspension, Take 10 mLs (400 mg total) by mouth 2 (two) times daily., Disp: 480 mL, Rfl: 2   metoprolol tartrate (LOPRESSOR) 25 MG tablet, Take 0.5 tablets (12.5 mg total) by mouth 2 (two) times daily., Disp: 30 tablet, Rfl: 0   Misc. Devices MISC, Patient require 6L Nasal Cannula Oxygen, Disp: 99 each, Rfl: 99   naloxone (NARCAN) nasal spray 4 mg/0.1 mL, Place 1 spray into the nose as needed (opioid overdose)., Disp: , Rfl:    Nintedanib (OFEV) 150 MG CAPS, Take 1 capsule (150 mg total) by mouth 2 (two) times daily., Disp: 180 capsule, Rfl: 1   nortriptyline (PAMELOR) 50 MG capsule, Take 50 mg by mouth 2 (two) times daily., Disp: , Rfl:    oxyCODONE (OXYCONTIN) 20 mg 12 hr tablet, Take 1 tablet (20 mg total) by mouth every 12 (twelve) hours., Disp: 60 tablet, Rfl: 0  oxyCODONE (ROXICODONE) 15 MG immediate release tablet, Take 1 tablet (15 mg total) by mouth 4 (four) times daily., Disp: 15 tablet, Rfl: 0   prochlorperazine (COMPAZINE) 10 MG tablet, Take 1 tablet (10 mg total) by mouth every 6 (six) hours as needed for nausea or vomiting., Disp: 60 tablet, Rfl: 4   traZODone (DESYREL) 50 MG tablet, Take 2 tablets (100 mg total) by mouth at  bedtime. (Patient taking differently: Take 100 mg by mouth at bedtime as needed for sleep.), Disp: , Rfl:    Allergies: Allergies  Allergen Reactions   Pirfenidone Nausea And Vomiting and Other (See Comments)    Weight loss    REVIEW OF SYSTEMS:   Review of Systems  Constitutional:  Negative for chills, fatigue and fever.  HENT:   Negative for lump/mass, mouth sores, nosebleeds, sore throat and trouble swallowing.   Eyes:  Negative for eye problems.  Respiratory:  Negative for cough and shortness of breath.   Cardiovascular:  Negative for chest pain, leg swelling and palpitations.  Gastrointestinal:  Negative for abdominal pain, constipation, diarrhea, nausea and vomiting.  Genitourinary:  Negative for bladder incontinence, difficulty urinating, dysuria, frequency, hematuria and nocturia.   Musculoskeletal:  Negative for arthralgias, back pain, flank pain, myalgias and neck pain.  Skin:  Negative for itching and rash.  Neurological:  Negative for dizziness, headaches and numbness.  Hematological:  Does not bruise/bleed easily.  Psychiatric/Behavioral:  Negative for depression, sleep disturbance and suicidal ideas. The patient is not nervous/anxious.   All other systems reviewed and are negative.    VITALS:   There were no vitals taken for this visit.  Wt Readings from Last 3 Encounters:  01/03/23 279 lb 15.8 oz (127 kg)  12/29/22 125 lb 7.1 oz (56.9 kg)  12/27/22 131 lb 1.6 oz (59.5 kg)    There is no height or weight on file to calculate BMI.  Performance status (ECOG): {CHL ONC [id number]  PHYSICAL EXAM:   Physical Exam Vitals and nursing note reviewed. Exam conducted with a chaperone present.  Constitutional:      Appearance: Normal appearance.  Cardiovascular:     Rate and Rhythm: Normal rate and regular rhythm.     Pulses: Normal pulses.     Heart sounds: Normal heart sounds.  Pulmonary:     Effort: Pulmonary effort is normal.     Breath sounds: Normal  breath sounds.  Abdominal:     Palpations: Abdomen is soft. There is no hepatomegaly, splenomegaly or mass.     Tenderness: There is no abdominal tenderness.  Musculoskeletal:     Right lower leg: No edema.     Left lower leg: No edema.  Lymphadenopathy:     Cervical: No cervical adenopathy.     Right cervical: No superficial, deep or posterior cervical adenopathy.    Left cervical: No superficial, deep or posterior cervical adenopathy.     Upper Body:     Right upper body: No supraclavicular or axillary adenopathy.     Left upper body: No supraclavicular or axillary adenopathy.  Neurological:     General: No focal deficit present.     Mental Status: He is alert and oriented to person, place, and time.  Psychiatric:        Mood and Affect: Mood normal.        Behavior: Behavior normal.     LABS:      Latest Ref Rng & Units 01/02/2023    1:40 AM 12/30/2022   11:22  AM 12/29/2022    6:45 PM  CBC  WBC 4.0 - 10.5 K/uL 5.1  4.8  5.4   Hemoglobin 13.0 - 17.0 g/dL 16.1  09.6  04.5   Hematocrit 39.0 - 52.0 % 33.5  36.4  35.7   Platelets 150 - 400 K/uL 175  160  164       Latest Ref Rng & Units 01/02/2023    1:40 AM 12/30/2022   11:22 AM 12/29/2022    6:45 PM  CMP  Glucose 70 - 99 mg/dL 409  811    BUN 6 - 20 mg/dL 11  13    Creatinine 9.14 - 1.24 mg/dL 7.82  9.56  2.13   Sodium 135 - 145 mmol/L 136  133    Potassium 3.5 - 5.1 mmol/L 3.9  4.0    Chloride 98 - 111 mmol/L 99  96    CO2 22 - 32 mmol/L 27  30    Calcium 8.9 - 10.3 mg/dL 9.0  9.1       No results found for: "CEA1", "CEA" / No results found for: "CEA1", "CEA" Lab Results  Component Value Date   PSA1 2.9 09/01/2022   No results found for: "YQM578" No results found for: "CAN125"  No results found for: "TOTALPROTELP", "ALBUMINELP", "A1GS", "A2GS", "BETS", "BETA2SER", "GAMS", "MSPIKE", "SPEI" Lab Results  Component Value Date   TIBC 248 (L) 02/10/2022   TIBC 232 (L) 12/11/2017   TIBC 275 03/17/2017   FERRITIN  207 02/10/2022   FERRITIN 174 12/11/2017   FERRITIN 125 03/17/2017   IRONPCTSAT 17 02/10/2022   IRONPCTSAT 20 12/11/2017   IRONPCTSAT 15 03/17/2017   No results found for: "LDH"   STUDIES:   DG CHEST PORT 1 VIEW  Result Date: 01/03/2023 CLINICAL DATA:  Follow-up EXAM: PORTABLE CHEST 1 VIEW COMPARISON:  01/03/2023, 5:52 a.m. FINDINGS: Interval removal of a left-sided pigtail chest tube. No significant change in a small, no greater than 10% left apical pneumothorax. Unchanged dense, masslike consolidation of the right lung base. Background of mild, diffuse bilateral interstitial pulmonary opacity and emphysema. Heart and mediastinum are normal in size. Left chest port catheter. Osseous structures unremarkable. IMPRESSION: 1. Interval removal of a left-sided pigtail chest tube. No significant change in a small, no greater than 10% left apical pneumothorax. 2. Unchanged dense, masslike consolidation of the right lung base. 3. Background of mild, diffuse bilateral interstitial pulmonary opacity and emphysema. Electronically Signed   By: Jearld Lesch M.D.   On: 01/03/2023 15:01   DG CHEST PORT 1 VIEW  Result Date: 01/03/2023 CLINICAL DATA:  Left chest tube.  Pneumothorax. EXAM: PORTABLE CHEST 1 VIEW COMPARISON:  Chest radiographs 01/02/2023 (multiple studies), 01/01/2023, 12/31/2022 FINDINGS: Left-sided chest tube position appears unchanged. Small left apical pneumothorax appears and SMA changed, measuring up to approximately 2 cm in craniocaudal dimension. Cardiac silhouette is again mildly enlarged. Mediastinal contours are unchanged and within normal limits. Left chest wall porta catheter tip overlies the superior right atrium, unchanged. Chronic interstitial thickening and reticular opacities with high-grade peripheral paraseptal emphysematous changes and peripheral honeycombing as seen on prior CT. Right lower lung density corresponding to the known right hilar and lower lobe primary bronchogenic  malignancy. Lower cervical spine discectomy fusion hardware. IMPRESSION: 1. Unchanged small left apical pneumothorax with left chest tube in place. 2. Chronic interstitial thickening and reticular opacities with high-grade paraseptal emphysematous changes and peripheral honeycombing as seen on prior CT. Electronically Signed   By: Kerin Salen.D.  On: 01/03/2023 10:24   DG Chest Port 1 View  Result Date: 01/02/2023 CLINICAL DATA:  Left pneumothorax EXAM: PORTABLE CHEST 1 VIEW COMPARISON:  Previous studies including the examination done earlier today FINDINGS: There is small left apical pneumothorax with no significant change. There is left chest tube with its tip in the mid lung field. Transverse diameter of heart is increased. Increased interstitial markings are seen in both lungs, mostly in right lower lung field. There is blunting of right lateral CP angle. There is no pneumothorax on the right side. Tip of left IJ chest port is seen at the junction of superior vena cava and right atrium. IMPRESSION: There is small left apical pneumothorax with no significant interval change. Left chest tube is noted in place. Increased interstitial markings in both lungs may suggest interstitial lung disease with scarring. Possibility of superimposed interstitial edema or interstitial pneumonia is not excluded. Small right pleural effusion. Electronically Signed   By: Ernie Avena M.D.   On: 01/02/2023 13:13   DG CHEST PORT 1 VIEW  Result Date: 01/02/2023 CLINICAL DATA:  [id number] Pneumothorax [id number] EXAM: PORTABLE CHEST 1 VIEW COMPARISON:  CXR 01/01/23 FINDINGS: Left-sided pleural pigtail drainage catheter in place with unchanged positioning. Left-sided needle access chest port in place with tip near the cavoatrial junction. There is persistent small left apical pneumothorax, unchanged from prior exam. Redemonstrated extensive bilateral interstitial opacities, favored to represent changes related to patient's  underlying fibrotic lung disease. Unchanged cardiac and mediastinal contours. Unchanged nodular opacity at the right lung base in the right perihilar region. IMPRESSION: Unchanged small left apical pneumothorax with left-sided pleural pigtail drainage catheter in place. Electronically Signed   By: Lorenza Cambridge M.D.   On: 01/02/2023 07:57   DG Chest Port 1 View  Result Date: 01/01/2023 CLINICAL DATA:  62 year old male with a history of small cell lung cancer on chemotherapy. Recent pneumothorax, hospitalization. Pneumothorax recurrence, chest tube in place. EXAM: PORTABLE CHEST 1 VIEW COMPARISON:  Portable chest 12/31/2022 and earlier. FINDINGS: Portable AP upright views at 0742 hours. Stable pigtail left chest tube. Residual small left apical pneumothorax, volume decreased compared to yesterday. Pleural edge no longer visible at the costophrenic angle. Otherwise stable ventilation. Abnormal right mediastinal contours. Left chest Port-A-Cath. Diffuse coarse pulmonary interstitial opacity. Visualized tracheal air column is within normal limits. Stable cholecystectomy clips. Negative visible bowel gas. IMPRESSION: 1. Stable left chest tube with decreased pneumothorax from yesterday. Small residual in the apex. 2. Otherwise stable ventilation. Abnormal lungs and right mediastinal contours, see details on Chest CT 12/29/2022. Electronically Signed   By: Odessa Fleming M.D.   On: 01/01/2023 09:36   DG Chest Port 1 View  Result Date: 12/31/2022 CLINICAL DATA:  Chest tube surveillance EXAM: PORTABLE CHEST 1 VIEW COMPARISON:  12/30/2022 FINDINGS: Enlarging left pneumothorax (estimated at approximately 25%). Left sided pigtail pleural drainage catheter is position within the left mid lung. No abnormal shift of the heart or mediastinum. Left chest port remains in place. Known right lower lobe mass. Background of interstitial lung disease. No right-sided pneumothorax. IMPRESSION: Enlarging left pneumothorax (estimated at  approximately 25%). Left sided chest tube remains in place. These results will be called to the ordering clinician or representative by the Radiologist Assistant, and communication documented in the PACS or [employer]. Electronically Signed   By: Duanne Guess D.O.   On: 12/31/2022 09:33   DG Chest Port 1 View  Result Date: 12/30/2022 CLINICAL DATA:  Chest tube. EXAM: PORTABLE CHEST 1 VIEW  COMPARISON:  12/29/2022. FINDINGS: 0601 hours. Unchanged left chest Port-A-Cath with tip projecting over the superior cavoatrial junction. Unchanged left-sided pleural drainage catheter. Small residual left apical pneumothorax. Unchanged right lower lobe mass and background of interstitial lung disease. Stable cardiac and mediastinal contours. No pleural effusion. IMPRESSION: 1. Unchanged left-sided pleural drainage catheter with small residual left apical pneumothorax. 2. Unchanged right lower lobe mass and background of interstitial lung disease. Electronically Signed   By: Orvan Falconer M.D.   On: 12/30/2022 08:55   CT CHEST WO CONTRAST  Result Date: 12/29/2022 CLINICAL DATA:  Pneumothorax EXAM: CT CHEST WITHOUT CONTRAST TECHNIQUE: Multidetector CT imaging of the chest was performed following the standard protocol without IV contrast. RADIATION DOSE REDUCTION: This exam was performed according to the departmental dose-optimization program which includes automated exposure control, adjustment of the mA and/or kV according to patient size and/or use of iterative reconstruction technique. COMPARISON:  11/24/2022 FINDINGS: Cardiovascular: Moderate multi-vessel coronary artery calcification. Global cardiac size within normal limits. No pericardial effusion. Central pulmonary arteries are enlarged in keeping with pulmonary arterial hypertension. Mild atherosclerotic calcification within the thoracic aorta. No aortic aneurysm. Left internal jugular chest port tip seen within the a right atrium. Mediastinum/Nodes:  Visualized thyroid is unremarkable. Confluent, pathologic right paratracheal, right pre-vascular, right hilar, and subcarinal adenopathy is again identified, grossly unchanged from prior examination in keeping with metastatic nodal disease. Lungs/Pleura: Anterolateral left pigtail chest tube has been placed. Tiny left anteroapical pneumothorax is present. 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 Multilobulated mass within the right lower lobe in keeping with a primary bronchogenic neoplasm in a background of underlying interstitial lung disease again noted, grossly unchanged from prior examination. No pleural effusion. No central obstructing lesion. Upper Abdomen: No acute abnormality. Musculoskeletal: No acute bone abnormality. No lytic or blastic bone lesion. IMPRESSION: 1. Interval placement of a left anterolateral pigtail chest tube. Tiny residual left anteroapical pneumothorax. 2. Stable right lower lobe mass in keeping with a primary bronchogenic neoplasm in a background of underlying interstitial lung disease. Stable extensive mediastinal and right hilar metastatic nodal disease. 3. UIP pattern fibrotic interstitial lung disease. 4. Moderate multi-vessel coronary artery calcification. 5. Morphologic changes in keeping with pulmonary arterial hypertension. Aortic Atherosclerosis (ICD10-I70.0). Electronically Signed   By: Helyn Numbers M.D.   On: 12/29/2022 23:15   DG Chest Portable 1 View  Result Date: 12/29/2022 CLINICAL DATA:  chest tube EXAM: PORTABLE CHEST 1 VIEW COMPARISON:  CXR 12/27/22 FINDINGS: Left-sided thoracostomy tube in place without a radiographically discernible pneumothorax. No pleural effusion. Unchanged cardiac and mediastinal contours. Left-sided needle accessed port in place with the tip near the cavoatrial junction. Unchanged nodular opacity at the right lung base. Unchanged extensive bilateral interstitial opacities, favored to represent sequela  fibrotic lung disease. No radiographically apparent displaced rib fractures. IMPRESSION: Left-sided thoracostomy tube in place without a radiographically discernible pneumothorax. Electronically Signed   By: Lorenza Cambridge M.D.   On: 12/29/2022 13:19   DG Chest 2 View  Result Date: 12/29/2022 CLINICAL DATA:  Worsening shortness of breath, concern for recurrent pneumothorax. EXAM: CHEST - 2 VIEW COMPARISON:  Two days ago FINDINGS: Large left pneumothorax, over 50% with left diaphragm depression. A left chest tube has been removed. Known right lower lobe mass. Extensive interstitial opacity from pulmonary fibrosis and emphysema. Left-sided porta catheter with tip at the upper right atrium. Normal heart size. There is mediastinal adenopathy less well appreciated when compared to recent CT. Critical Value/emergent results  were called by telephone at the time of interpretation on 12/29/2022 at 12:31 pm to provider Dr. Estell Harpin, who verbally acknowledged these results. IMPRESSION: Large left pneumothorax with diaphragm depression. Electronically Signed   By: Tiburcio Pea M.D.   On: 12/29/2022 12:31   DG Chest 1 View  Result Date: 12/27/2022 CLINICAL DATA:  Follow-up pneumothorax EXAM: PORTABLE CHEST 1 VIEW COMPARISON:  Film from earlier in the same day. FINDINGS: Cardiac shadow is stable. Left chest wall port is again seen. Slight increase in the left-sided pneumothorax is noted. Chest tube remains in place. Persistent airspace opacities are again noted bilaterally slightly worse on the right than the left. Bony structures appear within normal limits. IMPRESSION: Slight increase in the left-sided pneumothorax when compared with the prior exam. The remainder of the exam is stable from the prior study. Electronically Signed   By: Alcide Clever M.D.   On: 12/27/2022 15:54   DG CHEST PORT 1 VIEW  Result Date: 12/27/2022 CLINICAL DATA:  Left pneumothorax EXAM: PORTABLE CHEST 1 VIEW COMPARISON:  Previous studies  including the examination of 12/26/2022 FINDINGS: There is a interval decrease in size of left pneumothorax with small residual left pneumothorax in the periphery of left upper lung field. Left chest tube is noted in place. Transverse diameter of heart is increased. Increased interstitial markings are seen in both lungs. There are patchy densities in right mid and right lower lung fields. Right hilum is more prominent than left. There is blunting of lateral CP angles. Tip of left IJ chest port is seen at the junction of superior vena cava and right atrium. IMPRESSION: There is interval decrease in size of pneumothorax in the periphery of left upper lung field. Electronically Signed   By: Ernie Avena M.D.   On: 12/27/2022 09:16   DG CHEST PORT 1 VIEW  Result Date: 12/26/2022 CLINICAL DATA:  [id number] Chest tube in place [id number] EXAM: PORTABLE CHEST - 1 VIEW COMPARISON:  Earlier film of the same day FINDINGS: Stable left pigtail chest tube directed towards the hilum. Small scratch the apical and lateral a pneumothorax slightly more conspicuous, the lung apex projecting below the posterior aspect left third rib. Stable right IJ port catheter to the mid right atrium. Masslike opacity at the right lung base stable. Diffuse interstitial lung disease stable. Heart size normal. Aortic Atherosclerosis (ICD10-170.0). Right hilar and paratracheal fullness suggesting adenopathy. No effusion. Visualized bones unremarkable. IMPRESSION: 1. Small left apical and lateral pneumothorax, with chest tube in place. 2. Stable right lower lobe mass, and hilar adenopathy. Electronically Signed   By: Corlis Leak M.D.   On: 12/26/2022 20:33   DG CHEST PORT 1 VIEW  Result Date: 12/26/2022 CLINICAL DATA:  Chest tube in place. EXAM: PORTABLE CHEST 1 VIEW COMPARISON:  Radiograph yesterday FINDINGS: Decreased size of left pneumothorax, lateral component no longer visualized. Small residual at the apex. Left pigtail catheter remains in  place. Left chest port remains in place. Otherwise unchanged appearance of the chest with chronic lung disease and right-sided thoracic malignancy. IMPRESSION: Decreased size of left pneumothorax, small residual. Left pigtail catheter remains in place. Electronically Signed   By: Narda Rutherford M.D.   On: 12/26/2022 13:00   DG CHEST PORT 1 VIEW  Result Date: 12/25/2022 CLINICAL DATA:  Chest tube clamped. EXAM: PORTABLE CHEST 1 VIEW COMPARISON:  Earlier today. FINDINGS: Increased size of left pneumothorax, small to moderate, with increasing lateral component. For example lateral component measures 11 mm at the level of the  port, previously 4 mm. Pigtail catheter in the left mid lung again seen. The exam is otherwise unchanged IMPRESSION: Increased size of left pneumothorax, small to moderate, with increasing lateral component. Pigtail catheter remains in place. Electronically Signed   By: Narda Rutherford M.D.   On: 12/25/2022 20:49   DG CHEST PORT 1 VIEW  Result Date: 12/25/2022 CLINICAL DATA:  Chest tube in place, on water seal. EXAM: PORTABLE CHEST 1 VIEW COMPARISON:  Earlier today FINDINGS: Slight increased size of left pneumothorax with chest tube on water seal, still small, however now visualized laterally. Pigtail catheter in place unchanged in positioning. Left chest port remains in place. Chronic lung disease and known right lung malignancy are unchanged. IMPRESSION: Slight increased size of left pneumothorax with chest tube on water seal, still small, however now visualized laterally. Left pigtail catheter in place. Electronically Signed   By: Narda Rutherford M.D.   On: 12/25/2022 20:47   DG CHEST PORT 1 VIEW  Result Date: 12/25/2022 CLINICAL DATA:  Chest tube in place, no pneumothorax. EXAM: PORTABLE CHEST 1 VIEW COMPARISON:  Most recent radiograph yesterday. FINDINGS: Unchanged small left apical pneumothorax from yesterday. Left-sided pigtail catheter remains in place. Left chest port in  place. The exam is otherwise unchanged with sequela of right lung malignancy and chronic lung disease. IMPRESSION: 1. Unchanged small left apical pneumothorax with left chest tube in place. 2. Unchanged right lung malignancy and chronic lung disease. Electronically Signed   By: Narda Rutherford M.D.   On: 12/25/2022 10:31   DG Chest Port 1 View  Result Date: 12/24/2022 CLINICAL DATA:  Pneumothorax. EXAM: PORTABLE CHEST 1 VIEW COMPARISON:  Radiograph yesterday FINDINGS: Left-sided pigtail catheter remains in place. Stable size of small left apical pneumothorax. Left chest port remains in place. Known right lung malignancy and chronic lung disease are otherwise unchanged. IMPRESSION: 1. Unchanged small left apical pneumothorax. Left chest tube remains in place. 2. Unchanged right lung malignancy and chronic lung disease. Electronically Signed   By: Narda Rutherford M.D.   On: 12/24/2022 11:16   DG Chest Port 1 View  Result Date: 12/23/2022 CLINICAL DATA:  Shortness of breath. EXAM: PORTABLE CHEST 1 VIEW COMPARISON:  Chest x-ray Dec 23, 2022. FINDINGS: Left chest tube in place with decreased size of the left pneumothorax, now small. Similar patchy interstitial airspace opacities. No new masses better characterized on recent CT chest. No mediastinal shift. Otherwise, cardiomediastinal silhouette is similar. Left subclavian approach Port-A-Cath with tip in the right atrium. IMPRESSION: Left chest tube in place with decreased size of the left pneumothorax, now small. Electronically Signed   By: Feliberto Harts M.D.   On: 12/23/2022 11:41   DG Chest Port 1 View  Result Date: 12/23/2022 CLINICAL DATA:  sob EXAM: PORTABLE CHEST 1 VIEW COMPARISON:  November 10, 2022. FINDINGS: Large left pneumothorax. Some rightward mediastinal shift. Otherwise, cardiomediastinal silhouette is similar. Left subclavian approach cardiac rhythm maintenance device with the tip projecting at the right atrium. Patchy interstitial and  airspace opacities throughout the lungs. Known masses better characterized on recent CT chest. IMPRESSION: 1. Large left pneumothorax. Some rightward mediastinal shift raises concern for tension physiology component. 2. Known masses better characterized on recent CT chest. 3. Additional patchy interstitial and airspace opacities throughout the lungs could represent changes of emphysema but superimposed infection or aspiration is difficult to exclude. Findings discussed with Dr. Charm Barges via telephone at 10:26 AM. Electronically Signed   By: Feliberto Harts M.D.   On: 12/23/2022 10:28  MR Brain W Wo Contrast  Result Date: 12/16/2022 CLINICAL DATA:  Non-small cell lung cancer, staging a EXAM: MRI HEAD WITHOUT AND WITH CONTRAST TECHNIQUE: Multiplanar, multiecho pulse sequences of the brain and surrounding structures were obtained without and with intravenous contrast. CONTRAST:  6mL GADAVIST GADOBUTROL 1 MMOL/ML IV SOLN COMPARISON:  04/13/2017 FINDINGS: Evaluation is somewhat limited by motion artifact. Brain: Linear area of restricted diffusion in the posterior right cerebellum, which measures up to 18 x 6 x 8 mm (AP x TR x CC). Additional smaller areas of restricted diffusion with ADC correlates are noted in the bilateral cerebellar hemispheres (series 5, images 64 and 66) and left paramedian pons (series 5, image 64), which are T2 hyperintense and nonenhancing, concerning for acute infarcts. Additional punctate foci of increased diffusivity without definite ADC correlate in the right posterior frontal white matter (series 5, image 88), the left occipital lobe (series 5, image 80), and right caudate tail (series 5, image 81), concerning for subacute infarcts. Diffusion restricting focus with ADC correlate in the anteromedial right frontal cortex (series 5, image 81) correlates with possible contrast enhancement (series 19, image 26 and series 18, image 32), which could represent metastatic disease. No  significant associated T2 hyperintense signal. No other abnormal parenchymal enhancement. No acute hemorrhage, mass, mass effect, or midline shift. No hydrocephalus or extra-axial collection. Normal pituitary and craniocervical junction. No hemosiderin deposition to suggest remote hemorrhage. Normal cerebral volume for age. Scattered T2 hyperintense signal in the periventricular white matter, likely the sequela of mild-to-moderate chronic small vessel ischemic disease. Vascular: Normal arterial flow voids. Normal arterial and venous enhancement. Skull and upper cervical spine: Normal marrow signal. Sinuses/Orbits: Air-fluid level in the right maxillary sinus. Mucosal thickening in the left maxillary sinus. No acute finding in the orbits. Other: The mastoid air cells are well aerated. IMPRESSION: 1. Evaluation is somewhat limited by motion artifact. 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 2. Additional focus of diffusion restriction in the medial left frontal cortex which correlates with possible contrast enhancement, although this is not well defined, in part due to motion. This is concerning for a focus of metastatic disease but could also represent an acute infarct. Attention on follow-up. 3. Additional likely subacute infarcts in the right posterior frontal lobe, left occipital lobe, and right caudate tail. 4. Air-fluid level in the right maxillary sinus, which is nonspecific but can be seen in the setting of acute sinusitis. Electronically Signed   By: Wiliam Ke M.D.   On: 12/16/2022 14:40   NM PET Image Initial (PI) Skull Base To Thigh  Result Date: 12/16/2022 CLINICAL DATA:  Initial treatment strategy for non-small cell lung cancer. EXAM: NUCLEAR MEDICINE PET SKULL BASE TO THIGH TECHNIQUE: 6.8 mCi F-18 FDG was injected intravenously. Full-ring PET imaging was performed from the skull base to thigh after the radiotracer. CT data  was obtained and used for attenuation correction and anatomic localization. Fasting blood glucose: 111 mg/dl COMPARISON:  CT chest 16/05/9603. FINDINGS: Mediastinal blood pool activity: SUV max 1.7 Liver activity: SUV max NA NECK: Hypermetabolic posterior left level 2 lymph node measures 8 mm (3/33), SUV max 2.1. No additional abnormal hypermetabolism. Incidental CT findings: None. CHEST: Extensive hypermetabolic adenopathy in the low internal jugular, supraclavicular, mediastinal and right hilar stations. Index low right paratracheal nodal mass measures approximately 3.7 cm (3/93), SUV max 6.5. Masslike consolidation in the right lower lobe is somewhat difficult to measure but collectively, measures approximately  8.9 x 9.1 cm (7/74), with SUV max measured superiorly, 8.2. Incidental CT findings: Left IJ Port-A-Cath terminates in the right atrium. Atherosclerotic calcification of the aorta and coronary arteries. Heart is at the upper limits of normal in size. Trace pericardial effusion. Small loculated right pleural effusion. Paraseptal emphysema. Suspect basilar honeycombing. ABDOMEN/PELVIS: Vague activity in the periportal region without a definite CT correlate. High left periaortic lymph nodes measure up to approximately 7 mm (3/155), SUV max 2.7. Small nodules posterior to the right kidney measure up to 10 mm (3/186) do not show abnormal hypermetabolism. No additional abnormal hypermetabolism. Incidental CT findings: Liver, adrenal glands, kidneys, spleen, pancreas, stomach and bowel are grossly unremarkable. Cholecystectomy. Prostate is slightly enlarged. SKELETON: No abnormal hypermetabolism. Incidental CT findings: Degenerative changes in the spine. Postoperative changes in the lumbar spine. IMPRESSION: 1. 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 2. Small nodules posterior to  the right kidney do not show abnormal hypermetabolism. Recommend attention on follow-up. 3. Trace pericardial effusion. 4. Small loculated right pleural effusion. 5. 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 6. Mildly enlarged prostate. 7. Aortic atherosclerosis (ICD10-I70.0). Coronary artery calcification. Electronically Signed   By: Leanna Battles M.D.   On: 12/16/2022 14:39   IR IMAGING GUIDED PORT INSERTION  Result Date: 12/14/2022 INDICATION: SCLC EXAM: IMPLANTED PORT A CATH PLACEMENT WITH ULTRASOUND AND FLUOROSCOPIC GUIDANCE MEDICATIONS: None ANESTHESIA/SEDATION: Moderate (conscious) sedation was employed during this procedure. A total of Versed 2 mg and Fentanyl 100 mcg was administered intravenously. Moderate Sedation Time: 33 minutes. The patient's level of consciousness and vital signs were monitored continuously by radiology nursing throughout the procedure under my direct supervision. FLUOROSCOPY TIME:  Fluoroscopic dose; 1 mGy COMPLICATIONS: None immediate. PROCEDURE: The procedure, risks, benefits, and alternatives were explained to the patient. Questions regarding the procedure were encouraged and answered. The patient understands and consents to the procedure. The LEFT neck and chest were prepped with chlorhexidine in a sterile fashion, and a sterile drape was applied covering the operative field. Maximum barrier sterile technique with sterile gowns and gloves were used for the procedure. A timeout was performed prior to the initiation of the procedure. Local anesthesia was provided with 1% lidocaine with epinephrine. After creating a small venotomy incision, a micropuncture kit was utilized to access the internal jugular vein under direct, real-time ultrasound guidance. Ultrasound image documentation was performed. The microwire was kinked to measure appropriate catheter length. A subcutaneous port pocket was then created along the upper chest wall utilizing a  combination of sharp and blunt dissection. The pocket was irrigated with sterile saline. A single lumen ISP power injectable port was chosen for placement. The 8 Fr catheter was tunneled from the port pocket site to the venotomy incision. The port was placed in the pocket. The external catheter was trimmed to appropriate length. At the venotomy, an 8 Fr peel-away sheath was placed over a guidewire under fluoroscopic guidance. The catheter was then placed through the sheath and the sheath was removed. Final catheter positioning was confirmed and documented with a fluoroscopic spot radiograph. The port was accessed with a Huber needle, aspirated and flushed with heparinized saline. The port pocket incision was closed with interrupted 3-0 Vicryl suture then Dermabond was applied, including at the venotomy incision. Dressings were placed. The patient tolerated the procedure well without immediate post procedural complication. IMPRESSION: Successful placement of a LEFT internal jugular  approach power injectable Port-A-Cath. The tip of the catheter is positioned within the proximal RIGHT atrium. The catheter is ready for immediate use. Roanna Banning, MD [facility] Electronically Signed   By: Roanna Banning M.D.   On: 12/14/2022 11:06   [place] CORE BIOPSY (LYMPH NODES)  Result Date: 12/12/2022 INDICATION: R Lung Mass with R Supraclavicular Lymph Node EXAM: ULTRASOUND-GUIDED RIGHT SUPRACLAVICULAR LYMPH NODE BIOPSY COMPARISON:  CT chest, 11/24/2022. MEDICATIONS: 5 mL lidocaine 1% ANESTHESIA/SEDATION: Local anesthetic was administered. COMPLICATIONS: None immediate. TECHNIQUE: Informed written consent was obtained from the patient and/or patient's representative after a discussion of the risks, benefits and alternatives to treatment. Questions regarding the procedure were encouraged and answered. Initial ultrasound scanning demonstrated enlarged RIGHT supraclavicular  lymph mass. An ultrasound image was saved for documentation purposes. The procedure was planned. A timeout was performed prior to the initiation of the procedure. The operative was prepped and draped in the usual sterile fashion, and a sterile drape was applied covering the operative field. A timeout was performed prior to the initiation of the procedure. Local anesthesia was provided with 1% lidocaine. Under direct ultrasound guidance, an 18 gauge core needle device was utilized to obtain to obtain 4 core needle biopsies of the RIGHT supraclavicular lymph node. The samples were placed in saline and submitted to pathology. The needle was removed and superficial hemostasis was achieved with manual compression. Post procedure scan was negative for significant hematoma. A dressing was applied. The patient tolerated the procedure well without immediate postprocedural complication. IMPRESSION: Successful ultrasound guided biopsy of the enlarged RIGHT supraclavicular lymph node. Roanna Banning, MD [facility] Electronically Signed   By: Roanna Banning M.D.   On: 12/12/2022 14:46

## 2023-01-09 ENCOUNTER — Inpatient Hospital Stay (HOSPITAL_BASED_OUTPATIENT_CLINIC_OR_DEPARTMENT_OTHER): Payer: 59 | Admitting: Hematology

## 2023-01-09 ENCOUNTER — Inpatient Hospital Stay: Payer: 59

## 2023-01-09 ENCOUNTER — Inpatient Hospital Stay: Payer: 59 | Admitting: Hematology

## 2023-01-09 ENCOUNTER — Inpatient Hospital Stay: Payer: 59 | Admitting: Dietician

## 2023-01-09 VITALS — BP 124/83 | HR 105 | Temp 98.0°F | Resp 20

## 2023-01-09 DIAGNOSIS — C3491 Malignant neoplasm of unspecified part of right bronchus or lung: Secondary | ICD-10-CM

## 2023-01-09 DIAGNOSIS — Z95828 Presence of other vascular implants and grafts: Secondary | ICD-10-CM

## 2023-01-09 DIAGNOSIS — C770 Secondary and unspecified malignant neoplasm of lymph nodes of head, face and neck: Secondary | ICD-10-CM | POA: Diagnosis not present

## 2023-01-09 DIAGNOSIS — Z87891 Personal history of nicotine dependence: Secondary | ICD-10-CM | POA: Diagnosis not present

## 2023-01-09 DIAGNOSIS — Z5111 Encounter for antineoplastic chemotherapy: Secondary | ICD-10-CM | POA: Diagnosis present

## 2023-01-09 DIAGNOSIS — C3431 Malignant neoplasm of lower lobe, right bronchus or lung: Secondary | ICD-10-CM | POA: Diagnosis not present

## 2023-01-09 DIAGNOSIS — Z5112 Encounter for antineoplastic immunotherapy: Secondary | ICD-10-CM | POA: Diagnosis present

## 2023-01-09 LAB — CBC WITH DIFFERENTIAL/PLATELET
Abs Immature Granulocytes: 0.04 10*3/uL (ref 0.00–0.07)
Basophils Absolute: 0.1 10*3/uL (ref 0.0–0.1)
Basophils Relative: 1 %
Eosinophils Absolute: 0.2 10*3/uL (ref 0.0–0.5)
Eosinophils Relative: 3 %
HCT: 36 % — ABNORMAL LOW (ref 39.0–52.0)
Hemoglobin: 11.8 g/dL — ABNORMAL LOW (ref 13.0–17.0)
Immature Granulocytes: 1 %
Lymphocytes Relative: 20 %
Lymphs Abs: 1.1 10*3/uL (ref 0.7–4.0)
MCH: 29.4 pg (ref 26.0–34.0)
MCHC: 32.8 g/dL (ref 30.0–36.0)
MCV: 89.8 fL (ref 80.0–100.0)
Monocytes Absolute: 0.8 10*3/uL (ref 0.1–1.0)
Monocytes Relative: 13 %
Neutro Abs: 3.6 10*3/uL (ref 1.7–7.7)
Neutrophils Relative %: 62 %
Platelets: 378 10*3/uL (ref 150–400)
RBC: 4.01 MIL/uL — ABNORMAL LOW (ref 4.22–5.81)
RDW: 14.6 % (ref 11.5–15.5)
WBC: 5.8 10*3/uL (ref 4.0–10.5)
nRBC: 0 % (ref 0.0–0.2)

## 2023-01-09 LAB — COMPREHENSIVE METABOLIC PANEL
ALT: 19 U/L (ref 0–44)
AST: 14 U/L — ABNORMAL LOW (ref 15–41)
Albumin: 3.1 g/dL — ABNORMAL LOW (ref 3.5–5.0)
Alkaline Phosphatase: 62 U/L (ref 38–126)
Anion gap: 8 (ref 5–15)
BUN: 12 mg/dL (ref 6–20)
CO2: 28 mmol/L (ref 22–32)
Calcium: 8.9 mg/dL (ref 8.9–10.3)
Chloride: 99 mmol/L (ref 98–111)
Creatinine, Ser: 0.66 mg/dL (ref 0.61–1.24)
GFR, Estimated: >60 mL/min (ref >60)
Glucose, Bld: 141 mg/dL — ABNORMAL HIGH (ref 70–99)
Potassium: 3.6 mmol/L (ref 3.5–5.1)
Sodium: 135 mmol/L (ref 135–145)
Total Bilirubin: 0.4 mg/dL (ref 0.3–1.2)
Total Protein: 7.1 g/dL (ref 6.5–8.1)

## 2023-01-09 LAB — MAGNESIUM: Magnesium: 1.8 mg/dL (ref 1.7–2.4)

## 2023-01-09 MED ORDER — OXYCODONE HCL 15 MG PO TABS: 15.0000 mg | ORAL_TABLET | Freq: Four times a day (QID) | ORAL | 0 refills | Status: DC

## 2023-01-09 MED ORDER — SODIUM CHLORIDE 0.9 % IV SOLN
10.0000 mg | Freq: Once | INTRAVENOUS | Status: AC
  Administered 2023-01-09: 10 mg via INTRAVENOUS
  Filled 2023-01-09: qty 10

## 2023-01-09 MED ORDER — HEPARIN SOD (PORK) LOCK FLUSH 100 UNIT/ML IV SOLN
500.0000 [IU] | Freq: Once | INTRAVENOUS | Status: AC | PRN
  Administered 2023-01-09: 500 [IU]

## 2023-01-09 MED ORDER — PALONOSETRON HCL INJECTION 0.25 MG/5ML
0.2500 mg | Freq: Once | INTRAVENOUS | Status: AC
  Administered 2023-01-09: 0.25 mg via INTRAVENOUS
  Filled 2023-01-09: qty 5

## 2023-01-09 MED ORDER — SODIUM CHLORIDE 0.9 % IV SOLN
100.0000 mg/m2 | Freq: Once | INTRAVENOUS | Status: AC
  Administered 2023-01-09: 174 mg via INTRAVENOUS
  Filled 2023-01-09: qty 8.7

## 2023-01-09 MED ORDER — SODIUM CHLORIDE 0.9 % IV SOLN
150.0000 mg | Freq: Once | INTRAVENOUS | Status: AC
  Administered 2023-01-09: 150 mg via INTRAVENOUS
  Filled 2023-01-09: qty 150

## 2023-01-09 MED ORDER — SODIUM CHLORIDE 0.9 % IV SOLN: Freq: Once | INTRAVENOUS | Status: AC

## 2023-01-09 MED ORDER — SODIUM CHLORIDE 0.9 % IV SOLN
541.0000 mg | Freq: Once | INTRAVENOUS | Status: AC
  Administered 2023-01-09: 540 mg via INTRAVENOUS
  Filled 2023-01-09: qty 54

## 2023-01-09 MED ORDER — BENZONATATE 200 MG PO CAPS: 200.0000 mg | ORAL_CAPSULE | Freq: Three times a day (TID) | ORAL | 2 refills | Status: DC | PRN

## 2023-01-09 MED ORDER — SODIUM CHLORIDE 0.9 % IV SOLN
1500.0000 mg | Freq: Once | INTRAVENOUS | Status: AC
  Administered 2023-01-09: 1500 mg via INTRAVENOUS
  Filled 2023-01-09: qty 30

## 2023-01-09 MED ORDER — SODIUM CHLORIDE 0.9% FLUSH
10.0000 mL | Freq: Once | INTRAVENOUS | Status: AC
  Administered 2023-01-09: 10 mL via INTRAVENOUS

## 2023-01-09 MED ORDER — SODIUM CHLORIDE 0.9% FLUSH
10.0000 mL | INTRAVENOUS | Status: DC | PRN
  Administered 2023-01-09: 10 mL

## 2023-01-09 NOTE — Progress Notes (Signed)
Patient has been examined by Dr. Ellin Saba. Vital signs (HR 116) and labs have been reviewed by MD - ANC, Creatinine, LFTs, hemoglobin, and platelets are within treatment parameters per M.D. - pt may proceed with treatment.  Primary RN and pharmacy notified.

## 2023-01-09 NOTE — Progress Notes (Signed)
Patient presents today for Imfinzi, Carboplatin, and Etoposide infusion. Patient is in satisfactory condition with no new complaints voiced.  Vital signs are stable.  Labs reviewed by Dr. Ellin Saba during the office visit and all labs are within treatment parameters.  We will proceed with treatment per MD orders.   Treatment given today per MD orders. Tolerated infusion without adverse affects. Vital signs stable. No complaints at this time. Discharged from clinic ambulatory in stable condition. Alert and oriented x 3. F/U with [facility] as scheduled.

## 2023-01-09 NOTE — Progress Notes (Signed)
Nutrition Assessment   Reason for Assessment: MD referral (wt loss, decreased appetite)   ASSESSMENT: 62 year old male with extensive stage small cell lung cancer. He is currently receiving durvalumab, carboplatin + etoposide q21d (first 5/20). Patient is under the care of Dr. Ellin Saba.  Past medical history includes TIA, HTN, idiopathic pulmonary fibrosis, emphysema, chronic respiratory failure with hypoxia (6L continuous oxygen), cervical spondylosis with radiculopathy  5/24-5/28 hospital admission with left-sided pneumothorax s/p chest tube 5/30-6/4 hospital admission with recurrence of left pneumothorax s/p bronchoscopy with placement of endobronchial valves   Patient sleeping soundly at time of visit. Wife is present and provides history. She reports appetite is slowly improving, specifically the last couple of days. Patient has not started taking appetite stimulant (megace). Wife reports mild dysphagia with solids and breads since hospital discharge. She feels this is related to esophageal swelling s/p recent procedures. Wife reports patient usually eats 2 small meals plus frequent snacks (pudding, mac/cheese. Ice cream) He likes Ensure, but does not drink these regularly. Yesterday pt ate 2 pancakes, scrambled eggs with sausage gravy for breakfast. Patient fell asleep before dinner, but was hungry upon waking. Patient ate 2 chicken tenders. He did not want the string beans and creamed potatoes. Wife reports she has been cooking a lot at home. She is running low on food and needs to go to the store. Patient has been drinking more water recently. His mouth stays dry with continuous oxygen.   Nutrition Focused Physical Exam:   Orbital Region: severe Buccal Region: severe Upper Arm Region: Air cabin crew and Lumbar Region: Lucianne Lei Region: severe Clavicle Bone Region: uta Shoulder and Acromion Bone Region: severe Scapular Bone Region: severe Dorsal Hand: uta Patellar Region:  uta Anterior Thigh Region: uta Posterior Calf Region: uta Edema (RD assessment): uta Hair: reviewed Eyes: uta Mouth: uta Skin: reviewed  Nails: reviewed    Medications: megace, MMW, pepcid, lopressor, oxycodone, roxicodone, compazine, trazodone   Labs: glucose 141, albumin 3.1   Anthropometrics: Wts have decreased 17% (25 lbs) from usual weight in 4 months - this is severe for time frame  Height: 6'1" Weight: 126 lb  UBW: 151 lb 12.8 oz (09/01/22) BMI: 17.4 (underweight)   NUTRITION DIAGNOSIS: Unintended weight loss related to chronic illness (SCLC, COPD on 6L continuous O2, ILD) as evidenced by 17% wt loss in the last 4 months   MALNUTRITION DIAGNOSIS: Patient underweight per BMI and meets criteria for severe malnutrition in the context of chronic disease as evidenced by severe fat and muscle depletion, wt loss as noted above   INTERVENTION:  Discussed strategies for increasing calories and protein, encouraged small frequent meals/snacks q2h - high calorie high protein snack ideas provided Educated on ways to alter the texture of foods + discussed soft moist high protein foods for ease of intake - handout provided  Continue drinking Ensure Plus HP/equivalent, recommend 2-3 daily in between meals as tolerated One bag of food + one case Ensure Plus HP provided given food insecurity and severe malnutrition    MONITORING, EVALUATION, GOAL: Patient will tolerate increased calories and protein to minimize further weight loss during treatment   Next Visit: Monday July 1 during infusion

## 2023-01-09 NOTE — Patient Instructions (Signed)
[facility]  Discharge Instructions: Thank you for choosing [facility] to provide your oncology and hematology care.  If you have a lab appointment with the Cancer Center - please note that after April 8th, 2024, all labs will be drawn in the cancer center.  You do not have to check in or register with the main entrance as you have in the past but will complete your check-in in the cancer center.  Wear comfortable clothing and clothing appropriate for easy access to any Portacath or PICC line.   We strive to give you quality time with your provider. You may need to reschedule your appointment if you arrive late (15 or more minutes).  Arriving late affects you and other patients whose appointments are after yours.  Also, if you miss three or more appointments without notifying the office, you may be dismissed from the clinic at the provider's discretion.      For prescription refill requests, have your pharmacy contact our office and allow 72 hours for refills to be completed.    Today you received the following chemotherapy and/or immunotherapy agents Imfinzi, Carboplatin, and Etoposide   To help prevent nausea and vomiting after your treatment, we encourage you to take your nausea medication as directed.  Durvalumab Injection What is this medication? DURVALUMAB (dur VAL ue mab) treats some types of cancer. It works by helping your immune system slow or stop the spread of cancer cells. It is a monoclonal antibody. This medicine may be used for other purposes; ask your health care provider or pharmacist if you have questions. COMMON BRAND NAME(S): IMFINZI What should I tell my care team before I take this medication? They need to know if you have any of these conditions: Allogeneic stem cell transplant (uses someone else's stem cells) Autoimmune diseases, such as Crohn disease, ulcerative colitis, lupus History of chest radiation Nervous system problems,  such as Guillain-Barre syndrome, myasthenia gravis Organ transplant An unusual or allergic reaction to durvalumab, other medications, foods, dyes, or preservatives Pregnant or trying to get pregnant Breast-feeding How should I use this medication? This medication is infused into a vein. It is given by your care team in a hospital or clinic setting. A special MedGuide will be given to you before each treatment. Be sure to read this information carefully each time. Talk to your care team about the use of this medication in children. Special care may be needed. Overdosage: If you think you have taken too much of this medicine contact a poison control center or emergency room at once. NOTE: This medicine is only for you. Do not share this medicine with others. What if I miss a dose? Keep appointments for follow-up doses. It is important not to miss your dose. Call your care team if you are unable to keep an appointment. What may interact with this medication? Interactions have not been studied. This list may not describe all possible interactions. Give your health care provider a list of all the medicines, herbs, non-prescription drugs, or dietary supplements you use. Also tell them if you smoke, drink alcohol, or use illegal drugs. Some items may interact with your medicine. What should I watch for while using this medication? Your condition will be monitored carefully while you are receiving this medication. You may need blood work while taking this medication. This medication may cause serious skin reactions. They can happen weeks to months after starting the medication. Contact your care team right away if  you notice fevers or flu-like symptoms with a rash. The rash may be red or purple and then turn into blisters or peeling of the skin. You may also notice a red rash with swelling of the face, lips, or lymph nodes in your neck or under your arms. Tell your care team right away if you have any  change in your eyesight. Talk to your care team if you may be pregnant. Serious birth defects can occur if you take this medication during pregnancy and for 3 months after the last dose. You will need a negative pregnancy test before starting this medication. Contraception is recommended while taking this medication and for 3 months after the last dose. Your care team can help you find the option that works for you. Do not breastfeed while taking this medication and for 3 months after the last dose. What side effects may I notice from receiving this medication? Side effects that you should report to your care team as soon as possible: Allergic reactions--skin rash, itching, hives, swelling of the face, lips, tongue, or throat Dry cough, shortness of breath or trouble breathing Eye pain, redness, irritation, or discharge with blurry or decreased vision Heart muscle inflammation--unusual weakness or fatigue, shortness of breath, chest pain, fast or irregular heartbeat, dizziness, swelling of the ankles, feet, or hands Hormone gland problems--headache, sensitivity to light, unusual weakness or fatigue, dizziness, fast or irregular heartbeat, increased sensitivity to cold or heat, excessive sweating, constipation, hair loss, increased thirst or amount of urine, tremors or shaking, irritability Infusion reactions--chest pain, shortness of breath or trouble breathing, feeling faint or lightheaded Kidney injury (glomerulonephritis)--decrease in the amount of urine, red or dark brown urine, foamy or bubbly urine, swelling of the ankles, hands, or feet Liver injury--right upper belly pain, loss of appetite, nausea, light-colored stool, dark yellow or brown urine, yellowing skin or eyes, unusual weakness or fatigue Pain, tingling, or numbness in the hands or feet, muscle weakness, change in vision, confusion or trouble speaking, loss of balance or coordination, trouble walking, seizures Rash, fever, and swollen  lymph nodes Redness, blistering, peeling, or loosening of the skin, including inside the mouth Sudden or severe stomach pain, bloody diarrhea, fever, nausea, vomiting Side effects that usually do not require medical attention (report these to your care team if they continue or are bothersome): Bone, joint, or muscle pain Diarrhea Fatigue Loss of appetite Nausea Skin rash This list may not describe all possible side effects. Call your doctor for medical advice about side effects. You may report side effects to FDA at 1-800-FDA-1088. Where should I keep my medication? This medication is given in a hospital or clinic. It will not be stored at home. NOTE: This sheet is a summary. It may not cover all possible information. If you have questions about this medicine, talk to your doctor, pharmacist, or health care provider.  2024 Elsevier/Gold Standard (2021-11-30 00:00:00)  Carboplatin Injection What is this medication? CARBOPLATIN (KAR boe pla tin) treats some types of cancer. It works by slowing down the growth of cancer cells. This medicine may be used for other purposes; ask your health care provider or pharmacist if you have questions. COMMON BRAND NAME(S): Paraplatin What should I tell my care team before I take this medication? They need to know if you have any of these conditions: Blood disorders Hearing problems Kidney disease Recent or ongoing radiation therapy An unusual or allergic reaction to carboplatin, cisplatin, other medications, foods, dyes, or preservatives Pregnant or trying to get  pregnant Breast-feeding How should I use this medication? This medication is injected into a vein. It is given by your care team in a hospital or clinic setting. Talk to your care team about the use of this medication in children. Special care may be needed. Overdosage: If you think you have taken too much of this medicine contact a poison control center or emergency room at once. NOTE:  This medicine is only for you. Do not share this medicine with others. What if I miss a dose? Keep appointments for follow-up doses. It is important not to miss your dose. Call your care team if you are unable to keep an appointment. What may interact with this medication? Medications for seizures Some antibiotics, such as amikacin, gentamicin, neomycin, streptomycin, tobramycin Vaccines This list may not describe all possible interactions. Give your health care provider a list of all the medicines, herbs, non-prescription drugs, or dietary supplements you use. Also tell them if you smoke, drink alcohol, or use illegal drugs. Some items may interact with your medicine. What should I watch for while using this medication? Your condition will be monitored carefully while you are receiving this medication. You may need blood work while taking this medication. This medication may make you feel generally unwell. This is not uncommon, as chemotherapy can affect healthy cells as well as cancer cells. Report any side effects. Continue your course of treatment even though you feel ill unless your care team tells you to stop. In some cases, you may be given additional medications to help with side effects. Follow all directions for their use. This medication may increase your risk of getting an infection. Call your care team for advice if you get a fever, chills, sore throat, or other symptoms of a cold or flu. Do not treat yourself. Try to avoid being around people who are sick. Avoid taking medications that contain aspirin, acetaminophen, ibuprofen, naproxen, or ketoprofen unless instructed by your care team. These medications may hide a fever. Be careful brushing or flossing your teeth or using a toothpick because you may get an infection or bleed more easily. If you have any dental work done, tell your dentist you are receiving this medication. Talk to your care team if you wish to become pregnant or think  you might be pregnant. This medication can cause serious birth defects. Talk to your care team about effective forms of contraception. Do not breast-feed while taking this medication. What side effects may I notice from receiving this medication? Side effects that you should report to your care team as soon as possible: Allergic reactions--skin rash, itching, hives, swelling of the face, lips, tongue, or throat Infection--fever, chills, cough, sore throat, wounds that don't heal, pain or trouble when passing urine, general feeling of discomfort or being unwell Low red blood cell level--unusual weakness or fatigue, dizziness, headache, trouble breathing Pain, tingling, or numbness in the hands or feet, muscle weakness, change in vision, confusion or trouble speaking, loss of balance or coordination, trouble walking, seizures Unusual bruising or bleeding Side effects that usually do not require medical attention (report to your care team if they continue or are bothersome): Hair loss Nausea Unusual weakness or fatigue Vomiting This list may not describe all possible side effects. Call your doctor for medical advice about side effects. You may report side effects to FDA at 1-800-FDA-1088. Where should I keep my medication? This medication is given in a hospital or clinic. It will not be stored at home. NOTE: This  sheet is a summary. It may not cover all possible information. If you have questions about this medicine, talk to your doctor, pharmacist, or health care provider.  2024 Elsevier/Gold Standard (2021-11-09 00:00:00)  Etoposide Injection What is this medication? ETOPOSIDE (e toe POE side) treats some types of cancer. It works by slowing down the growth of cancer cells. This medicine may be used for other purposes; ask your health care provider or pharmacist if you have questions. COMMON BRAND NAME(S): Etopophos, Toposar, VePesid What should I tell my care team before I take this  medication? They need to know if you have any of these conditions: Infection Kidney disease Liver disease Low blood counts, such as low white cell, platelet, red cell counts An unusual or allergic reaction to etoposide, other medications, foods, dyes, or preservatives If you or your partner are pregnant or trying to get pregnant Breastfeeding How should I use this medication? This medication is injected into a vein. It is given by your care team in a hospital or clinic setting. Talk to your care team about the use of this medication in children. Special care may be needed. Overdosage: If you think you have taken too much of this medicine contact a poison control center or emergency room at once. NOTE: This medicine is only for you. Do not share this medicine with others. What if I miss a dose? Keep appointments for follow-up doses. It is important not to miss your dose. Call your care team if you are unable to keep an appointment. What may interact with this medication? Warfarin This list may not describe all possible interactions. Give your health care provider a list of all the medicines, herbs, non-prescription drugs, or dietary supplements you use. Also tell them if you smoke, drink alcohol, or use illegal drugs. Some items may interact with your medicine. What should I watch for while using this medication? Your condition will be monitored carefully while you are receiving this medication. This medication may make you feel generally unwell. This is not uncommon as chemotherapy can affect healthy cells as well as cancer cells. Report any side effects. Continue your course of treatment even though you feel ill unless your care team tells you to stop. This medication can cause serious side effects. To reduce the risk, your care team may give you other medications to take before receiving this one. Be sure to follow the directions from your care team. This medication may increase your risk of  getting an infection. Call your care team for advice if you get a fever, chills, sore throat, or other symptoms of a cold or flu. Do not treat yourself. Try to avoid being around people who are sick. This medication may increase your risk to bruise or bleed. Call your care team if you notice any unusual bleeding. Talk to your care team about your risk of cancer. You may be more at risk for certain types of cancers if you take this medication. Talk to your care team if you may be pregnant. Serious birth defects can occur if you take this medication during pregnancy and for 6 months after the last dose. You will need a negative pregnancy test before starting this medication. Contraception is recommended while taking this medication and for 6 months after the last dose. Your care team can help you find the option that works for you. If your partner can get pregnant, use a condom during sex while taking this medication and for 4 months after the last  dose. Do not breastfeed while taking this medication. This medication may cause infertility. Talk to your care team if you are concerned about your fertility. What side effects may I notice from receiving this medication? Side effects that you should report to your care team as soon as possible: Allergic reactions--skin rash, itching, hives, swelling of the face, lips, tongue, or throat Infection--fever, chills, cough, sore throat, wounds that don't heal, pain or trouble when passing urine, general feeling of discomfort or being unwell Low red blood cell level--unusual weakness or fatigue, dizziness, headache, trouble breathing Unusual bruising or bleeding Side effects that usually do not require medical attention (report to your care team if they continue or are bothersome): Diarrhea Fatigue Hair loss Loss of appetite Nausea Vomiting This list may not describe all possible side effects. Call your doctor for medical advice about side effects. You may  report side effects to FDA at 1-800-FDA-1088. Where should I keep my medication? This medication is given in a hospital or clinic. It will not be stored at home. NOTE: This sheet is a summary. It may not cover all possible information. If you have questions about this medicine, talk to your doctor, pharmacist, or health care provider.  2024 Elsevier/Gold Standard (2021-12-09 00:00:00)    BELOW ARE SYMPTOMS THAT SHOULD BE REPORTED IMMEDIATELY: *FEVER GREATER THAN 100.4 F (38 C) OR HIGHER *CHILLS OR SWEATING *NAUSEA AND VOMITING THAT IS NOT CONTROLLED WITH YOUR NAUSEA MEDICATION *UNUSUAL SHORTNESS OF BREATH *UNUSUAL BRUISING OR BLEEDING *URINARY PROBLEMS (pain or burning when urinating, or frequent urination) *BOWEL PROBLEMS (unusual diarrhea, constipation, pain near the anus) TENDERNESS IN MOUTH AND THROAT WITH OR WITHOUT PRESENCE OF ULCERS (sore throat, sores in mouth, or a toothache) UNUSUAL RASH, SWELLING OR PAIN  UNUSUAL VAGINAL DISCHARGE OR ITCHING   Items with * indicate a potential emergency and should be followed up as soon as possible or go to the Emergency Department if any problems should occur.  Please show the CHEMOTHERAPY ALERT CARD or IMMUNOTHERAPY ALERT CARD at check-in to the Emergency Department and triage nurse.  Should you have questions after your visit or need to cancel or reschedule your appointment, please contact [facility] [phone]  and follow the prompts.  Office hours are 8:00 a.m. to 4:30 p.m. Monday - Friday. Please note that voicemails left after 4:00 p.m. may not be returned until the following business day.  We are closed weekends and major holidays. You have access to a nurse at all times for urgent questions. Please call the main number to the clinic [phone] and follow the prompts.  For any non-urgent questions, you may also contact your provider using MyChart. We now offer e-Visits for anyone 23 and older to request care  online for non-urgent symptoms. For details visit [email or web address].   Also download the MyChart app! Go to the app store, search "MyChart", open the app, select [facility], and log in with your MyChart username and password.

## 2023-01-09 NOTE — Patient Instructions (Signed)
[facility] at [facility] Discharge Instructions   You were seen and examined today by Dr. Katragadda.  He reviewed the results of your lab work which are normal/stable.   We will proceed with your treatment today.  Return as scheduled.    Thank you for choosing [facility] at [facility] to provide your oncology and hematology care.  To afford each patient quality time with our provider, please arrive at least 15 minutes before your scheduled appointment time.   If you have a lab appointment with the Cancer Center please come in thru the Main Entrance and check in at the main information desk.  You need to re-schedule your appointment should you arrive 10 or more minutes late.  We strive to give you quality time with our providers, and arriving late affects you and other patients whose appointments are after yours.  Also, if you no show three or more times for appointments you may be dismissed from the clinic at the providers discretion.     Again, thank you for choosing [facility].  Our hope is that these requests will decrease the amount of time that you wait before being seen by our physicians.       _____________________________________________________________  Should you have questions after your visit to [facility], please contact our office at [phone] and follow the prompts.  Our office hours are 8:00 a.m. and 4:30 p.m. Monday - Friday.  Please note that voicemails left after 4:00 p.m. may not be returned until the following business day.  We are closed weekends and major holidays.  You do have access to a nurse 24-7, just call the main number to the clinic [phone] and do not press any options, hold on the line and a nurse will answer the phone.    For prescription refill requests, have your pharmacy contact our office and allow 72 hours.    Due to Covid, you will need to wear a mask upon entering  the hospital. If you do not have a mask, a mask will be given to you at the Main Entrance upon arrival. For doctor visits, patients may have 1 support person age 18 or older with them. For treatment visits, patients can not have anyone with them due to social distancing guidelines and our immunocompromised population.      

## 2023-01-09 NOTE — Progress Notes (Signed)
MD confirmed full dose etoposide today.  T.O. Dr Carilyn Goodpasture, PharmD

## 2023-01-10 ENCOUNTER — Inpatient Hospital Stay: Payer: 59

## 2023-01-10 VITALS — BP 97/58 | HR 96 | Temp 96.3°F | Resp 18

## 2023-01-10 DIAGNOSIS — C3491 Malignant neoplasm of unspecified part of right bronchus or lung: Secondary | ICD-10-CM

## 2023-01-10 DIAGNOSIS — Z5112 Encounter for antineoplastic immunotherapy: Secondary | ICD-10-CM | POA: Diagnosis not present

## 2023-01-10 DIAGNOSIS — Z95828 Presence of other vascular implants and grafts: Secondary | ICD-10-CM

## 2023-01-10 MED ORDER — HEPARIN SOD (PORK) LOCK FLUSH 100 UNIT/ML IV SOLN
500.0000 [IU] | Freq: Once | INTRAVENOUS | Status: AC | PRN
  Administered 2023-01-10: 500 [IU]

## 2023-01-10 MED ORDER — SODIUM CHLORIDE 0.9% FLUSH
10.0000 mL | INTRAVENOUS | Status: DC | PRN
  Administered 2023-01-10: 10 mL

## 2023-01-10 MED ORDER — SODIUM CHLORIDE 0.9 % IV SOLN
100.0000 mg/m2 | Freq: Once | INTRAVENOUS | Status: AC
  Administered 2023-01-10: 174 mg via INTRAVENOUS
  Filled 2023-01-10: qty 8.7

## 2023-01-10 MED ORDER — SODIUM CHLORIDE 0.9 % IV SOLN: Freq: Once | INTRAVENOUS | Status: AC

## 2023-01-10 MED ORDER — SODIUM CHLORIDE 0.9 % IV SOLN
10.0000 mg | Freq: Once | INTRAVENOUS | Status: AC
  Administered 2023-01-10: 10 mg via INTRAVENOUS
  Filled 2023-01-10: qty 10

## 2023-01-10 NOTE — Progress Notes (Signed)
Patient presents today for chemotherapy infusion Etoposide day 2.  Patient is in satisfactory condition with no new complaints voiced.  Vital signs are stable.  Labs reviewed and all labs are within treatment parameters.  We will proceed with treatment per MD orders.    Patient tolerated treatment well with no complaints voiced.  Patient left via wheelchair n stable condition with family.  Vital signs stable at discharge.  Follow up as scheduled.

## 2023-01-10 NOTE — Patient Instructions (Signed)
[facility] [place]  Discharge Instructions: Thank you for choosing [facility] to provide your oncology and hematology care.  If you have a lab appointment with the Cancer Center - please note that after April 8th, 2024, all labs will be drawn in the cancer center.  You do not have to check in or register with the main entrance as you have in the past but will complete your check-in in the cancer center.  Wear comfortable clothing and clothing appropriate for easy access to any Portacath or PICC line.   We strive to give you quality time with your provider. You may need to reschedule your appointment if you arrive late (15 or more minutes).  Arriving late affects you and other patients whose appointments are after yours.  Also, if you miss three or more appointments without notifying the office, you may be dismissed from the clinic at the provider's discretion.      For prescription refill requests, have your pharmacy contact our office and allow 72 hours for refills to be completed.    Today you received the following chemotherapy and/or immunotherapy agents Etoposide.  Etoposide Injection What is this medication? ETOPOSIDE (e toe POE side) treats some types of cancer. It works by slowing down the growth of cancer cells. This medicine may be used for other purposes; ask your health care provider or pharmacist if you have questions. COMMON BRAND NAME(S): Etopophos, Toposar, VePesid What should I tell my care team before I take this medication? They need to know if you have any of these conditions: Infection Kidney disease Liver disease Low blood counts, such as low white cell, platelet, red cell counts An unusual or allergic reaction to etoposide, other medications, foods, dyes, or preservatives If you or your partner are pregnant or trying to get pregnant Breastfeeding How should I use this medication? This medication is injected into a vein. It is given by  your care team in a hospital or clinic setting. Talk to your care team about the use of this medication in children. Special care may be needed. Overdosage: If you think you have taken too much of this medicine contact a poison control center or emergency room at once. NOTE: This medicine is only for you. Do not share this medicine with others. What if I miss a dose? Keep appointments for follow-up doses. It is important not to miss your dose. Call your care team if you are unable to keep an appointment. What may interact with this medication? Warfarin This list may not describe all possible interactions. Give your health care provider a list of all the medicines, herbs, non-prescription drugs, or dietary supplements you use. Also tell them if you smoke, drink alcohol, or use illegal drugs. Some items may interact with your medicine. What should I watch for while using this medication? Your condition will be monitored carefully while you are receiving this medication. This medication may make you feel generally unwell. This is not uncommon as chemotherapy can affect healthy cells as well as cancer cells. Report any side effects. Continue your course of treatment even though you feel ill unless your care team tells you to stop. This medication can cause serious side effects. To reduce the risk, your care team may give you other medications to take before receiving this one. Be sure to follow the directions from your care team. This medication may increase your risk of getting an infection. Call your care team for advice if you get   a fever, chills, sore throat, or other symptoms of a cold or flu. Do not treat yourself. Try to avoid being around people who are sick. This medication may increase your risk to bruise or bleed. Call your care team if you notice any unusual bleeding. Talk to your care team about your risk of cancer. You may be more at risk for certain types of cancers if you take this  medication. Talk to your care team if you may be pregnant. Serious birth defects can occur if you take this medication during pregnancy and for 6 months after the last dose. You will need a negative pregnancy test before starting this medication. Contraception is recommended while taking this medication and for 6 months after the last dose. Your care team can help you find the option that works for you. If your partner can get pregnant, use a condom during sex while taking this medication and for 4 months after the last dose. Do not breastfeed while taking this medication. This medication may cause infertility. Talk to your care team if you are concerned about your fertility. What side effects may I notice from receiving this medication? Side effects that you should report to your care team as soon as possible: Allergic reactions--skin rash, itching, hives, swelling of the face, lips, tongue, or throat Infection--fever, chills, cough, sore throat, wounds that don't heal, pain or trouble when passing urine, general feeling of discomfort or being unwell Low red blood cell level--unusual weakness or fatigue, dizziness, headache, trouble breathing Unusual bruising or bleeding Side effects that usually do not require medical attention (report to your care team if they continue or are bothersome): Diarrhea Fatigue Hair loss Loss of appetite Nausea Vomiting This list may not describe all possible side effects. Call your doctor for medical advice about side effects. You may report side effects to FDA at 1-800-FDA-1088. Where should I keep my medication? This medication is given in a hospital or clinic. It will not be stored at home. NOTE: This sheet is a summary. It may not cover all possible information. If you have questions about this medicine, talk to your doctor, pharmacist, or health care provider.  2024 Elsevier/Gold Standard (2021-12-09 00:00:00)        To help prevent nausea and vomiting  after your treatment, we encourage you to take your nausea medication as directed.  BELOW ARE SYMPTOMS THAT SHOULD BE REPORTED IMMEDIATELY: *FEVER GREATER THAN 100.4 F (38 C) OR HIGHER *CHILLS OR SWEATING *NAUSEA AND VOMITING THAT IS NOT CONTROLLED WITH YOUR NAUSEA MEDICATION *UNUSUAL SHORTNESS OF BREATH *UNUSUAL BRUISING OR BLEEDING *URINARY PROBLEMS (pain or burning when urinating, or frequent urination) *BOWEL PROBLEMS (unusual diarrhea, constipation, pain near the anus) TENDERNESS IN MOUTH AND THROAT WITH OR WITHOUT PRESENCE OF ULCERS (sore throat, sores in mouth, or a toothache) UNUSUAL RASH, SWELLING OR PAIN  UNUSUAL VAGINAL DISCHARGE OR ITCHING   Items with * indicate a potential emergency and should be followed up as soon as possible or go to the Emergency Department if any problems should occur.  Please show the CHEMOTHERAPY ALERT CARD or IMMUNOTHERAPY ALERT CARD at check-in to the Emergency Department and triage nurse.  Should you have questions after your visit or need to cancel or reschedule your appointment, please contact [facility] [phone]  and follow the prompts.  Office hours are 8:00 a.m. to 4:30 p.m. Monday - Friday. Please note that voicemails left after 4:00 p.m. may not be returned until the following business day.    We are closed weekends and major holidays. You have access to a nurse at all times for urgent questions. Please call the main number to the clinic [phone] and follow the prompts.  For any non-urgent questions, you may also contact your provider using MyChart. We now offer e-Visits for anyone 18 and older to request care online for non-urgent symptoms. For details visit [email or web address].   Also download the MyChart app! Go to the app store, search "MyChart", open the app, select [facility], and log in with your MyChart username and password.   

## 2023-01-11 ENCOUNTER — Inpatient Hospital Stay: Payer: 59

## 2023-01-11 VITALS — BP 131/88 | HR 96 | Temp 97.3°F | Resp 20

## 2023-01-11 DIAGNOSIS — Z5112 Encounter for antineoplastic immunotherapy: Secondary | ICD-10-CM | POA: Diagnosis not present

## 2023-01-11 DIAGNOSIS — C3491 Malignant neoplasm of unspecified part of right bronchus or lung: Secondary | ICD-10-CM

## 2023-01-11 DIAGNOSIS — Z95828 Presence of other vascular implants and grafts: Secondary | ICD-10-CM

## 2023-01-11 MED ORDER — HEPARIN SOD (PORK) LOCK FLUSH 100 UNIT/ML IV SOLN
500.0000 [IU] | Freq: Once | INTRAVENOUS | Status: AC | PRN
  Administered 2023-01-11: 500 [IU]

## 2023-01-11 MED ORDER — SODIUM CHLORIDE 0.9 % IV SOLN: Freq: Once | INTRAVENOUS | Status: AC

## 2023-01-11 MED ORDER — SODIUM CHLORIDE 0.9 % IV SOLN
100.0000 mg/m2 | Freq: Once | INTRAVENOUS | Status: AC
  Administered 2023-01-11: 174 mg via INTRAVENOUS
  Filled 2023-01-11: qty 8.7

## 2023-01-11 MED ORDER — SODIUM CHLORIDE 0.9% FLUSH
10.0000 mL | INTRAVENOUS | Status: DC | PRN
  Administered 2023-01-11: 10 mL

## 2023-01-11 MED ORDER — SODIUM CHLORIDE 0.9 % IV SOLN
10.0000 mg | Freq: Once | INTRAVENOUS | Status: AC
  Administered 2023-01-11: 10 mg via INTRAVENOUS
  Filled 2023-01-11: qty 10

## 2023-01-11 NOTE — Patient Instructions (Signed)
[facility]  Discharge Instructions: Thank you for choosing [facility] to provide your oncology and hematology care.  If you have a lab appointment with the Cancer Center - please note that after April 8th, 2024, all labs will be drawn in the cancer center.  You do not have to check in or register with the main entrance as you have in the past but will complete your check-in in the cancer center.  Wear comfortable clothing and clothing appropriate for easy access to any Portacath or PICC line.   We strive to give you quality time with your provider. You may need to reschedule your appointment if you arrive late (15 or more minutes).  Arriving late affects you and other patients whose appointments are after yours.  Also, if you miss three or more appointments without notifying the office, you may be dismissed from the clinic at the provider's discretion.      For prescription refill requests, have your pharmacy contact our office and allow 72 hours for refills to be completed.    Today you received the following chemotherapy and/or immunotherapy agents etoposide   To help prevent nausea and vomiting after your treatment, we encourage you to take your nausea medication as directed.  BELOW ARE SYMPTOMS THAT SHOULD BE REPORTED IMMEDIATELY: *FEVER GREATER THAN 100.4 F (38 C) OR HIGHER *CHILLS OR SWEATING *NAUSEA AND VOMITING THAT IS NOT CONTROLLED WITH YOUR NAUSEA MEDICATION *UNUSUAL SHORTNESS OF BREATH *UNUSUAL BRUISING OR BLEEDING *URINARY PROBLEMS (pain or burning when urinating, or frequent urination) *BOWEL PROBLEMS (unusual diarrhea, constipation, pain near the anus) TENDERNESS IN MOUTH AND THROAT WITH OR WITHOUT PRESENCE OF ULCERS (sore throat, sores in mouth, or a toothache) UNUSUAL RASH, SWELLING OR PAIN  UNUSUAL VAGINAL DISCHARGE OR ITCHING   Items with * indicate a potential emergency and should be followed up as soon as possible or go to the  Emergency Department if any problems should occur.  Please show the CHEMOTHERAPY ALERT CARD or IMMUNOTHERAPY ALERT CARD at check-in to the Emergency Department and triage nurse.  Should you have questions after your visit or need to cancel or reschedule your appointment, please contact [facility] [phone]  and follow the prompts.  Office hours are 8:00 a.m. to 4:30 p.m. Monday - Friday. Please note that voicemails left after 4:00 p.m. may not be returned until the following business day.  We are closed weekends and major holidays. You have access to a nurse at all times for urgent questions. Please call the main number to the clinic [phone] and follow the prompts.  For any non-urgent questions, you may also contact your provider using MyChart. We now offer e-Visits for anyone 18 and older to request care online for non-urgent symptoms. For details visit [email or web address].   Also download the MyChart app! Go to the app store, search "MyChart", open the app, select [facility], and log in with your MyChart username and password.   

## 2023-01-11 NOTE — Progress Notes (Signed)
Patient tolerated chemotherapy with no complaints voiced.  Side effects with management reviewed with understanding verbalized.  Port site clean and dry with no bruising or swelling noted at site.  Good blood return noted before and after administration of chemotherapy.  Band aid applied.  Patient left in satisfactory condition with VSS and no s/s of distress noted.   

## 2023-01-12 ENCOUNTER — Ambulatory Visit: Payer: 59 | Admitting: Family

## 2023-01-12 ENCOUNTER — Other Ambulatory Visit: Payer: Self-pay

## 2023-01-12 ENCOUNTER — Other Ambulatory Visit: Payer: 59 | Admitting: *Deleted

## 2023-01-12 ENCOUNTER — Encounter: Payer: Self-pay | Admitting: Family

## 2023-01-12 ENCOUNTER — Encounter: Payer: 59 | Admitting: Family

## 2023-01-12 VITALS — BP 106/67 | HR 110 | Temp 97.6°F | Ht 72.0 in | Wt 127.8 lb

## 2023-01-12 DIAGNOSIS — G4700 Insomnia, unspecified: Secondary | ICD-10-CM

## 2023-01-12 DIAGNOSIS — Z09 Encounter for follow-up examination after completed treatment for conditions other than malignant neoplasm: Secondary | ICD-10-CM

## 2023-01-12 DIAGNOSIS — C3491 Malignant neoplasm of unspecified part of right bronchus or lung: Secondary | ICD-10-CM | POA: Diagnosis not present

## 2023-01-12 DIAGNOSIS — J9621 Acute and chronic respiratory failure with hypoxia: Secondary | ICD-10-CM

## 2023-01-12 DIAGNOSIS — F411 Generalized anxiety disorder: Secondary | ICD-10-CM

## 2023-01-12 DIAGNOSIS — R636 Underweight: Secondary | ICD-10-CM

## 2023-01-12 DIAGNOSIS — F17200 Nicotine dependence, unspecified, uncomplicated: Secondary | ICD-10-CM

## 2023-01-12 DIAGNOSIS — J301 Allergic rhinitis due to pollen: Secondary | ICD-10-CM

## 2023-01-12 DIAGNOSIS — J439 Emphysema, unspecified: Secondary | ICD-10-CM | POA: Diagnosis not present

## 2023-01-12 DIAGNOSIS — E441 Mild protein-calorie malnutrition: Secondary | ICD-10-CM

## 2023-01-12 MED ORDER — CETIRIZINE HCL 10 MG PO TABS
10.0000 mg | ORAL_TABLET | Freq: Every day | ORAL | 1 refills | Status: DC
Start: 2023-01-12 — End: 2023-02-02

## 2023-01-12 MED ORDER — FLUTICASONE PROPIONATE 50 MCG/ACT NA SUSP
2.0000 | Freq: Every day | NASAL | 6 refills | Status: DC
Start: 2023-01-12 — End: 2023-02-02

## 2023-01-12 MED ORDER — ENSURE MAX PROTEIN PO LIQD: 330.0000 mL | Freq: Three times a day (TID) | ORAL | 2 refills | Status: DC

## 2023-01-12 MED ORDER — ALPRAZOLAM 0.25 MG PO TABS
0.2500 mg | ORAL_TABLET | Freq: Every evening | ORAL | 2 refills | Status: DC | PRN
Start: 2023-01-12 — End: 2023-02-02

## 2023-01-12 NOTE — Patient Outreach (Signed)
Care Coordination  01/12/2023  HAIDAR MUSE 17-Jun-1961 [id number]  Successful telephone outreach with Mr. Gosch. However, he is unable to keep this telephone appointment due to having another provider appointment and request to reschedule. A new appointment was scheduled on 01/17/23 at 2:30 pm. Patient agreed to new date and time.   Estanislado Emms RN, BSN [place]  Managed [facility] RN Care Coordinator [phone]

## 2023-01-12 NOTE — Patient Instructions (Signed)
Insomnia Insomnia is a sleep disorder that makes it difficult to fall asleep or stay asleep. Insomnia can cause fatigue, low energy, difficulty concentrating, mood swings, and poor performance at work or school. There are three different ways to classify insomnia: Difficulty falling asleep. Difficulty staying asleep. Waking up too early in the morning. Any type of insomnia can be long-term (chronic) or short-term (acute). Both are common. Short-term insomnia usually lasts for 3 months or less. Chronic insomnia occurs at least three times a week for longer than 3 months. What are the causes? Insomnia may be caused by another condition, situation, or substance, such as: Having certain mental health conditions, such as anxiety and depression. Using caffeine, alcohol, tobacco, or drugs. Having gastrointestinal conditions, such as gastroesophageal reflux disease (GERD). Having certain medical conditions. These include: Asthma. Alzheimer's disease. Stroke. Chronic pain. An overactive thyroid gland (hyperthyroidism). Other sleep disorders, such as restless legs syndrome and sleep apnea. Menopause. Sometimes, the cause of insomnia may not be known. What increases the risk? Risk factors for insomnia include: Gender. Females are affected more often than males. Age. Insomnia is more common as people get older. Stress and certain medical and mental health conditions. Lack of exercise. Having an irregular work schedule. This may include working night shifts and traveling between different time zones. What are the signs or symptoms? If you have insomnia, the main symptom is having trouble falling asleep or having trouble staying asleep. This may lead to other symptoms, such as: Feeling tired or having low energy. Feeling nervous about going to sleep. Not feeling rested in the morning. Having trouble concentrating. Feeling irritable, anxious, or depressed. How is this diagnosed? This condition  may be diagnosed based on: Your symptoms and medical history. Your health care provider may ask about: Your sleep habits. Any medical conditions you have. Your mental health. A physical exam. How is this treated? Treatment for insomnia depends on the cause. Treatment may focus on treating an underlying condition that is causing the insomnia. Treatment may also include: Medicines to help you sleep. Counseling or therapy. Lifestyle adjustments to help you sleep better. Follow these instructions at home: Eating and drinking  Limit or avoid alcohol, caffeinated beverages, and products that contain nicotine and tobacco, especially close to bedtime. These can disrupt your sleep. Do not eat a large meal or eat spicy foods right before bedtime. This can lead to digestive discomfort that can make it hard for you to sleep. Sleep habits  Keep a sleep diary to help you and your health care provider figure out what could be causing your insomnia. Write down: When you sleep. When you wake up during the night. How well you sleep and how rested you feel the next day. Any side effects of medicines you are taking. What you eat and drink. Make your bedroom a dark, comfortable place where it is easy to fall asleep. Put up shades or blackout curtains to block light from outside. Use a white noise machine to block noise. Keep the temperature cool. Limit screen use before bedtime. This includes: Not watching TV. Not using your smartphone, tablet, or computer. Stick to a routine that includes going to bed and waking up at the same times every day and night. This can help you fall asleep faster. Consider making a quiet activity, such as reading, part of your nighttime routine. Try to avoid taking naps during the day so that you sleep better at night. Get out of bed if you are still awake after   15 minutes of trying to sleep. Keep the lights down, but try reading or doing a quiet activity. When you feel  sleepy, go back to bed. General instructions Take over-the-counter and prescription medicines only as told by your health care provider. Exercise regularly as told by your health care provider. However, avoid exercising in the hours right before bedtime. Use relaxation techniques to manage stress. Ask your health care provider to suggest some techniques that may work well for you. These may include: Breathing exercises. Routines to release muscle tension. Visualizing peaceful scenes. Make sure that you drive carefully. Do not drive if you feel very sleepy. Keep all follow-up visits. This is important. Contact a health care provider if: You are tired throughout the day. You have trouble in your daily routine due to sleepiness. You continue to have sleep problems, or your sleep problems get worse. Get help right away if: You have thoughts about hurting yourself or someone else. Get help right away if you feel like you may hurt yourself or others, or have thoughts about taking your own life. Go to your nearest emergency room or: Call 911. Call the National Suicide Prevention Lifeline at 1-800-273-8255 or 988. This is open 24 hours a day. Text the Crisis Text Line at 741741. Summary Insomnia is a sleep disorder that makes it difficult to fall asleep or stay asleep. Insomnia can be long-term (chronic) or short-term (acute). Treatment for insomnia depends on the cause. Treatment may focus on treating an underlying condition that is causing the insomnia. Keep a sleep diary to help you and your health care provider figure out what could be causing your insomnia. This information is not intended to replace advice given to you by your health care provider. Make sure you discuss any questions you have with your health care provider. Document Revised: 06/28/2021 Document Reviewed: 06/28/2021 Elsevier Patient Education  2024 Elsevier Inc.  

## 2023-01-12 NOTE — Progress Notes (Signed)
Subjective:    Patient ID: Bradley Rice, male    DOB: 1961/06/22, 62 y.o.   MRN: [id number]  Chief Complaint  Patient presents with   Hospitalization Follow-up   PT presents to the office today for hospital follow up. He went to the ED on 12/23/22 and 12/29/22 for SOB and diagnosed with pneumothorax. He has IBV placed 036/03/24. He has small cell lung cancer and currently getting chemo.   He continues to have SOB. He is on 6L of continuous O2. He has COPD. He smokes about a pack a week.   He has lost 16 lbs since our last visit on 11/18/22. He is drinking Ensure TID.     01/12/2023    2:23 PM 01/09/2023   11:05 AM 01/09/2023   10:30 AM  Last 3 Weights  Weight (lbs) 127 lb 12.8 oz 126 lb 131 lb  Weight (kg) 57.97 kg 57.153 kg 59.421 kg    Wife is currently full time caregiver. She takes him to all of his chemo treatments 3-4 times a weeks. Needs FMLA paperwork completed.  Insomnia Primary symptoms: difficulty falling asleep, somnolence, frequent awakening.   The current episode started more than one year.  Anxiety Presents for follow-up visit. Symptoms include depressed mood, excessive worry, insomnia, nervous/anxious behavior and restlessness. Symptoms occur constantly. The severity of symptoms is moderate.        Review of Systems  Psychiatric/Behavioral:  The patient is nervous/anxious and has insomnia.   All other systems reviewed and are negative.      Objective:   Physical Exam Vitals reviewed.  Constitutional:      General: He is not in acute distress.    Appearance: He is underweight.  HENT:     Head: Normocephalic.     Right Ear: Tympanic membrane normal.     Left Ear: Tympanic membrane normal.  Eyes:     General:        Right eye: No discharge.        Left eye: No discharge.     Pupils: Pupils are equal, round, and reactive to light.  Neck:     Thyroid: No thyromegaly.  Cardiovascular:     Rate and Rhythm: Normal rate and regular rhythm.     Heart  sounds: Normal heart sounds. No murmur heard. Pulmonary:     Effort: Pulmonary effort is normal. No respiratory distress.     Breath sounds: Wheezing present.     Comments: Dry nonproductive cough Abdominal:     General: Bowel sounds are normal. There is no distension.     Palpations: Abdomen is soft.     Tenderness: There is no abdominal tenderness.  Musculoskeletal:        General: No tenderness. Normal range of motion.     Cervical back: Normal range of motion and neck supple.  Skin:    General: Skin is warm and dry.     Findings: No erythema or rash.  Neurological:     Mental Status: He is alert and oriented to person, place, and time.     Cranial Nerves: No cranial nerve deficit.     Deep Tendon Reflexes: Reflexes are normal and symmetric.  Psychiatric:        Behavior: Behavior normal.        Thought Content: Thought content normal.        Judgment: Judgment normal.     BP 106/67   Pulse (!) 110   Temp 97.6 F (36.4  C) (Temporal)   Ht 6' (1.829 m)   Wt 127 lb 12.8 oz (58 kg)   SpO2 92% Comment: on 6 litter  BMI 17.33 kg/m        Assessment & Plan:  Bradley Rice comes in today with chief complaint of Hospitalization Follow-up   Diagnosis and orders addressed:  1. Endobronchial cancer, right (HCC)  2. Pulmonary emphysema, unspecified emphysema type (HCC)  3. Acute on chronic respiratory failure with hypoxia (HCC)  4. GAD (generalized anxiety disorder) - ALPRAZolam (XANAX) 0.25 MG tablet; Take 1 tablet (0.25 mg total) by mouth at bedtime as needed for anxiety.  Dispense: 30 tablet; Refill: 2  5. Current smoker  6. Mild protein-calorie malnutrition (HCC) - Ensure Max Protein (ENSURE MAX PROTEIN) LIQD; Take 330 mLs by mouth 3 (three) times daily.  Dispense: 29700 mL; Refill: 2  7. Underweight due to inadequate caloric intake - Ensure Max Protein (ENSURE MAX PROTEIN) LIQD; Take 330 mLs by mouth 3 (three) times daily.  Dispense: 29700 mL; Refill: 2  8.  Insomnia, unspecified type - ALPRAZolam (XANAX) 0.25 MG tablet; Take 1 tablet (0.25 mg total) by mouth at bedtime as needed for anxiety.  Dispense: 30 tablet; Refill: 2  9. Allergic rhinitis due to pollen, unspecified seasonality - cetirizine (ZYRTEC ALLERGY) 10 MG tablet; Take 1 tablet (10 mg total) by mouth daily.  Dispense: 90 tablet; Refill: 1 - fluticasone (FLONASE) 50 MCG/ACT nasal spray; Place 2 sprays into both nostrils daily.  Dispense: 16 g; Refill: 6  10. Hospital discharge follow-up   Labs pending Ensure TID prn with meals, needs to increase calories  Start xanax 0.25 mg as needed Sleep ritual  Start zyrtec and flonase  Health Maintenance reviewed Diet and exercise encouraged  Follow up plan: 3 months    [name], FNP

## 2023-01-12 NOTE — Progress Notes (Signed)
The proposed treatment discussed in conference is for discussion purpose only and is not a binding recommendation.  The patients have not been physically examined, or presented with their treatment options.  Therefore, final treatment plans cannot be decided.  

## 2023-01-13 ENCOUNTER — Inpatient Hospital Stay: Payer: 59

## 2023-01-13 ENCOUNTER — Other Ambulatory Visit: Payer: Self-pay

## 2023-01-13 ENCOUNTER — Ambulatory Visit: Payer: 59

## 2023-01-17 ENCOUNTER — Other Ambulatory Visit: Payer: 59 | Admitting: *Deleted

## 2023-01-17 ENCOUNTER — Encounter: Payer: Self-pay | Admitting: *Deleted

## 2023-01-17 NOTE — Patient Outreach (Signed)
Medicaid Managed Care   Nurse Care Manager Note  01/17/2023 Name:  Bradley Rice MRN:  [id number] DOB:  May 25, 1961  Bradley Rice is an 62 y.o. year old male who is a primary patient of Bradley Spencer, FNP.  The [facility] Managed Care Coordination team was consulted for assistance with:    Small Cell Lung Cancer  Bradley Rice was given information about Medicaid Managed Care Coordination team services today. Bradley Rice Patient agreed to services and verbal consent obtained.  Engaged with patient by telephone for initial visit in response to provider referral for case management and/or care coordination services.   Assessments/Interventions:  Review of past medical history, allergies, medications, health status, including review of consultants reports, laboratory and other test data, was performed as part of comprehensive evaluation and provision of chronic care management services.  SDOH (Social Determinants of Health) assessments and interventions performed: SDOH Interventions    Flowsheet Row Patient Outreach Telephone from 01/17/2023 in [place] [facility] Office Visit from 01/12/2023 in [facility] Clinical Support from 01/05/2023 in [facility] Office Visit from 09/01/2022 in [facility] Office Visit from 02/10/2022 in [facility] Video Visit from 12/18/2020 in [facility]  SDOH Interventions        Transportation Interventions Intervention Not Indicated -- -- -- -- --  Depression Interventions/Treatment  -- Currently on Treatment -- Currently on Treatment Currently on Treatment Currently on Treatment, Patient refuses Treatment  Financial Strain Interventions Other (Comment)  [provided [email or web address] to apply for food benefits, advised patient to apply for disability] -- Artist -- -- --       Care  Plan  Allergies  Allergen Reactions   Pirfenidone Nausea And Vomiting and Other (See Comments)    Weight loss    Medications Reviewed Today     Reviewed by Bradley Dach, RN (Registered Nurse) on 01/17/23 at 1457  Med List Status: <None>   Medication Order Taking? Sig Documenting Provider Last Dose Status Informant  albuterol (PROVENTIL) (2.5 MG/3ML) 0.083% nebulizer solution [id number] Yes Take 3 mLs (2.5 mg total) by nebulization every 6 (six) hours as needed for wheezing or shortness of breath. Bradley Bayley, NP Taking Active Self  albuterol (VENTOLIN HFA) 108 (90 Base) MCG/ACT inhaler [id number] Yes Inhale 2 puffs into the lungs every 6 (six) hours as needed for wheezing or shortness of breath. Bradley Bayley, NP Taking Active Self  ALPRAZolam Prudy Feeler) 0.25 MG tablet [id number] Yes Take 1 tablet (0.25 mg total) by mouth at bedtime as needed for anxiety. Bradley Spencer, FNP Taking Active   benzonatate (TESSALON) 200 MG capsule [id number] Yes Take 1 capsule (200 mg total) by mouth 3 (three) times daily as needed. Bradley Massed, MD Taking Active   blood glucose meter kit and supplies [id number] No Dispense based on patient and insurance preference. Use up to four times daily as directed. (FOR ICD-10 E10.9, E11.9).  Patient not taking: Reported on 01/17/2023   Bradley Spencer, FNP Not Taking Active Self  busPIRone (BUSPAR) 5 MG tablet [id number] Yes Take 1 tablet (5 mg total) by mouth 3 (three) times daily. Bradley Spencer, FNP Taking Active Self  CARBOPLATIN IV [id number] Yes Inject into the vein every 21 ( twenty-one) days. [provider] Taking Active Self           Med Note (Rice, Bradley E  Thu Dec 29, 2022  8:35 PM) Patient not sure of dose, he states he did take today  cetirizine (ZYRTEC ALLERGY) 10 MG tablet [id number] Yes Take 1 tablet (10 mg total) by mouth daily. Bradley Spencer, FNP Taking Active   docusate sodium (COLACE) 100 MG capsule [id number]  Yes Take 100 mg by mouth 2 (two) times daily. [provider] Taking Active   DULoxetine (CYMBALTA) 60 MG capsule [id number] Yes Take 1 capsule (60 mg total) by mouth daily. Bradley Spencer, FNP Taking Active Self  DURVALUMAB IV [id number] Yes Inject into the vein every 21 ( twenty-one) days. [provider] Taking Active Self           Med Note (Rice, Bradley Berger Dec 29, 2022  8:35 PM) Patient not sure of dose. He states he did take today   Ensure Max Protein (ENSURE MAX PROTEIN) LIQD [id number] Yes Take 330 mLs by mouth 3 (three) times daily. Bradley Spencer, FNP Taking Active            Med Note Bradley Rice, Bradley Gillentine A   Tue Jan 17, 2023  2:48 PM) Drinking 2-3 daily  ETOPOSIDE IV [id number] Yes Inject into the vein every 21 ( twenty-one) days. [provider] Taking Active Self           Med Note (Rice, Bradley Berger Dec 29, 2022  8:35 PM) Patient not sure of dose. He states he did take today   famotidine (PEPCID) 20 MG tablet [id number] Yes Take 1 tablet (20 mg total) by mouth 2 (two) times daily. Bradley Massed, MD Taking Active Self  fentaNYL (DURAGESIC) 25 MCG/HR [id number] Yes Place 1 patch onto the skin every 3 (three) days. [provider] Taking Active   fexofenadine (ALLEGRA) 180 MG tablet [id number] No Take 1 tablet (180 mg total) by mouth daily.  Patient not taking: Reported on 01/17/2023   Bradley Spencer, FNP Not Taking Active Self  fluticasone (FLONASE) 50 MCG/ACT nasal spray [id number] No Place 2 sprays into both nostrils daily.  Patient not taking: Reported on 01/17/2023   Bradley Spencer, FNP Not Taking Active   ibuprofen (ADVIL) 800 MG tablet [id number] Yes Take 1 tablet (800 mg total) by mouth every 8 (eight) hours as needed. Bradley Spencer, FNP Taking Active Self  lidocaine-prilocaine (EMLA) cream [id number] Yes Apply a small amount to port a cath site and cover with plastic wrap 1 hour prior to infusion appointments  Bradley Massed, MD Taking Active Self  magic mouthwash w/lidocaine SOLN [id number] No Take 10 mLs by mouth 3 (three) times daily as needed for mouth pain.  Patient not taking: Reported on 01/17/2023   Bradley Spencer, FNP Not Taking Active Self           Med Note (Rice, Bradley Berger Dec 29, 2022  8:38 PM)    megestrol (MEGACE) 400 MG/10ML suspension [id number] Yes Take 10 mLs (400 mg total) by mouth 2 (two) times daily. Bradley Massed, MD Taking Active Self  metoprolol tartrate (LOPRESSOR) 25 MG tablet [id number] Yes Take 0.5 tablets (12.5 mg total) by mouth 2 (two) times daily. Burnadette Pop, MD Taking Active   Misc. Devices MISC [id number] Yes Patient require 6L Nasal Cannula Oxygen Bradley Massed, MD Taking Active Self  naloxone [facility]) nasal spray 4 mg/0.1 mL [id number] No Place 1 spray into the nose as needed (opioid overdose).  Patient not taking: Reported on 01/17/2023   [provider] Not Taking Active Self  Nintedanib (OFEV) 150 MG CAPS [id number] Yes Take 1 capsule (150 mg total) by mouth 2 (two) times daily. Bradley Bayley, NP Taking Active Self  nortriptyline (PAMELOR) 50 MG capsule [id number] Yes Take 50 mg by mouth 2 (two) times daily. [provider] Taking Active Self  oxyCODONE (OXYCONTIN) 20 mg 12 hr tablet [id number] Yes Take 1 tablet (20 mg total) by mouth every 12 (twelve) hours. Bradley Massed, MD Taking Active Self  oxyCODONE (ROXICODONE) 15 MG immediate release tablet [id number] Yes Take 1 tablet (15 mg total) by mouth 4 (four) times daily. Bradley Massed, MD Taking Active   prochlorperazine (COMPAZINE) 10 MG tablet [id number] Yes Take 1 tablet (10 mg total) by mouth every 6 (six) hours as needed for nausea or vomiting. Bradley Massed, MD Taking Active Self  traZODone (DESYREL) 50 MG tablet [id number] Yes Take 2 tablets (100 mg total) by mouth at bedtime.  Patient taking differently: Take 100 mg by mouth at bedtime  as needed for sleep.   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   Acute on chronic respiratory failure with hypoxia (HCC) 11/09/2022   Hypoalbuminemia due to protein-calorie malnutrition (HCC) 11/09/2022   ALS (amyotrophic lateral sclerosis) (HCC) 11/09/2022   Anxiety 11/09/2022   Pleural effusion on right 11/09/2022   Primary insomnia 09/01/2022   Fusion of spine, cervical region 03/25/2021   Emphysema lung (HCC) 12/10/2019   IPF (idiopathic pulmonary fibrosis) (HCC) 07/15/2018   Chronic back pain 04/30/2018   Chronic pain 03/15/2018   Hypertension 09/30/2016   GAD (generalized anxiety disorder) 02/17/2015   Depression 02/17/2015   Hx of smoking 02/17/2015   Postlaminectomy syndrome, cervical region 08/15/2014   Cervical dystonia 06/13/2014   Cervicogenic headache 05/09/2014   TIA (transient ischemic attack) 12/28/2013   Cervical spondylosis with radiculopathy 12/17/2013   ED (erectile dysfunction) 11/21/2013   Multilevel spondylosis 06/25/2012   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   Carpal tunnel syndrome of left wrist 06/25/2012    Class: Chronic    Conditions to be addressed/monitored per PCP order:   Small Cell Lung Cancer  Care Plan : RN Care Manager Plan of Care  Updates made by Bradley Dach, RN since 01/17/2023 12:00 AM     Problem: Health Management needs related to Cancer      Long-Range Goal: Development of Plan of Care to address needs Health Management needs related to Cancer   Start Date: 01/17/2023  Expected End Date: 04/17/2023  Note:   Current Barriers:  Chronic Disease Management support and education needs related to Small Cell Lung Cancer  RNCM Clinical Goal(s):  Patient will  verbalize understanding of plan for management of Small Cell Lung Cancer as evidenced by patient reports attend all scheduled medical appointments: 01/24/23 with BSW, 01/30/23 with Oncology, 7/2 and 7/3 for infusion as evidenced by provider documentation in EMR        continue to work with RN Care Manager and/or Social Worker to address care management and care coordination needs related to Small Cell Lung Cancer as evidenced by adherence to CM Team Scheduled appointments     through collaboration with RN Care manager, provider, and care  team.   Interventions: Inter-disciplinary care team collaboration (see longitudinal plan of care) Evaluation of current treatment plan related to  self management and patient's adherence to plan as established by provider   Oncology:  (New goal.) Long Term Goal  Assessment of understanding of oncology diagnosis:  Assessed patient understanding of cancer diagnosis and recommended treatment plan Reviewed upcoming provider appointments and treatment appointments Assessed available transportation to appointments and treatments. Has consistent/reliable transportation: Yes Assessed support system. Has consistent/reliable family or other support: Yes Collaborated with PCP for shower chair Reviewed SDOH, advised patient/spouse to apply for disability and food benefits, provided with [email or web address] Reviewed medications  Patient Goals/Self-Care Activities: Attend all scheduled provider appointments Call provider office for new concerns or questions  Work with the social worker to address care coordination needs and will continue to work with the clinical team to address health care and disease management related needs Keep a journal of new symptoms or questions to share with provider Apply for financial assistance       Follow Up:  Patient agrees to Care Plan and Follow-up.  Plan: The Managed Medicaid care management team will reach out to the patient again over  the next 30 days.  Date/time of next scheduled RN care management/care coordination outreach:  02/07/23 @ 1:15 pm  Estanislado Emms RN, BSN [place]  Managed [facility] RN Care Coordinator [phone]

## 2023-01-17 NOTE — Patient Instructions (Signed)
Visit Information  Bradley Rice was given information about Medicaid Managed Care team care coordination services as a part of their Healthy [facility] Medicaid benefit. Bradley Rice verbally consented to engagement with the [facility] Managed Care team.   If you are experiencing a medical emergency, please call 911 or report to your local emergency department or urgent care.   If you have a non-emergency medical problem during routine business hours, please contact your provider's office and ask to speak with a nurse.   For questions related to your Healthy [facility] health plan, please call: [phone] or visit the homepage here: [email or web address]  If you would like to schedule transportation through your Healthy [facility] plan, please call the following number at least 2 days in advance of your appointment: [phone]  For information about your ride after you set it up, call Ride Assist at [phone]. Use this number to activate a Will Call pickup, or if your transportation is late for a scheduled pickup. Use this number, too, if you need to make a change or cancel a previously scheduled reservation.  If you need transportation services right away, call [phone]. The after-hours call center is staffed 24 hours to handle ride assistance and urgent reservation requests (including discharges) 365 days a year. Urgent trips include sick visits, hospital discharge requests and life-sustaining treatment.  Call the [facility] Line at [phone], at any time, 24 hours a day, 7 days a week. If you are in danger or need immediate medical attention call 911.  If you would like help to quit smoking, call 1-800-QUIT-NOW ((303)010-2077) OR Espaol: 1-855-Djelo-Ya (0-630-160-1093) o para ms informacin haga clic aqu or Text READY to 235-573 to register via text  Bradley Rice,   Please see education materials related to managing cancer treatment  provided by MyChart link.  Patient verbalizes understanding of instructions and care plan provided today and agrees to view in MyChart. Active MyChart status and patient understanding of how to access instructions and care plan via MyChart confirmed with patient.     Telephone follow up appointment with Managed Medicaid care management team member scheduled for:02/07/23 @ 1:15 pm  Estanislado Emms RN, BSN Bradley Fermina  Managed [facility] RN Care Coordinator [phone]   Following is a copy of your plan of care:  Care Plan : RN Care Manager Plan of Care  Updates made by Heidi Dach, RN since 01/17/2023 12:00 AM     Problem: Health Management needs related to Cancer      Long-Range Goal: Development of Plan of Care to address needs Health Management needs related to Cancer   Start Date: 01/17/2023  Expected End Date: 04/17/2023  Note:   Current Barriers:  Chronic Disease Management support and education needs related to Small Cell Lung Cancer  RNCM Clinical Goal(s):  Patient will verbalize understanding of plan for management of Small Cell Lung Cancer as evidenced by patient reports attend all scheduled medical appointments: 01/24/23 with BSW, 01/30/23 with Oncology, 7/2 and 7/3 for infusion as evidenced by provider documentation in EMR        continue to work with RN Care Manager and/or Social Worker to address care management and care coordination needs related to Small Cell Lung Cancer as evidenced by adherence to CM Team Scheduled appointments     through collaboration with RN Care manager, provider, and care team.   Interventions: Inter-disciplinary care team collaboration (see longitudinal plan of care) Evaluation of current treatment plan related to  self management  and patient's adherence to plan as established by provider   Oncology:  (New goal.) Long Term Goal  Assessment of understanding of oncology diagnosis:  Assessed patient understanding of cancer diagnosis and recommended  treatment plan Reviewed upcoming provider appointments and treatment appointments Assessed available transportation to appointments and treatments. Has consistent/reliable transportation: Yes Assessed support system. Has consistent/reliable family or other support: Yes Collaborated with PCP for shower chair Reviewed SDOH, advised patient/spouse to apply for disability and food benefits, provided with [email or web address] Reviewed medications  Patient Goals/Self-Care Activities: Attend all scheduled provider appointments Call provider office for new concerns or questions  Work with the social worker to address care coordination needs and will continue to work with the clinical team to address health care and disease management related needs Keep a journal of new symptoms or questions to share with provider Apply for financial assistance

## 2023-01-18 ENCOUNTER — Other Ambulatory Visit: Payer: Self-pay

## 2023-01-19 ENCOUNTER — Encounter: Payer: Self-pay | Admitting: Hematology

## 2023-01-19 ENCOUNTER — Other Ambulatory Visit: Payer: Self-pay | Admitting: Family

## 2023-01-19 MED ORDER — OXYCODONE HCL ER 20 MG PO T12A: 20.0000 mg | EXTENDED_RELEASE_TABLET | Freq: Two times a day (BID) | ORAL | 0 refills | Status: DC

## 2023-01-19 NOTE — Progress Notes (Signed)
Shower chair ordered, please fax.   [name], FNP

## 2023-01-19 NOTE — Addendum Note (Signed)
Addended by: [name] A on: 01/19/2023 12:56 PM   Modules accepted: Orders

## 2023-01-24 ENCOUNTER — Other Ambulatory Visit: Payer: Self-pay

## 2023-01-24 ENCOUNTER — Inpatient Hospital Stay (HOSPITAL_COMMUNITY)
Admission: EM | Admit: 2023-01-24 | Discharge: 2023-03-02 | DRG: 163 | Disposition: E | Payer: 59 | Attending: Internal Medicine | Admitting: Internal Medicine

## 2023-01-24 ENCOUNTER — Emergency Department (HOSPITAL_COMMUNITY): Payer: 59

## 2023-01-24 ENCOUNTER — Telehealth: Payer: Self-pay | Admitting: *Deleted

## 2023-01-24 ENCOUNTER — Encounter (HOSPITAL_COMMUNITY): Payer: Self-pay

## 2023-01-24 ENCOUNTER — Other Ambulatory Visit: Payer: 59

## 2023-01-24 DIAGNOSIS — G1221 Amyotrophic lateral sclerosis: Secondary | ICD-10-CM | POA: Diagnosis present

## 2023-01-24 DIAGNOSIS — J9382 Other air leak: Secondary | ICD-10-CM | POA: Diagnosis present

## 2023-01-24 DIAGNOSIS — F32A Depression, unspecified: Secondary | ICD-10-CM | POA: Diagnosis present

## 2023-01-24 DIAGNOSIS — J4489 Other specified chronic obstructive pulmonary disease: Secondary | ICD-10-CM | POA: Diagnosis present

## 2023-01-24 DIAGNOSIS — I959 Hypotension, unspecified: Secondary | ICD-10-CM | POA: Diagnosis present

## 2023-01-24 DIAGNOSIS — Z809 Family history of malignant neoplasm, unspecified: Secondary | ICD-10-CM

## 2023-01-24 DIAGNOSIS — J939 Pneumothorax, unspecified: Secondary | ICD-10-CM | POA: Diagnosis present

## 2023-01-24 DIAGNOSIS — E43 Unspecified severe protein-calorie malnutrition: Secondary | ICD-10-CM | POA: Diagnosis present

## 2023-01-24 DIAGNOSIS — Z681 Body mass index (BMI) 19 or less, adult: Secondary | ICD-10-CM

## 2023-01-24 DIAGNOSIS — D72819 Decreased white blood cell count, unspecified: Secondary | ICD-10-CM | POA: Diagnosis present

## 2023-01-24 DIAGNOSIS — D75838 Other thrombocytosis: Secondary | ICD-10-CM | POA: Diagnosis present

## 2023-01-24 DIAGNOSIS — Z66 Do not resuscitate: Secondary | ICD-10-CM | POA: Diagnosis present

## 2023-01-24 DIAGNOSIS — E872 Acidosis, unspecified: Secondary | ICD-10-CM | POA: Diagnosis present

## 2023-01-24 DIAGNOSIS — G894 Chronic pain syndrome: Secondary | ICD-10-CM | POA: Diagnosis present

## 2023-01-24 DIAGNOSIS — Z981 Arthrodesis status: Secondary | ICD-10-CM

## 2023-01-24 DIAGNOSIS — I2510 Atherosclerotic heart disease of native coronary artery without angina pectoris: Secondary | ICD-10-CM | POA: Diagnosis present

## 2023-01-24 DIAGNOSIS — L89152 Pressure ulcer of sacral region, stage 2: Secondary | ICD-10-CM | POA: Diagnosis present

## 2023-01-24 DIAGNOSIS — I10 Essential (primary) hypertension: Secondary | ICD-10-CM | POA: Diagnosis present

## 2023-01-24 DIAGNOSIS — G893 Neoplasm related pain (acute) (chronic): Secondary | ICD-10-CM | POA: Diagnosis present

## 2023-01-24 DIAGNOSIS — Z8673 Personal history of transient ischemic attack (TIA), and cerebral infarction without residual deficits: Secondary | ICD-10-CM

## 2023-01-24 DIAGNOSIS — Z515 Encounter for palliative care: Secondary | ICD-10-CM

## 2023-01-24 DIAGNOSIS — J9383 Other pneumothorax: Secondary | ICD-10-CM | POA: Diagnosis not present

## 2023-01-24 DIAGNOSIS — Z87891 Personal history of nicotine dependence: Secondary | ICD-10-CM | POA: Diagnosis not present

## 2023-01-24 DIAGNOSIS — C3491 Malignant neoplasm of unspecified part of right bronchus or lung: Secondary | ICD-10-CM

## 2023-01-24 DIAGNOSIS — R54 Age-related physical debility: Secondary | ICD-10-CM | POA: Diagnosis present

## 2023-01-24 DIAGNOSIS — J84112 Idiopathic pulmonary fibrosis: Secondary | ICD-10-CM | POA: Diagnosis present

## 2023-01-24 DIAGNOSIS — R64 Cachexia: Secondary | ICD-10-CM | POA: Diagnosis present

## 2023-01-24 DIAGNOSIS — J9621 Acute and chronic respiratory failure with hypoxia: Secondary | ICD-10-CM | POA: Diagnosis present

## 2023-01-24 DIAGNOSIS — Z888 Allergy status to other drugs, medicaments and biological substances status: Secondary | ICD-10-CM

## 2023-01-24 DIAGNOSIS — D638 Anemia in other chronic diseases classified elsewhere: Secondary | ICD-10-CM | POA: Diagnosis present

## 2023-01-24 DIAGNOSIS — Z8489 Family history of other specified conditions: Secondary | ICD-10-CM

## 2023-01-24 DIAGNOSIS — C3431 Malignant neoplasm of lower lobe, right bronchus or lung: Secondary | ICD-10-CM | POA: Diagnosis present

## 2023-01-24 DIAGNOSIS — Z79891 Long term (current) use of opiate analgesic: Secondary | ICD-10-CM

## 2023-01-24 DIAGNOSIS — Z9861 Coronary angioplasty status: Secondary | ICD-10-CM

## 2023-01-24 DIAGNOSIS — Z79899 Other long term (current) drug therapy: Secondary | ICD-10-CM

## 2023-01-24 DIAGNOSIS — G8929 Other chronic pain: Secondary | ICD-10-CM | POA: Diagnosis present

## 2023-01-24 DIAGNOSIS — J449 Chronic obstructive pulmonary disease, unspecified: Secondary | ICD-10-CM | POA: Diagnosis not present

## 2023-01-24 DIAGNOSIS — J9311 Primary spontaneous pneumothorax: Secondary | ICD-10-CM | POA: Diagnosis not present

## 2023-01-24 DIAGNOSIS — J9312 Secondary spontaneous pneumothorax: Secondary | ICD-10-CM | POA: Diagnosis present

## 2023-01-24 DIAGNOSIS — F331 Major depressive disorder, recurrent, moderate: Secondary | ICD-10-CM | POA: Diagnosis not present

## 2023-01-24 DIAGNOSIS — Z9981 Dependence on supplemental oxygen: Secondary | ICD-10-CM

## 2023-01-24 DIAGNOSIS — Z7189 Other specified counseling: Secondary | ICD-10-CM | POA: Diagnosis not present

## 2023-01-24 LAB — CBC
HCT: 32 % — ABNORMAL LOW (ref 39.0–52.0)
Hemoglobin: 10.2 g/dL — ABNORMAL LOW (ref 13.0–17.0)
MCH: 28.5 pg (ref 26.0–34.0)
MCHC: 31.9 g/dL (ref 30.0–36.0)
MCV: 89.4 fL (ref 80.0–100.0)
Platelets: 248 10*3/uL (ref 150–400)
RBC: 3.58 MIL/uL — ABNORMAL LOW (ref 4.22–5.81)
RDW: 15 % (ref 11.5–15.5)
WBC: 1.4 10*3/uL — CL (ref 4.0–10.5)
nRBC: 0 % (ref 0.0–0.2)

## 2023-01-24 LAB — BASIC METABOLIC PANEL
Anion gap: 9 (ref 5–15)
BUN: 5 mg/dL — ABNORMAL LOW (ref 6–20)
CO2: 30 mmol/L (ref 22–32)
Calcium: 9.2 mg/dL (ref 8.9–10.3)
Chloride: 97 mmol/L — ABNORMAL LOW (ref 98–111)
Creatinine, Ser: 0.59 mg/dL — ABNORMAL LOW (ref 0.61–1.24)
GFR, Estimated: >60 mL/min (ref >60)
Glucose, Bld: 143 mg/dL — ABNORMAL HIGH (ref 70–99)
Potassium: 3.6 mmol/L (ref 3.5–5.1)
Sodium: 136 mmol/L (ref 135–145)

## 2023-01-24 MED ORDER — HYDROCODONE BIT-HOMATROP MBR 5-1.5 MG/5ML PO SOLN: 5.0000 mL | Freq: Four times a day (QID) | ORAL | 0 refills | Status: DC | PRN

## 2023-01-24 MED ORDER — ACETAMINOPHEN 325 MG PO TABS
650.0000 mg | ORAL_TABLET | Freq: Four times a day (QID) | ORAL | Status: DC | PRN
  Administered 2023-01-25 – 2023-01-27 (×4): 650 mg via ORAL
  Filled 2023-01-24 (×4): qty 2

## 2023-01-24 MED ORDER — ONDANSETRON HCL 4 MG/2ML IJ SOLN
4.0000 mg | Freq: Once | INTRAMUSCULAR | Status: AC
  Administered 2023-01-24: 4 mg via INTRAVENOUS
  Filled 2023-01-24: qty 2

## 2023-01-24 MED ORDER — HYDROMORPHONE HCL 1 MG/ML IJ SOLN
0.5000 mg | Freq: Four times a day (QID) | INTRAMUSCULAR | Status: DC | PRN
  Administered 2023-01-25: 0.5 mg via INTRAVENOUS
  Filled 2023-01-24 (×2): qty 0.5

## 2023-01-24 MED ORDER — HYDROMORPHONE HCL 1 MG/ML IJ SOLN
0.5000 mg | Freq: Once | INTRAMUSCULAR | Status: AC
  Administered 2023-01-24: 0.5 mg via INTRAVENOUS
  Filled 2023-01-24: qty 0.5

## 2023-01-24 MED ORDER — POLYETHYLENE GLYCOL 3350 17 G PO PACK: 17.0000 g | PACK | Freq: Every day | ORAL | Status: DC | PRN

## 2023-01-24 MED ORDER — ACETAMINOPHEN 650 MG RE SUPP: 650.0000 mg | Freq: Four times a day (QID) | RECTAL | Status: DC | PRN

## 2023-01-24 MED ORDER — HYDROMORPHONE HCL 1 MG/ML IJ SOLN: 1.0000 mg | Freq: Once | INTRAMUSCULAR | Status: DC

## 2023-01-24 MED ORDER — HYDROMORPHONE HCL 1 MG/ML IJ SOLN
INTRAMUSCULAR | Status: AC
  Filled 2023-01-24: qty 0.5

## 2023-01-24 MED ORDER — HYDROMORPHONE HCL 1 MG/ML IJ SOLN
1.0000 mg | Freq: Once | INTRAMUSCULAR | Status: AC
  Administered 2023-01-24: 1 mg via INTRAVENOUS
  Filled 2023-01-24: qty 1

## 2023-01-24 MED ORDER — HYDROMORPHONE HCL 1 MG/ML IJ SOLN
0.5000 mg | Freq: Once | INTRAMUSCULAR | Status: AC
  Administered 2023-01-24: 0.5 mg via INTRAVENOUS

## 2023-01-24 MED ORDER — POTASSIUM CHLORIDE 20 MEQ PO PACK
40.0000 meq | PACK | Freq: Once | ORAL | Status: AC
  Administered 2023-01-24: 40 meq via ORAL
  Filled 2023-01-24: qty 2

## 2023-01-24 MED ORDER — ALBUTEROL SULFATE (2.5 MG/3ML) 0.083% IN NEBU
2.5000 mg | INHALATION_SOLUTION | Freq: Four times a day (QID) | RESPIRATORY_TRACT | Status: DC | PRN
  Administered 2023-01-29 – 2023-01-31 (×4): 2.5 mg via RESPIRATORY_TRACT
  Filled 2023-01-24 (×7): qty 3

## 2023-01-24 MED ORDER — LIDOCAINE-EPINEPHRINE (PF) 2 %-1:200000 IJ SOLN
INTRAMUSCULAR | Status: AC
  Filled 2023-01-24: qty 20

## 2023-01-24 NOTE — Assessment & Plan Note (Signed)
On 6L chronically.  O2 sats down to 80's on 8 L, was placed on nonrebreather, prior to chest tube insertion.  Currently back on 6 L, sats 100%- s/p chest tube.

## 2023-01-24 NOTE — ED Notes (Signed)
Non rebreather placed on pt per PA

## 2023-01-24 NOTE — ED Provider Notes (Signed)
[place] EMERGENCY DEPARTMENT AT [facility] Provider Note   CSN: [id number] Arrival date & time: Feb 15, 2023  1520     History  Chief Complaint  Patient presents with   Shortness of Breath   HPI Bradley Rice is a 62 y.o. male with recurrent pneumothoraces, COPD, lung cancer and history of TIA presenting for shortness of breath.  Started 2 days ago. States it came on all of a sudden and has been progressively worse.  States he is normally on 6 L at home but has had to increase to 8 L.  Endorses chest pain that is left-sided.  During encounter, patient was on 8 L and satting in the low 80s.  Placed on nonrebreather and sats did improve.  Left-sided lung sounds were absent.  Prompted chest x-ray which per my wet read revealed a large left-sided pneumothorax with evidence of tracheal deviation.   Shortness of Breath      Home Medications Prior to Admission medications   Medication Sig Start Date End Date Taking? Authorizing Provider  albuterol (PROVENTIL) (2.5 MG/3ML) 0.083% nebulizer solution Take 3 mLs (2.5 mg total) by nebulization every 6 (six) hours as needed for wheezing or shortness of breath. 11/24/22   Glenford Bayley, NP  albuterol (VENTOLIN HFA) 108 (90 Base) MCG/ACT inhaler Inhale 2 puffs into the lungs every 6 (six) hours as needed for wheezing or shortness of breath. 11/24/22   Glenford Bayley, NP  ALPRAZolam Prudy Feeler) 0.25 MG tablet Take 1 tablet (0.25 mg total) by mouth at bedtime as needed for anxiety. 01/12/23   [name], FNP  benzonatate (TESSALON) 200 MG capsule Take 1 capsule (200 mg total) by mouth 3 (three) times daily as needed. 01/09/23   Doreatha Massed, MD  blood glucose meter kit and supplies Dispense based on patient and insurance preference. Use up to four times daily as directed. (FOR ICD-10 E10.9, E11.9). Patient not taking: Reported on 01/17/2023 05/18/21   [name], FNP  busPIRone (BUSPAR) 5 MG tablet Take 1 tablet (5 mg  total) by mouth 3 (three) times daily. 08/31/21   [name] A, FNP  CARBOPLATIN IV Inject into the vein every 21 ( twenty-one) days. 12/19/22   [provider]  cetirizine (ZYRTEC ALLERGY) 10 MG tablet Take 1 tablet (10 mg total) by mouth daily. 01/12/23   [name] A, FNP  docusate sodium (COLACE) 100 MG capsule Take 100 mg by mouth 2 (two) times daily.    [provider]  DULoxetine (CYMBALTA) 60 MG capsule Take 1 capsule (60 mg total) by mouth daily. 09/01/22   [name], FNP  DURVALUMAB IV Inject into the vein every 21 ( twenty-one) days. 12/19/22   [provider]  Ensure Max Protein (ENSURE MAX PROTEIN) LIQD Take 330 mLs by mouth 3 (three) times daily. 01/12/23   [name], FNP  ETOPOSIDE IV Inject into the vein every 21 ( twenty-one) days. 12/19/22   [provider]  famotidine (PEPCID) 20 MG tablet Take 1 tablet (20 mg total) by mouth 2 (two) times daily. 12/20/22   Doreatha Massed, MD  fentaNYL (DURAGESIC) 25 MCG/HR Place 1 patch onto the skin every 3 (three) days. 01/12/23   [provider]  fexofenadine (ALLEGRA) 180 MG tablet Take 1 tablet (180 mg total) by mouth daily. Patient not taking: Reported on 01/17/2023 02/28/22   [name], FNP  fluticasone [facility]) 50 MCG/ACT nasal spray Place 2 sprays into both nostrils daily.  Patient not taking: Reported on 01/17/2023 01/12/23   [name] A, FNP  HYDROcodone bit-homatropine (HYCODAN) 5-1.5 MG/5ML syrup Take 5 mLs by mouth every 6 (six) hours as needed for cough. 01/26/2023   Doreatha Massed, MD  ibuprofen (ADVIL) 800 MG tablet Take 1 tablet (800 mg total) by mouth every 8 (eight) hours as needed. 11/22/22   [name], FNP  lidocaine-prilocaine (EMLA) cream Apply a small amount to port a cath site and cover with plastic wrap 1 hour prior to infusion appointments 12/15/22   Doreatha Massed, MD  magic mouthwash w/lidocaine SOLN Take 10 mLs by mouth 3 (three)  times daily as needed for mouth pain. Patient not taking: Reported on 01/17/2023 11/18/22   [name], FNP  megestrol (MEGACE) 400 MG/10ML suspension Take 10 mLs (400 mg total) by mouth 2 (two) times daily. 12/19/22   Doreatha Massed, MD  metoprolol tartrate (LOPRESSOR) 25 MG tablet Take 0.5 tablets (12.5 mg total) by mouth 2 (two) times daily. 01/03/23 02/27/23  Burnadette Pop, MD  Misc. Devices MISC Patient require 6L Nasal Cannula Oxygen 12/13/22   Doreatha Massed, MD  naloxone [facility]) nasal spray 4 mg/0.1 mL Place 1 spray into the nose as needed (opioid overdose). Patient not taking: Reported on 01/17/2023    [provider]  Nintedanib (OFEV) 150 MG CAPS Take 1 capsule (150 mg total) by mouth 2 (two) times daily. 11/29/22   Glenford Bayley, NP  nortriptyline (PAMELOR) 50 MG capsule Take 50 mg by mouth 2 (two) times daily. 11/28/16   [provider]  oxyCODONE (OXYCONTIN) 20 mg 12 hr tablet Take 1 tablet (20 mg total) by mouth every 12 (twelve) hours. 01/19/23   Doreatha Massed, MD  oxyCODONE (ROXICODONE) 15 MG immediate release tablet Take 1 tablet (15 mg total) by mouth 4 (four) times daily. 01/09/23   Doreatha Massed, MD  prochlorperazine (COMPAZINE) 10 MG tablet Take 1 tablet (10 mg total) by mouth every 6 (six) hours as needed for nausea or vomiting. 12/15/22   Doreatha Massed, MD  traZODone (DESYREL) 50 MG tablet Take 2 tablets (100 mg total) by mouth at bedtime. Patient taking differently: Take 100 mg by mouth at bedtime as needed for sleep. 11/11/22   Cleora Fleet, MD      Allergies    Pirfenidone    Review of Systems   Review of Systems  Respiratory:  Positive for shortness of breath.     Physical Exam   Vitals:   01/25/2023 1745 01/12/2023 1800  BP: 94/67 95/66  Pulse: 91 91  Resp: 16 13  Temp:    SpO2: 100% 100%    CONSTITUTIONAL:  well-appearing,  respiratory distress, cachectic NEURO:  Alert and oriented x 3, CN 3-12  grossly intact EYES:  eyes equal and reactive ENT/NECK:  Supple, no stridor  CARDIO:  Tachycardia and regular rhythm, appears well-perfused  PULM:  Hypoxic, tachypneic on 8L [place], absent breath sounds in left-sided lung fields GI/GU:  non-distended MSK/SPINE:  No gross deformities, no edema, moves all extremities  SKIN:  no rash, atraumatic  *Additional and/or pertinent findings included in MDM below  ED Results / Procedures / Treatments   Labs (all labs ordered are listed, but only abnormal results are displayed) Labs Reviewed  BASIC METABOLIC PANEL - Abnormal; Notable for the following components:      Result Value   Chloride 97 (*)    Glucose, Bld 143 (*)    BUN 5 (*)  Creatinine, Ser 0.59 (*)    All other components within normal limits  CBC - Abnormal; Notable for the following components:   WBC 1.4 (*)    RBC 3.58 (*)    Hemoglobin 10.2 (*)    HCT 32.0 (*)    All other components within normal limits    EKG None  Radiology DG Chest Port 1 View  Result Date: 2023/02/21 CLINICAL DATA:  Chest tube placement, left pneumothorax EXAM: PORTABLE CHEST 1 VIEW COMPARISON:  2023/02/21 FINDINGS: Single frontal view of the chest demonstrates stable left chest wall port. Interval placement of a left chest tube with tip overlying left hilum, with near complete resolution of the left pneumothorax seen previously. Estimated less than 10% residual left apical pneumothorax, with pleural separation measuring 10 mm. Cardiac silhouette is stable. Extensive background scarring and fibrosis again noted. Rounded masslike consolidation right lung base unchanged. Small right pleural effusion versus pleural thickening. No acute fracture. IMPRESSION: 1. Near complete resolution of left pneumothorax after left chest tube placement. Estimated less than 10% residual left pneumothorax. 2. Stable background scarring and fibrosis, with persistent masslike consolidation at the right lung base. Electronically  Signed   By: Sharlet Salina M.D.   On: 02-21-2023 17:32   DG Chest Port 1 View  Result Date: 02/21/23 CLINICAL DATA:  141880 SOB (shortness of breath) [id number] EXAM: PORTABLE CHEST - 1 VIEW COMPARISON:  01/03/2023 FINDINGS: Large left pneumothorax with slight rightward shift of the mediastinum, new since previous. Stable left subclavian port catheter to the proximal right atrium. Progressive coarse interstitial opacities throughout both lungs. 2 left suprahilar endobronchial valves as before. Heart size difficult to assess due to adjacent opacities. Blunting of the right lateral costophrenic angle suggesting small effusion. Cervical fixation hardware partially visualized. IMPRESSION: 1. Large left pneumothorax with slight rightward shift of the mediastinum. Critical Value/emergent results were called by telephone at the time of interpretation on 02/21/23 at 4:25 pm to provider JOSEPH ZAMMIT , who verbally acknowledged these results. 2. Progressive coarse interstitial opacities throughout both lungs. Electronically Signed   By: Corlis Leak M.D.   On: 02-21-23 16:26    Procedures .Critical Care  Performed by: Gareth Eagle, PA-C Authorized by: Gareth Eagle, PA-C   Critical care provider statement:    Critical care time (minutes):  30   Critical care was necessary to treat or prevent imminent or life-threatening deterioration of the following conditions:  Respiratory failure   Critical care was time spent personally by me on the following activities:  Development of treatment plan with patient or surrogate, discussions with consultants, evaluation of patient's response to treatment, examination of patient, ordering and review of laboratory studies, ordering and review of radiographic studies, ordering and performing treatments and interventions, pulse oximetry, re-evaluation of patient's condition and review of old charts     Medications Ordered in ED Medications  ondansetron (ZOFRAN)  injection 4 mg (4 mg Intravenous Given 02-21-23 1658)  HYDROmorphone (DILAUDID) injection 1 mg (1 mg Intravenous Given 02-21-23 1659)    ED Course/ Medical Decision Making/ A&P Clinical Course as of 2023-02-21 1822  Tue 21-Feb-2023  1558 DG Chest [place] 1 View [JR]  1610 Dr. Katrinka Blazing [JR]  1742 Spoke to Dr. Katrinka Blazing pulmonary critical care at [facility] Who advised to admit to medicine at [facility] and pulmonary critical care will see him in consult. [JR]    Clinical Course User Index [JR] Gareth Eagle, PA-C  Medical Decision Making Amount and/or Complexity of Data Reviewed Labs: ordered. Radiology: ordered. Decision-making details documented in ED Course.  Risk Prescription drug management. Decision regarding hospitalization.   62 year old male in obvious respiratory distress presenting for shortness of breath.  Exam notable for absent breath sounds in all left sided lung fields, hypoxia, tachypnea and tachycardia.  Placed on nonrebreather.  Prompted stat chest x-ray which revealed large left-sided pneumothorax.  Consulted Dr. Lovell Sheehan of general surgery who was able to come down and evaluate the patient and placed a chest tube.  Follow-up x-ray revealed reexpansion of the left lung.  Patient also was notably more comfortable from a respiratory standpoint able to transition back to his normal 6 L of oxygen by nasal cannula.  Treated pain with Dilaudid.  Became transiently hypotensive after pain medicine.  Treated with a liter of fluids.  Blood pressure improved.  Spoke to Dr. Katrinka Blazing of pulmonary critical care at [facility].  Patient is known well to their team due to his recurrent pneumothoraces. Advised to admit to medicine at [facility] and Pulmonary critical care will see him in consult.  Admitted to hospital service with Dr. Wendall Stade.        Final Clinical Impression(s) / ED Diagnoses Final diagnoses:  Pneumothorax, unspecified type    Rx / DC  Orders ED Discharge Orders     None         Gareth Eagle, PA-C 01/12/2023 1823    Bethann Berkshire, MD 01/26/23 [phone]

## 2023-01-24 NOTE — Patient Instructions (Signed)
Visit Information  Bradley Rice was given information about Medicaid Managed Care team care coordination services as a part of their [employer] Medicaid benefit. Bradley Rice verbally consented to engagement with the Medicaid Managed Care team.   If you are experiencing a medical emergency, please call 911 or report to your local emergency department or urgent care.   If you have a non-emergency medical problem during routine business hours, please contact your provider's office and ask to speak with a nurse.   For questions related to your [employer] Medicaid health plan, please call: [phone] or visit the homepage here: [email or web address]  If you would like to schedule transportation through your [employer] Medicaid plan, please call the following number at least 2 days in advance of your appointment: [phone]  For information about your ride after you set it up, call Ride Assist at [phone]. Use this number to activate a Will Call pickup, or if your transportation is late for a scheduled pickup. Use this number, too, if you need to make a change or cancel a previously scheduled reservation.  If you need transportation services right away, call [phone]. The after-hours call center is staffed 24 hours to handle ride assistance and urgent reservation requests (including discharges) 365 days a year. Urgent trips include sick visits, hospital discharge requests and life-sustaining treatment.  Call the Behavioral Health Crisis Line at 1-844-594-5076, at any time, 24 hours a day, 7 days a week. If you are in danger or need immediate medical attention call 911.  If you would like help to quit smoking, call 1-800-QUIT-NOW (1-800-784-8669) OR Espaol: 1-855-Djelo-Ya (1-855-335-3569) o para ms informacin haga clic aqu or Text READY to 200-400 to register via text  Bradley Rice - following are the goals we discussed in your visit today:   Goals  Addressed   None     Social Worker will follow up in 30 days.  Celise Bazar, BSW, MHA [place]  Managed Medicaid Social Worker [phone]   Following is a copy of your plan of care:  There are no care plans that you recently modified to display for this patient.    

## 2023-01-24 NOTE — Assessment & Plan Note (Addendum)
Presenting with chest pain, difficulty breathing and acute chronic hypoxic respiratory failure.  Chest x-ray shows recurrent large left-sided pneumo thorax with slight rightward shift of the mediastinum.  Recent hospitalizations 5/24 5/30 for pneumothorax.  Heart intrabronchial [facility] during second admission.  Pneumothorax in the setting of extensive stage small cell lung cancer, COPD, and IPF. -Chest tube urgently inserted in ED by Dr. Lovell Sheehan - EDP talked to intensivist Dr. Katrinka Blazing, will see in consult at [facility], admit to hospitalist service -N.p.o. midnight -1 L bolus given for hypotension after 1 mg Dilaudid given. -Blood pressure has improved, reports recurrence of chest pain, continue with 0.5 mg Dilaudid every 6 hourly as needed

## 2023-01-24 NOTE — Telephone Encounter (Signed)
Patient's wife called to advise that patient has developed dry frequent cough, with no other associated symptoms.  Per Dr. Ellin Saba, Hycodan cough syrup sent to pharmacy every 6 hours as needed.

## 2023-01-24 NOTE — ED Notes (Signed)
Pt states the pain "got down to where he could tolerate it" but is now back to a 9.

## 2023-01-24 NOTE — H&P (Addendum)
History and Physical    ZOHAN SHIFLET WUJ:[id number] DOB: 05/14/1961 DOA: 01/26/2023  PCP: [name], FNP   Patient coming from: Home  I have personally briefly reviewed patient's old medical records in [facility]  Chief Complaint: Difficulty breathing  HPI: Bradley Rice is a 62 y.o. male with medical history significant for ALS, lung cancer, hypertension, idiopathic pulmonary fibrosis, chronic respiratory failure on 6 L at baseline. Patient presented to the ED with complaints of difficulty breathing, left-sided chest pain and cough that started yesterday.   Recent hospitalization 5/20 to 6/4 for recurrent left-sided pneumothorax, who was admitted at [facility] for pneumothorax and had chest tube 5/24 through 5/28.  He underwent bronchoscopy 01/02/2023 with placement of endobronchial valves.  ED Course: Temperature 97.9, heart rate initially up to 120s, improved status post chest tube.  Initial tachypnea 23-33 improved also.  O2 sats in the 80s on 8 L, patient was subsequently placed on nonrebreather. Chest x-ray showed large left-sided pneumothorax with slight rightward shift of the mediastinum. General surgery was consulted, patient ultimately underwent chest tube placement,. EDP talked to intensivist Dr. Katrinka Blazing, recommend admission to [facility] under hospitalist service, will see in consult.  Review of Systems: As per HPI all other systems reviewed and negative.  Past Medical History:  Diagnosis Date   ALS (amyotrophic lateral sclerosis) (HCC)    Asthma    Back pain    COPD (chronic obstructive pulmonary disease) (HCC)    Coronary artery calcification seen on CT scan    DDD (degenerative disc disease), lumbar    Depression    Dysphagia    Following previous surgery   History of blood transfusion 2014   Post-op bleed   History of lung cancer    History of stroke 2015   Idiopathic pulmonary fibrosis (HCC)    Migraine    Port-A-Cath in place 12/15/2022   Spider bite  2012    Past Surgical History:  Procedure Laterality Date   ANTERIOR CERVICAL DECOMP/DISCECTOMY FUSION  12/22/2011   Procedure: ANTERIOR CERVICAL DECOMPRESSION/DISCECTOMY FUSION 3 LEVELS;  Surgeon: Venita Lick, MD;  Location: [facility] OR;  Service: Orthopedics;  Laterality: N/A;  ACDF C5-T1   ANTERIOR CERVICAL DECOMP/DISCECTOMY FUSION  06/25/2012   Procedure: ANTERIOR CERVICAL DECOMPRESSION/DISCECTOMY FUSION 1 LEVEL/HARDWARE REMOVAL;  Surgeon: Kerrin Champagne, MD;  Location: [facility] OR;  Service: Orthopedics;  Laterality: N/A;  Removal anterior cervical plate N5-A2, Explore Fusion, Left C5-6, C6-7, C7-T1 Re-do Foraminotomy, Left open carpal tunnel release with release of ulnar nerve at [facility], Posterior Cervical Fusion with lateral mass screw   ANTERIOR CERVICAL DECOMP/DISCECTOMY FUSION N/A 03/25/2021   Procedure: ANTERIOR CERVICAL DECOMPRESSION/DISCECTOMY FUSION 2 LEVELS  C3-5;  Surgeon: Venita Lick, MD;  Location: [facility] OR;  Service: Orthopedics;  Laterality: N/A;  ANTERIOR CERVICAL DECOMPRESSION/DISCECTOMY FUSION 2 LEVELS  C3-5   BACK SURGERY     BRONCHIAL BIOPSY  01/02/2023   Procedure: BRONCHIAL BIOPSIES;  Surgeon: Josephine Igo, DO;  Location: [facility] ENDOS[facility];  Service: Pulmonary;;   BRONCHIAL WASHINGS  01/02/2023   Procedure: BRONCHIAL WASHINGS;  Surgeon: Josephine Igo, DO;  Location: [facility];  Service: Pulmonary;;   CARDIAC CATHETERIZATION     CARPAL TUNNEL RELEASE  06/25/2012   Procedure: CARPAL TUNNEL RELEASE;  Surgeon: Kerrin Champagne, MD;  Location: [facility] OR;  Service: Orthopedics;  Laterality: Left;  as above   CHOLECYSTECTOMY  2009   CORONARY ANGIOPLASTY  2001   [place]   INSERTION OF IBV VALVE  Left 01/02/2023   Procedure: INSERTION OF INTERBRONCHIAL VALVE (IBV);  Surgeon: Josephine Igo, DO;  Location: [facility];  Service: Pulmonary;  Laterality: Left;   IR IMAGING GUIDED PORT INSERTION  12/14/2022   LUMBAR FUSION  2000   POSTERIOR CERVICAL FUSION/FORAMINOTOMY  06/25/2012    Procedure: POSTERIOR CERVICAL FUSION/FORAMINOTOMY LEVEL 1;  Surgeon: Kerrin Champagne, MD;  Location: [facility];  Service: Orthopedics;  Laterality: N/A;  as above   POSTERIOR CERVICAL FUSION/FORAMINOTOMY N/A 12/17/2013   Procedure: REMOVAL OF POSTERIOR CERVICAL FUSION LATERAL MASS SCREWS AND RODS C5-T1, EXPLORE FUSION, LEFT SIX-SEVEN FORAMINOTOMY;  Surgeon: Kerrin Champagne, MD;  Location: [facility];  Service: Orthopedics;  Laterality: N/A;   VASECTOMY  1990   VIDEO BRONCHOSCOPY  01/02/2023   Procedure: VIDEO BRONCHOSCOPY WITHOUT FLUORO;  Surgeon: Josephine Igo, DO;  Location: [facility];  Service: Pulmonary;;     reports that he quit smoking about 7 years ago. His smoking use included cigarettes. He has a 30.00 pack-year smoking history. He has been exposed to tobacco smoke. He has never used smokeless tobacco. He reports that he does not drink alcohol and does not use drugs.  Allergies  Allergen Reactions   Pirfenidone Nausea And Vomiting and Other (See Comments)    Weight loss    Family History  Problem Relation Age of Onset   ALS Mother    Cancer Mother    Cancer Sister    ALS Sister    Cancer Brother        1 brother   Anesthesia problems Neg Hx     Prior to Admission medications   Medication Sig Start Date End Date Taking? Authorizing Provider  albuterol (PROVENTIL) (2.5 MG/3ML) 0.083% nebulizer solution Take 3 mLs (2.5 mg total) by nebulization every 6 (six) hours as needed for wheezing or shortness of breath. 11/24/22   Glenford Bayley, NP  albuterol (VENTOLIN HFA) 108 (90 Base) MCG/ACT inhaler Inhale 2 puffs into the lungs every 6 (six) hours as needed for wheezing or shortness of breath. 11/24/22   Glenford Bayley, NP  ALPRAZolam Prudy Feeler) 0.25 MG tablet Take 1 tablet (0.25 mg total) by mouth at bedtime as needed for anxiety. 01/12/23   [name], FNP  benzonatate (TESSALON) 200 MG capsule Take 1 capsule (200 mg total) by mouth 3 (three) times daily as needed. 01/09/23    Doreatha Massed, MD  blood glucose meter kit and supplies Dispense based on patient and insurance preference. Use up to four times daily as directed. (FOR ICD-10 E10.9, E11.9). Patient not taking: Reported on 01/17/2023 05/18/21   [name], FNP  busPIRone (BUSPAR) 5 MG tablet Take 1 tablet (5 mg total) by mouth 3 (three) times daily. 08/31/21   [name] A, FNP  CARBOPLATIN IV Inject into the vein every 21 ( twenty-one) days. 12/19/22   [provider]  cetirizine (ZYRTEC ALLERGY) 10 MG tablet Take 1 tablet (10 mg total) by mouth daily. 01/12/23   [name] A, FNP  docusate sodium (COLACE) 100 MG capsule Take 100 mg by mouth 2 (two) times daily.    [provider]  DULoxetine (CYMBALTA) 60 MG capsule Take 1 capsule (60 mg total) by mouth daily. 09/01/22   [name], FNP  DURVALUMAB IV Inject into the vein every 21 ( twenty-one) days. 12/19/22   [provider]  Ensure Max Protein (ENSURE MAX PROTEIN) LIQD Take 330 mLs by mouth 3 (three) times daily. 01/12/23   Hawks,  Christy A, FNP  ETOPOSIDE IV Inject into the vein every 21 ( twenty-one) days. 12/19/22   [provider]  famotidine (PEPCID) 20 MG tablet Take 1 tablet (20 mg total) by mouth 2 (two) times daily. 12/20/22   Doreatha Massed, MD  fentaNYL (DURAGESIC) 25 MCG/HR Place 1 patch onto the skin every 3 (three) days. 01/12/23   [provider]  fexofenadine (ALLEGRA) 180 MG tablet Take 1 tablet (180 mg total) by mouth daily. Patient not taking: Reported on 01/17/2023 02/28/22   [name], FNP  fluticasone [facility]) 50 MCG/ACT nasal spray Place 2 sprays into both nostrils daily. Patient not taking: Reported on 01/17/2023 01/12/23   [name] A, FNP  HYDROcodone bit-homatropine (HYCODAN) 5-1.5 MG/5ML syrup Take 5 mLs by mouth every 6 (six) hours as needed for cough. 01/13/2023   Doreatha Massed, MD  ibuprofen (ADVIL) 800 MG tablet Take 1 tablet (800 mg total) by mouth  every 8 (eight) hours as needed. 11/22/22   [name], FNP  lidocaine-prilocaine (EMLA) cream Apply a small amount to port a cath site and cover with plastic wrap 1 hour prior to infusion appointments 12/15/22   Doreatha Massed, MD  magic mouthwash w/lidocaine SOLN Take 10 mLs by mouth 3 (three) times daily as needed for mouth pain. Patient not taking: Reported on 01/17/2023 11/18/22   [name], FNP  megestrol (MEGACE) 400 MG/10ML suspension Take 10 mLs (400 mg total) by mouth 2 (two) times daily. 12/19/22   Doreatha Massed, MD  metoprolol tartrate (LOPRESSOR) 25 MG tablet Take 0.5 tablets (12.5 mg total) by mouth 2 (two) times daily. 01/03/23 2023/02/11  Burnadette Pop, MD  Misc. Devices MISC Patient require 6L Nasal Cannula Oxygen 12/13/22   Doreatha Massed, MD  naloxone [facility]) nasal spray 4 mg/0.1 mL Place 1 spray into the nose as needed (opioid overdose). Patient not taking: Reported on 01/17/2023    [provider]  Nintedanib (OFEV) 150 MG CAPS Take 1 capsule (150 mg total) by mouth 2 (two) times daily. 11/29/22   Glenford Bayley, NP  nortriptyline (PAMELOR) 50 MG capsule Take 50 mg by mouth 2 (two) times daily. 11/28/16   [provider]  oxyCODONE (OXYCONTIN) 20 mg 12 hr tablet Take 1 tablet (20 mg total) by mouth every 12 (twelve) hours. 01/19/23   Doreatha Massed, MD  oxyCODONE (ROXICODONE) 15 MG immediate release tablet Take 1 tablet (15 mg total) by mouth 4 (four) times daily. 01/09/23   Doreatha Massed, MD  prochlorperazine (COMPAZINE) 10 MG tablet Take 1 tablet (10 mg total) by mouth every 6 (six) hours as needed for nausea or vomiting. 12/15/22   Doreatha Massed, MD  traZODone (DESYREL) 50 MG tablet Take 2 tablets (100 mg total) by mouth at bedtime. Patient taking differently: Take 100 mg by mouth at bedtime as needed for sleep. 11/11/22   Cleora Fleet, MD    Physical Exam: Vitals:   01/26/2023 1715 01/10/2023 1730 01/14/2023 1745  01/23/2023 1800  BP: 95/70 (!) 88/68 94/67 95/66   Pulse: (!) 103 98 91 91  Resp: 19 16 16 13   Temp:      TempSrc:      SpO2: 98% 100% 100% 100%  Weight:      Height:        Constitutional: Chronically ill-appearing, cachectic,  Vitals:   01/23/2023 1715 01/15/2023 1730 01/04/2023 1745 01/14/2023 1800  BP: 95/70 (!) 88/68 94/67 95/66   Pulse: (!) 103 98 91 91  Resp: 19  16 16 13   Temp:      TempSrc:      SpO2: 98% 100% 100% 100%  Weight:      Height:       Eyes: PERRL, lids and conjunctivae normal ENMT: Mucous membranes are moist.  Neck: normal, supple, no masses, no thyromegaly Respiratory: Diffuse crackles, no wheezing,. Normal respiratory effort. No accessory muscle use.  Cardiovascular: Regular rate and rhythm, no murmurs / rubs / gallops. No extremity edema.  Extremities warm.  Port left upper chest. Abdomen: no tenderness, no masses palpated. No hepatosplenomegaly. Bowel sounds positive.  Musculoskeletal: Significant clubbing to fingers/ no cyanosis. No joint deformity upper and lower extremities.  Skin: no rashes, lesions, ulcers. No induration Neurologic: No apparent cranial abnormalities, moving extremities spontaneously. Psychiatric: Normal judgment and insight. Alert and oriented x 3. Normal mood.   Labs on Admission: I have personally reviewed following labs and imaging studies  CBC: Recent Labs  Lab 02-01-23 1609  WBC 1.4*  HGB 10.2*  HCT 32.0*  MCV 89.4  PLT 248   Basic Metabolic Panel: Recent Labs  Lab 02/01/23 1609  NA 136  K 3.6  CL 97*  CO2 30  GLUCOSE 143*  BUN 5*  CREATININE 0.59*  CALCIUM 9.2   Radiological Exams on Admission: DG Chest Port 1 View  Result Date: 2023-02-01 CLINICAL DATA:  Chest tube placement, left pneumothorax EXAM: PORTABLE CHEST 1 VIEW COMPARISON:  02-01-23 FINDINGS: Single frontal view of the chest demonstrates stable left chest wall port. Interval placement of a left chest tube with tip overlying left hilum, with near  complete resolution of the left pneumothorax seen previously. Estimated less than 10% residual left apical pneumothorax, with pleural separation measuring 10 mm. Cardiac silhouette is stable. Extensive background scarring and fibrosis again noted. Rounded masslike consolidation right lung base unchanged. Small right pleural effusion versus pleural thickening. No acute fracture. IMPRESSION: 1. Near complete resolution of left pneumothorax after left chest tube placement. Estimated less than 10% residual left pneumothorax. 2. Stable background scarring and fibrosis, with persistent masslike consolidation at the right lung base. Electronically Signed   By: Sharlet Salina M.D.   On: 02-01-23 17:32   DG Chest Port 1 View  Result Date: 01-Feb-2023 CLINICAL DATA:  [id number] SOB (shortness of breath) [id number] EXAM: PORTABLE CHEST - 1 VIEW COMPARISON:  01/03/2023 FINDINGS: Large left pneumothorax with slight rightward shift of the mediastinum, new since previous. Stable left subclavian port catheter to the proximal right atrium. Progressive coarse interstitial opacities throughout both lungs. 2 left suprahilar endobronchial valves as before. Heart size difficult to assess due to adjacent opacities. Blunting of the right lateral costophrenic angle suggesting small effusion. Cervical fixation hardware partially visualized. IMPRESSION: 1. Large left pneumothorax with slight rightward shift of the mediastinum. Critical Value/emergent results were called by telephone at the time of interpretation on 02/01/2023 at 4:25 pm to provider JOSEPH ZAMMIT , who verbally acknowledged these results. 2. Progressive coarse interstitial opacities throughout both lungs. Electronically Signed   By: Corlis Leak M.D.   On: 01-Feb-2023 16:26    EKG: Independently reviewed.  Sinus tachycardia, rate 121, QTc 506.  No significant changes from prior.  Assessment/Plan Principal Problem:   Recurrent left pneumothorax Active Problems:   Endobronchial  cancer, right (HCC)   Acute on chronic respiratory failure with hypoxia (HCC)   Depression   Chronic pain   IPF (idiopathic pulmonary fibrosis) (HCC)    Assessment and Plan: * Recurrent left pneumothorax Presenting with chest pain,  difficulty breathing and acute chronic hypoxic respiratory failure.  Chest x-ray shows recurrent large left-sided pneumo thorax with slight rightward shift of the mediastinum.  Recent hospitalizations 5/24 5/30 for pneumothorax.  Heart intrabronchial [facility] during second admission.  Pneumothorax in the setting of extensive stage small cell lung cancer, COPD, and IPF. -Chest tube urgently inserted in ED by Dr. Lovell Sheehan - EDP talked to intensivist Dr. Katrinka Blazing, will see in consult at [facility], admit to hospitalist service -N.p.o. midnight -1 L bolus given for hypotension after 1 mg Dilaudid given. -Blood pressure has improved, reports recurrence of chest pain, continue with 0.5 mg Dilaudid every 6 hourly as needed  Endobronchial cancer, right (HCC) History of extensive stage small cell lung cancer, currently on chemotherapy.  Follows with Dr. Ellin Saba.  Chest x-ray today shows persistent masslike consolidation in right lung base.  Acute on chronic respiratory failure with hypoxia (HCC) On 6L chronically.  O2 sats down to 80's on 8 L, was placed on nonrebreather, prior to chest tube insertion.  Currently back on 6 L, sats 100%- s/p chest tube.   DVT prophylaxis: SCDS pending Pulmonology eval in a.m Code Status: Full code, confirmed with patient and spouse at bedside. Family Communication: Spouse at bedside Disposition Plan: ~ 2 days Consults called: ~ 2 days Admission status: Obs tele    Author: Onnie Boer, MD 21-Feb-2023 8:18 PM  For on call review [email or web address].

## 2023-01-24 NOTE — Consult Note (Signed)
Reason for Consult: Recurrent left pneumothorax Referring Physician: Dr. Constance Holster is an 62 y.o. male.  HPI: Patient is a 62 year old white male with a history of advanced small cell lung carcinoma who required a left chest tube on Dec 23, 2022 for recurrence of a left pneumothorax.  He was treated at [facility] by Pulmonary service including placement of an endobronchial valve.  The chest tube was removed on 01/03/2023.  Patient started having left-sided chest pain and shortness of breath and presented to the emergency room.  He was found to have a complete recurrent left pneumothorax.  I was asked to place another chest tube.  Past Medical History:  Diagnosis Date   ALS (amyotrophic lateral sclerosis) (HCC)    Asthma    Back pain    COPD (chronic obstructive pulmonary disease) (HCC)    Coronary artery calcification seen on CT scan    DDD (degenerative disc disease), lumbar    Depression    Dysphagia    Following previous surgery   History of blood transfusion 2014   Post-op bleed   History of lung cancer    History of stroke 2015   Idiopathic pulmonary fibrosis (HCC)    Migraine    Port-A-Cath in place 12/15/2022   Spider bite 2012    Past Surgical History:  Procedure Laterality Date   ANTERIOR CERVICAL DECOMP/DISCECTOMY FUSION  12/22/2011   Procedure: ANTERIOR CERVICAL DECOMPRESSION/DISCECTOMY FUSION 3 LEVELS;  Surgeon: Venita Lick, MD;  Location: [facility] OR;  Service: Orthopedics;  Laterality: N/A;  ACDF C5-T1   ANTERIOR CERVICAL DECOMP/DISCECTOMY FUSION  06/25/2012   Procedure: ANTERIOR CERVICAL DECOMPRESSION/DISCECTOMY FUSION 1 LEVEL/HARDWARE REMOVAL;  Surgeon: Kerrin Champagne, MD;  Location: [facility];  Service: Orthopedics;  Laterality: N/A;  Removal anterior cervical plate Z6-X0, Explore Fusion, Left C5-6, C6-7, C7-T1 Re-do Foraminotomy, Left open carpal tunnel release with release of ulnar nerve at [facility], Posterior Cervical Fusion with lateral mass screw   ANTERIOR  CERVICAL DECOMP/DISCECTOMY FUSION N/A 03/25/2021   Procedure: ANTERIOR CERVICAL DECOMPRESSION/DISCECTOMY FUSION 2 LEVELS  C3-5;  Surgeon: Venita Lick, MD;  Location: [facility];  Service: Orthopedics;  Laterality: N/A;  ANTERIOR CERVICAL DECOMPRESSION/DISCECTOMY FUSION 2 LEVELS  C3-5   BACK SURGERY     BRONCHIAL BIOPSY  01/02/2023   Procedure: BRONCHIAL BIOPSIES;  Surgeon: Josephine Igo, DO;  Location: [facility];  Service: Pulmonary;;   BRONCHIAL WASHINGS  01/02/2023   Procedure: BRONCHIAL WASHINGS;  Surgeon: Josephine Igo, DO;  [facility]: [facility];  Service: Pulmonary;;   CARDIAC CATHETERIZATION     CARPAL TUNNEL RELEASE  06/25/2012   Procedure: CARPAL TUNNEL RELEASE;  Surgeon: Kerrin Champagne, MD;  Location: [facility];  Service: Orthopedics;  Laterality: Left;  as above   CHOLECYSTECTOMY  2009   CORONARY ANGIOPLASTY  2001   [place]   INSERTION OF IBV VALVE Left 01/02/2023   Procedure: INSERTION OF INTERBRONCHIAL VALVE (IBV);  Surgeon: Josephine Igo, DO;  [facility];  Service: Pulmonary;  Laterality: Left;   IR IMAGING GUIDED PORT INSERTION  12/14/2022   LUMBAR FUSION  2000   POSTERIOR CERVICAL FUSION/FORAMINOTOMY  06/25/2012   Procedure: POSTERIOR CERVICAL FUSION/FORAMINOTOMY LEVEL 1;  Surgeon: Kerrin Champagne, MD;  [facility];  Service: Orthopedics;  Laterality: N/A;  as above   POSTERIOR CERVICAL FUSION/FORAMINOTOMY N/A 12/17/2013   Procedure: REMOVAL OF POSTERIOR CERVICAL FUSION LATERAL MASS SCREWS AND RODS C5-T1, EXPLORE FUSION, LEFT SIX-SEVEN FORAMINOTOMY;  Surgeon: Kerrin Champagne, MD;  [facility]: [facility];  Service: Orthopedics;  Laterality: N/A;   VASECTOMY  1990   VIDEO BRONCHOSCOPY  01/02/2023   Procedure: VIDEO BRONCHOSCOPY WITHOUT FLUORO;  Surgeon: Josephine Igo, DO;  [facility];  Service: Pulmonary;;    Family History  Problem Relation Age of Onset   ALS Mother    Cancer Mother    Cancer Sister    ALS Sister    Cancer Brother        1 brother    Anesthesia problems Neg Hx     Social History:  reports that he quit smoking about 7 years ago. His smoking use included cigarettes. He has a 30.00 pack-year smoking history. He has been exposed to tobacco smoke. He has never used smokeless tobacco. He reports that he does not drink alcohol and does not use drugs.  Allergies:  Allergies  Allergen Reactions   Pirfenidone Nausea And Vomiting and Other (See Comments)    Weight loss    Medications: Prior to Admission: (Not in a hospital admission)   Results for orders placed or performed during the hospital encounter of 01/17/2023 (from the past 48 hour(s))  Basic metabolic panel     Status: Abnormal   Collection Time: 01/22/2023  4:09 PM  Result Value Ref Range   Sodium 136 135 - 145 mmol/L   Potassium 3.6 3.5 - 5.1 mmol/L   Chloride 97 (L) 98 - 111 mmol/L   CO2 30 22 - 32 mmol/L   Glucose, Bld 143 (H) 70 - 99 mg/dL    Comment: Glucose reference range applies only to samples taken after fasting for at least 8 hours.   BUN 5 (L) 6 - 20 mg/dL   Creatinine, Ser 8.11 (L) 0.61 - 1.24 mg/dL   Calcium 9.2 8.9 - 91.4 mg/dL   GFR, Estimated >78 >29 mL/min    Comment: (NOTE) Calculated using the CKD-EPI Creatinine Equation (2021)    Anion gap 9 5 - 15    Comment: Performed at [facility], [address] [place], [place] [address]  CBC     Status: Abnormal   Collection Time: 01/10/2023  4:09 PM  Result Value Ref Range   WBC 1.4 (LL) 4.0 - 10.5 K/uL    Comment: REPEATED TO VERIFY THIS CRITICAL RESULT HAS VERIFIED AND BEEN CALLED TO FLETCHER,A. BY ASHLEY FRATTO ON 06 25 2024 AT 1742, AND HAS BEEN READ BACK.     RBC 3.58 (L) 4.22 - 5.81 MIL/uL   Hemoglobin 10.2 (L) 13.0 - 17.0 g/dL   HCT 08.6 (L) 57.8 - 46.9 %   MCV 89.4 80.0 - 100.0 fL   MCH 28.5 26.0 - 34.0 pg   MCHC 31.9 30.0 - 36.0 g/dL   RDW 62.9 52.8 - 41.3 %   Platelets 248 150 - 400 K/uL   nRBC 0.0 0.0 - 0.2 %    Comment: Performed at [facility], [address], [place],  [place] [address]    DG Chest Port 1 View  Result Date: 01/05/2023 CLINICAL DATA:  Chest tube placement, left pneumothorax EXAM: PORTABLE CHEST 1 VIEW COMPARISON:  01/16/2023 FINDINGS: Single frontal view of the chest demonstrates stable left chest wall port. Interval placement of a left chest tube with tip overlying left hilum, with near complete resolution of the left pneumothorax seen previously. Estimated less than 10% residual left apical pneumothorax, with pleural separation measuring 10 mm. Cardiac silhouette is stable. Extensive background scarring and fibrosis again noted. Rounded masslike consolidation right lung  base unchanged. Small right pleural effusion versus pleural thickening. No acute fracture. IMPRESSION: 1. Near complete resolution of left pneumothorax after left chest tube placement. Estimated less than 10% residual left pneumothorax. 2. Stable background scarring and fibrosis, with persistent masslike consolidation at the right lung base. Electronically Signed   By: Sharlet Salina M.D.   On: 01/20/2023 17:32   DG Chest Port 1 View  Result Date: 01/14/2023 CLINICAL DATA:  [id number] SOB (shortness of breath) [id number] EXAM: PORTABLE CHEST - 1 VIEW COMPARISON:  01/03/2023 FINDINGS: Large left pneumothorax with slight rightward shift of the mediastinum, new since previous. Stable left subclavian port catheter to the proximal right atrium. Progressive coarse interstitial opacities throughout both lungs. 2 left suprahilar endobronchial valves as before. Heart size difficult to assess due to adjacent opacities. Blunting of the right lateral costophrenic angle suggesting small effusion. Cervical fixation hardware partially visualized. IMPRESSION: 1. Large left pneumothorax with slight rightward shift of the mediastinum. Critical Value/emergent results were called by telephone at the time of interpretation on 01/02/2023 at 4:25 pm to provider JOSEPH ZAMMIT , who verbally acknowledged these results. 2.  Progressive coarse interstitial opacities throughout both lungs. Electronically Signed   By: Corlis Leak M.D.   On: 01/22/2023 16:26    ROS:  Pertinent items are noted in HPI.  Blood pressure (!) 88/68, pulse 98, temperature 97.9 F (36.6 C), temperature source Oral, resp. rate 16, height 6' (1.829 m), weight 57.2 kg, SpO2 100 %. Physical Exam: Pleasant white male who complains of left-sided chest pain. Head is normocephalic, atraumatic Lung examination reveals decreased breath sounds on the left side. Heart examination reveals a regular rate and rhythm without S3, S4, murmurs  Chest x-ray reveals almost complete left pneumothorax. Procedure note: The procedure was done in the emergency room at bedside.  The patient was placed in the right lateral decubitus position.  The left chest was prepped and draped using the usual sterile technique with ChloraPrep.  Surgical site confirmation was performed.  1% Xylocaine was used for local anesthesia.  An 8 French percutaneous chest tube was placed.  It was secured at the skin level using a 3-0 silk suture.  It was connected to Pleur-evac at -20 cm suction.  A follow-up chest x-ray revealed a less than 10% residual left pneumothorax.  The patient's oxygen saturations were in the 100% on a Ventimask.  Patient tolerated the procedure well.  Assessment/Plan: Impression: Recurrent left pneumothorax, status post left chest tube placement Plan: Patient will be transferred to [facility] for further management by the pulmonary service as they are not available at [facility].  Bradley Rice 01/03/2023, 5:49 PM

## 2023-01-24 NOTE — ED Notes (Signed)
Report given.

## 2023-01-24 NOTE — Patient Outreach (Signed)
Medicaid Managed Care Social Work Note  02-22-23 Name:  Bradley Rice MRN:  [id number] DOB:  1960/08/23  Bradley Rice is an 62 y.o. year old male who is a primary patient of [name], FNP.  The [facility] Managed Care Coordination team was consulted for assistance with:  Level of Care Concerns  [name] was given information about Medicaid Managed Care Coordination team services today. [name] Primary Caregiver agreed to services and verbal consent obtained.  Engaged with patient  for by telephone forfollow up visit in response to referral for case management and/or care coordination services.   Assessments/Interventions:  Review of past medical history, allergies, medications, health status, including review of consultants reports, laboratory and other test data, was performed as part of comprehensive evaluation and provision of chronic care management services.  SDOH: (Social Determinant of Health) assessments and interventions performed: SDOH Interventions    Flowsheet Row Patient Outreach Telephone from 01/17/2023 in [facility] Office Visit from 01/12/2023 in [facility] Clinical Support from 01/05/2023 in [facility] Office Visit from 09/01/2022 in [facility] Office Visit from 02/10/2022 in [facility] Video Visit from 12/18/2020 in [facility]  SDOH Interventions        Transportation Interventions Intervention Not Indicated -- -- -- -- --  Depression Interventions/Treatment  -- Currently on Treatment -- Currently on Treatment Currently on Treatment Currently on Treatment, Patient refuses Treatment  Financial Strain Interventions Other (Comment)  [provided [email or web address]-[email or web address] to apply for food benefits, advised patient to apply for disability] -- Artist -- -- --     BSW completed a  telephone outreach with patients wife, she states they did receive resources BSW sent, she has to mail her income back to DSS. She states patient is not doing well today and would like someone to come in and assist with him. BSW discussed possibly getting an aide in the home. BSW will send PCP a message. Not addressed in this encounter. Advanced Directives Status:  Not addressed in this encounter.  Care Plan                 Allergies  Allergen Reactions   Pirfenidone Nausea And Vomiting and Other (See Comments)    Weight loss    Medications Reviewed Today     Reviewed by Heidi Dach, RN (Registered Nurse) on 01/17/23 at 1457  Med List Status: <None>   Medication Order Taking? Sig Documenting Provider Last Dose Status Informant  albuterol (PROVENTIL) (2.5 MG/3ML) 0.083% nebulizer solution [id number] Yes Take 3 mLs (2.5 mg total) by nebulization every 6 (six) hours as needed for wheezing or shortness of breath. Glenford Bayley, NP Taking Active Self  albuterol (VENTOLIN HFA) 108 (90 Base) MCG/ACT inhaler [id number] Yes Inhale 2 puffs into the lungs every 6 (six) hours as needed for wheezing or shortness of breath. Glenford Bayley, NP Taking Active Self  ALPRAZolam Prudy Feeler) 0.25 MG tablet [id number] Yes Take 1 tablet (0.25 mg total) by mouth at bedtime as needed for anxiety. [name], FNP Taking Active   benzonatate (TESSALON) 200 MG capsule [id number] Yes Take 1 capsule (200 mg total) by mouth 3 (three) times daily as needed. Doreatha Massed, MD Taking Active   blood glucose meter kit and supplies [id number] No Dispense based on patient and insurance preference. Use up to four times daily as directed. (  FOR ICD-10 E10.9, E11.9).  Patient not taking: Reported on 01/17/2023   [name], FNP Not Taking Active Self  busPIRone (BUSPAR) 5 MG tablet [id number] Yes Take 1 tablet (5 mg total) by mouth 3 (three) times daily. [name], FNP Taking Active Self  CARBOPLATIN IV  [id number] Yes Inject into the vein every 21 ( twenty-one) days. [provider] Taking Active Self           Med Note (SATTERFIELD, Genoveva Ill   Thu Dec 29, 2022  8:35 PM) Patient not sure of dose, he states he did take today  cetirizine (ZYRTEC ALLERGY) 10 MG tablet [id number] Yes Take 1 tablet (10 mg total) by mouth daily. [name], FNP Taking Active   docusate sodium (COLACE) 100 MG capsule [id number] Yes Take 100 mg by mouth 2 (two) times daily. [provider] Taking Active   DULoxetine (CYMBALTA) 60 MG capsule [id number] Yes Take 1 capsule (60 mg total) by mouth daily. [name], FNP Taking Active Self  DURVALUMAB IV [id number] Yes Inject into the vein every 21 ( twenty-one) days. [provider] Taking Active Self           Med Note (SATTERFIELD, Vito Berger Dec 29, 2022  8:35 PM) Patient not sure of dose. He states he did take today   Ensure Max Protein (ENSURE MAX PROTEIN) LIQD [id number] Yes Take 330 mLs by mouth 3 (three) times daily. [name], FNP Taking Active            Med Note Ardelia Mems, MELANIE A   Tue Jan 17, 2023  2:48 PM) Drinking 2-3 daily  ETOPOSIDE IV [id number] Yes Inject into the vein every 21 ( twenty-one) days. [provider] Taking Active Self           Med Note (SATTERFIELD, Vito Berger Dec 29, 2022  8:35 PM) Patient not sure of dose. He states he did take today   famotidine (PEPCID) 20 MG tablet [id number] Yes Take 1 tablet (20 mg total) by mouth 2 (two) times daily. Doreatha Massed, MD Taking Active Self  fentaNYL (DURAGESIC) 25 MCG/HR [id number] Yes Place 1 patch onto the skin every 3 (three) days. [provider] Taking Active   fexofenadine (ALLEGRA) 180 MG tablet [id number] No Take 1 tablet (180 mg total) by mouth daily.  Patient not taking: Reported on 01/17/2023   [name], FNP Not Taking Active Self  fluticasone (FLONASE) 50 MCG/ACT nasal spray [id number] No Place 2 sprays into both  nostrils daily.  Patient not taking: Reported on 01/17/2023   [name], FNP Not Taking Active   ibuprofen (ADVIL) 800 MG tablet [id number] Yes Take 1 tablet (800 mg total) by mouth every 8 (eight) hours as needed. [name], FNP Taking Active Self  lidocaine-prilocaine (EMLA) cream [id number] Yes Apply a small amount to port a cath site and cover with plastic wrap 1 hour prior to infusion appointments Doreatha Massed, MD Taking Active Self  magic mouthwash w/lidocaine SOLN [id number] No Take 10 mLs by mouth 3 (three) times daily as needed for mouth pain.  Patient not taking: Reported on 01/17/2023   [name], FNP Not Taking Active Self           Med Note (SATTERFIELD, Vito Berger Dec 29, 2022  8:38 PM)    megestrol (MEGACE) 400 MG/10ML suspension [id number] Yes Take 10 mLs (400 mg total) by mouth  2 (two) times daily. Doreatha Massed, MD Taking Active Self  metoprolol tartrate (LOPRESSOR) 25 MG tablet [id number] Yes Take 0.5 tablets (12.5 mg total) by mouth 2 (two) times daily. Burnadette Pop, MD Taking Active   Misc. Devices MISC [id number] Yes Patient require 6L Nasal Cannula Oxygen Doreatha Massed, MD Taking Active Self  naloxone [facility]) nasal spray 4 mg/0.1 mL [id number] No Place 1 spray into the nose as needed (opioid overdose).  Patient not taking: Reported on 01/17/2023   [provider] Not Taking Active Self  Nintedanib (OFEV) 150 MG CAPS [id number] Yes Take 1 capsule (150 mg total) by mouth 2 (two) times daily. Glenford Bayley, NP Taking Active Self  nortriptyline (PAMELOR) 50 MG capsule [id number] Yes Take 50 mg by mouth 2 (two) times daily. [provider] Taking Active Self  oxyCODONE (OXYCONTIN) 20 mg 12 hr tablet [id number] Yes Take 1 tablet (20 mg total) by mouth every 12 (twelve) hours. Doreatha Massed, MD Taking Active Self  oxyCODONE (ROXICODONE) 15 MG immediate release tablet [id number] Yes Take 1 tablet (15 mg total) by  mouth 4 (four) times daily. Doreatha Massed, MD Taking Active   prochlorperazine (COMPAZINE) 10 MG tablet [id number] Yes Take 1 tablet (10 mg total) by mouth every 6 (six) hours as needed for nausea or vomiting. Doreatha Massed, MD Taking Active Self  traZODone (DESYREL) 50 MG tablet [id number] Yes Take 2 tablets (100 mg total) by mouth at bedtime.  Patient taking differently: Take 100 mg by mouth at bedtime as needed for sleep.   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   Acute on chronic respiratory failure with hypoxia (HCC) 11/09/2022   Hypoalbuminemia due to protein-calorie malnutrition (HCC) 11/09/2022   ALS (amyotrophic lateral sclerosis) (HCC) 11/09/2022   Anxiety 11/09/2022   Pleural effusion on right 11/09/2022   Primary insomnia 09/01/2022   Fusion of spine, cervical region 03/25/2021   Emphysema lung (HCC) 12/10/2019   IPF (idiopathic pulmonary fibrosis) (HCC) 07/15/2018   Chronic back pain 04/30/2018   Chronic pain 03/15/2018   Hypertension 09/30/2016   GAD (generalized anxiety disorder) 02/17/2015   Depression 02/17/2015   Hx of smoking 02/17/2015   Postlaminectomy syndrome, cervical region 08/15/2014   Cervical dystonia 06/13/2014   Cervicogenic headache 05/09/2014   TIA (transient ischemic attack) 12/28/2013   Cervical spondylosis with radiculopathy 12/17/2013   ED (erectile dysfunction) 11/21/2013   Multilevel spondylosis 06/25/2012   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   Follow up:  Patient agrees to Care Plan and Follow-up.  Plan: The Managed Medicaid care management team will reach out to the patient again over the next 30 days.  Date/time of next scheduled Social Work care management/care coordination outreach:  02/23/23  Gus Puma, Kenard Gower, [facility]  Managed [facility] Social Worker [phone]

## 2023-01-24 NOTE — ED Notes (Signed)
Will call [facility] [address] back for report in 10 mins.

## 2023-01-24 NOTE — Assessment & Plan Note (Addendum)
History of extensive stage small cell lung cancer, currently on chemotherapy.  Follows with Dr. Ellin Saba.  Chest x-ray today shows persistent masslike consolidation in right lung base.

## 2023-01-24 NOTE — ED Notes (Signed)
Chest tube placed by Dr. Lovell Sheehan.

## 2023-01-24 NOTE — ED Triage Notes (Signed)
[facility] for SOB starting last night. Worse with movement. Pt has hx of left sided Pneumonia. Also has 2 endobroncial valves and feels one may be displaced. Hx of lung cancer and is pt of Dr. Kirtland Bouchard.

## 2023-01-25 DIAGNOSIS — E43 Unspecified severe protein-calorie malnutrition: Secondary | ICD-10-CM | POA: Insufficient documentation

## 2023-01-25 DIAGNOSIS — J939 Pneumothorax, unspecified: Secondary | ICD-10-CM | POA: Diagnosis not present

## 2023-01-25 DIAGNOSIS — J9621 Acute and chronic respiratory failure with hypoxia: Secondary | ICD-10-CM | POA: Diagnosis not present

## 2023-01-25 DIAGNOSIS — J84112 Idiopathic pulmonary fibrosis: Secondary | ICD-10-CM | POA: Diagnosis not present

## 2023-01-25 LAB — CBC
HCT: 27.9 % — ABNORMAL LOW (ref 39.0–52.0)
Hemoglobin: 8.8 g/dL — ABNORMAL LOW (ref 13.0–17.0)
MCH: 28.1 pg (ref 26.0–34.0)
MCHC: 31.5 g/dL (ref 30.0–36.0)
MCV: 89.1 fL (ref 80.0–100.0)
Platelets: 235 10*3/uL (ref 150–400)
RBC: 3.13 MIL/uL — ABNORMAL LOW (ref 4.22–5.81)
RDW: 14.9 % (ref 11.5–15.5)
WBC: 1.7 10*3/uL — ABNORMAL LOW (ref 4.0–10.5)
nRBC: 0 % (ref 0.0–0.2)

## 2023-01-25 LAB — BASIC METABOLIC PANEL
Anion gap: 7 (ref 5–15)
BUN: <5 mg/dL — ABNORMAL LOW (ref 6–20)
CO2: 31 mmol/L (ref 22–32)
Calcium: 8.8 mg/dL — ABNORMAL LOW (ref 8.9–10.3)
Chloride: 98 mmol/L (ref 98–111)
Creatinine, Ser: 0.68 mg/dL (ref 0.61–1.24)
GFR, Estimated: >60 mL/min (ref >60)
Glucose, Bld: 104 mg/dL — ABNORMAL HIGH (ref 70–99)
Potassium: 4.5 mmol/L (ref 3.5–5.1)
Sodium: 136 mmol/L (ref 135–145)

## 2023-01-25 MED ORDER — SODIUM CHLORIDE 0.9% FLUSH
10.0000 mL | Freq: Three times a day (TID) | INTRAVENOUS | Status: DC
  Administered 2023-01-25 – 2023-02-01 (×18): 10 mL via INTRAPLEURAL

## 2023-01-25 MED ORDER — ENSURE ENLIVE PO LIQD
237.0000 mL | Freq: Two times a day (BID) | ORAL | Status: DC
  Administered 2023-01-25: 237 mL via ORAL

## 2023-01-25 MED ORDER — KETOROLAC TROMETHAMINE 15 MG/ML IJ SOLN
15.0000 mg | Freq: Four times a day (QID) | INTRAMUSCULAR | Status: AC
  Administered 2023-01-25 – 2023-01-30 (×16): 15 mg via INTRAVENOUS
  Filled 2023-01-25 (×17): qty 1

## 2023-01-25 MED ORDER — HYDROMORPHONE HCL 1 MG/ML IJ SOLN
0.5000 mg | Freq: Once | INTRAMUSCULAR | Status: AC
  Administered 2023-01-25: 0.5 mg via INTRAVENOUS

## 2023-01-25 MED ORDER — ADULT MULTIVITAMIN W/MINERALS CH
1.0000 | ORAL_TABLET | Freq: Every day | ORAL | Status: DC
  Administered 2023-01-25 – 2023-02-01 (×7): 1 via ORAL
  Filled 2023-01-25 (×8): qty 1

## 2023-01-25 MED ORDER — ALUM & MAG HYDROXIDE-SIMETH 200-200-20 MG/5ML PO SUSP
30.0000 mL | ORAL | Status: DC | PRN
  Administered 2023-01-25: 30 mL via ORAL
  Filled 2023-01-25: qty 30

## 2023-01-25 MED ORDER — ENSURE ENLIVE PO LIQD
237.0000 mL | Freq: Four times a day (QID) | ORAL | Status: DC
  Administered 2023-01-25 – 2023-02-01 (×18): 237 mL via ORAL

## 2023-01-25 MED ORDER — HYDROMORPHONE HCL 1 MG/ML IJ SOLN
1.0000 mg | INTRAMUSCULAR | Status: DC | PRN
  Administered 2023-01-25 (×2): 1 mg via INTRAVENOUS
  Filled 2023-01-25 (×2): qty 1

## 2023-01-25 MED ORDER — HYDROMORPHONE HCL 1 MG/ML IJ SOLN
1.0000 mg | INTRAMUSCULAR | Status: DC | PRN
  Administered 2023-01-25 (×3): 1.5 mg via INTRAVENOUS
  Administered 2023-01-26: 1 mg via INTRAVENOUS
  Administered 2023-01-26: 1.5 mg via INTRAVENOUS
  Administered 2023-01-26 (×2): 1 mg via INTRAVENOUS
  Administered 2023-01-26 (×2): 1.5 mg via INTRAVENOUS
  Administered 2023-01-26 – 2023-01-28 (×5): 1 mg via INTRAVENOUS
  Administered 2023-01-28 – 2023-01-29 (×2): 0.5 mg via INTRAVENOUS
  Administered 2023-01-30 – 2023-02-01 (×13): 1.5 mg via INTRAVENOUS
  Administered 2023-02-01: 1 mg via INTRAVENOUS
  Administered 2023-02-01 (×2): 1.5 mg via INTRAVENOUS
  Filled 2023-01-25 (×2): qty 1
  Filled 2023-01-25: qty 1.5
  Filled 2023-01-25 (×2): qty 1
  Filled 2023-01-25: qty 1.5
  Filled 2023-01-25: qty 1
  Filled 2023-01-25 (×6): qty 1.5
  Filled 2023-01-25 (×3): qty 1
  Filled 2023-01-25 (×4): qty 1.5
  Filled 2023-01-25: qty 1
  Filled 2023-01-25: qty 1.5
  Filled 2023-01-25: qty 1
  Filled 2023-01-25 (×3): qty 1.5
  Filled 2023-01-25 (×2): qty 1
  Filled 2023-01-25 (×5): qty 1.5

## 2023-01-25 MED ORDER — OXYCODONE-ACETAMINOPHEN 5-325 MG PO TABS
1.0000 | ORAL_TABLET | ORAL | Status: DC | PRN
  Administered 2023-01-25 – 2023-01-26 (×2): 1 via ORAL
  Filled 2023-01-25 (×2): qty 1

## 2023-01-25 NOTE — Discharge Instructions (Signed)

## 2023-01-25 NOTE — Progress Notes (Signed)
     [address]       [place] [address]             [phone]       This patient is well-known to the service.  He was previously turned down for any surgical intervention due to recurrent left-sided pneumothorax due to his significant comorbidities including interstitial lung disease, lung cancer, and significant emphysematous disease.  He really presents with another pneumothorax after undergoing endobronchial valve placement.  Unfortunately he remains a candidate for any surgical intervention.  Continue conservative management of chest tube.  Airel Magadan Keane Scrape

## 2023-01-25 NOTE — Progress Notes (Signed)
[facility]                                                                               Bradley Rice, is a 62 y.o. male, DOB - Dec 19, 1960, WCH:[id number] Admit date - 01/28/2023    Outpatient Primary MD for the patient is Bradley Spencer, Bradley Rice  LOS - 1  days    Brief summary   Bradley Rice is a 62 y.o. male with medical history significant for ALS, lung cancer, hypertension, idiopathic pulmonary fibrosis, chronic respiratory failure on 6 L at baseline. Patient presented to the ED with complaints of difficulty breathing, left-sided chest pain and cough that started yesterday.    Recent hospitalization 5/20 to 6/4 for recurrent left-sided pneumothorax, who was admitted at [facility] for pneumothorax and had chest tube 5/24 through 5/28.  He underwent bronchoscopy 01/02/2023 with placement of endobronchial valves.  Chest x-ray showed large left-sided pneumothorax with slight rightward shift of the mediastinum. General surgery was consulted, patient ultimately underwent chest tube placement,. EDP talked to intensivist Dr. Katrinka Blazing, recommend admission to [facility] under hospitalist service, will see in consult.   Assessment & Plan    Assessment and Plan:   * Recurrent left pneumothorax/ ACUTE Presenting with chest pain, difficulty breathing and acute chronic hypoxic respiratory failure.  Chest x-ray shows recurrent large left-sided pneumo thorax with slight rightward shift of the mediastinum.  Recent hospitalizations 5/24 5/30 for pneumothorax.  Heart intrabronchial [facility] during second admission.  Pneumothorax in the setting of extensive stage small cell lung cancer, COPD, and IPF. -Chest tube urgently inserted in ED by Dr. Lovell Sheehan. -Pain control.  - Cardiothoracic Surgery consulted, recommendations given.    Endobronchial cancer, right (HCC) History of extensive stage small cell lung cancer, currently on chemotherapy.  Follows with Dr. Ellin Saba.  Chest x-ray today  shows persistent masslike consolidation in right lung base.  Acute on chronic respiratory failure with hypoxia (HCC) On 6L chronically.  O2 sats down to 80's on 8 L, was placed on nonrebreather, prior to chest tube insertion.  Currently back on 6 L, sats 100%- s/p chest tube.   RN Pressure Injury Documentation: Pressure Injury 12/29/22 Coccyx Upper Stage 2 -  Partial thickness loss of dermis presenting as a shallow open injury with a red, pink wound bed without slough. pin point size (Active)  12/29/22 1609  Location: Coccyx  Location Orientation: Upper  Staging: Stage 2 -  Partial thickness loss of dermis presenting as a shallow open injury with a red, pink wound bed without slough.  Wound Description (Comments): pin point size  Present on Admission: Yes  Dressing Type Foam - Lift dressing to assess site every shift 01/02/23 2000     Estimated body mass index is 16.95 kg/m as calculated from the following:   Height as of this encounter: 6' (1.829 m).   Weight as of this encounter: 56.7 kg.  Code Status: full code DVT Prophylaxis:  SCDs Start: 01/25/2023 2345   Level of Care: Level of care: Progressive Family Communication: none at bedside  Disposition Plan:     Remains inpatient appropriate:  s/p chest tube.   Procedures:  None.  Consultants:   Gen surgery  PCCM.    Antimicrobials:   Anti-infectives (From admission, onward)    None        Medications  Scheduled Meds:  feeding supplement  237 mL Oral BID BM   ketorolac  15 mg Intravenous Q6H   sodium chloride flush  10 mL Intrapleural Q8H   Continuous Infusions: PRN Meds:.acetaminophen **OR** acetaminophen, albuterol, HYDROmorphone (DILAUDID) injection, oxyCODONE-acetaminophen, polyethylene glycol    Subjective:   Bradley Rice was seen and examined today.  Still in pain.   Objective:   Vitals:   01/25/23 0825 01/25/23 0900 01/25/23 1214 01/25/23 1227  BP: 101/78 117/80 98/64   Pulse: 97 99 (!) 104    Resp: 18 19 20 18   Temp: 98 F (36.7 C) 98.2 F (36.8 C) 98.2 F (36.8 C)   TempSrc: Oral Oral Oral   SpO2: 100% 100% 100%   Weight:      Height:        Intake/Output Summary (Last 24 hours) at 01/25/2023 1537 Last data filed at 01/25/2023 0840 Gross per 24 hour  Intake 10 ml  Output 900 ml  Net -890 ml   Filed Weights   02-20-2023 1541 01/25/23 0418  Weight: 57.2 kg 56.7 kg     Exam  General exam: Appears calm and comfortable  Respiratory system: diminished at bases on 6 lit/min, s/p chest tube on the left.  Cardiovascular system: S1 & S2 heard, RRR. No JVD,  Gastrointestinal system: Abdomen is nondistended, soft and nontender.  Central nervous system: Alert and oriented. No focal neurological deficits. Extremities: Symmetric 5 x 5 power. Skin: No rashes,  Psychiatry: Mood & affect appropriate.    Data Reviewed:  I have personally reviewed following labs and imaging studies   CBC Lab Results  Component Value Date   WBC 1.7 (L) 01/25/2023   RBC 3.13 (L) 01/25/2023   HGB 8.8 (L) 01/25/2023   HCT 27.9 (L) 01/25/2023   MCV 89.1 01/25/2023   MCH 28.1 01/25/2023   PLT 235 01/25/2023   MCHC 31.5 01/25/2023   RDW 14.9 01/25/2023   LYMPHSABS 1.1 01/09/2023   MONOABS 0.8 01/09/2023   EOSABS 0.2 01/09/2023   BASOSABS 0.1 01/09/2023     Last metabolic panel Lab Results  Component Value Date   NA 136 01/25/2023   K 4.5 01/25/2023   CL 98 01/25/2023   CO2 31 01/25/2023   BUN <5 (L) 01/25/2023   CREATININE 0.68 01/25/2023   GLUCOSE 104 (H) 01/25/2023   GFRNONAA >60 01/25/2023   GFRAA >60 12/15/2019   CALCIUM 8.8 (L) 01/25/2023   PHOS 3.6 12/30/2022   PROT 7.1 01/09/2023   ALBUMIN 3.1 (L) 01/09/2023   LABGLOB 3.0 11/18/2022   AGRATIO 1.2 11/18/2022   BILITOT 0.4 01/09/2023   ALKPHOS 62 01/09/2023   AST 14 (L) 01/09/2023   ALT 19 01/09/2023   ANIONGAP 7 01/25/2023    CBG (last 3)  No results for input(s): "GLUCAP" in the last 72 hours.     Coagulation Profile: No results for input(s): "INR", "PROTIME" in the last 168 hours.   Radiology Studies: DG Chest Port 1 View  Result Date: 02/20/2023 CLINICAL DATA:  Chest tube placement, left pneumothorax EXAM: PORTABLE CHEST 1 VIEW COMPARISON:  02/20/2023 FINDINGS: Single frontal view of the chest demonstrates stable left chest wall port. Interval placement of a left chest tube with tip overlying left hilum, with near complete resolution of the left pneumothorax seen previously. Estimated less than 10%  residual left apical pneumothorax, with pleural separation measuring 10 mm. Cardiac silhouette is stable. Extensive background scarring and fibrosis again noted. Rounded masslike consolidation right lung base unchanged. Small right pleural effusion versus pleural thickening. No acute fracture. IMPRESSION: 1. Near complete resolution of left pneumothorax after left chest tube placement. Estimated less than 10% residual left pneumothorax. 2. Stable background scarring and fibrosis, with persistent masslike consolidation at the right lung base. Electronically Signed   By: Sharlet Salina M.D.   On: 01/12/2023 17:32   DG Chest Port 1 View  Result Date: 01/20/2023 CLINICAL DATA:  [id number] SOB (shortness of breath) [id number] EXAM: PORTABLE CHEST - 1 VIEW COMPARISON:  01/03/2023 FINDINGS: Large left pneumothorax with slight rightward shift of the mediastinum, new since previous. Stable left subclavian port catheter to the proximal right atrium. Progressive coarse interstitial opacities throughout both lungs. 2 left suprahilar endobronchial valves as before. Heart size difficult to assess due to adjacent opacities. Blunting of the right lateral costophrenic angle suggesting small effusion. Cervical fixation hardware partially visualized. IMPRESSION: 1. Large left pneumothorax with slight rightward shift of the mediastinum. Critical Value/emergent results were called by telephone at the time of interpretation  on 01/12/2023 at 4:25 pm to provider JOSEPH ZAMMIT , who verbally acknowledged these results. 2. Progressive coarse interstitial opacities throughout both lungs. Electronically Signed   By: Corlis Leak M.D.   On: 01/12/2023 16:26       Kathlen Mody M.D. [facility] 01/25/2023, 3:37 PM  Available via Epic secure chat 7am-7pm After 7 pm, please refer to night coverage provider listed on amion.

## 2023-01-25 NOTE — Consult Note (Signed)
NAME:  Bradley Rice, MRN:  [id number], DOB:  [birth year], LOS: 1 ADMISSION DATE:  01/02/2023, CONSULTATION DATE: 01/25/2023 REFERRING MD: [facility], CHIEF COMPLAINT: Recurrent left pneumothorax in the setting of endobronchial cancer  History of Present Illness:  Bradley Rice is a 62 year old male former smoker who quit 7 years ago he has known endobronchial cancer on the right and is currently receiving chemotherapy every 21 days last given on 01/04/2023.  He has had 3 left pneumothoraces with chest tubes times 3+ endobronchial valves placed by Dr. Tonia Brooms on the left early June or 2024.  He again presented to [facility] with chest pain and shortness of breath and was found to have a large left pneumothoraces.  He had a chest tube placed by surgical intervention at [facility].  Transferred to [facility] for further evaluation and treatment.  He is well-known to our practice.  He has never had a formal thoracic consult.  The last thoracic surgery to follow and confer with Dr. Tonia Brooms concerning further interventions.  Pertinent  Medical History   Past Medical History:  Diagnosis Date   ALS (amyotrophic lateral sclerosis) (HCC)    Asthma    Back pain    COPD (chronic obstructive pulmonary disease) (HCC)    Coronary artery calcification seen on CT scan    DDD (degenerative disc disease), lumbar    Depression    Dysphagia    Following previous surgery   History of blood transfusion 2014   Post-op bleed   History of lung cancer    History of stroke 2015   Idiopathic pulmonary fibrosis (HCC)    Migraine    Port-A-Cath in place 12/15/2022   Spider bite 2012     Significant Hospital Events: Including procedures, antibiotic start and stop dates in addition to other pertinent events   Chest tube inserted at [facility]  Interim History / Subjective:  Complains of back pain  Objective   Blood pressure 117/80, pulse 99, temperature 98.2 F (36.8 C), temperature source  Oral, resp. rate 19, height 6' (1.829 m), weight 56.7 kg, SpO2 100 %.        Intake/Output Summary (Last 24 hours) at 01/25/2023 0930 Last data filed at 01/25/2023 0840 Gross per 24 hour  Intake --  Output 900 ml  Net -900 ml   Filed Weights   01/09/2023 1541 01/25/23 0418  Weight: 57.2 kg 56.7 kg    Examination: General: Frail cachectic male complaining of back pain HENT: No JVD lymphadenopathy is appreciated Lungs: Decreased breath sounds throughout left chest tube is in place positive for air leak Cardiovascular: Sounds are regular Abdomen: Soft positive bowel sounds Extremities: Warm without edema Neuro: Mostly intact without focal defect GU: Voids  Resolved Hospital Problem list     Assessment & Plan:  Recurrent left pneumothorax in the setting of lung cancer on the right status post endobronchial valves this will be his third pneumothorax with a chest tube placement at [facility].  He had endobronchial valves placed by Dr. Tonia Brooms early June 2024.  He has not had a formal consult by cardiovascular thoracic surgery at this time. Continue chest tube to 20 cm suction he is positive for airleak at the time of this note Cardiovascular thoracic surgery consult Possible talc pleurodesis in the future Possible VATS in the future Last chemotherapy was January 04, 2023 with Durvalumab/Etopside chronic: Noted to have continued weight loss  Idiopathic pulmonary fibrosis O2 dependent at 6 L nasal  cannula Continue oxygen Bronchodilators as needed  Multiple cervical and lumbar surgical interventions with chronic pain syndrome Pain medications as needed   History of ALS Per primary  History of depression Per primary  Best Practice (right click and "Reselect all SmartList Selections" daily)   Diet/type: Regular consistency (see orders) DVT prophylaxis: not indicated GI prophylaxis: PPI Lines: Central line Foley:  Yes, and it is still needed Code Status:  full code Last  date of multidisciplinary goals of care discussion [tbd]  Labs   CBC: Recent Labs  Lab 16-Feb-2023 1609 01/25/23 0047  WBC 1.4* 1.7*  HGB 10.2* 8.8*  HCT 32.0* 27.9*  MCV 89.4 89.1  PLT 248 235    Basic Metabolic Panel: Recent Labs  Lab 16-Feb-2023 1609 01/25/23 0047  NA 136 136  K 3.6 4.5  CL 97* 98  CO2 30 31  GLUCOSE 143* 104*  BUN 5* <5*  CREATININE 0.59* 0.68  CALCIUM 9.2 8.8*   GFR: Estimated Creatinine Clearance: 78.8 mL/min (by C-G formula based on SCr of 0.68 mg/dL). Recent Labs  Lab 2023-02-16 1609 01/25/23 0047  WBC 1.4* 1.7*    Liver Function Tests: No results for input(s): "AST", "ALT", "ALKPHOS", "BILITOT", "PROT", "ALBUMIN" in the last 168 hours. No results for input(s): "LIPASE", "AMYLASE" in the last 168 hours. No results for input(s): "AMMONIA" in the last 168 hours.  ABG    Component Value Date/Time   PHART 7.47 (H) 11/08/2022 1751   PCO2ART 44 11/08/2022 1751   PO2ART 71 (L) 11/08/2022 1751   HCO3 31.3 (H) 11/08/2022 1751   TCO2 24 01/24/2018 1940   O2SAT 95.8 11/08/2022 1751     Coagulation Profile: No results for input(s): "INR", "PROTIME" in the last 168 hours.  Cardiac Enzymes: No results for input(s): "CKTOTAL", "CKMB", "CKMBINDEX", "TROPONINI" in the last 168 hours.  HbA1C: HB A1C (BAYER DCA - WAIVED)  Date/Time Value Ref Range Status  05/18/2021 11:38 AM 5.6 4.8 - 5.6 % Final    Comment:             Prediabetes: 5.7 - 6.4          Diabetes: >6.4          Glycemic control for adults with diabetes: <7.0               **Please note reference interval change**   02/03/2016 03:40 PM 5.6 <7.0 % Final    Comment:                                          Diabetic Adult            <7.0                                       Healthy Adult        4.3 - 5.7                                                           (DCCT/NGSP) American Diabetes Association's Summary of Glycemic Recommendations for Adults with Diabetes: Hemoglobin A1c  <7.0%. More stringent glycemic  goals (A1c <6.0%) may further reduce complications at the cost of increased risk of hypoglycemia.     CBG: No results for input(s): "GLUCAP" in the last 168 hours.  Review of Systems:   10 point review of system taken, please see HPI for positives and negatives.   Past Medical History:  He,  has a past medical history of ALS (amyotrophic lateral sclerosis) (HCC), Asthma, Back pain, COPD (chronic obstructive pulmonary disease) (HCC), Coronary artery calcification seen on CT scan, DDD (degenerative disc disease), lumbar, Depression, Dysphagia, History of blood transfusion (2014), History of lung cancer, History of stroke (2015), Idiopathic pulmonary fibrosis (HCC), Migraine, Port-A-Cath in place (12/15/2022), and Spider bite (2012).   Surgical History:   Past Surgical History:  Procedure Laterality Date   ANTERIOR CERVICAL DECOMP/DISCECTOMY FUSION  12/22/2011   Procedure: ANTERIOR CERVICAL DECOMPRESSION/DISCECTOMY FUSION 3 LEVELS;  Surgeon: Venita Lick, MD;  Location: [facility] OR;  Service: Orthopedics;  Laterality: N/A;  ACDF C5-T1   ANTERIOR CERVICAL DECOMP/DISCECTOMY FUSION  06/25/2012   Procedure: ANTERIOR CERVICAL DECOMPRESSION/DISCECTOMY FUSION 1 LEVEL/HARDWARE REMOVAL;  Surgeon: Kerrin Champagne, MD;  Location: [facility] OR;  Service: Orthopedics;  Laterality: N/A;  Removal anterior cervical plate Q2-V9, Explore Fusion, Left C5-6, C6-7, C7-T1 Re-do Foraminotomy, Left open carpal tunnel release with release of ulnar nerve at [facility], Posterior Cervical Fusion with lateral mass screw   ANTERIOR CERVICAL DECOMP/DISCECTOMY FUSION N/A 03/25/2021   Procedure: ANTERIOR CERVICAL DECOMPRESSION/DISCECTOMY FUSION 2 LEVELS  C3-5;  Surgeon: Venita Lick, MD;  Location: [facility] OR;  Service: Orthopedics;  Laterality: N/A;  ANTERIOR CERVICAL DECOMPRESSION/DISCECTOMY FUSION 2 LEVELS  C3-5   BACK SURGERY     BRONCHIAL BIOPSY  01/02/2023   Procedure: BRONCHIAL BIOPSIES;  Surgeon:  Josephine Igo, DO;  Location: [facility] ENDOSCOPY;  Service: Pulmonary;;   BRONCHIAL WASHINGS  01/02/2023   Procedure: BRONCHIAL WASHINGS;  Surgeon: Josephine Igo, DO;  Location: [facility];  Service: Pulmonary;;   CARDIAC CATHETERIZATION     CARPAL TUNNEL RELEASE  06/25/2012   Procedure: CARPAL TUNNEL RELEASE;  Surgeon: Kerrin Champagne, MD;  Location: [facility] OR;  Service: Orthopedics;  Laterality: Left;  as above   CHOLECYSTECTOMY  2009   CORONARY ANGIOPLASTY  2001   [place]   INSERTION OF IBV VALVE Left 01/02/2023   Procedure: INSERTION OF INTERBRONCHIAL VALVE (IBV);  Surgeon: Josephine Igo, DO;  Location: [facility];  Service: Pulmonary;  Laterality: Left;   IR IMAGING GUIDED PORT INSERTION  12/14/2022   LUMBAR FUSION  2000   POSTERIOR CERVICAL FUSION/FORAMINOTOMY  06/25/2012   Procedure: POSTERIOR CERVICAL FUSION/FORAMINOTOMY LEVEL 1;  Surgeon: Kerrin Champagne, MD;  Location: [facility];  Service: Orthopedics;  Laterality: N/A;  as above   POSTERIOR CERVICAL FUSION/FORAMINOTOMY N/A 12/17/2013   Procedure: REMOVAL OF POSTERIOR CERVICAL FUSION LATERAL MASS SCREWS AND RODS C5-T1, EXPLORE FUSION, LEFT SIX-SEVEN FORAMINOTOMY;  Surgeon: Kerrin Champagne, MD;  Location: [facility];  Service: Orthopedics;  Laterality: N/A;   VASECTOMY  1990   VIDEO BRONCHOSCOPY  01/02/2023   Procedure: VIDEO BRONCHOSCOPY WITHOUT FLUORO;  Surgeon: Josephine Igo, DO;  Location: [facility];  Service: Pulmonary;;     Social History:   reports that he quit smoking about 7 years ago. His smoking use included cigarettes. He has a 30.00 pack-year smoking history. He has been exposed to tobacco smoke. He has never used smokeless tobacco. He reports that he does not drink alcohol and does not use drugs.   Family History:  His family  history includes ALS in his mother and sister; Cancer in his brother, mother, and sister. There is no history of Anesthesia problems.   Allergies Allergies  Allergen Reactions   Pirfenidone Nausea And  Vomiting and Other (See Comments)    Weight loss     Home Medications  Prior to Admission medications   Medication Sig Start Date End Date Taking? Authorizing Provider  albuterol (PROVENTIL) (2.5 MG/3ML) 0.083% nebulizer solution Take 3 mLs (2.5 mg total) by nebulization every 6 (six) hours as needed for wheezing or shortness of breath. 11/24/22   Glenford Bayley, NP  albuterol (VENTOLIN HFA) 108 (90 Base) MCG/ACT inhaler Inhale 2 puffs into the lungs every 6 (six) hours as needed for wheezing or shortness of breath. 11/24/22   Glenford Bayley, NP  ALPRAZolam Prudy Feeler) 0.25 MG tablet Take 1 tablet (0.25 mg total) by mouth at bedtime as needed for anxiety. 01/12/23   [name], FNP  benzonatate (TESSALON) 200 MG capsule Take 1 capsule (200 mg total) by mouth 3 (three) times daily as needed. 01/09/23   Doreatha Massed, MD  blood glucose meter kit and supplies Dispense based on patient and insurance preference. Use up to four times daily as directed. (FOR ICD-10 E10.9, E11.9). Patient not taking: Reported on 01/17/2023 05/18/21   [name], FNP  busPIRone (BUSPAR) 5 MG tablet Take 1 tablet (5 mg total) by mouth 3 (three) times daily. 08/31/21   [name] A, FNP  CARBOPLATIN IV Inject into the vein every 21 ( twenty-one) days. 12/19/22   [provider]  cetirizine (ZYRTEC ALLERGY) 10 MG tablet Take 1 tablet (10 mg total) by mouth daily. 01/12/23   [name] A, FNP  docusate sodium (COLACE) 100 MG capsule Take 100 mg by mouth 2 (two) times daily.    [provider]  DULoxetine (CYMBALTA) 60 MG capsule Take 1 capsule (60 mg total) by mouth daily. 09/01/22   [name], FNP  DURVALUMAB IV Inject into the vein every 21 ( twenty-one) days. 12/19/22   [provider]  Ensure Max Protein (ENSURE MAX PROTEIN) LIQD Take 330 mLs by mouth 3 (three) times daily. 01/12/23   [name], FNP  ETOPOSIDE IV Inject into the vein every 21 ( twenty-one) days.  12/19/22   [provider]  famotidine (PEPCID) 20 MG tablet Take 1 tablet (20 mg total) by mouth 2 (two) times daily. 12/20/22   Doreatha Massed, MD  fentaNYL (DURAGESIC) 25 MCG/HR Place 1 patch onto the skin every 3 (three) days. 01/12/23   [provider]  fexofenadine (ALLEGRA) 180 MG tablet Take 1 tablet (180 mg total) by mouth daily. Patient not taking: Reported on 01/17/2023 02/28/22   [name], FNP  fluticasone [facility]) 50 MCG/ACT nasal spray Place 2 sprays into both nostrils daily. Patient not taking: Reported on 01/17/2023 01/12/23   [name] A, FNP  HYDROcodone bit-homatropine (HYCODAN) 5-1.5 MG/5ML syrup Take 5 mLs by mouth every 6 (six) hours as needed for cough. February 13, 2023   Doreatha Massed, MD  ibuprofen (ADVIL) 800 MG tablet Take 1 tablet (800 mg total) by mouth every 8 (eight) hours as needed. 11/22/22   [name], FNP  lidocaine-prilocaine (EMLA) cream Apply a small amount to port a cath site and cover with plastic wrap 1 hour prior to infusion appointments 12/15/22   Doreatha Massed, MD  magic mouthwash w/lidocaine SOLN Take 10 mLs by mouth 3 (three) times daily as needed for mouth  pain. Patient not taking: Reported on 01/17/2023 11/18/22   [name], FNP  megestrol (MEGACE) 400 MG/10ML suspension Take 10 mLs (400 mg total) by mouth 2 (two) times daily. 12/19/22   Doreatha Massed, MD  metoprolol tartrate (LOPRESSOR) 25 MG tablet Take 0.5 tablets (12.5 mg total) by mouth 2 (two) times daily. 01/03/23 02/06/2023  Burnadette Pop, MD  Misc. Devices MISC Patient require 6L Nasal Cannula Oxygen 12/13/22   Doreatha Massed, MD  naloxone [facility]) nasal spray 4 mg/0.1 mL Place 1 spray into the nose as needed (opioid overdose). Patient not taking: Reported on 01/17/2023    [provider]  Nintedanib (OFEV) 150 MG CAPS Take 1 capsule (150 mg total) by mouth 2 (two) times daily. 11/29/22   Glenford Bayley, NP  nortriptyline  (PAMELOR) 50 MG capsule Take 50 mg by mouth 2 (two) times daily. 11/28/16   [provider]  oxyCODONE (OXYCONTIN) 20 mg 12 hr tablet Take 1 tablet (20 mg total) by mouth every 12 (twelve) hours. 01/19/23   Doreatha Massed, MD  oxyCODONE (ROXICODONE) 15 MG immediate release tablet Take 1 tablet (15 mg total) by mouth 4 (four) times daily. 01/09/23   Doreatha Massed, MD  prochlorperazine (COMPAZINE) 10 MG tablet Take 1 tablet (10 mg total) by mouth every 6 (six) hours as needed for nausea or vomiting. 12/15/22   Doreatha Massed, MD  traZODone (DESYREL) 50 MG tablet Take 2 tablets (100 mg total) by mouth at bedtime. Patient taking differently: Take 100 mg by mouth at bedtime as needed for sleep. 11/11/22   Cleora Fleet, MD     Critical care time: Elizebeth Brooking Arafat Cocuzza ACNP Acute Care Nurse Practitioner [facility] Pulmonary/Critical Care Please consult Amion 01/25/2023, 9:31 AM

## 2023-01-25 NOTE — Progress Notes (Signed)
Pt expresses inadequacy of current pain regiment & staff, states that "Dr. Channing Mutters" last time was able to manage his pain well last admit. Pt inquired as to when he is due for medications. Explained that if "as needed", pt would need to request meds. Verbal understanding given. Education given to pt & pt's wife on call bell use. Wife frequently entering hallway to search for staff to ask for pain medications. Pt frequently expresses his goal to experience 0/10 pain.

## 2023-01-25 NOTE — TOC Initial Note (Addendum)
Transition of Care [facility]) - Initial/Assessment Note    Patient Details  Name: Bradley Rice MRN: [id number] Date of Birth: 1961-05-18  Transition of Care [facility]) CM/SW Contact:    [name], RN Phone Number: 01/25/2023, 11:58 AM  Clinical Narrative:                 From home with wife, [name], he has PCP, [name], he has insurance on file alone with Medicaid.Marland Kitchen Khe has home oxygen 6 liters with [employer], he as a walker.  He states wife will transport him home.  Wife is his support system , she is at home 24/7, he gets his medications from [facility].  Here with PTX, has chest tube in place.   Expected Discharge Plan: Home/Self Care Barriers to Discharge: Continued Medical Work up   Patient Goals and CMS Choice Patient states their goals for this hospitalization and ongoing recovery are:: return home   Choice offered to / list presented to : NA      Expected Discharge Plan and Services In-house Referral: NA Discharge Planning Services: CM Consult Post Acute Care Choice: NA Living arrangements for the past 2 months: Single Family Home                 DME Arranged: N/A DME Agency: NA       HH Arranged: NA          Prior Living Arrangements/Services Living arrangements for the past 2 months: Single Family Home Lives with:: Spouse Patient language and need for interpreter reviewed:: Yes Do you feel safe going back to the place where you live?: Yes      Need for Family Participation in Patient Care: Yes (Comment) Care giver support system in place?: Yes (comment) Current home services:  (home oxygen with [facility] 6 liters, walker) Criminal Activity/Legal Involvement Pertinent to Current Situation/Hospitalization: No - Comment as needed  Activities of Daily Living Home Assistive Devices/Equipment: Eyeglasses, Environmental consultant (specify type), Oxygen, Nebulizer ADL Screening (condition at time of admission) Patient's cognitive ability adequate to  safely complete daily activities?: Yes Is the patient deaf or have difficulty hearing?: No Does the patient have difficulty seeing, even when wearing glasses/contacts?: No Does the patient have difficulty concentrating, remembering, or making decisions?: No Patient able to express need for assistance with ADLs?: Yes Does the patient have difficulty dressing or bathing?: No Independently performs ADLs?: Yes (appropriate for developmental age) Does the patient have difficulty walking or climbing stairs?: Yes Weakness of Legs: None Weakness of Arms/Hands: None  Permission Sought/Granted                  Emotional Assessment Appearance:: Appears stated age Attitude/Demeanor/Rapport: Engaged Affect (typically observed): Appropriate Orientation: : Oriented to Self, Oriented to Place, Oriented to  Time   Psych Involvement: No (comment)  Admission diagnosis:  Pneumothorax, unspecified type [J93.9] Recurrent pneumothorax [J93.9] Patient Active Problem List   Diagnosis Date Noted   Pressure injury of skin 01/02/2023   Recurrent left pneumothorax 12/29/2022   Recurrent pneumothorax after chest tube removed 12/23/2022   Port-A-Cath in place 12/15/2022   Endobronchial cancer, right (HCC) 12/07/2022   Acute on chronic respiratory failure with hypoxia (HCC) 11/09/2022   Hypoalbuminemia due to protein-calorie malnutrition (HCC) 11/09/2022   ALS (amyotrophic lateral sclerosis) (HCC) 11/09/2022   Anxiety 11/09/2022   Pleural effusion on right 11/09/2022   Primary insomnia 09/01/2022   Fusion of spine, cervical region 03/25/2021   Emphysema lung (HCC) 12/10/2019  IPF (idiopathic pulmonary fibrosis) (HCC) 07/15/2018   Chronic back pain 04/30/2018   Chronic pain 03/15/2018   Hypertension 09/30/2016   GAD (generalized anxiety disorder) 02/17/2015   Depression 02/17/2015   Hx of smoking 02/17/2015   Postlaminectomy syndrome, cervical region 08/15/2014   Cervical dystonia 06/13/2014    Cervicogenic headache 05/09/2014   TIA (transient ischemic attack) 12/28/2013   Cervical spondylosis with radiculopathy 12/17/2013   ED (erectile dysfunction) 11/21/2013   Multilevel spondylosis 06/25/2012   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   Carpal tunnel syndrome of left wrist 06/25/2012    Class: Chronic   PCP:  [name], FNP Pharmacy:   [facility] - [place], [place] - [address] [place] [address] Phone: [phone] Fax: [phone]  [facility] - [place], [place] - [address] [place] [address] Phone: [phone] Fax: [phone]     Social Determinants of Health (SDOH) Social History: SDOH Screenings   Food Insecurity: No Food Insecurity (01/04/2023)  Housing: Low Risk  (01/04/2023)  Transportation Needs: No Transportation Needs (01/17/2023)  Utilities: Not At Risk (01/04/2023)  Depression (PHQ2-9): Medium Risk (01/12/2023)  Financial Resource Strain: Medium Risk (01/17/2023)  Physical Activity: Unknown (11/18/2022)  Social Connections: Unknown (11/18/2022)  Stress: Stress Concern Present (11/18/2022)  Tobacco Use: Medium Risk (02/16/2023)   SDOH Interventions:     Readmission Risk Interventions     No data to display

## 2023-01-25 NOTE — Progress Notes (Signed)
Initial Nutrition Assessment  DOCUMENTATION CODES:   Severe malnutrition in context of chronic illness, Underweight  INTERVENTION:  Continue Regular diet as ordered Increase Ensure Enlive po from BID to QID, each supplement provides 350 kcal and 20 grams of protein. Magic cup TID with meals, each supplement provides 290 kcal and 9 grams of protein MVI with minerals daily "High Calorie, High Protein Nutrition Therapy" handout added to AVS Recommend continuing to [facility]  NUTRITION DIAGNOSIS:   Severe Malnutrition related to chronic illness, cancer and cancer related treatments as evidenced by severe fat depletion, severe muscle depletion, percent weight loss.  GOAL:   Patient will meet greater than or equal to 90% of their needs  MONITOR:   PO intake, Supplement acceptance, Labs, Weight trends, Skin  REASON FOR ASSESSMENT:   Malnutrition Screening Tool    ASSESSMENT:   Pt admitted with difficulty breathing, found to have recurrent L pneumothorax. PMH significant for advanced SCLC (currently on chemo) who required L chest tube (12/23/22) d/t L sided pneumothorax, s/p endobrachial valve placement, ALS, HTN, idiopathic pulmonary fibrosis, chronic respiratory failure on 6L baseline.   02/20/23 - s/p chest tube placement  Pt sitting up on EOB. Expresses frustrations over poor pain management regimen.   He recalls having better intake at home versus inpatient d/t pain levels. He denies having adverse side effects from chemotherapy except for decrease in appetite leading to smaller portion sizes. He has been trying to consume 3 Ensure supplements at home. Pt recalls having difficulty swallowing secondary to an accident in 2015 resulting in neck surgery, this was manageable until cancer diagnosis. He does well with swallowing strategies when not experiencing significant pain.   Pt states that since April, his weight has declined from 160 lbs down  to 125 lbs and has been holding steady around 125 lbs.   Per review of weight history, pt noted to have a 17.7% weight loss (02/01-06/26) which is clinically significant for time frame.   Medications: reviewed  Labs reviewed  NUTRITION - FOCUSED PHYSICAL EXAM:  Flowsheet Row Most Recent Value  Orbital Region Severe depletion  Upper Arm Region Severe depletion  Thoracic and Lumbar Region Severe depletion  Buccal Region Severe depletion  Temple Region Severe depletion  Clavicle Bone Region Severe depletion  Clavicle and Acromion Bone Region Severe depletion  Scapular Bone Region Severe depletion  Dorsal Hand Severe depletion  Patellar Region Severe depletion  Anterior Thigh Region Severe depletion  Posterior Calf Region Severe depletion  Edema (RD Assessment) None  Hair Reviewed  Eyes Reviewed  Mouth Reviewed  Skin Reviewed  Nails Reviewed       Diet Order:   Diet Order             Diet regular Room service appropriate? Yes; Fluid consistency: Thin  Diet effective now                   EDUCATION NEEDS:   Education needs have been addressed  Skin:  Skin Assessment: Reviewed RN Assessment  Last BM:  PTA  Height:   Ht Readings from Last 1 Encounters:  02-20-23 6' (1.829 m)    Weight:   Wt Readings from Last 1 Encounters:  01/25/23 56.7 kg   BMI:  Body mass index is 16.95 kg/m.  Estimated Nutritional Needs:   Kcal:  2000-2200  Protein:  100-115g  Fluid:  >/=2L  Drusilla Kanner, RDN, LDN Clinical Nutrition

## 2023-01-26 ENCOUNTER — Inpatient Hospital Stay (HOSPITAL_COMMUNITY): Payer: 59

## 2023-01-26 DIAGNOSIS — J939 Pneumothorax, unspecified: Secondary | ICD-10-CM | POA: Diagnosis not present

## 2023-01-26 DIAGNOSIS — J9621 Acute and chronic respiratory failure with hypoxia: Secondary | ICD-10-CM | POA: Diagnosis not present

## 2023-01-26 DIAGNOSIS — J84112 Idiopathic pulmonary fibrosis: Secondary | ICD-10-CM | POA: Diagnosis not present

## 2023-01-26 MED ORDER — NORTRIPTYLINE HCL 25 MG PO CAPS
50.0000 mg | ORAL_CAPSULE | Freq: Two times a day (BID) | ORAL | Status: DC
  Administered 2023-01-26 – 2023-02-01 (×13): 50 mg via ORAL
  Filled 2023-01-26 (×15): qty 2

## 2023-01-26 MED ORDER — DULOXETINE HCL 60 MG PO CPEP
60.0000 mg | ORAL_CAPSULE | Freq: Every day | ORAL | Status: DC
  Administered 2023-01-26 – 2023-02-01 (×7): 60 mg via ORAL
  Filled 2023-01-26 (×7): qty 1

## 2023-01-26 MED ORDER — BENZONATATE 100 MG PO CAPS
200.0000 mg | ORAL_CAPSULE | Freq: Three times a day (TID) | ORAL | Status: DC | PRN
  Administered 2023-01-29 – 2023-02-01 (×2): 200 mg via ORAL
  Filled 2023-01-26 (×2): qty 2

## 2023-01-26 MED ORDER — OXYCODONE HCL ER 10 MG PO T12A
20.0000 mg | EXTENDED_RELEASE_TABLET | Freq: Two times a day (BID) | ORAL | Status: DC
  Administered 2023-01-26 – 2023-01-27 (×3): 20 mg via ORAL
  Filled 2023-01-26 (×3): qty 2

## 2023-01-26 MED ORDER — SODIUM CHLORIDE 0.9 % IV BOLUS
500.0000 mL | Freq: Once | INTRAVENOUS | Status: AC
  Administered 2023-01-26: 500 mL via INTRAVENOUS

## 2023-01-26 MED ORDER — FENTANYL 25 MCG/HR TD PT72
1.0000 | MEDICATED_PATCH | TRANSDERMAL | Status: DC
  Administered 2023-01-26: 1 via TRANSDERMAL
  Filled 2023-01-26: qty 1

## 2023-01-26 MED ORDER — BUSPIRONE HCL 5 MG PO TABS
5.0000 mg | ORAL_TABLET | Freq: Three times a day (TID) | ORAL | Status: DC
  Administered 2023-01-26 – 2023-02-01 (×19): 5 mg via ORAL
  Filled 2023-01-26 (×20): qty 1

## 2023-01-26 MED ORDER — FAMOTIDINE 20 MG PO TABS
20.0000 mg | ORAL_TABLET | Freq: Two times a day (BID) | ORAL | Status: DC
  Administered 2023-01-26 – 2023-02-01 (×13): 20 mg via ORAL
  Filled 2023-01-26 (×13): qty 1

## 2023-01-26 MED ORDER — TRAZODONE HCL 100 MG PO TABS
100.0000 mg | ORAL_TABLET | Freq: Every evening | ORAL | Status: DC | PRN
  Administered 2023-01-26 – 2023-01-31 (×3): 100 mg via ORAL
  Filled 2023-01-26 (×3): qty 1

## 2023-01-26 MED ORDER — ALPRAZOLAM 0.25 MG PO TABS
0.2500 mg | ORAL_TABLET | Freq: Every evening | ORAL | Status: DC | PRN
  Administered 2023-01-26 – 2023-01-31 (×2): 0.25 mg via ORAL
  Filled 2023-01-26 (×2): qty 1

## 2023-01-26 MED ORDER — OXYCODONE-ACETAMINOPHEN 5-325 MG PO TABS
2.0000 | ORAL_TABLET | ORAL | Status: DC | PRN
  Administered 2023-01-28 – 2023-01-31 (×11): 2 via ORAL
  Filled 2023-01-26 (×12): qty 2

## 2023-01-26 NOTE — Progress Notes (Signed)
[facility]                                                                               Bradley Rice, is a 62 y.o. male, DOB - 1961-01-29, SAY:[id number] Admit date - 01/12/2023    Outpatient Primary MD for the patient is [name], FNP  LOS - 2  days    Brief summary   Bradley Rice is a 62 y.o. male with medical history significant for ALS, lung cancer, hypertension, idiopathic pulmonary fibrosis, chronic respiratory failure on 6 L at baseline. Patient presented to the ED with complaints of difficulty breathing, left-sided chest pain and cough that started yesterday.    Recent hospitalization 5/20 to 6/4 for recurrent left-sided pneumothorax, who was admitted at [facility] for pneumothorax and had chest tube 5/24 through 5/28.  He underwent bronchoscopy 01/02/2023 with placement of endobronchial valves.  Chest x-ray showed large left-sided pneumothorax with slight rightward shift of the mediastinum. General surgery was consulted, patient ultimately underwent chest tube placement,. EDP talked to intensivist Dr. Katrinka Blazing, recommend admission to [facility] under hospitalist service, will see in consult.   Assessment & Plan    Assessment and Plan:   * Recurrent left pneumothorax/ ACUTE Presenting with chest pain, difficulty breathing and acute chronic hypoxic respiratory failure.  Chest x-ray shows recurrent large left-sided pneumo thorax with slight rightward shift of the mediastinum.    Pneumothorax in the setting of extensive stage small cell lung cancer, COPD, and IPF. He underwent endobronchial valve placement by Dr Tonia Brooms earlier this month.  - Cardiothoracic Surgery consulted, unfortunately not a surgical candidate.   - underwent chest tube placement and repeat CXR shows near complete resolution of the pneumothorax.  PCCM consulted, plan for pleural apposition to wall tomorrow.    Endobronchial cancer, right (HCC) History of extensive stage small cell lung  cancer, currently on chemotherapy.  Follows with Dr. Ellin Saba.  Chest x-ray today shows persistent masslike consolidation in right lung base.  Acute on chronic respiratory failure with hypoxia (HCC) due to Idiopathic pulmonary fibrosis.  On 6 lit of [place] oxygen at home.  Initially required 100% oxygen, currently back on 6 lit of [place] oxygen.    Chronic pain syndrome Patient is on OxyContin 20 mg BID and oxycodone 15 mg every 6 hours prn along with fentanyl patch . Restarted home meds along with IV dilaudid and IV toradol.   H/o of ALS Therapy evaluations will be ordered.   Leukopenia and anemia of chronic disease  From chemotherapy.  Monitor.    RN Pressure Injury Documentation: Pressure Injury 12/29/22 Coccyx Upper Stage 2 -  Partial thickness loss of dermis presenting as a shallow open injury with a red, pink wound bed without slough. pin point size (Active)  12/29/22 1609  Location: Coccyx  Location Orientation: Upper  Staging: Stage 2 -  Partial thickness loss of dermis presenting as a shallow open injury with a red, pink wound bed without slough.  Wound Description (Comments): pin point size  Present on Admission: Yes  Dressing Type Foam - Lift dressing to assess site every shift 01/02/23 2000     Estimated body mass index  is 16.95 kg/m as calculated from the following:   Height as of this encounter: 6' (1.829 m).   Weight as of this encounter: 56.7 kg.  Code Status: full code DVT Prophylaxis:  SCDs Start: 2023/02/01 2345   Level of Care: Level of care: Progressive Family Communication: none at bedside  Disposition Plan:     Remains inpatient appropriate:  further management of recurrent pneumothorax.   Procedures:  None.  Consultants:   Gen surgery  PCCM.    Antimicrobials:   Anti-infectives (From admission, onward)    None        Medications  Scheduled Meds:  busPIRone  5 mg Oral TID   DULoxetine  60 mg Oral Daily   famotidine  20 mg Oral BID    feeding supplement  237 mL Oral QID   fentaNYL  1 patch Transdermal Q72H   ketorolac  15 mg Intravenous Q6H   multivitamin with minerals  1 tablet Oral Daily   nortriptyline  50 mg Oral BID   oxyCODONE  20 mg Oral Q12H   sodium chloride flush  10 mL Intrapleural Q8H   Continuous Infusions: PRN Meds:.acetaminophen **OR** acetaminophen, albuterol, ALPRAZolam, alum & mag hydroxide-simeth, benzonatate, HYDROmorphone (DILAUDID) injection, oxyCODONE-acetaminophen, polyethylene glycol, traZODone    Subjective:   Bradley Rice was seen and examined today.  Pain not well controlled as he did not receive his home meds.  Restarted meds this morning.   Objective:   Vitals:   01/26/23 0541 01/26/23 0548 01/26/23 0749 01/26/23 1156  BP:   104/69 114/79  Pulse:   91 99  Resp: (!) 22 14 13 20   Temp:   97.7 F (36.5 C) 97.7 F (36.5 C)  TempSrc:   Oral Oral  SpO2:   95% 100%  Weight:      Height:        Intake/Output Summary (Last 24 hours) at 01/26/2023 1343 Last data filed at 01/26/2023 0749 Gross per 24 hour  Intake 370 ml  Output 880 ml  Net -510 ml    Filed Weights   02/01/23 1541 01/25/23 0418 01/26/23 0538  Weight: 57.2 kg 56.7 kg 56.7 kg     Exam  General exam: Appears calm and comfortable  Respiratory system: Clear to auscultation. Respiratory effort normal. Cardiovascular system: S1 & S2 heard, RRR. Gastrointestinal system: Abdomen is nondistended, soft and nontender.  Central nervous system: Alert and oriented. No focal neurological deficits. Extremities: Symmetric 5 x 5 power. Skin: No rashes, lesions or ulcers Psychiatry: anxious.     Data Reviewed:  I have personally reviewed following labs and imaging studies   CBC Lab Results  Component Value Date   WBC 1.7 (L) 01/25/2023   RBC 3.13 (L) 01/25/2023   HGB 8.8 (L) 01/25/2023   HCT 27.9 (L) 01/25/2023   MCV 89.1 01/25/2023   MCH 28.1 01/25/2023   PLT 235 01/25/2023   MCHC 31.5 01/25/2023   RDW 14.9  01/25/2023   LYMPHSABS 1.1 01/09/2023   MONOABS 0.8 01/09/2023   EOSABS 0.2 01/09/2023   BASOSABS 0.1 01/09/2023     Last metabolic panel Lab Results  Component Value Date   NA 136 01/25/2023   K 4.5 01/25/2023   CL 98 01/25/2023   CO2 31 01/25/2023   BUN <5 (L) 01/25/2023   CREATININE 0.68 01/25/2023   GLUCOSE 104 (H) 01/25/2023   GFRNONAA >60 01/25/2023   GFRAA >60 12/15/2019   CALCIUM 8.8 (L) 01/25/2023   PHOS 3.6 12/30/2022   PROT  7.1 01/09/2023   ALBUMIN 3.1 (L) 01/09/2023   LABGLOB 3.0 11/18/2022   AGRATIO 1.2 11/18/2022   BILITOT 0.4 01/09/2023   ALKPHOS 62 01/09/2023   AST 14 (L) 01/09/2023   ALT 19 01/09/2023   ANIONGAP 7 01/25/2023    CBG (last 3)  No results for input(s): "GLUCAP" in the last 72 hours.    Coagulation Profile: No results for input(s): "INR", "PROTIME" in the last 168 hours.   Radiology Studies: DG Chest Port 1 View  Result Date: 01/26/2023 CLINICAL DATA:  Left pneumothorax.  Right lung carcinoma. EXAM: PORTABLE CHEST 1 VIEW COMPARISON:  01/26/2023 FINDINGS: Small approximately 10% left pneumothorax shows no significant change in size. Left pleural catheter remains in place. Endobronchial stent seen in the central left upper lobe. Left-sided Port-A-Cath remains in appropriate position. Right lower lobe mass and right hilar lymphadenopathy show no significant change allowing for differences in radiographic technique. Small right pleural effusion also stable. Diffuse interstitial lung disease is unchanged. IMPRESSION: No significant change in small approximately 10% left pneumothorax. Left pleural catheter remains in appropriate position. Stable right lower lobe mass, right hilar lymphadenopathy, and small right pleural effusion. Stable diffuse interstitial lung disease. Electronically Signed   By: Danae Orleans M.D.   On: 01/26/2023 08:17   DG Chest Port 1 View  Result Date: 01/17/2023 CLINICAL DATA:  Chest tube placement, left pneumothorax  EXAM: PORTABLE CHEST 1 VIEW COMPARISON:  01/29/2023 FINDINGS: Single frontal view of the chest demonstrates stable left chest wall port. Interval placement of a left chest tube with tip overlying left hilum, with near complete resolution of the left pneumothorax seen previously. Estimated less than 10% residual left apical pneumothorax, with pleural separation measuring 10 mm. Cardiac silhouette is stable. Extensive background scarring and fibrosis again noted. Rounded masslike consolidation right lung base unchanged. Small right pleural effusion versus pleural thickening. No acute fracture. IMPRESSION: 1. Near complete resolution of left pneumothorax after left chest tube placement. Estimated less than 10% residual left pneumothorax. 2. Stable background scarring and fibrosis, with persistent masslike consolidation at the right lung base. Electronically Signed   By: Sharlet Salina M.D.   On: 01/29/2023 17:32   DG Chest Port 1 View  Result Date: 01/23/2023 CLINICAL DATA:  [id number] SOB (shortness of breath) [id number] EXAM: PORTABLE CHEST - 1 VIEW COMPARISON:  01/03/2023 FINDINGS: Large left pneumothorax with slight rightward shift of the mediastinum, new since previous. Stable left subclavian port catheter to the proximal right atrium. Progressive coarse interstitial opacities throughout both lungs. 2 left suprahilar endobronchial valves as before. Heart size difficult to assess due to adjacent opacities. Blunting of the right lateral costophrenic angle suggesting small effusion. Cervical fixation hardware partially visualized. IMPRESSION: 1. Large left pneumothorax with slight rightward shift of the mediastinum. Critical Value/emergent results were called by telephone at the time of interpretation on 01/20/2023 at 4:25 pm to provider JOSEPH ZAMMIT , who verbally acknowledged these results. 2. Progressive coarse interstitial opacities throughout both lungs. Electronically Signed   By: Corlis Leak M.D.   On: 01/21/2023  16:26       Kathlen Mody M.D. [facility] 01/26/2023, 1:43 PM  Available via Epic secure chat 7am-7pm After 7 pm, please refer to night coverage provider listed on amion.

## 2023-01-26 NOTE — H&P (View-Only) (Signed)
NAME:  Bradley Rice, MRN:  [id number], DOB:  04-26-1961, LOS: 2 ADMISSION DATE:  01/11/2023, CONSULTATION DATE: 01/25/2023 REFERRING MD: [facility], CHIEF COMPLAINT: Recurrent left pneumothorax in the setting of endobronchial cancer  History of Present Illness:  Mr. Bradley Rice is a 62 year old male former smoker who quit 7 years ago he has known endobronchial cancer on the right and is currently receiving chemotherapy every 21 days last given on 01/04/2023.  He has had 3 left pneumothoraces with chest tubes times 3+ endobronchial valves placed by Dr. Tonia Brooms on the left early June or 2024.  He again presented to [facility] with chest pain and shortness of breath and was found to have a large left pneumothoraces.  He had a chest tube placed by surgical intervention at [facility].  Transferred to [facility] for further evaluation and treatment.  He is well-known to our practice.  He has never had a formal thoracic consult.  The last thoracic surgery to follow and confer with Dr. Tonia Brooms concerning further interventions.  Pertinent  Medical History   Past Medical History:  Diagnosis Date   ALS (amyotrophic lateral sclerosis) (HCC)    Asthma    Back pain    COPD (chronic obstructive pulmonary disease) (HCC)    Coronary artery calcification seen on CT scan    DDD (degenerative disc disease), lumbar    Depression    Dysphagia    Following previous surgery   History of blood transfusion 2014   Post-op bleed   History of lung cancer    History of stroke 2015   Idiopathic pulmonary fibrosis (HCC)    Migraine    Port-A-Cath in place 12/15/2022   Spider bite 2012     Significant Hospital Events: Including procedures, antibiotic start and stop dates in addition to other pertinent events   Chest tube inserted at [facility]  Interim History / Subjective:  She has poor insight into current medical issues  Objective   Blood pressure 104/69, pulse 91, temperature 97.7 F  (36.5 C), temperature source Oral, resp. rate 13, height 6' (1.829 m), weight 56.7 kg, SpO2 95 %.        Intake/Output Summary (Last 24 hours) at 01/26/2023 1054 Last data filed at 01/26/2023 0300 Gross per 24 hour  Intake 600 ml  Output 1280 ml  Net -680 ml   Filed Weights   01/14/2023 1541 01/25/23 0418 01/26/23 0538  Weight: 57.2 kg 56.7 kg 56.7 kg    Examination: Frail cachectic male no acute distress Or lymphadenopathy is appreciated Chest tube with continued air leak left Decreased breath sounds in the bases Abdomen positive bowel sounds Extremities are warm without edema  Resolved Hospital Problem list     Assessment & Plan:  Recurrent left pneumothorax in the setting of lung cancer on the right status post endobronchial valves this will be his third pneumothorax with a chest tube placement at [facility].  He had endobronchial valves placed by Dr. Tonia Brooms early June 2024.  He has not had a formal consult by cardiovascular thoracic surgery at this time. Continue chest tube to 20 cm suction he remains positive for airleak at this time. Thoracic surgery has again declined surgical intervention Possible talc pleurodesis in the future via chest tube Questionable endobronchial blocks/valves per Dr. Tonia Brooms      Idiopathic pulmonary fibrosis O2 dependent at 6 L nasal cannula Continue oxygen Liters as needed  Multiple cervical and lumbar surgical interventions with chronic pain  syndrome Pain medications as needed   History of ALS Per primary  History of depression Per primary  Best Practice (right click and "Reselect all SmartList Selections" daily)   Primary  Labs   CBC: Recent Labs  Lab 01/12/2023 1609 01/25/23 0047  WBC 1.4* 1.7*  HGB 10.2* 8.8*  HCT 32.0* 27.9*  MCV 89.4 89.1  PLT 248 235    Basic Metabolic Panel: Recent Labs  Lab 01/11/2023 1609 01/25/23 0047  NA 136 136  K 3.6 4.5  CL 97* 98  CO2 30 31  GLUCOSE 143* 104*  BUN 5* <5*   CREATININE 0.59* 0.68  CALCIUM 9.2 8.8*   GFR: Estimated Creatinine Clearance: 78.8 mL/min (by C-G formula based on SCr of 0.68 mg/dL). Recent Labs  Lab 01/15/2023 1609 01/25/23 0047  WBC 1.4* 1.7*    Liver Function Tests: No results for input(s): "AST", "ALT", "ALKPHOS", "BILITOT", "PROT", "ALBUMIN" in the last 168 hours. No results for input(s): "LIPASE", "AMYLASE" in the last 168 hours. No results for input(s): "AMMONIA" in the last 168 hours.  ABG    Component Value Date/Time   PHART 7.47 (H) 11/08/2022 1751   PCO2ART 44 11/08/2022 1751   PO2ART 71 (L) 11/08/2022 1751   HCO3 31.3 (H) 11/08/2022 1751   TCO2 24 01/24/2018 1940   O2SAT 95.8 11/08/2022 1751     Coagulation Profile: No results for input(s): "INR", "PROTIME" in the last 168 hours.  Cardiac Enzymes: No results for input(s): "CKTOTAL", "CKMB", "CKMBINDEX", "TROPONINI" in the last 168 hours.  HbA1C: HB A1C (BAYER DCA - WAIVED)  Date/Time Value Ref Range Status  05/18/2021 11:38 AM 5.6 4.8 - 5.6 % Final    Comment:             Prediabetes: 5.7 - 6.4          Diabetes: >6.4          Glycemic control for adults with diabetes: <7.0               **Please note reference interval change**   02/03/2016 03:40 PM 5.6 <7.0 % Final    Comment:                                          Diabetic Adult            <7.0                                       Healthy Adult        4.3 - 5.7                                                           (DCCT/NGSP) American Diabetes Association's Summary of Glycemic Recommendations for Adults with Diabetes: Hemoglobin A1c <7.0%. More stringent glycemic goals (A1c <6.0%) may further reduce complications at the cost of increased risk of hypoglycemia.     CBG: No results for input(s): "GLUCAP" in the last 168 hours.  Brett Canales Tamika Shropshire ACNP Acute Care Nurse Practitioner [facility]/Critical [facility] Please consult Amion 01/26/2023, 10:54 AM

## 2023-01-26 NOTE — Progress Notes (Signed)
 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  He has had 3 left pneumothoraces with chest tubes times 3+ endobronchial valves placed by Dr. Icard on the left early June or 2024.  He again presented to [facility] with chest pain and shortness of breath and was found to have a large left pneumothoraces.  He had a chest tube placed by surgical intervention at [facility].  Transferred to [facility] for further evaluation and treatment.  He is well-known to our practice.  He has never had a formal thoracic consult.  The last thoracic surgery to follow and confer with Dr. ICard concerning further interventions.  Pertinent  Medical History   Past Medical History:  Diagnosis Date   ALS (amyotrophic lateral sclerosis) (HCC)    Asthma    Back pain    COPD (chronic obstructive pulmonary disease) (HCC)    Coronary artery calcification seen on CT scan    DDD (degenerative disc disease), lumbar    Depression    Dysphagia    Following previous surgery   History of blood transfusion 2014   Post-op bleed   History of lung cancer    History of stroke 2015   Idiopathic pulmonary fibrosis (HCC)    Migraine    Port-A-Cath in place 12/15/2022   Spider bite 2012     Significant Hospital Events: Including procedures, antibiotic start and stop dates in addition to other pertinent events   Chest tube inserted at [facility]  Interim History / Subjective:  She has poor insight into current medical issues  Objective   Blood pressure 104/69, pulse 91, temperature 97.7 F  (36.5 C), temperature source Oral, resp. rate 13, height 6' (1.829 m), weight 56.7 kg, SpO2 95 %.        Intake/Output Summary (Last 24 hours) at 01/26/2023 1054 Last data filed at 01/26/2023 0300 Gross per 24 hour  Intake 600 ml  Output 1280 ml  Net -680 ml   Filed Weights   01/27/2023 1541 01/25/23 0418 01/26/23 0538  Weight: 57.2 kg 56.7 kg 56.7 kg    Examination: Frail cachectic male no acute distress Or lymphadenopathy is appreciated Chest tube with continued air leak left Decreased breath sounds in the bases Abdomen positive bowel sounds Extremities are warm without edema  Resolved Hospital Problem list     Assessment & Plan:  Recurrent left pneumothorax in the setting of lung cancer on the right status post endobronchial valves this will be his third pneumothorax with a chest tube placement at  Hospital.  He had endobronchial valves placed by Dr. Icard early June 2024.  He has not had a formal consult by cardiovascular thoracic surgery at this time. Continue chest tube to 20 cm suction he remains positive for airleak at this time. Thoracic surgery has again declined surgical intervention Possible talc pleurodesis in the future via chest tube Questionable endobronchial blocks/valves per Dr. Icard      Idiopathic pulmonary fibrosis O2 dependent at 6 L nasal cannula Continue oxygen Liters as needed  Multiple cervical and lumbar surgical interventions with chronic pain   syndrome Pain medications as needed   History of ALS Per primary  History of depression Per primary  Best Practice (right click and "Reselect all SmartList Selections" daily)   Primary  Labs   CBC: Recent Labs  Lab 01/28/2023 1609 01/25/23 0047  WBC 1.4* 1.7*  HGB 10.2* 8.8*  HCT 32.0* 27.9*  MCV 89.4 89.1  PLT 248 235    Basic Metabolic Panel: Recent Labs  Lab 01/23/2023 1609 01/25/23 0047  NA 136 136  K 3.6 4.5  CL 97* 98  CO2 30 31  GLUCOSE 143* 104*  BUN 5* <5*   CREATININE 0.59* 0.68  CALCIUM 9.2 8.8*   GFR: Estimated Creatinine Clearance: 78.8 mL/min (by C-G formula based on SCr of 0.68 mg/dL). Recent Labs  Lab 01/29/2023 1609 01/25/23 0047  WBC 1.4* 1.7*    Liver Function Tests: No results for input(s): "AST", "ALT", "ALKPHOS", "BILITOT", "PROT", "ALBUMIN" in the last 168 hours. No results for input(s): "LIPASE", "AMYLASE" in the last 168 hours. No results for input(s): "AMMONIA" in the last 168 hours.  ABG    Component Value Date/Time   PHART 7.47 (H) 11/08/2022 1751   PCO2ART 44 11/08/2022 1751   PO2ART 71 (L) 11/08/2022 1751   HCO3 31.3 (H) 11/08/2022 1751   TCO2 24 01/24/2018 1940   O2SAT 95.8 11/08/2022 1751     Coagulation Profile: No results for input(s): "INR", "PROTIME" in the last 168 hours.  Cardiac Enzymes: No results for input(s): "CKTOTAL", "CKMB", "CKMBINDEX", "TROPONINI" in the last 168 hours.  HbA1C: HB A1C (BAYER DCA - WAIVED)  Date/Time Value Ref Range Status  05/18/2021 11:38 AM 5.6 4.8 - 5.6 % Final    Comment:             Prediabetes: 5.7 - 6.4          Diabetes: >6.4          Glycemic control for adults with diabetes: <7.0               **Please note reference interval change**   02/03/2016 03:40 PM 5.6 <7.0 % Final    Comment:                                          Diabetic Adult            <7.0                                       Healthy Adult        4.3 - 5.7                                                           (DCCT/NGSP) American Diabetes Association's Summary of Glycemic Recommendations for Adults with Diabetes: Hemoglobin A1c <7.0%. More stringent glycemic goals (A1c <6.0%) may further reduce complications at the cost of increased risk of hypoglycemia.     CBG: No results for input(s): "GLUCAP" in the last 168 hours.  Steve Jeanise Durfey ACNP Acute Care Nurse Practitioner [facility]/Critical Care Please consult Amion 01/26/2023, 10:54 AM      

## 2023-01-26 NOTE — Progress Notes (Signed)
Mobility Specialist Progress Note:   01/26/23 1200  Mobility  Activity Ambulated with assistance in hallway  Level of Assistance Contact guard assist, steadying assist  Assistive Device Four wheel walker  Distance Ambulated (ft) 200 ft  Activity Response Tolerated well  Mobility Referral Yes  $Mobility charge 1 Mobility  Mobility Specialist Start Time (ACUTE ONLY) 1240  Mobility Specialist Stop Time (ACUTE ONLY) 1253  Mobility Specialist Time Calculation (min) (ACUTE ONLY) 13 min    Pre Mobility:  99% SpO2 During Mobility:  99% SpO2 Post Mobility:   97% SpO2  Pt received in bed, agreeable to mobility. Ambulated in hallway w/ rollator and CG. VSS throughout, no c/o. Pt left on EOB with call bell and family present.  D'Vante Earlene Plater Mobility Specialist Please contact via Special educational needs teacher or Rehab office at [phone]

## 2023-01-26 NOTE — Progress Notes (Signed)
Pts wife came out of the room. Stated she felt like something was wrong with the patient. Pt sleeping upon entry to the room. Wife states patient was breathing "funny". Upon my assessment pt breathing evenly unlabored on 6L HFNC. O2 sats 100%. When asked if he was ok he said no "I'm tired". Pt BP low 75/54 map and 82/52 manual. Baseline BP low 90s and low 100s and received 0.25 of xanax for anxiety and 100 mg trazodone for sleep. HR 105. MD notified. 500 cc bolus ordered and given.

## 2023-01-26 NOTE — Addendum Note (Signed)
Encounter addended by: Satira Anis, RN on: 01/26/2023 12:54 PM  Actions taken: Charge Capture section accepted

## 2023-01-27 ENCOUNTER — Encounter (HOSPITAL_COMMUNITY): Payer: Self-pay | Admitting: Internal Medicine

## 2023-01-27 ENCOUNTER — Inpatient Hospital Stay (HOSPITAL_COMMUNITY): Payer: 59

## 2023-01-27 ENCOUNTER — Telehealth: Payer: Self-pay | Admitting: Pulmonary Disease

## 2023-01-27 ENCOUNTER — Inpatient Hospital Stay (HOSPITAL_COMMUNITY): Payer: 59 | Admitting: Certified Registered Nurse Anesthetist

## 2023-01-27 ENCOUNTER — Encounter (HOSPITAL_COMMUNITY): Admission: EM | Disposition: E | Payer: Self-pay | Source: Home / Self Care | Attending: Internal Medicine

## 2023-01-27 DIAGNOSIS — C3491 Malignant neoplasm of unspecified part of right bronchus or lung: Secondary | ICD-10-CM | POA: Diagnosis not present

## 2023-01-27 DIAGNOSIS — I10 Essential (primary) hypertension: Secondary | ICD-10-CM | POA: Diagnosis not present

## 2023-01-27 DIAGNOSIS — J9383 Other pneumothorax: Secondary | ICD-10-CM | POA: Diagnosis not present

## 2023-01-27 DIAGNOSIS — Z515 Encounter for palliative care: Secondary | ICD-10-CM

## 2023-01-27 DIAGNOSIS — J939 Pneumothorax, unspecified: Secondary | ICD-10-CM | POA: Diagnosis not present

## 2023-01-27 DIAGNOSIS — Z87891 Personal history of nicotine dependence: Secondary | ICD-10-CM | POA: Diagnosis not present

## 2023-01-27 DIAGNOSIS — J449 Chronic obstructive pulmonary disease, unspecified: Secondary | ICD-10-CM | POA: Diagnosis not present

## 2023-01-27 DIAGNOSIS — J9621 Acute and chronic respiratory failure with hypoxia: Secondary | ICD-10-CM | POA: Diagnosis not present

## 2023-01-27 DIAGNOSIS — J84112 Idiopathic pulmonary fibrosis: Secondary | ICD-10-CM | POA: Diagnosis not present

## 2023-01-27 DIAGNOSIS — Z7189 Other specified counseling: Secondary | ICD-10-CM

## 2023-01-27 HISTORY — PX: VIDEO BRONCHOSCOPY: SHX5072

## 2023-01-27 HISTORY — PX: INSERTION OF IBV VALVE: SHX5091

## 2023-01-27 LAB — LACTIC ACID, PLASMA
Lactic Acid, Venous: 2.1 mmol/L (ref 0.5–1.9)
Lactic Acid, Venous: 1.4 mmol/L (ref 0.5–1.9)

## 2023-01-27 SURGERY — VIDEO BRONCHOSCOPY WITHOUT FLUORO
Anesthesia: General

## 2023-01-27 MED ORDER — OXYCODONE HCL 5 MG PO TABS: 5.0000 mg | ORAL_TABLET | Freq: Once | ORAL | Status: DC | PRN

## 2023-01-27 MED ORDER — OXYCODONE HCL 5 MG/5ML PO SOLN: 5.0000 mg | Freq: Once | ORAL | Status: DC | PRN

## 2023-01-27 MED ORDER — ONDANSETRON HCL 4 MG/2ML IJ SOLN: 4.0000 mg | Freq: Once | INTRAMUSCULAR | Status: DC | PRN

## 2023-01-27 MED ORDER — LACTATED RINGERS IV SOLN: INTRAVENOUS | Status: DC

## 2023-01-27 MED ORDER — PHENYLEPHRINE HCL-NACL 20-0.9 MG/250ML-% IV SOLN
INTRAVENOUS | Status: DC | PRN
  Administered 2023-01-27: 25 ug/min via INTRAVENOUS

## 2023-01-27 MED ORDER — CHLORHEXIDINE GLUCONATE 0.12 % MT SOLN
15.0000 mL | Freq: Once | OROMUCOSAL | Status: AC
  Administered 2023-01-27: 15 mL via OROMUCOSAL

## 2023-01-27 MED ORDER — PHENYLEPHRINE 80 MCG/ML (10ML) SYRINGE FOR IV PUSH (FOR BLOOD PRESSURE SUPPORT)
PREFILLED_SYRINGE | INTRAVENOUS | Status: DC | PRN
  Administered 2023-01-27: 80 ug via INTRAVENOUS
  Administered 2023-01-27: 160 ug via INTRAVENOUS
  Administered 2023-01-27 (×2): 80 ug via INTRAVENOUS

## 2023-01-27 MED ORDER — HYDROCORTISONE SOD SUC (PF) 100 MG IJ SOLR
100.0000 mg | Freq: Two times a day (BID) | INTRAMUSCULAR | Status: DC
  Administered 2023-01-27 – 2023-01-28 (×2): 100 mg via INTRAVENOUS
  Filled 2023-01-27 (×3): qty 2

## 2023-01-27 MED ORDER — HYDROMORPHONE HCL 1 MG/ML IJ SOLN: 0.2500 mg | INTRAMUSCULAR | Status: DC | PRN

## 2023-01-27 MED ORDER — ONDANSETRON HCL 4 MG/2ML IJ SOLN
INTRAMUSCULAR | Status: DC | PRN
  Administered 2023-01-27: 4 mg via INTRAVENOUS

## 2023-01-27 MED ORDER — ROCURONIUM BROMIDE 10 MG/ML (PF) SYRINGE
PREFILLED_SYRINGE | INTRAVENOUS | Status: DC | PRN
  Administered 2023-01-27: 30 mg via INTRAVENOUS

## 2023-01-27 MED ORDER — MIDODRINE HCL 5 MG PO TABS
5.0000 mg | ORAL_TABLET | Freq: Three times a day (TID) | ORAL | Status: DC
  Administered 2023-01-27 – 2023-02-01 (×14): 5 mg via ORAL
  Filled 2023-01-27 (×15): qty 1

## 2023-01-27 MED ORDER — CHLORHEXIDINE GLUCONATE 0.12 % MT SOLN
OROMUCOSAL | Status: AC
  Filled 2023-01-27: qty 15

## 2023-01-27 MED ORDER — LIDOCAINE 2% (20 MG/ML) 5 ML SYRINGE
INTRAMUSCULAR | Status: DC | PRN
  Administered 2023-01-27: 50 mg via INTRAVENOUS

## 2023-01-27 MED ORDER — MIDAZOLAM HCL 5 MG/5ML IJ SOLN
INTRAMUSCULAR | Status: DC | PRN
  Administered 2023-01-27: 1 mg via INTRAVENOUS

## 2023-01-27 MED ORDER — IPRATROPIUM-ALBUTEROL 0.5-2.5 (3) MG/3ML IN SOLN
RESPIRATORY_TRACT | Status: AC
  Filled 2023-01-27: qty 3

## 2023-01-27 MED ORDER — SUGAMMADEX SODIUM 200 MG/2ML IV SOLN
INTRAVENOUS | Status: DC | PRN
  Administered 2023-01-27: 150 mg via INTRAVENOUS

## 2023-01-27 MED ORDER — IPRATROPIUM-ALBUTEROL 0.5-2.5 (3) MG/3ML IN SOLN
3.0000 mL | Freq: Once | RESPIRATORY_TRACT | Status: AC
  Administered 2023-01-27: 3 mL via RESPIRATORY_TRACT

## 2023-01-27 MED ORDER — DEXAMETHASONE SODIUM PHOSPHATE 10 MG/ML IJ SOLN
INTRAMUSCULAR | Status: DC | PRN
  Administered 2023-01-27: 4 mg via INTRAVENOUS

## 2023-01-27 MED ORDER — LACTATED RINGERS IV BOLUS
1000.0000 mL | Freq: Once | INTRAVENOUS | Status: AC
  Administered 2023-01-27: 1000 mL via INTRAVENOUS

## 2023-01-27 MED ORDER — FENTANYL CITRATE (PF) 100 MCG/2ML IJ SOLN: 25.0000 ug | INTRAMUSCULAR | Status: DC | PRN

## 2023-01-27 MED ORDER — PROPOFOL 10 MG/ML IV BOLUS
INTRAVENOUS | Status: DC | PRN
  Administered 2023-01-27: 70 mg via INTRAVENOUS

## 2023-01-27 MED ORDER — PROPOFOL 500 MG/50ML IV EMUL
INTRAVENOUS | Status: DC | PRN
  Administered 2023-01-27: 150 ug/kg/min via INTRAVENOUS
  Administered 2023-01-27: 20 mg via INTRAVENOUS

## 2023-01-27 NOTE — Interval H&P Note (Signed)
History and Physical Interval Note:  01/27/2023 10:37 AM  Bradley Rice Headings  has presented today for surgery, with the diagnosis of pneumothorax.  The various methods of treatment have been discussed with the patient and family. After consideration of risks, benefits and other options for treatment, the patient has consented to  Procedure(s): VIDEO BRONCHOSCOPY WITHOUT FLUORO (N/A) INSERTION OF INTERBRONCHIAL VALVE (IBV) (Left) as a surgical intervention.  The patient's history has been reviewed, patient examined, no change in status, stable for surgery.  I have reviewed the patient's chart and labs.  Questions were answered to the patient's satisfaction.     Rachel Bo Gwendalynn Eckstrom

## 2023-01-27 NOTE — Progress Notes (Signed)
[facility]                                                                               Bradley Rice, is a 62 y.o. male, DOB - 03-22-1961, ZOX:[id number] Admit date - 01/26/2023    Outpatient Primary MD for the patient is [name], FNP  LOS - 3  days    Brief summary   Bradley Rice is a 62 y.o. male with medical history significant for ALS, lung cancer, hypertension, idiopathic pulmonary fibrosis, chronic respiratory failure on 6 L at baseline. Patient presented to the ED with complaints of difficulty breathing, left-sided chest pain and cough that started yesterday.    Recent hospitalization 5/20 to 6/4 for recurrent left-sided pneumothorax, who was admitted at [facility] for pneumothorax and had chest tube 5/24 through 5/28.  He underwent bronchoscopy 01/02/2023 with placement of endobronchial valves.  Chest x-ray showed large left-sided pneumothorax with slight rightward shift of the mediastinum. General surgery was consulted, patient ultimately underwent chest tube placement,. EDP talked to intensivist Dr. Katrinka Blazing, recommend admission to [facility] under hospitalist service, will see in consult.   Assessment & Plan    Assessment and Plan:   * Recurrent left pneumothorax/ ACUTE Presenting with chest pain, difficulty breathing and acute chronic hypoxic respiratory failure.  Chest x-ray shows recurrent large left-sided pneumo thorax with slight rightward shift of the mediastinum.    Pneumothorax in the setting of extensive stage small cell lung cancer, COPD, and IPF. He underwent endobronchial valve placement by Dr Tonia Brooms earlier this month.  - Cardiothoracic Surgery consulted, unfortunately not a surgical candidate.   - underwent chest tube placement and repeat CXR shows near complete resolution of the pneumothorax.  PCCM consulted, underwent bronchoscopy , showing stable valves, another 3 valves were placed.  The CXR shows worsening pneumothorax. The chest tube  was put on suction. Repeat CXR shows slight improvement.  Possible talc plerodesis in the future as per pulmonology.   Endobronchial cancer, right (HCC) History of extensive stage small cell lung cancer, currently on chemotherapy.  Follows with Dr. Ellin Saba.  Chest x-ray today shows persistent masslike consolidation in right lung base.  Acute on chronic respiratory failure with hypoxia (HCC) due to Idiopathic pulmonary fibrosis.  On 6 lit of [place] oxygen at home.  Initially required 100% oxygen, currently back on 6 lit of [place] oxygen.    Chronic pain syndrome Patient is on OxyContin 20 mg BID and oxycodone 15 mg every 6 hours prn along with fentanyl patch  at home, which were restarted, but with drop in BP, stopped the fentanyl patch.  Restarted home meds along with IV dilaudid and IV toradol.   H/o of ALS Therapy evaluations will be ordered.   Leukopenia and anemia of chronic disease  From chemotherapy.  Monitor.   Hypotension of unclear etiology Will start him on midodrine.  BP running low despite fluid bolus. Will add IV hydrocortisone.    RN Pressure Injury Documentation: Pressure Injury 12/29/22 Coccyx Upper Stage 2 -  Partial thickness loss of dermis presenting as a shallow open injury with a red, pink wound bed without slough. pin point size (Active)  12/29/22 1609  Location: Coccyx  Location Orientation: Upper  Staging: Stage 2 -  Partial thickness loss of dermis presenting as a shallow open injury with a red, pink wound bed without slough.  Wound Description (Comments): pin point size  Present on Admission: Yes  Dressing Type Foam - Lift dressing to assess site every shift 01/02/23 2000     Estimated body mass index is 16.95 kg/m as calculated from the following:   Height as of this encounter: 6' (1.829 m).   Weight as of this encounter: 56.7 kg.  Code Status: full code DVT Prophylaxis:  SCDs Start: 01/16/2023 2345   Level of Care: Level of care:  Progressive Family Communication: none at bedside  Disposition Plan:     Remains inpatient appropriate:  further management of recurrent pneumothorax.   Procedures:  None.  Consultants:   Gen surgery  PCCM.    Antimicrobials:   Anti-infectives (From admission, onward)    None        Medications  Scheduled Meds:  busPIRone  5 mg Oral TID   DULoxetine  60 mg Oral Daily   famotidine  20 mg Oral BID   feeding supplement  237 mL Oral QID   fentaNYL  1 patch Transdermal Q72H   ipratropium-albuterol       ketorolac  15 mg Intravenous Q6H   multivitamin with minerals  1 tablet Oral Daily   nortriptyline  50 mg Oral BID   oxyCODONE  20 mg Oral Q12H   sodium chloride flush  10 mL Intrapleural Q8H   Continuous Infusions: PRN Meds:.acetaminophen **OR** acetaminophen, albuterol, ALPRAZolam, alum & mag hydroxide-simeth, benzonatate, HYDROmorphone (DILAUDID) injection, ipratropium-albuterol, oxyCODONE-acetaminophen, polyethylene glycol, traZODone    Subjective:   Bradley Rice was seen and examined today.   Lethargic , but answering all questions.   Objective:   Vitals:   01/27/23 1200 01/27/23 1215 01/27/23 1230 01/27/23 1345  BP: (!) 108/95 98/64 98/66  (!) 81/53  Pulse: (!) 106 (!) 107 (!) 111 (!) 102  Resp: 17 18 20 15   Temp:   (!) 97.5 F (36.4 C)   TempSrc:      SpO2: 99% 100% 100% 100%  Weight:      Height:        Intake/Output Summary (Last 24 hours) at 01/27/2023 1758 Last data filed at 01/27/2023 1612 Gross per 24 hour  Intake 1801.04 ml  Output 390 ml  Net 1411.04 ml    Filed Weights   01/16/2023 1541 01/25/23 0418 01/26/23 0538  Weight: 57.2 kg 56.7 kg 56.7 kg     Exam General exam: ill appearing gentleman, not in distress.  Respiratory system: diminished air entry on the left , s/p chest tube on suction  Cardiovascular system: S1 & S2 heard, RRR. No JVD,  Gastrointestinal system: Abdomen is nondistended, soft and nontender.  Central nervous  system: Alert and oriented. Grossly non focal.  Extremities: no pedal edema.  Skin: No rashes,  Psychiatry: calm.      Data Reviewed:  I have personally reviewed following labs and imaging studies   CBC Lab Results  Component Value Date   WBC 1.7 (L) 01/25/2023   RBC 3.13 (L) 01/25/2023   HGB 8.8 (L) 01/25/2023   HCT 27.9 (L) 01/25/2023   MCV 89.1 01/25/2023   MCH 28.1 01/25/2023   PLT 235 01/25/2023   MCHC 31.5 01/25/2023   RDW 14.9 01/25/2023   LYMPHSABS 1.1 01/09/2023   MONOABS 0.8 01/09/2023   EOSABS 0.2 01/09/2023  BASOSABS 0.1 01/09/2023     Last metabolic panel Lab Results  Component Value Date   NA 136 01/25/2023   K 4.5 01/25/2023   CL 98 01/25/2023   CO2 31 01/25/2023   BUN <5 (L) 01/25/2023   CREATININE 0.68 01/25/2023   GLUCOSE 104 (H) 01/25/2023   GFRNONAA >60 01/25/2023   GFRAA >60 12/15/2019   CALCIUM 8.8 (L) 01/25/2023   PHOS 3.6 12/30/2022   PROT 7.1 01/09/2023   ALBUMIN 3.1 (L) 01/09/2023   LABGLOB 3.0 11/18/2022   AGRATIO 1.2 11/18/2022   BILITOT 0.4 01/09/2023   ALKPHOS 62 01/09/2023   AST 14 (L) 01/09/2023   ALT 19 01/09/2023   ANIONGAP 7 01/25/2023    CBG (last 3)  No results for input(s): "GLUCAP" in the last 72 hours.    Coagulation Profile: No results for input(s): "INR", "PROTIME" in the last 168 hours.   Radiology Studies: DG CHEST PORT 1 VIEW  Result Date: 01/27/2023 CLINICAL DATA:  Pneumothorax EXAM: PORTABLE CHEST 1 VIEW COMPARISON:  01/27/2023 FINDINGS: Decreased size of previously noted left pneumothorax, now small, similar to the 01/26/2023 exam. No significant residual mediastinal shift. Redemonstrated right-sided pleural effusion. Overall stable diffuse pulmonary opacities, right greater than left, with right lower lung mass and right hilar lymphadenopathy. Redemonstrated left pleural catheter. No acute osseous abnormality. IMPRESSION: 1. Decreased size of previously noted left pneumothorax, now small, similar to  the 01/26/2023 exam. No significant residual mediastinal shift. 2. Redemonstrated right-sided pleural effusion, diffuse interstitial lung disease, and right lower lobe mass. Electronically Signed   By: Wiliam Ke M.D.   On: 01/27/2023 14:28   DG CHEST PORT 1 VIEW  Result Date: 01/27/2023 CLINICAL DATA:  Air leak. EXAM: PORTABLE CHEST 1 VIEW COMPARISON:  X-ray 01/26/2023 FINDINGS: Increasing now large left-sided pneumothorax. There is a catheter in place. Increasing opacity of both lungs with some shift of the mediastinum from left-to-right. Right-sided effusion again seen. Normal cardiopericardial silhouette. Bronchial valves identified along the left hilum. Stable left IJ chest port. Overlapping cardiac leads. IMPRESSION: Increasing now large left-sided pneumothorax with slight shift of the mediastinum from left-to-right. Increasing bilateral lung opacities. Critical Value/emergent results were called by telephone at the time of interpretation on 01/27/2023 at 10.21 Am PST to provider Dr. Blake Divine, Who verbally acknowledged these results. Electronically Signed   By: Karen Kays M.D.   On: 01/27/2023 13:21   DG Chest Port 1 View  Result Date: 01/26/2023 CLINICAL DATA:  Left pneumothorax.  Right lung carcinoma. EXAM: PORTABLE CHEST 1 VIEW COMPARISON:  01/14/2023 FINDINGS: Small approximately 10% left pneumothorax shows no significant change in size. Left pleural catheter remains in place. Endobronchial stent seen in the central left upper lobe. Left-sided Port-A-Cath remains in appropriate position. Right lower lobe mass and right hilar lymphadenopathy show no significant change allowing for differences in radiographic technique. Small right pleural effusion also stable. Diffuse interstitial lung disease is unchanged. IMPRESSION: No significant change in small approximately 10% left pneumothorax. Left pleural catheter remains in appropriate position. Stable right lower lobe mass, right hilar lymphadenopathy,  and small right pleural effusion. Stable diffuse interstitial lung disease. Electronically Signed   By: Danae Orleans M.D.   On: 01/26/2023 08:17       Kathlen Mody M.D. [facility] 01/27/2023, 5:58 PM  Available via Epic secure chat 7am-7pm After 7 pm, please refer to night coverage provider listed on amion.

## 2023-01-27 NOTE — Progress Notes (Signed)
Pt. With Lactic Acid of 2.1. On call for [facility] paged to make aware.

## 2023-01-27 NOTE — Progress Notes (Signed)
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  He has had 3 left pneumothoraces with chest tubes times 3+ endobronchial valves placed by Dr. Tonia Brooms on the left early June or 2024.  He again presented to [facility] with chest pain and shortness of breath and was found to have a large left pneumothoraces.  He had a chest tube placed by surgical intervention at [facility].  Transferred to [facility] for further evaluation and treatment.  He is well-known to our practice.  He has never had a formal thoracic consult.  The last thoracic surgery to follow and confer with Dr. Tonia Brooms concerning further interventions.  Pertinent  Medical History   Past Medical History:  Diagnosis Date   ALS (amyotrophic lateral sclerosis) (HCC)    Asthma    Back pain    COPD (chronic obstructive pulmonary disease) (HCC)    Coronary artery calcification seen on CT scan    DDD (degenerative disc disease), lumbar    Depression    Dysphagia    Following previous surgery   History of blood transfusion 2014   Post-op bleed   History of lung cancer    History of stroke 2015   Idiopathic pulmonary fibrosis (HCC)    Migraine    Port-A-Cath in place 12/15/2022   Spider bite 2012     Significant Hospital Events: Including procedures, antibiotic start and stop dates in addition to other pertinent events   Chest tube inserted at [facility] 6/28 to endo, 3 valves placed in LLL  Interim History / Subjective:  3 valves placed earlier today, did well initially. Upon return to floor, had repeat  Xray showing large L PTX. Chest tube was on water seal. Placed to suction with repeat CXR ordered. Had slightly lower BP (chronically low per RN with SBP in 80s). I evaluated him at bedside and personally evaluated repeat CXR which shows lung re-expansion. He is on 20cm suction from left chest tube and will remain on such with repeat CXR tomorrow morning.  Objective   Blood pressure 98/66, pulse (!) 111, temperature (!) 97.5 F (36.4 C), resp. rate 20, height 6' (1.829 m), weight 56.7 kg, SpO2 100 %.        Intake/Output Summary (Last 24 hours) at 01/27/2023 1429 Last data filed at 01/27/2023 1300 Gross per 24 hour  Intake 990 ml  Output 390 ml  Net 600 ml    Filed Weights   01/13/2023 1541 01/25/23 0418 01/26/23 0538  Weight: 57.2 kg 56.7 kg 56.7 kg    Examination: Frail cachectic male no acute distress but slightly sedate after procedure Chest tube with continued air leak left on 20cm suction Normal resp effort. Decreased breath sounds in the bases bilaterally Abdomen positive bowel sounds Extremities are warm without edema   Assessment & Plan:   Recurrent left pneumothorax in the setting of lung cancer status post endobronchial valves this will be his third pneumothorax with a chest tube placement at [facility].  He had endobronchial valves placed by Dr. Tonia Brooms early June 2024.  He has not had a  formal consult by cardiovascular thoracic surgery at this time. He is now s/p 3 additional valves in LLL on 01/26/23 (Dr. Tonia Brooms) Continue chest tube to 20 cm suction Repeat CXR in AM 6/29 Thoracic surgery has again declined surgical intervention Possible talc pleurodesis in the future via chest tube if can obtain pleural apposition which might prove to be tricky  Idiopathic pulmonary fibrosis O2 dependent at 6 L nasal cannula Continue oxygen as needed  Multiple cervical and lumbar surgical interventions with chronic pain syndrome Pain medications as needed  Rest per primary  team.  Best Practice (right click and "Reselect all SmartList Selections" daily)  Primary   Bradley Guys, PA - [facility] For pager details, please see AMION or use Epic chat  After 1900, please call [facility] for cross coverage needs 01/27/2023, 2:33 PM

## 2023-01-27 NOTE — Transfer of Care (Signed)
Immediate Anesthesia Transfer of Care Note  Patient: Bradley Rice  Procedure(s) Performed: VIDEO BRONCHOSCOPY WITHOUT FLUORO INSERTION OF INTERBRONCHIAL VALVE (IBV) (Left)  Patient Location: PACU  Anesthesia Type:General  Level of Consciousness: drowsy  Airway & Oxygen Therapy: Patient Spontanous Breathing and Patient connected to face mask oxygen  Post-op Assessment: Report given to RN and Post -op Vital signs reviewed and stable  Post vital signs: Reviewed and stable  Last Vitals:  Vitals Value Taken Time  BP 97/81 01/27/23 1139  Temp    Pulse 100 01/27/23 1142  Resp 9 01/27/23 1142  SpO2 96 % 01/27/23 1142  Vitals shown include unvalidated device data.  Last Pain:  Vitals:   01/27/23 1003  TempSrc: Temporal  PainSc: 7       Patients Stated Pain Goal: 5 (01/26/23 2035)  Complications: No notable events documented.

## 2023-01-27 NOTE — Op Note (Addendum)
Video Bronchoscopy Procedure Note with endobronchial valve placement.  Date of Operation: 01/27/2023  Pre-op Diagnosis: Persistent Pneumothorax  Post-op Diagnosis: Persistent Pneumothorax   Surgeon: Josephine Igo, DO  Assistants: none  Anesthesia: conscious sedation, moderate sedation  Operation: Flexible video fiberoptic bronchoscopy and biopsies.  Estimated Blood Loss: <0 cc  Complications: none noted  Indications and History: Bradley Rice is 62 y.o. with history of persistent recurrent pneumothorax.  Recommendation was to perform video fiberoptic bronchoscopy with biopsies. The risks, benefits, complications, treatment options and expected outcomes were discussed with the patient.  The possibilities of pneumothorax, pneumonia, reaction to medication, pulmonary aspiration, perforation of a viscus, bleeding, failure to diagnose a condition and creating a complication requiring transfusion or operation were discussed with the patient who freely signed the consent.    Description of Procedure: The patient was seen in the Preoperative Area, was examined and was deemed appropriate to proceed.  The patient was taken to [facility], identified as Bradley Rice and the procedure verified as Flexible Video Fiberoptic Bronchoscopy.  A Time Out was held and the above information confirmed.   Conscious sedation was initiated as indicated above. The video fiberoptic bronchoscope was introduced via the L nare and a general inspection was performed which showed normal cords, normal trachea, normal main carina. The R sided airways were inspected and showed normal or with submucosal tumor infiltration within the right mainstem distal into the right lower lobe, the right upper lobe airways appeared normal.. The L side was then inspected.  Left lung appeared normal, dilated distal subsegments.  There was 2 visible previously implanted valves to the left upper lobe segments.  Those appear normal and are seated  appropriately.  They have not migrated.  Using a Fogarty balloon we occluded the lingula there was no change in the persistent air leak.  We then used a Fogarty balloon to occlude the left lower lobe anterior medial and posterior segments there was improvement in the persistent air leak to just intermittent, less than grade 1 and would only appear at that point with respiration.  There was no continuous leak with total occlusion.  We were able to leave the lateral segment of the left lower lobe patent.  Additional closure of this did not show any change in the continuous leak.  Our goal here today was to try to place the least amount of valves possible.  Using the measuring catheter and balloon we measured the anterior medial and posterior segments.  We placed a 7 mm valve into the anterior segment, followed by a 9 mm valve into the posterior segment and a another 7 mm valve into the medial basilar segment.  Patient had apparent anatomy which appeared to have a lateral subsegment and to the medial segment was grouped with the anterior posterior instead of the traditional ALP.  Patient placed now on waterseal with no significant leak present.  Samples: None   Plans:  Outpatient followup will be with Dr. Tonia Brooms in approximately 6 weeks.  At that point we can discuss consideration for removal versus retention of the bowels for lung volume reduction if the patient is breathing stabilized.  Josephine Igo, DO [facility] 01/27/2023 11:26 AM

## 2023-01-27 NOTE — Telephone Encounter (Signed)
Dr. Chales Abrahams is asking for you to call him back at [phone]. This is in regards to his CXR

## 2023-01-27 NOTE — Plan of Care (Signed)

## 2023-01-27 NOTE — Anesthesia Postprocedure Evaluation (Signed)
Anesthesia Post Note  Patient: Bradley Rice  Procedure(s) Performed: VIDEO BRONCHOSCOPY WITHOUT FLUORO INSERTION OF INTERBRONCHIAL VALVE (IBV) (Left)     Patient location during evaluation: PACU Anesthesia Type: General Level of consciousness: awake and alert, oriented and patient cooperative Pain management: pain level controlled Vital Signs Assessment: post-procedure vital signs reviewed and stable Respiratory status: spontaneous breathing, nonlabored ventilation and respiratory function stable Cardiovascular status: blood pressure returned to baseline and stable Postop Assessment: no apparent nausea or vomiting Anesthetic complications: no   No notable events documented.  Last Vitals:  Vitals:   01/27/23 1200 01/27/23 1215  BP: (!) 108/95 98/64  Pulse: (!) 106 (!) 107  Resp: 17 18  Temp:    SpO2: 99% 100%    Last Pain:  Vitals:   01/27/23 1200  TempSrc:   PainSc: 0-No pain                 Lannie Fields

## 2023-01-27 NOTE — Anesthesia Procedure Notes (Signed)
Procedure Name: Intubation Date/Time: 01/27/2023 10:53 AM  Performed by: Waynard Edwards, CRNAPre-anesthesia Checklist: Patient identified, Emergency Drugs available, Suction available and Patient being monitored Patient Re-evaluated:Patient Re-evaluated prior to induction Oxygen Delivery Method: Circle system utilized Preoxygenation: Pre-oxygenation with 100% oxygen Induction Type: IV induction Ventilation: Mask ventilation without difficulty Laryngoscope Size: Glidescope and 4 Grade View: Grade I Tube type: Oral Tube size: 8.5 mm Number of attempts: 1 Airway Equipment and Method: Rigid stylet and Video-laryngoscopy Placement Confirmation: ETT inserted through vocal cords under direct vision, positive ETCO2 and breath sounds checked- equal and bilateral Secured at: 23 cm Tube secured with: Tape Dental Injury: Teeth and Oropharynx as per pre-operative assessment

## 2023-01-27 NOTE — Anesthesia Preprocedure Evaluation (Signed)
Anesthesia Evaluation    Reviewed: Allergy & Precautions, Patient's Chart, lab work & pertinent test results  Airway Mallampati: II  TM Distance: >3 FB Neck ROM: Full    Dental no notable dental hx.    Pulmonary asthma , COPD,  COPD inhaler and oxygen dependent, former smoker Recurrent left pneumothorax/ ACUTE Presenting with chest pain, difficulty breathing and acute chronic hypoxic respiratory failure.  Chest x-ray shows recurrent large left-sided pneumo thorax with slight rightward shift of the mediastinum.    Pneumothorax in the setting of extensive stage small cell lung cancer, COPD, and IPF.  On 6lpm [place] at home   Pulmonary exam normal breath sounds clear to auscultation       Cardiovascular hypertension, Normal cardiovascular exam Rhythm:Regular Rate:Normal     Neuro/Psych  Headaches PSYCHIATRIC DISORDERS Anxiety Depression       GI/Hepatic negative GI ROS, Neg liver ROS,,,  Endo/Other  negative endocrine ROS    Renal/GU negative Renal ROS  negative genitourinary   Musculoskeletal  (+) Arthritis , Osteoarthritis,  Chronic pain syndrome Patient is on OxyContin 20 mg BID and oxycodone 15 mg every 6 hours prn along with fentanyl patch    Abdominal   Peds  Hematology  (+) Blood dyscrasia, anemia Hb 88, plt 235   Anesthesia Other Findings   Reproductive/Obstetrics negative OB ROS                             Anesthesia Physical Anesthesia Plan  ASA: 4  Anesthesia Plan: General   Post-op Pain Management: Tylenol PO (pre-op)*   Induction: Intravenous  PONV Risk Score and Plan: 2 and Ondansetron, Dexamethasone, Midazolam and Treatment may vary due to age or medical condition  Airway Management Planned: Oral ETT  Additional Equipment: None  Intra-op Plan:   Post-operative Plan: Extubation in OR  Informed Consent: I have reviewed the patients History and Physical, chart, labs and  discussed the procedure including the risks, benefits and alternatives for the proposed anesthesia with the patient or authorized representative who has indicated his/her understanding and acceptance.     Dental advisory given  Plan Discussed with: CRNA  Anesthesia Plan Comments: (Concern for very difficult time ventilating given endobronchial ca in conjunction with PTX )       Anesthesia Quick Evaluation

## 2023-01-28 ENCOUNTER — Inpatient Hospital Stay (HOSPITAL_COMMUNITY): Payer: 59

## 2023-01-28 DIAGNOSIS — J939 Pneumothorax, unspecified: Secondary | ICD-10-CM | POA: Diagnosis not present

## 2023-01-28 DIAGNOSIS — C3491 Malignant neoplasm of unspecified part of right bronchus or lung: Secondary | ICD-10-CM | POA: Diagnosis not present

## 2023-01-28 DIAGNOSIS — J9621 Acute and chronic respiratory failure with hypoxia: Secondary | ICD-10-CM | POA: Diagnosis not present

## 2023-01-28 DIAGNOSIS — J84112 Idiopathic pulmonary fibrosis: Secondary | ICD-10-CM | POA: Diagnosis not present

## 2023-01-28 LAB — CBC WITH DIFFERENTIAL/PLATELET
Abs Immature Granulocytes: 0 10*3/uL (ref 0.00–0.07)
Basophils Absolute: 0 10*3/uL (ref 0.0–0.1)
Basophils Relative: 1 %
Eosinophils Absolute: 0 10*3/uL (ref 0.0–0.5)
Eosinophils Relative: 0 %
HCT: 26.2 % — ABNORMAL LOW (ref 39.0–52.0)
Hemoglobin: 8.1 g/dL — ABNORMAL LOW (ref 13.0–17.0)
Lymphocytes Relative: 14 %
Lymphs Abs: 0.4 10*3/uL — ABNORMAL LOW (ref 0.7–4.0)
MCH: 28 pg (ref 26.0–34.0)
MCHC: 30.9 g/dL (ref 30.0–36.0)
MCV: 90.7 fL (ref 80.0–100.0)
Monocytes Absolute: 0.4 10*3/uL (ref 0.1–1.0)
Monocytes Relative: 13 %
Neutro Abs: 1.9 10*3/uL (ref 1.7–7.7)
Neutrophils Relative %: 72 %
Platelets: 411 10*3/uL — ABNORMAL HIGH (ref 150–400)
RBC: 2.89 MIL/uL — ABNORMAL LOW (ref 4.22–5.81)
RDW: 15.3 % (ref 11.5–15.5)
WBC: 2.7 10*3/uL — ABNORMAL LOW (ref 4.0–10.5)
nRBC: 0 % (ref 0.0–0.2)
nRBC: 0 /100 WBC

## 2023-01-28 LAB — BASIC METABOLIC PANEL
Anion gap: 5 (ref 5–15)
BUN: 7 mg/dL (ref 6–20)
CO2: 37 mmol/L — ABNORMAL HIGH (ref 22–32)
Calcium: 9.1 mg/dL (ref 8.9–10.3)
Chloride: 99 mmol/L (ref 98–111)
Creatinine, Ser: 0.59 mg/dL — ABNORMAL LOW (ref 0.61–1.24)
GFR, Estimated: >60 mL/min (ref >60)
Glucose, Bld: 180 mg/dL — ABNORMAL HIGH (ref 70–99)
Potassium: 4.2 mmol/L (ref 3.5–5.1)
Sodium: 141 mmol/L (ref 135–145)

## 2023-01-28 LAB — CULTURE, BLOOD (ROUTINE X 2): Culture: NO GROWTH

## 2023-01-28 MED ORDER — ORAL CARE MOUTH RINSE: 15.0000 mL | OROMUCOSAL | Status: DC | PRN

## 2023-01-28 NOTE — Progress Notes (Signed)
Patient placed back on suction at this time per Dr. Tonia Brooms.

## 2023-01-28 NOTE — Progress Notes (Signed)
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  He has had 3 left pneumothoraces with chest tubes times 3+ endobronchial valves placed by Dr. Tonia Brooms on the left early June or 2024.  He again presented to [facility] with chest pain and shortness of breath and was found to have a large left pneumothoraces.  He had a chest tube placed by surgical intervention at [facility].  Transferred to [facility] for further evaluation and treatment.  He is well-known to our practice.  He has never had a formal thoracic consult.  The last thoracic surgery to follow and confer with Dr. Tonia Brooms concerning further interventions.  Pertinent  Medical History   Past Medical History:  Diagnosis Date   ALS (amyotrophic lateral sclerosis) (HCC)    Asthma    Back pain    COPD (chronic obstructive pulmonary disease) (HCC)    Coronary artery calcification seen on CT scan    DDD (degenerative disc disease), lumbar    Depression    Dysphagia    Following previous surgery   History of blood transfusion 2014   Post-op bleed   History of lung cancer    History of stroke 2015   Idiopathic pulmonary fibrosis (HCC)    Migraine    Port-A-Cath in place 12/15/2022   Spider bite 2012     Significant Hospital Events: Including procedures, antibiotic start and stop dates in addition to other pertinent events   Chest tube inserted at [facility] 6/28 to endo, 3 valves placed in LLL  Interim History / Subjective:   Stable. Still with airleak on suction   Objective   Blood pressure 118/74,  pulse 95, temperature 97.7 F (36.5 C), temperature source Oral, resp. rate 15, height 6' (1.829 m), weight 60.5 kg, SpO2 100 %.        Intake/Output Summary (Last 24 hours) at 01/28/2023 1043 Last data filed at 01/28/2023 0830 Gross per 24 hour  Intake 1567.04 ml  Output 800 ml  Net 767.04 ml   Filed Weights   01/25/23 0418 01/26/23 0538 01/28/23 0500  Weight: 56.7 kg 56.7 kg 60.5 kg    Examination: Frail cachetic male  Low muscle mass  Diminshed breath sounds BL  RRR s1 s2  Soft nt nd    Assessment & Plan:   Recurrent left pneumothorax in the setting of lung cancer status post endobronchial valves this will be his third pneumothorax with a chest tube placement at [facility].  He had endobronchial valves placed by Dr. Tonia Brooms early June 2024.  He has not had a formal consult by cardiovascular thoracic surgery at this time. He is now s/p 3 additional valves in LLL on 01/26/23 (Dr. Tonia Brooms) P: We dont have many options here  He is tolerating the loss of several segments on the left at this point  The only thing open in the LLL lateral and the lingular segments  He still has an airleak, but it is much better I have placed him on waterseal  I suspect he will have some portion of loculated pneumo due to his  structural lung disease and not being able to reexpand The continuous suction will cause the defect to remain open We need to try to keep him off positive pressure (vent and bipap) and keep him off suction as long as he tolerates it   Idiopathic pulmonary fibrosis O2 dependent at 6 L nasal cannula Continue oxygen as needed  Multiple cervical and lumbar  Small cell carcinoma  -op follow up  GOC: He needs ongoing goals of care discussions   Rest per primary team.  Best Practice (right click and "Reselect all SmartList Selections" daily)   Josephine Igo, DO [facility] 01/28/2023 11:14 AM

## 2023-01-28 NOTE — Progress Notes (Signed)
Order placed for patient to be water seal. CXR to be done at 1300. Upon walking into room, chest tube already to water seal. Was informed by patient that provided placed him to water seal. Received call from radiology- confirmed that provider ordered xray for 1300.

## 2023-01-28 NOTE — Progress Notes (Signed)
[facility]                                                                               Bradley Rice, is a 62 y.o. male, DOB - [birth year], ZOX:[id number] Admit date - 01/03/2023    Outpatient Primary MD for the patient is [name], FNP  LOS - 4  days    Brief summary   Bradley Rice is a 62 y.o. male with medical history significant for ALS, lung cancer, hypertension, idiopathic pulmonary fibrosis, chronic respiratory failure on 6 L at baseline. Patient presented to the ED with complaints of difficulty breathing, left-sided chest pain and cough that started yesterday.    Recent hospitalization 5/20 to 6/4 for recurrent left-sided pneumothorax, who was admitted at [facility] for pneumothorax and had chest tube 5/24 through 5/28.  He underwent bronchoscopy 01/02/2023 with placement of endobronchial valves.  Chest x-ray showed large left-sided pneumothorax with slight rightward shift of the mediastinum. General surgery was consulted, patient ultimately underwent chest tube placement,. EDP talked to intensivist Dr. Katrinka Blazing, recommend admission to [facility] under hospitalist service, will see in consult.   Assessment & Plan    Assessment and Plan:   * Recurrent left pneumothorax Presenting with chest pain, difficulty breathing and acute chronic hypoxic respiratory failure.  Chest x-ray shows recurrent large left-sided pneumo thorax with slight rightward shift of the mediastinum.    Pneumothorax in the setting of extensive stage small cell lung cancer, COPD, and IPF. He underwent endobronchial valve placement by Dr Tonia Brooms earlier this month.  - Cardiothoracic Surgery consulted, unfortunately not a surgical candidate.   - underwent chest tube placement  on admission.  PCCM consulted, underwent bronchoscopy , showing stable valves, another 3 valves were placed.  He continues to have air leak. Repeat CXR ordered for this afternoon.    Endobronchial cancer, right  (HCC) History of extensive stage small cell lung cancer, currently on chemotherapy.  Follows with Dr. Ellin Saba.  Chest x-ray today shows persistent masslike consolidation in right lung base.  Acute on chronic respiratory failure with hypoxia (HCC) due to Idiopathic pulmonary fibrosis.  On 6 lit of [place] oxygen at home.  Initially required 100% oxygen, currently back on 6 lit of [place] oxygen.    Chronic pain syndrome Patient is on OxyContin 20 mg BID and oxycodone 15 mg every 6 hours prn along with fentanyl patch  at home, which were restarted, but with drop in BP, stopped the fentanyl patch.  Restarted home meds along with IV dilaudid and IV toradol.   H/o of ALS Therapy evaluations will be ordered.   Leukopenia and anemia of chronic disease  From chemotherapy.  Monitor.   Hypotension of unclear etiology BP parameters improved with midodrine and two doses of hydrocortisone. Will d/c stress dose steroids.    RN Pressure Injury Documentation: Pressure Injury 12/29/22 Coccyx Upper Stage 2 -  Partial thickness loss of dermis presenting as a shallow open injury with a red, pink wound bed without slough. pin point size (Active)  12/29/22 1609  Location: Coccyx  Location Orientation: Upper  Staging: Stage 2 -  Partial thickness loss of dermis presenting as a  shallow open injury with a red, pink wound bed without slough.  Wound Description (Comments): pin point size  Present on Admission: Yes  Dressing Type Foam - Lift dressing to assess site every shift 01/02/23 2000   Thrombocytosis:  Reactive.     Lactic acidosis:  Resolved.    Estimated body mass index is 18.09 kg/m as calculated from the following:   Height as of this encounter: 6' (1.829 m).   Weight as of this encounter: 60.5 kg.  Code Status: full code DVT Prophylaxis:  SCDs Start: 01/11/2023 2345   Level of Care: Level of care: Progressive Family Communication: none at bedside  Disposition Plan:     Remains inpatient  appropriate:  further management of recurrent pneumothorax.   Procedures:  None.  Consultants:   Gen surgery  PCCM.    Antimicrobials:   Anti-infectives (From admission, onward)    None        Medications  Scheduled Meds:  busPIRone  5 mg Oral TID   DULoxetine  60 mg Oral Daily   famotidine  20 mg Oral BID   feeding supplement  237 mL Oral QID   ketorolac  15 mg Intravenous Q6H   midodrine  5 mg Oral TID WC   multivitamin with minerals  1 tablet Oral Daily   nortriptyline  50 mg Oral BID   sodium chloride flush  10 mL Intrapleural Q8H   Continuous Infusions: PRN Meds:.acetaminophen **OR** acetaminophen, albuterol, ALPRAZolam, alum & mag hydroxide-simeth, benzonatate, HYDROmorphone (DILAUDID) injection, oxyCODONE-acetaminophen, polyethylene glycol, traZODone    Subjective:   Bradley Rice was seen and examined today.   Lethargic , but answering all questions.   Objective:   Vitals:   01/28/23 0500 01/28/23 0734 01/28/23 1146 01/28/23 1200  BP:  118/74 132/77 125/77  Pulse:  95 (!) 111 (!) 110  Resp:  15 19 18   Temp:  97.7 F (36.5 C) 98 F (36.7 C) 98.1 F (36.7 C)  TempSrc:  Oral Oral Oral  SpO2:  100% 100% 100%  Weight: 60.5 kg     Height:        Intake/Output Summary (Last 24 hours) at 01/28/2023 1449 Last data filed at 01/28/2023 1311 Gross per 24 hour  Intake 1624.04 ml  Output 1025 ml  Net 599.04 ml    Filed Weights   01/25/23 0418 01/26/23 0538 01/28/23 0500  Weight: 56.7 kg 56.7 kg 60.5 kg     Exam General exam: ill appearing gentleman, not in distress.  Respiratory system: diminished air entry on the left , s/p chest tube on suction  Cardiovascular system: S1 & S2 heard, RRR. No JVD,  Gastrointestinal system: Abdomen is nondistended, soft and nontender.  Central nervous system: Alert and oriented. Grossly non focal.  Extremities: no pedal edema.  Skin: No rashes,  Psychiatry: calm.      Data Reviewed:  I have personally reviewed  following labs and imaging studies   CBC Lab Results  Component Value Date   WBC 2.7 (L) 01/28/2023   RBC 2.89 (L) 01/28/2023   HGB 8.1 (L) 01/28/2023   HCT 26.2 (L) 01/28/2023   MCV 90.7 01/28/2023   MCH 28.0 01/28/2023   PLT 411 (H) 01/28/2023   MCHC 30.9 01/28/2023   RDW 15.3 01/28/2023   LYMPHSABS 0.4 (L) 01/28/2023   MONOABS 0.4 01/28/2023   EOSABS 0.0 01/28/2023   BASOSABS 0.0 01/28/2023     Last metabolic panel Lab Results  Component Value Date   NA 141 01/28/2023  K 4.2 01/28/2023   CL 99 01/28/2023   CO2 37 (H) 01/28/2023   BUN 7 01/28/2023   CREATININE 0.59 (L) 01/28/2023   GLUCOSE 180 (H) 01/28/2023   GFRNONAA >60 01/28/2023   GFRAA >60 12/15/2019   CALCIUM 9.1 01/28/2023   PHOS 3.6 12/30/2022   PROT 7.1 01/09/2023   ALBUMIN 3.1 (L) 01/09/2023   LABGLOB 3.0 11/18/2022   AGRATIO 1.2 11/18/2022   BILITOT 0.4 01/09/2023   ALKPHOS 62 01/09/2023   AST 14 (L) 01/09/2023   ALT 19 01/09/2023   ANIONGAP 5 01/28/2023    CBG (last 3)  No results for input(s): "GLUCAP" in the last 72 hours.    Coagulation Profile: No results for input(s): "INR", "PROTIME" in the last 168 hours.   Radiology Studies: DG Chest Port 1 View  Result Date: 01/28/2023 CLINICAL DATA:  Pneumothorax. EXAM: PORTABLE CHEST 1 VIEW COMPARISON:  Chest radiographs 01/27/2023 (multiple studies), 01/26/2023; CT chest 12/29/2022 FINDINGS: Left internal jugular port a catheter tip again overlies the superior right atrium, unchanged. Small caliber apparent left-sided chest tube again overlies the left mid to lower lung. Small left apical pneumothorax is unchanged. High-grade right-greater-than-left interstitial thickening and coarse reticular opacities again in keeping with fibrotic interstitial lung disease. Heterogeneous opacification of the right lower lung corresponding to the known right basilar bronchogenic neoplasm. Likely small right pleural effusion, unchanged. Bronchovascular again  overlie the left hilum. Cardiac silhouette and mediastinal contours are unchanged, noting confluent mediastinal lymphadenopathy on prior 12/29/2022 CT. Lower cervical spine disc prosthesis again noted. IMPRESSION: 1. Small caliber left-sided chest tube again overlies the left mid to lower lung. Small left apical pneumothorax is unchanged. 2. High-grade right-greater-than-left interstitial thickening and coarse reticular opacities again in keeping with fibrotic interstitial lung disease. Heterogeneous opacification of the right lower lung corresponding to the known right basilar bronchogenic neoplasm. Electronically Signed   By: Neita Garnet M.D.   On: 01/28/2023 11:46   DG Chest Port 1 View  Result Date: 01/27/2023 CLINICAL DATA:  Pneumothorax. EXAM: PORTABLE CHEST 1 VIEW COMPARISON:  01/27/2023 1:54 p.m. FINDINGS: Left IJ Port-A-Cath unchanged. Small caliber mid to lower left-sided pleural drainage catheter unchanged. Lungs are adequately inflated. There is no change in known small left-sided pneumothorax. Persistent patchy airspace opacification over the right lung with hazy patchy opacification over the left mid to lower lung. Small right-sided effusion unchanged. Stable masslike opacity over the right lower lobe likely representing patient's known lung cancer. Cardiomediastinal silhouette and remainder of the exam is unchanged. IMPRESSION: 1. No change in known small left-sided pneumothorax with small caliber left-sided pleural drainage catheter unchanged. 2. Persistent patchy airspace opacification over the right lung with hazy patchy opacification over the left mid to lower lung. Small right-sided effusion unchanged. Stable masslike opacity over the right lower lobe likely patient's known lung cancer. Electronically Signed   By: Elberta Fortis M.D.   On: 01/27/2023 18:33   DG CHEST PORT 1 VIEW  Result Date: 01/27/2023 CLINICAL DATA:  Pneumothorax EXAM: PORTABLE CHEST 1 VIEW COMPARISON:  01/27/2023  FINDINGS: Decreased size of previously noted left pneumothorax, now small, similar to the 01/26/2023 exam. No significant residual mediastinal shift. Redemonstrated right-sided pleural effusion. Overall stable diffuse pulmonary opacities, right greater than left, with right lower lung mass and right hilar lymphadenopathy. Redemonstrated left pleural catheter. No acute osseous abnormality. IMPRESSION: 1. Decreased size of previously noted left pneumothorax, now small, similar to the 01/26/2023 exam. No significant residual mediastinal shift. 2. Redemonstrated right-sided pleural effusion, diffuse  interstitial lung disease, and right lower lobe mass. Electronically Signed   By: Wiliam Ke M.D.   On: 01/27/2023 14:28   DG CHEST PORT 1 VIEW  Result Date: 01/27/2023 CLINICAL DATA:  Air leak. EXAM: PORTABLE CHEST 1 VIEW COMPARISON:  X-ray 01/26/2023 FINDINGS: Increasing now large left-sided pneumothorax. There is a catheter in place. Increasing opacity of both lungs with some shift of the mediastinum from left-to-right. Right-sided effusion again seen. Normal cardiopericardial silhouette. Bronchial valves identified along the left hilum. Stable left IJ chest port. Overlapping cardiac leads. IMPRESSION: Increasing now large left-sided pneumothorax with slight shift of the mediastinum from left-to-right. Increasing bilateral lung opacities. Critical Value/emergent results were called by telephone at the time of interpretation on 01/27/2023 at 10.21 Am PST to provider Dr. Blake Divine, Who verbally acknowledged these results. Electronically Signed   By: Karen Kays M.D.   On: 01/27/2023 13:21       Kathlen Mody M.D. [facility] 01/28/2023, 2:49 PM  Available via Epic secure chat 7am-7pm After 7 pm, please refer to night coverage provider listed on amion.

## 2023-01-28 NOTE — Consult Note (Signed)
Palliative Care Consult Note                                  Date: 01/28/2023   Patient Name: Bradley Rice  DOB: 1961/04/14  MRN: [id number]  Age / Sex: 62 y.o., male  PCP: Bradley Spencer, FNP Referring Physician: Kathlen Mody, MD  Reason for Consultation: Establishing goals of care  HPI/Patient Profile: 62 y.o. male  with past medical history of small cell lung cancer, recurrent left-sided pneumothorax, idiopathic pulmonary fibrosis, chronic respiratory failure on 6L oxygen at baseline, and chronic pain syndrome.  He was recently hospitalized at [facility] 12/29/2022 through 01/03/2023 for recurrent left-sided pneumothorax.  He underwent bronchoscopy on 01/02/2023 with placement of endobronchial valves.   He presented to the ED on 01/14/2023 with complaint of difficulty breathing, left-sided chest pain, and cough and was re-admitted for recurrent left pneumothorax in the setting of lung cancer.  Palliative Medicine has been consulted for goals of care.  Subjective:   I have reviewed medical records including progress notes, labs and imaging, and received an update from the RN.  Patient underwent bronchoscopy and valve placement earlier today, and initially did well however upon return to the floor repeat x-ray showed large left pneumothorax.  Chest tube was placed on suction and repeat x-ray showed lung reexpansion.    Patient remains lethargic on my initial assessment, so I met with his wife to discuss diagnosis, prognosis, and goals of care.  I introduced Palliative Medicine as specialized medical care for people living with serious illness. It focuses on providing relief from the symptoms and stress of a serious illness.   Created space and opportunity for patient and family to express thoughts and feelings regarding current medical situation.Values and goals of care were attempted to be elicited.  Social History: Patient lives at home with his  wife. They have been married for 41 years.  They have 2 sons, Bradley Rice and Rice.  They also have 4 grandchildren.  Prior to his cancer diagnosis, patient worked in Production designer, theatre/television/film and as a Curator.  GOC Discussion: We discussed patient's current illness and what it means in the larger context of his/her ongoing co-morbidities. Current clinical status was reviewed.  We discussed the natural disease trajectory of metastatic cancer emphasizing that it is noncurable.  We reviewed that the intent of treatment is palliative in nature.  When patient was initially diagnosed with lung cancer in May, wife reports being told that he had a prognosis of 4 months with treatment versus 2 months without treatment.  She reports that he was doing well with the chemotherapy and that it had "helped his pain".   We reviewed that this is patient's third pneumothorax and that treatment options are limited.  Reviewed that he has been evaluated by CVTS, and and fortunately is not a candidate for any surgical intervention due to his significant comorbidities including interstitial lung disease.  We discussed different paths for scope of treatment - full scope versus limited interventions (treat the treatable) versus comfort care. We also discussed code status. Encouraged consideration of DNR status understanding evidenced based poor outcomes in similar hospitalized patients, as the cause of the arrest is likely associated with chronic/terminal disease rather than a reversible acute cardio-pulmonary event.   I explained that DNR does not change the medical plan and only comes into effect after a person has arrested (died).     Wife  expresses not wanting her husband to suffer, but also is not ready to "let him go".  She is emotional and tearful throughout our discussion.  She ultimately agrees to no CPR in the event of cardiac arrest, but otherwise wishes him to remain full scope.  She would be agreeable to intubation in the  event of respiratory failure. ___________________________________________________________________  When we returned to bedside, patient is awake and oriented.  I summarized to him the discussion I had just had with his wife.  Patient verbalizes understanding that he has terminal cancer, but wants to "keep living" and states his goal is to "get better".  He verbalizes understanding that his prognosis would be poor in the event of cardiac arrest, but is adamant in his desire to continue full code status.   Discussed the importance of continued conversation with patient, family, and the medical team regarding overall plan of care and treatment options.   Review of Systems  Constitutional:  Positive for fatigue.    Objective:   Primary Diagnoses: Present on Admission:  Recurrent left pneumothorax  Acute on chronic respiratory failure with hypoxia (HCC)  Endobronchial cancer, right (HCC)  IPF (idiopathic pulmonary fibrosis) (HCC)  Depression  Chronic pain   Physical Exam Vitals reviewed.  Constitutional:      Appearance: He is cachectic. He is ill-appearing.  Cardiovascular:     Rate and Rhythm: Tachycardia present.  Pulmonary:     Effort: No respiratory distress.  Neurological:     Mental Status: He is alert and oriented to person, place, and time.     Vital Signs:  BP 118/74 (BP Location: Left Arm)   Pulse 95   Temp 97.7 F (36.5 C) (Oral)   Resp 15   Ht 6' (1.829 m)   Wt 60.5 kg   SpO2 100%   BMI 18.09 kg/m   Palliative Assessment/Data: PPS 40-50%     Assessment & Plan:   SUMMARY OF RECOMMENDATIONS   Full code  Continue full scope care Patient's stated goal is to "get better" Ongoing palliative support  Patient and wife both verbalized understanding that he has noncurable cancer.  However, patient appears to have poor insight into his condition and overall prognosis.   Primary Decision Maker: PATIENT  Symptom Management:  Per primary  team  Prognosis:  Poor overall in the setting of advanced lung cancer and recurrent pneumothorax  Discharge Planning:  To Be Determined   Discussed with: Dr. Blake Divine, PCCM, and RN   Thank you for allowing [place] to participate in the care of Bradley QUINTIN   Time Total: 95 minutes  Greater than 50%  of this time was spent counseling and coordinating care related to the above assessment and plan.  Signed by: Sherlean Foot, NP Palliative Medicine Team  Team Phone # [phone]  For individual providers, please see AMION

## 2023-01-28 NOTE — Progress Notes (Signed)
Patient reporting that had to have a bowel movement. Suggested BSC. Patient reported he did not want to use BSC and that he wanted to walk to bathroom. DOE.

## 2023-01-28 NOTE — Progress Notes (Signed)
PCCM:  Chest tube placed on waterseal  If patient develops respiratory distress please place back on suction -20cmH20 We will recheck CXR in 3 hours  Bradley Igo, DO [facility] 01/28/2023 10:42 AM

## 2023-01-28 NOTE — Progress Notes (Signed)
Palliative Medicine Progress Note   Patient Name: Bradley Rice       Date: 01/28/2023 DOB: January 09, 1961  Age: 62 y.o. MRN#: [id number] Attending Physician: Kathlen Mody, MD Primary Care Physician: [name], FNP Admit Date: 01/23/2023  Reason for Consultation/Follow-up: {Reason for Consult:23484}  HPI/Patient Profile: 62 y.o. male  with past medical history of small cell lung cancer, recurrent left-sided pneumothorax, idiopathic pulmonary fibrosis, chronic respiratory failure on 6L oxygen at baseline, and chronic pain syndrome.  He was recently hospitalized at [facility] 12/29/2022 through 01/03/2023 for recurrent left-sided pneumothorax.  He underwent bronchoscopy on 01/02/2023 with placement of endobronchial valves.   He presented to the ED on 01/17/2023 with complaint of difficulty breathing, left-sided chest pain, and cough and was re-admitted for recurrent left pneumothorax in the setting of lung cancer.   Palliative Medicine has been consulted for goals of care.  Subjective: Chart reviewed.  Note that chest tube was placed to waterseal earlier today but then was changed back to suction.  Bedside visit.  Patient states he fees "not good", but does report feeling better than yesterday.  Objective:  Physical Exam          Vital Signs: BP 101/69 (BP Location: Left Arm)   Pulse (!) 105   Temp 98.1 F (36.7 C) (Oral)   Resp 19   Ht 6' (1.829 m)   Wt 60.5 kg   SpO2 100%   BMI 18.09 kg/m  SpO2: SpO2: 100 % O2 Device: O2 Device: Nasal Cannula O2 Flow Rate: O2 Flow Rate (L/min): 6 L/min   LBM: Last BM Date : 01/27/23     Palliative Assessment/Data: ***     Palliative Medicine Assessment & Plan   Assessment: Principal Problem:   Recurrent left pneumothorax Active Problems:    Depression   Chronic pain   IPF (idiopathic pulmonary fibrosis) (HCC)   Acute on chronic respiratory failure with hypoxia (HCC)   Endobronchial cancer, right (HCC)   Protein-calorie malnutrition, severe    Recommendations/Plan: ***  Goals of Care and Additional Recommendations: Limitations on Scope of Treatment: {Recommended Scope and Preferences:21019}  Code Status:   Prognosis:  {Palliative Care Prognosis:23504}  Discharge Planning: {Palliative dispostion:23505}  Care plan was discussed with ***  Thank you for allowing the Palliative Medicine Team to assist in the care of this  patient.   ***   Lavena Bullion, NP   Please contact Palliative Medicine Team phone at [phone] for questions and concerns.  For individual providers, please see AMION.

## 2023-01-28 NOTE — Plan of Care (Signed)

## 2023-01-29 DIAGNOSIS — J9621 Acute and chronic respiratory failure with hypoxia: Secondary | ICD-10-CM | POA: Diagnosis not present

## 2023-01-29 DIAGNOSIS — J939 Pneumothorax, unspecified: Secondary | ICD-10-CM | POA: Diagnosis not present

## 2023-01-29 DIAGNOSIS — J84112 Idiopathic pulmonary fibrosis: Secondary | ICD-10-CM | POA: Diagnosis not present

## 2023-01-29 LAB — CULTURE, BLOOD (ROUTINE X 2): Special Requests: ADEQUATE

## 2023-01-29 NOTE — Progress Notes (Signed)
[facility]                                                                               [name], is a 62 y.o. male, DOB - 1961-07-15, EAV:[id number] Admit date - 01/06/2023    Outpatient Primary MD for the patient is [name], FNP  LOS - 5  days    Brief summary   Bradley Rice is a 62 y.o. male with medical history significant for ALS, lung cancer, hypertension, idiopathic pulmonary fibrosis, chronic respiratory failure on 6 L at baseline. Patient presented to the ED with complaints of difficulty breathing, left-sided chest pain and cough that started yesterday.    Recent hospitalization 5/20 to 6/4 for recurrent left-sided pneumothorax, who was admitted at [facility] for pneumothorax and had chest tube 5/24 through 5/28.  He underwent bronchoscopy 01/02/2023 with placement of endobronchial valves.  Chest x-ray showed large left-sided pneumothorax with slight rightward shift of the mediastinum. General surgery was consulted, patient ultimately underwent chest tube placement,. EDP talked to intensivist Dr. Katrinka Blazing, recommend admission to [facility] under hospitalist service, will see in consult.   Assessment & Plan    Assessment and Plan:   * Recurrent left pneumothorax Presenting with chest pain, difficulty breathing and acute chronic hypoxic respiratory failure.  Chest x-ray shows recurrent large left-sided pneumo thorax with slight rightward shift of the mediastinum.    Pneumothorax in the setting of extensive stage small cell lung cancer, COPD, and IPF. He underwent endobronchial valve placement by Dr Tonia Brooms earlier this month.  - Cardiothoracic Surgery consulted, unfortunately not a surgical candidate.   - underwent chest tube placement  on admission.  PCCM consulted, underwent bronchoscopy , showing stable valves, another 3 valves were placed.  He continues to have air leak, he continues to fail water seal trial. He was placed back on suction.  Repeat  CXR ordered for tomorrow.    Endobronchial cancer, right (HCC) History of extensive stage small cell lung cancer, currently on chemotherapy.  Follows with Dr. Ellin Saba.  Chest x-ray today shows persistent masslike consolidation in right lung base.  Acute on chronic respiratory failure with hypoxia (HCC) due to Idiopathic pulmonary fibrosis.  On 6 lit of [place] oxygen at home.  Initially required 100% oxygen, currently back on 6 lit of [place] oxygen.    Chronic pain syndrome Patient is on OxyContin 20 mg BID and oxycodone 15 mg every 6 hours prn along with fentanyl patch  at home, which were restarted, but with drop in BP, stopped the fentanyl patch.  Restarted home meds along with IV dilaudid and IV toradol.   H/o of ALS Therapy evaluations will be ordered.   Leukopenia and anemia of chronic disease  From chemotherapy.  Monitor.   Hypotension of unclear etiology BP parameters improved with midodrine and two doses of hydrocortisone. Will d/c stress dose steroids.  Continue with midodrine.    RN Pressure Injury Documentation: Pressure Injury 12/29/22 Coccyx Upper Stage 2 -  Partial thickness loss of dermis presenting as a shallow open injury with a red, pink wound bed without slough. pin point size (Active)  12/29/22 1609  Location: Coccyx  Location Orientation: Upper  Staging: Stage 2 -  Partial thickness loss of dermis presenting as a shallow open injury with a red, pink wound bed without slough.  Wound Description (Comments): pin point size  Present on Admission: Yes  Dressing Type Foam - Lift dressing to assess site every shift 01/02/23 2000   Thrombocytosis:  Reactive.     Lactic acidosis:  Resolved.    Estimated body mass index is 19.08 kg/m as calculated from the following:   Height as of this encounter: 6' (1.829 m).   Weight as of this encounter: 63.8 kg.  Code Status: full code DVT Prophylaxis:  SCDs Start: 01/08/2023 2345   Level of Care: Level of care:  Progressive Family Communication: none at bedside  Disposition Plan:     Remains inpatient appropriate:  further management of recurrent pneumothorax.   Procedures:  Bronchoscopy.   Consultants:   PCCM.  Cardiothoracic surgery Palliative care.    Antimicrobials:   Anti-infectives (From admission, onward)    None        Medications  Scheduled Meds:  busPIRone  5 mg Oral TID   DULoxetine  60 mg Oral Daily   famotidine  20 mg Oral BID   feeding supplement  237 mL Oral QID   ketorolac  15 mg Intravenous Q6H   midodrine  5 mg Oral TID WC   multivitamin with minerals  1 tablet Oral Daily   nortriptyline  50 mg Oral BID   sodium chloride flush  10 mL Intrapleural Q8H   Continuous Infusions: PRN Meds:.acetaminophen **OR** acetaminophen, albuterol, ALPRAZolam, alum & mag hydroxide-simeth, benzonatate, HYDROmorphone (DILAUDID) injection, mouth rinse, oxyCODONE-acetaminophen, polyethylene glycol, traZODone    Subjective:   Bradley Rice was seen and examined today.   Alert , no new complaints.   Objective:   Vitals:   01/29/23 1119 01/29/23 1201 01/29/23 1400 01/29/23 1527  BP: 116/82 116/70 103/75 121/86  Pulse: (!) 102   (!) 101  Resp: 19   19  Temp: 97.9 F (36.6 C)   98.2 F (36.8 C)  TempSrc: Oral   Oral  SpO2: 100%   100%  Weight:      Height:        Intake/Output Summary (Last 24 hours) at 01/29/2023 1708 Last data filed at 01/29/2023 1400 Gross per 24 hour  Intake 477 ml  Output 360 ml  Net 117 ml    Filed Weights   01/26/23 0538 01/28/23 0500 01/29/23 0409  Weight: 56.7 kg 60.5 kg 63.8 kg     Exam General exam: Appears calm and comfortable  Respiratory system: air entry diminished on the left, s/p chest tube.  Cardiovascular system: S1 & S2 heard, RRR.  Gastrointestinal system: Abdomen is nondistended, soft and nontender.  Central nervous system: Alert and oriented. No focal neurological deficits. Extremities: Symmetric 5 x 5 power. Skin: No  rashes,  Psychiatry:Mood & affect appropriate.       Data Reviewed:  I have personally reviewed following labs and imaging studies   CBC Lab Results  Component Value Date   WBC 2.7 (L) 01/28/2023   RBC 2.89 (L) 01/28/2023   HGB 8.1 (L) 01/28/2023   HCT 26.2 (L) 01/28/2023   MCV 90.7 01/28/2023   MCH 28.0 01/28/2023   PLT 411 (H) 01/28/2023   MCHC 30.9 01/28/2023   RDW 15.3 01/28/2023   LYMPHSABS 0.4 (L) 01/28/2023   MONOABS 0.4 01/28/2023   EOSABS 0.0 01/28/2023   BASOSABS 0.0 01/28/2023  Last metabolic panel Lab Results  Component Value Date   NA 141 01/28/2023   K 4.2 01/28/2023   CL 99 01/28/2023   CO2 37 (H) 01/28/2023   BUN 7 01/28/2023   CREATININE 0.59 (L) 01/28/2023   GLUCOSE 180 (H) 01/28/2023   GFRNONAA >60 01/28/2023   GFRAA >60 12/15/2019   CALCIUM 9.1 01/28/2023   PHOS 3.6 12/30/2022   PROT 7.1 01/09/2023   ALBUMIN 3.1 (L) 01/09/2023   LABGLOB 3.0 11/18/2022   AGRATIO 1.2 11/18/2022   BILITOT 0.4 01/09/2023   ALKPHOS 62 01/09/2023   AST 14 (L) 01/09/2023   ALT 19 01/09/2023   ANIONGAP 5 01/28/2023    CBG (last 3)  No results for input(s): "GLUCAP" in the last 72 hours.    Coagulation Profile: No results for input(s): "INR", "PROTIME" in the last 168 hours.   Radiology Studies: DG CHEST PORT 1 VIEW  Result Date: 01/28/2023 CLINICAL DATA:  Follow-up pneumothorax EXAM: PORTABLE CHEST 1 VIEW COMPARISON:  01/28/2023, 01/27/2023, chest CT 12/29/2022 FINDINGS: Left-sided central venous port tip at the cavoatrial region. Left-sided pleural drainage catheter similar in position. Interval increase in size left pneumothorax, now moderate to large in size with mild shift of mediastinal contents to the right. Small loculated right pleural effusion. Underlying interstitial lung disease and right lower lobe lung mass as seen on previous imaging. Left-sided bronchial valves as before. IMPRESSION: 1. Interval increase in size of left pneumothorax, now  moderate to large in size with mild shift of mediastinal contents to the right. Left pleural drainage catheter similar in position. 2. Small loculated right pleural effusion. Underlying interstitial lung disease and right lower lobe lung mass. Critical Value/emergent results were called by telephone at the time of interpretation on 01/28/2023 at 5:02 pm to provider Dr. Blake Divine, Who verbally acknowledged these results. Electronically Signed   By: Jasmine Pang M.D.   On: 01/28/2023 17:02   DG Chest Port 1 View  Result Date: 01/28/2023 CLINICAL DATA:  Pneumothorax. EXAM: PORTABLE CHEST 1 VIEW COMPARISON:  Chest radiographs 01/27/2023 (multiple studies), 01/26/2023; CT chest 12/29/2022 FINDINGS: Left internal jugular port a catheter tip again overlies the superior right atrium, unchanged. Small caliber apparent left-sided chest tube again overlies the left mid to lower lung. Small left apical pneumothorax is unchanged. High-grade right-greater-than-left interstitial thickening and coarse reticular opacities again in keeping with fibrotic interstitial lung disease. Heterogeneous opacification of the right lower lung corresponding to the known right basilar bronchogenic neoplasm. Likely small right pleural effusion, unchanged. Bronchovascular again overlie the left hilum. Cardiac silhouette and mediastinal contours are unchanged, noting confluent mediastinal lymphadenopathy on prior 12/29/2022 CT. Lower cervical spine disc prosthesis again noted. IMPRESSION: 1. Small caliber left-sided chest tube again overlies the left mid to lower lung. Small left apical pneumothorax is unchanged. 2. High-grade right-greater-than-left interstitial thickening and coarse reticular opacities again in keeping with fibrotic interstitial lung disease. Heterogeneous opacification of the right lower lung corresponding to the known right basilar bronchogenic neoplasm. Electronically Signed   By: Neita Garnet M.D.   On: 01/28/2023 11:46   DG  Chest Port 1 View  Result Date: 01/27/2023 CLINICAL DATA:  Pneumothorax. EXAM: PORTABLE CHEST 1 VIEW COMPARISON:  01/27/2023 1:54 p.m. FINDINGS: Left IJ Port-A-Cath unchanged. Small caliber mid to lower left-sided pleural drainage catheter unchanged. Lungs are adequately inflated. There is no change in known small left-sided pneumothorax. Persistent patchy airspace opacification over the right lung with hazy patchy opacification over the left mid to lower lung. Small  right-sided effusion unchanged. Stable masslike opacity over the right lower lobe likely representing patient's known lung cancer. Cardiomediastinal silhouette and remainder of the exam is unchanged. IMPRESSION: 1. No change in known small left-sided pneumothorax with small caliber left-sided pleural drainage catheter unchanged. 2. Persistent patchy airspace opacification over the right lung with hazy patchy opacification over the left mid to lower lung. Small right-sided effusion unchanged. Stable masslike opacity over the right lower lobe likely patient's known lung cancer. Electronically Signed   By: Elberta Fortis M.D.   On: 01/27/2023 18:33       Kathlen Mody M.D. [facility] 01/29/2023, 5:08 PM  Available via Epic secure chat 7am-7pm After 7 pm, please refer to night coverage provider listed on amion.

## 2023-01-29 NOTE — Plan of Care (Signed)
  Problem: Activity: Goal: Risk for activity intolerance will decrease Outcome: Progressing   Problem: Coping: Goal: Level of anxiety will decrease Outcome: Progressing   

## 2023-01-29 NOTE — Progress Notes (Signed)
NAME:  ELLWYN HASBUN, MRN:  [id number], DOB:  1960/08/26, LOS: 5 ADMISSION DATE:  01/18/2023, CONSULTATION DATE: 01/25/2023 REFERRING MD: [facility], CHIEF COMPLAINT: Recurrent left pneumothorax in the setting of endobronchial cancer  History of Present Illness:  Mr. 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  He has had 3 left pneumothoraces with chest tubes times 3+ endobronchial valves placed by Dr. Tonia Brooms on the left early June or 2024.  He again presented to [facility] with chest pain and shortness of breath and was found to have a large left pneumothoraces.  He had a chest tube placed by surgical intervention at [facility].  Transferred to [facility] for further evaluation and treatment.  He is well-known to our practice.  He has never had a formal thoracic consult.  The last thoracic surgery to follow and confer with Dr. Tonia Brooms concerning further interventions.  Pertinent  Medical History   Past Medical History:  Diagnosis Date   ALS (amyotrophic lateral sclerosis) (HCC)    Asthma    Back pain    COPD (chronic obstructive pulmonary disease) (HCC)    Coronary artery calcification seen on CT scan    DDD (degenerative disc disease), lumbar    Depression    Dysphagia    Following previous surgery   History of blood transfusion 2014   Post-op bleed   History of lung cancer    History of stroke 2015   Idiopathic pulmonary fibrosis (HCC)    Migraine    Port-A-Cath in place 12/15/2022   Spider bite 2012     Significant Hospital Events: Including procedures, antibiotic start and stop dates in addition to other pertinent events   Chest tube inserted at [facility] 6/28 to endo, 3 valves placed in LLL 6/29 failed waterseal trial 6/30 moved to -10 cm of water.   Interim History / Subjective:   Chest tube in place  airleak present still on suction Objective   Blood pressure 116/70, pulse (!) 102, temperature 97.9 F (36.6 C), temperature source Oral, resp. rate 19, height 6' (1.829 m), weight 63.8 kg, SpO2 100 %.        Intake/Output Summary (Last 24 hours) at 01/29/2023 1324 Last data filed at 01/29/2023 0805 Gross per 24 hour  Intake 240 ml  Output 340 ml  Net -100 ml   Filed Weights   01/26/23 0538 01/28/23 0500 01/29/23 0409  Weight: 56.7 kg 60.5 kg 63.8 kg    Examination: Frail cachectic gentleman Low muscle mass, visible muscle wasting Diminished breath sounds bilaterally Rate rhythm S1-S2 2 Soft nontender nondistended   Assessment & Plan:   Recurrent left pneumothorax in the setting of lung cancer status post endobronchial valves this will be his third pneumothorax with a chest tube placement at [facility].  He had endobronchial valves placed by Dr. Tonia Brooms early June 2024.  He has not had a formal consult by cardiovascular thoracic surgery at this time. He is now s/p 3 additional valves in LLL on 01/26/23 (Dr. Tonia Brooms) P: Unfortunately the patient continues to fail waterseal trial. We was placed back on suction with improvement of his pneumothorax again yesterday. I have turned his suction down to -10 cm of water. Organ to see if we can do our best to trying to wean him off of suction. Additional options would  be consideration for a autologous blood patch. I am not sure that talc pleurodesis will work without good clear opposition of the lung to the chest wall.  Idiopathic pulmonary fibrosis O2 dependent at 6 L nasal cannula Continue oxygen as needed  Multiple cervical and lumbar  Small cell carcinoma  -op follow up  GOC: He needs ongoing goals of care discussions   Rest per primary team.  Best Practice (right click and "Reselect all SmartList Selections" daily)   Josephine Igo, DO [facility] 01/29/2023 1:24 PM

## 2023-01-30 ENCOUNTER — Inpatient Hospital Stay: Payer: Medicaid Other | Admitting: Dietician

## 2023-01-30 ENCOUNTER — Encounter (HOSPITAL_COMMUNITY): Payer: Self-pay | Admitting: Pulmonary Disease

## 2023-01-30 ENCOUNTER — Inpatient Hospital Stay: Payer: Medicaid Other

## 2023-01-30 ENCOUNTER — Inpatient Hospital Stay: Payer: Medicaid Other | Admitting: Hematology

## 2023-01-30 ENCOUNTER — Inpatient Hospital Stay (HOSPITAL_COMMUNITY): Payer: 59

## 2023-01-30 DIAGNOSIS — J939 Pneumothorax, unspecified: Secondary | ICD-10-CM | POA: Diagnosis not present

## 2023-01-30 DIAGNOSIS — J9621 Acute and chronic respiratory failure with hypoxia: Secondary | ICD-10-CM | POA: Diagnosis not present

## 2023-01-30 DIAGNOSIS — J84112 Idiopathic pulmonary fibrosis: Secondary | ICD-10-CM | POA: Diagnosis not present

## 2023-01-30 LAB — CBC WITH DIFFERENTIAL/PLATELET
Abs Immature Granulocytes: 0.8 10*3/uL — ABNORMAL HIGH (ref 0.00–0.07)
Basophils Absolute: 0 10*3/uL (ref 0.0–0.1)
Basophils Relative: 0 %
Eosinophils Absolute: 0.1 10*3/uL (ref 0.0–0.5)
Eosinophils Relative: 1 %
HCT: 29.3 % — ABNORMAL LOW (ref 39.0–52.0)
Hemoglobin: 9.2 g/dL — ABNORMAL LOW (ref 13.0–17.0)
Lymphocytes Relative: 17 %
Lymphs Abs: 1.5 10*3/uL (ref 0.7–4.0)
MCH: 29.3 pg (ref 26.0–34.0)
MCHC: 31.4 g/dL (ref 30.0–36.0)
MCV: 93.3 fL (ref 80.0–100.0)
Monocytes Absolute: 1.5 10*3/uL — ABNORMAL HIGH (ref 0.1–1.0)
Monocytes Relative: 17 %
Myelocytes: 9 %
Neutro Abs: 4.9 10*3/uL (ref 1.7–7.7)
Neutrophils Relative %: 56 %
Platelets: 488 10*3/uL — ABNORMAL HIGH (ref 150–400)
RBC: 3.14 MIL/uL — ABNORMAL LOW (ref 4.22–5.81)
RDW: 15.9 % — ABNORMAL HIGH (ref 11.5–15.5)
WBC: 8.7 10*3/uL (ref 4.0–10.5)
nRBC: 0 % (ref 0.0–0.2)
nRBC: 0 /100 WBC

## 2023-01-30 LAB — BASIC METABOLIC PANEL
Anion gap: 8 (ref 5–15)
BUN: 13 mg/dL (ref 6–20)
CO2: 35 mmol/L — ABNORMAL HIGH (ref 22–32)
Calcium: 8.9 mg/dL (ref 8.9–10.3)
Chloride: 96 mmol/L — ABNORMAL LOW (ref 98–111)
Creatinine, Ser: 0.73 mg/dL (ref 0.61–1.24)
GFR, Estimated: >60 mL/min (ref >60)
Glucose, Bld: 89 mg/dL (ref 70–99)
Potassium: 4.2 mmol/L (ref 3.5–5.1)
Sodium: 139 mmol/L (ref 135–145)

## 2023-01-30 LAB — CULTURE, BLOOD (ROUTINE X 2): Special Requests: ADEQUATE

## 2023-01-30 NOTE — Significant Event (Signed)
Mr. Bradley Rice is yellow due to HR 111. He c/o mild SOB, feeling hot. Lungsounds were dim/exp. wheezing.Neb tx  administered with relief, lungsound much improved post tx. His HR increased to117, s/p Albuterol. ST. BP 117/78. No change with chest tube, still mild intermittent airleak and O2 Sat remained 100% throughout. RT notified and arrived to bedside. Dr Blake Divine notified.

## 2023-01-30 NOTE — Plan of Care (Signed)
°  Problem: Coping: °Goal: Level of anxiety will decrease °Outcome: Progressing °  °

## 2023-01-30 NOTE — Progress Notes (Signed)
[facility]                                                                               Bradley Rice, is a 62 y.o. male, DOB - 07/05/1961, ZOX:[id number] Admit date - 01/01/2023    Outpatient Primary MD for the patient is Bradley Spencer, FNP  LOS - 6  days    Brief summary   Bradley Rice is a 62 y.o. male with medical history significant for ALS, lung cancer, hypertension, idiopathic pulmonary fibrosis, chronic respiratory failure on 6 L at baseline. Patient presented to the ED with complaints of difficulty breathing, left-sided chest pain and cough that started yesterday.    Recent hospitalization 5/20 to 6/4 for recurrent left-sided pneumothorax, who was admitted at [facility] for pneumothorax and had chest tube 5/24 through 5/28.  He underwent bronchoscopy 01/02/2023 with placement of endobronchial valves.  Chest x-ray showed large left-sided pneumothorax with slight rightward shift of the mediastinum. General surgery was consulted, patient ultimately underwent chest tube placement,. EDP talked to intensivist Dr. Katrinka Blazing, recommend admission to [facility] under hospitalist service, will see in consult.   Assessment & Plan    Assessment and Plan:   * Recurrent left pneumothorax Presenting with chest pain, difficulty breathing and acute chronic hypoxic respiratory failure.  Chest x-ray shows recurrent large left-sided pneumo thorax with slight rightward shift of the mediastinum.    Pneumothorax in the setting of extensive stage small cell lung cancer, COPD, and IPF. He underwent endobronchial valve placement by Dr Tonia Brooms earlier this month.  - Cardiothoracic Surgery consulted, unfortunately not a surgical candidate.   - underwent chest tube placement  on admission.  PCCM consulted, underwent bronchoscopy , showing stable valves, another 3 valves were placed.  Unfortunately patient continues to fail waterseal trial. PCCM recommending home hospice at this time.     Endobronchial cancer, right (HCC) History of extensive stage small cell lung cancer, currently on chemotherapy.  Follows with Dr. Ellin Saba.  Chest x-ray today shows persistent masslike consolidation in right lung base.  Acute on chronic respiratory failure with hypoxia (HCC) due to Idiopathic pulmonary fibrosis.  On 6 lit of [place] oxygen at home.  Initially required 100% oxygen, currently back on 6 lit of [place] oxygen.    Chronic pain syndrome Patient is on OxyContin 20 mg BID and oxycodone 15 mg every 6 hours prn along with fentanyl patch  at home, which were restarted, but with drop in BP, stopped the fentanyl patch.  Restarted home meds along with IV dilaudid and IV toradol.   H/o of ALS Therapy evaluations will be ordered.   Leukopenia and anemia of chronic disease Improving.   Hypotension of unclear etiology BP parameters improved with midodrine and two doses of hydrocortisone. Will d/c stress dose steroids.  Continue with midodrine.    RN Pressure Injury Documentation: Pressure Injury 12/29/22 Coccyx Upper Stage 2 -  Partial thickness loss of dermis presenting as a shallow open injury with a red, pink wound bed without slough. pin point size (Active)  12/29/22 1609  Location: Coccyx  Location Orientation: Upper  Staging: Stage 2 -  Partial thickness loss of dermis presenting  as a shallow open injury with a red, pink wound bed without slough.  Wound Description (Comments): pin point size  Present on Admission: Yes  Dressing Type Foam - Lift dressing to assess site every shift 01/30/23 0800   Thrombocytosis:  Reactive.     Lactic acidosis:  Resolved.    Estimated body mass index is 18.54 kg/m as calculated from the following:   Height as of this encounter: 6' (1.829 m).   Weight as of this encounter: 62 kg.  Code Status: full code DVT Prophylaxis:  SCDs Start: 01/27/2023 2345   Level of Care: Level of care: Progressive Family Communication: none at  bedside  Disposition Plan:     Remains inpatient appropriate:  further management of recurrent pneumothorax.   Procedures:  Bronchoscopy.   Consultants:   PCCM.  Cardiothoracic surgery Palliative care.    Antimicrobials:   Anti-infectives (From admission, onward)    None        Medications  Scheduled Meds:  busPIRone  5 mg Oral TID   DULoxetine  60 mg Oral Daily   famotidine  20 mg Oral BID   feeding supplement  237 mL Oral QID   midodrine  5 mg Oral TID WC   multivitamin with minerals  1 tablet Oral Daily   nortriptyline  50 mg Oral BID   sodium chloride flush  10 mL Intrapleural Q8H   Continuous Infusions: PRN Meds:.acetaminophen **OR** acetaminophen, albuterol, ALPRAZolam, alum & mag hydroxide-simeth, benzonatate, HYDROmorphone (DILAUDID) injection, mouth rinse, oxyCODONE-acetaminophen, polyethylene glycol, traZODone    Subjective:   Bradley Rice was seen and examined today.   Reports feeling tired. No chest pain. Remains on 6 lit of [place] oxygen.   Objective:   Vitals:   01/30/23 1000 01/30/23 1150 01/30/23 1200 01/30/23 1222  BP: 92/75 105/72 106/73 117/78  Pulse: (!) 102 (!) 111 (!) 112 (!) 116  Resp:  18    Temp:      TempSrc:      SpO2: 100% 100% 100% 100%  Weight:      Height:        Intake/Output Summary (Last 24 hours) at 01/30/2023 1621 Last data filed at 01/30/2023 1200 Gross per 24 hour  Intake 240 ml  Output 1425 ml  Net -1185 ml    Filed Weights   01/28/23 0500 01/29/23 0409 01/30/23 0412  Weight: 60.5 kg 63.8 kg 62 kg     Exam General exam: ill appearing gentleman, not in distress.  Respiratory system: air entry diminished on the left, s/p chest tube undersuction.  Cardiovascular system: RRR, S1S2 heard. Gastrointestinal system: Abdomen is nondistended, soft and nontender.  Central nervous system: Alert and oriented. No focal neurological deficits. Extremities: no pedal edema.  Skin: No rashes,  Psychiatry: flat affect.        Data Reviewed:  I have personally reviewed following labs and imaging studies   CBC Lab Results  Component Value Date   WBC 8.7 01/30/2023   RBC 3.14 (L) 01/30/2023   HGB 9.2 (L) 01/30/2023   HCT 29.3 (L) 01/30/2023   MCV 93.3 01/30/2023   MCH 29.3 01/30/2023   PLT 488 (H) 01/30/2023   MCHC 31.4 01/30/2023   RDW 15.9 (H) 01/30/2023   LYMPHSABS 1.5 01/30/2023   MONOABS 1.5 (H) 01/30/2023   EOSABS 0.1 01/30/2023   BASOSABS 0.0 01/30/2023     Last metabolic panel Lab Results  Component Value Date   NA 139 01/30/2023   K 4.2 01/30/2023  CL 96 (L) 01/30/2023   CO2 35 (H) 01/30/2023   BUN 13 01/30/2023   CREATININE 0.73 01/30/2023   GLUCOSE 89 01/30/2023   GFRNONAA >60 01/30/2023   GFRAA >60 12/15/2019   CALCIUM 8.9 01/30/2023   PHOS 3.6 12/30/2022   PROT 7.1 01/09/2023   ALBUMIN 3.1 (L) 01/09/2023   LABGLOB 3.0 11/18/2022   AGRATIO 1.2 11/18/2022   BILITOT 0.4 01/09/2023   ALKPHOS 62 01/09/2023   AST 14 (L) 01/09/2023   ALT 19 01/09/2023   ANIONGAP 8 01/30/2023    CBG (last 3)  No results for input(s): "GLUCAP" in the last 72 hours.    Coagulation Profile: No results for input(s): "INR", "PROTIME" in the last 168 hours.   Radiology Studies: DG CHEST PORT 1 VIEW  Result Date: 01/30/2023 CLINICAL DATA:  Follow-up exam. Recurrent pneumothorax. Shortness of breath and cough. EXAM: PORTABLE CHEST 1 VIEW COMPARISON:  Chest radiograph 01/28/2023 FINDINGS: A left jugular Port-A-Cath, left-sided bronchial valves, and a left pleural drainage catheter remain in place. The cardiomediastinal silhouette is unchanged. The left-sided pneumothorax has decreased substantially in size, now small to moderate. Underlying chronic interstitial lung disease, a right lower lobe mass, and small loculated right pleural effusion are again noted. There is increased airspace opacity in the right upper lobe. IMPRESSION: 1. Decreased size of a now small to moderate left pneumothorax.  2. Increased right upper lobe airspace disease. 3. Unchanged small loculated right pleural effusion with underlying interstitial lung disease and right lower lobe mass. Electronically Signed   By: Sebastian Ache M.D.   On: 01/30/2023 09:01       Kathlen Mody M.D. [facility] 01/30/2023, 4:21 PM  Available via Epic secure chat 7am-7pm After 7 pm, please refer to night coverage provider listed on amion.

## 2023-01-30 NOTE — Progress Notes (Addendum)
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  He has had 3 left pneumothoraces with chest tubes times 3+ endobronchial valves placed by Dr. Tonia Brooms on the left early June or 2024.  He again presented to [facility] with chest pain and shortness of breath and was found to have a large left pneumothoraces.  He had a chest tube placed by surgical intervention at [facility].  Transferred to [facility] for further evaluation and treatment.  He is well-known to our practice.  He has never had a formal thoracic consult.  The last thoracic surgery to follow and confer with Dr. Tonia Brooms concerning further interventions.  Pertinent  Medical History   Past Medical History:  Diagnosis Date   ALS (amyotrophic lateral sclerosis) (HCC)    Asthma    Back pain    COPD (chronic obstructive pulmonary disease) (HCC)    Coronary artery calcification seen on CT scan    DDD (degenerative disc disease), lumbar    Depression    Dysphagia    Following previous surgery   History of blood transfusion 2014   Post-op bleed   History of lung cancer    History of stroke 2015   Idiopathic pulmonary fibrosis (HCC)    Migraine    Port-A-Cath in place 12/15/2022   Spider bite 2012     Significant Hospital Events: Including procedures, antibiotic start and stop dates in addition to other pertinent events   Chest tube inserted at [facility] 6/28 to endo, 3 valves placed in LLL 6/29 failed waterseal trial 6/30 moved to -10 cm of water.   Interim History / Subjective:   Still on -10 cm  H20.  Objective   Blood pressure 117/78, pulse (!) 116, temperature 98.2 F (36.8 C), temperature source Oral, resp. rate 18, height 6' (1.829 m), weight 62 kg, SpO2 100 %.        Intake/Output Summary (Last 24 hours) at 01/30/2023 1412 Last data filed at 01/30/2023 1200 Gross per 24 hour  Intake 240 ml  Output 1425 ml  Net -1185 ml   Filed Weights   01/28/23 0500 01/29/23 0409 01/30/23 0412  Weight: 60.5 kg 63.8 kg 62 kg    Examination: Frail, cachectic, chronically ill appearing Tachycardic, regular  Diminished breath sounds with wheezing Left sided chest tube in place, Persistent air leak 3+ despite being on suction Extremities thin, no edema, decreased muscle bulk and tone   Chest xray reviewed - persistent left apical ptx, extensive bilateral airspace disease R>L with hyperinflation  Assessment & Plan:   Recurrent left pneumothorax in the setting of lung cancer status post endobronchial valves this will be his third pneumothorax with a chest tube placement at [facility]  He had endobronchial valves placed by Dr. Tonia Brooms early June 2024.  He has not had a formal consult by cardiovascular thoracic surgery at this time. He is now s/p 3 additional valves in LLL on 01/26/23 (Dr. Tonia Brooms) P: Unfortunately the patient continues to fail waterseal trial. There is not much more I can offer from pulmonary perspective. Would be curious to  know if oncology would even offer further chemotherapy at this point. Might be helpful in decision making. Discussed autologous blood patch - possibility it could slow the leak down but it would be likely to recur so we'd be back in the same scenario. Leave chest tube to -10 cm H20 for now.   Idiopathic pulmonary fibrosis O2 dependent at 6 L nasal cannula Continue oxygen as needed goal sats over 88%  Metastatic stage small cell lung cancer  - s/p cycle 1 carboplatin, etoposide, durvalumab -op follow up  GOC: He needs ongoing goals of care  discussions. Palliative care notes reviewed - currently full scope of care. Honestly hospice is most medically appropriate. May be worth assembling multi-disciplinary meeting with palliative care.   Will follow.  Rest per primary team.  Durel Salts, MD Pulmonary and Critical Care Medicine [facility] 01/30/2023 2:16 PM Pager: see AMION  If no response to pager, please call critical care on call (see AMION) until 7pm After 7:00 pm call Elink

## 2023-01-30 DEATH — deceased

## 2023-01-31 ENCOUNTER — Inpatient Hospital Stay: Payer: Medicaid Other

## 2023-01-31 DIAGNOSIS — J939 Pneumothorax, unspecified: Secondary | ICD-10-CM | POA: Diagnosis not present

## 2023-01-31 DIAGNOSIS — C3491 Malignant neoplasm of unspecified part of right bronchus or lung: Secondary | ICD-10-CM | POA: Diagnosis not present

## 2023-01-31 DIAGNOSIS — J84112 Idiopathic pulmonary fibrosis: Secondary | ICD-10-CM | POA: Diagnosis not present

## 2023-01-31 DIAGNOSIS — E43 Unspecified severe protein-calorie malnutrition: Secondary | ICD-10-CM | POA: Diagnosis not present

## 2023-01-31 DIAGNOSIS — J9621 Acute and chronic respiratory failure with hypoxia: Secondary | ICD-10-CM | POA: Diagnosis not present

## 2023-01-31 LAB — CULTURE, BLOOD (ROUTINE X 2): Culture: NO GROWTH

## 2023-01-31 NOTE — Progress Notes (Signed)
Palliative Care Progress Note, Assessment & Plan   Patient Name: Bradley Rice       Date: 01/31/2023 DOB: Jan 22, 1961  Age: 62 y.o. MRN#: [id number] Attending Physician: Kathlen Mody, MD Primary Care Physician: [name], FNP Admit Date: 01/28/2023  Subjective: Patient is sitting on edge of bed with nasal cannula in place in no apparent distress.  He acknowledges my presence and is able to make his wishes known.  His wife and Dr. Celine Mans were present during my visit/discussions.  HPI: 62 y.o. male  with past medical history of small cell lung cancer, recurrent left-sided pneumothorax, idiopathic pulmonary fibrosis, chronic respiratory failure on 6L oxygen at baseline, and chronic pain syndrome.  He was recently hospitalized at [facility] 12/29/2022 through 01/03/2023 for recurrent left-sided pneumothorax.  He underwent bronchoscopy on 01/02/2023 with placement of endobronchial valves.   He presented to the ED on 01/23/2023 with complaint of difficulty breathing, left-sided chest pain, and cough and was re-admitted for recurrent left pneumothorax in the setting of lung cancer.   Palliative Medicine has been consulted for goals of care.  Summary of counseling/coordination of care: After reviewing the patient's chart and assessing the patient at bedside, I spoke with patient regards to symptom management and plan of care.  Symptoms assessed.  Patient endorses his pain is 3 out of 10 at the moment.  He shares he has not been able to receive pain medicine when needed.  In review of chart, patient has not received oxycodone consistently.  I counseled with dayshift RN and encouraged her to give it every 4 unless patient refuses.  I also advised to give Xanax as patient endorses some anxiety when he feels as though he cannot  breathe.  Discussed use of IV pain medication for breakthrough/severe pain.  Patient, wife, and nurse endorsed understanding.  Dr. Celine Mans outlined patient's current medical status and complex pulmonary issues. Patient and wife given options of home with hospice. Hospice philosophy and care reviewed in detail. QUestions and concerns were addressed.  Human mortality, pain and suffering, and "giving up" reviewed.  Patient shares she does not want to give up.  He wants to "fight until he takes his last breath".  Discussed limitations of medical treatment given patient's current frailty and multiple comorbidities.  Wife is appropriately tearful.  Therapeutic silence, active listening, and emotional support provided.  Patient and wife want to discuss home with hospice option.  They agreed for PMT to follow-up with them to continue discussions tomorrow.    PMT will continue to follow.  Physical Exam Vitals reviewed.  Constitutional:      Comments: Thin  HENT:     Head:     Comments: Temporal wasting Cardiovascular:     Rate and Rhythm: Tachycardia present.  Pulmonary:     Effort: Pulmonary effort is normal.     Breath sounds: Examination of the left-middle field reveals decreased breath sounds. Examination of the right-lower field reveals decreased breath sounds. Examination of the left-lower field reveals decreased breath sounds. Decreased breath sounds present.  Chest:     Comments: Chest tube to wall sxn in place Musculoskeletal:     Comments: Generalized weakness, MAETC  Neurological:  Mental Status: He is alert and oriented to person, place, and time.  Psychiatric:        Mood and Affect: Mood normal. Mood is not anxious.        Behavior: Behavior normal. Behavior is not agitated.             Total Time 50 minutes   Katleen Carraway L. Manon Hilding, FNP-BC Palliative Medicine Team Team Phone # [phone]

## 2023-01-31 NOTE — Progress Notes (Signed)
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  He has had 3 left pneumothoraces with chest tubes times 3+ endobronchial valves placed by Dr. Tonia Brooms on the left early June or 2024.  He again presented to [facility] with chest pain and shortness of breath and was found to have a large left pneumothoraces.  He had a chest tube placed by surgical intervention at [facility].  Transferred to [facility] for further evaluation and treatment.  He is well-known to our practice.  He has never had a formal thoracic consult.  The last thoracic surgery to follow and confer with Dr. Tonia Brooms concerning further interventions.  Pertinent  Medical History   Past Medical History:  Diagnosis Date   ALS (amyotrophic lateral sclerosis) (HCC)    Asthma    Back pain    COPD (chronic obstructive pulmonary disease) (HCC)    Coronary artery calcification seen on CT scan    DDD (degenerative disc disease), lumbar    Depression    Dysphagia    Following previous surgery   History of blood transfusion 2014   Post-op bleed   History of lung cancer    History of stroke 2015   Idiopathic pulmonary fibrosis (HCC)    Migraine    Port-A-Cath in place 12/15/2022   Spider bite 2012     Significant Hospital Events: Including procedures, antibiotic start and stop dates in addition to other pertinent events   Chest tube inserted at [facility] 6/28 to endo, 3 valves placed in LLL 6/29 failed waterseal trial 6/30 moved to -10 cm of water.   Interim History / Subjective:   Still on -10 cm  H20. Still with airleak. Palliative care at bedside with wife as well Objective   Blood pressure 100/68, pulse (!) 107, temperature 97.8 F (36.6 C), temperature source Oral, resp. rate 18, height 6' (1.829 m), weight 57 kg, SpO2 95 %.        Intake/Output Summary (Last 24 hours) at 01/31/2023 1419 Last data filed at 01/31/2023 0900 Gross per 24 hour  Intake 598 ml  Output 300 ml  Net 298 ml   Filed Weights   01/29/23 0409 01/30/23 0412 01/31/23 0447  Weight: 63.8 kg 62 kg 57 kg    Examination: Thin, laying flat comfortably, no respiratory distress Tachycardic, regular On nasal cannula no edema Chest tube to -10 cm H20 suction with 3+ airleak   Chest xray reviewed - persistent left apical ptx, extensive bilateral airspace disease R>L with hyperinflation  Assessment & Plan:   Recurrent left pneumothorax in the setting of lung cancer status post endobronchial valves this will be his third pneumothorax with a chest tube placement at [facility].  He had endobronchial valves placed by Dr. Tonia Brooms early June 2024.  He has not had a formal consult by cardiovascular thoracic surgery at this time. He is now s/p 3 additional valves in LLL on 01/26/23 (Dr. Tonia Brooms) P: Unfortunately the patient continues to fail waterseal trial. There is not much more I can offer from pulmonary perspective. Would be curious to know if  oncology would even offer further chemotherapy at this point. Might be helpful in decision making. Discussed autologous blood patch - possibility it could slow the leak down but it would be likely to recur so we'd be back in the same scenario. Leave chest tube to -10 cm H20 for now.   Idiopathic pulmonary fibrosis O2 dependent at 6 L nasal cannula Continue oxygen as needed goal sats over 88%  Metastatic stage small cell lung cancer  - s/p cycle 1 carboplatin, etoposide, durvalumab -op follow up  GOC: Discussed with patient, wife and palliative care at the bedside today.  Information provided regarding home hospice. Unfortunately no meaningful therapeutic options. Recommend discharging home with hospice and setting him up with suction as well. They are discussing and we will follow up tomorrow.   I spent 35 minutes in total visit time for this patient, with more than 50% spent counseling/coordinating care.  Durel Salts, MD Pulmonary and Critical Care Medicine [facility] 01/31/2023 2:21 PM Pager: see AMION  If no response to pager, please call critical care on call (see AMION) until 7pm After 7:00 pm call Elink

## 2023-01-31 NOTE — Progress Notes (Signed)
[facility]                                                                               Bradley Rice, is a 62 y.o. male, DOB - 10/18/1960, ZOX:[id number] Admit date - 01/26/2023    Outpatient Primary MD for the patient is [name], FNP  LOS - 7  days    Brief summary   Bradley Rice is a 62 y.o. male with medical history significant for ALS, lung cancer, hypertension, idiopathic pulmonary fibrosis, chronic respiratory failure on 6 L at baseline. Patient presented to the ED with complaints of difficulty breathing, left-sided chest pain and cough that started yesterday.    Recent hospitalization 5/20 to 6/4 for recurrent left-sided pneumothorax, who was admitted at [facility] for pneumothorax and had chest tube 5/24 through 5/28.  He underwent bronchoscopy 01/02/2023 with placement of endobronchial valves.  Chest x-ray showed large left-sided pneumothorax with slight rightward shift of the mediastinum. General surgery was consulted, patient ultimately underwent chest tube placement,. EDP talked to intensivist Dr. Katrinka Blazing, recommend admission to [facility] under hospitalist service, will see in consult. Patient underwent video fiberoptic bronchoscopy with biopsies by Icard. Unfortunately patient continues to fail water seal . Pulmonology exhausted all options, recommending home hospice , set up suction.  Discussed with Dr Ellin Saba, no further chemotherapy scheduled at this time in view of his pneumothorax.    Assessment & Plan    Assessment and Plan:   * Recurrent left pneumothorax Presenting with chest pain, difficulty breathing and acute on  chronic hypoxic respiratory failure.  Chest x-ray shows recurrent large left-sided pneumo thorax with slight rightward shift of the mediastinum.    Pneumothorax in the setting of extensive stage small cell lung cancer, COPD, and IPF. He underwent endobronchial valve placement by Dr Tonia Brooms earlier this month.  - Cardiothoracic  Surgery consulted, unfortunately not a surgical candidate.   - underwent chest tube placement  on admission.  PCCM consulted, underwent bronchoscopy , showing stable valves, another 3 valves were placed.  Unfortunately patient continues to fail waterseal trial. PCCM recommending home hospice at this time.    Endobronchial cancer, right (HCC) History of extensive stage small cell lung cancer, currently on chemotherapy.  Follows with Dr. Ellin Saba.  Chest x-ray today shows persistent masslike consolidation in right lung base.  Acute on chronic respiratory failure with hypoxia (HCC) due to Idiopathic pulmonary fibrosis.  On 6 lit of [place] oxygen at home.  Initially required 100% oxygen, currently back on 6 lit of  oxygen.    Chronic pain syndrome Patient is on OxyContin 20 mg BID and oxycodone 15 mg every 6 hours prn along with fentanyl patch  at home, which were restarted, but with drop in BP, stopped the fentanyl patch.  Restarted home meds along with IV dilaudid and IV toradol.   H/o of ALS Therapy evaluations will be ordered.   Leukopenia and anemia of chronic disease Improving.   Hypotension of unclear etiology BP parameters improved with midodrine. Continue with midodrine.    RN Pressure Injury Documentation: Pressure Injury 12/29/22 Coccyx Upper Stage 2 -  Partial thickness loss of dermis presenting as a  shallow open injury with a red, pink wound bed without slough. pin point size (Active)  12/29/22 1609  Location: Coccyx  Location Orientation: Upper  Staging: Stage 2 -  Partial thickness loss of dermis presenting as a shallow open injury with a red, pink wound bed without slough.  Wound Description (Comments): pin point size  Present on Admission: Yes  Dressing Type Foam - Lift dressing to assess site every shift 01/30/23 1150   Thrombocytosis:  Reactive.     Lactic acidosis:  Resolved.    Estimated body mass index is 17.03 kg/m as calculated from the  following:   Height as of this encounter: 6' (1.829 m).   Weight as of this encounter: 57 kg.  Code Status: full code DVT Prophylaxis:  SCDs Start: 01/21/2023 2345   Level of Care: Level of care: Progressive Family Communication: none at bedside  Disposition Plan:     Remains inpatient appropriate:  further management of recurrent pneumothorax.   Procedures:  Bronchoscopy.   Consultants:   PCCM.  Cardiothoracic surgery Palliative care.    Antimicrobials:   Anti-infectives (From admission, onward)    None        Medications  Scheduled Meds:  busPIRone  5 mg Oral TID   DULoxetine  60 mg Oral Daily   famotidine  20 mg Oral BID   feeding supplement  237 mL Oral QID   midodrine  5 mg Oral TID WC   multivitamin with minerals  1 tablet Oral Daily   nortriptyline  50 mg Oral BID   sodium chloride flush  10 mL Intrapleural Q8H   Continuous Infusions: PRN Meds:.acetaminophen **OR** acetaminophen, albuterol, ALPRAZolam, alum & mag hydroxide-simeth, benzonatate, HYDROmorphone (DILAUDID) injection, mouth rinse, oxyCODONE-acetaminophen, polyethylene glycol, traZODone    Subjective:   Bradley Rice was seen and examined today.   No new events overnight.   Objective:   Vitals:   01/31/23 0447 01/31/23 0800 01/31/23 1011 01/31/23 1200  BP: 104/76 91/64 98/67  100/68  Pulse: (!) 111 (!) 113 (!) 105 (!) 107  Resp: 20 16  18   Temp: 97.8 F (36.6 C) 97.8 F (36.6 C)  97.8 F (36.6 C)  TempSrc: Oral Oral  Oral  SpO2: 100% 95% 100% 95%  Weight: 57 kg     Height:        Intake/Output Summary (Last 24 hours) at 01/31/2023 1543 Last data filed at 01/31/2023 0900 Gross per 24 hour  Intake 480 ml  Output 300 ml  Net 180 ml    Filed Weights   01/29/23 0409 01/30/23 0412 01/31/23 0447  Weight: 63.8 kg 62 kg 57 kg     Exam General exam: Appears calm and comfortable  Respiratory system: diminished air entry on the left s/p chest tube. On 6lit of [place] oxygen.  Cardiovascular  system: S1 & S2 heard, RRR.  Gastrointestinal system: Abdomen is nondistended, soft and nontender.  Central nervous system: Alert and oriented. Extremities: Symmetric 5 x 5 power. Skin: No rashes,  Psychiatry: mood is appropriate.        Data Reviewed:  I have personally reviewed following labs and imaging studies   CBC Lab Results  Component Value Date   WBC 8.7 01/30/2023   RBC 3.14 (L) 01/30/2023   HGB 9.2 (L) 01/30/2023   HCT 29.3 (L) 01/30/2023   MCV 93.3 01/30/2023   MCH 29.3 01/30/2023   PLT 488 (H) 01/30/2023   MCHC 31.4 01/30/2023   RDW 15.9 (H) 01/30/2023   LYMPHSABS 1.5  01/30/2023   MONOABS 1.5 (H) 01/30/2023   EOSABS 0.1 01/30/2023   BASOSABS 0.0 01/30/2023     Last metabolic panel Lab Results  Component Value Date   NA 139 01/30/2023   K 4.2 01/30/2023   CL 96 (L) 01/30/2023   CO2 35 (H) 01/30/2023   BUN 13 01/30/2023   CREATININE 0.73 01/30/2023   GLUCOSE 89 01/30/2023   GFRNONAA >60 01/30/2023   GFRAA >60 12/15/2019   CALCIUM 8.9 01/30/2023   PHOS 3.6 12/30/2022   PROT 7.1 01/09/2023   ALBUMIN 3.1 (L) 01/09/2023   LABGLOB 3.0 11/18/2022   AGRATIO 1.2 11/18/2022   BILITOT 0.4 01/09/2023   ALKPHOS 62 01/09/2023   AST 14 (L) 01/09/2023   ALT 19 01/09/2023   ANIONGAP 8 01/30/2023    CBG (last 3)  No results for input(s): "GLUCAP" in the last 72 hours.    Coagulation Profile: No results for input(s): "INR", "PROTIME" in the last 168 hours.   Radiology Studies: DG CHEST PORT 1 VIEW  Result Date: 01/30/2023 CLINICAL DATA:  Follow-up exam. Recurrent pneumothorax. Shortness of breath and cough. EXAM: PORTABLE CHEST 1 VIEW COMPARISON:  Chest radiograph 01/28/2023 FINDINGS: A left jugular Port-A-Cath, left-sided bronchial valves, and a left pleural drainage catheter remain in place. The cardiomediastinal silhouette is unchanged. The left-sided pneumothorax has decreased substantially in size, now small to moderate. Underlying chronic  interstitial lung disease, a right lower lobe mass, and small loculated right pleural effusion are again noted. There is increased airspace opacity in the right upper lobe. IMPRESSION: 1. Decreased size of a now small to moderate left pneumothorax. 2. Increased right upper lobe airspace disease. 3. Unchanged small loculated right pleural effusion with underlying interstitial lung disease and right lower lobe mass. Electronically Signed   By: Sebastian Ache M.D.   On: 01/30/2023 09:01       Kathlen Mody M.D. [facility] 01/31/2023, 3:43 PM  Available via Epic secure chat 7am-7pm After 7 pm, please refer to night coverage provider listed on amion.

## 2023-01-31 NOTE — Progress Notes (Signed)
Mobility Specialist Progress Note:    01/31/23 1536  Mobility  Activity Refused mobility   Attempted to see pt twice today, refused both times, reasons being back pain and having to wait to speak w/ the doctor. Will f/u as able.   Thompson Grayer Mobility Specialist  Please contact vis Secure Chat or  Rehab Office [phone]

## 2023-01-31 NOTE — Plan of Care (Signed)
  Problem: Education: Goal: Knowledge of General Education information will improve Description: Including pain rating scale, medication(s)/side effects and non-pharmacologic comfort measures Outcome: Progressing   Problem: Coping: Goal: Level of anxiety will decrease Outcome: Progressing   

## 2023-02-01 ENCOUNTER — Inpatient Hospital Stay: Payer: Medicaid Other

## 2023-02-01 DIAGNOSIS — J9621 Acute and chronic respiratory failure with hypoxia: Secondary | ICD-10-CM | POA: Diagnosis not present

## 2023-02-01 DIAGNOSIS — J939 Pneumothorax, unspecified: Secondary | ICD-10-CM | POA: Diagnosis not present

## 2023-02-01 DIAGNOSIS — J84112 Idiopathic pulmonary fibrosis: Secondary | ICD-10-CM | POA: Diagnosis not present

## 2023-02-01 DIAGNOSIS — C3491 Malignant neoplasm of unspecified part of right bronchus or lung: Secondary | ICD-10-CM | POA: Diagnosis not present

## 2023-02-01 DIAGNOSIS — Z515 Encounter for palliative care: Secondary | ICD-10-CM

## 2023-02-01 LAB — CULTURE, BLOOD (ROUTINE X 2)

## 2023-02-01 MED ORDER — ALPRAZOLAM 0.25 MG PO TABS
0.2500 mg | ORAL_TABLET | Freq: Two times a day (BID) | ORAL | Status: DC | PRN
  Administered 2023-02-01: 0.25 mg via ORAL
  Filled 2023-02-01: qty 1

## 2023-02-01 MED ORDER — HYDROMORPHONE BOLUS VIA INFUSION: 1.0000 mg | INTRAVENOUS | Status: DC | PRN

## 2023-02-01 MED ORDER — OXYCODONE HCL 5 MG/5ML PO SOLN
5.0000 mg | ORAL | Status: DC | PRN
  Administered 2023-02-01: 5 mg via ORAL
  Filled 2023-02-01 (×2): qty 5

## 2023-02-01 MED ORDER — HYDROMORPHONE HCL-NACL 50-0.9 MG/50ML-% IV SOLN
1.0000 mg/h | INTRAVENOUS | Status: DC
  Administered 2023-02-01: 1 mg/h via INTRAVENOUS
  Administered 2023-02-02: 4 mg/h via INTRAVENOUS
  Filled 2023-02-01 (×2): qty 50

## 2023-02-01 MED ORDER — LORAZEPAM 2 MG/ML IJ SOLN
1.0000 mg | INTRAMUSCULAR | Status: DC | PRN
  Administered 2023-02-01: 1 mg via INTRAVENOUS
  Filled 2023-02-01: qty 1

## 2023-02-01 MED ORDER — FENTANYL CITRATE PF 50 MCG/ML IJ SOSY
12.5000 ug | PREFILLED_SYRINGE | Freq: Once | INTRAMUSCULAR | Status: AC
  Administered 2023-02-01: 12.5 ug via INTRAVENOUS
  Filled 2023-02-01: qty 1

## 2023-02-01 MED ORDER — OXYCODONE HCL ER 10 MG PO T12A
20.0000 mg | EXTENDED_RELEASE_TABLET | Freq: Two times a day (BID) | ORAL | Status: DC
  Administered 2023-02-01: 20 mg via ORAL
  Filled 2023-02-01: qty 2

## 2023-02-01 MED ORDER — SODIUM CHLORIDE 0.9 % IV SOLN
1.0000 mg/h | INTRAVENOUS | Status: DC
  Filled 2023-02-01: qty 5

## 2023-02-01 MED ORDER — GUAIFENESIN-DM 100-10 MG/5ML PO SYRP
10.0000 mL | ORAL_SOLUTION | ORAL | Status: DC | PRN
  Administered 2023-02-01 (×2): 10 mL via ORAL
  Filled 2023-02-01 (×2): qty 10

## 2023-02-01 NOTE — Progress Notes (Signed)
PROGRESS NOTE    Bradley Rice  ZOX:[id number] DOB: May 13, 1961 DOA: 01/16/2023 PCP: [name], FNP    Brief Narrative:  62 year old gentleman with history of ALS, lung cancer, hypertension, idiopathic pulmonary fibrosis, chronic hypoxemic respiratory failure on 6 L oxygen at baseline presented to the hospital with difficulty breathing, left-sided chest pain and cough of 1 day. Hospitalization 5/20-6/4, recurrent left-sided pneumothorax, chest tube management, bronchoscopy and placement of endobronchial valves. Chest x-ray showed large left-sided pneumothorax, surgery consulted.  Patient underwent chest tube placement.  Underwent bronchoscopy with biopsies, patient was not able to come off waterseal.  Pulmonary exhausted all options and recommended home hospice. Oncology does not offer any further chemotherapy due to persistent pneumothorax and cancer related pain.   Assessment & Plan:   Recurrent left pneumothorax: Presented with chest pain, difficulty breathing and acute on chronic hypoxemic respiratory failure. Multiple endobronchial procedures and valve placement, CT surgery recommended, not a surgical candidate. Currently chest tube managed, continues to fail waterseal trial.  Given his underlying history of pulmonary fibrosis, pain and lung cancer recommended transition to home with hospice. Appreciate pulmonary input.  Will need to set up suction at home when decides to go home.  Endobronchial cancer, extensive stage small cell lung cancer currently on chemotherapy.  Severe chest pain difficulty breathing.  Acute on chronic hypoxemic failure. Patient currently on 6 to 8 L of oxygen. Patient does have history of chronic pain syndrome as well as now cancer related pain. Inadequate management of pain today. Fentanyl patch was discontinued as he did not like it. Resume oxycodone 20 mg twice daily, patient not tolerating oral tablets, started on Roxicodone liquid. Start Xanax 0.25  mg twice daily. Appreciate palliative care input. Patient is appropriate for transition to hospice, ongoing discussion.  Continue current management.  History of ALS Leukopenia and anemia of chronic disease Hypotension which is multifactorial.  On midodrine. Pressure injury coccyx, stage II.  Present on admission. Severe protein calorie malnutrition Nutrition Status: Nutrition Problem: Severe Malnutrition Etiology: chronic illness, cancer and cancer related treatments Signs/Symptoms: severe fat depletion, severe muscle depletion, percent weight loss Percent weight loss: 17.7 % Interventions: Ensure Enlive (each supplement provides 350kcal and 20 grams of protein), MVI, Magic cup, Education       DVT prophylaxis: SCDs Start: 01/11/2023 2345   Code Status: Full code.  CODE STATUS discussed ongoing. Family Communication: Wife at the side Disposition Plan: Status is: Inpatient Remains inpatient appropriate because: Critically ill, transition to home with hospice discussion ongoing     Consultants:  Pulmonary Palliative CT surgery  Procedures:  Multiple procedures, chest tube placement, endobronchial biopsy and stenting  Antimicrobials:  None   Subjective: Patient seen and examined in the morning rounds.  Patient tells me that he choked on Percocet and now hurting on his chest.  Cannot cough.  Offered him injectable Dilaudid and subsequently liquid form of oxycodone that he will try.  Does not want to try fentanyl patch. Wife at the bedside, we had detailed discussion and also updated palliative care team.  They are ready to go home with hospice program.  Patient wants to focus on his pain control, wants to continue keeping his chest tube in place.  They are aware that treatment options are exhausted. Discussed about DNR status, advised that any attempt at resuscitation in case if he suffers from cardiopulmonary arrest will not be protocol and may be more harmful.  He  understands.  He wants to discuss with his kids and  let us know about changing his CODE STATUS.  Objective: Vitals:   02/01/23 1000 02/01/23 1042 02/01/23 1141 02/01/23 1142  BP:    134/81  Pulse: (!) 124 (!) 134 (!) 131 (!) 129  Resp:    18  Temp:    98.4 F (36.9 C)  TempSrc:    Oral  SpO2: 95% 92% 95% 96%  Weight:      Height:        Intake/Output Summary (Last 24 hours) at 02/01/2023 1242 Last data filed at 02/01/2023 0800 Gross per 24 hour  Intake 250 ml  Output 220 ml  Net 30 ml   Filed Weights   01/30/23 0412 01/31/23 0447 02/01/23 0504  Weight: 62 kg 57 kg 56.8 kg    Examination:  General exam: Appears calm and comfortable and appropriately anxious on communication. Respiratory system: Poor air entry at the bases.  He is on 6 L of oxygen.  He is able to talk in complete sentences.  Poor inspiratory effort. Left lateral chest wall chest tube present. Cardiovascular system: S1 & S2 heard, RRR. No pedal edema. Gastrointestinal system: Abdomen is nondistended, soft and nontender. No organomegaly or masses felt. Normal bowel sounds heard. Central nervous system: Alert and oriented. No focal neurological deficits.    Data Reviewed: I have personally reviewed following labs and imaging studies  CBC: Recent Labs  Lab 01/28/23 0048 01/30/23 0038  WBC 2.7* 8.7  NEUTROABS 1.9 4.9  HGB 8.1* 9.2*  HCT 26.2* 29.3*  MCV 90.7 93.3  PLT 411* 488*   Basic Metabolic Panel: Recent Labs  Lab 01/28/23 0048 01/30/23 0038  NA 141 139  K 4.2 4.2  CL 99 96*  CO2 37* 35*  GLUCOSE 180* 89  BUN 7 13  CREATININE 0.59* 0.73  CALCIUM 9.1 8.9   GFR: Estimated Creatinine Clearance: 78.9 mL/min (by C-G formula based on SCr of 0.73 mg/dL). Liver Function Tests: No results for input(s): "AST", "ALT", "ALKPHOS", "BILITOT", "PROT", "ALBUMIN" in the last 168 hours. No results for input(s): "LIPASE", "AMYLASE" in the last 168 hours. No results for input(s): "AMMONIA" in the last  168 hours. Coagulation Profile: No results for input(s): "INR", "PROTIME" in the last 168 hours. Cardiac Enzymes: No results for input(s): "CKTOTAL", "CKMB", "CKMBINDEX", "TROPONINI" in the last 168 hours. BNP (last 3 results) No results for input(s): "PROBNP" in the last 8760 hours. HbA1C: No results for input(s): "HGBA1C" in the last 72 hours. CBG: No results for input(s): "GLUCAP" in the last 168 hours. Lipid Profile: No results for input(s): "CHOL", "HDL", "LDLCALC", "TRIG", "CHOLHDL", "LDLDIRECT" in the last 72 hours. Thyroid Function Tests: No results for input(s): "TSH", "T4TOTAL", "FREET4", "T3FREE", "THYROIDAB" in the last 72 hours. Anemia Panel: No results for input(s): "VITAMINB12", "FOLATE", "FERRITIN", "TIBC", "IRON", "RETICCTPCT" in the last 72 hours. Sepsis Labs: Recent Labs  Lab 01/27/23 1854 01/27/23 2112  LATICACIDVEN 2.1* 1.4    Recent Results (from the past 240 hour(s))  Culture, blood (Routine X 2) w Reflex to ID Panel     Status: None   Collection Time: 01/27/23  6:54 PM   Specimen: BLOOD LEFT HAND  Result Value Ref Range Status   Specimen Description BLOOD LEFT HAND  Final   Special Requests   Final    BOTTLES DRAWN AEROBIC ONLY Blood Culture adequate volume   Culture   Final    NO GROWTH 5 DAYS Performed at [facility], [address], [place], [place] [address]  Report Status 02/01/2023 FINAL  Final  Culture, blood (Routine X 2) w Reflex to ID Panel     Status: None   Collection Time: 01/27/23  6:54 PM   Specimen: BLOOD LEFT HAND  Result Value Ref Range Status   Specimen Description BLOOD LEFT HAND  Final   Special Requests   Final    BOTTLES DRAWN AEROBIC AND ANAEROBIC Blood Culture adequate volume   Culture   Final    NO GROWTH 5 DAYS Performed at [facility], [address] [place], [place] [address]    Report Status 02/01/2023 FINAL  Final         Radiology Studies: No results found.      Scheduled Meds:   busPIRone  5 mg Oral TID   DULoxetine  60 mg Oral Daily   famotidine  20 mg Oral BID   feeding supplement  237 mL Oral QID   midodrine  5 mg Oral TID WC   multivitamin with minerals  1 tablet Oral Daily   nortriptyline  50 mg Oral BID   oxyCODONE  20 mg Oral Q12H   sodium chloride flush  10 mL Intrapleural Q8H   Continuous Infusions:   LOS: 8 days    Time spent: 40 minutes    Dorcas Carrow, MD [facility]

## 2023-02-01 NOTE — Plan of Care (Signed)
  Problem: Pain Managment: Goal: General experience of comfort will improve Outcome: Progressing   

## 2023-02-01 NOTE — Progress Notes (Signed)
I responded to Spiritual Consultation request from the nurse to provide spiritual support for the patient's family. I arrived at the patient's room where his wife and other family members were present. I provided spiritual support through pastoral presence, by reading scripture, and leading in prayer.    02/01/23 1750  Spiritual Encounters  Type of Visit Initial  Care provided to: Pt and family  Conversation partners present during encounter Nurse  Referral source Nurse (RN/NT/LPN)  Reason for visit Urgent spiritual support  OnCall Visit Yes  Spiritual Framework  Patient Stress Factors Exhausted  Family Stress Factors Exhausted  Interventions  Spiritual Care Interventions Made Compassionate presence;Prayer;Encouragement    Chaplain Dr Melvyn Novas

## 2023-02-01 NOTE — Progress Notes (Signed)
Discussed with primary team and palliative care. Arranged with floor staff to order mini-express chest wall system. This will facilitate disposition either to outpatient hospice or home hospice. Please call if we can be of further assistance.   Durel Salts, MD Pulmonary and Critical Care Medicine [facility] 02/01/2023 5:33 PM Pager: see AMION  If no response to pager, please call critical care on call (see AMION) until 7pm After 7:00 pm call Elink

## 2023-02-01 NOTE — Progress Notes (Signed)
Palliative Care Progress Note, Assessment & Plan   Patient Name: Bradley Rice       Date: 02/01/2023 DOB: [birth year]  Age: 62 y.o. MRN#: [id number] Attending Physician: Dorcas Carrow, MD Primary Care Physician: [name], FNP Admit Date: 01/05/2023  Subjective: Patient is alert and oriented X 4. He acknowledge my presence and is able to make his wishes known. He endorses 10/10 pain, that his nerves are "shot" and that he did not sleep at all last night. His wife is at bedside.   HPI: 62 y.o. male  with past medical history of small cell lung cancer, recurrent left-sided pneumothorax, idiopathic pulmonary fibrosis, chronic respiratory failure on 6L oxygen at baseline, and chronic pain syndrome.  He was recently hospitalized at [facility] 12/29/2022 through 01/03/2023 for recurrent left-sided pneumothorax.  He underwent bronchoscopy on 01/02/2023 with placement of endobronchial valves.   He presented to the ED on 01/02/2023 with complaint of difficulty breathing, left-sided chest pain, and cough and was re-admitted for recurrent left pneumothorax in the setting of lung cancer.   Palliative Medicine has been consulted for goals of care.  Summary of counseling/coordination of care: After reviewing the patient's chart and assessing the patient at bedside, I spoke with patient and his wife in regards to symptom management and goals of care.   Multiple conversations had throughout the day in regards to symptom management. Fentanyl, Toradol, OxyContin, Oxy IR, Xanax, Dilaudid IV, Tessalon Perles, and Robitussin given at varying times this AM without relief of symptoms.Given the various modalities of pain medications have been administered without relief of symptoms, I initiated a Dilaudid gtt.  Education on Dilaudid infusion  with boluses given to patient and wife at bedside.       Dosing was limited given patient's soft blood pressure and need to protect his airway in case of respiratory depression given patient is a full code.  Discussed numerous times that parameters remain in place for symptom management as long as patient is accepting of all offered, available, and appropriate medical interventions to sustain his life-full code.  Full code versus DNR reviewed.  Patient states that he would never want his family to see him on a ventilator.  He says he would never want his ribs to be bruised or broken.  He appreciates that his lung is already compromised and believes that it would not be able to recover if he were to require cardiopulmonary resuscitation.  Patient was hesitant at the beginning of the day to discuss CODE STATUS.  However, later this afternoon he shared that he is "tired" and "just wants relief from pain".  He endorsed that he is accepting of DNR/DNI status and wishes to move forward with focusing solely on medications/interventions focused for comfort.   Patient states he does not want to be "poked anymore". Comfort measures reviewed in full detail with patient and wife at bedside. He endorses that he becomes anxious when he feels like he cannot breath or when his wife is not at bedside.  Therapeutic silence, active listening, and emotional support provided. Discussed IV Ativan to address patient's anxiety can be given Q4H PRN.   Patient shares that he had great difficulty swallowing pills and  felt like he was going to "choke to death" when he attempted to swallow pills this AM.  Reviewed that medications will be adjusted to avoid POs and given IV.    I also recommend comfort feeds only.  Patient shares he is not hungry and disinterested in food at this time.  However, patient can have floor stock items or any food from home that he wishes.  Discussed with patient that we will monitor him over the next 24  hours and if stable and symptoms controlled then discharge to home with hospice or hospice facility can then be reviewed. Pt is resistant to being discharged to hospice IPU, but was willing to listen to reasoning behind transferring there versus home. I outline need for aggressive symptom management with IV medications and his high symptom burden as well as oxygen demands and monitoring of chest tube. He shares he will think about it and discuss it again tomorrow.   Patient and wife shared that if/when patient is stable to transfer out of the hospital they would like to work with [facility].   Conveyed above to Attending Dr. Jerral Ralph, [facility], and dayshift RN Byrd Hesselbach.   PMT will continue to follow/support.   Physical Exam Constitutional:      General: He is in acute distress.     Appearance: He is ill-appearing.     Comments: Frail, thin  HENT:     Head:     Comments: Temporal wasting Cardiovascular:     Rate and Rhythm: Tachycardia present.  Pulmonary:     Breath sounds: Examination of the left-upper field reveals decreased breath sounds. Examination of the left-middle field reveals decreased breath sounds. Examination of the right-lower field reveals decreased breath sounds. Examination of the left-lower field reveals decreased breath sounds. Decreased breath sounds present.  Skin:    General: Skin is warm and dry.     Coloration: Skin is pale.  Neurological:     Mental Status: He is alert.  Psychiatric:        Mood and Affect: Mood is anxious.        Behavior: Behavior normal.             Total Time 120 minutes   Sowmya Partridge L. Manon Hilding, FNP-BC Palliative Medicine Team Team Phone # [phone]

## 2023-02-01 NOTE — Progress Notes (Signed)
Nutrition Brief Note  Chart reviewed. Pt now transitioning to comfort care.  No further nutrition interventions planned at this time.  Please re-consult as needed.   Allie Shante Maysonet, RDN, LDN Clinical Nutrition  

## 2023-02-01 NOTE — TOC Progression Note (Addendum)
Transition of Care [facility]) - Progression Note    Patient Details  Name: Bradley Rice MRN: [id number] Date of Birth: 02/15/1961  Transition of Care [facility]) CM/SW Contact  [name], RN Phone Number: 02/01/2023, 4:07 PM  Clinical Narrative:    NCM received consult from palliative for [facility] of [facility] for family, they would like to speak to a rep there to determine if they want to do home hospice etc.  NCM contacted KeKa with [facility] and she will be calling the wife [name] to speak with and and then will call this NCM back to let know what she decided.   1619- NCM received call back from [facility] at [facility], she states she spoke with wife, and wife was asking about patient going to a facility so they talked about that and also talked about what it would look like for him to go home with hospice, but wife has not made up her mind yet about hospice. KeKa will check with the covering NCM tomorrow to see if wife has made a decision.   Expected Discharge Plan: Home/Self Care Barriers to Discharge: Continued Medical Work up  Expected Discharge Plan and Services In-house Referral: NA Discharge Planning Services: CM Consult Post Acute Care Choice: NA Living arrangements for the past 2 months: Single Family Home                 DME Arranged: N/A DME Agency: NA       HH Arranged: NA           Social Determinants of Health (SDOH) Interventions SDOH Screenings   Food Insecurity: No Food Insecurity (01/04/2023)  Housing: Low Risk  (01/04/2023)  Transportation Needs: No Transportation Needs (01/17/2023)  Utilities: Not At Risk (01/04/2023)  Depression (PHQ2-9): Medium Risk (01/12/2023)  Financial Resource Strain: Medium Risk (01/17/2023)  Physical Activity: Unknown (11/18/2022)  Social Connections: Unknown (11/18/2022)  Stress: Stress Concern Present (11/18/2022)  Tobacco Use: Medium Risk (01/30/2023)    Readmission Risk Interventions      No data to display

## 2023-02-02 DIAGNOSIS — J9381 Chronic pneumothorax: Secondary | ICD-10-CM

## 2023-02-02 DIAGNOSIS — E43 Unspecified severe protein-calorie malnutrition: Secondary | ICD-10-CM

## 2023-02-03 ENCOUNTER — Inpatient Hospital Stay: Payer: Medicaid Other

## 2023-02-07 ENCOUNTER — Ambulatory Visit: Payer: 59 | Admitting: *Deleted

## 2023-02-13 ENCOUNTER — Ambulatory Visit: Payer: Medicaid Other | Admitting: Internal Medicine

## 2023-02-16 ENCOUNTER — Ambulatory Visit: Payer: 59 | Admitting: Pulmonary Disease

## 2023-02-20 ENCOUNTER — Inpatient Hospital Stay: Payer: Medicaid Other

## 2023-02-20 ENCOUNTER — Inpatient Hospital Stay: Payer: Medicaid Other | Admitting: Hematology

## 2023-02-21 ENCOUNTER — Inpatient Hospital Stay: Payer: Medicaid Other

## 2023-02-22 ENCOUNTER — Inpatient Hospital Stay: Payer: Medicaid Other

## 2023-03-02 NOTE — Death Summary Note (Signed)
DEATH SUMMARY   Patient Details  Name: Bradley Rice MRN: [id number] DOB: 1961-06-29 WUJ:[id number], Edilia Bo, FNP Admission/Discharge Information   Admit Date:  11-Feb-2023  Date of Death: Date of Death: 02-20-23  Time of Death: Time of Death: 0329  Length of Stay: 9   Principle Cause of death: Pneumothorax secondary to endobronchial cancer.  Hospital Diagnoses: Principal Problem:   Pneumothorax Active Problems:   Endobronchial cancer, right (HCC)   Acute on chronic respiratory failure with hypoxia (HCC)   Depression   Chronic pain   IPF (idiopathic pulmonary fibrosis) (HCC)   Protein-calorie malnutrition, severe   Palliative care by specialist   Hospital Course: 62 year old gentleman with history of ALS, lung cancer, hypertension, idiopathic pulmonary fibrosis, chronic hypoxemic respiratory failure on 6 L oxygen at baseline presented to the hospital with difficulty breathing, left-sided chest pain and cough of 1 day. Hospitalization 5/20-6/4, recurrent left-sided pneumothorax, chest tube management, bronchoscopy and placement of endobronchial valves. Chest x-ray showed large left-sided pneumothorax, surgery consulted.  Patient underwent chest tube placement.  Underwent bronchoscopy with biopsies, patient was not able to come off waterseal.  Pulmonary exhausted all options and recommended home hospice. Oncology does not offer any further chemotherapy due to persistent pneumothorax and cancer related pain.    # Endobronchial cancer, extensive stage small cell lung cancer currently on chemotherapy.  Severe chest pain difficulty breathing.  Acute on chronic hypoxemic failure. Patient with history of chronic pain syndrome as well as now cancer related pain.  Patient remained in very poor clinical status.  He had uncontrolled pain.  Had developed severe protein calorie malnutrition.  His oral intake was not adequate and he was suffering with pain and distress. 7/3, multidisciplinary  approach, palliative and hospice approach was initiated.  He was started on comfort care pathway and ultimately died in the hospital under comfort care with family at the bedside peacefully.        Procedures: Multiple endoscopies, endobronchial biopsies,  Consultations: Critical care, CT surgery, palliative and hospice  The results of significant diagnostics from this hospitalization (including imaging, microbiology, ancillary and laboratory) are listed below for reference.   Significant Diagnostic Studies: DG CHEST PORT 1 VIEW  Result Date: 01/30/2023 CLINICAL DATA:  Follow-up exam. Recurrent pneumothorax. Shortness of breath and cough. EXAM: PORTABLE CHEST 1 VIEW COMPARISON:  Chest radiograph 01/28/2023 FINDINGS: A left jugular Port-A-Cath, left-sided bronchial valves, and a left pleural drainage catheter remain in place. The cardiomediastinal silhouette is unchanged. The left-sided pneumothorax has decreased substantially in size, now small to moderate. Underlying chronic interstitial lung disease, a right lower lobe mass, and small loculated right pleural effusion are again noted. There is increased airspace opacity in the right upper lobe. IMPRESSION: 1. Decreased size of a now small to moderate left pneumothorax. 2. Increased right upper lobe airspace disease. 3. Unchanged small loculated right pleural effusion with underlying interstitial lung disease and right lower lobe mass. Electronically Signed   By: Sebastian Ache M.D.   On: 01/30/2023 09:01   DG CHEST PORT 1 VIEW  Result Date: 01/28/2023 CLINICAL DATA:  Follow-up pneumothorax EXAM: PORTABLE CHEST 1 VIEW COMPARISON:  01/28/2023, 01/27/2023, chest CT 12/29/2022 FINDINGS: Left-sided central venous port tip at the cavoatrial region. Left-sided pleural drainage catheter similar in position. Interval increase in size left pneumothorax, now moderate to large in size with mild shift of mediastinal contents to the right. Small loculated right  pleural effusion. Underlying interstitial lung disease and right lower lobe lung mass as seen  on previous imaging. Left-sided bronchial valves as before. IMPRESSION: 1. Interval increase in size of left pneumothorax, now moderate to large in size with mild shift of mediastinal contents to the right. Left pleural drainage catheter similar in position. 2. Small loculated right pleural effusion. Underlying interstitial lung disease and right lower lobe lung mass. Critical Value/emergent results were called by telephone at the time of interpretation on 01/28/2023 at 5:02 pm to provider Dr. Blake Divine, Who verbally acknowledged these results. Electronically Signed   By: Jasmine Pang M.D.   On: 01/28/2023 17:02   DG Chest Port 1 View  Result Date: 01/28/2023 CLINICAL DATA:  Pneumothorax. EXAM: PORTABLE CHEST 1 VIEW COMPARISON:  Chest radiographs 01/27/2023 (multiple studies), 01/26/2023; CT chest 12/29/2022 FINDINGS: Left internal jugular port a catheter tip again overlies the superior right atrium, unchanged. Small caliber apparent left-sided chest tube again overlies the left mid to lower lung. Small left apical pneumothorax is unchanged. High-grade right-greater-than-left interstitial thickening and coarse reticular opacities again in keeping with fibrotic interstitial lung disease. Heterogeneous opacification of the right lower lung corresponding to the known right basilar bronchogenic neoplasm. Likely small right pleural effusion, unchanged. Bronchovascular again overlie the left hilum. Cardiac silhouette and mediastinal contours are unchanged, noting confluent mediastinal lymphadenopathy on prior 12/29/2022 CT. Lower cervical spine disc prosthesis again noted. IMPRESSION: 1. Small caliber left-sided chest tube again overlies the left mid to lower lung. Small left apical pneumothorax is unchanged. 2. High-grade right-greater-than-left interstitial thickening and coarse reticular opacities again in keeping with  fibrotic interstitial lung disease. Heterogeneous opacification of the right lower lung corresponding to the known right basilar bronchogenic neoplasm. Electronically Signed   By: Neita Garnet M.D.   On: 01/28/2023 11:46   DG Chest Port 1 View  Result Date: 01/27/2023 CLINICAL DATA:  Pneumothorax. EXAM: PORTABLE CHEST 1 VIEW COMPARISON:  01/27/2023 1:54 p.m. FINDINGS: Left IJ Port-A-Cath unchanged. Small caliber mid to lower left-sided pleural drainage catheter unchanged. Lungs are adequately inflated. There is no change in known small left-sided pneumothorax. Persistent patchy airspace opacification over the right lung with hazy patchy opacification over the left mid to lower lung. Small right-sided effusion unchanged. Stable masslike opacity over the right lower lobe likely representing patient's known lung cancer. Cardiomediastinal silhouette and remainder of the exam is unchanged. IMPRESSION: 1. No change in known small left-sided pneumothorax with small caliber left-sided pleural drainage catheter unchanged. 2. Persistent patchy airspace opacification over the right lung with hazy patchy opacification over the left mid to lower lung. Small right-sided effusion unchanged. Stable masslike opacity over the right lower lobe likely patient's known lung cancer. Electronically Signed   By: Elberta Fortis M.D.   On: 01/27/2023 18:33   DG CHEST PORT 1 VIEW  Result Date: 01/27/2023 CLINICAL DATA:  Pneumothorax EXAM: PORTABLE CHEST 1 VIEW COMPARISON:  01/27/2023 FINDINGS: Decreased size of previously noted left pneumothorax, now small, similar to the 01/26/2023 exam. No significant residual mediastinal shift. Redemonstrated right-sided pleural effusion. Overall stable diffuse pulmonary opacities, right greater than left, with right lower lung mass and right hilar lymphadenopathy. Redemonstrated left pleural catheter. No acute osseous abnormality. IMPRESSION: 1. Decreased size of previously noted left pneumothorax,  now small, similar to the 01/26/2023 exam. No significant residual mediastinal shift. 2. Redemonstrated right-sided pleural effusion, diffuse interstitial lung disease, and right lower lobe mass. Electronically Signed   By: Wiliam Ke M.D.   On: 01/27/2023 14:28   DG CHEST PORT 1 VIEW  Result Date: 01/27/2023 CLINICAL DATA:  Air leak.  EXAM: PORTABLE CHEST 1 VIEW COMPARISON:  X-ray 01/26/2023 FINDINGS: Increasing now large left-sided pneumothorax. There is a catheter in place. Increasing opacity of both lungs with some shift of the mediastinum from left-to-right. Right-sided effusion again seen. Normal cardiopericardial silhouette. Bronchial valves identified along the left hilum. Stable left IJ chest port. Overlapping cardiac leads. IMPRESSION: Increasing now large left-sided pneumothorax with slight shift of the mediastinum from left-to-right. Increasing bilateral lung opacities. Critical Value/emergent results were called by telephone at the time of interpretation on 01/27/2023 at 10.21 Am PST to provider Dr. Blake Divine, Who verbally acknowledged these results. Electronically Signed   By: Karen Kays M.D.   On: 01/27/2023 13:21   DG Chest Port 1 View  Result Date: 01/26/2023 CLINICAL DATA:  Left pneumothorax.  Right lung carcinoma. EXAM: PORTABLE CHEST 1 VIEW COMPARISON:  01/23/2023 FINDINGS: Small approximately 10% left pneumothorax shows no significant change in size. Left pleural catheter remains in place. Endobronchial stent seen in the central left upper lobe. Left-sided Port-A-Cath remains in appropriate position. Right lower lobe mass and right hilar lymphadenopathy show no significant change allowing for differences in radiographic technique. Small right pleural effusion also stable. Diffuse interstitial lung disease is unchanged. IMPRESSION: No significant change in small approximately 10% left pneumothorax. Left pleural catheter remains in appropriate position. Stable right lower lobe mass, right  hilar lymphadenopathy, and small right pleural effusion. Stable diffuse interstitial lung disease. Electronically Signed   By: Danae Orleans M.D.   On: 01/26/2023 08:17   DG Chest Port 1 View  Result Date: 01/28/2023 CLINICAL DATA:  Chest tube placement, left pneumothorax EXAM: PORTABLE CHEST 1 VIEW COMPARISON:  01/11/2023 FINDINGS: Single frontal view of the chest demonstrates stable left chest wall port. Interval placement of a left chest tube with tip overlying left hilum, with near complete resolution of the left pneumothorax seen previously. Estimated less than 10% residual left apical pneumothorax, with pleural separation measuring 10 mm. Cardiac silhouette is stable. Extensive background scarring and fibrosis again noted. Rounded masslike consolidation right lung base unchanged. Small right pleural effusion versus pleural thickening. No acute fracture. IMPRESSION: 1. Near complete resolution of left pneumothorax after left chest tube placement. Estimated less than 10% residual left pneumothorax. 2. Stable background scarring and fibrosis, with persistent masslike consolidation at the right lung base. Electronically Signed   By: Sharlet Salina M.D.   On: 01/11/2023 17:32   DG Chest Port 1 View  Result Date: 01/03/2023 CLINICAL DATA:  [id number] SOB (shortness of breath) [id number] EXAM: PORTABLE CHEST - 1 VIEW COMPARISON:  01/03/2023 FINDINGS: Large left pneumothorax with slight rightward shift of the mediastinum, new since previous. Stable left subclavian port catheter to the proximal right atrium. Progressive coarse interstitial opacities throughout both lungs. 2 left suprahilar endobronchial valves as before. Heart size difficult to assess due to adjacent opacities. Blunting of the right lateral costophrenic angle suggesting small effusion. Cervical fixation hardware partially visualized. IMPRESSION: 1. Large left pneumothorax with slight rightward shift of the mediastinum. Critical Value/emergent results  were called by telephone at the time of interpretation on 12/31/2022 at 4:25 pm to provider JOSEPH ZAMMIT , who verbally acknowledged these results. 2. Progressive coarse interstitial opacities throughout both lungs. Electronically Signed   By: Corlis Leak M.D.   On: 01/14/2023 16:26   DG CHEST PORT 1 VIEW  Result Date: 01/03/2023 CLINICAL DATA:  Follow-up EXAM: PORTABLE CHEST 1 VIEW COMPARISON:  01/03/2023, 5:52 a.m. FINDINGS: Interval removal of a left-sided pigtail chest tube. No significant change in  a small, no greater than 10% left apical pneumothorax. Unchanged dense, masslike consolidation of the right lung base. Background of mild, diffuse bilateral interstitial pulmonary opacity and emphysema. Heart and mediastinum are normal in size. Left chest port catheter. Osseous structures unremarkable. IMPRESSION: 1. Interval removal of a left-sided pigtail chest tube. No significant change in a small, no greater than 10% left apical pneumothorax. 2. Unchanged dense, masslike consolidation of the right lung base. 3. Background of mild, diffuse bilateral interstitial pulmonary opacity and emphysema. Electronically Signed   By: Jearld Lesch M.D.   On: 01/03/2023 15:01    Microbiology: Recent Results (from the past 240 hour(s))  Culture, blood (Routine X 2) w Reflex to ID Panel     Status: None   Collection Time: 01/27/23  6:54 PM   Specimen: BLOOD LEFT HAND  Result Value Ref Range Status   Specimen Description BLOOD LEFT HAND  Final   Special Requests   Final    BOTTLES DRAWN AEROBIC ONLY Blood Culture adequate volume   Culture   Final    NO GROWTH 5 DAYS Performed at [facility], [address], [place], [place] [address]    Report Status 02/01/2023 FINAL  Final  Culture, blood (Routine X 2) w Reflex to ID Panel     Status: None   Collection Time: 01/27/23  6:54 PM   Specimen: BLOOD LEFT HAND  Result Value Ref Range Status   Specimen Description BLOOD LEFT HAND  Final   Special Requests    Final    BOTTLES DRAWN AEROBIC AND ANAEROBIC Blood Culture adequate volume   Culture   Final    NO GROWTH 5 DAYS Performed at [facility], [address], [place], [place] [address]    Report Status 02/01/2023 FINAL  Final    Time spent: 0 minutes  Signed: Dorcas Carrow, MD 02/10/2023

## 2023-03-02 NOTE — Progress Notes (Signed)
Pt. Without heart sounds upon auscultation. No palpable pulses. Family at the bedside. On call for [facility] paged to make aware. Pts. Time of death 28.

## 2023-03-02 NOTE — Progress Notes (Cosign Needed Addendum)
22 mL of Dilaudid wasted in stericycle. KW 50 mL of Dilaudid wasted in stericycle.KW  Waste witnessed by Vianne Bulls, RN.

## 2023-03-02 DEATH — deceased

## 2023-04-14 ENCOUNTER — Ambulatory Visit: Payer: Medicaid Other | Admitting: Family
